# Patient Record
Sex: Female | Born: 1945
Health system: Southern US, Community
[De-identification: ages and names within clinical notes are randomized; demographics above are authoritative.]

## PROBLEM LIST (undated history)

## (undated) DIAGNOSIS — Z860101 Personal history of adenomatous and serrated colon polyps: Secondary | ICD-10-CM

## (undated) DIAGNOSIS — K219 Gastro-esophageal reflux disease without esophagitis: Secondary | ICD-10-CM

## (undated) DIAGNOSIS — K589 Irritable bowel syndrome without diarrhea: Secondary | ICD-10-CM

## (undated) DIAGNOSIS — Z803 Family history of malignant neoplasm of breast: Secondary | ICD-10-CM

## (undated) DIAGNOSIS — M810 Age-related osteoporosis without current pathological fracture: Secondary | ICD-10-CM

## (undated) DIAGNOSIS — E559 Vitamin D deficiency, unspecified: Secondary | ICD-10-CM

## (undated) DIAGNOSIS — T7840XA Allergy, unspecified, initial encounter: Secondary | ICD-10-CM

## (undated) DIAGNOSIS — E785 Hyperlipidemia, unspecified: Secondary | ICD-10-CM

## (undated) DIAGNOSIS — H539 Unspecified visual disturbance: Secondary | ICD-10-CM

## (undated) DIAGNOSIS — N840 Polyp of corpus uteri: Secondary | ICD-10-CM

## (undated) DIAGNOSIS — O021 Missed abortion: Secondary | ICD-10-CM

## (undated) DIAGNOSIS — M1990 Unspecified osteoarthritis, unspecified site: Secondary | ICD-10-CM

## (undated) DIAGNOSIS — K5792 Diverticulitis of intestine, part unspecified, without perforation or abscess without bleeding: Secondary | ICD-10-CM

## (undated) DIAGNOSIS — Z86010 Personal history of colonic polyps: Secondary | ICD-10-CM

## (undated) HISTORY — PX: DILATION AND CURETTAGE OF UTERUS: SHX78

## (undated) HISTORY — DX: Diverticulitis of intestine, part unspecified, without perforation or abscess without bleeding: K57.92

## (undated) HISTORY — PX: DIAGNOSTIC LAPAROSCOPY: SUR761

## (undated) HISTORY — DX: Personal history of colonic polyps: Z86.010

## (undated) HISTORY — PX: WISDOM TOOTH EXTRACTION: SHX21

## (undated) HISTORY — DX: Age-related osteoporosis without current pathological fracture: M81.0

## (undated) HISTORY — DX: Unspecified visual disturbance: H53.9

## (undated) HISTORY — DX: Personal history of adenomatous and serrated colon polyps: Z86.0101

## (undated) HISTORY — DX: Gastro-esophageal reflux disease without esophagitis: K21.9

## (undated) HISTORY — DX: Polyp of corpus uteri: N84.0

## (undated) HISTORY — DX: Family history of malignant neoplasm of breast: Z80.3

## (undated) HISTORY — DX: Allergy, unspecified, initial encounter: T78.40XA

## (undated) HISTORY — PX: COLONOSCOPY: SHX174

## (undated) HISTORY — DX: Hyperlipidemia, unspecified: E78.5

## (undated) HISTORY — DX: Vitamin D deficiency, unspecified: E55.9

---
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
## 1999-11-07 ENCOUNTER — Encounter: Admission: RE | Admit: 1999-11-07 | Discharge: 1999-11-07 | Payer: Self-pay | Admitting: *Deleted

## 1999-11-07 ENCOUNTER — Encounter: Payer: Self-pay | Admitting: *Deleted

## 2000-10-29 ENCOUNTER — Emergency Department (HOSPITAL_COMMUNITY): Admission: EM | Admit: 2000-10-29 | Discharge: 2000-10-30 | Payer: Self-pay | Admitting: Emergency Medicine

## 2000-10-30 ENCOUNTER — Encounter: Payer: Self-pay | Admitting: Emergency Medicine

## 2000-11-11 ENCOUNTER — Encounter: Payer: Self-pay | Admitting: *Deleted

## 2000-11-11 ENCOUNTER — Encounter: Admission: RE | Admit: 2000-11-11 | Discharge: 2000-11-11 | Payer: Self-pay | Admitting: *Deleted

## 2001-11-11 ENCOUNTER — Encounter: Admission: RE | Admit: 2001-11-11 | Discharge: 2001-11-11 | Payer: Self-pay | Admitting: *Deleted

## 2001-11-11 ENCOUNTER — Encounter: Payer: Self-pay | Admitting: *Deleted

## 2002-11-07 ENCOUNTER — Encounter: Admission: RE | Admit: 2002-11-07 | Discharge: 2002-11-07 | Payer: Self-pay | Admitting: *Deleted

## 2002-11-07 ENCOUNTER — Encounter: Payer: Self-pay | Admitting: *Deleted

## 2003-11-12 ENCOUNTER — Encounter: Admission: RE | Admit: 2003-11-12 | Discharge: 2003-11-12 | Payer: Self-pay | Admitting: *Deleted

## 2004-09-24 ENCOUNTER — Ambulatory Visit (HOSPITAL_COMMUNITY): Admission: RE | Admit: 2004-09-24 | Discharge: 2004-09-24 | Payer: Self-pay | Admitting: Gastroenterology

## 2004-09-24 ENCOUNTER — Encounter: Payer: Self-pay | Admitting: Internal Medicine

## 2004-10-23 ENCOUNTER — Other Ambulatory Visit: Admission: RE | Admit: 2004-10-23 | Discharge: 2004-10-23 | Payer: Self-pay | Admitting: *Deleted

## 2004-11-20 ENCOUNTER — Ambulatory Visit (HOSPITAL_COMMUNITY): Admission: RE | Admit: 2004-11-20 | Discharge: 2004-11-20 | Payer: Self-pay | Admitting: *Deleted

## 2005-07-09 ENCOUNTER — Encounter: Admission: RE | Admit: 2005-07-09 | Discharge: 2005-08-25 | Payer: Self-pay | Admitting: Family Medicine

## 2005-11-02 ENCOUNTER — Other Ambulatory Visit: Admission: RE | Admit: 2005-11-02 | Discharge: 2005-11-02 | Payer: Self-pay | Admitting: *Deleted

## 2005-12-10 ENCOUNTER — Ambulatory Visit (HOSPITAL_COMMUNITY): Admission: RE | Admit: 2005-12-10 | Discharge: 2005-12-10 | Payer: Self-pay | Admitting: *Deleted

## 2006-11-03 ENCOUNTER — Other Ambulatory Visit: Admission: RE | Admit: 2006-11-03 | Discharge: 2006-11-03 | Payer: Self-pay | Admitting: *Deleted

## 2007-01-11 ENCOUNTER — Ambulatory Visit (HOSPITAL_COMMUNITY): Admission: RE | Admit: 2007-01-11 | Discharge: 2007-01-11 | Payer: Self-pay | Admitting: Neurology

## 2007-11-07 ENCOUNTER — Other Ambulatory Visit: Admission: RE | Admit: 2007-11-07 | Discharge: 2007-11-07 | Payer: Self-pay | Admitting: *Deleted

## 2007-11-21 ENCOUNTER — Ambulatory Visit (HOSPITAL_COMMUNITY): Admission: RE | Admit: 2007-11-21 | Discharge: 2007-11-21 | Payer: Self-pay | Admitting: *Deleted

## 2008-01-20 ENCOUNTER — Ambulatory Visit (HOSPITAL_COMMUNITY): Admission: RE | Admit: 2008-01-20 | Discharge: 2008-01-20 | Payer: Self-pay | Admitting: *Deleted

## 2008-03-13 LAB — HM DEXA SCAN

## 2008-06-04 DIAGNOSIS — K5730 Diverticulosis of large intestine without perforation or abscess without bleeding: Secondary | ICD-10-CM | POA: Insufficient documentation

## 2008-06-04 DIAGNOSIS — K219 Gastro-esophageal reflux disease without esophagitis: Secondary | ICD-10-CM | POA: Insufficient documentation

## 2008-10-25 ENCOUNTER — Ambulatory Visit: Payer: Self-pay | Admitting: Internal Medicine

## 2008-10-25 DIAGNOSIS — R143 Flatulence: Secondary | ICD-10-CM

## 2008-10-25 DIAGNOSIS — R141 Gas pain: Secondary | ICD-10-CM | POA: Insufficient documentation

## 2008-10-25 DIAGNOSIS — R142 Eructation: Secondary | ICD-10-CM

## 2008-10-29 ENCOUNTER — Ambulatory Visit (HOSPITAL_COMMUNITY): Admission: RE | Admit: 2008-10-29 | Discharge: 2008-10-29 | Payer: Self-pay | Admitting: Internal Medicine

## 2008-10-30 ENCOUNTER — Ambulatory Visit: Payer: Self-pay | Admitting: Internal Medicine

## 2008-10-30 ENCOUNTER — Encounter: Payer: Self-pay | Admitting: Internal Medicine

## 2008-10-30 DIAGNOSIS — R1011 Right upper quadrant pain: Secondary | ICD-10-CM | POA: Insufficient documentation

## 2008-11-01 ENCOUNTER — Encounter: Payer: Self-pay | Admitting: Internal Medicine

## 2008-11-12 ENCOUNTER — Telehealth: Payer: Self-pay | Admitting: Internal Medicine

## 2008-11-20 ENCOUNTER — Ambulatory Visit (HOSPITAL_COMMUNITY): Admission: RE | Admit: 2008-11-20 | Discharge: 2008-11-20 | Payer: Self-pay | Admitting: Internal Medicine

## 2009-06-13 ENCOUNTER — Encounter: Admission: RE | Admit: 2009-06-13 | Discharge: 2009-07-04 | Payer: Self-pay | Admitting: Family Medicine

## 2009-10-01 ENCOUNTER — Encounter (INDEPENDENT_AMBULATORY_CARE_PROVIDER_SITE_OTHER): Payer: Self-pay | Admitting: *Deleted

## 2009-10-02 ENCOUNTER — Ambulatory Visit: Payer: Self-pay | Admitting: Internal Medicine

## 2009-10-16 ENCOUNTER — Ambulatory Visit: Payer: Self-pay | Admitting: Internal Medicine

## 2009-10-22 ENCOUNTER — Telehealth: Payer: Self-pay | Admitting: Internal Medicine

## 2009-10-25 ENCOUNTER — Encounter: Payer: Self-pay | Admitting: Internal Medicine

## 2009-12-14 LAB — HM COLONOSCOPY

## 2010-03-24 ENCOUNTER — Encounter: Admission: RE | Admit: 2010-03-24 | Discharge: 2010-03-24 | Payer: Self-pay | Admitting: Gynecology

## 2010-12-07 ENCOUNTER — Encounter: Payer: Self-pay | Admitting: Gynecology

## 2010-12-07 ENCOUNTER — Encounter: Payer: Self-pay | Admitting: Internal Medicine

## 2010-12-16 NOTE — Miscellaneous (Signed)
Summary: [facility] Previsit/prep  Clinical Lists Changes  Medications: Added new medication of DULCOLAX 5 MG  TBEC (BISACODYL) Day before procedure take 2 at 3pm and 2 at 8pm. - Signed Added new medication of METOCLOPRAMIDE HCL 10 MG  TABS (METOCLOPRAMIDE HCL) As per prep instructions. - Signed Added new medication of MIRALAX   POWD (POLYETHYLENE GLYCOL 3350) As per prep  instructions. - Signed Rx of DULCOLAX 5 MG  TBEC (BISACODYL) Day before procedure take 2 at 3pm and 2 at 8pm.;  #4 x 0;  Signed;  Entered by: Wyona Almas RN;  Authorized by: Hart Carwin MD;  Method [facility]: [facility]  [place] [address] [place], [place], [place]  [address], Ph: [phone] or [phone], Fax: [phone] Rx of METOCLOPRAMIDE HCL 10 MG  TABS (METOCLOPRAMIDE HCL) As per prep instructions.;  #2 x 0;  Signed;  Entered by: Wyona Almas RN;  Authorized by: Hart Carwin MD;  Method used: [facility]  [place] [address], [place], [place]  [address], Ph: [phone] or [phone], Fax: [phone] Rx of MIRALAX   POWD (POLYETHYLENE GLYCOL 3350) As per prep  instructions.;  #255gm x 0;  Signed;  Entered by: Wyona Almas RN;  Authorized by: Hart Carwin MD;  Method used: [facility]  [place] [address] [place], [place], [place]  [address], Ph: [phone] or [phone], Fax: [phone] Observations: Added new observation of ALLERGY REV: Done (10/02/2009 12:44)    Prescriptions: MIRALAX   POWD (POLYETHYLENE GLYCOL 3350) As per prep  instructions.  #255gm x 0   Entered by:   Wyona Almas RN   Authorized by:   Hart Carwin MD   Signed by:   Wyona Almas RN on 10/02/2009   Method used:   [facility]  [place] [address]retail)       [address]       [place], [place]  [address]       Ph: [phone] or [phone]       Fax: [phone]   RxID:   [id number] METOCLOPRAMIDE HCL 10 MG  TABS (METOCLOPRAMIDE HCL) As per prep instructions.   #2 x 0   Entered by:   Wyona Almas RN   Authorized by:   Hart Carwin MD   Signed by:   Wyona Almas RN on 10/02/2009   Method used:   [facility]        [facility]  [place] [address] [facility]ail[facility]       [address]       [place], [place]  [address]       Ph: [phone] or [phone]       Fax: [phone]   RxID:   [id number] DULCOLAX 5 MG  TBEC (BISACODYL) Day before procedure take 2 at 3pm and 2 at 8pm.  #4 x 0   Entered by:   Wyona Almas RN   Authorized by:   Hart Carwin MD   Signed by:   Wyona Almas RN on 10/02/2009   Method used:   [facility]  [place] [address]ail[facility]       [address]       [place], [place]  [address]       Ph: [phone] or [phone]  Fax: [phone]   RxID:   [id number]

## 2010-12-16 NOTE — Procedures (Signed)
Summary: COLON   Colonoscopy  Procedure date:  09/24/2004  Findings:      Location:  [facility].    Procedures Next Due Date:    Colonoscopy: 09/2009  PHYSICIAN:  Fayrene Fearing L. Malon Kindle., M.D.DATE OF BIRTH:  04/13/1946   DATE OF PROCEDURE:  09/24/2004  DATE OF DISCHARGE:                                 OPERATIVE REPORT   PROCEDURE:  Colonoscopy.   MEDICATIONS:  1.  Fentanyl 100 mcg.  2.  Versed 8.5 mg IV.   SCOPE:  Olympus pediatric adjustable colonoscope.   INDICATION:  Strong family history of colon polyps.   DESCRIPTION OF PROCEDURE:  The procedure had been explained to the patient  and consent obtained.  The patient in left lateral decubitus position, the  Olympus scope was inserted and advanced.  The prep was excellent.  We were  able to reach the cecum without difficulty.  The ileocecal valve and  appendiceal orifice were seen.  The scope was withdrawn, and the cecum,  ascending colon, transverse colon, splenic flexure, descending colon were  seen well.  Extensive diverticular disease in the sigmoid colon.  In fact,  it had taken [place] a bit of difficulty getting through but once we passed this  area, we were able to advance easily to the cecum.  No polyps were seen  throughout.  The sigmoid colon was free of polyps.  The scope withdrawn.  The patient tolerated the procedure well.   ASSESSMENT:  1.  Severe diverticulosis of the sigmoid.  562.10.  2.  Family history of colon polyps.  V19.8.   PLAN:  We will recommend yearly Hemoccults and repeat colonoscopy in 5  years.      JLE/MEDQ  D:  09/24/2004  T:  09/24/2004  Job:  [id number]   cc:   Caryn Bee L. Little, M.D.  [address]  [place]vin Kn[place]  [place]  [place] [address]  Fax: [phone]  This report was created from the original endoscopy report, which was reviewed and signed by the above listed endoscopist.

## 2010-12-16 NOTE — Procedures (Signed)
Summary: Colonoscopy  Patient: Shelly [name] Note: All result statuses are Final unless otherwise noted.  Tests: (1) Colonoscopy (COL)   COL Colonoscopy           DONE     [facility]     [address]     [place], [place]  [address]           COLONOSCOPY PROCEDURE REPORT           PATIENT:  Shelly [name], Shelly [name]  MR#:  [id number]     BIRTHDATE:  1946-04-26, 62 yrs. old  GENDER:  female           ENDOSCOPIST:  Hedwig Morton. Juanda Chance, MD     Referred by:  Herb Grays, M.D.           PROCEDURE DATE:  10/16/2009     PROCEDURE:  Colonoscopy [address]     ASA CLASS:  Class I     INDICATIONS:  family Hx of polyps last colon 2005           MEDICATIONS:   Versed 8 mg, Fentanyl 75 mcg           DESCRIPTION OF PROCEDURE:   After the risks benefits and     alternatives of the procedure were thoroughly explained, informed     consent was obtained.  Digital rectal exam was performed and     revealed no rectal masses.   The LB CF-H180AL [id number] endoscope     was introduced through the anus and advanced to the cecum, which     was identified by both the appendix and ileocecal valve, without     limitations.  The quality of the prep was good, using MiraLax.     The instrument was then slowly withdrawn as the colon was fully     examined.     <<PROCEDUREIMAGES>>           FINDINGS:  Three polyps were found sigmoid to descending 3 sessile     polyps 5 mm each, at 60 cm and 15 cm Polyps were snared without     cautery. Retrieval was successful (see image5, image6, image9, and     image10). snare polyp  Moderate diverticulosis was found (see     image8, image7, and image1).  This was otherwise a normal     examination of the colon (see image2, image3, and image4).     Retroflexed views in the rectum revealed no abnormalities.    The     scope was then withdrawn from the patient and the procedure     completed.           COMPLICATIONS:  None           ENDOSCOPIC IMPRESSION:     1) Three polyps  in the sigmoid to descending     2) Moderate diverticulosis     3) Otherwise normal examination     s/p cold snare polypectomies     RECOMMENDATIONS:     1) Await pathology results           REPEAT EXAM:  In 5 year(s) for.           ______________________________     Hedwig Morton. Juanda Chance, MD           CC:           n.     eSIGNED:   Hedwig Morton. Brodie at 10/16/2009 10:24 AM  Laniesha, Das, [phone]  Note: An exclamation mark (!) indicates a result that was not dispersed into the flowsheet. Document Creation Date: 10/16/2009 10:24 AM _______________________________________________________________________  (1) Order result status: Final Collection or observation date-time: 10/16/2009 10:16 Requested date-time:  Receipt date-time:  Reported date-time:  Referring Physician:   Ordering Physician: Lina Sar [phone]) Specimen Source:  Source: Launa Grill Order Number: [id number] Lab site:   Appended Document: Colonoscopy     Procedures Next Due Date:    Colonoscopy: 10/2014

## 2010-12-16 NOTE — Miscellaneous (Signed)
Summary: Hida Scan  Clinical Lists Changes  Problems: Added new problem of ABDOMINAL PAIN RIGHT UPPER QUADRANT (ICD-789.01) Orders: Added new Referral order of HIDA Scan (HIDA SCAN) - Signed

## 2010-12-16 NOTE — Letter (Signed)
Summary: Patient Notice- Polyp Results  [facility]  [address] [place], [place] [address]   Phone: [phone]  Fax: [phone]        October 25, 2009 MRN: [id number]    Shelly [name] [address] [place], [place]  [address]    Dear [name],  I am pleased to inform you that the colon polyp(s) removed during your recent colonoscopy was (were) found to be benign (no cancer detected) upon pathologic examination.The polyps were adenomatous ( precancerous).  I recommend you have a repeat colonoscopy examination in 5_ years to look for recurrent polyps, as having colon polyps increases your risk for having recurrent polyps or even colon cancer in the future.  Should you develop new or worsening symptoms of abdominal pain, bowel habit changes or bleeding from the rectum or bowels, please schedule an evaluation with either your primary care physician or with me.  Additional information/recommendations:  _x_ No further action with gastroenterology is needed at this time. Please      follow-up with your primary care physician for your other healthcare      needs.  __ Please call [phone] to schedule a return visit to review your      situation.  __ Please keep your follow-up visit as already scheduled.  __ Continue treatment plan as outlined the day of your exam.  Please call us if you are having persistent problems or have questions about your condition that have not been fully answered at this time.  Sincerely,  Hart Carwin MD  This letter has been electronically signed by your physician.  Appended Document: Patient Notice- Polyp Results Letter mailed 12.14.10

## 2010-12-16 NOTE — Progress Notes (Signed)
Summary: sch test  Phone Note Call from Patient Call back at [phone]   Caller: Patient Call For: BRODIE  Reason for Call: Talk to Nurse Details for Reason: sch test Summary of Call: need to sch 2nd testing @ hosp Initial call taken by: Guadlupe Spanish [facility],  November 12, 2008 11:21 AM  Follow-up for Phone Call        Patient states that she was supposed to have her HIDA scan without CCK done on 11/02/08 but couldnt due to the weather.  Patient would like to reschedule.  I have rescheduled the patient for HIDA without CCK for 11-20-08 @ 8:00am [facility]. Follow-up by: Hortense Ramal CMA,  November 12, 2008 12:48 PM

## 2010-12-16 NOTE — Procedures (Signed)
Summary: endoscopy   ENDOSCOPY PROCEDURE REPORT  PATIENT:  Shelly, [name]  MR#:  [id number] BIRTHDATE:   [birth year]   GENDER:   female  ENDOSCOPIST:   Hedwig Morton. Juanda Chance, MD Referred by: Herb Grays, M.D.  PROCEDURE DATE:  10/30/2008 PROCEDURE:  EGD with biopsy ASA CLASS:   Class I INDICATIONS: heartburn burning in throat, mouth  MEDICATIONS:    Versed 9 mg IV, Fentanyl 50 mcg IV TOPICAL ANESTHETIC:   Exactacain Spray  DESCRIPTION OF PROCEDURE:   After the risks benefits and alternatives of the procedure were thoroughly explained, informed consent was obtained.  The LB GIF-H180 [id number] endoscope was introduced through the mouth and advanced to the second portion of the duodenum, without limitations.  The instrument was slowly withdrawn as the mucosa was fully examined. <<IMAGES HERE>>  A hiatal hernia was found. 3 cm Hiatal hernia With standard forceps, a biopsy was obtained and sent to pathology.  The duodenal bulb was normal in appearance, as was the postbulbar duodenum.  The stomach was entered and closely examined. The antrum, angularis, and lesser curvature were well visualized, including a retroflexed view of the cardia and fundus. The stomach wall was normally distensable. The scope passed easily through the pylorus into the duodenum.    Retroflexed views revealed no abnormalities.    The scope was then withdrawn from the patient and the procedure completed.  COMPLICATIONS:   None  ENDOSCOPIC IMPRESSION:  1) Hiatal hernia  2) Normal duodenum  3) Normal stomach  2-3 cm hiatal hernia RECOMMENDATIONS:  1) Nexium 40 mg  REPEAT EXAM:   No   _______________________________ Hedwig Morton. Juanda Chance, MD   CC: Shelly [name]     REPORT OF SURGICAL PATHOLOGY   Case #: [id number] Patient Name: [name], [name] Office Chart Number:  N/A   MRN: [id number] Pathologist: Havery Moros, MD DOB/Age  [birth year] (Age: 65)    Gender: F Date Taken:  10/30/2008 Date Received:  10/31/2008   FINAL DIAGNOSIS   ***MICROSCOPIC EXAMINATION AND DIAGNOSIS***   ESOPHAGUS:  FINDINGS CONSISTENT WITH GASTROESOPHAGEAL REFLUX.  NO INTESTINAL METAPLASIA, DYSPLASIA OR MALIGNANCY IDENTIFIED.   COMMENT A  PAS stain for fungi is negative.  The control stained appropriately.  An Alcian Blue stain is performed to determine the presence of intestinal metaplasia (goblet cell metaplasia). No intestinal metaplasia (goblet cell metaplasia) is identified with the Alcian Blue stain. The control stained appropriately.  (BNS:jy) 11/01/08   jy Date Reported:  11/01/2008     Havery Moros, MD *** Electronically Signed Out By BNS ***    November 01, 2008 MRN: [id number]    Shelly [name] [address] [place], [place]  [address]    Dear Shelly [name],  I am pleased to inform you that the biopsies taken during your recent endoscopic examination did not show any evidence of cancer upon pathologic examination.The biopsies show mild inflammation due to reflux  Additional information/recommendations:    _x_ Continue with the treatment plan as outlined on the day of your      exam.     Please call us if you are having persistent problems or have questions about your condition that have not been fully answered at this time.  Sincerely,  Hart Carwin MD  This letter has been electronically signed by your physician.   Signed by Hart Carwin MD on 11/01/2008 at 8:52 PM  ________________________________________________________________________ This report was created from the original endoscopy report, which was reviewed and signed by the above listed  endoscopist.

## 2010-12-16 NOTE — Progress Notes (Signed)
Summary: TRIAGE  Phone Note Call from Patient Call back at Home Phone [phone]   Caller: Patient Call For: Juanda Chance Reason for Call: Talk to Nurse Summary of Call: Patient has acid reflux would like an rx called in to [facility] [place] (med she had has expired Librax) Initial call taken by: Tawni Levy,  October 22, 2009 8:52 AM  Follow-up for Phone Call        Pt. should be on Nexium for GERD. She states it makes her stomache hurt, so she takes Tagamet OTC. Does have episodes of increased GERD. Pt. also c/o episodes of left side sorness/discomfort, wants a refill of Librax.   1) Omeprazole 40mg  QAM 2) Librax as directed 3) Soft,bland diet. No spicy,greasy,fried foods. Advance diet as tolerated. 4) Keep appt. with Dr.Carlas Vandyne on 11-18-09 at 9:30am 5) If symptoms become worse call back immediately.  Follow-up by: Laureen Ochs LPN,  October 22, 2009 9:21 AM  Additional Follow-up for Phone Call Additional follow up Details #1::        Reviewed and agree. Additional Follow-up by: Hart Carwin MD,  October 22, 2009 1:28 PM    New/Updated Medications: OMEPRAZOLE 40 MG  CPDR (OMEPRAZOLE) 1 each day 30 minutes before breakfast. LIBRAX 2.5-5 MG  CAPS (CLIDINIUM-CHLORDIAZEPOXIDE) Take one 3 times daily as needed for abd.pain/cramping. Prescriptions: LIBRAX 2.5-5 MG  CAPS (CLIDINIUM-CHLORDIAZEPOXIDE) Take one 3 times daily as needed for abd.pain/cramping.  #60 x 1   Entered by:   Laureen Ochs LPN   Authorized by:   Hart Carwin MD   Signed by:   Laureen Ochs LPN on 09/81/1914   Method used:   [facility]  [place] [address]       [address]       [place], [place]  [address]       Ph: [phone] or [phone]       Fax: [phone]   RxID:   [id number] OMEPRAZOLE 40 MG  CPDR (OMEPRAZOLE) 1 each day 30 minutes before breakfast.  #30 x 2   Entered by:   Laureen Ochs LPN   Authorized by:   Hart Carwin MD   Signed by:    Laureen Ochs LPN on 63/87/5643   Method used:   [facility]  [place] [address] [facility]       [address]       [place], [place]  [address]       Ph: [phone] or [phone]       Fax: [phone]   RxID:   [id number]

## 2010-12-16 NOTE — Miscellaneous (Signed)
Summary: Nexium RX  Clinical Lists Changes  Medications: Added new medication of NEXIUM 40 MG  CPDR (ESOMEPRAZOLE MAGNESIUM) 1 capsule each day 30 minutes before meal - Signed Rx of NEXIUM 40 MG  CPDR (ESOMEPRAZOLE MAGNESIUM) 1 capsule each day 30 minutes before meal;  #30 x 3;  Signed;  Entered by: Durwin Glaze RN;  Authorized by: Hart Carwin MD;  Method used[facility]  [place] [address], [place], [place]  [address], Ph: [phone] or [phone], Fax: [phone]    Prescriptions: NEXIUM 40 MG  CPDR (ESOMEPRAZOLE MAGNESIUM) 1 capsule each day 30 minutes before meal  #30 x 3   Entered by:   Durwin Glaze RN   Authorized by:   Hart Carwin MD   Signed by:   Durwin Glaze RN on 10/30/2008   Method used:   [facility]  [place] [address]       [address]       [place], [place]  [address]       Ph: [phone] or [phone]       Fax: [phone]   RxID:   [id number]

## 2010-12-16 NOTE — Letter (Signed)
Summary: [facility] Instructions  [facility]  [address] [place], [place] [address]   Phone: [phone]  Fax: [phone]       Karalyn Barreiro    1946/02/08    MRN: [id number]       Procedure Day Dorna Bloom: [facility]  10/16/09     Arrival Time: 8:30AM     Procedure Time: 9:30AM     Location of Procedure:                    Juliann Pares  [facility] (4th Floor)   PREPARATION FOR COLONOSCOPY WITH MIRALAX  Starting 5 days prior to your procedure 10/11/09 do not eat nuts, seeds, popcorn, corn, beans, peas,  salads, or any raw vegetables.  Do not take any fiber supplements (e.g. Metamucil, Citrucel, and Benefiber). ____________________________________________________________________________________________________   THE DAY BEFORE YOUR PROCEDURE     DATE:10/15/09 DAY: TUESDAY  1   Drink clear liquids the entire day-NO SOLID FOOD  2   Do not drink anything colored red or purple.  Avoid juices with pulp.  No orange juice.  3   Drink at least 64 oz. (8 glasses) of fluid/clear liquids during the day to prevent dehydration and help the prep work efficiently.  CLEAR LIQUIDS INCLUDE: Water Jello Ice Popsicles Tea (sugar ok, no milk/cream) Powdered fruit flavored drinks Coffee (sugar ok, no milk/cream) Gatorade Juice: apple, white grape, white cranberry  Lemonade Clear bullion, consomm, broth Carbonated beverages (any kind) Strained chicken noodle soup Hard Candy  4   Mix the entire bottle of Miralax with 64 oz. of Gatorade/Powerade in the morning and put in the refrigerator to chill.  5   At 3:00 pm take 2 Dulcolax/Bisacodyl tablets.  6   At 4:30 pm take one Reglan/Metoclopramide tablet.  7  Starting at 5:00 pm drink one 8 oz glass of the Miralax mixture every 15-20 minutes until you have finished drinking the entire 64 oz.  You should finish drinking prep around 7:30 or 8:00 pm.  8   If you are nauseated, you may take the 2nd Reglan/Metoclopramide tablet  at 6:30 pm.        9    At 8:00 pm take 2 more DULCOLAX/Bisacodyl tablets.     THE DAY OF YOUR PROCEDURE      DATE:  10/16/09 DAY: Lulu Riding  You may drink clear liquids until 7:30AM  (2 HOURS BEFORE PROCEDURE).   MEDICATION INSTRUCTIONS  Unless otherwise instructed, you should take regular prescription medications with a small sip of water as early as possible the morning of your procedure.           OTHER INSTRUCTIONS  You will need a responsible adult at least 67 years of age to accompany you and drive you home.   This person must remain in the waiting room during your procedure.  Wear loose fitting clothing that is easily removed.  Leave jewelry and other valuables at home.  However, you may wish to bring a book to read or an iPod/MP3 player to listen to music as you wait for your procedure to start.  Remove all body piercing jewelry and leave at home.  Total time from sign-in until discharge is approximately 2-3 hours.  You should go home directly after your procedure and rest.  You can resume normal activities the day after your procedure.  The day of your procedure you should not:   Drive   Make legal decisions   Operate machinery  Drink alcohol   Return to work  You will receive specific instructions about eating, activities and medications before you leave.   The above instructions have been reviewed and explained to me by   Wyona Almas RN  October 02, 2009 1:19 PM     I fully understand and can verbalize these instructions _____________________________ Date _______

## 2010-12-16 NOTE — Assessment & Plan Note (Signed)
Summary: HAVING SOME GI ISSUES/YF   History of Present Illness Visit Type: 65 year old white female for self referred with increased symptoms of burning in the upper chest and in her mouth and throat of one year duration she has tried some antacids and AcipHex 20 mg once a day but has discontinued because of urinary symptoms. She has a family history Primary GI MD: Lina Sar MD Primary Provider: Herb Grays, MD Chief Complaint: Acid reflux with sore throat for past year. Has tried Aciphex and other anitreflux meds  but caused diarrhea and abdominal cramps.  History of Present Illness:   65 year old white female with the burning in her mouth and throat of one year duration which  wakes her up at night. She also has dyspepsia and some burning substernally she denies any dysphagia to solids or liquids she coughs at night but she denies hoarseness. Patient's stopped smoking one year ago. She has tried  Tagamet with some improvement in burning.Marland Kitchen AcipHex and Prevacid caused diarrhea and urinary symptoms and were discontinued. She takes Maalox p.r.n. Patient has a history of gastric ulcer which I  diagnosed in 1987 after she took  excess  Motrin .She was treated for H. pylori about 8 years ago while living in [place]. Her mother had the ulcer and ultimately gastric cancer; Marland KitchenShe also had gallbladder disease.  Gallbladder disease was also found in two maternal aunts.. Her last colonoscopy done by Dr. Randa Evens in 2005 showed severe diverticulosis of the sigmoid colon. She does have a family history of colon polyps and is due for a repeat colonoscopy in November 2010.   GI Review of Systems    Reports acid reflux, belching, bloating, and  nausea.      Denies abdominal pain, chest pain, dysphagia with liquids, dysphagia with solids, heartburn, loss of appetite, vomiting, vomiting blood, weight loss, and  weight gain.      Reports diverticulosis and  irritable bowel syndrome.     Denies anal fissure, black  tarry stools, change in bowel habit, constipation, diarrhea, fecal incontinence, heme positive stool, hemorrhoids, jaundice, light color stool, liver problems, rectal bleeding, and  rectal pain.     Prior Medications Reviewed Using: Patient Recall  Updated Prior Medication List: No Medications Current Allergies (reviewed today): ! SULFA  Past Medical History:    Reviewed history from 06/04/2008 and no changes required:       Current Problems:        DIVERTICULOSIS OF COLON (ICD-562.10)       GERD (ICD-530.81)         Past Surgical History:    Tonsillectomy    Ovarian Cyst removed (laproscopic)   Family History:    Reviewed history and no changes required:       Family History of Colon Polyps:       Family History of Heart Disease:        Family History of Irritable Bowel Syndrome:  Social History:    Reviewed history and no changes required:       Married with 1 daughter       Patient is a former smoker.        Alcohol Use - yes       Daily Caffeine Use       Illicit Drug Use - no   Risk Factors:  Tobacco use:  quit Drug use:  no Alcohol use:  yes   Review of Systems       The patient complains of allergy/sinus, anxiety-new,  sleeping problems, and sore throat.         Pertinent positive and negative review of systems were noted in the above HPI. All other ROS was otherwise negative.    Vital Signs:  Patient Profile:   65 Years Old Female Height:     65 inches Weight:      160.13 pounds BMI:     26.74 BSA:     1.80 Pulse rate:   76 / minute Pulse rhythm:   regular BP sitting:   132 / 68  (right arm) Cuff size:   regular  Vitals Entered By: Teresita Madura CMA (October 25, 2008 3:58 PM)                  Physical Exam  General:     healthy-appearing, no hoarseness, no cough, normal voice. Mouth:     mucosa is normal with normal populated home. Uvula is mildly erythematous but there is no exudate Neck:     Supple; no masses or  thyromegaly. Lungs:     Clear throughout to auscultation. Heart:     Regular rate and rhythm; no murmurs, rubs,  or bruits. Abdomen:     soft abdomen with normoactive bowel sounds, no tenderness, liver edge at costal margin, lower abdomen is unremarkable, no palpable mass. Rectal:     rectal tone is normal. stool is hemoccult negative. Extremities:     No clubbing, cyanosis, edema or deformities noted.    Impression & Recommendations:  Problem # 1:  GERD (ICD-530.81) burning in her mouth and throat possibly related to gastroesophageal reflux. We need to rule out allergies and non gastric sources. Patient is a nonsmoker. She may need an ear, nose, throat evaluation following her GI evaluation. We will schedule her for an upper endoscopy to evaluate her symptoms. We will check for H. pylori or recurrent gastric ulcer. Patient was given Nexium samples to take 40 mg daily. Orders: EGD (EGD)   Problem # 2:  DIVERTICULOSIS OF COLON (ICD-562.10) family history of colon polyps and personal history of diverticulosis. Last colonoscopy was in November 2005. Her recall colonoscopy is due in November 2010.  Other Orders: Ultrasound Abdomen (UAS)   Patient Instructions: 1)  upper abdominal ultrasound 2)  Upper endoscopy 3)  Nexium 40 mg daily 4)  Antireflux measures 5)  Okay to use antacids  6)  Possible referral to ear, nose, throat specialist 7)  Copy Sent To:Dr T.Pam.Bevel    ]

## 2010-12-16 NOTE — Letter (Signed)
Summary: Patient Shelly [name]  [facility]  [address] [place], [place] [address]   Phone: [phone]  Fax: [phone]        November 01, 2008 MRN: [id number]    Shelly [name] [address] [place], [place]  [address]    Dear Ms. Syverson,  I am pleased to inform you that the biopsies taken during your recent endoscopic examination did not show any evidence of cancer upon pathologic examination.The biopsies show mild inflammation due to reflux  Additional information/recommendations:    _x_ Continue with the treatment plan as outlined on the day of your      exam.     Please call us if you are having persistent problems or have questions about your condition that have not been fully answered at this time.  Sincerely,  Hart Carwin MD  This letter has been electronically signed by your physician.

## 2011-03-27 ENCOUNTER — Ambulatory Visit
Admission: RE | Admit: 2011-03-27 | Discharge: 2011-03-27 | Disposition: A | Discharge status: Home / Self Care | Payer: Medicare Other | Source: Ambulatory Visit | Attending: Gynecology | Admitting: Gynecology

## 2011-03-27 ENCOUNTER — Ambulatory Visit: Payer: Self-pay

## 2011-03-27 ENCOUNTER — Other Ambulatory Visit: Payer: Self-pay | Admitting: Gynecology

## 2011-03-27 DIAGNOSIS — Z139 Encounter for screening, unspecified: Secondary | ICD-10-CM

## 2011-03-27 DIAGNOSIS — Z1231 Encounter for screening mammogram for malignant neoplasm of breast: Secondary | ICD-10-CM

## 2011-04-03 NOTE — Op Note (Signed)
NAME:  Shelly [name], Shelly [name]              ACCOUNT NO.:  [id number]   MEDICAL RECORD NO.:  [id number]          PATIENT TYPE:  AMB   LOCATION:  ENDO                         FACILITY:  [facility]   PHYSICIAN:  James L. Malon Kindle., M.D.DATE OF BIRTH:  [birth year]   DATE OF PROCEDURE:  09/24/2004  DATE OF DISCHARGE:                                 OPERATIVE REPORT   PROCEDURE:  Colonoscopy.   MEDICATIONS:  1.  Fentanyl 100 mcg.  2.  Versed 8.5 mg IV.   SCOPE:  Olympus pediatric adjustable colonoscope.   INDICATION:  Strong family history of colon polyps.   DESCRIPTION OF PROCEDURE:  The procedure had been explained to the patient  and consent obtained.  The patient in left lateral decubitus position, the  Olympus scope was inserted and advanced.  The prep was excellent.  We were  able to reach the cecum without difficulty.  The ileocecal valve and  appendiceal orifice were seen.  The scope was withdrawn, and the cecum,  ascending colon, transverse colon, splenic flexure, descending colon were  seen well.  Extensive diverticular disease in the sigmoid colon.  In fact,  it had taken [place] a bit of difficulty getting through but once we passed this  area, we were able to advance easily to the cecum.  No polyps were seen  throughout.  The sigmoid colon was free of polyps.  The scope withdrawn.  The patient tolerated the procedure well.   ASSESSMENT:  1.  Severe diverticulosis of the sigmoid.  562.10.  2.  Family history of colon polyps.  V19.8.   PLAN:  We will recommend yearly Hemoccults and repeat colonoscopy in 5  years.      JLE/MEDQ  D:  09/24/2004  T:  09/24/2004  Job:  [id number]   cc:   Caryn Bee L. Little, M.D.  [address]  [place]  [place] [address]  Fax: [phone]

## 2011-04-14 LAB — HM MAMMOGRAPHY

## 2011-04-14 LAB — HM PAP SMEAR: HM Pap smear: NORMAL

## 2011-05-15 ENCOUNTER — Encounter: Payer: Self-pay | Admitting: Family Medicine

## 2011-05-15 ENCOUNTER — Ambulatory Visit (INDEPENDENT_AMBULATORY_CARE_PROVIDER_SITE_OTHER): Payer: Medicare Other | Admitting: Family Medicine

## 2011-05-15 VITALS — BP 140/78 | HR 72 | Temp 98.0°F | Resp 12 | Ht 65.25 in | Wt 164.0 lb

## 2011-05-15 DIAGNOSIS — M65849 Other synovitis and tenosynovitis, unspecified hand: Secondary | ICD-10-CM

## 2011-05-15 DIAGNOSIS — M65839 Other synovitis and tenosynovitis, unspecified forearm: Secondary | ICD-10-CM

## 2011-05-15 DIAGNOSIS — M25519 Pain in unspecified shoulder: Secondary | ICD-10-CM

## 2011-05-15 DIAGNOSIS — M778 Other enthesopathies, not elsewhere classified: Secondary | ICD-10-CM

## 2011-05-15 NOTE — Progress Notes (Signed)
  Subjective:    Patient ID: Shelly [name], female    DOB: 11-12-1946, 65 y.o.   MRN: [id number]  HPI New patient to establish care. Here for acute issue. Past medical history reviewed. Report of hyperlipidemia. History of GERD. Colon polyps. Reported diverticular disease. No clear history of diverticulitis. Surgical history significant for laparoscopy for cyst of ovary. Tonsillectomy 1952. No regular medications. Allergy to sulfa and Advil.  Family history significant for father lung cancer. Mother had heart disease and hypertension. Parents also had diabetes.  Patient is married. Nonsmoker. 2 glasses of wine per day.  Acute issue is left wrist left and shoulder pain. No injury. Shoulder pain for approximately 2 months and that is actually improving. Associated lower neck pain. Good range of motion. No definite weakness. Left wrist hurts along the radial aspect. No injury. No repetitive activities. Has tried minimal icing without much improvement. Occasional ibuprofen.  R hand dominant.   Review of Systems  Constitutional: Negative for fever, diaphoresis, appetite change and unexpected weight change.  Respiratory: Negative for cough and shortness of breath.   Cardiovascular: Negative for chest pain, palpitations and leg swelling.  Gastrointestinal: Negative for abdominal pain.  Musculoskeletal: Negative for myalgias and gait problem.  Neurological: Negative for headaches.  Hematological: Negative for adenopathy. Does not bruise/bleed easily.       Objective:   Physical Exam  Constitutional: She is oriented to person, place, and time. She appears well-developed and well-nourished.  Cardiovascular: Normal rate, regular rhythm and normal heart sounds.   No murmur heard. Pulmonary/Chest: Effort normal and breath sounds normal. No respiratory distress. She has no wheezes. She has no rales.  Musculoskeletal: She exhibits no edema.       Left shoulder reveals good range of motion. Full  strength. Left wrist reveals good range of motion. Chest some tenderness along the abductor tendon of the filum. No visible swelling. Minimal crepitus. No erythema. No sign negative.  Neurological: She is alert and oriented to person, place, and time.          Assessment & Plan:  #1 tendinitis left wrist.  DeQuervain's tenosynovitis.  Discussed risks and benefits of injection corticosteroid. Has already tried some icing without improvement. Prep skin with Betadine. Using 5/8 inch 25 gauge needle 20 mg Depo-Medrol injected parallel to the tendon sheath. Patient tolerated well. Continue icing 2-3 g daily and touch base 2-3 weeks if no better #2 left shoulder pain. Suspect rotator cuff tendinitis. Continue range of motion exercises. Consider corticosteroid injection possible physical therapy if not continuing to resolve

## 2011-05-16 ENCOUNTER — Encounter: Payer: Self-pay | Admitting: Family Medicine

## 2011-06-01 ENCOUNTER — Encounter: Payer: Self-pay | Admitting: Family Medicine

## 2011-06-01 ENCOUNTER — Ambulatory Visit (INDEPENDENT_AMBULATORY_CARE_PROVIDER_SITE_OTHER): Payer: Medicare Other | Admitting: Family Medicine

## 2011-06-01 VITALS — BP 130/78 | Temp 98.3°F | Wt 162.0 lb

## 2011-06-01 DIAGNOSIS — M25539 Pain in unspecified wrist: Secondary | ICD-10-CM

## 2011-06-01 NOTE — Progress Notes (Signed)
  Subjective:    Patient ID: Shelly [name], female    DOB: 04-Feb-1946, 65 y.o.   MRN: [id number]  HPI Patient seen with persistent left wrist pain. She had some thumb tendon soreness and suspected de Quervain's tenosynovitis. Left wrist injected with corticosteroid but minimal improvement. She now complains of a sharp pain which radiates from the radial aspect of the wrist of sometimes proximal to the elbow. No neck pain. Pain is very fleeting lasting a few seconds and brought on by supination of the wrist. She has some mild swelling. Denies history of recent trauma. No erythema. No ecchymosis. Denies weakness or numbness.  Ice and antiinflammatories without improvement.  She's had some left shoulder pain which is actually improving. No weakness. Good range of motion.   Review of Systems  Constitutional: Negative for fever, chills and fatigue.  Skin: Negative for rash.  Neurological: Negative for weakness.  Hematological: Negative for adenopathy. Does not bruise/bleed easily.       Objective:   Physical Exam  Constitutional: She appears well-developed and well-nourished.  Cardiovascular: Normal rate and regular rhythm.   Pulmonary/Chest: Effort normal and breath sounds normal. No respiratory distress. She has no wheezes.  Musculoskeletal:       Left wrist reveals very mild edema distally.  No bony tenderness. No erythema or warmth.  Full ROM wrist.  Neurological:       No sensory impairment of hand or wrist. Full-strength with grip.  Deep tendon reflexes 2+ upper extremities  Skin: No rash noted. No erythema.          Assessment & Plan:  Persistent left wrist pain. By description this is sharp and fleeting and sounds more neuropathic. Recommend referral to hand surgery specialist.  We discussed getting some x-rays if she is able to get in soon to see hand specialist soon.

## 2011-06-04 ENCOUNTER — Ambulatory Visit (INDEPENDENT_AMBULATORY_CARE_PROVIDER_SITE_OTHER): Payer: Medicare Other | Admitting: Family Medicine

## 2011-06-04 ENCOUNTER — Encounter: Payer: Self-pay | Admitting: Family Medicine

## 2011-06-04 VITALS — BP 130/78 | Temp 98.8°F | Wt 168.0 lb

## 2011-06-04 DIAGNOSIS — Z0000 Encounter for general adult medical examination without abnormal findings: Secondary | ICD-10-CM

## 2011-06-04 DIAGNOSIS — Z23 Encounter for immunization: Secondary | ICD-10-CM

## 2011-06-04 LAB — LIPID PANEL
Cholesterol: 273 mg/dL — ABNORMAL HIGH (ref 0–200)
HDL: 61.3 mg/dL (ref >39.00)
Total CHOL/HDL Ratio: 4
Triglycerides: 123 mg/dL (ref 0.0–149.0)
VLDL: 24.6 mg/dL (ref 0.0–40.0)

## 2011-06-04 LAB — BASIC METABOLIC PANEL
BUN: 18 mg/dL (ref 6–23)
CO2: 28 mEq/L (ref 19–32)
Calcium: 9.2 mg/dL (ref 8.4–10.5)
Chloride: 103 mEq/L (ref 96–112)
Creatinine, Ser: 0.8 mg/dL (ref 0.4–1.2)
GFR: 80.78 mL/min (ref >60.00)
Glucose, Bld: 109 mg/dL — ABNORMAL HIGH (ref 70–99)
Potassium: 4.5 mEq/L (ref 3.5–5.1)
Sodium: 141 mEq/L (ref 135–145)

## 2011-06-04 LAB — CBC WITH DIFFERENTIAL/PLATELET
Basophils Absolute: 0 10*3/uL (ref 0.0–0.1)
Basophils Relative: 0.6 % (ref 0.0–3.0)
Eosinophils Absolute: 0 10*3/uL (ref 0.0–0.7)
Eosinophils Relative: 0.5 % (ref 0.0–5.0)
HCT: 42.5 % (ref 36.0–46.0)
Hemoglobin: 14.4 g/dL (ref 12.0–15.0)
Lymphocytes Relative: 32.9 % (ref 12.0–46.0)
Lymphs Abs: 2.1 10*3/uL (ref 0.7–4.0)
MCHC: 33.9 g/dL (ref 30.0–36.0)
MCV: 89.3 fl (ref 78.0–100.0)
Monocytes Absolute: 0.5 10*3/uL (ref 0.1–1.0)
Monocytes Relative: 7.1 % (ref 3.0–12.0)
Neutro Abs: 3.8 10*3/uL (ref 1.4–7.7)
Neutrophils Relative %: 58.9 % (ref 43.0–77.0)
Platelets: 307 10*3/uL (ref 150.0–400.0)
RBC: 4.76 Mil/uL (ref 3.87–5.11)
RDW: 13.9 % (ref 11.5–14.6)
WBC: 6.5 10*3/uL (ref 4.5–10.5)

## 2011-06-04 LAB — HEPATIC FUNCTION PANEL
ALT: 21 U/L (ref 0–35)
AST: 18 U/L (ref 0–37)
Albumin: 4.5 g/dL (ref 3.5–5.2)
Alkaline Phosphatase: 80 U/L (ref 39–117)
Bilirubin, Direct: 0.1 mg/dL (ref 0.0–0.3)
Total Bilirubin: 0.7 mg/dL (ref 0.3–1.2)
Total Protein: 7.8 g/dL (ref 6.0–8.3)

## 2011-06-04 LAB — LDL CHOLESTEROL, DIRECT: Direct LDL: 215.9 mg/dL

## 2011-06-04 LAB — TSH: TSH: 1.06 u[IU]/mL (ref 0.35–5.50)

## 2011-06-04 NOTE — Progress Notes (Signed)
Subjective:    Patient ID: Shelly [name], female    DOB: [birth year], 65 y.o.   MRN: [id number]  HPI Patient here for wellness visit. She has history of GERD and reported diverticulosis of colon. She sees a gynecologist and had recent mammogram and Pap smear in May which were normal. DEXA scan about a year and half ago. Previously intolerant of bisphosphonates. No regular use of calcium or vitamin D.  Colonoscopy December 2010. with benign polyps. Tetanus booster 2005. No history of Pneumovax or Zostavax.. No regular calcium or vitamin D use. She walks some for exercise but no consistent exercise  1.  Risk factors based on Past Medical , Social, and Family history  as below and these are reviewed 2.  Limitations in physical activities no limitation in activities. No recent fall 3.  Depression/mood no depression or anxiety issues 4.  Hearing hearing intact 5.  ADLs fully independent in all 6.  Cognitive function (orientation to time and place, language, writing, speech,memory) no problems with short or long-term memory. Judgment intact. Mood normal 7.  Home Safety no issues identified 8.  Height, weight, and visual acuity. Height and weight stable. Vision normal 9.  Counseling calcium and vitamin D consumption and regular weightbearing exercise 10. Recommendation of preventive services. Shingles vaccine and Pneumovax. She will check on coverage for shingles vaccine 11. Labs based on risk factors lipid panel, basic metabolic panel, TSH 12. Care Plan continue yearly flu vaccine. Establish regular weightbearing exercise. Regular supplementation with calcium and vitamin D. Pneumonia vaccine given. Consider shingles vaccine  Past Medical History  Diagnosis Date  . Diverticulitis   . GERD (gastroesophageal reflux disease)   . Hyperlipidemia   . Colon polyps    Past Surgical History  Procedure Date  . Tonsillectomy 1952    reports that she quit smoking about 4 years ago. Her smoking use  included Cigarettes. She has a 2 pack-year smoking history. She does not have any smokeless tobacco history on file. Her alcohol and drug histories not on file. family history includes Cancer in her father and other; Diabetes in her mother; Heart disease in her other; Hyperlipidemia in her father and mother; Hypertension in her mother and other; and Leukemia in her mother. Allergies  Allergen Reactions  . Sulfonamide Derivatives        Review of Systems  Constitutional: Negative for fever, activity change, appetite change and fatigue.  HENT: Negative for hearing loss, ear pain, sore throat and trouble swallowing.   Eyes: Negative for visual disturbance.  Respiratory: Negative for cough and shortness of breath.   Cardiovascular: Negative for chest pain and palpitations.  Gastrointestinal: Negative for abdominal pain, diarrhea, constipation and blood in stool.  Genitourinary: Negative for dysuria and hematuria.  Musculoskeletal: Negative for myalgias, back pain and arthralgias.  Skin: Negative for rash.  Neurological: Negative for dizziness, syncope and headaches.  Hematological: Negative for adenopathy.  Psychiatric/Behavioral: Negative for confusion and dysphoric mood.       Objective:   Physical Exam  Constitutional: She is oriented to person, place, and time. She appears well-developed and well-nourished.  HENT:  Head: Normocephalic and atraumatic.  Eyes: EOM are normal. Pupils are equal, round, and reactive to light.  Neck: Normal range of motion. Neck supple. No thyromegaly present.  Cardiovascular: Normal rate, regular rhythm and normal heart sounds.   No murmur heard. Pulmonary/Chest: Breath sounds normal. No respiratory distress. She has no wheezes. She has no rales.  Abdominal: Soft. Bowel sounds are normal.  She exhibits no distension and no mass. There is no tenderness. There is no rebound and no guarding.  Genitourinary:       Breast and pelvic exam as per GYN    Musculoskeletal: Normal range of motion. She exhibits no edema.  Lymphadenopathy:    She has no cervical adenopathy.  Neurological: She is alert and oriented to person, place, and time. She displays normal reflexes. No cranial nerve deficit.  Skin: No rash noted.  Psychiatric: She has a normal mood and affect. Her behavior is normal. Judgment and thought content normal.          Assessment & Plan:  Health maintenance issues addressed. Needs Pneumovax. Check on coverage for his shingles vaccine. Obtain screening lab work. Increase calcium and vitamin D consumption and weightbearing exercise

## 2011-06-04 NOTE — Patient Instructions (Signed)
Increase weightbearing exercise such as walking Calcium consumption of 1200 mg per day and vitamin D 1000 international units per day Consider shingles vaccine

## 2011-06-05 ENCOUNTER — Telehealth: Payer: Self-pay | Admitting: Family Medicine

## 2011-06-06 NOTE — Telephone Encounter (Signed)
Signed off after completion of immunization.

## 2011-06-09 ENCOUNTER — Telehealth: Payer: Self-pay

## 2011-06-09 MED ORDER — ATORVASTATIN CALCIUM 20 MG PO TABS: 20.0000 mg | ORAL_TABLET | Freq: Every day | ORAL | Status: DC

## 2011-06-09 NOTE — Telephone Encounter (Signed)
Pt notified and verbalized understanding. Rx sent to pharmacy

## 2011-06-09 NOTE — Telephone Encounter (Signed)
Message copied by Beverely Low on Tue Jun 09, 2011  3:04 PM ------      Message from: Kristian Covey      Created: Thu Jun 04, 2011  5:26 PM       Cholesterol is VERY high.  I cannot recall if she has been on statin previously.  Low saturated fat diet and recommend trial of Lipitor 20 mg daily and repeat lipids and hepatic in 2 months.

## 2011-08-04 ENCOUNTER — Telehealth: Payer: Self-pay | Admitting: Family Medicine

## 2011-08-04 NOTE — Telephone Encounter (Signed)
Computer systems down. Pt called 9/18. Possible allergy to Lipitor 20mg . Pt has rash/wlets on neck that keep coming out at night and last 2-3 days.  Per Dr. Caryl Never: hold Lipitor. Schedule f/u to discuss alternatives.

## 2011-08-04 NOTE — Telephone Encounter (Signed)
Appt for f/u on the Lipitor made, per Dr. Lucie Leather recommendation. Appt 9/19. Just FYI.

## 2011-08-05 ENCOUNTER — Ambulatory Visit (INDEPENDENT_AMBULATORY_CARE_PROVIDER_SITE_OTHER): Payer: BC Managed Care – PPO | Admitting: Family Medicine

## 2011-08-05 ENCOUNTER — Encounter: Payer: Self-pay | Admitting: Family Medicine

## 2011-08-05 VITALS — BP 140/78 | Temp 98.6°F | Wt 163.0 lb

## 2011-08-05 DIAGNOSIS — R21 Rash and other nonspecific skin eruption: Secondary | ICD-10-CM

## 2011-08-05 DIAGNOSIS — Z23 Encounter for immunization: Secondary | ICD-10-CM

## 2011-08-05 DIAGNOSIS — E785 Hyperlipidemia, unspecified: Secondary | ICD-10-CM | POA: Insufficient documentation

## 2011-08-05 NOTE — Progress Notes (Signed)
  Subjective:    Patient ID: Shelly [name], female    DOB: 01/26/1946, 65 y.o.   MRN: [id number]  HPI Hyperlipidemia history. Recently started Lipitor for cholesterol 273. No myalgias but has had recurrent hives mostly neck and anterior chest region. This correlates with when she started Lipitor. She has not had any new jewelry or other clear precipitants topically. She has not tried anything for alleviating this. Has had some recent reflux symptoms controlled with omeprazole. No history of CAD or peripheral vascular disease. Patient is concerned rash may be related to Lipitor   Review of Systems  Constitutional: Negative for fever and chills.  Skin: Positive for rash.  Hematological: Negative for adenopathy.       Objective:   Physical Exam  Constitutional: She is oriented to person, place, and time. She appears well-developed and well-nourished. No distress.  HENT:  Mouth/Throat: Oropharynx is clear and moist. No oropharyngeal exudate.  Cardiovascular: Normal rate, regular rhythm and normal heart sounds.   Pulmonary/Chest: Effort normal and breath sounds normal. No respiratory distress. She has no wheezes. She has no rales.  Musculoskeletal: She exhibits no edema.  Neurological: She is alert and oriented to person, place, and time.  Skin:       No rash visible this time          Assessment & Plan:  #1 hyperlipidemia. #2 recurrent pruritic rash neck. Uncertain etiology. Doubt related to Lipitor as this is very localized.  Discontinue and try Crestor 10 mg daily and reassess lipids and hepatic in 6 weeks

## 2011-09-16 ENCOUNTER — Other Ambulatory Visit (INDEPENDENT_AMBULATORY_CARE_PROVIDER_SITE_OTHER): Payer: BC Managed Care – PPO

## 2011-09-16 DIAGNOSIS — E785 Hyperlipidemia, unspecified: Secondary | ICD-10-CM

## 2011-09-16 LAB — LIPID PANEL
Cholesterol: 166 mg/dL (ref 0–200)
HDL: 58.9 mg/dL (ref >39.00)
LDL Cholesterol: 87 mg/dL (ref 0–99)
Total CHOL/HDL Ratio: 3
Triglycerides: 103 mg/dL (ref 0.0–149.0)
VLDL: 20.6 mg/dL (ref 0.0–40.0)

## 2011-09-16 LAB — HEPATIC FUNCTION PANEL
ALT: 19 U/L (ref 0–35)
AST: 20 U/L (ref 0–37)
Albumin: 4 g/dL (ref 3.5–5.2)
Alkaline Phosphatase: 77 U/L (ref 39–117)
Bilirubin, Direct: 0 mg/dL (ref 0.0–0.3)
Total Bilirubin: 0.4 mg/dL (ref 0.3–1.2)
Total Protein: 7 g/dL (ref 6.0–8.3)

## 2011-09-17 NOTE — Progress Notes (Signed)
Quick Note:  Pt informed ______ 

## 2011-09-18 ENCOUNTER — Telehealth: Payer: Self-pay | Admitting: Family Medicine

## 2011-09-18 DIAGNOSIS — E785 Hyperlipidemia, unspecified: Secondary | ICD-10-CM

## 2011-09-18 MED ORDER — PRAVASTATIN SODIUM 40 MG PO TABS: 40.0000 mg | ORAL_TABLET | Freq: Every evening | ORAL | Status: DC

## 2011-09-18 NOTE — Telephone Encounter (Signed)
Please advise 

## 2011-09-18 NOTE — Telephone Encounter (Signed)
Spoke with pt - informed of dr. Caryl Never instructions - [facility] - lab order future done.Pt will call Monday to schedule fasting lab appt. kik

## 2011-09-18 NOTE — Telephone Encounter (Signed)
D/C crestor and start Pravachol 40 mg po qh and repeat lipids and hepatic in 6-8 weeks.

## 2011-09-18 NOTE — Telephone Encounter (Signed)
Pt called and said that she believes she is having side effects with Crestor. Pt is having pain in her sides and low back pain. Pt has discontinued med. Pt is feeling better, after stopping med.  Pt is req a different cholesterol med. Pt uses [facility] in [place].

## 2011-10-15 ENCOUNTER — Ambulatory Visit: Payer: BC Managed Care – PPO | Admitting: Family Medicine

## 2011-10-16 ENCOUNTER — Telehealth: Payer: Self-pay | Admitting: Family Medicine

## 2011-10-16 NOTE — Telephone Encounter (Signed)
patient  States she is feeling a little better this afternoon

## 2011-10-16 NOTE — Telephone Encounter (Signed)
Recommend Saturday clinic.  Monitor fever, drink plenty of fluids, to Urgent care tonight if sx progress.

## 2011-10-16 NOTE — Telephone Encounter (Signed)
Pt is experiencing a sore throat, stuff noise, fever, chills, sinus congestion and headaches. Pt would like to be seen today

## 2012-01-18 ENCOUNTER — Other Ambulatory Visit: Payer: Self-pay | Admitting: Gynecology

## 2012-01-18 DIAGNOSIS — Z1231 Encounter for screening mammogram for malignant neoplasm of breast: Secondary | ICD-10-CM

## 2012-01-25 ENCOUNTER — Encounter: Payer: Self-pay | Admitting: Family Medicine

## 2012-01-25 ENCOUNTER — Ambulatory Visit (INDEPENDENT_AMBULATORY_CARE_PROVIDER_SITE_OTHER): Payer: BC Managed Care – PPO | Admitting: Family Medicine

## 2012-01-25 VITALS — BP 142/80 | Temp 98.0°F | Wt 164.0 lb

## 2012-01-25 DIAGNOSIS — E785 Hyperlipidemia, unspecified: Secondary | ICD-10-CM

## 2012-01-25 MED ORDER — CILIDINIUM-CHLORDIAZEPOXIDE 2.5-5 MG PO CAPS: 1.0000 | ORAL_CAPSULE | Freq: Three times a day (TID) | ORAL | Status: DC | PRN

## 2012-01-25 NOTE — Patient Instructions (Signed)
Livalo 2mg  one daily

## 2012-01-25 NOTE — Progress Notes (Signed)
  Subjective:    Patient ID: Shelly [name], female    DOB: 03-11-1946, 66 y.o.   MRN: [id number]  HPI  Here to discuss hyperlipidemia. She's had cholesterol near 300. Had good response to Crestor but developed side effects. She had hives with Lipitor and had GI side effects with both Crestor and Pravachol. She had diarrhea, cramping and bloated feeling which went away after she stopped both medications. She denies any myalgias. Mother had CAD in her 9s. Patient is nonsmoker.   Review of Systems  Constitutional: Negative for appetite change and unexpected weight change.  Respiratory: Negative for cough and shortness of breath.   Cardiovascular: Negative for chest pain, palpitations and leg swelling.       Objective:   Physical Exam  Constitutional: She appears well-developed and well-nourished.  Cardiovascular: Normal rate, regular rhythm and normal heart sounds.   Pulmonary/Chest: Effort normal and breath sounds normal. No respiratory distress. She has no wheezes. She has no rales.  Musculoskeletal: She exhibits no edema.          Assessment & Plan:  Hyperlipidemia. Previous intolerance to multiple medications as above. Trial of Livalo 2 mg daily with samples provided. Reassess lipid and hepatic in 6 weeks if tolerating

## 2012-02-21 DIAGNOSIS — B029 Zoster without complications: Secondary | ICD-10-CM | POA: Diagnosis not present

## 2012-02-26 ENCOUNTER — Encounter: Payer: Self-pay | Admitting: Family Medicine

## 2012-02-26 ENCOUNTER — Ambulatory Visit (INDEPENDENT_AMBULATORY_CARE_PROVIDER_SITE_OTHER): Payer: Medicare Other | Admitting: Family Medicine

## 2012-02-26 VITALS — BP 140/70 | Temp 99.1°F | Wt 166.0 lb

## 2012-02-26 DIAGNOSIS — B029 Zoster without complications: Secondary | ICD-10-CM

## 2012-02-26 NOTE — Patient Instructions (Signed)

## 2012-02-26 NOTE — Progress Notes (Signed)
  Subjective:    Patient ID: Shelly [name], female    DOB: [birth year], 66 y.o.   MRN: [id number]  HPI  Patient seen for shingles.  She developed some left lower abdominal pains in the last week and by Sunday noticed some blistery rash. She went to urgent care and was diagnosed with shingles and placed on Valtrex 1 g 3 times a day and given hydrocodone for pain. She is mostly taking over-the-counter Tylenol which is controlling her pain. 6/10 in severity at worst. The pain fluctuates. Burning and stinging quality. Rash is already starting to dry up somewhat.  Review of Systems  Constitutional: Negative for fever and chills.  Genitourinary: Negative for dysuria.  Skin: Positive for rash.       Objective:   Physical Exam  Constitutional: She appears well-developed and well-nourished.  Cardiovascular: Normal rate and regular rhythm.   Pulmonary/Chest: Effort normal and breath sounds normal. No respiratory distress. She has no wheezes. She has no rales.  Skin:       Patient has vesicular rash dermatomal distribution spreading from mid back lower lumbar area toward abdomen          Assessment & Plan:  Shingles. Coping well with pain. Finish out Valtrex. She has hydrocodone for as needed use for pain. Followup as needed

## 2012-03-01 ENCOUNTER — Telehealth: Payer: Self-pay | Admitting: *Deleted

## 2012-03-01 MED ORDER — GABAPENTIN 300 MG PO CAPS: 300.0000 mg | ORAL_CAPSULE | Freq: Three times a day (TID) | ORAL | Status: DC

## 2012-03-01 NOTE — Telephone Encounter (Signed)
Start gabapentin 300 mg po qhs for 3 days, then titrate to one bid and make sure she has follow up within 2 weeks to reassess.

## 2012-03-01 NOTE — Telephone Encounter (Signed)
Pt does not like the Hydrocodone that she has for pain for the shingles.  Says Dr Caryl Never says he could call a different RX in for her if she needed it.

## 2012-03-01 NOTE — Telephone Encounter (Signed)
Meds called to pharmacy. Pt notified.

## 2012-03-07 ENCOUNTER — Other Ambulatory Visit: Payer: BC Managed Care – PPO

## 2012-03-13 LAB — HM PAP SMEAR: HM Pap smear: NEGATIVE

## 2012-03-13 LAB — HM MAMMOGRAPHY: HM Mammogram: NEGATIVE

## 2012-03-28 ENCOUNTER — Ambulatory Visit
Admission: RE | Admit: 2012-03-28 | Discharge: 2012-03-28 | Disposition: A | Discharge status: Home / Self Care | Payer: Medicare Other | Source: Ambulatory Visit | Attending: Gynecology | Admitting: Gynecology

## 2012-03-28 DIAGNOSIS — Z1231 Encounter for screening mammogram for malignant neoplasm of breast: Secondary | ICD-10-CM | POA: Diagnosis not present

## 2012-04-14 DIAGNOSIS — Z124 Encounter for screening for malignant neoplasm of cervix: Secondary | ICD-10-CM | POA: Diagnosis not present

## 2012-04-14 DIAGNOSIS — Z01419 Encounter for gynecological examination (general) (routine) without abnormal findings: Secondary | ICD-10-CM | POA: Diagnosis not present

## 2012-04-14 DIAGNOSIS — Z1212 Encounter for screening for malignant neoplasm of rectum: Secondary | ICD-10-CM | POA: Diagnosis not present

## 2012-04-14 DIAGNOSIS — Z130 Encounter for screening for diseases of the blood and blood-forming organs and certain disorders involving the immune mechanism: Secondary | ICD-10-CM | POA: Diagnosis not present

## 2012-04-18 DIAGNOSIS — R1032 Left lower quadrant pain: Secondary | ICD-10-CM | POA: Diagnosis not present

## 2012-04-19 ENCOUNTER — Other Ambulatory Visit: Payer: Self-pay | Admitting: Gynecology

## 2012-04-19 DIAGNOSIS — N950 Postmenopausal bleeding: Secondary | ICD-10-CM | POA: Diagnosis not present

## 2012-04-19 DIAGNOSIS — N859 Noninflammatory disorder of uterus, unspecified: Secondary | ICD-10-CM | POA: Diagnosis not present

## 2012-04-19 DIAGNOSIS — F411 Generalized anxiety disorder: Secondary | ICD-10-CM | POA: Diagnosis not present

## 2012-04-19 DIAGNOSIS — N882 Stricture and stenosis of cervix uteri: Secondary | ICD-10-CM | POA: Diagnosis not present

## 2012-04-25 DIAGNOSIS — N950 Postmenopausal bleeding: Secondary | ICD-10-CM | POA: Diagnosis not present

## 2012-07-28 ENCOUNTER — Ambulatory Visit (INDEPENDENT_AMBULATORY_CARE_PROVIDER_SITE_OTHER): Payer: Medicare Other

## 2012-07-28 DIAGNOSIS — Z23 Encounter for immunization: Secondary | ICD-10-CM | POA: Diagnosis not present

## 2012-08-01 ENCOUNTER — Ambulatory Visit: Payer: Medicare Other | Admitting: Family Medicine

## 2012-09-20 DIAGNOSIS — H5200 Hypermetropia, unspecified eye: Secondary | ICD-10-CM | POA: Diagnosis not present

## 2012-09-20 DIAGNOSIS — H524 Presbyopia: Secondary | ICD-10-CM | POA: Diagnosis not present

## 2012-09-20 DIAGNOSIS — H52229 Regular astigmatism, unspecified eye: Secondary | ICD-10-CM | POA: Diagnosis not present

## 2012-09-20 DIAGNOSIS — R51 Headache: Secondary | ICD-10-CM | POA: Diagnosis not present

## 2012-11-11 ENCOUNTER — Telehealth: Payer: Self-pay | Admitting: Family Medicine

## 2012-11-11 NOTE — Telephone Encounter (Signed)
Caller: Kelee/Patient; Phone: [phone]; Reason for Call: Patient states she would like to change her cholesterol medication to Simvastatin from Pravastatin.  Patient states she has been taking her husbands Simvastatin 20mg  tab and cutting it in half, so 10mg  nightly.  PLEASE F/U WITH PATIENT.  THANK YOU.

## 2012-11-14 NOTE — Telephone Encounter (Signed)
We do not currently have her down on Pravachol-chart say Livalo. Is OK to D/c pravachol (if she is still taking) and start Simvastatin 20 mg daily but pt will need office f/u 6-8 weeks to reassess and check lipid and hepatic then.

## 2012-11-15 ENCOUNTER — Encounter: Payer: Self-pay | Admitting: Family Medicine

## 2012-11-15 ENCOUNTER — Ambulatory Visit (INDEPENDENT_AMBULATORY_CARE_PROVIDER_SITE_OTHER): Payer: Medicare Other | Admitting: Family Medicine

## 2012-11-15 VITALS — BP 140/74 | HR 81 | Temp 98.4°F | Wt 167.0 lb

## 2012-11-15 DIAGNOSIS — R21 Rash and other nonspecific skin eruption: Secondary | ICD-10-CM | POA: Diagnosis not present

## 2012-11-15 DIAGNOSIS — E785 Hyperlipidemia, unspecified: Secondary | ICD-10-CM

## 2012-11-15 MED ORDER — CLOTRIMAZOLE-BETAMETHASONE 1-0.05 % EX CREA: TOPICAL_CREAM | Freq: Two times a day (BID) | CUTANEOUS | Status: DC

## 2012-11-15 MED ORDER — SIMVASTATIN 20 MG PO TABS: 20.0000 mg | ORAL_TABLET | Freq: Every evening | ORAL | Status: DC

## 2012-11-15 NOTE — Progress Notes (Signed)
  Subjective:    Patient ID: Shelly [name], female    DOB: 12/01/1945, 66 y.o.   MRN: [id number]  HPI  Patient here to discuss hyperlipidemia issues. She has history of severe hyperlipidemia. She's had previous intolerance to Crestor, Lipitor, and pravastatin. Some of her intolerances were somewhat questionable in terms of whether these were true reactions. She started her husband's simvastatin about a month ago. She's currently taking 10 mg daily and has no myalgias. She developed rash under both breasts within past couple days and thought this was related to reaction to simvastatin. Rash was slightly burning quality and slightly pruritic.  Review of Systems  Constitutional: Negative for fever and chills.  Respiratory: Negative for shortness of breath.   Cardiovascular: Negative for chest pain.  Skin: Positive for rash.       Objective:   Physical Exam  Constitutional: She appears well-developed and well-nourished.  Cardiovascular: Normal rate and regular rhythm.   Pulmonary/Chest: Effort normal and breath sounds normal. No respiratory distress. She has no wheezes. She has no rales.  Skin: Rash noted.       Is macular slightly blanching rash -non scaly, nonvesicular under both breast and extending into the upper abdominal region. Fairly well demarcated borders.          Assessment & Plan:  #1 hyperlipidemia. Questionable intolerance to statins as above. She has tolerated simvastatin over the past month. She has current rash which we do not think is related -not generalized. Prescription for simvastatin 20 mg daily and recheck lipids in 6-8 weeks #2 upper abdomen rash under breasts. Question fungal. Lotrisone cream twice daily.  Keep area as dry as possible.

## 2012-11-15 NOTE — Telephone Encounter (Signed)
Pt was seen here in the office on 11/15/12 and Dr. Caryl Never went over the below information with pt.

## 2013-01-25 ENCOUNTER — Other Ambulatory Visit: Payer: Self-pay

## 2013-01-25 DIAGNOSIS — Z1231 Encounter for screening mammogram for malignant neoplasm of breast: Secondary | ICD-10-CM

## 2013-02-22 DIAGNOSIS — M6740 Ganglion, unspecified site: Secondary | ICD-10-CM | POA: Diagnosis not present

## 2013-02-22 DIAGNOSIS — M19049 Primary osteoarthritis, unspecified hand: Secondary | ICD-10-CM | POA: Diagnosis not present

## 2013-03-13 ENCOUNTER — Encounter: Payer: Self-pay | Admitting: Family Medicine

## 2013-03-13 ENCOUNTER — Telehealth: Payer: Self-pay | Admitting: Family Medicine

## 2013-03-13 ENCOUNTER — Ambulatory Visit (INDEPENDENT_AMBULATORY_CARE_PROVIDER_SITE_OTHER): Payer: Medicare Other | Admitting: Family Medicine

## 2013-03-13 VITALS — BP 150/80 | HR 72 | Temp 98.5°F | Resp 12 | Ht 65.0 in | Wt 166.0 lb

## 2013-03-13 DIAGNOSIS — K219 Gastro-esophageal reflux disease without esophagitis: Secondary | ICD-10-CM

## 2013-03-13 DIAGNOSIS — Z23 Encounter for immunization: Secondary | ICD-10-CM

## 2013-03-13 DIAGNOSIS — Z0000 Encounter for general adult medical examination without abnormal findings: Secondary | ICD-10-CM

## 2013-03-13 DIAGNOSIS — E785 Hyperlipidemia, unspecified: Secondary | ICD-10-CM

## 2013-03-13 MED ORDER — CILIDINIUM-CHLORDIAZEPOXIDE 2.5-5 MG PO CAPS: ORAL_CAPSULE | ORAL | Status: DC

## 2013-03-13 NOTE — Telephone Encounter (Signed)
Please order labs for next visit.

## 2013-03-13 NOTE — Progress Notes (Signed)
Subjective:    Patient ID: Shelly [name], female    DOB: 1946/10/12, 67 y.o.   MRN: [id number]  HPI Patient seen for Medicare wellness exam and medical followup  She has history of hyperlipidemia but has been intolerant of multiple statins. History of GERD currently stable off medications. She's had frequent gastrointestinal symptoms and has been treated with Librax per gastroenterology in the past. Requesting refills.  She had shingles last year. No history of shingles vaccine.  Other immunizations up-to-date. Continue see gynecologist. Colonoscopy up to date. Previously intolerant of bisphosphonates.  Past Medical History  Diagnosis Date  . Diverticulitis   . GERD (gastroesophageal reflux disease)   . Hyperlipidemia   . Colon polyps    Past Surgical History  Procedure Laterality Date  . Tonsillectomy  1952    reports that she quit smoking about 5 years ago. Her smoking use included Cigarettes. She has a 2 pack-year smoking history. She has never used smokeless tobacco. She reports that  drinks alcohol. She reports that she does not use illicit drugs. family history includes Cancer in her father and other; Depression in her brother; Diabetes in her mother; Heart disease in her other; Hyperlipidemia in her father and mother; Hypertension in her mother and other; and Leukemia in her mother. Allergies  Allergen Reactions  . Crestor (Rosuvastatin Calcium)     GI upset  . Lipitor (Atorvastatin Calcium) Itching  . Pravastatin     GI upset  . Sulfonamide Derivatives     Hives, itiching   1.  Risk factors based on Past Medical , Social, and Family history reviewed and as above 2.  Limitations in physical activities low risk for fall. No consistent exercise 3.  Depression/mood occasionally feels anxious but no depression 4.  Hearing no major deficits 5.  ADLs independent in all 6.  Cognitive function (orientation to time and place, language, writing, speech,memory) no memory  deficits. Language and judgment intact 7.  Home Safety no issues 8.  Height, weight, and visual acuity. All stable 9.  Counseling discussed the importance of regular exercise especially weightbearing exercise and calcium and vitamin D 10. Recommendation of preventive services. Check on coverage for shingles vaccine 11. Labs based on risk factors none indicated 12. Care Plan as above    Review of Systems  Constitutional: Negative for fever, activity change, appetite change, fatigue and unexpected weight change.  HENT: Negative for hearing loss, ear pain, sore throat, trouble swallowing and voice change.   Eyes: Negative for visual disturbance.  Respiratory: Negative for cough and shortness of breath.   Cardiovascular: Negative for chest pain and palpitations.  Gastrointestinal: Negative for vomiting, abdominal pain, diarrhea, constipation and blood in stool.  Genitourinary: Negative for dysuria and hematuria.  Musculoskeletal: Positive for arthralgias. Negative for myalgias and back pain.  Skin: Negative for rash.  Neurological: Negative for dizziness, syncope and headaches.  Hematological: Negative for adenopathy.  Psychiatric/Behavioral: Negative for confusion and dysphoric mood.       Objective:   Physical Exam  Constitutional: She is oriented to person, place, and time. She appears well-developed and well-nourished.  HENT:  Head: Normocephalic and atraumatic.  Eyes: EOM are normal. Pupils are equal, round, and reactive to light.  Neck: Normal range of motion. Neck supple. No thyromegaly present.  Cardiovascular: Normal rate, regular rhythm and normal heart sounds.   No murmur heard. Pulmonary/Chest: Breath sounds normal. No respiratory distress. She has no wheezes. She has no rales.  Abdominal: Soft. Bowel sounds are  normal. She exhibits no distension and no mass. There is no tenderness. There is no rebound and no guarding.  Genitourinary:  Per GYN  Musculoskeletal: Normal  range of motion. She exhibits no edema.  Lymphadenopathy:    She has no cervical adenopathy.  Neurological: She is alert and oriented to person, place, and time. She displays normal reflexes. No cranial nerve deficit.  Skin: No rash noted.  Multiple scattered seborrheic keratoses. No concerning lesions  Psychiatric: She has a normal mood and affect. Her behavior is normal. Judgment and thought content normal.          Assessment & Plan:  #1 health maintenance. Check on coverage for shingles vaccine. Discussed weightbearing exercise and calcium and vitamin D. Monitor blood pressures closely #2 elevated blood pressure. Not treated for hypertension. Check blood pressures at home and be in touch if consistently greater than 150 systolic #3 hyperlipidemia. Previously intolerant of statin medications

## 2013-03-13 NOTE — Patient Instructions (Addendum)
Monitor blood pressure and be in touch if consistently greater than 150/90 Weight bearing exercise as tolerated Livalo 2mg  take one every Monday, Wednesday, and Friday

## 2013-03-13 NOTE — Telephone Encounter (Signed)
Pt informed labs will be done at 6 week follow-up visit.  I do not week a 6 week appt made.

## 2013-03-31 ENCOUNTER — Ambulatory Visit: Payer: Medicare Other

## 2013-04-07 ENCOUNTER — Ambulatory Visit
Admission: RE | Admit: 2013-04-07 | Discharge: 2013-04-07 | Disposition: A | Discharge status: Home / Self Care | Payer: Medicare Other | Source: Ambulatory Visit

## 2013-04-07 DIAGNOSIS — Z1231 Encounter for screening mammogram for malignant neoplasm of breast: Secondary | ICD-10-CM | POA: Diagnosis not present

## 2013-04-17 DIAGNOSIS — Z124 Encounter for screening for malignant neoplasm of cervix: Secondary | ICD-10-CM | POA: Diagnosis not present

## 2013-04-17 DIAGNOSIS — Z1212 Encounter for screening for malignant neoplasm of rectum: Secondary | ICD-10-CM | POA: Diagnosis not present

## 2013-04-18 ENCOUNTER — Other Ambulatory Visit: Payer: Medicare Other

## 2013-04-25 ENCOUNTER — Ambulatory Visit: Payer: Medicare Other | Admitting: Family Medicine

## 2013-05-08 ENCOUNTER — Encounter: Payer: Self-pay | Admitting: Family Medicine

## 2013-05-08 ENCOUNTER — Ambulatory Visit (INDEPENDENT_AMBULATORY_CARE_PROVIDER_SITE_OTHER): Payer: Medicare Other | Admitting: Family Medicine

## 2013-05-08 ENCOUNTER — Other Ambulatory Visit: Payer: Medicare Other

## 2013-05-08 VITALS — BP 128/78 | HR 69 | Temp 97.9°F | Resp 14 | Wt 168.5 lb

## 2013-05-08 DIAGNOSIS — E785 Hyperlipidemia, unspecified: Secondary | ICD-10-CM

## 2013-05-08 NOTE — Progress Notes (Signed)
  Subjective:    Patient ID: Shelly [name], female    DOB: 1946/05/11, 67 y.o.   MRN: [id number]  HPI Followup hyperlipidemia and elevated blood pressure from her physical back in April Never treated for hypertension. Blood pressures monitored well over the past couple months and consistently normal range. No consistent exercise.  History of hyperlipidemia. Previous intolerance to many statins. We provided samples of Livalo 2 mg but she has not yet started taking. We have suggested even taking this every other day at first.  She has no history of cardiac disease or peripheral vascular disease.  Past Medical History  Diagnosis Date  . Diverticulitis   . GERD (gastroesophageal reflux disease)   . Hyperlipidemia   . Colon polyps    Past Surgical History  Procedure Laterality Date  . Tonsillectomy  1952    reports that she quit smoking about 5 years ago. Her smoking use included Cigarettes. She has a 2 pack-year smoking history. She has never used smokeless tobacco. She reports that  drinks alcohol. She reports that she does not use illicit drugs. family history includes Cancer in her father and other; Depression in her brother; Diabetes in her mother; Heart disease in her other; Hyperlipidemia in her father and mother; Hypertension in her mother and other; and Leukemia in her mother. Allergies  Allergen Reactions  . Crestor (Rosuvastatin Calcium)     GI upset  . Lipitor (Atorvastatin Calcium) Itching  . Pravastatin     GI upset  . Sulfonamide Derivatives     Hives, itiching      Review of Systems  Constitutional: Negative for fatigue.  Eyes: Negative for visual disturbance.  Respiratory: Negative for cough, chest tightness, shortness of breath and wheezing.   Cardiovascular: Negative for chest pain, palpitations and leg swelling.  Neurological: Negative for dizziness, seizures, syncope, weakness, light-headedness and headaches.       Objective:   Physical Exam   Constitutional: She appears well-developed and well-nourished.  Neck: Neck supple. No thyromegaly present.  Cardiovascular: Normal rate and regular rhythm.   Pulmonary/Chest: Effort normal and breath sounds normal. No respiratory distress. She has no wheezes. She has no rales.  Musculoskeletal: She exhibits no edema.          Assessment & Plan:  #1 elevated blood pressure. Normal reading today and several normal home readings. Continue monitoring. We discussed the importance of establishing more consistent exercise and weight loss #2 hyperlipidemia. Start Livalo 2 mg daily and future labs ordered with lipid and hepatic in 6 weeks

## 2013-05-11 ENCOUNTER — Ambulatory Visit: Payer: Medicare Other | Admitting: Family Medicine

## 2013-06-22 ENCOUNTER — Other Ambulatory Visit: Payer: Medicare Other

## 2013-08-01 ENCOUNTER — Ambulatory Visit (INDEPENDENT_AMBULATORY_CARE_PROVIDER_SITE_OTHER): Payer: Medicare Other

## 2013-08-01 DIAGNOSIS — Z23 Encounter for immunization: Secondary | ICD-10-CM

## 2013-08-04 ENCOUNTER — Other Ambulatory Visit: Payer: Medicare Other

## 2013-10-09 DIAGNOSIS — N950 Postmenopausal bleeding: Secondary | ICD-10-CM | POA: Diagnosis not present

## 2013-10-17 ENCOUNTER — Ambulatory Visit: Payer: Medicare Other | Admitting: Family Medicine

## 2013-10-17 DIAGNOSIS — N840 Polyp of corpus uteri: Secondary | ICD-10-CM | POA: Diagnosis not present

## 2013-10-17 DIAGNOSIS — N950 Postmenopausal bleeding: Secondary | ICD-10-CM | POA: Diagnosis not present

## 2013-10-23 DIAGNOSIS — N950 Postmenopausal bleeding: Secondary | ICD-10-CM | POA: Diagnosis not present

## 2013-10-24 ENCOUNTER — Encounter (HOSPITAL_COMMUNITY): Payer: Self-pay | Admitting: Pharmacy Technician

## 2013-10-24 DIAGNOSIS — H1045 Other chronic allergic conjunctivitis: Secondary | ICD-10-CM | POA: Diagnosis not present

## 2013-10-26 NOTE — Patient Instructions (Addendum)
   Your procedure is scheduled on: Tuesday, Dec 16  Enter through the [employer] of [facility] at: 1130 AM Pick up the phone at the desk and dial [phone] and inform us of your arrival.  Please call this number if you have any problems the morning of surgery: [phone]  Remember: Do not eat food after midnight: Monday Do not drink clear liquids after: 9 Am Tuesday, day of surgery Take these medicines the morning of surgery with a SIP OF WATER: None  Do not wear jewelry, make-up, or FINGER nail polish No metal in your hair or on your body. Do not wear lotions, powders, perfumes. You may wear deodorant.  Please use your CHG wash as directed prior to surgery.  Do not shave anywhere for at least 12 hours prior to first CHG shower.  Do not bring valuables to the hospital. Contacts, dentures or bridgework may not be worn into surgery.  Patients discharged on the day of surgery will not be allowed to drive home.  Home with husband Jillyn [place] [phone].

## 2013-10-27 ENCOUNTER — Encounter (HOSPITAL_COMMUNITY): Payer: Self-pay

## 2013-10-27 ENCOUNTER — Encounter (HOSPITAL_COMMUNITY)
Admission: RE | Admit: 2013-10-27 | Discharge: 2013-10-27 | Disposition: A | Discharge status: Home / Self Care | Payer: Medicare Other | Source: Ambulatory Visit | Attending: Obstetrics and Gynecology | Admitting: Obstetrics and Gynecology

## 2013-10-27 ENCOUNTER — Other Ambulatory Visit: Payer: Self-pay

## 2013-10-27 DIAGNOSIS — Z01818 Encounter for other preprocedural examination: Secondary | ICD-10-CM | POA: Insufficient documentation

## 2013-10-27 DIAGNOSIS — Z01812 Encounter for preprocedural laboratory examination: Secondary | ICD-10-CM | POA: Diagnosis not present

## 2013-10-27 DIAGNOSIS — Z01810 Encounter for preprocedural cardiovascular examination: Secondary | ICD-10-CM | POA: Insufficient documentation

## 2013-10-27 HISTORY — DX: Irritable bowel syndrome, unspecified: K58.9

## 2013-10-27 HISTORY — DX: Missed abortion: O02.1

## 2013-10-27 HISTORY — DX: Unspecified osteoarthritis, unspecified site: M19.90

## 2013-10-27 LAB — BASIC METABOLIC PANEL
BUN: 16 mg/dL (ref 6–23)
CO2: 28 mEq/L (ref 19–32)
Calcium: 9.4 mg/dL (ref 8.4–10.5)
Chloride: 100 mEq/L (ref 96–112)
Creatinine, Ser: 0.82 mg/dL (ref 0.50–1.10)
GFR calc Af Amer: 83 mL/min — ABNORMAL LOW (ref >90)
GFR calc non Af Amer: 72 mL/min — ABNORMAL LOW (ref >90)
Glucose, Bld: 100 mg/dL — ABNORMAL HIGH (ref 70–99)
Potassium: 4 mEq/L (ref 3.5–5.1)
Sodium: 137 mEq/L (ref 135–145)

## 2013-10-27 LAB — CBC
HCT: 40.2 % (ref 36.0–46.0)
Hemoglobin: 13.6 g/dL (ref 12.0–15.0)
MCH: 29.4 pg (ref 26.0–34.0)
MCHC: 33.8 g/dL (ref 30.0–36.0)
MCV: 86.8 fL (ref 78.0–100.0)
Platelets: 320 10*3/uL (ref 150–400)
RBC: 4.63 MIL/uL (ref 3.87–5.11)
RDW: 13.7 % (ref 11.5–15.5)
WBC: 6.6 10*3/uL (ref 4.0–10.5)

## 2013-10-31 ENCOUNTER — Encounter (HOSPITAL_COMMUNITY): Payer: Medicare Other | Admitting: Anesthesiology

## 2013-10-31 ENCOUNTER — Ambulatory Visit (HOSPITAL_COMMUNITY): Payer: Medicare Other | Admitting: Anesthesiology

## 2013-10-31 ENCOUNTER — Encounter (HOSPITAL_COMMUNITY)
Admission: RE | Disposition: A | Discharge status: Home / Self Care | Payer: Self-pay | Source: Ambulatory Visit | Attending: Obstetrics and Gynecology

## 2013-10-31 ENCOUNTER — Ambulatory Visit (HOSPITAL_COMMUNITY)
Admission: RE | Admit: 2013-10-31 | Discharge: 2013-10-31 | Disposition: A | Discharge status: Home / Self Care | Payer: Medicare Other | Source: Ambulatory Visit | Attending: Obstetrics and Gynecology | Admitting: Obstetrics and Gynecology

## 2013-10-31 DIAGNOSIS — N950 Postmenopausal bleeding: Secondary | ICD-10-CM | POA: Diagnosis not present

## 2013-10-31 DIAGNOSIS — N840 Polyp of corpus uteri: Secondary | ICD-10-CM | POA: Diagnosis not present

## 2013-10-31 DIAGNOSIS — N8500 Endometrial hyperplasia, unspecified: Secondary | ICD-10-CM | POA: Diagnosis not present

## 2013-10-31 HISTORY — PX: DILATATION & CURETTAGE/HYSTEROSCOPY WITH TRUECLEAR: SHX6353

## 2013-10-31 SURGERY — DILATATION & CURETTAGE/HYSTEROSCOPY WITH TRUCLEAR
Anesthesia: General

## 2013-10-31 MED ORDER — FENTANYL CITRATE 0.05 MG/ML IJ SOLN
INTRAMUSCULAR | Status: DC | PRN
  Administered 2013-10-31: 50 ug via INTRAVENOUS
  Administered 2013-10-31: 100 ug via INTRAVENOUS

## 2013-10-31 MED ORDER — LIDOCAINE HCL 1 % IJ SOLN
INTRAMUSCULAR | Status: DC | PRN
  Administered 2013-10-31: 10 mL

## 2013-10-31 MED ORDER — FENTANYL CITRATE 0.05 MG/ML IJ SOLN
INTRAMUSCULAR | Status: AC
  Filled 2013-10-31: qty 5

## 2013-10-31 MED ORDER — SODIUM CHLORIDE 0.9 % IR SOLN
Status: DC | PRN
  Administered 2013-10-31 (×2): 3000 mL

## 2013-10-31 MED ORDER — LIDOCAINE HCL (CARDIAC) 20 MG/ML IV SOLN
INTRAVENOUS | Status: AC
  Filled 2013-10-31: qty 5

## 2013-10-31 MED ORDER — LACTATED RINGERS IV SOLN
INTRAVENOUS | Status: DC
  Administered 2013-10-31: 12:00:00 via INTRAVENOUS

## 2013-10-31 MED ORDER — ONDANSETRON HCL 4 MG/2ML IJ SOLN
INTRAMUSCULAR | Status: AC
  Filled 2013-10-31: qty 2

## 2013-10-31 MED ORDER — ONDANSETRON HCL 4 MG/2ML IJ SOLN
INTRAMUSCULAR | Status: DC | PRN
  Administered 2013-10-31: 4 mg via INTRAVENOUS

## 2013-10-31 MED ORDER — FENTANYL CITRATE 0.05 MG/ML IJ SOLN: 25.0000 ug | INTRAMUSCULAR | Status: DC | PRN

## 2013-10-31 MED ORDER — LIDOCAINE HCL 1 % IJ SOLN
INTRAMUSCULAR | Status: AC
  Filled 2013-10-31: qty 20

## 2013-10-31 MED ORDER — PROPOFOL 10 MG/ML IV BOLUS
INTRAVENOUS | Status: DC | PRN
  Administered 2013-10-31: 150 mg via INTRAVENOUS

## 2013-10-31 MED ORDER — KETOROLAC TROMETHAMINE 30 MG/ML IJ SOLN
INTRAMUSCULAR | Status: AC
  Filled 2013-10-31: qty 1

## 2013-10-31 MED ORDER — PROPOFOL 10 MG/ML IV EMUL
INTRAVENOUS | Status: AC
  Filled 2013-10-31: qty 20

## 2013-10-31 MED ORDER — LIDOCAINE HCL (CARDIAC) 20 MG/ML IV SOLN
INTRAVENOUS | Status: DC | PRN
  Administered 2013-10-31: 60 mg via INTRAVENOUS

## 2013-10-31 MED ORDER — DEXTROSE 5 % IV SOLN
2.0000 g | INTRAVENOUS | Status: AC
  Administered 2013-10-31: 2 g via INTRAVENOUS
  Filled 2013-10-31: qty 2

## 2013-10-31 MED ORDER — MIDAZOLAM HCL 2 MG/2ML IJ SOLN
INTRAMUSCULAR | Status: AC
  Filled 2013-10-31: qty 2

## 2013-10-31 MED ORDER — HYDROCODONE-IBUPROFEN 7.5-200 MG PO TABS: 1.0000 | ORAL_TABLET | Freq: Four times a day (QID) | ORAL | Status: DC | PRN

## 2013-10-31 MED ORDER — MIDAZOLAM HCL 2 MG/2ML IJ SOLN
INTRAMUSCULAR | Status: DC | PRN
  Administered 2013-10-31: .5 mg via INTRAVENOUS
  Administered 2013-10-31: 1 mg via INTRAVENOUS

## 2013-10-31 SURGICAL SUPPLY — 22 items
BLADE INCISOR TRUC PLUS 2.9 (ABLATOR) IMPLANT
CANISTERS HI-FLOW 3000CC (CANNISTER) IMPLANT
CATH ROBINSON RED A/P 16FR (CATHETERS) ×2 IMPLANT
CLOTH BEACON ORANGE TIMEOUT ST (SAFETY) ×2 IMPLANT
CONTAINER PREFILL 10% NBF 60ML (FORM) ×4 IMPLANT
DRAPE HYSTEROSCOPY (DRAPE) ×2 IMPLANT
DRSG TELFA 3X8 NADH (GAUZE/BANDAGES/DRESSINGS) ×2 IMPLANT
GLOVE BIO SURGEON STRL SZ 6.5 (GLOVE) ×2 IMPLANT
GLOVE BIOGEL PI IND STRL 7.0 (GLOVE) ×1 IMPLANT
GLOVE BIOGEL PI INDICATOR 7.0 (GLOVE) ×1
GOWN STRL REIN XL XLG (GOWN DISPOSABLE) ×4 IMPLANT
INCISOR TRUC PLUS BLADE 2.9 (ABLATOR) ×2
KIT HYSTEROSCOPY TRUCLEAR (ABLATOR) IMPLANT
MORCELLATOR RECIP TRUCLEAR 4.0 (ABLATOR) IMPLANT
NDL SPNL 22GX3.5 QUINCKE BK (NEEDLE) ×1 IMPLANT
NEEDLE SPNL 22GX3.5 QUINCKE BK (NEEDLE) ×2 IMPLANT
PACK VAGINAL MINOR WOMEN LF (CUSTOM PROCEDURE TRAY) ×2 IMPLANT
PAD DRESSING TELFA 3X8 NADH (GAUZE/BANDAGES/DRESSINGS) ×1 IMPLANT
PAD OB MATERNITY 4.3X12.25 (PERSONAL CARE ITEMS) ×2 IMPLANT
SYR CONTROL 10ML LL (SYRINGE) ×2 IMPLANT
TOWEL OR 17X24 6PK STRL BLUE (TOWEL DISPOSABLE) ×4 IMPLANT
WATER STERILE IRR 1000ML POUR (IV SOLUTION) ×2 IMPLANT

## 2013-10-31 NOTE — Transfer of Care (Signed)
Immediate Anesthesia Transfer of Care Note  Patient: Shelly [name]  Procedure(s) Performed: Procedure(s): DILATATION & CURETTAGE/HYSTEROSCOPY WITH TRUCLEAR (N/A)  Patient Location: PACU  Anesthesia Type:General  Level of Consciousness: awake, alert  and oriented  Airway & Oxygen Therapy: Patient Spontanous Breathing and Patient connected to nasal cannula oxygen  Post-op Assessment: Report given to PACU RN and Post -op Vital signs reviewed and stable  Post vital signs: stable  Complications: No apparent anesthesia complications

## 2013-10-31 NOTE — Anesthesia Postprocedure Evaluation (Signed)
  Anesthesia Post-op Note  Patient: Shelly [name]  Procedure(s) Performed: Procedure(s): DILATATION & CURETTAGE/HYSTEROSCOPY WITH TRUCLEAR (N/A) Patient is awake and responsive. Pain and nausea are reasonably well controlled. Vital signs are stable and clinically acceptable. Oxygen saturation is clinically acceptable. There are no apparent anesthetic complications at this time. Patient is ready for discharge.

## 2013-10-31 NOTE — H&P (Addendum)
67 yo G2P1 presents for surgical mngt of endometrial polyps and irregular VB  PMHx:  IBS PSHx: laparoscopy, T&A, SVD All:  Donnatol, sulfa Meds: SHx:  Negative tobacco  Af, VSS Gen - NAD Abd - soft, NT CV - RRR Lungs - clear Ext - NT PV - uterus mobile, NT  [place]:  2.5cm polypoid mass noted after saline infusion.  No adnexal mass  A/P:  PMB, polypoid endometrial mass Hysteroscopy, d&C, resection of endometrial mass R/b/a discussed, informed consent.

## 2013-10-31 NOTE — Anesthesia Preprocedure Evaluation (Signed)
Anesthesia Evaluation  Patient identified by MRN, date of birth, ID band Patient awake    Reviewed: Allergy & Precautions, H&P , Patient's Chart, lab work & pertinent test results, reviewed documented beta blocker date and time   Airway Mallampati: II TM Distance: >3 FB Neck ROM: full    Dental no notable dental hx.    Pulmonary former smoker,  breath sounds clear to auscultation  Pulmonary exam normal       Cardiovascular Rhythm:regular Rate:Normal     Neuro/Psych    GI/Hepatic GERD-  ,  Endo/Other    Renal/GU      Musculoskeletal   Abdominal   Peds  Hematology   Anesthesia Other Findings   Reproductive/Obstetrics                           Anesthesia Physical Anesthesia Plan  ASA: II  Anesthesia Plan:    Post-op Pain Management:    Induction: Intravenous  Airway Management Planned: LMA  Additional Equipment:   Intra-op Plan:   Post-operative Plan:   Informed Consent: I have reviewed the patients History and Physical, chart, labs and discussed the procedure including the risks, benefits and alternatives for the proposed anesthesia with the patient or authorized representative who has indicated his/her understanding and acceptance.   Dental Advisory Given and Dental advisory given  Plan Discussed with: CRNA and Surgeon  Anesthesia Plan Comments:         Anesthesia Quick Evaluation

## 2013-11-01 ENCOUNTER — Encounter (HOSPITAL_COMMUNITY): Payer: Self-pay | Admitting: Obstetrics and Gynecology

## 2013-11-01 NOTE — Op Note (Signed)
NAMECIDNEY, KIRKWOOD NO.:  [id number]  MEDICAL RECORD NO.:  [id number]  LOCATION:  [facility]                          FACILITY:  [facility]  PHYSICIAN:  Zelphia Cairo, MD    DATE OF BIRTH:  May 03, 1946  DATE OF PROCEDURE: DATE OF DISCHARGE:  10/31/2013                              OPERATIVE REPORT   PREOPERATIVE DIAGNOSES: 1. Postmenopausal bleeding. 2. Endometrial mass.  POSTOPERATIVE DIAGNOSES: 1. Postmenopausal bleeding. 2. Endometrial mass, path pending.  PROCEDURES: 1. Cervical block. 2. Hysteroscopy. 3. D and C. 4. Resection of endometrial polyp using TRUCLEAR.  SURGEON:  Zelphia Cairo, MD  ANESTHESIA:  General.  COMPLICATIONS:  None.  CONDITION:  Stable to recovery room.  DESCRIPTION OF PROCEDURE:  The patient was taken to the operating room. After informed consent was obtained, she was given general anesthesia. Placed in the dorsal lithotomy position using Allen stirrups.  She was prepped and draped in sterile fashion.  An in- and out-catheter was used to drain her bladder for an unmeasured amount of urine.  Bivalve speculum was placed in the vagina and 1% lidocaine was used to perform a cervical block.  Single-tooth tenaculum was attached to the anterior lip of the cervix.  The cervix was serially dilated using Pratt dilators and hysteroscope was inserted.  Two polypoid masses were noted within the endometrial cavity.  Bilateral ostia were visualized and appeared normal.  Hysteroscope was removed and polyp forceps were introduced, 1 polyp was easily grasped, twisted and removed with polyp forceps.  The TRUCLEAR device was then inserted and the 2nd polyp was removed completely to the base.  The 1st polyp was resected at the base as well to ensure complete resection.  After resection, there were no masses or abnormalities throughout the cavity.  Bilateral ostia appeared normal.  Hysteroscope and TRUCLEAR were removed from the uterus.  The  tenaculum was removed.  The cervix was hemostatic. Speculum was removed.  She was extubated and taken to the recovery room in stable condition.  Sponge, lap, needle, and instrument counts were correct x2.     Zelphia Cairo, MD     GA/MEDQ  D:  10/31/2013  T:  11/01/2013  Job:  [id number]

## 2013-11-21 DIAGNOSIS — R350 Frequency of micturition: Secondary | ICD-10-CM | POA: Diagnosis not present

## 2013-11-21 DIAGNOSIS — Z09 Encounter for follow-up examination after completed treatment for conditions other than malignant neoplasm: Secondary | ICD-10-CM | POA: Diagnosis not present

## 2013-11-21 DIAGNOSIS — N390 Urinary tract infection, site not specified: Secondary | ICD-10-CM | POA: Diagnosis not present

## 2013-11-23 DIAGNOSIS — N760 Acute vaginitis: Secondary | ICD-10-CM | POA: Diagnosis not present

## 2013-11-23 DIAGNOSIS — N390 Urinary tract infection, site not specified: Secondary | ICD-10-CM | POA: Diagnosis not present

## 2013-12-20 DIAGNOSIS — M79609 Pain in unspecified limb: Secondary | ICD-10-CM | POA: Diagnosis not present

## 2013-12-20 DIAGNOSIS — I8310 Varicose veins of unspecified lower extremity with inflammation: Secondary | ICD-10-CM | POA: Diagnosis not present

## 2013-12-26 DIAGNOSIS — M79609 Pain in unspecified limb: Secondary | ICD-10-CM | POA: Diagnosis not present

## 2013-12-27 ENCOUNTER — Ambulatory Visit (INDEPENDENT_AMBULATORY_CARE_PROVIDER_SITE_OTHER): Payer: Medicare Other | Admitting: Family Medicine

## 2013-12-27 ENCOUNTER — Encounter: Payer: Self-pay | Admitting: Family Medicine

## 2013-12-27 VITALS — BP 134/70 | HR 66 | Temp 97.8°F | Wt 174.0 lb

## 2013-12-27 DIAGNOSIS — L608 Other nail disorders: Secondary | ICD-10-CM

## 2013-12-27 DIAGNOSIS — E785 Hyperlipidemia, unspecified: Secondary | ICD-10-CM | POA: Diagnosis not present

## 2013-12-27 DIAGNOSIS — L601 Onycholysis: Secondary | ICD-10-CM

## 2013-12-27 NOTE — Progress Notes (Signed)
Pre visit review using our clinic review tool, if applicable. No additional management support is needed unless otherwise documented below in the visit note. 

## 2013-12-27 NOTE — Progress Notes (Signed)
   Subjective:    Patient ID: Shelly [name], female    DOB: Oct 02, 1946, 68 y.o.   MRN: [id number]  HPI Pt seen with separation great toe nails from tissue beneath.   Minimal thickening of left great toenail.  No brittle changes  No pain.  No hx of DM. First noted some separation several months ago.  Hyperlipidemia.  Currently off statins and pt wants to try low dose Crestor again.  Past Medical History  Diagnosis Date  . Diverticulitis   . Hyperlipidemia   . Colon polyps   . IBS (irritable bowel syndrome)   . SVD (spontaneous vaginal delivery)     x 1  . Missed abortion     x 1 - resolved - no surgery  . GERD (gastroesophageal reflux disease)     occasional - diet controlled  . Arthritis     wrist, shoulder    Past Surgical History  Procedure Laterality Date  . Tonsillectomy  1952  . Wisdom tooth extraction    . Dilation and curettage of uterus    . Diagnostic laparoscopy      cysts  . Colonoscopy    . Dilatation & curettage/hysteroscopy with trueclear N/A 10/31/2013    Procedure: DILATATION & CURETTAGE/HYSTEROSCOPY WITH TRUCLEAR;  Surgeon: Marylynn Pearson, MD;  Location: [facility];  Service: Gynecology;  Laterality: N/A;    reports that she quit smoking about 6 years ago. Her smoking use included Cigarettes. She has a 2 pack-year smoking history. She has never used smokeless tobacco. She reports that she drinks about 8.4 ounces of alcohol per week. She reports that she does not use illicit drugs. family history includes Cancer in her father and other; Depression in her brother; Diabetes in her mother; Heart disease in her other; Hyperlipidemia in her father and mother; Hypertension in her mother and other; Leukemia in her mother. Allergies  Allergen Reactions  . Crestor [Rosuvastatin Calcium]     GI upset  . Lipitor [Atorvastatin Calcium] Itching  . Pravastatin     GI upset  . Sulfonamide Derivatives     Hives, itiching    Review of Systems  Constitutional: Negative  for fatigue.  Eyes: Negative for visual disturbance.  Respiratory: Negative for cough, chest tightness, shortness of breath and wheezing.   Cardiovascular: Negative for chest pain, palpitations and leg swelling.  Neurological: Negative for dizziness, seizures, syncope, weakness, light-headedness and headaches.       Objective:   Physical Exam  Constitutional: She appears well-developed and well-nourished.  Cardiovascular: Normal rate.   Pulmonary/Chest: Effort normal and breath sounds normal. No respiratory distress. She has no wheezes. She has no rales.  Skin:  Patient has some separation of distal great toenails from tissue underneath. No thickening. No brittle changes. Good distal foot pulses.          Assessment & Plan:  #1 probable onychcolysis of the toenails. we discussed options of getting fungal culture but suspect this is more likely onychomycosis. Patient wishes to observe. #2 hyperlipidemia. Re-try Crestor 5 mg every other day. She'll slowly progresses to daily if tolerated. Recheck lipids 2 months

## 2014-01-16 DIAGNOSIS — I8310 Varicose veins of unspecified lower extremity with inflammation: Secondary | ICD-10-CM | POA: Diagnosis not present

## 2014-01-26 DIAGNOSIS — M7989 Other specified soft tissue disorders: Secondary | ICD-10-CM | POA: Diagnosis not present

## 2014-02-12 DIAGNOSIS — M654 Radial styloid tenosynovitis [de Quervain]: Secondary | ICD-10-CM | POA: Diagnosis not present

## 2014-02-26 ENCOUNTER — Other Ambulatory Visit: Payer: Self-pay

## 2014-02-26 DIAGNOSIS — Z1231 Encounter for screening mammogram for malignant neoplasm of breast: Secondary | ICD-10-CM

## 2014-04-18 ENCOUNTER — Encounter (INDEPENDENT_AMBULATORY_CARE_PROVIDER_SITE_OTHER): Payer: Self-pay

## 2014-04-18 ENCOUNTER — Ambulatory Visit
Admission: RE | Admit: 2014-04-18 | Discharge: 2014-04-18 | Disposition: A | Discharge status: Home / Self Care | Payer: Medicare Other | Source: Ambulatory Visit

## 2014-04-18 DIAGNOSIS — Z1231 Encounter for screening mammogram for malignant neoplasm of breast: Secondary | ICD-10-CM | POA: Diagnosis not present

## 2014-04-19 DIAGNOSIS — R319 Hematuria, unspecified: Secondary | ICD-10-CM | POA: Diagnosis not present

## 2014-04-19 DIAGNOSIS — Z01419 Encounter for gynecological examination (general) (routine) without abnormal findings: Secondary | ICD-10-CM | POA: Diagnosis not present

## 2014-04-24 DIAGNOSIS — R319 Hematuria, unspecified: Secondary | ICD-10-CM | POA: Diagnosis not present

## 2014-04-24 DIAGNOSIS — N390 Urinary tract infection, site not specified: Secondary | ICD-10-CM | POA: Diagnosis not present

## 2014-04-24 DIAGNOSIS — R1032 Left lower quadrant pain: Secondary | ICD-10-CM | POA: Diagnosis not present

## 2014-05-14 ENCOUNTER — Encounter: Payer: Self-pay | Admitting: Family Medicine

## 2014-05-14 ENCOUNTER — Ambulatory Visit (INDEPENDENT_AMBULATORY_CARE_PROVIDER_SITE_OTHER): Payer: Medicare Other | Admitting: Family Medicine

## 2014-05-14 VITALS — BP 130/80 | HR 74 | Temp 97.9°F | Wt 175.0 lb

## 2014-05-14 DIAGNOSIS — R319 Hematuria, unspecified: Secondary | ICD-10-CM

## 2014-05-14 DIAGNOSIS — Z23 Encounter for immunization: Secondary | ICD-10-CM

## 2014-05-14 LAB — POCT URINALYSIS DIPSTICK
Bilirubin, UA: NEGATIVE
Glucose, UA: NEGATIVE
Ketones, UA: NEGATIVE
Leukocytes, UA: NEGATIVE
Nitrite, UA: NEGATIVE
Protein, UA: NEGATIVE
Spec Grav, UA: <=1.005
Urobilinogen, UA: 0.2
pH, UA: 5.5

## 2014-05-14 LAB — URINALYSIS, MICROSCOPIC ONLY: WBC, UA: NONE SEEN (ref 0–?)

## 2014-05-14 NOTE — Progress Notes (Signed)
Pre visit review using our clinic review tool, if applicable. No additional management support is needed unless otherwise documented below in the visit note. 

## 2014-05-14 NOTE — Progress Notes (Signed)
   Subjective:    Patient ID: Shelly [name], female    DOB: 1946/09/05, 68 y.o.   MRN: [id number]  Urinary Frequency  Associated symptoms include frequency. Pertinent negatives include no flank pain or hematuria.   Patient here urine frequency and recent hematuria by urine dipstick per urology office. She states she had urine culture which came back negative. No burning with urination. No fevers or chills. No history of gross hematuria. No flank pain. She's not any recent appetite or weight changes.  Has some urine frequency and mild urgency. No stress incontinence symptoms.  Past Medical History  Diagnosis Date  . Diverticulitis   . Hyperlipidemia   . Colon polyps   . IBS (irritable bowel syndrome)   . SVD (spontaneous vaginal delivery)     x 1  . Missed abortion     x 1 - resolved - no surgery  . GERD (gastroesophageal reflux disease)     occasional - diet controlled  . Arthritis     wrist, shoulder    Past Surgical History  Procedure Laterality Date  . Tonsillectomy  1952  . Wisdom tooth extraction    . Dilation and curettage of uterus    . Diagnostic laparoscopy      cysts  . Colonoscopy    . Dilatation & curettage/hysteroscopy with trueclear N/A 10/31/2013    Procedure: DILATATION & CURETTAGE/HYSTEROSCOPY WITH TRUCLEAR;  Surgeon: Marylynn Pearson, MD;  Location: [facility];  Service: Gynecology;  Laterality: N/A;    reports that she quit smoking about 7 years ago. Her smoking use included Cigarettes. She has a 2 pack-year smoking history. She has never used smokeless tobacco. She reports that she drinks about 8.4 ounces of alcohol per week. She reports that she does not use illicit drugs. family history includes Cancer in her father and other; Depression in her brother; Diabetes in her mother; Heart disease in her other; Hyperlipidemia in her father and mother; Hypertension in her mother and other; Leukemia in her mother. Allergies  Allergen Reactions  . Crestor  [Rosuvastatin Calcium]     GI upset  . Lipitor [Atorvastatin Calcium] Itching  . Pravastatin     GI upset  . Sulfonamide Derivatives     Hives, itiching      Review of Systems  Constitutional: Negative for appetite change and unexpected weight change.  Genitourinary: Positive for frequency. Negative for hematuria, flank pain, decreased urine volume and difficulty urinating.       Objective:   Physical Exam  Constitutional: She appears well-developed and well-nourished.  Cardiovascular: Normal rate and regular rhythm.   Pulmonary/Chest: Effort normal and breath sounds normal. No respiratory distress. She has no wheezes. She has no rales.          Assessment & Plan:  Hematuria by urine dipstick. Urine repeated today again shows trace blood with negative nitrites or leukocytes. Send urine micro. No further evaluation unless she has greater than 3 red blood cells per high-power field

## 2014-05-16 ENCOUNTER — Ambulatory Visit: Payer: Medicare Other | Admitting: Family Medicine

## 2014-07-02 DIAGNOSIS — I8310 Varicose veins of unspecified lower extremity with inflammation: Secondary | ICD-10-CM | POA: Diagnosis not present

## 2014-07-03 ENCOUNTER — Encounter: Payer: Self-pay | Admitting: *Deleted

## 2014-07-18 ENCOUNTER — Ambulatory Visit (INDEPENDENT_AMBULATORY_CARE_PROVIDER_SITE_OTHER): Payer: Medicare Other | Admitting: Family Medicine

## 2014-07-18 DIAGNOSIS — Z23 Encounter for immunization: Secondary | ICD-10-CM | POA: Diagnosis not present

## 2014-07-25 ENCOUNTER — Encounter: Payer: Self-pay | Admitting: Internal Medicine

## 2014-08-17 ENCOUNTER — Encounter: Payer: Self-pay | Admitting: Internal Medicine

## 2014-08-21 ENCOUNTER — Other Ambulatory Visit (INDEPENDENT_AMBULATORY_CARE_PROVIDER_SITE_OTHER): Payer: Medicare Other

## 2014-08-21 ENCOUNTER — Encounter: Payer: Self-pay | Admitting: Nurse Practitioner

## 2014-08-21 ENCOUNTER — Ambulatory Visit (INDEPENDENT_AMBULATORY_CARE_PROVIDER_SITE_OTHER): Payer: Medicare Other | Admitting: Nurse Practitioner

## 2014-08-21 VITALS — BP 138/80 | HR 74 | Ht 66.0 in | Wt 173.0 lb

## 2014-08-21 DIAGNOSIS — K589 Irritable bowel syndrome without diarrhea: Secondary | ICD-10-CM

## 2014-08-21 DIAGNOSIS — R1084 Generalized abdominal pain: Secondary | ICD-10-CM | POA: Diagnosis not present

## 2014-08-21 DIAGNOSIS — Z86010 Personal history of colonic polyps: Secondary | ICD-10-CM

## 2014-08-21 LAB — CBC WITH DIFFERENTIAL/PLATELET
Basophils Absolute: 0 10*3/uL (ref 0.0–0.1)
Basophils Relative: 0.7 % (ref 0.0–3.0)
Eosinophils Absolute: 0.1 10*3/uL (ref 0.0–0.7)
Eosinophils Relative: 1.1 % (ref 0.0–5.0)
HCT: 40 % (ref 36.0–46.0)
Hemoglobin: 13.4 g/dL (ref 12.0–15.0)
Lymphocytes Relative: 31.7 % (ref 12.0–46.0)
Lymphs Abs: 2.2 10*3/uL (ref 0.7–4.0)
MCHC: 33.6 g/dL (ref 30.0–36.0)
MCV: 87 fl (ref 78.0–100.0)
Monocytes Absolute: 0.6 10*3/uL (ref 0.1–1.0)
Monocytes Relative: 8.7 % (ref 3.0–12.0)
Neutro Abs: 4.1 10*3/uL (ref 1.4–7.7)
Neutrophils Relative %: 57.8 % (ref 43.0–77.0)
Platelets: 337 10*3/uL (ref 150.0–400.0)
RBC: 4.59 Mil/uL (ref 3.87–5.11)
RDW: 14 % (ref 11.5–15.5)
WBC: 7 10*3/uL (ref 4.0–10.5)

## 2014-08-21 NOTE — Progress Notes (Signed)
Reviewed and agree.

## 2014-08-21 NOTE — Progress Notes (Signed)
HPI :  Shelly [name] is a 68 year old female known to Dr. Olevia Perches. She has a history of GERD, severe sigmoid diverticulosis and a [facility] of colon polyps.  She received a colonoscopy recall letter but wanted to come in to discuss some recent GI problems. Shelly [name] gives a history of IBS and takes Librax as needed. A few weeks ago Shelly [name] took advil followed by a glass of wine. Later in the evening she developed fever, chills, and crampy diarrhea. Symptoms were transient. Shelly [name] felt fine until last week when the same exact thing happened after advil and a glass of wine. She is feeling much better, having only one BM a day, no further fevers. Taking BID Librax and no further abdominal cramps   Past Medical History  Diagnosis Date  . Diverticulitis   . Hyperlipidemia   . Hx of adenomatous colonic polyps   . IBS (irritable bowel syndrome)   . SVD (spontaneous vaginal delivery)     x 1  . Missed abortion     x 1 - resolved - no surgery  . GERD (gastroesophageal reflux disease)     occasional - diet controlled  . Arthritis     wrist, shoulder     Family History  Problem Relation Age of Onset  . Hyperlipidemia Mother   . Hypertension Mother   . Diabetes Mother     type ll  . Leukemia Mother   . Lung cancer Father   . Hyperlipidemia Father   . Uterine cancer Other     grandmother  . Heart disease Other   . Hypertension Other   . Depression Brother    History  Substance Use Topics  . Smoking status: Former Smoker -- 0.10 packs/day for 20 years    Types: Cigarettes    Quit date: 05/15/2007  . Smokeless tobacco: Never Used  . Alcohol Use: 8.4 oz/week    14 Glasses of wine per week     Comment: 2 glasses wine every evening   Current Outpatient Prescriptions  Medication Sig Dispense Refill  . acetaminophen (TYLENOL) 500 MG tablet Take 500 mg by mouth every 6 (six) hours as needed for headache.      . clidinium-chlordiazePOXIDE (LIBRAX) 5-2.5 MG per capsule Take 1 capsule by mouth daily  as needed (IBS).      . [DISCONTINUED] omeprazole (PRILOSEC) 40 MG capsule Take 40 mg by mouth daily.        . [DISCONTINUED] pravastatin (PRAVACHOL) 40 MG tablet Take 1 tablet (40 mg total) by mouth every evening.  30 tablet  3   No current facility-administered medications for this visit.   Allergies  Allergen Reactions  . Crestor [Rosuvastatin Calcium]     GI upset  . Lipitor [Atorvastatin Calcium] Itching  . Pravastatin     GI upset  . Sulfonamide Derivatives     Hives, itiching    Review of Systems: All systems reviewed and negative except where noted in HPI.   Physical Exam: BP 138/80  Pulse 74  Ht 5\' 6"  (1.676 m)  Wt 173 lb (78.472 kg)  BMI 27.94 kg/m2 Constitutional: Pleasant,well-developed, white female in no acute distress. HEENT: Normocephalic and atraumatic. Conjunctivae are normal. No scleral icterus. Neck supple.  Cardiovascular: Normal rate, regular rhythm.  Pulmonary/chest: Effort normal and breath sounds normal. No wheezing, rales or rhonchi. Abdominal: Soft, nondistended, nontender. Bowel sounds active throughout. There are no masses palpable. No hepatomegaly. Extremities: no edema Lymphadenopathy: No cervical adenopathy noted. Neurological: Alert and  oriented to person place and time. Skin: Skin is warm and dry. No rashes noted. Psychiatric: Normal mood and affect. Behavior is normal.   ASSESSMENT AND PLAN:  68 year old female with two episodes of fever and crampy loose stool in the last few weeks. She is feeling better now and felt okay prior to, and in between episodes.  Despite fever it seems unlikely that both episodes would be of secondary to an infectious process. IBS doesn't fevers and symptoms not typical for diverticulitis. At any rate, Shelly [name] looks and feels okay today, no fever in several days. Her abdominal exam is not overly concerning. Will proceed with screening colonoscopy next month though she understands that it may need to be postponed  for recurrent fevers / abdominal pain. Will obtain a CBC today. Continue prn librax.

## 2014-08-21 NOTE — Patient Instructions (Signed)
You have been scheduled for a colonoscopy. Please follow written instructions given to you at your visit today.  Please pick up your prep kit at the pharmacy within the next 1-3 days. If you use inhalers (even only as needed), please bring them with you on the day of your procedure. Your physician has requested that you go to www.startemmi.com and enter the access code given to you at your visit today. This web site gives a general overview about your procedure. However, you should still follow specific instructions given to you by our office regarding your preparation for the procedure.  Your physician has requested that you go to the basement for the following lab work before leaving today: CBC

## 2014-09-12 ENCOUNTER — Encounter: Payer: Self-pay | Admitting: Internal Medicine

## 2014-09-12 ENCOUNTER — Ambulatory Visit (AMBULATORY_SURGERY_CENTER): Payer: Medicare Other | Admitting: Internal Medicine

## 2014-09-12 VITALS — BP 135/65 | HR 65 | Temp 98.0°F | Resp 16 | Ht 66.0 in | Wt 173.0 lb

## 2014-09-12 DIAGNOSIS — D120 Benign neoplasm of cecum: Secondary | ICD-10-CM

## 2014-09-12 DIAGNOSIS — Z86010 Personal history of colonic polyps: Secondary | ICD-10-CM | POA: Diagnosis not present

## 2014-09-12 DIAGNOSIS — K589 Irritable bowel syndrome without diarrhea: Secondary | ICD-10-CM | POA: Diagnosis not present

## 2014-09-12 DIAGNOSIS — R109 Unspecified abdominal pain: Secondary | ICD-10-CM | POA: Diagnosis not present

## 2014-09-12 MED ORDER — SODIUM CHLORIDE 0.9 % IV SOLN: 500.0000 mL | INTRAVENOUS | Status: DC

## 2014-09-12 NOTE — Op Note (Signed)
[place]  [address] [place], [address]   COLONOSCOPY PROCEDURE REPORT  PATIENT: Shelly [name], Shelly [name]  MR#: [id number] BIRTHDATE: November 21, 1945 , 31  yrs. old GENDER: female ENDOSCOPIST: Lafayette Dragon, MD REFERRED UL:AGTXM Burchette, M.D. PROCEDURE DATE:  09/12/2014 PROCEDURE:   Colonoscopy with cold biopsy polypectomy First Screening Colonoscopy - Avg.  risk and is 50 yrs.  old or older - No.  Prior Negative Screening - Now for repeat screening. N/A  History of Adenoma - Now for follow-up colonoscopy & has been > or = to 3 yrs.  Yes hx of adenoma.  Has been 3 or more years since last colonoscopy.  Polyps Removed Today? Yes. ASA CLASS:   Class II INDICATIONS:aadenomatous and hyperplastic polyp removed in December 2010.  Prior colonoscopy in 2005.  Family history of colon polyps.  MEDICATIONS: Monitored anesthesia care and Propofol 300 mg IV  DESCRIPTION OF PROCEDURE:   After the risks benefits and alternatives of the procedure were thoroughly explained, informed consent was obtained.  The digital rectal exam revealed no abnormalities of the rectum.   The LB [id number]-[id number]  endoscope was introduced through the anus and advanced to the cecum, which was identified by both the appendix and ileocecal valve. No adverse events experienced.   The quality of the prep was good, using MoviPrep  The instrument was then slowly withdrawn as the colon was fully examined.      COLON FINDINGS: A sessile polyp measuring 3 mm in size was found at the cecum.  A polypectomy was performed with cold forceps.  The resection was complete, the polyp tissue was completely retrieved and sent to histology.   There was moderate diverticulosis noted in the descending colon and sigmoid colon with associated muscular hypertrophy, angulation and tortuosity.  Retroflexed views revealed no abnormalities. The time to cecum=7 minutes 32 seconds. Withdrawal time=10 minutes 25 seconds.  The  scope was withdrawn and the procedure completed. COMPLICATIONS: There were no immediate complications.  ENDOSCOPIC IMPRESSION: 1.   Sessile polyp was found at the cecum; polypectomy was performed with cold forceps 2.   There was moderate diverticulosis noted in the descending colon and sigmoid colon  RECOMMENDATIONS: 1.  Await pathology results 2.  High fiber diet fiber supplement Benefiber 1 tablespoon daily Recall colonoscopy pending path report  eSigned:  Lafayette Dragon, MD 09/12/2014 2:03 PM   cc:   PATIENT NAME:  Shelly [name], Shelly [name] MR#: [id number]

## 2014-09-12 NOTE — Progress Notes (Signed)
Pt awake alert and oriented 3, VSS, pleased with MAC. Report to RN.DRM

## 2014-09-12 NOTE — Patient Instructions (Signed)
High fiber diet.  Benefiber 1 tablespoon daily.   YOU HAD AN ENDOSCOPIC PROCEDURE TODAY AT [facility]: Refer to the procedure report that was given to you for any specific questions about what was found during the examination.  If the procedure report does not answer your questions, please call your gastroenterologist to clarify.  If you requested that your care partner not be given the details of your procedure findings, then the procedure report has been included in a sealed envelope for you to review at your convenience later.  YOU SHOULD EXPECT: Some feelings of bloating in the abdomen. Passage of more gas than usual.  Walking can help get rid of the air that was put into your GI tract during the procedure and reduce the bloating. If you had a lower endoscopy (such as a colonoscopy or flexible sigmoidoscopy) you may notice spotting of blood in your stool or on the toilet paper. If you underwent a bowel prep for your procedure, then you may not have a normal bowel movement for a few days.  DIET: Your first meal following the procedure should be a light meal and then it is ok to progress to your normal diet.  A half-sandwich or bowl of soup is an example of a good first meal.  Heavy or fried foods are harder to digest and may make you feel nauseous or bloated.  Likewise meals heavy in dairy and vegetables can cause extra gas to form and this can also increase the bloating.  Drink plenty of fluids but you should avoid alcoholic beverages for 24 hours.  ACTIVITY: Your care partner should take you home directly after the procedure.  You should plan to take it easy, moving slowly for the rest of the day.  You can resume normal activity the day after the procedure however you should NOT DRIVE or use heavy machinery for 24 hours (because of the sedation medicines used during the test).    SYMPTOMS TO REPORT IMMEDIATELY: A gastroenterologist can be reached at any hour.  During normal  business hours, 8:30 AM to 5:00 PM Monday through Friday, call [phone].  After hours and on weekends, please call the GI answering service at [phone] who will take a message and have the physician on call contact you.   Following lower endoscopy (colonoscopy or flexible sigmoidoscopy):  Excessive amounts of blood in the stool  Significant tenderness or worsening of abdominal pains  Swelling of the abdomen that is new, acute  Fever of 100F or higher  FOLLOW UP: If any biopsies were taken you will be contacted by phone or by letter within the next 1-3 weeks.  Call your gastroenterologist if you have not heard about the biopsies in 3 weeks.  Our staff will call the home number listed on your records the next business day following your procedure to check on you and address any questions or concerns that you may have at that time regarding the information given to you following your procedure. This is a courtesy call and so if there is no answer at the home number and we have not heard from you through the emergency physician on call, we will assume that you have returned to your regular daily activities without incident.  SIGNATURES/CONFIDENTIALITY: You and/or your care partner have signed paperwork which will be entered into your electronic medical record.  These signatures attest to the fact that that the information above on your After Visit Summary has been reviewed and  is understood.  Full responsibility of the confidentiality of this discharge information lies with you and/or your care-partner.

## 2014-09-12 NOTE — Progress Notes (Signed)
Patient denies any allergies to eggs or soy. Patient denies any problems with anesthesia/sedation. Patient denies any oxygen use at home.

## 2014-09-12 NOTE — Progress Notes (Signed)
Called to room to assist during endoscopic procedure.  Patient ID and intended procedure confirmed with present staff. Received instructions for my participation in the procedure from the performing physician.  

## 2014-09-13 ENCOUNTER — Telehealth: Payer: Self-pay

## 2014-09-13 NOTE — Telephone Encounter (Signed)
  Follow up Call-  Call back number 09/12/2014  Post procedure Call Back phone  # [phone]  Permission to leave phone message Yes     Patient questions:  Do you have a fever, pain , or abdominal swelling? No. Pain Score  0 *  Have you tolerated food without any problems? Yes.    Have you been able to return to your normal activities? Yes.    Do you have any questions about your discharge instructions: Diet   No. Medications  No. Follow up visit  No.  Do you have questions or concerns about your Care? No.  Actions: * If pain score is 4 or above: No action needed, pain <4.

## 2014-09-19 ENCOUNTER — Encounter: Payer: Self-pay | Admitting: Internal Medicine

## 2014-09-24 DIAGNOSIS — I83811 Varicose veins of right lower extremities with pain: Secondary | ICD-10-CM | POA: Diagnosis not present

## 2014-09-24 DIAGNOSIS — I8311 Varicose veins of right lower extremity with inflammation: Secondary | ICD-10-CM | POA: Diagnosis not present

## 2014-09-26 DIAGNOSIS — I83811 Varicose veins of right lower extremities with pain: Secondary | ICD-10-CM | POA: Diagnosis not present

## 2014-09-26 DIAGNOSIS — I8311 Varicose veins of right lower extremity with inflammation: Secondary | ICD-10-CM | POA: Diagnosis not present

## 2014-10-08 DIAGNOSIS — M79651 Pain in right thigh: Secondary | ICD-10-CM | POA: Diagnosis not present

## 2014-10-08 DIAGNOSIS — I8311 Varicose veins of right lower extremity with inflammation: Secondary | ICD-10-CM | POA: Diagnosis not present

## 2015-01-21 ENCOUNTER — Other Ambulatory Visit: Payer: Self-pay

## 2015-01-21 ENCOUNTER — Other Ambulatory Visit: Payer: Self-pay | Admitting: Family Medicine

## 2015-01-21 DIAGNOSIS — D225 Melanocytic nevi of trunk: Secondary | ICD-10-CM | POA: Diagnosis not present

## 2015-01-21 DIAGNOSIS — L820 Inflamed seborrheic keratosis: Secondary | ICD-10-CM | POA: Diagnosis not present

## 2015-01-31 ENCOUNTER — Other Ambulatory Visit: Payer: Self-pay

## 2015-01-31 DIAGNOSIS — Z1231 Encounter for screening mammogram for malignant neoplasm of breast: Secondary | ICD-10-CM

## 2015-02-15 ENCOUNTER — Ambulatory Visit: Payer: Medicare Other | Admitting: Family Medicine

## 2015-02-21 DIAGNOSIS — H5203 Hypermetropia, bilateral: Secondary | ICD-10-CM | POA: Diagnosis not present

## 2015-02-21 DIAGNOSIS — H2510 Age-related nuclear cataract, unspecified eye: Secondary | ICD-10-CM | POA: Diagnosis not present

## 2015-02-21 DIAGNOSIS — H52223 Regular astigmatism, bilateral: Secondary | ICD-10-CM | POA: Diagnosis not present

## 2015-04-23 ENCOUNTER — Ambulatory Visit: Payer: Medicare Other

## 2015-04-24 ENCOUNTER — Ambulatory Visit
Admission: RE | Admit: 2015-04-24 | Discharge: 2015-04-24 | Disposition: A | Discharge status: Home / Self Care | Payer: Medicare Other | Source: Ambulatory Visit

## 2015-04-24 DIAGNOSIS — Z1231 Encounter for screening mammogram for malignant neoplasm of breast: Secondary | ICD-10-CM

## 2015-05-01 ENCOUNTER — Ambulatory Visit: Payer: Self-pay | Admitting: Gynecology

## 2015-05-22 ENCOUNTER — Encounter: Payer: Self-pay | Admitting: Internal Medicine

## 2015-05-24 ENCOUNTER — Encounter: Payer: Self-pay | Admitting: Internal Medicine

## 2015-06-07 DIAGNOSIS — H1045 Other chronic allergic conjunctivitis: Secondary | ICD-10-CM | POA: Diagnosis not present

## 2015-06-12 ENCOUNTER — Encounter: Payer: Self-pay | Admitting: Gynecology

## 2015-06-12 ENCOUNTER — Ambulatory Visit (INDEPENDENT_AMBULATORY_CARE_PROVIDER_SITE_OTHER): Payer: Medicare Other | Admitting: Gynecology

## 2015-06-12 VITALS — BP 130/86 | Ht 65.0 in | Wt 176.0 lb

## 2015-06-12 DIAGNOSIS — Z01419 Encounter for gynecological examination (general) (routine) without abnormal findings: Secondary | ICD-10-CM

## 2015-06-12 DIAGNOSIS — M810 Age-related osteoporosis without current pathological fracture: Secondary | ICD-10-CM | POA: Diagnosis not present

## 2015-06-12 NOTE — Patient Instructions (Signed)
Bone Densitometry Bone densitometry is a special X-ray that measures your bone density and can be used to help predict your risk of bone fractures. This test is used to determine bone mineral content and density to diagnose osteoporosis. Osteoporosis is the loss of bone that may cause the bone to become weak. Osteoporosis commonly occurs in women entering menopause. However, it may be found in men and in people with other diseases. PREPARATION FOR TEST No preparation necessary. WHO SHOULD BE TESTED?  All women older than 54.  Postmenopausal women (50 to 63) with risk factors for osteoporosis.  People with a previous fracture caused by normal activities.  People with a small body frame (less than 127 poundsor a body mass index [BMI] of less than 21).  People who have a parent with a hip fracture or history of osteoporosis.  People who smoke.  People who have rheumatoid arthritis.  Anyone who engages in excessive alcohol use (more than 3 drinks most days).  Women who experience early menopause. WHEN SHOULD YOU BE RETESTED? Current guidelines suggest that you should wait at least 2 years before doing a bone density test again if your first test was normal.Recent studies indicated that women with normal bone density may be able to wait a few years before needing to repeat a bone density test. You should discuss this with your caregiver.  NORMAL FINDINGS   Normal: less than standard deviation below normal (greater than -1).  Osteopenia: 1 to 2.5 standard deviations below normal (-1 to -2.5).  Osteoporosis: greater than 2.5 standard deviations below normal (less than -2.5). Test results are reported as a "T score" and a "Z score."The T score is a number that compares your bone density with the bone density of healthy, young women.The Z score is a number that compares your bone density with the scores of women who are the same age, gender, and race.  Ranges for normal findings may vary  among different laboratories and hospitals. You should always check with your doctor after having lab work or other tests done to discuss the meaning of your test results and whether your values are considered within normal limits. MEANING OF TEST  Your caregiver will go over the test results with you and discuss the importance and meaning of your results, as well as treatment options and the need for additional tests if necessary. OBTAINING THE TEST RESULTS It is your responsibility to obtain your test results. Ask the lab or department performing the test when and how you will get your results. Document Released: 11/24/2004 Document Revised: 01/25/2012 Document Reviewed: 12/17/2010 [facility] Patient Information 2015 [place], [place]. This information is not intended to replace advice given to you by your health care provider. Make sure you discuss any questions you have with your health care provider. Osteoporosis Throughout your life, your body breaks down old bone and replaces it with new bone. As you get older, your body does not replace bone as quickly as it breaks it down. By the age of 73 years, most people begin to gradually lose bone because of the imbalance between bone loss and replacement. Some people lose more bone than others. Bone loss beyond a specified normal degree is considered osteoporosis.  Osteoporosis affects the strength and durability of your bones. The inside of the ends of your bones and your flat bones, like the bones of your pelvis, look like honeycomb, filled with tiny open spaces. As bone loss occurs, your bones become less dense. This means that  the open spaces inside your bones become bigger and the walls between these spaces become thinner. This makes your bones weaker. Bones of a person with osteoporosis can become so weak that they can break (fracture) during minor accidents, such as a simple fall. CAUSES  The following factors have been associated with the development  of osteoporosis:  Smoking.  Drinking more than 2 alcoholic drinks several days per week.  Long-term use of certain medicines:  Corticosteroids.  Chemotherapy medicines.  Thyroid medicines.  Antiepileptic medicines.  Gonadal hormone suppression medicine.  Immunosuppression medicine.  Being underweight.  Lack of physical activity.  Lack of exposure to the sun. This can lead to vitamin D deficiency.  Certain medical conditions:  Certain inflammatory bowel diseases, such as Crohn disease and ulcerative colitis.  Diabetes.  Hyperthyroidism.  Hyperparathyroidism. RISK FACTORS Anyone can develop osteoporosis. However, the following factors can increase your risk of developing osteoporosis:  Gender--Women are at higher risk than men.  Age--Being older than 50 years increases your risk.  Ethnicity--White and Asian people have an increased risk.  Weight --Being extremely underweight can increase your risk of osteoporosis.  Family history of osteoporosis--Having a family member who has developed osteoporosis can increase your risk. SYMPTOMS  Usually, people with osteoporosis have no symptoms.  DIAGNOSIS  Signs during a physical exam that may prompt your caregiver to suspect osteoporosis include:  Decreased height. This is usually caused by the compression of the bones that form your spine (vertebrae) because they have weakened and become fractured.  A curving or rounding of the upper back (kyphosis). To confirm signs of osteoporosis, your caregiver may request a procedure that uses 2 low-dose X-ray beams with different levels of energy to measure your bone mineral density (dual-energy X-ray absorptiometry [DXA]). Also, your caregiver may check your level of vitamin D. TREATMENT  The goal of osteoporosis treatment is to strengthen bones in order to decrease the risk of bone fractures. There are different types of medicines available to help achieve this goal. Some of  these medicines work by slowing the processes of bone loss. Some medicines work by increasing bone density. Treatment also involves making sure that your levels of calcium and vitamin D are adequate. PREVENTION  There are things you can do to help prevent osteoporosis. Adequate intake of calcium and vitamin D can help you achieve optimal bone mineral density. Regular exercise can also help, especially resistance and weight-bearing activities. If you smoke, quitting smoking is an important part of osteoporosis prevention. MAKE SURE YOU:  Understand these instructions.  Will watch your condition.  Will get help right away if you are not doing well or get worse. FOR MORE INFORMATION www.osteo.org and EquipmentWeekly.com.ee Document Released: 08/12/2005 Document Revised: 02/27/2013 Document Reviewed: 10/17/2011 [facility] Patient Information 2015 [place], [place]. This information is not intended to replace advice given to you by your health care provider. Make sure you discuss any questions you have with your health care provider.

## 2015-06-12 NOTE — Progress Notes (Signed)
Shelly [name] June 29, 1946 [id number]   History:    69 y.o.  for annual gyn exam who is a new patient to the practice. Patient was formerly being followed by Dr. Lowella [name] where she had been getting her gynecological examination. Dr. Elease [name] is her PCP who has been doing her blood work. Review of her record indicated her last bone density study at another facility was in 2009 and she had been diagnosed with osteoporosis but never took her medication. She also had a colonoscopy in 2015 whereby benign polyps were removed. Patient suffers from IBS/GERD. Patient denies any past history of abnormal Pap smears or of HRT usage. She is sexually active and uses over-the-counter lubricant. Patient's vaccines are up-to-date.  Past medical history,surgical history, family history and social history were all reviewed and documented in the EPIC chart.  Gynecologic History No LMP recorded. Patient is postmenopausal. Contraception: post menopausal status Last Pap: Several years ago. Results were: normal Last mammogram: 2016. Results were: normal  Obstetric History OB History  Gravida Para Term Preterm AB SAB TAB Ectopic Multiple Living  2 1   1 1    1     # Outcome Date GA Lbr Len/2nd Weight Sex Delivery Anes PTL Lv  2 SAB           1 Para     F Vag-Spont          ROS: A ROS was performed and pertinent positives and negatives are included in the history.  GENERAL: No fevers or chills. HEENT: No change in vision, no earache, sore throat or sinus congestion. NECK: No pain or stiffness. CARDIOVASCULAR: No chest pain or pressure. No palpitations. PULMONARY: No shortness of breath, cough or wheeze. GASTROINTESTINAL: No abdominal pain, nausea, vomiting or diarrhea, melena or bright red blood per rectum. GENITOURINARY: No urinary frequency, urgency, hesitancy or dysuria. MUSCULOSKELETAL: No joint or muscle pain, no back pain, no recent trauma. DERMATOLOGIC: No rash, no itching, no lesions. ENDOCRINE: No  polyuria, polydipsia, no heat or cold intolerance. No recent change in weight. HEMATOLOGICAL: No anemia or easy bruising or bleeding. NEUROLOGIC: No headache, seizures, numbness, tingling or weakness. PSYCHIATRIC: No depression, no loss of interest in normal activity or change in sleep pattern.     Exam: chaperone present  BP 130/86 mmHg  Ht 5\' 5"  (1.651 m)  Wt 176 lb (79.833 kg)  BMI 29.29 kg/m2  Body mass index is 29.29 kg/(m^2).  General appearance : Well developed well nourished female. No acute distress HEENT: Eyes: no retinal hemorrhage or exudates,  Neck supple, trachea midline, no carotid bruits, no thyroidmegaly Lungs: Clear to auscultation, no rhonchi or wheezes, or rib retractions  Heart: Regular rate and rhythm, no murmurs or gallops Breast:Examined in sitting and supine position were symmetrical in appearance, no palpable masses or tenderness,  no skin retraction, no nipple inversion, no nipple discharge, no skin discoloration, no axillary or supraclavicular lymphadenopathy Abdomen: no palpable masses or tenderness, no rebound or guarding Extremities: no edema or skin discoloration or tenderness  Pelvic:  Bartholin, Urethra, Skene Glands: Within normal limits             Vagina: No gross lesions or discharge, Atrophic changes  Cervix: No gross lesions or discharge  Uterus  axial, normal size, shape and consistency, non-tender and mobile  Adnexa  Without masses or tenderness  Anus and perineum  normal   Rectovaginal  normal sphincter tone without palpated masses or tenderness  Hemoccult PCP provides     Assessment/Plan:  69 y.o. female for annual exam with past history of osteoporosis did not take medication. Patient will be scheduled for bone density study here in the office in the next several weeks with a consultation with me to discuss the results and plan management. Because of her history of osteoporosis a calcium, vitamin D, PTH level will be drawn  today. Pap smear no longer indicated according to the new guidelines. We discussed importance of calcium vitamin D and weightbearing exercises. We discussed importance of monthly breast exam. PCP to be doing her blood work.  Terrance Mass MD, 11:38 AM 06/12/2015

## 2015-06-13 ENCOUNTER — Encounter: Payer: Self-pay | Admitting: Gynecology

## 2015-06-13 LAB — VITAMIN D 25 HYDROXY (VIT D DEFICIENCY, FRACTURES): Vit D, 25-Hydroxy: 29 ng/mL — ABNORMAL LOW (ref 30–100)

## 2015-06-13 LAB — PTH, INTACT AND CALCIUM
Calcium: 9.4 mg/dL (ref 8.4–10.5)
PTH: 30 pg/mL (ref 14–64)

## 2015-06-27 ENCOUNTER — Ambulatory Visit (INDEPENDENT_AMBULATORY_CARE_PROVIDER_SITE_OTHER): Payer: Medicare Other

## 2015-06-27 ENCOUNTER — Encounter: Payer: Self-pay | Admitting: Gynecology

## 2015-06-27 DIAGNOSIS — M810 Age-related osteoporosis without current pathological fracture: Secondary | ICD-10-CM

## 2015-07-05 ENCOUNTER — Encounter: Payer: Self-pay | Admitting: Gynecology

## 2015-07-12 ENCOUNTER — Ambulatory Visit: Payer: Medicare Other | Admitting: Gynecology

## 2015-07-30 ENCOUNTER — Ambulatory Visit (INDEPENDENT_AMBULATORY_CARE_PROVIDER_SITE_OTHER): Payer: Medicare Other

## 2015-07-30 DIAGNOSIS — Z23 Encounter for immunization: Secondary | ICD-10-CM

## 2015-08-06 ENCOUNTER — Ambulatory Visit: Payer: Medicare Other | Admitting: Gynecology

## 2015-08-27 ENCOUNTER — Encounter: Payer: Self-pay | Admitting: Adult Health

## 2015-08-27 ENCOUNTER — Ambulatory Visit (INDEPENDENT_AMBULATORY_CARE_PROVIDER_SITE_OTHER): Payer: Medicare Other | Admitting: Adult Health

## 2015-08-27 VITALS — BP 140/72 | HR 85 | Temp 98.4°F | Ht 65.0 in | Wt 176.7 lb

## 2015-08-27 DIAGNOSIS — J209 Acute bronchitis, unspecified: Secondary | ICD-10-CM

## 2015-08-27 MED ORDER — PREDNISONE 20 MG PO TABS: 20.0000 mg | ORAL_TABLET | Freq: Every day | ORAL | Status: DC

## 2015-08-27 MED ORDER — HYDROCODONE-HOMATROPINE 5-1.5 MG/5ML PO SYRP: 5.0000 mL | ORAL_SOLUTION | Freq: Three times a day (TID) | ORAL | Status: DC | PRN

## 2015-08-27 NOTE — Progress Notes (Signed)
Pre visit review using our clinic review tool, if applicable. No additional management support is needed unless otherwise documented below in the visit note. 

## 2015-08-27 NOTE — Progress Notes (Signed)
Subjective:    Patient ID: Shelly [name], female    DOB: 08-31-1946, 69 y.o.   MRN: [id number]  HPI  69 year old female who presents to the office today for dry cough and chest congestion with associated sore throat since Friday when she started to feel fatigued. She has been taking Mucinex, which she endorses helps. Her cough has been keeping her at night.   Denies any fevers, nausea, vomiting, SOB, or wheezing. No sick contacts.   Review of Systems  Constitutional: Positive for fatigue. Negative for fever, chills and diaphoresis.  HENT: Positive for congestion and sore throat. Negative for ear discharge, ear pain, rhinorrhea, sinus pressure, sneezing and tinnitus.   Eyes: Negative.   Respiratory: Positive for cough and chest tightness. Negative for shortness of breath.   Cardiovascular: Negative.   Neurological: Negative for dizziness, weakness and headaches.  Hematological: Negative for adenopathy.  Psychiatric/Behavioral: Positive for sleep disturbance.  All other systems reviewed and are negative.  Past Medical History  Diagnosis Date  . Diverticulitis   . Hyperlipidemia   . Hx of adenomatous colonic polyps   . IBS (irritable bowel syndrome)   . SVD (spontaneous vaginal delivery)     x 1  . Missed abortion     x 1 - resolved - no surgery  . GERD (gastroesophageal reflux disease)     occasional - diet controlled  . Arthritis     wrist, shoulder   . Vitamin D deficiency   . Osteoporosis   . Endometrial polyp     Social History   Social History  . Marital Status: Married    Spouse Name: N/A  . Number of Children: N/A  . Years of Education: N/A   Occupational History  . Not on file.   Social History Main Topics  . Smoking status: Former Smoker -- 0.10 packs/day for 20 years    Types: Cigarettes    Quit date: 05/15/2007  . Smokeless tobacco: Never Used  . Alcohol Use: 8.4 oz/week    14 Glasses of wine per week     Comment: 2 glasses wine every evening    . Drug Use: No  . Sexual Activity: Yes    Birth Control/ Protection: Post-menopausal   Other Topics Concern  . Not on file   Social History Narrative    Past Surgical History  Procedure Laterality Date  . Tonsillectomy  1952  . Wisdom tooth extraction    . Dilation and curettage of uterus    . Diagnostic laparoscopy      cysts  . Colonoscopy    . Dilatation & curettage/hysteroscopy with trueclear N/A 10/31/2013    Procedure: DILATATION & CURETTAGE/HYSTEROSCOPY WITH TRUCLEAR;  Surgeon: Marylynn Pearson, MD;  Location: [facility];  Service: Gynecology;  Laterality: N/A;    Family History  Problem Relation Age of Onset  . Hyperlipidemia Mother   . Hypertension Mother   . Diabetes Mother     type ll  . Leukemia Mother   . Lung cancer Father   . Hyperlipidemia Father   . Uterine cancer Other     grandmother  . Heart disease Other   . Hypertension Other   . Depression Brother   . Breast cancer Maternal Aunt   . Uterine cancer Paternal Grandmother   . Breast cancer Cousin 30    Allergies  Allergen Reactions  . Crestor [Rosuvastatin Calcium]     GI upset  . Lipitor [Atorvastatin Calcium] Itching  . Pravastatin  GI upset  . Sulfonamide Derivatives     Hives, itiching    Current Outpatient Prescriptions on File Prior to Visit  Medication Sig Dispense Refill  . acetaminophen (TYLENOL) 500 MG tablet Take 500 mg by mouth every 6 (six) hours as needed for headache.    . clidinium-chlordiazePOXIDE (LIBRAX) 5-2.5 MG per capsule Take 1 capsule by mouth daily as needed (IBS).    . [DISCONTINUED] omeprazole (PRILOSEC) 40 MG capsule Take 40 mg by mouth daily.      . [DISCONTINUED] pravastatin (PRAVACHOL) 40 MG tablet Take 1 tablet (40 mg total) by mouth every evening. 30 tablet 3   No current facility-administered medications on file prior to visit.    BP 140/72 mmHg  Pulse 85  Temp(Src) 98.4 F (36.9 C) (Oral)  Ht 5\' 5"  (1.651 m)  Wt 176 lb 11.2 oz (80.151 kg)  BMI  29.40 kg/m2  SpO2 95%       Objective:   Physical Exam  Constitutional: She is oriented to person, place, and time. She appears well-developed and well-nourished. No distress.  Cardiovascular: Normal rate, regular rhythm, normal heart sounds and intact distal pulses.  Exam reveals no gallop and no friction rub.   No murmur heard. Pulmonary/Chest: Effort normal. No respiratory distress. She has wheezes. She has no rales. She exhibits no tenderness.  Slight wheezing at bilateral bases. Prolonged expiration.   Musculoskeletal: Normal range of motion. She exhibits no edema or tenderness.  Lymphadenopathy:    She has no cervical adenopathy.  Neurological: She is alert and oriented to person, place, and time.  Skin: Skin is warm and dry. No rash noted. She is not diaphoretic. No erythema. No pallor.  Psychiatric: She has a normal mood and affect. Her behavior is normal. Judgment and thought content normal.  Nursing note and vitals reviewed.     Assessment & Plan:  1. Acute bronchitis, unspecified organism - Likely viral. Not concerned for PNA at this time. May be asthma exacerbation  - HYDROcodone-homatropine (HYCODAN) 5-1.5 MG/5ML syrup; Take 5 mLs by mouth every 8 (eight) hours as needed for cough.  Dispense: 120 mL; Refill: 0 - predniSONE (DELTASONE) 20 MG tablet; Take 1 tablet (20 mg total) by mouth daily with breakfast.  Dispense: 9 tablet; Refill: 0

## 2015-08-27 NOTE — Patient Instructions (Addendum)
It was great meeting you today!  Your exam is consistent with bronchitis.   I have sent in a prescription for prednisone. This is a steroid. Please take as directed  Day 1 40 mg Day 2 40 mg Day 3 40 mg Day 4 20 mg Day 5 20 mg Day 6 20 mg   Use the cough syrup at night and Mucinex or Delsym during the day.   Follow up if no improvement in the next 2-3 days.   Acute Bronchitis Bronchitis is inflammation of the airways that extend from the windpipe into the lungs (bronchi). The inflammation often causes mucus to develop. This leads to a cough, which is the most common symptom of bronchitis.  In acute bronchitis, the condition usually develops suddenly and goes away over time, usually in a couple weeks. Smoking, allergies, and asthma can make bronchitis worse. Repeated episodes of bronchitis may cause further lung problems.  CAUSES Acute bronchitis is most often caused by the same virus that causes a cold. The virus can spread from person to person (contagious) through coughing, sneezing, and touching contaminated objects. SIGNS AND SYMPTOMS   Cough.   Fever.   Coughing up mucus.   Body aches.   Chest congestion.   Chills.   Shortness of breath.   Sore throat.  DIAGNOSIS  Acute bronchitis is usually diagnosed through a physical exam. Your health care provider will also ask you questions about your medical history. Tests, such as chest X-rays, are sometimes done to rule out other conditions.  TREATMENT  Acute bronchitis usually goes away in a couple weeks. Oftentimes, no medical treatment is necessary. Medicines are sometimes given for relief of fever or cough. Antibiotic medicines are usually not needed but may be prescribed in certain situations. In some cases, an inhaler may be recommended to help reduce shortness of breath and control the cough. A cool mist vaporizer may also be used to help thin bronchial secretions and make it easier to clear the chest.  HOME CARE  INSTRUCTIONS  Get plenty of rest.   Drink enough fluids to keep your urine clear or pale yellow (unless you have a medical condition that requires fluid restriction). Increasing fluids may help thin your respiratory secretions (sputum) and reduce chest congestion, and it will prevent dehydration.   Take medicines only as directed by your health care provider.  If you were prescribed an antibiotic medicine, finish it all even if you start to feel better.  Avoid smoking and secondhand smoke. Exposure to cigarette smoke or irritating chemicals will make bronchitis worse. If you are a smoker, consider using nicotine gum or skin patches to help control withdrawal symptoms. Quitting smoking will help your lungs heal faster.   Reduce the chances of another bout of acute bronchitis by washing your hands frequently, avoiding people with cold symptoms, and trying not to touch your hands to your mouth, nose, or eyes.   Keep all follow-up visits as directed by your health care provider.  SEEK MEDICAL CARE IF: Your symptoms do not improve after 1 week of treatment.  SEEK IMMEDIATE MEDICAL CARE IF:  You develop an increased fever or chills.   You have chest pain.   You have severe shortness of breath.  You have bloody sputum.   You develop dehydration.  You faint or repeatedly feel like you are going to pass out.  You develop repeated vomiting.  You develop a severe headache. MAKE SURE YOU:   Understand these instructions.  Will watch  your condition.  Will get help right away if you are not doing well or get worse.   This information is not intended to replace advice given to you by your health care provider. Make sure you discuss any questions you have with your health care provider.   Document Released: 12/10/2004 Document Revised: 11/23/2014 Document Reviewed: 04/25/2013 Elsevier Interactive Patient Education [employer].

## 2015-08-28 ENCOUNTER — Telehealth: Payer: Self-pay | Admitting: Family Medicine

## 2015-08-28 NOTE — Telephone Encounter (Signed)
Left a message for pt to return call 

## 2015-08-28 NOTE — Telephone Encounter (Signed)
I am sorry she is not feeling any better. Bronchitis is usually caused by a virus and the average duration is about 18 days. Unfortunately an antibiotic is going to do no good for a viral infection. The prednisone will help with the inflammation in her lungs.  If she has any new fevers greater than 101, then follow up with PCP.

## 2015-08-28 NOTE — Telephone Encounter (Signed)
Pt does not want to take prednisone and would like abx call into [facility]. Pt would like zpak

## 2015-08-28 NOTE — Telephone Encounter (Signed)
Spoke with pt and gave her the info she said she will try and tuff it out.

## 2015-08-29 NOTE — Telephone Encounter (Signed)
Noted! Thank you

## 2015-09-12 ENCOUNTER — Telehealth: Payer: Self-pay | Admitting: Family Medicine

## 2015-09-12 MED ORDER — AZITHROMYCIN 250 MG PO TABS: ORAL_TABLET | ORAL | Status: DC

## 2015-09-12 NOTE — Telephone Encounter (Signed)
Spoke to pt, told her Rx for Z-pak sent to pharmacy but Dr. Elease Hashimoto said he will need to see you if no better in one week. Pt verbalized understanding.

## 2015-09-12 NOTE — Telephone Encounter (Signed)
OK to prescribe Z-pack, but I need to see her back if no better in one week.

## 2015-09-12 NOTE — Telephone Encounter (Signed)
Pt saw Cory on 10/11. Given prednisone and hydrocodone cough medicine.  Pt states she did not do the prednisone.  Pt does not want any more of the cough medicine either. Pt refused to see another provider again. Pt states she has continued to cough and feel bad 3 weeks now. Pt is convinced she needs an antibiotic.  Others around her have had and that is what has helped them. No appointments with Dr Elease Hashimoto, so pt wanted me to ask if he will prescribe z pak.   [facility]

## 2015-10-15 ENCOUNTER — Encounter: Payer: Self-pay | Admitting: Family Medicine

## 2015-10-15 ENCOUNTER — Ambulatory Visit (INDEPENDENT_AMBULATORY_CARE_PROVIDER_SITE_OTHER): Payer: Medicare Other | Admitting: Family Medicine

## 2015-10-15 VITALS — BP 120/70 | HR 89 | Temp 98.2°F | Resp 14 | Ht 65.0 in | Wt 178.6 lb

## 2015-10-15 DIAGNOSIS — R21 Rash and other nonspecific skin eruption: Secondary | ICD-10-CM | POA: Diagnosis not present

## 2015-10-15 MED ORDER — TRIAMCINOLONE ACETONIDE 0.1 % EX CREA: 1.0000 "application " | TOPICAL_CREAM | Freq: Two times a day (BID) | CUTANEOUS | Status: DC | PRN

## 2015-10-15 MED ORDER — FLUOCINOLONE ACETONIDE 0.01 % EX SHAM: 1.0000 "application " | MEDICATED_SHAMPOO | Freq: Every day | CUTANEOUS | Status: DC | PRN

## 2015-10-15 NOTE — Progress Notes (Signed)
Pre visit review using our clinic review tool, if applicable. No additional management support is needed unless otherwise documented below in the visit note. 

## 2015-10-15 NOTE — Patient Instructions (Signed)
Go back to [employer] and leave off the Dial Stop the Pepcid and Tagamet Take Claritan one daily Consider Prevacid for reflux symptoms.   Use Triamcinolone cream as needed.

## 2015-10-15 NOTE — Progress Notes (Signed)
   Subjective:    Patient ID: Shelly [name], female    DOB: 11-24-1945, 69 y.o.   MRN: [id number]  HPI Acute visit for pruritic rash. Onset about 2 weeks ago. Initially involved her scalp and now involves her trunk bilaterally. She has significant pruritus. She has not had any change of soaps of been recently went to [facility]. Previously used [employer]. No history of food allergies. Use hydrocortisone cream which helped. She describes her rash is slightly raised hive-like and non-scaly.   Past Medical History  Diagnosis Date  . Diverticulitis   . Hyperlipidemia   . Hx of adenomatous colonic polyps   . IBS (irritable bowel syndrome)   . SVD (spontaneous vaginal delivery)     x 1  . Missed abortion     x 1 - resolved - no surgery  . GERD (gastroesophageal reflux disease)     occasional - diet controlled  . Arthritis     wrist, shoulder   . Vitamin D deficiency   . Osteoporosis   . Endometrial polyp    Past Surgical History  Procedure Laterality Date  . Tonsillectomy  1952  . Wisdom tooth extraction    . Dilation and curettage of uterus    . Diagnostic laparoscopy      cysts  . Colonoscopy    . Dilatation & curettage/hysteroscopy with trueclear N/A 10/31/2013    Procedure: DILATATION & CURETTAGE/HYSTEROSCOPY WITH TRUCLEAR;  Surgeon: Marylynn Pearson, MD;  Location: [facility];  Service: Gynecology;  Laterality: N/A;    reports that she quit smoking about 8 years ago. Her smoking use included Cigarettes. She has a 2 pack-year smoking history. She has never used smokeless tobacco. She reports that she drinks about 8.4 oz of alcohol per week. She reports that she does not use illicit drugs. family history includes Breast cancer in her maternal aunt; Breast cancer (age of onset: 40) in her cousin; Depression in her brother; Diabetes in her mother; Heart disease in her other; Hyperlipidemia in her father and mother; Hypertension in her mother and other; Leukemia in her mother; Lung cancer in  her father; Uterine cancer in her other and paternal grandmother. Allergies  Allergen Reactions  . Crestor [Rosuvastatin Calcium]     GI upset  . Lipitor [Atorvastatin Calcium] Itching  . Pravastatin     GI upset  . Sulfonamide Derivatives     Hives, itiching      Review of Systems  Constitutional: Negative for fever and chills.  Respiratory: Negative for shortness of breath.   Cardiovascular: Negative for chest pain.  Skin: Positive for rash.  Hematological: Negative for adenopathy.       Objective:   Physical Exam  Constitutional: She appears well-developed and well-nourished.  Cardiovascular: Normal rate and regular rhythm.   Pulmonary/Chest: Effort normal and breath sounds normal. No respiratory distress. She has no wheezes. She has no rales.  Skin: Rash noted.  She has a couple of nonspecific erythematous slightly raised blanching hive-like lesions on her trunk. Otherwise no skin rash noted. Her skin is not particular dry.          Assessment & Plan:  Nonspecific skin rash. Question hives. Discontinue Dial soap. Go back to [employer]. Try over-the-counter antihistamine-1 daily. Avoid scratching as much as possible. Leave off Pepcid and Tagamet which correlated with onset of symptoms and try over-the-counter Prevacid. Triamcinolone 0.1% cream twice daily as needed.

## 2015-10-21 ENCOUNTER — Encounter: Payer: Self-pay | Admitting: Internal Medicine

## 2015-10-21 ENCOUNTER — Ambulatory Visit (INDEPENDENT_AMBULATORY_CARE_PROVIDER_SITE_OTHER): Payer: Medicare Other | Admitting: Internal Medicine

## 2015-10-21 VITALS — BP 154/76 | Temp 98.4°F | Ht 65.0 in | Wt 177.6 lb

## 2015-10-21 DIAGNOSIS — L509 Urticaria, unspecified: Secondary | ICD-10-CM

## 2015-10-21 MED ORDER — PREDNISONE 10 MG PO TABS: ORAL_TABLET | ORAL | Status: DC

## 2015-10-21 NOTE — Patient Instructions (Signed)
Continue antihistamine  Zyrtec  every day .   Generic plain  Can add on  Benadryl for break throught  We can add prednisone   For allergic reaction.  To help it subside .   If not going away with antihistamines  Fu with Dr B if  persistent or progressive    Hives Hives are itchy, red, swollen areas of the skin. They can vary in size and location on your body. Hives can come and go for hours or several days (acute hives) or for several weeks (chronic hives). Hives do not spread from person to person (noncontagious). They may get worse with scratching, exercise, and emotional stress. CAUSES   Allergic reaction to food, additives, or drugs.  Infections, including the common cold.  Illness, such as vasculitis, lupus, or thyroid disease.  Exposure to sunlight, heat, or cold.  Exercise.  Stress.  Contact with chemicals. SYMPTOMS   Red or white swollen patches on the skin. The patches may change size, shape, and location quickly and repeatedly.  Itching.  Swelling of the hands, feet, and face. This may occur if hives develop deeper in the skin. DIAGNOSIS  Your caregiver can usually tell what is wrong by performing a physical exam. Skin or blood tests may also be done to determine the cause of your hives. In some cases, the cause cannot be determined. TREATMENT  Mild cases usually get better with medicines such as antihistamines. Severe cases may require an emergency epinephrine injection. If the cause of your hives is known, treatment includes avoiding that trigger.  HOME CARE INSTRUCTIONS   Avoid causes that trigger your hives.  Take antihistamines as directed by your caregiver to reduce the severity of your hives. Non-sedating or low-sedating antihistamines are usually recommended. Do not drive while taking an antihistamine.  Take any other medicines prescribed for itching as directed by your caregiver.  Wear loose-fitting clothing.  Keep all follow-up appointments as directed  by your caregiver. SEEK MEDICAL CARE IF:   You have persistent or severe itching that is not relieved with medicine.  You have painful or swollen joints. SEEK IMMEDIATE MEDICAL CARE IF:   You have a fever.  Your tongue or lips are swollen.  You have trouble breathing or swallowing.  You feel tightness in the throat or chest.  You have abdominal pain. These problems may be the first sign of a life-threatening allergic reaction. Call your local emergency services (911 in [place] MAKE SURE YOU:   Understand these instructions.  Will watch your condition.  Will get help right away if you are not doing well or get worse.   This information is not intended to replace advice given to you by your health care provider. Make sure you discuss any questions you have with your health care provider.   Document Released: 11/02/2005 Document Revised: 11/07/2013 Document Reviewed: 01/26/2012 Elsevier Interactive Patient Education [employer].

## 2015-10-21 NOTE — Progress Notes (Signed)
Pre visit review using our clinic review tool, if applicable. No additional management support is needed unless otherwise documented below in the visit note.    Chief Complaint  Patient presents with  . Rash    HPI:  Patient Shelly [name]  comes in today for SDA for  problem evaluation. Has had itchy rash  For  1.5 weeks at least Saw dr Elease Hashimoto last week and given topical meds  Since then worse and moving all over  No edema cough NVD but itchy heals  All over  Mostly trunk No feer new meds   Taking Claritin ? If helps at all.  Had bronchitis in October and rx with antibiotic about a month ago ? z pac  nothing new since then no fever st uti sx   ROS: See pertinent positives and negatives per HPI. No hx of chronic hives    But tents to have allergy   Past Medical History  Diagnosis Date  . Diverticulitis   . Hyperlipidemia   . Hx of adenomatous colonic polyps   . IBS (irritable bowel syndrome)   . SVD (spontaneous vaginal delivery)     x 1  . Missed abortion     x 1 - resolved - no surgery  . GERD (gastroesophageal reflux disease)     occasional - diet controlled  . Arthritis     wrist, shoulder   . Vitamin D deficiency   . Osteoporosis   . Endometrial polyp     Family History  Problem Relation Age of Onset  . Hyperlipidemia Mother   . Hypertension Mother   . Diabetes Mother     type ll  . Leukemia Mother   . Lung cancer Father   . Hyperlipidemia Father   . Uterine cancer Other     grandmother  . Heart disease Other   . Hypertension Other   . Depression Brother   . Breast cancer Maternal Aunt   . Uterine cancer Paternal Grandmother   . Breast cancer Cousin 30    Social History   Social History  . Marital Status: Married    Spouse Name: N/A  . Number of Children: N/A  . Years of Education: N/A   Social History Main Topics  . Smoking status: Former Smoker -- 0.10 packs/day for 20 years    Types: Cigarettes    Quit date: 05/15/2007  . Smokeless  tobacco: Never Used  . Alcohol Use: 8.4 oz/week    14 Glasses of wine per week     Comment: 2 glasses wine every evening  . Drug Use: No  . Sexual Activity: Yes    Birth Control/ Protection: Post-menopausal   Other Topics Concern  . None   Social History Narrative    Outpatient Prescriptions Prior to Visit  Medication Sig Dispense Refill  . acetaminophen (TYLENOL) 500 MG tablet Take 500 mg by mouth every 6 (six) hours as needed for headache.    . clidinium-chlordiazePOXIDE (LIBRAX) 5-2.5 MG per capsule Take 1 capsule by mouth daily as needed (IBS).    Marland Kitchen triamcinolone cream (KENALOG) 0.1 % Apply 1 application topically 2 (two) times daily as needed. 30 g 2  . Fluocinolone Acetonide 0.01 % SHAM Apply 1 application topically daily as needed. (Patient not taking: Reported on 10/21/2015) 120 mL 0   No facility-administered medications prior to visit.     EXAM:  BP 154/76 mmHg  Temp(Src) 98.4 F (36.9 C) (Oral)  Ht 5\' 5"  (1.651 m)  Wt 177 lb 9.6 oz (80.559 kg)  BMI 29.55 kg/m2  Body mass index is 29.55 kg/(m^2).  GENERAL: vitals reviewed and listed above, alert, oriented, appears well hydrated and in no acute distress hives hweals  On neck  abd trunk expensive   No edema  HEENT: atraumatic, conjunctiva  clear, no obvious abnormalities on inspection of external nose and ears tm clear OP : no lesion edema or exudate  NECK: no obvious masses on inspection palpation  LUNGS: clear to auscultation bilaterally, no wheezes, rales or rhonchi, good air movement CV: HRRR, no clubbing cyanosis or  peripheral edema nl cap refill  MS: moves all extremities without noticeable focal  abnormality PSYCH: pleasant and cooperative, no obvious depression or anxiety  ASSESSMENT AND PLAN:  Discussed the following assessment and plan:  Urticaria - definite urticaria no edema alamr features ? triggered prev resp inf late for antibiotic rx and fu as needed  On going   Change to zyrtec   Add pred   Taper with cautions   Pt hesitant to take cause of poss side effects explained  Lukewarm showers  Fu dr B if note better after erx   Risk benefit of medication discussed.  -Patient advised to return or notify health care team  if symptoms worsen ,persist or new concerns arise.  Patient Instructions  Continue antihistamine  Zyrtec  every day .   Generic plain  Can add on  Benadryl for break throught  We can add prednisone   For allergic reaction.  To help it subside .   If not going away with antihistamines  Fu with Dr B if  persistent or progressive    Hives Hives are itchy, red, swollen areas of the skin. They can vary in size and location on your body. Hives can come and go for hours or several days (acute hives) or for several weeks (chronic hives). Hives do not spread from person to person (noncontagious). They may get worse with scratching, exercise, and emotional stress. CAUSES   Allergic reaction to food, additives, or drugs.  Infections, including the common cold.  Illness, such as vasculitis, lupus, or thyroid disease.  Exposure to sunlight, heat, or cold.  Exercise.  Stress.  Contact with chemicals. SYMPTOMS   Red or white swollen patches on the skin. The patches may change size, shape, and location quickly and repeatedly.  Itching.  Swelling of the hands, feet, and face. This may occur if hives develop deeper in the skin. DIAGNOSIS  Your caregiver can usually tell what is wrong by performing a physical exam. Skin or blood tests may also be done to determine the cause of your hives. In some cases, the cause cannot be determined. TREATMENT  Mild cases usually get better with medicines such as antihistamines. Severe cases may require an emergency epinephrine injection. If the cause of your hives is known, treatment includes avoiding that trigger.  HOME CARE INSTRUCTIONS   Avoid causes that trigger your hives.  Take antihistamines as directed by your caregiver to  reduce the severity of your hives. Non-sedating or low-sedating antihistamines are usually recommended. Do not drive while taking an antihistamine.  Take any other medicines prescribed for itching as directed by your caregiver.  Wear loose-fitting clothing.  Keep all follow-up appointments as directed by your caregiver. SEEK MEDICAL CARE IF:   You have persistent or severe itching that is not relieved with medicine.  You have painful or swollen joints. SEEK IMMEDIATE MEDICAL CARE IF:   You  have a fever.  Your tongue or lips are swollen.  You have trouble breathing or swallowing.  You feel tightness in the throat or chest.  You have abdominal pain. These problems may be the first sign of a life-threatening allergic reaction. Call your local emergency services (911 in [place] MAKE SURE YOU:   Understand these instructions.  Will watch your condition.  Will get help right away if you are not doing well or get worse.   This information is not intended to replace advice given to you by your health care provider. Make sure you discuss any questions you have with your health care provider.   Document Released: 11/02/2005 Document Revised: 11/07/2013 Document Reviewed: 01/26/2012 Elsevier Interactive Patient Education 2016 [place] K. Shante Archambeault M.D.

## 2015-10-28 ENCOUNTER — Encounter: Payer: Self-pay | Admitting: Internal Medicine

## 2015-10-28 ENCOUNTER — Ambulatory Visit (INDEPENDENT_AMBULATORY_CARE_PROVIDER_SITE_OTHER): Payer: Medicare Other | Admitting: Internal Medicine

## 2015-10-28 VITALS — BP 158/70 | Temp 98.3°F | Ht 65.0 in | Wt 173.5 lb

## 2015-10-28 DIAGNOSIS — R300 Dysuria: Secondary | ICD-10-CM

## 2015-10-28 DIAGNOSIS — R12 Heartburn: Secondary | ICD-10-CM | POA: Diagnosis not present

## 2015-10-28 DIAGNOSIS — L509 Urticaria, unspecified: Secondary | ICD-10-CM

## 2015-10-28 LAB — POCT URINALYSIS DIPSTICK
Bilirubin, UA: NEGATIVE
Blood, UA: NEGATIVE
Glucose, UA: NEGATIVE
Ketones, UA: NEGATIVE
Leukocytes, UA: NEGATIVE
Nitrite, UA: NEGATIVE
Protein, UA: NEGATIVE
Spec Grav, UA: >=1.03
Urobilinogen, UA: 0.2
pH, UA: 6

## 2015-10-28 NOTE — Patient Instructions (Signed)
Will arrange  Referral to allergist about the hives.   Will do culture test of urine to make sure not an early bacterial UTI .

## 2015-10-28 NOTE — Progress Notes (Signed)
Pre visit review using our clinic review tool, if applicable. No additional management support is needed unless otherwise documented below in the visit note.  Chief Complaint  Patient presents with  . Urticaria  . Dysuria    HPI: Patient Shelly [name]  comes in today for SDA for   problem evaluation.  3rd visit regarding tiching and hives  On pred with few days left seems better in am and worse as day progresses but less than before  Today had dysuria and wanted to make sure no uti  Had heart burn last  night but no sob  Vomiting dysphagia   ROS: See pertinent positives and negatives per HPI. No fever   Past Medical History  Diagnosis Date  . Diverticulitis   . Hyperlipidemia   . Hx of adenomatous colonic polyps   . IBS (irritable bowel syndrome)   . SVD (spontaneous vaginal delivery)     x 1  . Missed abortion     x 1 - resolved - no surgery  . GERD (gastroesophageal reflux disease)     occasional - diet controlled  . Arthritis     wrist, shoulder   . Vitamin D deficiency   . Osteoporosis   . Endometrial polyp     Family History  Problem Relation Age of Onset  . Hyperlipidemia Mother   . Hypertension Mother   . Diabetes Mother     type ll  . Leukemia Mother   . Lung cancer Father   . Hyperlipidemia Father   . Uterine cancer Other     grandmother  . Heart disease Other   . Hypertension Other   . Depression Brother   . Breast cancer Maternal Aunt   . Uterine cancer Paternal Grandmother   . Breast cancer Cousin 30    Social History   Social History  . Marital Status: Married    Spouse Name: N/A  . Number of Children: N/A  . Years of Education: N/A   Social History Main Topics  . Smoking status: Former Smoker -- 0.10 packs/day for 20 years    Types: Cigarettes    Quit date: 05/15/2007  . Smokeless tobacco: Never Used  . Alcohol Use: 8.4 oz/week    14 Glasses of wine per week     Comment: 2 glasses wine every evening  . Drug Use: No  . Sexual  Activity: Yes    Birth Control/ Protection: Post-menopausal   Other Topics Concern  . None   Social History Narrative    Outpatient Prescriptions Prior to Visit  Medication Sig Dispense Refill  . acetaminophen (TYLENOL) 500 MG tablet Take 500 mg by mouth every 6 (six) hours as needed for headache.    . clidinium-chlordiazePOXIDE (LIBRAX) 5-2.5 MG per capsule Take 1 capsule by mouth daily as needed (IBS).    . Lansoprazole (PREVACID PO) Take by mouth.    . loratadine (CLARITIN) 10 MG tablet Take 10 mg by mouth daily.    . predniSONE (DELTASONE) 10 MG tablet Take pills per day,6,6,6,4,4,4,2,2,2,1,1,1 for hives 40 tablet 0  . triamcinolone cream (KENALOG) 0.1 % Apply 1 application topically 2 (two) times daily as needed. 30 g 2  . Fluocinolone Acetonide 0.01 % SHAM Apply 1 application topically daily as needed. (Patient not taking: Reported on 10/21/2015) 120 mL 0   No facility-administered medications prior to visit.     EXAM:  BP 158/70 mmHg  Temp(Src) 98.3 F (36.8 C) (Oral)  Ht 5\' 5"  (1.651 m)  Wt 173 lb 8 oz (78.699 kg)  BMI 28.87 kg/m2  Body mass index is 28.87 kg/(m^2).  GENERAL: vitals reviewed and listed above, alert, oriented, appears well hydrated and in no acute distress HEENT: atraumatic, conjunctiva  clear, no obvious abnormalities on inspection of external nose and ears NECK: no obvious masses on inspection palpation  Skin  Fain hives on arms trunk is better today  No angioedema  CV: HRRR, no clubbing cyanosis or  peripheral edema nl cap refill  MS: moves all extremities without noticeable focal  abnormality PSYCH: pleasant and cooperative, no obvious depression or anxiety UA screen ds neg  ASSESSMENT AND PLAN:  Discussed the following assessment and plan:  Urticaria - improving but stil some break through and is onpred  taper refer to allergy  make dr B aware  - Plan: Ambulatory referral to Allergy  Dysuria new sx  - u cx pending    doubt . uti but  with sx  check cx  - Plan: POC Urinalysis Dipstick, Culture, Urine  Heart burn - possfrom the   prednisone last night not current or assoc sx   -Patient advised to return or notify health care team  if symptoms worsen ,persist or new concerns arise.  Patient Instructions  Will arrange  Referral to allergist about the hives.   Will do culture test of urine to make sure not an early bacterial UTI .      Standley Brooking. Panosh M.D.

## 2015-10-30 LAB — URINE CULTURE
Colony Count: NO GROWTH
Organism ID, Bacteria: NO GROWTH

## 2015-11-26 ENCOUNTER — Ambulatory Visit: Payer: Medicare Other | Admitting: Allergy and Immunology

## 2015-12-24 ENCOUNTER — Ambulatory Visit (INDEPENDENT_AMBULATORY_CARE_PROVIDER_SITE_OTHER): Payer: Medicare Other | Admitting: Allergy and Immunology

## 2015-12-24 ENCOUNTER — Encounter: Payer: Self-pay | Admitting: Allergy and Immunology

## 2015-12-24 VITALS — BP 134/68 | HR 76 | Temp 97.6°F | Resp 20 | Ht 64.37 in | Wt 175.7 lb

## 2015-12-24 DIAGNOSIS — K219 Gastro-esophageal reflux disease without esophagitis: Secondary | ICD-10-CM

## 2015-12-24 DIAGNOSIS — G4730 Sleep apnea, unspecified: Secondary | ICD-10-CM | POA: Diagnosis not present

## 2015-12-24 DIAGNOSIS — L509 Urticaria, unspecified: Secondary | ICD-10-CM | POA: Diagnosis not present

## 2015-12-24 DIAGNOSIS — J309 Allergic rhinitis, unspecified: Secondary | ICD-10-CM

## 2015-12-24 DIAGNOSIS — H1010 Acute atopic conjunctivitis, unspecified eye: Secondary | ICD-10-CM | POA: Diagnosis not present

## 2015-12-24 MED ORDER — RANITIDINE HCL 300 MG PO TABS
300.0000 mg | ORAL_TABLET | Freq: Every day | ORAL | Status: DC
Start: 2015-12-24 — End: 2016-01-27

## 2015-12-24 NOTE — Patient Instructions (Signed)
  1. Continue Claritin 10 mg one tablet one time per day  2. Start OTC Rhinocort one spray each nostril one time per day  3. Treat reflux with Zantac 300 mg at bedtime  4. Obtain a sleep study  5. Further evaluation?

## 2015-12-24 NOTE — Progress Notes (Signed)
Dear Dr. Regis Bill,  Thank you for referring Shelly [name] to the [facility] on 12/24/2015.   Below is a summation of this patient's evaluation and recommendations.  Thank you for your referral. I will keep you informed about this patient's response to treatment.   If you have any questions please to do hestitate to contact me.   Sincerely,  Jiles Prows, MD [facility]   ______________________________________________________________________    NEW PATIENT NOTE  Referring Provider: Burnis Medin, MD Primary Provider: Eulas Post, MD Date of office visit: 12/24/2015    Subjective:   Chief Complaint:  Shelly [name] (DOB: [birth year]) is a 70 y.o. female with a chief complaint of Urticaria  who presents to the clinic on 12/24/2015 with the following problems:  HPI Comments: Shelly [name] presents this clinic in evaluation of hives. In the fall of 2016 she developed a red raised itchy lesions across her body as well as very itchy hands and feet without any other associated systemic or constitutional symptoms. Her lesions never healed with scar hyperpigmentation but they may have lasted several days per event. She was sexually treated with prednisone and Claritin and over-the-counter or so of the past month or so she has had very good control of her urticaria while continuing on her Claritin. She stopped her Claritin 3 days ago and has not had return of her hives. She might have some very small faint red spots on her neck but otherwise has no other difficulty. There was never an obvious provoking factor giving rise to this issue.  Shelly [name] has a long history of allergic rhinoconjunctivitis that she treats with Claritin. She still has some nasal congestion and sneezing but does respond pretty well to Claritin. However, she has had a burning wood smell in her house over the course of the  past year that no one else can smell. She thinks that this is secondary to a certain type of furniture that was located into the house in the winter of 2016 and since she has had this removed from the house she may be better regarding her burning smell but yet still continues to have intermittent episodes of this burning smell.  Shelly [name] is tired all the time. She has very bad sleep at night and is up and down all night. She snores with awakening. She feels unrefreshed in the morning. She takes a nap at home and sometimes in her car. She does have reflux at nighttime when she uses Zantac on most nights.   Past Medical History  Diagnosis Date  . Diverticulitis   . Hyperlipidemia   . Hx of adenomatous colonic polyps   . IBS (irritable bowel syndrome)   . SVD (spontaneous vaginal delivery)     x 1  . Missed abortion     x 1 - resolved - no surgery  . GERD (gastroesophageal reflux disease)     occasional - diet controlled  . Arthritis     wrist, shoulder   . Vitamin D deficiency   . Osteoporosis   . Endometrial polyp     Past Surgical History  Procedure Laterality Date  . Tonsillectomy  1952  . Wisdom tooth extraction    . Dilation and curettage of uterus    . Diagnostic laparoscopy      cysts  . Colonoscopy    . Dilatation & curettage/hysteroscopy with trueclear N/A 10/31/2013  Procedure: DILATATION & CURETTAGE/HYSTEROSCOPY WITH TRUCLEAR;  Surgeon: Marylynn Pearson, MD;  Location: [facility];  Service: Gynecology;  Laterality: N/A;    Current Outpatient Prescriptions on File Prior to Visit  Medication Sig Dispense Refill  . acetaminophen (TYLENOL) 500 MG tablet Take 500 mg by mouth every 6 (six) hours as needed for headache.    . clidinium-chlordiazePOXIDE (LIBRAX) 5-2.5 MG per capsule Take 1 capsule by mouth daily as needed (IBS).    Marland Kitchen loratadine (CLARITIN) 10 MG tablet Take 10 mg by mouth daily.    Marland Kitchen triamcinolone cream (KENALOG) 0.1 % Apply 1 application topically 2 (two) times  daily as needed. 30 g 2  . Fluocinolone Acetonide 0.01 % SHAM Apply 1 application topically daily as needed. (Patient not taking: Reported on 10/21/2015) 120 mL 0  . Lansoprazole (PREVACID PO) Take by mouth. Reported on 12/24/2015    . [DISCONTINUED] omeprazole (PRILOSEC) 40 MG capsule Take 40 mg by mouth daily.      . [DISCONTINUED] pravastatin (PRAVACHOL) 40 MG tablet Take 1 tablet (40 mg total) by mouth every evening. 30 tablet 3   No current facility-administered medications on file prior to visit.    Allergies  Allergen Reactions  . Crestor [Rosuvastatin Calcium]     GI upset  . Lipitor [Atorvastatin Calcium] Itching  . Pravastatin     GI upset  . Sulfonamide Derivatives     Hives, itiching    Review of systems negative except as noted in HPI / PMHx or noted below:  Review of Systems  Constitutional: Negative.   HENT: Negative.   Eyes: Negative.   Respiratory: Negative.   Cardiovascular: Negative.   Gastrointestinal: Negative.   Genitourinary: Negative.   Musculoskeletal: Negative.   Skin: Negative.   Neurological: Negative.   Endo/Heme/Allergies: Negative.   Psychiatric/Behavioral: Negative.     Family History  Problem Relation Age of Onset  . Hyperlipidemia Mother   . Hypertension Mother   . Diabetes Mother     type ll  . Leukemia Mother   . Lung cancer Father   . Hyperlipidemia Father   . Uterine cancer Other     grandmother  . Heart disease Other   . Hypertension Other   . Depression Brother   . Breast cancer Maternal Aunt   . Uterine cancer Paternal Grandmother   . Breast cancer Cousin 30    Social History   Social History  . Marital Status: Married    Spouse Name: N/A  . Number of Children: N/A  . Years of Education: N/A   Occupational History  . Not on file.   Social History Main Topics  . Smoking status: Former Smoker -- 0.10 packs/day for 20 years    Types: Cigarettes    Quit date: 05/15/2007  . Smokeless tobacco: Never Used  .  Alcohol Use: 8.4 oz/week    14 Glasses of wine per week     Comment: 2 glasses wine every evening  . Drug Use: No  . Sexual Activity: Yes    Birth Control/ Protection: Post-menopausal   Other Topics Concern  . Not on file   Social History Narrative    Environmental and Social history  Lives in a house with a dry environment, no animals located inside the household, no plastic on the bed or pillow, carpeting in the bedroom, and no smokers located inside the household   Objective:  There were no vitals filed for this visit.      Physical Exam  Constitutional:  She is well-developed, well-nourished, and in no distress.  HENT:  Head: Normocephalic. Head is without right periorbital erythema and without left periorbital erythema.  Right Ear: Tympanic membrane, external ear and ear canal normal.  Left Ear: Tympanic membrane, external ear and ear canal normal.  Nose: Mucosal edema present. No rhinorrhea.  Mouth/Throat: Oropharynx is clear and moist and mucous membranes are normal. No oropharyngeal exudate.  Eyes: Conjunctivae and lids are normal. Pupils are equal, round, and reactive to light.  Neck: Trachea normal. No tracheal deviation present. No thyromegaly present.  Cardiovascular: Normal rate, regular rhythm, S1 normal, S2 normal and normal heart sounds.   No murmur heard. Pulmonary/Chest: Effort normal. No stridor. No tachypnea. No respiratory distress. She has no wheezes. She has no rales. She exhibits no tenderness.  Abdominal: Soft. She exhibits no distension and no mass. There is no hepatosplenomegaly. There is no tenderness. There is no rebound and no guarding.  Musculoskeletal: She exhibits no edema or tenderness.  Lymphadenopathy:       Head (right side): No tonsillar adenopathy present.       Head (left side): No tonsillar adenopathy present.    She has no cervical adenopathy.    She has no axillary adenopathy.  Neurological: She is alert. Gait normal.  Skin: No rash  noted. She is not diaphoretic. No erythema. No pallor. Nails show no clubbing.  Psychiatric: Mood and affect normal.     Diagnostics: None   Assessment and Plan:    1. Urticaria   2. Allergic rhinoconjunctivitis   3. Sleep apnea   4. Gastroesophageal reflux disease, esophagitis presence not specified     1. Continue Claritin 10 mg one tablet one time per day  2. Start OTC Rhinocort one spray each nostril one time per day  3. Treat reflux with Zantac 300 mg at bedtime  4. Obtain a sleep study  5. Further evaluation?  Shelly [name] apparently had some form of immunologic hyperreactivity with unknown trigger but this appears to be improving with time and thus will hold off on any further evaluation and an attempt to identify the etiologic agent that was responsible for that activity and just assume that she will continued to burn out this issue as time moves forward. For now she can stay on Claritin. 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 As well, she appears to have a history very consistent with sleep apnea and I think were obligated to further explore this issue with a sleep study. We'll see how things go over the course of the next several weeks. When we review the sleep study together will query her about her other issues as well and she will contact me should she have significant problems in the face of this plan.   Jiles Prows, MD [facility]

## 2016-01-23 ENCOUNTER — Other Ambulatory Visit: Payer: Self-pay | Admitting: Family Medicine

## 2016-01-23 NOTE — Telephone Encounter (Signed)
Pt has been seen recently but CPE is not utd with recent labs.   Last filled 01/21/2015 #60, 1 rf Please advise on refill

## 2016-01-24 NOTE — Telephone Encounter (Signed)
Refill OK

## 2016-01-27 ENCOUNTER — Ambulatory Visit (INDEPENDENT_AMBULATORY_CARE_PROVIDER_SITE_OTHER): Payer: Medicare Other | Admitting: Family Medicine

## 2016-01-27 VITALS — BP 130/80 | HR 91 | Temp 98.2°F | Ht 64.0 in | Wt 173.3 lb

## 2016-01-27 DIAGNOSIS — L03011 Cellulitis of right finger: Secondary | ICD-10-CM

## 2016-01-27 DIAGNOSIS — J209 Acute bronchitis, unspecified: Secondary | ICD-10-CM

## 2016-01-27 MED ORDER — DOXYCYCLINE HYCLATE 100 MG PO CAPS: 100.0000 mg | ORAL_CAPSULE | Freq: Two times a day (BID) | ORAL | Status: DC

## 2016-01-27 NOTE — Progress Notes (Signed)
Subjective:    Patient ID: Shelly [name], female    DOB: 1946/08/01, 70 y.o.   MRN: [id number]  HPI  Patient seen with the following 2 new acute problems  First is five-day history of URI symptoms. She developed what she describes as a "cold ". She developed nasal congestion and cough. Her cough is mostly nonproductive.  Intermittent chills but no fever.  No dyspnea. No wheezing. Cough is mild to moderate.  No alleviating or exacerbating factors. Nonsmoker.  Denies any associated sore throat, nausea, vomiting, or diarrhea.   Right thumb pain.  Noticed some redness a few days ago.  This morning she noticed a pocket of pus under the skin.  No fever. No known history of MRSA.  No recent reported injury. Her skin is generally very dry and cracked.  Past Medical History  Diagnosis Date  . Diverticulitis   . Hyperlipidemia   . Hx of adenomatous colonic polyps   . IBS (irritable bowel syndrome)   . SVD (spontaneous vaginal delivery)     x 1  . Missed abortion     x 1 - resolved - no surgery  . GERD (gastroesophageal reflux disease)     occasional - diet controlled  . Arthritis     wrist, shoulder   . Vitamin D deficiency   . Osteoporosis   . Endometrial polyp    Past Surgical History  Procedure Laterality Date  . Tonsillectomy  1952  . Wisdom tooth extraction    . Dilation and curettage of uterus    . Diagnostic laparoscopy      cysts  . Colonoscopy    . Dilatation & curettage/hysteroscopy with trueclear N/A 10/31/2013    Procedure: DILATATION & CURETTAGE/HYSTEROSCOPY WITH TRUCLEAR;  Surgeon: Marylynn Pearson, MD;  Location: [facility];  Service: Gynecology;  Laterality: N/A;    reports that she quit smoking about 8 years ago. Her smoking use included Cigarettes. She has a 2 pack-year smoking history. She has never used smokeless tobacco. She reports that she drinks about 8.4 oz of alcohol per week. She reports that she does not use illicit drugs. family history includes  Breast cancer in her maternal aunt; Breast cancer (age of onset: 58) in her cousin; Depression in her brother; Diabetes in her mother; Heart disease in her other; Hyperlipidemia in her father and mother; Hypertension in her mother and other; Leukemia in her mother; Lung cancer in her father; Uterine cancer in her other and paternal grandmother. Allergies  Allergen Reactions  . Crestor [Rosuvastatin Calcium]     GI upset  . Lipitor [Atorvastatin Calcium] Itching  . Pravastatin     GI upset  . Sulfonamide Derivatives     Hives, itiching      Review of Systems  Constitutional: Negative for fever and chills.  HENT: Positive for congestion and sinus pressure. Negative for ear discharge and ear pain.   Respiratory: Positive for cough. Negative for shortness of breath and wheezing.   Neurological: Negative for headaches.  Hematological: Negative for adenopathy.       Objective:   Physical Exam  Constitutional: She appears well-developed and well-nourished.  HENT:  Right Ear: External ear normal.  Left Ear: External ear normal.  Mouth/Throat: Oropharynx is clear and moist.  Neck: Neck supple.  Cardiovascular: Normal rate and regular rhythm.   Pulmonary/Chest: Effort normal and breath sounds normal. No respiratory distress. She has no wheezes. She has no rales.  Lymphadenopathy:    She has no cervical  adenopathy.  Skin:  Right thumb reveals some erythema along the lateral border with evidence for purulence consistent with paronychia.          Assessment & Plan:   #1 acute bronchitis. Suspect viral. Continue over-the-counter medications as needed. Follow-up promptly for any fever or shortness of breath   #2 paronychia right thumb with surrounding cellulitis changes.  We discussed risk and benefits of digital block with plain Xylocaine followed by incision and drainage and patient consents. Thumb prepped with Betadine. Digital block of right thumb performed with 1% plain Xylocaine  without epinephrine. Using #11 blade made a small linear incision over area of purulence.   we were able to fully drain significant amount of purulence. We used curved hemostats to explore wound cavity. No further purulence expressed. Topical antibiotic and dressing applied. Start warm saltwater soaks twice daily starting tonight. Doxycycline 100 mg twice daily for  7 days. Her tetanus is up-to-date

## 2016-01-27 NOTE — Patient Instructions (Addendum)
Paronychia °Paronychia is an infection of the skin that surrounds a nail. It usually affects the skin around a fingernail, but it may also occur near a toenail. It often causes pain and swelling around the nail. This condition may come on suddenly or develop over a longer period. In some cases, a collection of pus (abscess) can form near or under the nail. Usually, paronychia is not serious and it clears up with treatment. °CAUSES °This condition may be caused by bacteria or fungi. It is commonly caused by either Streptococcus or Staphylococcus bacteria. The bacteria or fungi often cause the infection by getting into the affected area through an opening in the skin, such as a cut or a hangnail. °RISK FACTORS °This condition is more likely to develop in: °· People who get their hands wet often, such as those who work as dishwashers, bartenders, or nurses. °· People who bite their fingernails or suck their thumbs. °· People who trim their nails too short. °· People who have hangnails or injured fingertips. °· People who get manicures. °· People who have diabetes. °SYMPTOMS °Symptoms of this condition include: °· Redness and swelling of the skin near the nail. °· Tenderness around the nail when you touch the area. °· Pus-filled bumps under the cuticle. The cuticle is the skin at the base or sides of the nail. °· Fluid or pus under the nail. °· Throbbing pain in the area. °DIAGNOSIS °This condition is usually diagnosed with a physical exam. In some cases, a sample of pus may be taken from an abscess to be tested in a lab. This can help to determine what type of bacteria or fungi is causing the condition. °TREATMENT °Treatment for this condition depends on the cause and severity of the condition. If the condition is mild, it may clear up on its own in a few days. Your health care provider may recommend soaking the affected area in warm water a few times a day. When treatment is needed, the options may  include: °· Antibiotic medicine, if the condition is caused by a bacterial infection. °· Antifungal medicine, if the condition is caused by a fungal infection. °· Incision and drainage, if an abscess is present. In this procedure, the health care provider will cut open the abscess so the pus can drain out. °HOME CARE INSTRUCTIONS °· Soak the affected area in warm water if directed to do so by your health care provider. You may be told to do this for 20 minutes, 2-3 times a day. Keep the area dry in between soakings. °· Take medicines only as directed by your health care provider. °· If you were prescribed an antibiotic medicine, finish all of it even if you start to feel better. °· Keep the affected area clean. °· Do not try to drain a fluid-filled bump yourself. °· If you will be washing dishes or performing other tasks that require your hands to get wet, wear rubber gloves. You should also wear gloves if your hands might come in contact with irritating substances, such as cleaners or chemicals. °· Follow your health care provider's instructions about: °¨ Wound care. °¨ Bandage (dressing) changes and removal. °SEEK MEDICAL CARE IF: °· Your symptoms get worse or do not improve with treatment. °· You have a fever or chills. °· You have redness spreading from the affected area. °· You have continued or increased fluid, blood, or pus coming from the affected area. °· Your finger or knuckle becomes swollen or is difficult to move. °  °  This information is not intended to replace advice given to you by your health care provider. Make sure you discuss any questions you have with your health care provider. °  °Document Released: 04/28/2001 Document Revised: 03/19/2015 Document Reviewed: 10/10/2014 °Elsevier Interactive Patient Education ©2016 Elsevier Inc. ° °

## 2016-01-27 NOTE — Progress Notes (Signed)
Pre visit review using our clinic review tool, if applicable. No additional management support is needed unless otherwise documented below in the visit note. 

## 2016-02-10 DIAGNOSIS — D225 Melanocytic nevi of trunk: Secondary | ICD-10-CM | POA: Diagnosis not present

## 2016-02-10 DIAGNOSIS — B079 Viral wart, unspecified: Secondary | ICD-10-CM | POA: Diagnosis not present

## 2016-02-26 DIAGNOSIS — I83813 Varicose veins of bilateral lower extremities with pain: Secondary | ICD-10-CM | POA: Diagnosis not present

## 2016-02-26 DIAGNOSIS — I83893 Varicose veins of bilateral lower extremities with other complications: Secondary | ICD-10-CM | POA: Diagnosis not present

## 2016-03-03 DIAGNOSIS — M79651 Pain in right thigh: Secondary | ICD-10-CM | POA: Diagnosis not present

## 2016-03-03 DIAGNOSIS — I83893 Varicose veins of bilateral lower extremities with other complications: Secondary | ICD-10-CM | POA: Diagnosis not present

## 2016-03-03 DIAGNOSIS — I8311 Varicose veins of right lower extremity with inflammation: Secondary | ICD-10-CM | POA: Diagnosis not present

## 2016-03-03 DIAGNOSIS — I83811 Varicose veins of right lower extremities with pain: Secondary | ICD-10-CM | POA: Diagnosis not present

## 2016-03-07 ENCOUNTER — Other Ambulatory Visit: Payer: Self-pay | Admitting: Family Medicine

## 2016-03-30 DIAGNOSIS — I8311 Varicose veins of right lower extremity with inflammation: Secondary | ICD-10-CM | POA: Diagnosis not present

## 2016-03-30 DIAGNOSIS — I83813 Varicose veins of bilateral lower extremities with pain: Secondary | ICD-10-CM | POA: Diagnosis not present

## 2016-03-30 DIAGNOSIS — I8312 Varicose veins of left lower extremity with inflammation: Secondary | ICD-10-CM | POA: Diagnosis not present

## 2016-04-01 ENCOUNTER — Ambulatory Visit: Payer: Medicare Other | Admitting: Allergy and Immunology

## 2016-04-06 ENCOUNTER — Other Ambulatory Visit: Payer: Self-pay

## 2016-04-06 DIAGNOSIS — Z1231 Encounter for screening mammogram for malignant neoplasm of breast: Secondary | ICD-10-CM

## 2016-05-12 ENCOUNTER — Ambulatory Visit
Admission: RE | Admit: 2016-05-12 | Discharge: 2016-05-12 | Disposition: A | Discharge status: Home / Self Care | Payer: Medicare Other | Source: Ambulatory Visit

## 2016-05-12 DIAGNOSIS — Z1231 Encounter for screening mammogram for malignant neoplasm of breast: Secondary | ICD-10-CM

## 2016-05-18 ENCOUNTER — Ambulatory Visit (INDEPENDENT_AMBULATORY_CARE_PROVIDER_SITE_OTHER): Payer: Medicare Other | Admitting: Family Medicine

## 2016-05-18 ENCOUNTER — Encounter: Payer: Self-pay | Admitting: Family Medicine

## 2016-05-18 ENCOUNTER — Other Ambulatory Visit: Payer: Self-pay | Admitting: Family Medicine

## 2016-05-18 VITALS — BP 138/82 | HR 75 | Temp 97.9°F | Resp 16 | Ht 64.25 in | Wt 173.0 lb

## 2016-05-18 DIAGNOSIS — R928 Other abnormal and inconclusive findings on diagnostic imaging of breast: Secondary | ICD-10-CM

## 2016-05-18 DIAGNOSIS — K219 Gastro-esophageal reflux disease without esophagitis: Secondary | ICD-10-CM

## 2016-05-18 DIAGNOSIS — Z0000 Encounter for general adult medical examination without abnormal findings: Secondary | ICD-10-CM | POA: Diagnosis not present

## 2016-05-18 DIAGNOSIS — M810 Age-related osteoporosis without current pathological fracture: Secondary | ICD-10-CM | POA: Diagnosis not present

## 2016-05-18 DIAGNOSIS — E785 Hyperlipidemia, unspecified: Secondary | ICD-10-CM

## 2016-05-18 DIAGNOSIS — R739 Hyperglycemia, unspecified: Secondary | ICD-10-CM

## 2016-05-18 LAB — BASIC METABOLIC PANEL
BUN: 15 mg/dL (ref 6–23)
CO2: 29 mEq/L (ref 19–32)
Calcium: 9.3 mg/dL (ref 8.4–10.5)
Chloride: 104 mEq/L (ref 96–112)
Creatinine, Ser: 0.86 mg/dL (ref 0.40–1.20)
GFR: 69.03 mL/min (ref >60.00)
Glucose, Bld: 114 mg/dL — ABNORMAL HIGH (ref 70–99)
Potassium: 3.9 mEq/L (ref 3.5–5.1)
Sodium: 137 mEq/L (ref 135–145)

## 2016-05-18 LAB — HEMOGLOBIN A1C: Hgb A1c MFr Bld: 6.7 % — ABNORMAL HIGH (ref 4.6–6.5)

## 2016-05-18 NOTE — Progress Notes (Signed)
Subjective:    Patient ID: Shelly [name], female    DOB: Nov 23, 1945, 70 y.o.   MRN: [id number]  HPI Patient seen for Medicare wellness exam and medical follow-up. She has history of recurrent urticaria and has been followed by allergy. History of GERD and takes Nexium as needed.  GERD symptoms stable. No recent dysphagia.  Osteoporosis by bone density scan last August. She had T score of -3.2 spine and -2.6 hip and she never followed up with her gynecologist to discuss. She does not take consistent calcium or vitamin D. She has had prior fracture. She is very concerned about taking oral bisphosphonates because of history of reflux  She has history of irritable bowel syndrome. Relatively stable recently. Hyperlipidemia. She tried multiple statins previously cannot tolerate any of them. No history of CAD or peripheral vascular disease.  Hx of prediabetes.  Not monitoring blood sugar.  No polyuria or polydipsia.  Past Medical History  Diagnosis Date  . Diverticulitis   . Hyperlipidemia   . Hx of adenomatous colonic polyps   . IBS (irritable bowel syndrome)   . SVD (spontaneous vaginal delivery)     x 1  . Missed abortion     x 1 - resolved - no surgery  . GERD (gastroesophageal reflux disease)     occasional - diet controlled  . Arthritis     wrist, shoulder   . Vitamin D deficiency   . Osteoporosis   . Endometrial polyp    Past Surgical History  Procedure Laterality Date  . Tonsillectomy  1952  . Wisdom tooth extraction    . Dilation and curettage of uterus    . Diagnostic laparoscopy      cysts  . Colonoscopy    . Dilatation & curettage/hysteroscopy with trueclear N/A 10/31/2013    Procedure: DILATATION & CURETTAGE/HYSTEROSCOPY WITH TRUCLEAR;  Surgeon: Marylynn Pearson, MD;  Location: [facility];  Service: Gynecology;  Laterality: N/A;    reports that she quit smoking about 9 years ago. Her smoking use included Cigarettes. She has a 2 pack-year smoking history. She has  never used smokeless tobacco. She reports that she drinks about 8.4 oz of alcohol per week. She reports that she does not use illicit drugs. family history includes Breast cancer in her maternal aunt; Breast cancer (age of onset: 1) in her cousin; Depression in her brother; Diabetes in her mother; Heart disease in her other; Hyperlipidemia in her father and mother; Hypertension in her mother and other; Leukemia in her mother; Lung cancer in her father; Uterine cancer in her other and paternal grandmother. Allergies  Allergen Reactions  . Crestor [Rosuvastatin Calcium]     GI upset  . Lipitor [Atorvastatin Calcium] Itching  . Pravastatin     GI upset  . Sulfonamide Derivatives     Hives, itiching   1.  Risk factors based on Past Medical , Social, and Family history reviewed and as indicated above with no changes 2.  Limitations in physical activities None.  No recent falls.  Does not get any consistent exercise.   3.  Depression/mood No active depression or anxiety issues 4.  Hearing No defiits 5.  ADLs independent in all. 6.  Cognitive function (orientation to time and place, language, writing, speech,memory) no short or long term memory issues.  Language and judgement intact. 7.  Home Safety no issues 8.  Height, weight, and visual acuity.all stable. 9.  Counseling discussed increased weight bearing exercise. 10. Recommendation of preventive services.  Yearly flu vaccine.  Immunizations are up to date. 11. Labs based on risk factors BMP and A1C. 12. Care Plan as above. 41. Other Providers Dr Olevia Perches -GI, Dr Mollie Germany 14. Written schedule of screening/prevention services given to patient.      Review of Systems  Constitutional: Negative for fever, activity change, appetite change, fatigue and unexpected weight change.  HENT: Negative for ear pain, hearing loss, sore throat and trouble swallowing.   Eyes: Negative for visual disturbance.  Respiratory: Negative for cough and  shortness of breath.   Cardiovascular: Negative for chest pain and palpitations.  Gastrointestinal: Negative for abdominal pain, diarrhea, constipation and blood in stool.  Genitourinary: Negative for dysuria and hematuria.  Musculoskeletal: Positive for arthralgias. Negative for myalgias and back pain.  Skin: Negative for rash.  Neurological: Negative for dizziness, syncope and headaches.  Hematological: Negative for adenopathy.  Psychiatric/Behavioral: Negative for confusion and dysphoric mood.       Objective:   Physical Exam  Constitutional: She is oriented to person, place, and time. She appears well-developed and well-nourished.  HENT:  Head: Normocephalic and atraumatic.  Eyes: EOM are normal. Pupils are equal, round, and reactive to light.  Neck: Normal range of motion. Neck supple. No thyromegaly present.  Cardiovascular: Normal rate, regular rhythm and normal heart sounds.   No murmur heard. Pulmonary/Chest: Breath sounds normal. No respiratory distress. She has no wheezes. She has no rales.  Abdominal: Soft. Bowel sounds are normal. She exhibits no distension and no mass. There is no tenderness. There is no rebound and no guarding.  Musculoskeletal: Normal range of motion. She exhibits no edema.  Lymphadenopathy:    She has no cervical adenopathy.  Neurological: She is alert and oriented to person, place, and time. She displays normal reflexes. No cranial nerve deficit.  Skin: No rash noted.  Psychiatric: She has a normal mood and affect. Her behavior is normal. Judgment and thought content normal.          Assessment & Plan:  #1 Medicare subsequent annual wellness visit. Immunizations up-to-date. Continue yearly flu vaccine. She continues to see gynecologist. Recent mammogram normal  #2 osteoporosis. Recommend daily calcium 1200 mg and vitamin D 2000 international units. She is currently untreated. Prior history of fracture. Concern for oral bisphosphonates with  history of reflux. Consider Prolia injection every 6 months if we can get this approved  #3 GERD. Still has intermittent symptoms. Dietary factors discussed. Continue low-dose Nexium as needed  #4 history of hyperglycemia. Blood sugar not checked in quite some time. Recheck fasting blood sugar today along with A1c  Eulas Post MD [facility]

## 2016-05-18 NOTE — Patient Instructions (Signed)
Osteoporosis Osteoporosis is the thinning and loss of density in the bones. Osteoporosis makes the bones more brittle, fragile, and likely to break (fracture). Over time, osteoporosis can cause the bones to become so weak that they fracture after a simple fall. The bones most likely to fracture are the bones in the hip, wrist, and spine. CAUSES  The exact cause is not known. RISK FACTORS Anyone can develop osteoporosis. You may be at greater risk if you have a family history of the condition or have poor nutrition. You may also have a higher risk if you are:   Female.   39 years old or older.  A smoker.  Not physically active.   White or Asian.  Slender. SIGNS AND SYMPTOMS  A fracture might be the first sign of the disease, especially if it results from a fall or injury that would not usually cause a bone to break. Other signs and symptoms include:   Low back and neck pain.  Stooped posture.  Height loss. DIAGNOSIS  To make a diagnosis, your health care provider may:  Take a medical history.  Perform a physical exam.  Order tests, such as:  A bone mineral density test.  A dual-energy X-ray absorptiometry test. TREATMENT  The goal of osteoporosis treatment is to strengthen your bones to reduce your risk of a fracture. Treatment may involve:  Making lifestyle changes, such as:  Eating a diet rich in calcium.  Doing weight-bearing and muscle-strengthening exercises.  Stopping tobacco use.  Limiting alcohol intake.  Taking medicine to slow the process of bone loss or to increase bone density.  Monitoring your levels of calcium and vitamin D. HOME CARE INSTRUCTIONS  Include calcium and vitamin D in your diet. Calcium is important for bone health, and vitamin D helps the body absorb calcium.  Perform weight-bearing and muscle-strengthening exercises as directed by your health care provider.  Do not use any tobacco products, including cigarettes, chewing  tobacco, and electronic cigarettes. If you need help quitting, ask your health care provider.  Limit your alcohol intake.  Take medicines only as directed by your health care provider.  Keep all follow-up visits as directed by your health care provider. This is important.  Take precautions at home to lower your risk of falling, such as:  Keeping rooms well lit and clutter free.  Installing safety rails on stairs.  Using rubber mats in the bathroom and other areas that are often wet or slippery. SEEK IMMEDIATE MEDICAL CARE IF:  You fall or injure yourself.    This information is not intended to replace advice given to you by your health care provider. Make sure you discuss any questions you have with your health care provider.   Document Released: 08/12/2005 Document Revised: 11/23/2014 Document Reviewed: 04/12/2014 Elsevier Interactive Patient Education 2016 [employer] [facility]  Topic Date Due  . Hepatitis C Screening  December 31, 1945  . INFLUENZA VACCINE  06/16/2016  . MAMMOGRAM  05/12/2018  . COLONOSCOPY  09/13/2019  . TETANUS/TDAP  05/14/2024  . DEXA SCAN  Completed  . ZOSTAVAX  Completed  . PNA vac Low Risk Adult  Completed   We will try to get Prolia approved for osteoporosis treatment.   Get back on daily calcium (1,200 mg) and Vit (2,000 IU)

## 2016-05-26 ENCOUNTER — Ambulatory Visit
Admission: RE | Admit: 2016-05-26 | Discharge: 2016-05-26 | Disposition: A | Discharge status: Home / Self Care | Payer: Medicare Other | Source: Ambulatory Visit | Attending: Family Medicine | Admitting: Family Medicine

## 2016-05-26 DIAGNOSIS — R928 Other abnormal and inconclusive findings on diagnostic imaging of breast: Secondary | ICD-10-CM

## 2016-05-26 DIAGNOSIS — R922 Inconclusive mammogram: Secondary | ICD-10-CM | POA: Diagnosis not present

## 2016-06-02 DIAGNOSIS — I83811 Varicose veins of right lower extremities with pain: Secondary | ICD-10-CM | POA: Diagnosis not present

## 2016-06-02 DIAGNOSIS — I8311 Varicose veins of right lower extremity with inflammation: Secondary | ICD-10-CM | POA: Diagnosis not present

## 2016-06-26 ENCOUNTER — Ambulatory Visit (INDEPENDENT_AMBULATORY_CARE_PROVIDER_SITE_OTHER): Payer: Medicare Other | Admitting: Family Medicine

## 2016-06-26 VITALS — BP 130/70 | HR 86 | Temp 98.1°F | Ht 64.25 in | Wt 174.0 lb

## 2016-06-26 DIAGNOSIS — M722 Plantar fascial fibromatosis: Secondary | ICD-10-CM

## 2016-06-26 NOTE — Patient Instructions (Signed)
Plantar Fasciitis Plantar fasciitis is a painful foot condition that affects the heel. It occurs when the band of tissue that connects the toes to the heel bone (plantar fascia) becomes irritated. This can happen after exercising too much or doing other repetitive activities (overuse injury). The pain from plantar fasciitis can range from mild irritation to severe pain that makes it difficult for you to walk or move. The pain is usually worse in the morning or after you have been sitting or lying down for a while. CAUSES This condition may be caused by:  Standing for long periods of time.  Wearing shoes that do not fit.  Doing high-impact activities, including running, aerobics, and ballet.  Being overweight.  Having an abnormal way of walking (gait).  Having tight calf muscles.  Having high arches in your feet.  Starting a new athletic activity. SYMPTOMS The main symptom of this condition is heel pain. Other symptoms include:  Pain that gets worse after activity or exercise.  Pain that is worse in the morning or after resting.  Pain that goes away after you walk for a few minutes. DIAGNOSIS This condition may be diagnosed based on your signs and symptoms. Your health care provider will also do a physical exam to check for:  A tender area on the bottom of your foot.  A high arch in your foot.  Pain when you move your foot.  Difficulty moving your foot. You may also need to have imaging studies to confirm the diagnosis. These can include:  X-rays.  Ultrasound.  MRI. TREATMENT  Treatment for plantar fasciitis depends on the severity of the condition. Your treatment may include:  Rest, ice, and over-the-counter pain medicines to manage your pain.  Exercises to stretch your calves and your plantar fascia.  A splint that holds your foot in a stretched, upward position while you sleep (night splint).  Physical therapy to relieve symptoms and prevent problems in the  future.  Cortisone injections to relieve severe pain.  Extracorporeal shock wave therapy (ESWT) to stimulate damaged plantar fascia with electrical impulses. It is often used as a last resort before surgery.  Surgery, if other treatments have not worked after 12 months. HOME CARE INSTRUCTIONS  Take medicines only as directed by your health care provider.  Avoid activities that cause pain.  Roll the bottom of your foot over a bag of ice or a bottle of cold water. Do this for 20 minutes, 3-4 times a day.  Perform simple stretches as directed by your health care provider.  Try wearing athletic shoes with air-sole or gel-sole cushions or soft shoe inserts.  Wear a night splint while sleeping, if directed by your health care provider.  Keep all follow-up appointments with your health care provider. PREVENTION   Do not perform exercises or activities that cause heel pain.  Consider finding low-impact activities if you continue to have problems.  Lose weight if you need to. The best way to prevent plantar fasciitis is to avoid the activities that aggravate your plantar fascia. SEEK MEDICAL CARE IF:  Your symptoms do not go away after treatment with home care measures.  Your pain gets worse.  Your pain affects your ability to move or do your daily activities.   This information is not intended to replace advice given to you by your health care provider. Make sure you discuss any questions you have with your health care provider.   Document Released: 07/28/2001 Document Revised: 07/24/2015 Document Reviewed: 09/12/2014 Elsevier   Interactive Patient Education 2016 Elsevier Inc.  

## 2016-06-26 NOTE — Progress Notes (Signed)
Subjective:     Patient ID: Shelly [name], female   DOB: 1946/06/10, 70 y.o.   MRN: [id number]  HPI Right foot pain Location is proximal plantar fascia. Onset about 2 months ago. No injury. No change of shoe wear. She's tried some icing without much improvement. She plans to get arch supports. Pain especially bad first thing in the morning after prolonged periods of sitting. No Achilles pain. She has high arches and does frequently wear high heels  Past Medical History:  Diagnosis Date  . Arthritis    wrist, shoulder   . Diverticulitis   . Endometrial polyp   . GERD (gastroesophageal reflux disease)    occasional - diet controlled  . Hx of adenomatous colonic polyps   . Hyperlipidemia   . IBS (irritable bowel syndrome)   . Missed abortion    x 1 - resolved - no surgery  . Osteoporosis   . SVD (spontaneous vaginal delivery)    x 1  . Vitamin D deficiency    Past Surgical History:  Procedure Laterality Date  . COLONOSCOPY    . DIAGNOSTIC LAPAROSCOPY     cysts  . DILATATION & CURETTAGE/HYSTEROSCOPY WITH TRUECLEAR N/A 10/31/2013   Procedure: DILATATION & CURETTAGE/HYSTEROSCOPY WITH TRUCLEAR;  Surgeon: Marylynn Pearson, MD;  Location: [facility];  Service: Gynecology;  Laterality: N/A;  . DILATION AND CURETTAGE OF UTERUS    . TONSILLECTOMY  1952  . WISDOM TOOTH EXTRACTION      reports that she quit smoking about 9 years ago. Her smoking use included Cigarettes. She has a 2.00 pack-year smoking history. She has never used smokeless tobacco. She reports that she drinks about 8.4 oz of alcohol per week . She reports that she does not use drugs. family history includes Breast cancer in her maternal aunt; Breast cancer (age of onset: 45) in her cousin; Depression in her brother; Diabetes in her mother; Heart disease in her other; Hyperlipidemia in her father and mother; Hypertension in her mother and other; Leukemia in her mother; Lung cancer in her father; Uterine cancer in her other and  paternal grandmother. Allergies  Allergen Reactions  . Crestor [Rosuvastatin Calcium]     GI upset  . Lipitor [Atorvastatin Calcium] Itching  . Pravastatin     GI upset  . Sulfonamide Derivatives     Hives, itiching     Review of Systems  Neurological: Negative for weakness and numbness.       Objective:   Physical Exam  Constitutional: She appears well-developed and well-nourished.  Cardiovascular: Normal rate and regular rhythm.   Pulmonary/Chest: Effort normal and breath sounds normal. No respiratory distress. She has no wheezes. She has no rales.  Musculoskeletal:  Right foot reveals some tenderness over the proximal plantar fascia medially. No Achilles tenderness. No warmth. No erythema. No ecchymosis.       Assessment:     Right plantar fasciitis    Plan:     -Continue regular icing -Continue with frequent stretches -Get arch support for her shoes -Avoid high heels -Consider heel cup for additional cushioning -Consider steroid injection if not improving in 1-2 months  Eulas Post MD [facility]

## 2016-06-30 ENCOUNTER — Ambulatory Visit: Payer: Medicare Other | Admitting: Family Medicine

## 2016-07-21 DIAGNOSIS — M79671 Pain in right foot: Secondary | ICD-10-CM | POA: Diagnosis not present

## 2016-07-21 DIAGNOSIS — M722 Plantar fascial fibromatosis: Secondary | ICD-10-CM | POA: Diagnosis not present

## 2016-07-22 ENCOUNTER — Telehealth: Payer: Self-pay

## 2016-07-22 DIAGNOSIS — M722 Plantar fascial fibromatosis: Secondary | ICD-10-CM | POA: Diagnosis not present

## 2016-07-27 NOTE — Telephone Encounter (Signed)
error 

## 2016-07-29 DIAGNOSIS — M722 Plantar fascial fibromatosis: Secondary | ICD-10-CM | POA: Diagnosis not present

## 2016-07-30 ENCOUNTER — Telehealth: Payer: Self-pay

## 2016-07-30 NOTE — Telephone Encounter (Signed)
Called patient and left a voicemail message asking for a return phone call to get her scheduled for her Prolia injection.

## 2016-08-05 DIAGNOSIS — Z124 Encounter for screening for malignant neoplasm of cervix: Secondary | ICD-10-CM | POA: Diagnosis not present

## 2016-08-05 DIAGNOSIS — Z6829 Body mass index (BMI) 29.0-29.9, adult: Secondary | ICD-10-CM | POA: Diagnosis not present

## 2016-08-07 ENCOUNTER — Ambulatory Visit (INDEPENDENT_AMBULATORY_CARE_PROVIDER_SITE_OTHER): Payer: Medicare Other | Admitting: *Deleted

## 2016-08-07 DIAGNOSIS — Z23 Encounter for immunization: Secondary | ICD-10-CM

## 2016-08-07 NOTE — Telephone Encounter (Signed)
Patient has been scheduled for her Prolia shot.

## 2016-08-10 DIAGNOSIS — M722 Plantar fascial fibromatosis: Secondary | ICD-10-CM | POA: Diagnosis not present

## 2016-08-12 DIAGNOSIS — M722 Plantar fascial fibromatosis: Secondary | ICD-10-CM | POA: Diagnosis not present

## 2016-08-14 DIAGNOSIS — M722 Plantar fascial fibromatosis: Secondary | ICD-10-CM | POA: Diagnosis not present

## 2016-08-19 DIAGNOSIS — M722 Plantar fascial fibromatosis: Secondary | ICD-10-CM | POA: Diagnosis not present

## 2016-08-20 ENCOUNTER — Ambulatory Visit (INDEPENDENT_AMBULATORY_CARE_PROVIDER_SITE_OTHER): Payer: Medicare Other | Admitting: Family Medicine

## 2016-08-20 DIAGNOSIS — M818 Other osteoporosis without current pathological fracture: Secondary | ICD-10-CM

## 2016-08-20 MED ORDER — DENOSUMAB 60 MG/ML ~~LOC~~ SOLN
60.0000 mg | Freq: Once | SUBCUTANEOUS | Status: AC
  Administered 2016-08-20: 60 mg via SUBCUTANEOUS

## 2016-08-26 ENCOUNTER — Ambulatory Visit (INDEPENDENT_AMBULATORY_CARE_PROVIDER_SITE_OTHER): Payer: Medicare Other | Admitting: Family Medicine

## 2016-08-26 VITALS — BP 150/80 | HR 80 | Temp 97.8°F | Ht 64.25 in | Wt 171.4 lb

## 2016-08-26 DIAGNOSIS — M722 Plantar fascial fibromatosis: Secondary | ICD-10-CM | POA: Diagnosis not present

## 2016-08-26 NOTE — Progress Notes (Signed)
Subjective:     Patient ID: Shelly [name], female   DOB: [birth year], 70 y.o.   MRN: [id number]  HPI Patient seen with right foot pain. Plantar fasciitis over 4 months duration. She's tried icing, stretches, heel cup without relief. Also saw orthopedist and had what sounds like iontopheresis Without any improvement. She has pain when first standing up in the morning and after prolonged periods of sitting. No Achilles pain. She has also tried new shoes without improvement. No visible swelling. No history of injury. She has high arches. She has not tried any arch supports.  Past Medical History:  Diagnosis Date  . Arthritis    wrist, shoulder   . Diverticulitis   . Endometrial polyp   . GERD (gastroesophageal reflux disease)    occasional - diet controlled  . Hx of adenomatous colonic polyps   . Hyperlipidemia   . IBS (irritable bowel syndrome)   . Missed abortion    x 1 - resolved - no surgery  . Osteoporosis   . SVD (spontaneous vaginal delivery)    x 1  . Vitamin D deficiency    Past Surgical History:  Procedure Laterality Date  . COLONOSCOPY    . DIAGNOSTIC LAPAROSCOPY     cysts  . DILATATION & CURETTAGE/HYSTEROSCOPY WITH TRUECLEAR N/A 10/31/2013   Procedure: DILATATION & CURETTAGE/HYSTEROSCOPY WITH TRUCLEAR;  Surgeon: Marylynn Pearson, MD;  Location: [facility];  Service: Gynecology;  Laterality: N/A;  . DILATION AND CURETTAGE OF UTERUS    . TONSILLECTOMY  1952  . WISDOM TOOTH EXTRACTION      reports that she quit smoking about 9 years ago. Her smoking use included Cigarettes. She has a 2.00 pack-year smoking history. She has never used smokeless tobacco. She reports that she drinks about 8.4 oz of alcohol per week . She reports that she does not use drugs. family history includes Breast cancer in her maternal aunt; Breast cancer (age of onset: 49) in her cousin; Depression in her brother; Diabetes in her mother; Heart disease in her other; Hyperlipidemia in her father and  mother; Hypertension in her mother and other; Leukemia in her mother; Lung cancer in her father; Uterine cancer in her other and paternal grandmother. Allergies  Allergen Reactions  . Crestor [Rosuvastatin Calcium]     GI upset  . Lipitor [Atorvastatin Calcium] Itching  . Pravastatin     GI upset  . Sulfonamide Derivatives     Hives, itiching  '   Review of Systems  Neurological: Negative for weakness and numbness.       Objective:   Physical Exam  Constitutional: She appears well-developed and well-nourished.  Cardiovascular: Normal rate and regular rhythm.   Pulmonary/Chest: Effort normal and breath sounds normal. No respiratory distress. She has no wheezes. She has no rales.  Musculoskeletal:  Right foot reveals no visible swelling. She has high arches. She has some tenderness over the proximal right plantar fascia. No Achilles tenderness.       Assessment:     Right plantar fasciitis    Plan:     -Recommend arch supports -Continue stretches and sports medicine rehabilitation handout given for exercises -We discussed steroid injection but at this point she is not interested. She would like to give this another few weeks with the above.  Eulas Post MD [facility]

## 2016-08-26 NOTE — Progress Notes (Signed)
Pre visit review using our clinic review tool, if applicable. No additional management support is needed unless otherwise documented below in the visit note. 

## 2016-08-26 NOTE — Patient Instructions (Signed)

## 2016-10-01 DIAGNOSIS — M659 Synovitis and tenosynovitis, unspecified: Secondary | ICD-10-CM | POA: Diagnosis not present

## 2016-10-01 DIAGNOSIS — M79644 Pain in right finger(s): Secondary | ICD-10-CM | POA: Diagnosis not present

## 2016-10-01 DIAGNOSIS — M79641 Pain in right hand: Secondary | ICD-10-CM | POA: Diagnosis not present

## 2016-10-10 DIAGNOSIS — K219 Gastro-esophageal reflux disease without esophagitis: Secondary | ICD-10-CM | POA: Diagnosis not present

## 2016-10-10 DIAGNOSIS — R079 Chest pain, unspecified: Secondary | ICD-10-CM | POA: Diagnosis not present

## 2016-10-10 DIAGNOSIS — R05 Cough: Secondary | ICD-10-CM | POA: Diagnosis not present

## 2016-11-26 DIAGNOSIS — H524 Presbyopia: Secondary | ICD-10-CM | POA: Diagnosis not present

## 2016-11-26 DIAGNOSIS — H2513 Age-related nuclear cataract, bilateral: Secondary | ICD-10-CM | POA: Diagnosis not present

## 2016-11-26 DIAGNOSIS — H1045 Other chronic allergic conjunctivitis: Secondary | ICD-10-CM | POA: Diagnosis not present

## 2016-11-26 DIAGNOSIS — H52223 Regular astigmatism, bilateral: Secondary | ICD-10-CM | POA: Diagnosis not present

## 2016-11-26 DIAGNOSIS — H5203 Hypermetropia, bilateral: Secondary | ICD-10-CM | POA: Diagnosis not present

## 2016-12-15 DIAGNOSIS — M659 Synovitis and tenosynovitis, unspecified: Secondary | ICD-10-CM | POA: Diagnosis not present

## 2016-12-15 DIAGNOSIS — M79641 Pain in right hand: Secondary | ICD-10-CM | POA: Diagnosis not present

## 2017-01-08 ENCOUNTER — Other Ambulatory Visit: Payer: Self-pay | Admitting: Family Medicine

## 2017-01-12 ENCOUNTER — Telehealth: Payer: Self-pay | Admitting: Family Medicine

## 2017-01-12 NOTE — Telephone Encounter (Signed)
noted 

## 2017-01-12 NOTE — Telephone Encounter (Signed)
Refill once Ok. 

## 2017-01-12 NOTE — Telephone Encounter (Signed)
Pt is sch for prolia injection on 02-19-17

## 2017-01-21 ENCOUNTER — Telehealth: Payer: Self-pay

## 2017-01-27 NOTE — Telephone Encounter (Signed)
error 

## 2017-02-02 ENCOUNTER — Ambulatory Visit: Payer: Medicare Other | Admitting: Family Medicine

## 2017-02-09 DIAGNOSIS — M67341 Transient synovitis, right hand: Secondary | ICD-10-CM | POA: Diagnosis not present

## 2017-02-10 ENCOUNTER — Other Ambulatory Visit: Payer: Self-pay | Admitting: Orthopedic Surgery

## 2017-02-10 DIAGNOSIS — M67341 Transient synovitis, right hand: Secondary | ICD-10-CM

## 2017-02-19 ENCOUNTER — Ambulatory Visit (INDEPENDENT_AMBULATORY_CARE_PROVIDER_SITE_OTHER): Payer: Medicare Other

## 2017-02-19 DIAGNOSIS — Q782 Osteopetrosis: Secondary | ICD-10-CM | POA: Diagnosis not present

## 2017-02-19 MED ORDER — DENOSUMAB 60 MG/ML ~~LOC~~ SOLN
60.0000 mg | Freq: Once | SUBCUTANEOUS | Status: AC
  Administered 2017-02-19: 60 mg via SUBCUTANEOUS

## 2017-02-19 MED ORDER — LORAZEPAM 0.5 MG PO TABS: ORAL_TABLET | ORAL | 0 refills | Status: DC

## 2017-02-19 NOTE — Progress Notes (Signed)
Pt was in the office for her Prolia SQ injection. She c/o having increase anxiety due to a upcoming MRI scheduled for Sunday 02/21/17. Dr Elease Hashimoto was informed. Dr Elease Hashimoto assessed and advised to call in to pharmacy:  Ativan 0.5mg  Tablet, take one tab one hour before appointment, ensure that some drives you to appointment.

## 2017-02-21 ENCOUNTER — Ambulatory Visit
Admission: RE | Admit: 2017-02-21 | Discharge: 2017-02-21 | Disposition: A | Discharge status: Home / Self Care | Payer: Medicare Other | Source: Ambulatory Visit | Attending: Orthopedic Surgery | Admitting: Orthopedic Surgery

## 2017-02-21 DIAGNOSIS — M67341 Transient synovitis, right hand: Secondary | ICD-10-CM

## 2017-03-04 ENCOUNTER — Other Ambulatory Visit: Payer: Self-pay | Admitting: Family Medicine

## 2017-03-08 ENCOUNTER — Ambulatory Visit: Payer: Medicare Other | Admitting: Family Medicine

## 2017-03-18 DIAGNOSIS — L814 Other melanin hyperpigmentation: Secondary | ICD-10-CM | POA: Diagnosis not present

## 2017-03-18 DIAGNOSIS — L570 Actinic keratosis: Secondary | ICD-10-CM | POA: Diagnosis not present

## 2017-03-18 DIAGNOSIS — L821 Other seborrheic keratosis: Secondary | ICD-10-CM | POA: Diagnosis not present

## 2017-03-18 DIAGNOSIS — D225 Melanocytic nevi of trunk: Secondary | ICD-10-CM | POA: Diagnosis not present

## 2017-03-31 ENCOUNTER — Encounter: Payer: Self-pay | Admitting: Gynecology

## 2017-04-06 ENCOUNTER — Ambulatory Visit (INDEPENDENT_AMBULATORY_CARE_PROVIDER_SITE_OTHER): Payer: Medicare Other | Admitting: Family Medicine

## 2017-04-06 ENCOUNTER — Encounter: Payer: Self-pay | Admitting: Family Medicine

## 2017-04-06 VITALS — BP 138/70 | HR 73 | Temp 98.5°F | Wt 176.1 lb

## 2017-04-06 DIAGNOSIS — R7303 Prediabetes: Secondary | ICD-10-CM | POA: Diagnosis not present

## 2017-04-06 DIAGNOSIS — L509 Urticaria, unspecified: Secondary | ICD-10-CM | POA: Diagnosis not present

## 2017-04-06 DIAGNOSIS — K219 Gastro-esophageal reflux disease without esophagitis: Secondary | ICD-10-CM | POA: Diagnosis not present

## 2017-04-06 DIAGNOSIS — R7309 Other abnormal glucose: Secondary | ICD-10-CM

## 2017-04-06 LAB — POCT GLYCOSYLATED HEMOGLOBIN (HGB A1C): Hemoglobin A1C: 6.2

## 2017-04-06 NOTE — Progress Notes (Signed)
Subjective:     Patient ID: Shelly [name], female   DOB: August 11, 1946, 71 y.o.   MRN: [id number]  HPI Patient care to follow-up regarding several items  History of hyperglycemia/prediabetes. Last A1c 6.7%. Not treated with medication. Does not monitor blood sugars. No polyuria or polydipsia. She has tried to scale back her sugar and starch intake  Long history of GERD. Had some recent breakthrough symptoms. She took Nexium for while but had loose stools. She is currently taking Tagamet 200 mg twice daily. She's noted some fruits and vegetables tend irritate her reflux. Denies any pain with swallowing. No dysphagia.  Long history of recurrent hives. She seemed to do well over the winter but of started to occur more frequently past several weeks. She did notice after eating strawberries recently had diffuse hives. Currently takes over-the-counter Claritin. She has seen allergist once regarding this but does not recall any specific allergy testing. She's never had any angioedema symptoms.  Past Medical History:  Diagnosis Date  . Arthritis    wrist, shoulder   . Diverticulitis   . Endometrial polyp   . GERD (gastroesophageal reflux disease)    occasional - diet controlled  . Hx of adenomatous colonic polyps   . Hyperlipidemia   . IBS (irritable bowel syndrome)   . Missed abortion    x 1 - resolved - no surgery  . Osteoporosis   . SVD (spontaneous vaginal delivery)    x 1  . Vitamin D deficiency    Past Surgical History:  Procedure Laterality Date  . COLONOSCOPY    . DIAGNOSTIC LAPAROSCOPY     cysts  . DILATATION & CURETTAGE/HYSTEROSCOPY WITH TRUECLEAR N/A 10/31/2013   Procedure: DILATATION & CURETTAGE/HYSTEROSCOPY WITH TRUCLEAR;  Surgeon: Marylynn Pearson, MD;  Location: [facility];  Service: Gynecology;  Laterality: N/A;  . DILATION AND CURETTAGE OF UTERUS    . TONSILLECTOMY  1952  . WISDOM TOOTH EXTRACTION      reports that she quit smoking about 9 years ago. Her smoking use  included Cigarettes. She has a 2.00 pack-year smoking history. She has never used smokeless tobacco. She reports that she drinks about 8.4 oz of alcohol per week . She reports that she does not use drugs. family history includes Breast cancer in her maternal aunt; Breast cancer (age of onset: 30) in her cousin; Depression in her brother; Diabetes in her mother; Heart disease in her other; Hyperlipidemia in her father and mother; Hypertension in her mother and other; Leukemia in her mother; Lung cancer in her father; Uterine cancer in her other and paternal grandmother. Allergies  Allergen Reactions  . Crestor [Rosuvastatin Calcium]     GI upset  . Lipitor [Atorvastatin Calcium] Itching  . Pravastatin     GI upset  . Sulfonamide Derivatives     Hives, itiching     Review of Systems  Constitutional: Negative for activity change, appetite change, fatigue, fever and unexpected weight change.  HENT: Negative for ear pain, hearing loss, sore throat and trouble swallowing.   Eyes: Negative for visual disturbance.  Respiratory: Negative for cough and shortness of breath.   Cardiovascular: Negative for chest pain and palpitations.  Gastrointestinal: Negative for blood in stool, constipation and diarrhea.  Genitourinary: Negative for dysuria and hematuria.  Musculoskeletal: Negative for arthralgias, back pain and myalgias.  Skin: Positive for rash.  Neurological: Negative for dizziness, syncope and headaches.  Hematological: Negative for adenopathy.  Psychiatric/Behavioral: Negative for confusion and dysphoric mood.  Objective:   Physical Exam  Constitutional: She appears well-developed and well-nourished.  HENT:  Mouth/Throat: Oropharynx is clear and moist.  Neck: Neck supple. No thyromegaly present.  Cardiovascular: Normal rate and regular rhythm.   Pulmonary/Chest: Effort normal and breath sounds normal. No respiratory distress. She has no wheezes. She has no rales.   Musculoskeletal: She exhibits no edema.  Lymphadenopathy:    She has no cervical adenopathy.  Skin: Rash noted.  Patient has some diffuse somewhat confluent hives especially around her lower abdominal region and back region       Assessment:     #1 history of prediabetes. Repeat A1c today 6.2% which is improved  #2 long history of GERD-currently having some breakthrough symptoms on low dose Tagamet.  #3 recurrent hives-Trigger unclear. Patient has possible concerns regarding recent strawberry consumption    Plan:     -Continue low glycemic diet and regular exercise and recheck A1c in 6 months -Increase Tagamet to 400 mg twice daily -May need to consider another trial of PPI if not relieved with the above -Consider adding over-the counter Xyzal-1 daily -Consider follow-up with allergist if hives persist or tender occur -avoid strawberries for now.  Eulas Post MD [facility] at [facility]

## 2017-04-06 NOTE — Patient Instructions (Addendum)
Increase the Tagamet to 400 mg twice daily Consider OTC Xyzal as an anti-histamine. We will need to follow up with Dr Neldon Mc if hives persist.

## 2017-04-26 ENCOUNTER — Other Ambulatory Visit: Payer: Self-pay | Admitting: Family Medicine

## 2017-04-26 DIAGNOSIS — Z1231 Encounter for screening mammogram for malignant neoplasm of breast: Secondary | ICD-10-CM

## 2017-05-13 ENCOUNTER — Ambulatory Visit: Payer: Medicare Other

## 2017-05-18 ENCOUNTER — Ambulatory Visit
Admission: RE | Admit: 2017-05-18 | Discharge: 2017-05-18 | Disposition: A | Discharge status: Home / Self Care | Payer: Medicare Other | Source: Ambulatory Visit | Attending: Family Medicine | Admitting: Family Medicine

## 2017-05-18 DIAGNOSIS — Z1231 Encounter for screening mammogram for malignant neoplasm of breast: Secondary | ICD-10-CM

## 2017-06-14 ENCOUNTER — Encounter: Payer: Self-pay | Admitting: Family Medicine

## 2017-06-14 ENCOUNTER — Ambulatory Visit (INDEPENDENT_AMBULATORY_CARE_PROVIDER_SITE_OTHER): Payer: Medicare Other | Admitting: Family Medicine

## 2017-06-14 VITALS — BP 110/70 | HR 74 | Temp 98.6°F | Wt 176.2 lb

## 2017-06-14 DIAGNOSIS — M26629 Arthralgia of temporomandibular joint, unspecified side: Secondary | ICD-10-CM

## 2017-06-14 DIAGNOSIS — M549 Dorsalgia, unspecified: Secondary | ICD-10-CM

## 2017-06-14 NOTE — Patient Instructions (Signed)
Temporomandibular Joint Syndrome Temporomandibular joint (TMJ) syndrome is a condition that affects the joints between your jaw and your skull. The TMJs are located near your ears and allow your jaw to open and close. These joints and the nearby muscles are involved in all movements of the jaw. People with TMJ syndrome have pain in the area of these joints and muscles. Chewing, biting, or other movements of the jaw can be difficult or painful. TMJ syndrome can be caused by various things. In many cases, the condition is mild and goes away within a few weeks. For some people, the condition can become a long-term problem. What are the causes? Possible causes of TMJ syndrome include:  Grinding your teeth or clenching your jaw. Some people do this when they are under stress.  Arthritis.  Injury to the jaw.  Head or neck injury.  Teeth or dentures that are not aligned well.  In some cases, the cause of TMJ syndrome may not be known. What are the signs or symptoms? The most common symptom is an aching pain on the side of the head in the area of the TMJ. Other symptoms may include:  Pain when moving your jaw, such as when chewing or biting.  Being unable to open your jaw all the way.  Making a clicking sound when you open your mouth.  Headache.  Earache.  Neck or shoulder pain.  How is this diagnosed? Diagnosis can usually be made based on your symptoms, your medical history, and a physical exam. Your health care provider may check the range of motion of your jaw. Imaging tests, such as X-rays or an MRI, are sometimes done. You may need to see your dentist to determine if your teeth and jaw are lined up correctly. How is this treated? TMJ syndrome often goes away on its own. If treatment is needed, the options may include:  Eating soft foods and applying ice or heat.  Medicines to relieve pain or inflammation.  Medicines to relax the muscles.  A splint, bite plate, or  mouthpiece to prevent teeth grinding or jaw clenching.  Relaxation techniques or counseling to help reduce stress.  Transcutaneous electrical nerve stimulation (TENS). This helps to relieve pain by applying an electrical current through the skin.  Acupuncture. This is sometimes helpful to relieve pain.  Jaw surgery. This is rarely needed.  Follow these instructions at home:  Take medicines only as directed by your health care provider.  Eat a soft diet if you are having trouble chewing.  Apply ice to the painful area. ? Put ice in a plastic bag. ? Place a towel between your skin and the bag. ? Leave the ice on for 20 minutes, 2-3 times a day.  Apply a warm compress to the painful area as directed.  Massage your jaw area and perform any jaw stretching exercises as recommended by your health care provider.  If you were given a mouthpiece or bite plate, wear it as directed.  Avoid foods that require a lot of chewing. Do not chew gum.  Keep all follow-up visits as directed by your health care provider. This is important. Contact a health care provider if:  You are having trouble eating.  You have new or worsening symptoms. Get help right away if:  Your jaw locks open or closed. This information is not intended to replace advice given to you by your health care provider. Make sure you discuss any questions you have with your health care provider.  Document Released: 07/28/2001 Document Revised: 07/02/2016 Document Reviewed: 06/07/2014 Elsevier Interactive Patient Education  [employer].  We will set up physical therapy.  You should get a call from them.

## 2017-06-14 NOTE — Progress Notes (Signed)
Subjective:     Patient ID: Shelly [name], female   DOB: 1946-11-01, 71 y.o.   MRN: [id number]  HPI Patient seen to discuss bilateral jaw pain and upper back pain. She states that she and her husband just returned from trip to [place] last week. She's noticed for couple weeks now some increased bilateral TMJ syndrome. She's had problems with this in the past. She also states she has history of bruxism. She has a mouth guard but she states that needs to be replaced she thinks. She took some Librax which did not seem to help. She's had physical therapy in the past which had helped. She's had significant upper back stiffness and soreness and states that she tends to hold a lot of tension in that region.  Denies any radiculitis symptoms. No upper extremity numbness or weakness.  Past Medical History:  Diagnosis Date  . Arthritis    wrist, shoulder   . Diverticulitis   . Endometrial polyp   . GERD (gastroesophageal reflux disease)    occasional - diet controlled  . Hx of adenomatous colonic polyps   . Hyperlipidemia   . IBS (irritable bowel syndrome)   . Missed abortion    x 1 - resolved - no surgery  . Osteoporosis   . SVD (spontaneous vaginal delivery)    x 1  . Vitamin D deficiency    Past Surgical History:  Procedure Laterality Date  . COLONOSCOPY    . DIAGNOSTIC LAPAROSCOPY     cysts  . DILATATION & CURETTAGE/HYSTEROSCOPY WITH TRUECLEAR N/A 10/31/2013   Procedure: DILATATION & CURETTAGE/HYSTEROSCOPY WITH TRUCLEAR;  Surgeon: Marylynn Pearson, MD;  Location: [facility];  Service: Gynecology;  Laterality: N/A;  . DILATION AND CURETTAGE OF UTERUS    . TONSILLECTOMY  1952  . WISDOM TOOTH EXTRACTION      reports that she quit smoking about 10 years ago. Her smoking use included Cigarettes. She has a 2.00 pack-year smoking history. She has never used smokeless tobacco. She reports that she drinks about 8.4 oz of alcohol per week . She reports that she does not use drugs. family history  includes Breast cancer in her maternal aunt; Breast cancer (age of onset: 38) in her cousin; Depression in her brother; Diabetes in her mother; Heart disease in her other; Hyperlipidemia in her father and mother; Hypertension in her mother and other; Leukemia in her mother; Lung cancer in her father; Uterine cancer in her other and paternal grandmother. Allergies  Allergen Reactions  . Crestor [Rosuvastatin Calcium]     GI upset  . Lipitor [Atorvastatin Calcium] Itching  . Pravastatin     GI upset  . Sulfonamide Derivatives     Hives, itiching     Review of Systems  Constitutional: Negative for chills and fever.  HENT: Negative for ear pain and trouble swallowing.   Respiratory: Negative for cough and shortness of breath.   Cardiovascular: Negative for chest pain.  Musculoskeletal: Positive for back pain and neck pain.  Skin: Negative for rash.       Objective:   Physical Exam  Constitutional: She appears well-developed and well-nourished.  HENT:  Right Ear: External ear normal.  Left Ear: External ear normal.  She has tenderness over both TMJ joints. Oropharynx clear. Patient has some cerumen and the left canal but not totally obstructing.  Neck: Neck supple. No thyromegaly present.  Cardiovascular: Normal rate and regular rhythm.   Pulmonary/Chest: Effort normal and breath sounds normal. No respiratory distress. She has  no wheezes. She has no rales.  Musculoskeletal: She exhibits no edema.  She has muscle tension and tenderness bilaterally trapezius muscles. No spinal tenderness. Full range of motion with shoulders  Lymphadenopathy:    She has no cervical adenopathy.       Assessment:     #1 bilateral TMJ syndrome with history of bruxism  #2 bilateral upper back pain/stiffness    Plan:     -Patient will look at getting new mouth guard -Set up physical therapy -Cautious use of nonsteroidals with her history of GI issues  Eulas Post MD [facility]  at [facility]

## 2017-06-16 ENCOUNTER — Ambulatory Visit: Payer: Medicare Other | Attending: Family Medicine | Admitting: Physical Therapy

## 2017-06-16 ENCOUNTER — Encounter: Payer: Self-pay | Admitting: Physical Therapy

## 2017-06-16 DIAGNOSIS — M542 Cervicalgia: Secondary | ICD-10-CM | POA: Diagnosis not present

## 2017-06-16 DIAGNOSIS — M6281 Muscle weakness (generalized): Secondary | ICD-10-CM | POA: Insufficient documentation

## 2017-06-16 DIAGNOSIS — M62838 Other muscle spasm: Secondary | ICD-10-CM | POA: Diagnosis not present

## 2017-06-16 DIAGNOSIS — R293 Abnormal posture: Secondary | ICD-10-CM | POA: Insufficient documentation

## 2017-06-16 NOTE — Therapy (Signed)
[facility] [address] [place], [place], [address] Phone: [phone]   Fax:  [phone]  Physical Therapy Evaluation  Patient Details  Name: Shelly [name] MRN: [id number] Date of Birth: Dec 13, 1945 Referring Provider: Dr. Carolann Littler  Encounter Date: 06/16/2017      PT End of Session - 06/16/17 0918    Visit Number 1   Number of Visits 10   Date for PT Re-Evaluation 08/11/17   Authorization Type medicare g-code 10th visit; kx modifier for 15th visit   PT Start Time 0845   PT Stop Time 0919   PT Time Calculation (min) 34 min   Activity Tolerance Patient tolerated treatment well   Behavior During Therapy [facility] for tasks assessed/performed;Anxious      Past Medical History:  Diagnosis Date  . Arthritis    wrist, shoulder   . Diverticulitis   . Endometrial polyp   . GERD (gastroesophageal reflux disease)    occasional - diet controlled  . Hx of adenomatous colonic polyps   . Hyperlipidemia   . IBS (irritable bowel syndrome)   . Missed abortion    x 1 - resolved - no surgery  . Osteoporosis   . SVD (spontaneous vaginal delivery)    x 1  . Vitamin D deficiency     Past Surgical History:  Procedure Laterality Date  . COLONOSCOPY    . DIAGNOSTIC LAPAROSCOPY     cysts  . DILATATION & CURETTAGE/HYSTEROSCOPY WITH TRUECLEAR N/A 10/31/2013   Procedure: DILATATION & CURETTAGE/HYSTEROSCOPY WITH TRUCLEAR;  Surgeon: Marylynn Pearson, MD;  Location: [facility];  Service: Gynecology;  Laterality: N/A;  . DILATION AND CURETTAGE OF UTERUS    . TONSILLECTOMY  1952  . WISDOM TOOTH EXTRACTION      There were no vitals filed for this visit.       Subjective Assessment - 06/16/17 0848    Subjective Pain started 1 week ago in TMJ and upper back.  Patient has a night guard and it needs to be updated. I get headaches due to grinding teeth.  Upper back in shoulders and neck get sore.     Patient Stated Goals find  exercise in the morning to help pain and relax the muscles   Currently in Pain? Yes   Pain Score 6    Pain Location Neck   Pain Orientation Right;Left   Pain Descriptors / Indicators Sore;Tightness   Pain Type Acute pain   Pain Onset 1 to 4 weeks ago   Pain Frequency Intermittent   Aggravating Factors  grinding teeth, AM, carrying purse   Pain Relieving Factors heat, stretch   Multiple Pain Sites Yes   Pain Score 6   Pain Location Jaw   Pain Orientation Right;Left   Pain Descriptors / Indicators Burning;Sore;Aching   Pain Type Acute pain   Pain Onset 1 to 4 weeks ago   Pain Frequency Intermittent   Aggravating Factors  eating, AM   Pain Relieving Factors heat            [facility] PT Assessment - 06/16/17 0001      Assessment   Medical Diagnosis M54.9 upper back pain   Referring Provider Dr. Carolann Littler   Onset Date/Surgical Date 06/09/17   Prior Therapy Yes in the past     Precautions   Precautions Other (comment)   Precaution Comments osteoporosis     Restrictions   Weight Bearing Restrictions No     Balance Screen  Has the patient fallen in the past 6 months No   Has the patient had a decrease in activity level because of a fear of falling?  No   Is the patient reluctant to leave their home because of a fear of falling?  No     Home Ecologist residence     Prior Function   Level of Independence Independent     Cognition   Overall Cognitive Status Within Functional Limits for tasks assessed     Observation/Other Assessments   Focus on Therapeutic Outcomes (FOTO)  51% limitation  goal is 36% limitation     Posture/Postural Control   Posture/Postural Control Postural limitations   Postural Limitations Rounded Shoulders;Forward head;Increased thoracic kyphosis   Posture Comments lower jaw is 1/2 tooth to the right; significant thoracic kyphosis     ROM / Strength   AROM / PROM / Strength AROM;Strength     AROM    Overall AROM Comments full Shoulder ROM   AROM Assessment Site Cervical   Cervical Flexion full   Cervical Extension decreased by 25%   Cervical - Right Side Bend decreased by 25% with pain   Cervical - Left Side Bend decreased by 25% with pain   Cervical - Right Rotation full   Cervical - Left Rotation decreased by 50% with pain   Thoracic - Right Rotation decreased by 50%   Thoracic - Left Rotation decreased by 50%   Jaw-Incisal Opening  7mm  soreness   Jaw-Left Lateral Excurison 48mm  tight   Jaw-Right Lateral Excursion 28mm  tightness     Strength   Overall Strength Comments bil. shoulder strength 4/5     Palpation   Palpation comment clicking in right TMJ; tenderness located in bil. upper trap, cervical paraspinals, lateral pterygoid, masseter, temporalis, scalp, subocciptials     Transfers   Transfers Not assessed     Ambulation/Gait   Ambulation/Gait No            Objective measurements completed on examination: See above findings.                  PT Education - 06/16/17 [phone]    Education provided Yes   Education Details information on osteo; postural awareness and body mechanics   Person(s) Educated Patient   Methods Explanation;Demonstration;Verbal cues;Handout   Comprehension Returned demonstration;Verbalized understanding          PT Short Term Goals - 06/16/17 0925      PT SHORT TERM GOAL #1   Title independent with initial HEP   Time 4   Period Weeks   Status New   Target Date 08/14/17     PT SHORT TERM GOAL #2   Title pain with daily activities is moderate due to improved awareness of posture   Time 4   Period Weeks   Status New   Target Date 08/14/17     PT SHORT TERM GOAL #3   Title wake up with moderate pain due to improved sleeping posture and trigger points in neck and Jaw   Time 4   Period Weeks   Status New   Target Date 08/14/17     PT SHORT TERM GOAL #4   Title understand postures not to do with  osteoporosis and what osteoporosis is about   Time 4   Period Weeks   Status New   Target Date 08/14/17           PT  Long Term Goals - 06/16/17 0912      PT LONG TERM GOAL #1   Title Independent with HEP   Time 8   Period Weeks   Status New   Target Date 08/11/17     PT LONG TERM GOAL #2   Title understand correct body mechanics to decrease strain on neck and upper back    Time 8   Period Weeks   Status New   Target Date 08/11/17     PT LONG TERM GOAL #3   Title wake up in the morning with minimal pain due to understanding correct sleeping position and improve tissue mobility   Time 8   Period Weeks   Status New   Target Date 08/11/17     PT LONG TERM GOAL #4   Title Bilateral TMJ pain decreased to minimal due to improve mobility and reduction in trigger points so headaches are decreased by 50%   Time 8   Period Weeks   Status New   Target Date 08/11/17     PT LONG TERM GOAL #5   Title upper back pain decreased to minimal with daily activities due to improved postural strength and reduction of muscle tightness   Time 8   Period Weeks   Status New   Target Date 08/11/17     Additional Long Term Goals   Additional Long Term Goals Yes     PT LONG TERM GOAL #6   Title FOTO score </= 36% limitation   Time 8   Period Weeks   Status New   Target Date 08/11/17                Plan - 06/16/17 0919    Clinical Impression Statement Patient is a 71 year old female with recent flare-up of her TMJ, neck and upper back pain at level 6/10 intermittently.  Patient reports daily tasks, waking up in the morning and tension increase her pain.  She is now having headaches daily instead of 1 time per week due to the TMJ pain. Patient has decreased cervical and TMJ mobility with pain.  Patient posture consists of significant thoracic kyphosis and forward head putting strain on the upper body and TMJ. Patient will benefit from skilled therapy to reduce pain, improve  postural strength, improve mobility  and educate on correct body mechanics so she is able to return to prior function.    History and Personal Factors relevant to plan of care: osteoporosis   Clinical Presentation Stable   Clinical Presentation due to: stable condition   Clinical Decision Making Low   Rehab Potential Excellent   Clinical Impairments Affecting Rehab Potential osteoporosis   PT Frequency 2x / week   PT Duration 8 weeks   PT Treatment/Interventions Electrical Stimulation;Moist Heat;Traction;Ultrasound;Patient/family education;Neuromuscular re-education;Therapeutic exercise;Therapeutic activities;Manual techniques;Passive range of motion;Dry needling;Energy conservation;Taping   PT Next Visit Plan dry needling to upper trap and cervical paraspinals; soft tissue work; postural strength; TMJ strength and mobility; things not to do with TMJ, interscapular strength   PT Home Exercise Plan progress as needed   Consulted and Agree with Plan of Care Patient      Patient will benefit from skilled therapeutic intervention in order to improve the following deficits and impairments:  Decreased range of motion, Increased fascial restricitons, Increased muscle spasms, Decreased endurance, Decreased activity tolerance, Pain, Impaired flexibility, Improper body mechanics, Decreased strength, Decreased mobility  Visit Diagnosis: Cervicalgia - Plan: PT plan of care cert/re-cert  Muscle weakness (  generalized) - Plan: PT plan of care cert/re-cert  Other muscle spasm - Plan: PT plan of care cert/re-cert  Abnormal posture - Plan: PT plan of care cert/re-cert      G-Codes - 26/33/35 0930    Functional Assessment Tool Used (Outpatient Only) FOTO score is 51% limitation  goal is 36% limitation   Functional Limitation Other PT primary   Other PT Primary Current Status (K5625) At least 40 percent but less than 60 percent impaired, limited or restricted   Other PT Primary Goal Status (W3893) At  least 20 percent but less than 40 percent impaired, limited or restricted       Problem List Patient Active Problem List   Diagnosis Date Noted  . Prediabetes 04/06/2017  . Osteoporosis 06/12/2015  . IBS (irritable bowel syndrome) 08/21/2014  . Hyperlipidemia 08/05/2011  . ABDOMINAL PAIN RIGHT UPPER QUADRANT 10/30/2008  . ABDOMINAL BLOATING 10/25/2008  . GERD 06/04/2008  . DIVERTICULOSIS OF COLON 06/04/2008    Earlie Counts, PT 06/16/17 9:35 AM    [facility] [address], [place], [place], [address] Phone: [phone]   Fax:  [phone]  Name: Shelly [name] MRN: [id number] Date of Birth: Nov 27, 1945

## 2017-06-16 NOTE — Patient Instructions (Addendum)
Dental guard at the store [employer] an object (jewel, tie, shelf with hot cup of coffee) is located at the top of your breastbone. Show off this jewel. Keep "tie high". Balance the cup of coffee. Keep chest up. Practice frequently during the day.  Do every hour of the day.   Copyright  VHI. All rights reserved.    Three Part Breath (Sitting or Standing)    One hand on belly between navel and pubic bone, other hand on upper ribs. Breathe IN, expanding abdomen, lower and then upper ribs. Breathe OUT in opposite direction. 10 breaths 3 times per day Copyright  VHI. All rights reserved.  POSTURAL CORRECTION Tips A  GOOD POSTURE (better body alignment) will: -distribute weight bearing forces throughout the skeleton more correctly, thereby helping to: -stimulate the growth and maintenance of stronger, healthier, denser bones, and -reduce the risk of fracture, other injury or back pain.   Copyright  VHI. All rights reserved.  Foot [employer] / [employer], feet hip-width apart, on [employer]. Even weight between feet. Even weight between ball and heel of each foot. Press feet into floor. Feel postural muscles contract. Practice frequently during the day.  Copyright  VHI. All rights reserved.                                             DO's and DON'T's   Avoid and/or Minimize positions of forward bending ( flexion)  Side bending and rotation of the trunk  Especially when movements occur together   When your back aches:   Don't sit down   Lie down on your back with a small pillow under your head and one under your knees or as outlined by our therapist. Or, lie in the 90/90 position ( on the floor with your feet and legs on the sofa with knees and hips bent to 90 degrees)  Tying or putting on your shoes:   Don't bend over to tie your shoes or put on socks.  Instead, bring one foot up, cross it over the opposite knee and bend forward (hinge) at  the hips to so the task.  Keep your back straight.  If you cannot do this safely, then you need to use long handled assistive devices such as a shoehorn and sock puller.  Exercising:  Don't engage in ballistic types of exercise routines such as high-impact aerobics or jumping rope  Don't do exercises in the gym that bring you forward (abdominal crunches, sit-ups, touching your  toes, knee-to-chest, straight leg raising.)  Follow a regular exercise program that includes a variety of different weight-bearing activities, such as low-impact aerobics, T' ai chi or walking as your physical therapist advises  Do exercises that emphasize return to normal body alignment and strengthening of the muscles that keep your back straight, as outlined in this program or by your therapist  Household tasks:  Don't reach unnecessarily or twist your trunk when mopping, sweeping, vacuuming, raking, making beds, weeding gardens, getting objects ou of cupboards, etc.  Keep your broom, mop, vacuum, or rake close to you and mover your whole body as you move them. Walk over to the area on which you are working. Arrange kitchen, bathroom, and bedroom shelves so that frequently used items may be reached without excessive bending, twisting, and reaching.  Use a  sturdy stool if necessary.  Don't bend from the waist to pick up something up  Off the floor, out of the trunk of your car, or to brush your teeth, wash your face, etc.   Bend at the knees, keeping back straight as possible. Use a reacher if necessary.   Prevention of fracture is the so-called "BOTTOm -Line" in the management of OSTEOPOROSIS. Do not take unnecessary chances in movement. Once a compression fracture occurs, the process is very difficult to control; one fracture is frequently followed by many more.  Housework: Vacuuming    DO: Keep vacuum cleaner close to body. Lean into task with whole body. Minimize bending and rotation of the back. Bend knees to  avoid back strain. Vacuum with short motions. Keep one foot ahead of the other during back and forth motions. Walk over to corners and hard-to-reach areas. DON'T: Reach with vacuum into corners and hard-to-reach areas.  Copyright  VHI. All rights reserved.  Housework: Sweeping    DO: Keep broom close to body. Keep back straight. Minimize bending and rotation of the back. Bend knees to avoid back strain. Sweep with short motions. Walk over to corners and hard-to-reach areas. DON'T: Reach with broom into corners and hard-to-reach areas.  Copyright  VHI. All rights reserved.  Eating    Sit, protecting natural arch in low back. Bring food to mouth, not mouth to food. Do not lean on elbows or arms.  Copyright  VHI. All rights reserved.  [facility] [address], [place], [place] [address] Phone # [phone] Fax [phone]

## 2017-06-21 ENCOUNTER — Ambulatory Visit: Payer: Medicare Other

## 2017-06-21 DIAGNOSIS — M542 Cervicalgia: Secondary | ICD-10-CM

## 2017-06-21 DIAGNOSIS — M62838 Other muscle spasm: Secondary | ICD-10-CM | POA: Diagnosis not present

## 2017-06-21 DIAGNOSIS — M6281 Muscle weakness (generalized): Secondary | ICD-10-CM

## 2017-06-21 DIAGNOSIS — R293 Abnormal posture: Secondary | ICD-10-CM

## 2017-06-21 NOTE — Patient Instructions (Addendum)

## 2017-06-21 NOTE — Therapy (Signed)
[facility] [address] [place], [place], [address] Phone: [phone]   Fax:  [phone]  Physical Therapy Treatment  Patient Details  Name: Shelly [name] MRN: [id number] Date of Birth: 04/08/1946 Referring Provider: Dr. Carolann Littler  Encounter Date: 06/21/2017      PT End of Session - 06/21/17 0929    Visit Number 2   Number of Visits 10   Date for PT Re-Evaluation 08/11/17   Authorization Type medicare g-code 10th visit; kx modifier for 15th visit   PT Start Time 0847  dry needling   PT Stop Time 0940   PT Time Calculation (min) 53 min   Activity Tolerance Patient tolerated treatment well   Behavior During Therapy [facility] for tasks assessed/performed      Past Medical History:  Diagnosis Date  . Arthritis    wrist, shoulder   . Diverticulitis   . Endometrial polyp   . GERD (gastroesophageal reflux disease)    occasional - diet controlled  . Hx of adenomatous colonic polyps   . Hyperlipidemia   . IBS (irritable bowel syndrome)   . Missed abortion    x 1 - resolved - no surgery  . Osteoporosis   . SVD (spontaneous vaginal delivery)    x 1  . Vitamin D deficiency     Past Surgical History:  Procedure Laterality Date  . COLONOSCOPY    . DIAGNOSTIC LAPAROSCOPY     cysts  . DILATATION & CURETTAGE/HYSTEROSCOPY WITH TRUECLEAR N/A 10/31/2013   Procedure: DILATATION & CURETTAGE/HYSTEROSCOPY WITH TRUCLEAR;  Surgeon: Marylynn Pearson, MD;  Location: [facility];  Service: Gynecology;  Laterality: N/A;  . DILATION AND CURETTAGE OF UTERUS    . TONSILLECTOMY  1952  . WISDOM TOOTH EXTRACTION      There were no vitals filed for this visit.      Subjective Assessment - 06/21/17 0852    Subjective Pt has been working on deep breathing and posture.    Patient Stated Goals find exercise in the morning to help pain and relax the muscles   Currently in Pain? Yes   Pain Score 6    Pain Location Neck   Pain  Orientation Right;Left   Pain Descriptors / Indicators Sore;Tightness   Pain Type Acute pain   Pain Onset More than a month ago   Pain Frequency Intermittent   Aggravating Factors  houswork, carrying purse, bad posture   Pain Relieving Factors heat, stretching                         [facility] Adult PT Treatment/Exercise - 06/21/17 0001      Exercises   Exercises Shoulder;Neck     Neck Exercises: Seated   Other Seated Exercise scapular squeezes and deep breathing     Modalities   Modalities Moist Heat     Moist Heat Therapy   Number Minutes Moist Heat 15 Minutes   Moist Heat Location Cervical     Manual Therapy   Manual Therapy Soft tissue mobilization;Myofascial release   Manual therapy comments soft tissue elongation to bil upper traps and thoracic paraspinals.                PT Education - 06/21/17 0902    Education provided (P)  Yes   Education Details (P)  dn info   Person(s) Educated (P)  Patient   Methods (P)  Explanation;Demonstration;Handout   Comprehension (P)  Verbalized understanding;Returned demonstration  PT Short Term Goals - 06/21/17 0853      PT SHORT TERM GOAL #1   Title independent with initial HEP   Time 4   Period Weeks   Status On-going     PT SHORT TERM GOAL #2   Title pain with daily activities is moderate due to improved awareness of posture   Time 4   Period Weeks   Status On-going     PT SHORT TERM GOAL #3   Title wake up with moderate pain due to improved sleeping posture and trigger points in neck and Jaw   Time 4   Period Weeks   Status On-going           PT Long Term Goals - 06/16/17 0912      PT LONG TERM GOAL #1   Title Independent with HEP   Time 8   Period Weeks   Status New   Target Date 08/11/17     PT LONG TERM GOAL #2   Title understand correct body mechanics to decrease strain on neck and upper back    Time 8   Period Weeks   Status New   Target Date 08/11/17     PT  LONG TERM GOAL #3   Title wake up in the morning with minimal pain due to understanding correct sleeping position and improve tissue mobility   Time 8   Period Weeks   Status New   Target Date 08/11/17     PT LONG TERM GOAL #4   Title Bilateral TMJ pain decreased to minimal due to improve mobility and reduction in trigger points so headaches are decreased by 50%   Time 8   Period Weeks   Status New   Target Date 08/11/17     PT LONG TERM GOAL #5   Title upper back pain decreased to minimal with daily activities due to improved postural strength and reduction of muscle tightness   Time 8   Period Weeks   Status New   Target Date 08/11/17     Additional Long Term Goals   Additional Long Term Goals Yes     PT LONG TERM GOAL #6   Title FOTO score </= 36% limitation   Time 8   Period Weeks   Status New   Target Date 08/11/17               Plan - 06/21/17 0854    Clinical Impression Statement Pt with only 1 session after evaluation.  Pt has been working on postural corrections and avoiding flexion with activity at home.  Pt with trigger points in thoracic spine and upper traps and demonstrated improved mobility and reduced tension in neck and jaw after dry needling today.  Pt was anxious with dry needling today.   Pt with thoracic kyphosis and weak thoracic stabilizers.  Pt will benefit from skilled PT for strength, manual, body mechanics education and flexibility.     Rehab Potential Excellent   PT Frequency 2x / week   PT Duration 8 weeks   PT Treatment/Interventions Electrical Stimulation;Moist Heat;Traction;Ultrasound;Patient/family education;Neuromuscular re-education;Therapeutic exercise;Therapeutic activities;Manual techniques;Passive range of motion;Dry needling;Energy conservation;Taping   PT Next Visit Plan assess response to dry needling to upper trap and thoracic paraspinals; soft tissue work; postural strength; TMJ strength and mobility; things not to do with TMJ,  interscapular strength   Consulted and Agree with Plan of Care Patient      Patient will benefit from skilled therapeutic intervention in  order to improve the following deficits and impairments:  Decreased range of motion, Increased fascial restricitons, Increased muscle spasms, Decreased endurance, Decreased activity tolerance, Pain, Impaired flexibility, Improper body mechanics, Decreased strength, Decreased mobility  Visit Diagnosis: Cervicalgia  Muscle weakness (generalized)  Other muscle spasm  Abnormal posture     Problem List Patient Active Problem List   Diagnosis Date Noted  . Prediabetes 04/06/2017  . Osteoporosis 06/12/2015  . IBS (irritable bowel syndrome) 08/21/2014  . Hyperlipidemia 08/05/2011  . ABDOMINAL PAIN RIGHT UPPER QUADRANT 10/30/2008  . ABDOMINAL BLOATING 10/25/2008  . GERD 06/04/2008  . DIVERTICULOSIS OF COLON 06/04/2008     Shelly [name], PT 06/21/17 9:31 AM  [facility] [address] [place], [place], [address] Phone: [phone]   Fax:  [phone]  Name: Shelly [name] MRN: [id number] Date of Birth: 19-Feb-1946

## 2017-06-23 ENCOUNTER — Telehealth: Payer: Self-pay | Admitting: Family Medicine

## 2017-06-23 NOTE — Telephone Encounter (Signed)
Pt is due for prolia injection

## 2017-06-24 ENCOUNTER — Ambulatory Visit: Payer: Medicare Other | Admitting: Rehabilitation

## 2017-06-24 ENCOUNTER — Encounter: Payer: Self-pay | Admitting: Rehabilitation

## 2017-06-24 DIAGNOSIS — M62838 Other muscle spasm: Secondary | ICD-10-CM

## 2017-06-24 DIAGNOSIS — M6281 Muscle weakness (generalized): Secondary | ICD-10-CM

## 2017-06-24 DIAGNOSIS — R293 Abnormal posture: Secondary | ICD-10-CM | POA: Diagnosis not present

## 2017-06-24 DIAGNOSIS — M542 Cervicalgia: Secondary | ICD-10-CM

## 2017-06-24 NOTE — Therapy (Signed)
[facility] [address], [place], [place], [address] Phone: [phone]   Fax:  [phone]  Physical Therapy Treatment  Patient Details  Name: Shelly [name] MRN: [id number] Date of Birth: 1946-10-03 Referring Provider: Dr. Carolann Littler  Encounter Date: 06/24/2017      PT End of Session - 06/24/17 1157    Visit Number 3   Number of Visits 10   Date for PT Re-Evaluation 08/11/17   Authorization Type medicare g-code 10th visit; kx modifier for 15th visit   PT Start Time 0930   PT Stop Time 1025   PT Time Calculation (min) 55 min   Activity Tolerance Patient tolerated treatment well      Past Medical History:  Diagnosis Date  . Arthritis    wrist, shoulder   . Diverticulitis   . Endometrial polyp   . GERD (gastroesophageal reflux disease)    occasional - diet controlled  . Hx of adenomatous colonic polyps   . Hyperlipidemia   . IBS (irritable bowel syndrome)   . Missed abortion    x 1 - resolved - no surgery  . Osteoporosis   . SVD (spontaneous vaginal delivery)    x 1  . Vitamin D deficiency     Past Surgical History:  Procedure Laterality Date  . COLONOSCOPY    . DIAGNOSTIC LAPAROSCOPY     cysts  . DILATATION & CURETTAGE/HYSTEROSCOPY WITH TRUECLEAR N/A 10/31/2013   Procedure: DILATATION & CURETTAGE/HYSTEROSCOPY WITH TRUCLEAR;  Surgeon: Marylynn Pearson, MD;  Location: [facility];  Service: Gynecology;  Laterality: N/A;  . DILATION AND CURETTAGE OF UTERUS    . TONSILLECTOMY  1952  . WISDOM TOOTH EXTRACTION      There were no vitals filed for this visit.      Subjective Assessment - 06/24/17 0932    Subjective Sore in bilateral jaw region today and a little in the neck.  The needling helped but made herself sore doing the checkbook yesterday.     Currently in Pain? Yes   Pain Score 5    Pain Location Neck  and jaw   Pain Orientation Right;Left                          [facility] Adult PT Treatment/Exercise - 06/24/17 0001      Self-Care   Self-Care Other Self-Care Comments   Other Self-Care Comments  masseter release, resting jaw position, avoiding activities for TMJ with handout of stretches     Neck Exercises: Supine   Neck Retraction 10 reps;5 secs   Neck Retraction Limitations manual assistance to decrease L lateral flexion     Modalities   Modalities Moist Heat     Moist Heat Therapy   Number Minutes Moist Heat 15 Minutes   Moist Heat Location Cervical     Manual Therapy   Manual Therapy Soft tissue mobilization;Myofascial release;Joint mobilization   Manual therapy comments manual stretching bil UT, scalenes, masseter   Joint Mobilization gentle internal TMJ distraction   Soft tissue mobilization supine to bil UT, cervical paraspinals, suboccipitals, scalenes, masseter                PT Education - 06/24/17 1157    Education provided Yes   Education Details TMJ self care, stretches for neck and TMJ   Person(s) Educated Patient   Methods Explanation;Demonstration;Handout   Comprehension Need further instruction  PT Short Term Goals - 06/21/17 0853      PT SHORT TERM GOAL #1   Title independent with initial HEP   Time 4   Period Weeks   Status On-going     PT SHORT TERM GOAL #2   Title pain with daily activities is moderate due to improved awareness of posture   Time 4   Period Weeks   Status On-going     PT SHORT TERM GOAL #3   Title wake up with moderate pain due to improved sleeping posture and trigger points in neck and Jaw   Time 4   Period Weeks   Status On-going           PT Long Term Goals - 06/16/17 0912      PT LONG TERM GOAL #1   Title Independent with HEP   Time 8   Period Weeks   Status New   Target Date 08/11/17     PT LONG TERM GOAL #2   Title understand correct body mechanics to decrease strain on neck and upper back    Time 8   Period Weeks   Status New   Target Date  08/11/17     PT LONG TERM GOAL #3   Title wake up in the morning with minimal pain due to understanding correct sleeping position and improve tissue mobility   Time 8   Period Weeks   Status New   Target Date 08/11/17     PT LONG TERM GOAL #4   Title Bilateral TMJ pain decreased to minimal due to improve mobility and reduction in trigger points so headaches are decreased by 50%   Time 8   Period Weeks   Status New   Target Date 08/11/17     PT LONG TERM GOAL #5   Title upper back pain decreased to minimal with daily activities due to improved postural strength and reduction of muscle tightness   Time 8   Period Weeks   Status New   Target Date 08/11/17     Additional Long Term Goals   Additional Long Term Goals Yes     PT LONG TERM GOAL #6   Title FOTO score </= 36% limitation   Time 8   Period Weeks   Status New   Target Date 08/11/17               Plan - 06/24/17 1158    Clinical Impression Statement Did have a good response to dry needling per patient and willing to do this again.  Return of pain with tension/stress and looking down activities.  Treatment today focused on TMJ self care as well as manual STM.  Pt with tension and pain in bilateral UT and cerivcal paraspinals as well as the massester bilaterally R>L.  Chin tuck in supine resulting in cervical lateral flexion to the left, improved with cueing   PT Frequency 2x / week   PT Duration 8 weeks   PT Treatment/Interventions Electrical Stimulation;Moist Heat;Traction;Ultrasound;Patient/family education;Neuromuscular re-education;Therapeutic exercise;Therapeutic activities;Manual techniques;Passive range of motion;Dry needling;Energy conservation;Taping   PT Next Visit Plan continues needling, STM, postural strength, modalities PRN,    (pt requested upon leaving if we could incorporate some internal TMJ muscular release as she had success with this in the past)   PT Home Exercise Plan progress as needed       Patient will benefit from skilled therapeutic intervention in order to improve the following deficits and impairments:  Decreased range  of motion, Increased fascial restricitons, Increased muscle spasms, Decreased endurance, Decreased activity tolerance, Pain, Impaired flexibility, Improper body mechanics, Decreased strength, Decreased mobility  Visit Diagnosis: Muscle weakness (generalized)  Cervicalgia  Other muscle spasm  Abnormal posture     Problem List Patient Active Problem List   Diagnosis Date Noted  . Prediabetes 04/06/2017  . Osteoporosis 06/12/2015  . IBS (irritable bowel syndrome) 08/21/2014  . Hyperlipidemia 08/05/2011  . ABDOMINAL PAIN RIGHT UPPER QUADRANT 10/30/2008  . ABDOMINAL BLOATING 10/25/2008  . GERD 06/04/2008  . DIVERTICULOSIS OF COLON 06/04/2008    Stark Bray, DPT, CMP 06/24/2017, 12:08 PM  [facility] [address] [place], [place], [address] Phone: [phone]   Fax:  [phone]  Name: Shelly [name] MRN: [id number] Date of Birth: [birth year]

## 2017-06-25 NOTE — Telephone Encounter (Signed)
Shelly [name] pt would like to come in on September 6 for another prolia inj and would like to see if it is okay to go ahead and schedule.

## 2017-06-28 ENCOUNTER — Encounter: Payer: Self-pay | Admitting: Rehabilitation

## 2017-06-28 ENCOUNTER — Ambulatory Visit: Payer: Medicare Other | Admitting: Rehabilitation

## 2017-06-28 DIAGNOSIS — M6281 Muscle weakness (generalized): Secondary | ICD-10-CM

## 2017-06-28 DIAGNOSIS — M542 Cervicalgia: Secondary | ICD-10-CM

## 2017-06-28 DIAGNOSIS — R293 Abnormal posture: Secondary | ICD-10-CM

## 2017-06-28 DIAGNOSIS — M62838 Other muscle spasm: Secondary | ICD-10-CM

## 2017-06-28 NOTE — Therapy (Signed)
[facility] [address] [place], [place], [address] Phone: [phone]   Fax:  [phone]  Physical Therapy Treatment  Patient Details  Name: Shelly [name] MRN: [id number] Date of Birth: [birth year] Referring Provider: Dr. Carolann Littler  Encounter Date: 06/28/2017      PT End of Session - 06/28/17 1030    Visit Number 4   Number of Visits 10   Date for PT Re-Evaluation 08/11/17   Authorization Type medicare g-code 10th visit; kx modifier for 15th visit   PT Start Time 1016   PT Stop Time 1112   PT Time Calculation (min) 56 min   Activity Tolerance Patient tolerated treatment well      Past Medical History:  Diagnosis Date  . Arthritis    wrist, shoulder   . Diverticulitis   . Endometrial polyp   . GERD (gastroesophageal reflux disease)    occasional - diet controlled  . Hx of adenomatous colonic polyps   . Hyperlipidemia   . IBS (irritable bowel syndrome)   . Missed abortion    x 1 - resolved - no surgery  . Osteoporosis   . SVD (spontaneous vaginal delivery)    x 1  . Vitamin D deficiency     Past Surgical History:  Procedure Laterality Date  . COLONOSCOPY    . DIAGNOSTIC LAPAROSCOPY     cysts  . DILATATION & CURETTAGE/HYSTEROSCOPY WITH TRUECLEAR N/A 10/31/2013   Procedure: DILATATION & CURETTAGE/HYSTEROSCOPY WITH TRUCLEAR;  Surgeon: Marylynn Pearson, MD;  Location: [facility];  Service: Gynecology;  Laterality: N/A;  . DILATION AND CURETTAGE OF UTERUS    . TONSILLECTOMY  1952  . WISDOM TOOTH EXTRACTION      There were no vitals filed for this visit.      Subjective Assessment - 06/28/17 1019    Subjective reports the jaw has been doing well but then woke up with some reflux on saturday which increased the jaw pain.  still a bit aggravated this morning.  Felt good after last session.  The upper neck is feeling okay today   Currently in Pain? Yes   Pain Score 3    Pain Location Neck   Pain Orientation Right;Left   Pain Descriptors / Indicators Aching;Tightness   Pain Type Acute pain   Pain Onset More than a month ago   Aggravating Factors  housework, carrying purse, bad posture   Pain Relieving Factors heat, stretching                         [facility] Adult PT Treatment/Exercise - 06/28/17 0001      Neck Exercises: Supine   Neck Retraction 5 reps;10 reps   Neck Retraction Limitations vcs to decrease force     Moist Heat Therapy   Number Minutes Moist Heat 15 Minutes   Moist Heat Location Cervical     Manual Therapy   Manual Therapy Soft tissue mobilization;Myofascial release;Joint mobilization   Manual therapy comments manual stretching bil UT, scalenes, masseter   Joint Mobilization gentle internal TMJ distraction   Soft tissue mobilization supine to bil UT, cervical paraspinals, suboccipitals, scalenes, masseter, lateral pterygoid release     Neck Exercises: Stretches   Upper Trapezius Stretch 2 reps;30 seconds   Corner Stretch 2 reps;30 seconds  doorway   Other Neck Stretches reviewed HEP stretches; self masseter stretch x30", scalenes bil, and anterior neck seated,  PT Short Term Goals - 06/21/17 0853      PT SHORT TERM GOAL #1   Title independent with initial HEP   Time 4   Period Weeks   Status On-going     PT SHORT TERM GOAL #2   Title pain with daily activities is moderate due to improved awareness of posture   Time 4   Period Weeks   Status On-going     PT SHORT TERM GOAL #3   Title wake up with moderate pain due to improved sleeping posture and trigger points in neck and Jaw   Time 4   Period Weeks   Status On-going           PT Long Term Goals - 06/16/17 0912      PT LONG TERM GOAL #1   Title Independent with HEP   Time 8   Period Weeks   Status New   Target Date 08/11/17     PT LONG TERM GOAL #2   Title understand correct body mechanics to decrease strain on neck and upper back     Time 8   Period Weeks   Status New   Target Date 08/11/17     PT LONG TERM GOAL #3   Title wake up in the morning with minimal pain due to understanding correct sleeping position and improve tissue mobility   Time 8   Period Weeks   Status New   Target Date 08/11/17     PT LONG TERM GOAL #4   Title Bilateral TMJ pain decreased to minimal due to improve mobility and reduction in trigger points so headaches are decreased by 50%   Time 8   Period Weeks   Status New   Target Date 08/11/17     PT LONG TERM GOAL #5   Title upper back pain decreased to minimal with daily activities due to improved postural strength and reduction of muscle tightness   Time 8   Period Weeks   Status New   Target Date 08/11/17     Additional Long Term Goals   Additional Long Term Goals Yes     PT LONG TERM GOAL #6   Title FOTO score </= 36% limitation   Time 8   Period Weeks   Status New   Target Date 08/11/17               Plan - 06/28/17 1031    Clinical Impression Statement less ttp to the masseter and pterygoid muscles today and overall UT/cspine.  Pt also reporting less tension.  She is becoming more independent with self care for tension during stress.  Had some sternal region pain with the doorway stretch she thinks due to an old rib injury but lasting about 32min.     PT Frequency 2x / week   PT Duration 8 weeks   PT Treatment/Interventions Electrical Stimulation;Moist Heat;Traction;Ultrasound;Patient/family education;Neuromuscular re-education;Therapeutic exercise;Therapeutic activities;Manual techniques;Passive range of motion;Dry needling;Energy conservation;Taping   PT Next Visit Plan continues needling, STM, postural strength, modalities PRN,    (pt requested upon leaving if we could incorporate some internal TMJ muscular release as she had success with this in the past)   PT Home Exercise Plan progress as needed      Patient will benefit from skilled therapeutic  intervention in order to improve the following deficits and impairments:  Decreased range of motion, Increased fascial restricitons, Increased muscle spasms, Decreased endurance, Decreased activity tolerance, Pain, Impaired flexibility, Improper body mechanics,  Decreased strength, Decreased mobility  Visit Diagnosis: Muscle weakness (generalized)  Cervicalgia  Other muscle spasm  Abnormal posture     Problem List Patient Active Problem List   Diagnosis Date Noted  . Prediabetes 04/06/2017  . Osteoporosis 06/12/2015  . IBS (irritable bowel syndrome) 08/21/2014  . Hyperlipidemia 08/05/2011  . ABDOMINAL PAIN RIGHT UPPER QUADRANT 10/30/2008  . ABDOMINAL BLOATING 10/25/2008  . GERD 06/04/2008  . DIVERTICULOSIS OF COLON 06/04/2008    Stark Bray, DPT, CMP 06/28/2017, 10:59 AM  [facility] [address], [place], [place], [address] Phone: [phone]   Fax:  [phone]  Name: EULANDA DORION MRN: [id number] Date of Birth: 10-05-1946

## 2017-06-30 NOTE — Telephone Encounter (Signed)
Ok to call and schedule

## 2017-06-30 NOTE — Telephone Encounter (Signed)
Pt has been sch for 07-23-17

## 2017-07-01 ENCOUNTER — Ambulatory Visit: Payer: Medicare Other | Admitting: Physical Therapy

## 2017-07-01 DIAGNOSIS — M6281 Muscle weakness (generalized): Secondary | ICD-10-CM | POA: Diagnosis not present

## 2017-07-01 DIAGNOSIS — M62838 Other muscle spasm: Secondary | ICD-10-CM | POA: Diagnosis not present

## 2017-07-01 DIAGNOSIS — M542 Cervicalgia: Secondary | ICD-10-CM | POA: Diagnosis not present

## 2017-07-01 DIAGNOSIS — R293 Abnormal posture: Secondary | ICD-10-CM | POA: Diagnosis not present

## 2017-07-01 NOTE — Patient Instructions (Addendum)
   RE-ALIGNMENT ROUTINE EXERCISES BASIC FOR POSTURAL CORRECTION   RE-ALIGNMENT Tips BENEFITS: 1.It helps to re-align the curves of the back and improve standing posture. 2.It allows the back muscles to rest and strengthen in preparation for more activity. FREQUENCY: Daily, even after weeks, months and years of more advanced exercises. START: 1.All exercises start in the same position: lying on the back, arms resting on the supporting surface, palms up and slightly away from the body, backs of hands down, knees bent, feet flat. 2.The head, neck, arms, and legs are supported according to specific instructions of your therapist. Copyright  VHI. All rights reserved.    1. Decompression Exercise: Basic.   Takes compression off the vertebral bodies; increases tolerance for lying on the back; helps relieve back pain   Lie on back on firm surface, knees bent, feet flat, arms turned up, out to sides (~35 degrees). Head neck and arms supported as necessary. Time _5-10_ minutes. Surface: floor     2. Shoulder Press  Strengthens upper back extensors and scapular retractors.   Press both shoulders down. Hold _2-3__ seconds. Repeat _3-5__ times. Surface: floor        3. Head Press With Clarence  Strengthens neck extensors   Tuck chin SLIGHTLY toward chest, keep mouth closed. Feel weight on back of head. Increase weight by pressing head down. Hold _2-3__ seconds. Relax. Repeat 3-5___ times. Surface: floor   4. Leg Lengthener: "make your leg grow longer"   5.  Whole leg press down:  "press down into the sand"   5x 5 sec hold     Over Head Pull: Narrow Grip       On back, knees bent, feet flat, band across thighs, elbows straight but relaxed. Pull hands apart (start). Keeping elbows straight, bring arms up and over head, hands toward floor. Keep pull steady on band. Hold momentarily. Return slowly, keeping pull steady, back to start. Repeat __6_ times. Band color ___red___    Side Pull: Double Arm   On back, knees bent, feet flat. Arms perpendicular to body, shoulder level, elbows straight but relaxed. Pull arms out to sides, elbows straight. Resistance band comes across collarbones, hands toward floor. Hold momentarily. Slowly return to starting position. Repeat _6__ times. Band color _red____   Elmer Picker   On back, knees bent, feet flat, left hand on left hip, right hand above left. Pull right arm DIAGONALLY (hip to shoulder) across chest. Bring right arm along head toward floor. Hold momentarily. Slowly return to starting position. Repeat __6_ times. Do with left arm. Band color ___red___   Shoulder Rotation: Double Arm   On back, knees bent, feet flat, elbows tucked at sides, bent 90, hands palms up. Pull hands apart and down toward floor, keeping elbows near sides. Hold momentarily. Slowly return to starting position. Repeat _6__ times. Band color ___red___   [facility] [address] [place], [place] [address] Phone # [phone] Fax [phone]

## 2017-07-01 NOTE — Therapy (Signed)
[facility] [address] [place], [place], [address] Phone: [phone]   Fax:  [phone]  Physical Therapy Treatment  Patient Details  Name: Shelly [name] MRN: [id number] Date of Birth: February 24, 1946 Referring Provider: Dr. Carolann Littler  Encounter Date: 07/01/2017      PT End of Session - 07/01/17 0916    Visit Number 5   Number of Visits 10   Date for PT Re-Evaluation 08/11/17   Authorization Type medicare g-code 10th visit; kx modifier for 15th visit   PT Start Time 0845   PT Stop Time 0925   PT Time Calculation (min) 40 min   Activity Tolerance Patient tolerated treatment well      Past Medical History:  Diagnosis Date  . Arthritis    wrist, shoulder   . Diverticulitis   . Endometrial polyp   . GERD (gastroesophageal reflux disease)    occasional - diet controlled  . Hx of adenomatous colonic polyps   . Hyperlipidemia   . IBS (irritable bowel syndrome)   . Missed abortion    x 1 - resolved - no surgery  . Osteoporosis   . SVD (spontaneous vaginal delivery)    x 1  . Vitamin D deficiency     Past Surgical History:  Procedure Laterality Date  . COLONOSCOPY    . DIAGNOSTIC LAPAROSCOPY     cysts  . DILATATION & CURETTAGE/HYSTEROSCOPY WITH TRUECLEAR N/A 10/31/2013   Procedure: DILATATION & CURETTAGE/HYSTEROSCOPY WITH TRUCLEAR;  Surgeon: Marylynn Pearson, MD;  Location: [facility];  Service: Gynecology;  Laterality: N/A;  . DILATION AND CURETTAGE OF UTERUS    . TONSILLECTOMY  1952  . WISDOM TOOTH EXTRACTION      There were no vitals filed for this visit.      Subjective Assessment - 07/01/17 0848    Subjective My jaws are feeling better.  I get stressed and that makes me clench and my shoulders tighten up.     Currently in Pain? No/denies   Pain Score 0-No pain                         [facility] Adult PT Treatment/Exercise - 07/01/17 0001      Therapeutic Activites     Therapeutic Activities ADL's     Neuro Re-ed    Neuro Re-ed Details  neuroscience of pain education;  jaw muscle relaxation strategies     Neck Exercises: Supine   Other Supine Exercise decompression series 5 reps per HEP   Other Supine Exercise red band scapular band series:  overhead, horizontal abduction, external rotation, sash 10x each                PT Education - 07/01/17 0903    Education provided Yes   Education Details decompression series;  supine scapular red band    Person(s) Educated Patient   Methods Explanation;Demonstration;Handout   Comprehension Verbalized understanding;Returned demonstration          PT Short Term Goals - 07/01/17 2128      PT SHORT TERM GOAL #1   Title independent with initial HEP   Time 4   Period Weeks   Status On-going     PT SHORT TERM GOAL #2   Title pain with daily activities is moderate due to improved awareness of posture   Time 4   Period Weeks   Status On-going     PT SHORT TERM GOAL #3  Title wake up with moderate pain due to improved sleeping posture and trigger points in neck and Jaw   Time 4   Period Weeks   Status On-going     PT SHORT TERM GOAL #4   Title understand postures not to do with osteoporosis and what osteoporosis is about   Period Weeks   Status On-going           PT Long Term Goals - 07/01/17 2130      PT LONG TERM GOAL #1   Title Independent with HEP   Time 8   Period Weeks   Status On-going     PT LONG TERM GOAL #2   Title understand correct body mechanics to decrease strain on neck and upper back    Time 8   Period Weeks   Status On-going     PT LONG TERM GOAL #3   Title wake up in the morning with minimal pain due to understanding correct sleeping position and improve tissue mobility   Time 8   Period Weeks   Status On-going     PT LONG TERM GOAL #4   Title Bilateral TMJ pain decreased to minimal due to improve mobility and reduction in trigger points so headaches  are decreased by 50%   Time 8   Period Weeks   Status On-going     PT LONG TERM GOAL #5   Title upper back pain decreased to minimal with daily activities due to improved postural strength and reduction of muscle tightness   Time 8   Period Weeks   Status On-going     PT LONG TERM GOAL #6   Title FOTO score </= 36% limitation   Time 8   Period Weeks   Status On-going               Plan - 07/01/17 0916    Clinical Impression Statement The patient reports 2 good days of feeling good with less jaw pain.  Extensive discussion on relaxation strategies and neuroscience of pain education.  Patient receptive to education and ready to implement positive changes.  She declines the need for manual therapy or modalities for pain management today.     Rehab Potential Excellent   Clinical Impairments Affecting Rehab Potential osteoporosis   PT Frequency 2x / week   PT Duration 8 weeks   PT Next Visit Plan as needed needling, STM, postural strength, modalities PRN,   internal TMJ muscular release as needed;  assess response to red band and add standing rows and extensions      Patient will benefit from skilled therapeutic intervention in order to improve the following deficits and impairments:  Decreased range of motion, Increased fascial restricitons, Increased muscle spasms, Decreased endurance, Decreased activity tolerance, Pain, Impaired flexibility, Improper body mechanics, Decreased strength, Decreased mobility  Visit Diagnosis: Muscle weakness (generalized)  Cervicalgia  Other muscle spasm  Abnormal posture     Problem List Patient Active Problem List   Diagnosis Date Noted  . Prediabetes 04/06/2017  . Osteoporosis 06/12/2015  . IBS (irritable bowel syndrome) 08/21/2014  . Hyperlipidemia 08/05/2011  . ABDOMINAL PAIN RIGHT UPPER QUADRANT 10/30/2008  . ABDOMINAL BLOATING 10/25/2008  . GERD 06/04/2008  . DIVERTICULOSIS OF COLON 06/04/2008   Ruben Im,  PT 07/01/17 9:33 PM Phone: [phone] Fax: [phone]  Alvera Singh 07/01/2017, 9:32 PM  [facility] [address] [place], [place], [address] Phone: [phone]   Fax:  [phone]  Name: Shelly [name] MRN: [id number] Date of Birth: 15-Mar-1946

## 2017-07-05 ENCOUNTER — Encounter: Payer: Self-pay | Admitting: Rehabilitation

## 2017-07-05 ENCOUNTER — Ambulatory Visit: Payer: Medicare Other | Admitting: Rehabilitation

## 2017-07-05 DIAGNOSIS — M6281 Muscle weakness (generalized): Secondary | ICD-10-CM

## 2017-07-05 DIAGNOSIS — M542 Cervicalgia: Secondary | ICD-10-CM | POA: Diagnosis not present

## 2017-07-05 DIAGNOSIS — M62838 Other muscle spasm: Secondary | ICD-10-CM

## 2017-07-05 DIAGNOSIS — R293 Abnormal posture: Secondary | ICD-10-CM | POA: Diagnosis not present

## 2017-07-05 NOTE — Therapy (Signed)
[facility] [address] [place], [place], [address] Phone: [phone]   Fax:  [phone]  Physical Therapy Treatment  Patient Details  Name: Shelly [name] MRN: [id number] Date of Birth: 1946/11/09 Referring Provider: Dr. Carolann Littler  Encounter Date: 07/05/2017      PT End of Session - 07/05/17 1032    Visit Number 6   Number of Visits 10   Date for PT Re-Evaluation 08/11/17   Authorization Type medicare g-code 10th visit; kx modifier for 15th visit   PT Start Time 1015   PT Stop Time 1110   PT Time Calculation (min) 55 min   Activity Tolerance Patient tolerated treatment well      Past Medical History:  Diagnosis Date  . Arthritis    wrist, shoulder   . Diverticulitis   . Endometrial polyp   . GERD (gastroesophageal reflux disease)    occasional - diet controlled  . Hx of adenomatous colonic polyps   . Hyperlipidemia   . IBS (irritable bowel syndrome)   . Missed abortion    x 1 - resolved - no surgery  . Osteoporosis   . SVD (spontaneous vaginal delivery)    x 1  . Vitamin D deficiency     Past Surgical History:  Procedure Laterality Date  . COLONOSCOPY    . DIAGNOSTIC LAPAROSCOPY     cysts  . DILATATION & CURETTAGE/HYSTEROSCOPY WITH TRUECLEAR N/A 10/31/2013   Procedure: DILATATION & CURETTAGE/HYSTEROSCOPY WITH TRUCLEAR;  Surgeon: Marylynn Pearson, MD;  Location: [facility];  Service: Gynecology;  Laterality: N/A;  . DILATION AND CURETTAGE OF UTERUS    . TONSILLECTOMY  1952  . WISDOM TOOTH EXTRACTION      There were no vitals filed for this visit.      Subjective Assessment - 07/05/17 1009    Subjective doing well today.  Some increased shoulder and upper back tension due to starting the resistance band exercises.  Getting on the floor has been hard so was wondering if she could do the same exercsies standing.     Pain Score 3    Pain Location Shoulder   Pain Orientation Right;Left    Pain Descriptors / Indicators Aching                         [facility] Adult PT Treatment/Exercise - 07/05/17 0001      Neck Exercises: Supine   Other Supine Exercise decompression series 5 reps per HEP     Shoulder Exercises: Standing   Row 15 reps;Theraband   Row Weight (lbs) red   Other Standing Exercises standing at wall with purple small roll behind the thoracic spine red band bil horizontal abduction, flexion with sustained abduction, and Sash x 15 each bil     Shoulder Exercises: Stretch   Other Shoulder Stretches seated UT and deltoid stretch bil 2x30"     Moist Heat Therapy   Number Minutes Moist Heat 15 Minutes   Moist Heat Location Cervical                  PT Short Term Goals - 07/01/17 2128      PT SHORT TERM GOAL #1   Title independent with initial HEP   Time 4   Period Weeks   Status On-going     PT SHORT TERM GOAL #2   Title pain with daily activities is moderate due to improved awareness of posture  Time 4   Period Weeks   Status On-going     PT SHORT TERM GOAL #3   Title wake up with moderate pain due to improved sleeping posture and trigger points in neck and Jaw   Time 4   Period Weeks   Status On-going     PT SHORT TERM GOAL #4   Title understand postures not to do with osteoporosis and what osteoporosis is about   Period Weeks   Status On-going           PT Long Term Goals - 07/01/17 2130      PT LONG TERM GOAL #1   Title Independent with HEP   Time 8   Period Weeks   Status On-going     PT LONG TERM GOAL #2   Title understand correct body mechanics to decrease strain on neck and upper back    Time 8   Period Weeks   Status On-going     PT LONG TERM GOAL #3   Title wake up in the morning with minimal pain due to understanding correct sleeping position and improve tissue mobility   Time 8   Period Weeks   Status On-going     PT LONG TERM GOAL #4   Title Bilateral TMJ pain decreased to minimal due  to improve mobility and reduction in trigger points so headaches are decreased by 50%   Time 8   Period Weeks   Status On-going     PT LONG TERM GOAL #5   Title upper back pain decreased to minimal with daily activities due to improved postural strength and reduction of muscle tightness   Time 8   Period Weeks   Status On-going     PT LONG TERM GOAL #6   Title FOTO score </= 36% limitation   Time 8   Period Weeks   Status On-going               Plan - 07/05/17 1033    Clinical Impression Statement Pt has been having less pain lately only soreness from new exercises.  No jaw pain lately.  She was surprised about how out of shape the arms and neck muscles are with starting her red band series.  Feels more comfortable doing this standing but is able to tolerate decompression series supine.     PT Treatment/Interventions Electrical Stimulation;Moist Heat;Traction;Ultrasound;Patient/family education;Neuromuscular re-education;Therapeutic exercise;Therapeutic activities;Manual techniques;Passive range of motion;Dry needling;Energy conservation;Taping   PT Next Visit Plan as needed needling, STM, postural strength, modalities PRN,   internal TMJ muscular release as needed;     PT Home Exercise Plan progress as needed      Patient will benefit from skilled therapeutic intervention in order to improve the following deficits and impairments:  Decreased range of motion, Increased fascial restricitons, Increased muscle spasms, Decreased endurance, Decreased activity tolerance, Pain, Impaired flexibility, Improper body mechanics, Decreased strength, Decreased mobility  Visit Diagnosis: Muscle weakness (generalized)  Cervicalgia  Other muscle spasm  Abnormal posture     Problem List Patient Active Problem List   Diagnosis Date Noted  . Prediabetes 04/06/2017  . Osteoporosis 06/12/2015  . IBS (irritable bowel syndrome) 08/21/2014  . Hyperlipidemia 08/05/2011  . ABDOMINAL PAIN  RIGHT UPPER QUADRANT 10/30/2008  . ABDOMINAL BLOATING 10/25/2008  . GERD 06/04/2008  . DIVERTICULOSIS OF COLON 06/04/2008    Stark Bray, DPT, CMP 07/05/2017, 10:57 AM  [facility] [address], [place], [place],  [address] Phone: [phone]   Fax:  [phone]  Name: Shelly [name] MRN: [id number] Date of Birth: 12/19/1945

## 2017-07-08 ENCOUNTER — Encounter: Payer: Self-pay | Admitting: Physical Therapy

## 2017-07-08 ENCOUNTER — Ambulatory Visit: Payer: Medicare Other | Admitting: Physical Therapy

## 2017-07-08 DIAGNOSIS — M62838 Other muscle spasm: Secondary | ICD-10-CM

## 2017-07-08 DIAGNOSIS — M542 Cervicalgia: Secondary | ICD-10-CM | POA: Diagnosis not present

## 2017-07-08 DIAGNOSIS — M6281 Muscle weakness (generalized): Secondary | ICD-10-CM | POA: Diagnosis not present

## 2017-07-08 DIAGNOSIS — R293 Abnormal posture: Secondary | ICD-10-CM

## 2017-07-08 NOTE — Therapy (Signed)
[facility] [address], [place], [place], [address] Phone: [phone]   Fax:  [phone]  Physical Therapy Treatment  Patient Details  Name: Shelly [name] MRN: [id number] Date of Birth: 13-Nov-1946 Referring Provider: Dr. Carolann Littler  Encounter Date: 07/08/2017      PT End of Session - 07/08/17 1021    Visit Number 7   Number of Visits 10   Date for PT Re-Evaluation 08/11/17   Authorization Type medicare g-code 10th visit; kx modifier for 15th visit   PT Start Time 1021   PT Stop Time 1102   PT Time Calculation (min) 41 min   Activity Tolerance Patient tolerated treatment well   Behavior During Therapy [facility] for tasks assessed/performed      Past Medical History:  Diagnosis Date  . Arthritis    wrist, shoulder   . Diverticulitis   . Endometrial polyp   . GERD (gastroesophageal reflux disease)    occasional - diet controlled  . Hx of adenomatous colonic polyps   . Hyperlipidemia   . IBS (irritable bowel syndrome)   . Missed abortion    x 1 - resolved - no surgery  . Osteoporosis   . SVD (spontaneous vaginal delivery)    x 1  . Vitamin D deficiency     Past Surgical History:  Procedure Laterality Date  . COLONOSCOPY    . DIAGNOSTIC LAPAROSCOPY     cysts  . DILATATION & CURETTAGE/HYSTEROSCOPY WITH TRUECLEAR N/A 10/31/2013   Procedure: DILATATION & CURETTAGE/HYSTEROSCOPY WITH TRUCLEAR;  Surgeon: Marylynn Pearson, MD;  Location: [facility];  Service: Gynecology;  Laterality: N/A;  . DILATION AND CURETTAGE OF UTERUS    . TONSILLECTOMY  1952  . WISDOM TOOTH EXTRACTION      There were no vitals filed for this visit.      Subjective Assessment - 07/08/17 1023    Subjective I am not having any pain today.  My biggest problem is getting on the floor.  I am wearing the night guard again and exercising.     Patient Stated Goals find exercise in the morning to help pain and relax the muscles    Currently in Pain? No/denies                         [facility] Adult PT Treatment/Exercise - 07/08/17 0001      Neck Exercises: Standing   Other Standing Exercises standing with foam roll behind back - horizontal abduction, ER, scap squeezes, cervical retraction - 20x each  thoracic and cervical extension during band exercises     Neck Exercises: Supine   Other Supine Exercise red banc diagonals     Knee/Hip Exercises: Supine   Bridges Strengthening;Both;10 reps  cues to press into heel for glutes     Shoulder Exercises: Supine   Other Supine Exercises shoulder flex/ext with red ball - cues to engaged abdominals - 15x     Shoulder Exercises: ROM/Strengthening   UBE (Upper Arm Bike) L0 x 6 min  cues for posture, seated on green ball                  PT Short Term Goals - 07/08/17 1037      PT SHORT TERM GOAL #1   Title independent with initial HEP   Time 4   Period Weeks   Status Achieved     PT SHORT TERM GOAL #2   Title pain with  daily activities is moderate due to improved awareness of posture   Time 4   Period Weeks   Status Achieved     PT SHORT TERM GOAL #3   Title wake up with moderate pain due to improved sleeping posture and trigger points in neck and Jaw   Baseline waking up without as much pain   Time 4   Period Weeks   Status On-going     PT SHORT TERM GOAL #4   Title understand postures not to do with osteoporosis and what osteoporosis is about   Time 4   Period Weeks   Status On-going           PT Long Term Goals - 07/01/17 2130      PT LONG TERM GOAL #1   Title Independent with HEP   Time 8   Period Weeks   Status On-going     PT LONG TERM GOAL #2   Title understand correct body mechanics to decrease strain on neck and upper back    Time 8   Period Weeks   Status On-going     PT LONG TERM GOAL #3   Title wake up in the morning with minimal pain due to understanding correct sleeping position and improve tissue  mobility   Time 8   Period Weeks   Status On-going     PT LONG TERM GOAL #4   Title Bilateral TMJ pain decreased to minimal due to improve mobility and reduction in trigger points so headaches are decreased by 50%   Time 8   Period Weeks   Status On-going     PT LONG TERM GOAL #5   Title upper back pain decreased to minimal with daily activities due to improved postural strength and reduction of muscle tightness   Time 8   Period Weeks   Status On-going     PT LONG TERM GOAL #6   Title FOTO score </= 36% limitation   Time 8   Period Weeks   Status On-going               Plan - 07/08/17 1021    Clinical Impression Statement Patient has made progress with short term goals and is able to do exercises at home.  She is not having as much of a HA when waking up.  Pt needs cues for posutre throughout exercises.  Skilled PT needed for improved postural strength and reduced muscle spasms.   PT Treatment/Interventions Electrical Stimulation;Moist Heat;Traction;Ultrasound;Patient/family education;Neuromuscular re-education;Therapeutic exercise;Therapeutic activities;Manual techniques;Passive range of motion;Dry needling;Energy conservation;Taping   PT Next Visit Plan as needed needling, STM, postural strength, modalities PRN,   internal TMJ muscular release as needed;     PT Home Exercise Plan progress as needed   Consulted and Agree with Plan of Care Patient      Patient will benefit from skilled therapeutic intervention in order to improve the following deficits and impairments:  Decreased range of motion, Increased fascial restricitons, Increased muscle spasms, Decreased endurance, Decreased activity tolerance, Pain, Impaired flexibility, Improper body mechanics, Decreased strength, Decreased mobility  Visit Diagnosis: Muscle weakness (generalized)  Cervicalgia  Other muscle spasm  Abnormal posture     Problem List Patient Active Problem List   Diagnosis Date Noted   . Prediabetes 04/06/2017  . Osteoporosis 06/12/2015  . IBS (irritable bowel syndrome) 08/21/2014  . Hyperlipidemia 08/05/2011  . ABDOMINAL PAIN RIGHT UPPER QUADRANT 10/30/2008  . ABDOMINAL BLOATING 10/25/2008  . GERD 06/04/2008  . DIVERTICULOSIS  OF COLON 06/04/2008    Zannie Cove, PT 07/08/2017, 11:03 AM  [facility] [address] [place], [place], [address] Phone: [phone]   Fax:  [phone]  Name: LATRICA CLOWERS MRN: [id number] Date of Birth: 09/05/1946

## 2017-07-09 ENCOUNTER — Encounter: Payer: Medicare Other | Admitting: Physical Therapy

## 2017-07-12 ENCOUNTER — Ambulatory Visit: Payer: Medicare Other | Admitting: Rehabilitation

## 2017-07-12 DIAGNOSIS — M542 Cervicalgia: Secondary | ICD-10-CM

## 2017-07-12 DIAGNOSIS — M6281 Muscle weakness (generalized): Secondary | ICD-10-CM

## 2017-07-12 DIAGNOSIS — R293 Abnormal posture: Secondary | ICD-10-CM

## 2017-07-12 DIAGNOSIS — M62838 Other muscle spasm: Secondary | ICD-10-CM

## 2017-07-12 NOTE — Therapy (Signed)
[facility] [address] [place], [place], [address] Phone: [phone]   Fax:  [phone]  Physical Therapy Treatment  Patient Details  Name: Shelly [name] MRN: [id number] Date of Birth: December 07, 1945 Referring Provider: Dr. Carolann Littler  Encounter Date: 07/12/2017      PT End of Session - 07/12/17 1044    Visit Number 8   Number of Visits 10   Date for PT Re-Evaluation 08/11/17   Authorization Type medicare g-code 10th visit; kx modifier for 15th visit   PT Start Time 1015   PT Stop Time 1054   PT Time Calculation (min) 39 min   Activity Tolerance Patient tolerated treatment well      Past Medical History:  Diagnosis Date  . Arthritis    wrist, shoulder   . Diverticulitis   . Endometrial polyp   . GERD (gastroesophageal reflux disease)    occasional - diet controlled  . Hx of adenomatous colonic polyps   . Hyperlipidemia   . IBS (irritable bowel syndrome)   . Missed abortion    x 1 - resolved - no surgery  . Osteoporosis   . SVD (spontaneous vaginal delivery)    x 1  . Vitamin D deficiency     Past Surgical History:  Procedure Laterality Date  . COLONOSCOPY    . DIAGNOSTIC LAPAROSCOPY     cysts  . DILATATION & CURETTAGE/HYSTEROSCOPY WITH TRUECLEAR N/A 10/31/2013   Procedure: DILATATION & CURETTAGE/HYSTEROSCOPY WITH TRUCLEAR;  Surgeon: Marylynn Pearson, MD;  Location: [facility];  Service: Gynecology;  Laterality: N/A;  . DILATION AND CURETTAGE OF UTERUS    . TONSILLECTOMY  1952  . WISDOM TOOTH EXTRACTION      There were no vitals filed for this visit.      Subjective Assessment - 07/12/17 1017    Subjective Still doing well.  No new complaints   Currently in Pain? No/denies                         [facility] Adult PT Treatment/Exercise - 07/12/17 0001      Neck Exercises: Machines for Strengthening   UBE (Upper Arm Bike) level 1 57mn fwd/2 min backward     Neck  Exercises: Standing   Other Standing Exercises standing with foam roll behind back - horizontal abduction, ER, scap squeezes, cervical retraction - 20x each     Neck Exercises: Seated   Neck Retraction 20 reps;3 secs   Neck Retraction Limitations with cueing still needed     Neck Exercises: Supine   Other Supine Exercise red band horizontal abd x 15     Knee/Hip Exercises: Supine   Bridges Strengthening;Both;10 reps  weight through heels and articulating liftlower                  PT Short Term Goals - 07/08/17 1037      PT SHORT TERM GOAL #1   Title independent with initial HEP   Time 4   Period Weeks   Status Achieved     PT SHORT TERM GOAL #2   Title pain with daily activities is moderate due to improved awareness of posture   Time 4   Period Weeks   Status Achieved     PT SHORT TERM GOAL #3   Title wake up with moderate pain due to improved sleeping posture and trigger points in neck and Jaw   Baseline waking up without as  much pain   Time 4   Period Weeks   Status On-going     PT SHORT TERM GOAL #4   Title understand postures not to do with osteoporosis and what osteoporosis is about   Time 4   Period Weeks   Status On-going           PT Long Term Goals - 07/12/17 1044      PT LONG TERM GOAL #1   Title Independent with HEP   Status On-going     PT LONG TERM GOAL #2   Title understand correct body mechanics to decrease strain on neck and upper back    Status On-going     PT LONG TERM GOAL #3   Title wake up in the morning with minimal pain due to understanding correct sleeping position and improve tissue mobility   Status On-going     PT LONG TERM GOAL #4   Title Bilateral TMJ pain decreased to minimal due to improve mobility and reduction in trigger points so headaches are decreased by 50%   Status Achieved     PT LONG TERM GOAL #5   Title upper back pain decreased to minimal with daily activities due to improved postural strength and  reduction of muscle tightness   Status On-going     PT LONG TERM GOAL #6   Title FOTO score </= 36% limitation   Status On-going               Plan - 07/12/17 1045    Clinical Impression Statement LTG # 4 met today with progress being made on all goals overall.  Still without need for STM and modalties today.  Cueing still needed for most exercises  Pt reports she will most likely be ready for DC after the last scheduled visits   PT Frequency 2x / week   PT Duration 8 weeks   PT Treatment/Interventions Electrical Stimulation;Moist Heat;Traction;Ultrasound;Patient/family education;Neuromuscular re-education;Therapeutic exercise;Therapeutic activities;Manual techniques;Passive range of motion;Dry needling;Energy conservation;Taping   PT Next Visit Plan as needed needling, STM, postural strength, modalities PRN,   internal TMJ muscular release as needed;        Patient will benefit from skilled therapeutic intervention in order to improve the following deficits and impairments:  Decreased range of motion, Increased fascial restricitons, Increased muscle spasms, Decreased endurance, Decreased activity tolerance, Pain, Impaired flexibility, Improper body mechanics, Decreased strength, Decreased mobility  Visit Diagnosis: Muscle weakness (generalized)  Cervicalgia  Other muscle spasm  Abnormal posture     Problem List Patient Active Problem List   Diagnosis Date Noted  . Prediabetes 04/06/2017  . Osteoporosis 06/12/2015  . IBS (irritable bowel syndrome) 08/21/2014  . Hyperlipidemia 08/05/2011  . ABDOMINAL PAIN RIGHT UPPER QUADRANT 10/30/2008  . ABDOMINAL BLOATING 10/25/2008  . GERD 06/04/2008  . DIVERTICULOSIS OF COLON 06/04/2008    Stark Bray, DPT, CMP 07/12/2017, 10:55 AM  [facility] [address], [place], [place], [address] Phone: [phone]   Fax:  [phone]  Name: DANITY SCHMELZER MRN:  [id number] Date of Birth: 1946-08-05

## 2017-07-16 ENCOUNTER — Ambulatory Visit: Payer: Medicare Other | Admitting: Physical Therapy

## 2017-07-16 DIAGNOSIS — R293 Abnormal posture: Secondary | ICD-10-CM | POA: Diagnosis not present

## 2017-07-16 DIAGNOSIS — M62838 Other muscle spasm: Secondary | ICD-10-CM

## 2017-07-16 DIAGNOSIS — M6281 Muscle weakness (generalized): Secondary | ICD-10-CM

## 2017-07-16 DIAGNOSIS — M542 Cervicalgia: Secondary | ICD-10-CM

## 2017-07-16 NOTE — Therapy (Signed)
[facility] [address], [place], [place], [address] Phone: [phone]   Fax:  [phone]  Physical Therapy Treatment  Patient Details  Name: Shelly [name] MRN: [id number] Date of Birth: 10-Nov-1946 Referring Provider: Dr. Carolann Littler  Encounter Date: 07/16/2017      PT End of Session - 07/16/17 0949    Visit Number 9   Number of Visits 10   Date for PT Re-Evaluation 08/11/17   Authorization Type medicare g-code 10th visit; kx modifier for 15th visit   PT Start Time 0933   PT Stop Time 1014   PT Time Calculation (min) 41 min   Activity Tolerance Patient tolerated treatment well      Past Medical History:  Diagnosis Date  . Arthritis    wrist, shoulder   . Diverticulitis   . Endometrial polyp   . GERD (gastroesophageal reflux disease)    occasional - diet controlled  . Hx of adenomatous colonic polyps   . Hyperlipidemia   . IBS (irritable bowel syndrome)   . Missed abortion    x 1 - resolved - no surgery  . Osteoporosis   . SVD (spontaneous vaginal delivery)    x 1  . Vitamin D deficiency     Past Surgical History:  Procedure Laterality Date  . COLONOSCOPY    . DIAGNOSTIC LAPAROSCOPY     cysts  . DILATATION & CURETTAGE/HYSTEROSCOPY WITH TRUECLEAR N/A 10/31/2013   Procedure: DILATATION & CURETTAGE/HYSTEROSCOPY WITH TRUCLEAR;  Surgeon: Marylynn Pearson, MD;  Location: [facility];  Service: Gynecology;  Laterality: N/A;  . DILATION AND CURETTAGE OF UTERUS    . TONSILLECTOMY  1952  . WISDOM TOOTH EXTRACTION      There were no vitals filed for this visit.      Subjective Assessment - 07/16/17 0935    Subjective Went shopping yesterday and I wore the wrong shoes.  My lower back is kind of sore.  I'm doing better.  My jaw did get sore after eating crunchy cereal.     Patient Stated Goals strengthen my upper body;  relax more so I don't have pain in my jaw   Currently in Pain? Yes   Pain Score  2    Pain Location Back   Pain Orientation Lower   Pain Type Chronic pain   Aggravating Factors  wearing wrong shoes; eating crunchy cereal hurts jaw; stress                         [facility] Adult PT Treatment/Exercise - 07/16/17 0001      Therapeutic Activites    Therapeutic Activities ADL's   ADL's sitting, walking, standing     Neuro Re-ed    Neuro Re-ed Details  postural education related to osteoporosis     Neck Exercises: Standing   Other Standing Exercises standing with foam roll behind back - horizontal abduction, ER, scap squeezes, cervical retraction - 20x each     Neck Exercises: Supine   Other Supine Exercise decompression series 5 reps per HEP   Other Supine Exercise red band scapular:  narrow and wide grip overhead, horizontal abduction; sash, external rotation 10x each                  PT Short Term Goals - 07/16/17 0867      PT SHORT TERM GOAL #1   Title independent with initial HEP   Status Achieved  PT SHORT TERM GOAL #2   Title pain with daily activities is moderate due to improved awareness of posture   Status Achieved     PT SHORT TERM GOAL #3   Title wake up with moderate pain due to improved sleeping posture and trigger points in neck and Jaw   Status Achieved     PT SHORT TERM GOAL #4   Title understand postures not to do with osteoporosis and what osteoporosis is about   Status Achieved           PT Long Term Goals - 07/16/17 0954      PT LONG TERM GOAL #1   Title Independent with HEP   Time 8   Period Weeks   Status On-going     PT LONG TERM GOAL #2   Title understand correct body mechanics to decrease strain on neck and upper back    Period Weeks   Status On-going     PT LONG TERM GOAL #3   Title wake up in the morning with minimal pain due to understanding correct sleeping position and improve tissue mobility   Time 8   Period Weeks   Status On-going     PT LONG TERM GOAL #4   Title Bilateral  TMJ pain decreased to minimal due to improve mobility and reduction in trigger points so headaches are decreased by 50%   Status Achieved     PT LONG TERM GOAL #5   Title upper back pain decreased to minimal with daily activities due to improved postural strength and reduction of muscle tightness   Time 8   Period Weeks   Status On-going     PT LONG TERM GOAL #6   Title FOTO score </= 36% limitation   Time 8   Period Weeks   Status On-going               Plan - 07/16/17 0950    Clinical Impression Statement The patient is progressing with pain reduction in neck and jaw although she did have a set back after eating crunchy cereal and shopping yesterday in sandals rather than her sneakers.  She declines the need for modalities.  All short term goals met.  Should meet remaining goals in 2-3 visits.     Rehab Potential Excellent   Clinical Impairments Affecting Rehab Potential osteoporosis   PT Frequency 2x / week   PT Duration 8 weeks   PT Treatment/Interventions Electrical Stimulation;Moist Heat;Traction;Ultrasound;Patient/family education;Neuromuscular re-education;Therapeutic exercise;Therapeutic activities;Manual techniques;Passive range of motion;Dry needling;Energy conservation;Taping   PT Next Visit Plan DN next session;  STM, postural strength, modalities PRN,   internal TMJ muscular release as needed;        Patient will benefit from skilled therapeutic intervention in order to improve the following deficits and impairments:  Decreased range of motion, Increased fascial restricitons, Increased muscle spasms, Decreased endurance, Decreased activity tolerance, Pain, Impaired flexibility, Improper body mechanics, Decreased strength, Decreased mobility  Visit Diagnosis: Muscle weakness (generalized)  Cervicalgia  Other muscle spasm  Abnormal posture     Problem List Patient Active Problem List   Diagnosis Date Noted  . Prediabetes 04/06/2017  . Osteoporosis  06/12/2015  . IBS (irritable bowel syndrome) 08/21/2014  . Hyperlipidemia 08/05/2011  . ABDOMINAL PAIN RIGHT UPPER QUADRANT 10/30/2008  . ABDOMINAL BLOATING 10/25/2008  . GERD 06/04/2008  . DIVERTICULOSIS OF COLON 06/04/2008   Ruben Im, PT 07/16/17 10:13 AM Phone: [phone] Fax: [phone]  Alvera Singh 07/16/2017, 10:08 AM  [facility] [address] [place], [place], [address] Phone: [phone]   Fax:  [phone]  Name: ALKA FALWELL MRN: [id number] Date of Birth: [birth year]

## 2017-07-20 ENCOUNTER — Ambulatory Visit: Payer: Medicare Other | Attending: Family Medicine | Admitting: Physical Therapy

## 2017-07-20 ENCOUNTER — Encounter: Payer: Medicare Other | Admitting: Physical Therapy

## 2017-07-20 DIAGNOSIS — R293 Abnormal posture: Secondary | ICD-10-CM | POA: Insufficient documentation

## 2017-07-20 DIAGNOSIS — M62838 Other muscle spasm: Secondary | ICD-10-CM | POA: Insufficient documentation

## 2017-07-20 DIAGNOSIS — M6281 Muscle weakness (generalized): Secondary | ICD-10-CM | POA: Insufficient documentation

## 2017-07-20 DIAGNOSIS — M542 Cervicalgia: Secondary | ICD-10-CM

## 2017-07-20 NOTE — Therapy (Signed)
[facility] [address] [place], [place], [address] Phone: [phone]   Fax:  [phone]  Physical Therapy Treatment  Patient Details  Name: Shelly [name] MRN: [id number] Date of Birth: [birth year] Referring Provider: Dr. Carolann Littler  Encounter Date: 07/20/2017      PT End of Session - 07/20/17 1634    Visit Number 10   Number of Visits 20   Date for PT Re-Evaluation 08/11/17   Authorization Type medicare g-code 20th visit; kx modifier for 15th visit   PT Start Time 1610   PT Stop Time 1650   PT Time Calculation (min) 40 min   Activity Tolerance Patient tolerated treatment well      Past Medical History:  Diagnosis Date  . Arthritis    wrist, shoulder   . Diverticulitis   . Endometrial polyp   . GERD (gastroesophageal reflux disease)    occasional - diet controlled  . Hx of adenomatous colonic polyps   . Hyperlipidemia   . IBS (irritable bowel syndrome)   . Missed abortion    x 1 - resolved - no surgery  . Osteoporosis   . SVD (spontaneous vaginal delivery)    x 1  . Vitamin D deficiency     Past Surgical History:  Procedure Laterality Date  . COLONOSCOPY    . DIAGNOSTIC LAPAROSCOPY     cysts  . DILATATION & CURETTAGE/HYSTEROSCOPY WITH TRUECLEAR N/A 10/31/2013   Procedure: DILATATION & CURETTAGE/HYSTEROSCOPY WITH TRUCLEAR;  Surgeon: Marylynn Pearson, MD;  Location: [facility];  Service: Gynecology;  Laterality: N/A;  . DILATION AND CURETTAGE OF UTERUS    . TONSILLECTOMY  1952  . WISDOM TOOTH EXTRACTION      There were no vitals filed for this visit.      Subjective Assessment - 07/20/17 1607    Subjective I'm doing pretty good.  I had an upset stomach over the weekend.  I saw the dentist this morning and he gave me some suggestions for jaw.  He is going to look at my night guard.  I did my band exercises on the floor this morning.  Using the foam roll too.  I feel like I'm getting  stronger in my shoulders and chest.     Currently in Pain? No/denies   Pain Score 0-No pain            [facility] PT Assessment - 07/20/17 0001      Observation/Other Assessments   Focus on Therapeutic Outcomes (FOTO)  36% limitation                     [facility] Adult PT Treatment/Exercise - 07/20/17 0001      Therapeutic Activites    Therapeutic Activities ADL's   ADL's sitting, walking, standing     Neuro Re-ed    Neuro Re-ed Details  postural education related to osteoporosis     Neck Exercises: Standing   Wall Push Ups 10 reps   Other Standing Exercises standing with back to wall horrizontal abduction, ER, scap squeezes, cervical retraction - 20x each   Other Standing Exercises wall climbs 10x     Shoulder Exercises: Stretch   Other Shoulder Stretches seated upper trap stretch 3x 20 sec   Other Shoulder Stretches psoas doorway stretch     Moist Heat Therapy   Number Minutes Moist Heat 10 Minutes  with patient education during heat   Moist Heat Location Cervical     Manual  Therapy   Soft tissue mobilization left upper trap and levator scapula muscles          Trigger Point Dry Needling - 07/20/17 1634    Consent Given? Yes   Muscles Treated Upper Body Upper trapezius   Upper Trapezius Response Twitch reponse elicited;Palpable increased muscle length  left                PT Short Term Goals - 07/20/17 1655      PT SHORT TERM GOAL #1   Title independent with initial HEP   Status Achieved     PT SHORT TERM GOAL #2   Title pain with daily activities is moderate due to improved awareness of posture   Status Achieved     PT SHORT TERM GOAL #3   Title wake up with moderate pain due to improved sleeping posture and trigger points in neck and Jaw   Status Achieved     PT SHORT TERM GOAL #4   Title understand postures not to do with osteoporosis and what osteoporosis is about   Status Achieved           PT Long Term Goals - 07/20/17  1655      PT LONG TERM GOAL #1   Title Independent with HEP   Time 8   Period Weeks   Status On-going     PT LONG TERM GOAL #2   Title understand correct body mechanics to decrease strain on neck and upper back    Time 8   Period Weeks   Status On-going     PT LONG TERM GOAL #3   Title wake up in the morning with minimal pain due to understanding correct sleeping position and improve tissue mobility   Time 8   Period Weeks   Status On-going     PT LONG TERM GOAL #4   Title Bilateral TMJ pain decreased to minimal due to improve mobility and reduction in trigger points so headaches are decreased by 50%   Status Achieved     PT LONG TERM GOAL #5   Title upper back pain decreased to minimal with daily activities due to improved postural strength and reduction of muscle tightness   Time 8   Period Weeks   Status On-going     PT LONG TERM GOAL #6   Title FOTO score </= 36% limitation   Status Achieved               Plan - 07/20/17 1644    Clinical Impression Statement Good improvements in FOTO functional outcome score.  Decreased trigger point size and number.  Improved muscle length following dry needling and manual therapy.  Good carryover with HEP and osteoporosis  precautions.     Rehab Potential Excellent   Clinical Impairments Affecting Rehab Potential osteoporosis   PT Frequency 2x / week   PT Duration 8 weeks   PT Treatment/Interventions Electrical Stimulation;Moist Heat;Traction;Ultrasound;Patient/family education;Neuromuscular re-education;Therapeutic exercise;Therapeutic activities;Manual techniques;Passive range of motion;Dry needling;Energy conservation;Taping   PT Next Visit Plan STM, postural strength, modalities PRN, recheck cervical ROM; review progress toward goals for possible discharge next visit      Patient will benefit from skilled therapeutic intervention in order to improve the following deficits and impairments:  Decreased range of motion,  Increased fascial restricitons, Increased muscle spasms, Decreased endurance, Decreased activity tolerance, Pain, Impaired flexibility, Improper body mechanics, Decreased strength, Decreased mobility  Visit Diagnosis: Muscle weakness (generalized)  Cervicalgia  Other muscle spasm  Abnormal posture       G-Codes - 08/04/2017 1656    Functional Assessment Tool Used (Outpatient Only) FOTO    Functional Limitation Other PT primary   Other PT Primary Current Status (V5643) At least 20 percent but less than 40 percent impaired, limited or restricted   Other PT Primary Goal Status (P2951) At least 20 percent but less than 40 percent impaired, limited or restricted      Problem List Patient Active Problem List   Diagnosis Date Noted  . Prediabetes 04/06/2017  . Osteoporosis 06/12/2015  . IBS (irritable bowel syndrome) 08/21/2014  . Hyperlipidemia 08/05/2011  . ABDOMINAL PAIN RIGHT UPPER QUADRANT 10/30/2008  . ABDOMINAL BLOATING 10/25/2008  . GERD 06/04/2008  . DIVERTICULOSIS OF COLON 06/04/2008   Ruben Im, PT Aug 04, 2017 5:00 PM Phone: [phone] Fax: [phone]  Alvera Singh 2017/08/04, 4:59 PM  [facility] [address], [place], [place], [address] Phone: [phone]   Fax:  [phone]  Name: Shelly [name] MRN: [id number] Date of Birth: December 28, 1945

## 2017-07-23 ENCOUNTER — Ambulatory Visit: Payer: Medicare Other | Admitting: Physical Therapy

## 2017-07-23 ENCOUNTER — Ambulatory Visit (INDEPENDENT_AMBULATORY_CARE_PROVIDER_SITE_OTHER): Payer: Medicare Other

## 2017-07-23 DIAGNOSIS — M542 Cervicalgia: Secondary | ICD-10-CM | POA: Diagnosis not present

## 2017-07-23 DIAGNOSIS — Q782 Osteopetrosis: Secondary | ICD-10-CM | POA: Diagnosis not present

## 2017-07-23 DIAGNOSIS — R293 Abnormal posture: Secondary | ICD-10-CM

## 2017-07-23 DIAGNOSIS — M6281 Muscle weakness (generalized): Secondary | ICD-10-CM | POA: Diagnosis not present

## 2017-07-23 DIAGNOSIS — M62838 Other muscle spasm: Secondary | ICD-10-CM | POA: Diagnosis not present

## 2017-07-23 MED ORDER — DENOSUMAB 60 MG/ML ~~LOC~~ SOLN
60.0000 mg | Freq: Once | SUBCUTANEOUS | Status: AC
  Administered 2017-07-23: 60 mg via SUBCUTANEOUS

## 2017-07-23 NOTE — Therapy (Signed)
[facility] [address] [place], [place], [address] Phone: [phone]   Fax:  [phone]  Physical Therapy Treatment/Discharge Summary  Patient Details  Name: Shelly [name] MRN: [id number] Date of Birth: [birth year] Referring Provider: Dr. Carolann Littler  Encounter Date: 07/23/2017      PT End of Session - 07/23/17 1011    Visit Number 11   Number of Visits 20   Date for PT Re-Evaluation 08/11/17   Authorization Type medicare g-code 20th visit; kx modifier for 15th visit   PT Start Time 0930   PT Stop Time 1009   PT Time Calculation (min) 39 min   Activity Tolerance Patient tolerated treatment well      Past Medical History:  Diagnosis Date  . Arthritis    wrist, shoulder   . Diverticulitis   . Endometrial polyp   . GERD (gastroesophageal reflux disease)    occasional - diet controlled  . Hx of adenomatous colonic polyps   . Hyperlipidemia   . IBS (irritable bowel syndrome)   . Missed abortion    x 1 - resolved - no surgery  . Osteoporosis   . SVD (spontaneous vaginal delivery)    x 1  . Vitamin D deficiency     Past Surgical History:  Procedure Laterality Date  . COLONOSCOPY    . DIAGNOSTIC LAPAROSCOPY     cysts  . DILATATION & CURETTAGE/HYSTEROSCOPY WITH TRUECLEAR N/A 10/31/2013   Procedure: DILATATION & CURETTAGE/HYSTEROSCOPY WITH TRUCLEAR;  Surgeon: Marylynn Pearson, MD;  Location: [facility];  Service: Gynecology;  Laterality: N/A;  . DILATION AND CURETTAGE OF UTERUS    . TONSILLECTOMY  1952  . WISDOM TOOTH EXTRACTION      There were no vitals filed for this visit.      Subjective Assessment - 07/23/17 0933    Subjective I feel like I can turn my head better.   I'm doing better today.  Not as stiff this morning as I usually am.  I was able to eat some popcorn last night without pain.     Currently in Pain? No/denies   Pain Score 0-No pain   Pain Type Chronic pain   Aggravating  Factors  crunchy cereal;  non-supportive shoes   Pain Relieving Factors heat,stretching            [facility] PT Assessment - 07/23/17 0001      Observation/Other Assessments   Focus on Therapeutic Outcomes (FOTO)  36% limitation     AROM   Cervical Flexion 55   Cervical Extension 50   Cervical - Right Side Bend 40   Cervical - Left Side Bend 45   Cervical - Right Rotation 50   Cervical - Left Rotation 55   Jaw-Incisal Opening  40   Jaw-Left Lateral Excurison 54m   Jaw-Right Lateral Excursion 230m    Strength   Overall Strength Comments bil strength 4+/5                     [facility] Adult PT Treatment/Exercise - 07/23/17 0001      Therapeutic Activites    ADL's sitting, walking, standing     Neuro Re-ed    Neuro Re-ed Details  postural education related to osteoporosis; sitting posture alignment     Neck Exercises: Standing   Wall Push Ups 10 reps   Other Standing Exercises standing with back to wall and foam roll horrizontal abduction, ER, scap squeezes, cervical retraction -  20x each and with red band      Neck Exercises: Supine   Other Supine Exercise abdominal brace with hand to knee push 10x   Other Supine Exercise bridge with LEs over green ball 5x  discontinued secondary to low back discomfort     Shoulder Exercises: ROM/Strengthening   UBE (Upper Arm Bike) L0 x 6 min                PT Education - 07/23/17 1005    Education provided Yes   Education Details abdominal brace in supine   Person(s) Educated Patient   Methods Explanation;Demonstration;Handout   Comprehension Verbalized understanding;Returned demonstration          PT Short Term Goals - 07/23/17 0938      PT SHORT TERM GOAL #1   Title independent with initial HEP   Status Achieved     PT SHORT TERM GOAL #2   Title pain with daily activities is moderate due to improved awareness of posture   Status Achieved     PT SHORT TERM GOAL #3   Title wake up with moderate  pain due to improved sleeping posture and trigger points in neck and Jaw   Status Achieved     PT SHORT TERM GOAL #4   Title understand postures not to do with osteoporosis and what osteoporosis is about   Status Achieved           PT Long Term Goals - 07/23/17 0937      PT LONG TERM GOAL #1   Title Independent with HEP   Status Achieved     PT LONG TERM GOAL #2   Title understand correct body mechanics to decrease strain on neck and upper back    Status Achieved     PT LONG TERM GOAL #3   Title wake up in the morning with minimal pain due to understanding correct sleeping position and improve tissue mobility   Status Achieved     PT LONG TERM GOAL #4   Title Bilateral TMJ pain decreased to minimal due to improve mobility and reduction in trigger points so headaches are decreased by 50%   Status Achieved     PT LONG TERM GOAL #5   Title upper back pain decreased to minimal with daily activities due to improved postural strength and reduction of muscle tightness   Status Achieved     PT LONG TERM GOAL #6   Title FOTO score </= 36% limitation   Status Achieved               Plan - 07/23/17 0947    Clinical Impression Statement The patient reports she feels much better with decreased pain in her neck and jaw.  She also states she feels steadier/ better balance.  She has responded very well to postural strengthening in addition to dry needling and manual therapy.  Her FOTO score has improved significantly.  Her cervical ROM is WFLs.  UE strength grossly 4+/5.  She has met all short term and long term goals.  Recommend discharge from PT at this time.       PHYSICAL THERAPY DISCHARGE SUMMARY  Visits from Start of Care: 11  Current functional level related to goals / functional outcomes: See clinical impressions above   Remaining deficits: As above  Education / Equipment: Comprehensive HEP Plan: Patient agrees to discharge.  Patient goals were met. Patient is  being discharged due to meeting the stated rehab goals.  ?????  Patient will benefit from skilled therapeutic intervention in order to improve the following deficits and impairments:  Decreased range of motion, Increased fascial restricitons, Increased muscle spasms, Decreased endurance, Decreased activity tolerance, Pain, Impaired flexibility, Improper body mechanics, Decreased strength, Decreased mobility  Visit Diagnosis: Muscle weakness (generalized)  Cervicalgia  Other muscle spasm  Abnormal posture       G-Codes - 08-20-17 0946    Functional Assessment Tool Used (Outpatient Only) FOTO    Functional Limitation Other PT primary   Other PT Primary Goal Status (E3212) At least 20 percent but less than 40 percent impaired, limited or restricted   Other PT Primary Discharge Status (Y4825) At least 20 percent but less than 40 percent impaired, limited or restricted      Problem List Patient Active Problem List   Diagnosis Date Noted  . Prediabetes 04/06/2017  . Osteoporosis 06/12/2015  . IBS (irritable bowel syndrome) 08/21/2014  . Hyperlipidemia 08/05/2011  . ABDOMINAL PAIN RIGHT UPPER QUADRANT 10/30/2008  . ABDOMINAL BLOATING 10/25/2008  . GERD 06/04/2008  . DIVERTICULOSIS OF COLON 06/04/2008   Ruben Im, PT 20-Aug-2017 10:14 AM Phone: [phone] Fax: [phone] Alvera Singh Aug 20, 2017, 10:13 AM  [facility] [address] [place], [place], [address] Phone: [phone]   Fax:  [phone]  Name: Shelly [name] MRN: [id number] Date of Birth: [birth year]

## 2017-07-23 NOTE — Patient Instructions (Signed)
[facility] [address], [address] [place], [place] [address] Phone # [phone] Fax [phone]    

## 2017-08-11 ENCOUNTER — Telehealth: Payer: Self-pay | Admitting: Family Medicine

## 2017-08-11 ENCOUNTER — Ambulatory Visit (INDEPENDENT_AMBULATORY_CARE_PROVIDER_SITE_OTHER): Payer: Medicare Other | Admitting: *Deleted

## 2017-08-11 DIAGNOSIS — Z23 Encounter for immunization: Secondary | ICD-10-CM

## 2017-08-11 NOTE — Telephone Encounter (Signed)
Spoke with patient and she has a history of shingles.  Okay for patient to get vaccine?

## 2017-08-11 NOTE — Telephone Encounter (Signed)
Patient seen today for nurse visit for flu vaccine.  Pt asking about the shingles vaccine and getting her updated.  Last shingles vaccine was 2014.  Pt asking about the Shringrix.   Please advise. Patient states that she does not want this today since she is getting the flu vaccine.

## 2017-08-11 NOTE — Telephone Encounter (Signed)
Patient is aware 

## 2017-08-11 NOTE — Telephone Encounter (Signed)
OK to get

## 2017-08-16 DIAGNOSIS — Z6830 Body mass index (BMI) 30.0-30.9, adult: Secondary | ICD-10-CM | POA: Diagnosis not present

## 2017-08-16 DIAGNOSIS — Z01419 Encounter for gynecological examination (general) (routine) without abnormal findings: Secondary | ICD-10-CM | POA: Diagnosis not present

## 2017-10-08 ENCOUNTER — Ambulatory Visit: Payer: Medicare Other | Admitting: Family Medicine

## 2017-10-13 ENCOUNTER — Ambulatory Visit: Payer: Medicare Other | Admitting: Family Medicine

## 2017-10-21 ENCOUNTER — Other Ambulatory Visit: Payer: Self-pay

## 2017-10-22 ENCOUNTER — Ambulatory Visit (INDEPENDENT_AMBULATORY_CARE_PROVIDER_SITE_OTHER): Payer: Medicare Other | Admitting: Family Medicine

## 2017-10-22 DIAGNOSIS — R300 Dysuria: Secondary | ICD-10-CM

## 2017-10-22 DIAGNOSIS — R7303 Prediabetes: Secondary | ICD-10-CM

## 2017-10-22 LAB — POCT URINALYSIS DIPSTICK
Bilirubin, UA: NEGATIVE
Clarity, UA: NEGATIVE
Color, UA: NEGATIVE
Glucose, UA: NEGATIVE
Ketones, UA: NEGATIVE
Leukocytes, UA: NEGATIVE
Nitrite, UA: NEGATIVE
Protein, UA: NEGATIVE
Spec Grav, UA: 1.01 (ref 1.010–1.025)
Urobilinogen, UA: 0.2 E.U./dL
pH, UA: 5 (ref 5.0–8.0)

## 2017-10-22 LAB — POCT GLYCOSYLATED HEMOGLOBIN (HGB A1C): Hemoglobin A1C: 6.3

## 2017-10-22 NOTE — Progress Notes (Signed)
Subjective:     Patient ID: Shelly [name], female   DOB: 1946/08/05, 71 y.o.   MRN: [id number]  HPI Patient seen for several issues as follows:  2 week history of some suprapubic discomfort intermittently. She initially complained of dysuria, but has no burning with urination. No hematuria. No fevers or chills. She describes cramp-like discomfort intermittently lower abdomen. She does have history of IBS and thinks this may be related. She has intermittent loose stools. Sometimes triggered by things like salads. No bloody stools. She had colonoscopy a few years ago which showed benign polyps.  History of prediabetes. Last A1c 6.2%. Poor compliance with diet. She has started walking on treadmill recently and would like to lose some weight. No polyuria or polydipsia.  She is frustrated with difficulties losing weight.  Usually consumes 2 glasses of wine per day.  Has tried to scale back sugars and starches. She is on Prolia injections every 6 months for osteoporosis. Has tolerated well without side effects. Last bone density about 2 years ago  Past Medical History:  Diagnosis Date  . Arthritis    wrist, shoulder   . Diverticulitis   . Endometrial polyp   . GERD (gastroesophageal reflux disease)    occasional - diet controlled  . Hx of adenomatous colonic polyps   . Hyperlipidemia   . IBS (irritable bowel syndrome)   . Missed abortion    x 1 - resolved - no surgery  . Osteoporosis   . SVD (spontaneous vaginal delivery)    x 1  . Vitamin D deficiency    Past Surgical History:  Procedure Laterality Date  . COLONOSCOPY    . DIAGNOSTIC LAPAROSCOPY     cysts  . DILATATION & CURETTAGE/HYSTEROSCOPY WITH TRUECLEAR N/A 10/31/2013   Procedure: DILATATION & CURETTAGE/HYSTEROSCOPY WITH TRUCLEAR;  Surgeon: Marylynn Pearson, MD;  Location: [facility];  Service: Gynecology;  Laterality: N/A;  . DILATION AND CURETTAGE OF UTERUS    . TONSILLECTOMY  1952  . WISDOM TOOTH EXTRACTION      reports that  she quit smoking about 10 years ago. Her smoking use included cigarettes. She has a 2.00 pack-year smoking history. she has never used smokeless tobacco. She reports that she drinks about 8.4 oz of alcohol per week. She reports that she does not use drugs. family history includes Breast cancer in her maternal aunt; Breast cancer (age of onset: 85) in her cousin; Depression in her brother; Diabetes in her mother; Heart disease in her other; Hyperlipidemia in her father and mother; Hypertension in her mother and other; Leukemia in her mother; Lung cancer in her father; Uterine cancer in her other and paternal grandmother. Allergies  Allergen Reactions  . Crestor [Rosuvastatin Calcium]     GI upset  . Lipitor [Atorvastatin Calcium] Itching  . Pravastatin     GI upset  . Sulfonamide Derivatives     Hives, itiching    Review of Systems  Constitutional: Negative for fatigue.  Eyes: Negative for visual disturbance.  Respiratory: Negative for cough, chest tightness, shortness of breath and wheezing.   Cardiovascular: Negative for chest pain, palpitations and leg swelling.  Neurological: Negative for dizziness, seizures, syncope, weakness, light-headedness and headaches.       Objective:   Physical Exam  Constitutional: She appears well-developed and well-nourished.  Eyes: Pupils are equal, round, and reactive to light.  Neck: Neck supple. No JVD present. No thyromegaly present.  Cardiovascular: Normal rate and regular rhythm. Exam reveals no gallop.  Pulmonary/Chest: Effort  normal and breath sounds normal. No respiratory distress. She has no wheezes. She has no rales.  Musculoskeletal: She exhibits no edema.  Neurological: She is alert.       Assessment:     #1 history of prediabetes. A1c today stable 6.3%  #2 intermittent suprapubic discomfort. Suspect IBS related. Urine dipstick reveals trace blood otherwise negative.  No suggestion of UTI.    #3 osteoporosis    Plan:      -Establish more consistent weightbearing exercise -We challenged her to try to lose about 7-8 pounds -Reduce wine consumption to one glass per day -Routine follow-up in 6 months and recheck A1c then  Eulas Post MD [facility]

## 2017-10-22 NOTE — Patient Instructions (Signed)
Get back to regular walking on treadmill A1C today=6.3% Let's plan on 6 month follow up.

## 2017-11-16 DIAGNOSIS — H539 Unspecified visual disturbance: Secondary | ICD-10-CM

## 2017-11-16 HISTORY — DX: Unspecified visual disturbance: H53.9

## 2017-12-24 ENCOUNTER — Other Ambulatory Visit: Payer: Self-pay | Admitting: Family Medicine

## 2017-12-24 ENCOUNTER — Telehealth: Payer: Self-pay | Admitting: Family Medicine

## 2017-12-24 DIAGNOSIS — M810 Age-related osteoporosis without current pathological fracture: Secondary | ICD-10-CM

## 2017-12-24 NOTE — Telephone Encounter (Signed)
Pt is due for her polia injection on 01/21/18. I am currently process her insurance information to obtain a summery of benefits from [place].  Which labs would you like to order for pt? Please advise.

## 2017-12-24 NOTE — Telephone Encounter (Signed)
Lab work ordered. Pt was called and made aware. Lab appt made for 01/03/18 and nurse visit appt made for 01/21/18.

## 2017-12-24 NOTE — Telephone Encounter (Signed)
BMP

## 2018-01-03 ENCOUNTER — Other Ambulatory Visit: Payer: Medicare Other

## 2018-01-07 ENCOUNTER — Ambulatory Visit (INDEPENDENT_AMBULATORY_CARE_PROVIDER_SITE_OTHER): Payer: Medicare Other

## 2018-01-07 ENCOUNTER — Other Ambulatory Visit (INDEPENDENT_AMBULATORY_CARE_PROVIDER_SITE_OTHER): Payer: Medicare Other

## 2018-01-07 VITALS — BP 142/70 | HR 75 | Ht 64.5 in | Wt 178.0 lb

## 2018-01-07 DIAGNOSIS — Z0000 Encounter for general adult medical examination without abnormal findings: Secondary | ICD-10-CM

## 2018-01-07 DIAGNOSIS — Z1159 Encounter for screening for other viral diseases: Secondary | ICD-10-CM

## 2018-01-07 DIAGNOSIS — M810 Age-related osteoporosis without current pathological fracture: Secondary | ICD-10-CM | POA: Diagnosis not present

## 2018-01-07 LAB — BASIC METABOLIC PANEL
BUN: 22 mg/dL (ref 6–23)
CO2: 29 mEq/L (ref 19–32)
Calcium: 9.4 mg/dL (ref 8.4–10.5)
Chloride: 105 mEq/L (ref 96–112)
Creatinine, Ser: 0.88 mg/dL (ref 0.40–1.20)
GFR: 66.91 mL/min (ref >60.00)
Glucose, Bld: 113 mg/dL — ABNORMAL HIGH (ref 70–99)
Potassium: 4.3 mEq/L (ref 3.5–5.1)
Sodium: 140 mEq/L (ref 135–145)

## 2018-01-07 NOTE — Progress Notes (Addendum)
Subjective:   Shelly [name] is a 72 y.o. female who presents for Medicare Annual (Subsequent) preventive examination.  Reports health as 12/7 OV for prediabetes Frustrated with weight loss   Cooks and cleans for dtr;    Diet Chol/hdl 3; chol 166; hdl 58; trig 102 A1c 6.2 -6.3 10/2017 BMI 31  IBS bothers her periodically  3 meals In the am eats egg; oatmeal; toast Lunch; eats out; 1/2 of sandwich and salad  Watches sweets;  Tires to cut back  Spouse likes deserts; but bakes apples  Spouse grills   Exercise Loves cleaning the home- has a 4 bedroom home Cleans x 2 days and then cleans for her dtr  Loves their home and plans  Is careful up and down stairs   Hurts when she sits a long time  Health Maintenance Due  Topic Date Due  . Hepatitis C Screening  [birth year]    Osteoporosis and taking prolia Dexa 8.2016  Dr. Jinny Blossom at [facility] is following   Pap in 2013   Cardiac Risk Factors include: advanced age (>62men, >65 women);family history of premature cardiovascular disease;obesity (BMI >30kg/m2) ETOH 2 glasses of wine every evening Tobacco quit 2008 but only had 2 pack years  Colonoscopy 08/2014 Mammogram 05/2017; repeat in July        Objective:     Vitals: BP (!) 142/70   Pulse 75   Ht 5' 4.5" (1.638 m)   Wt 178 lb (80.7 kg)   SpO2 96%   BMI 30.08 kg/m   Body mass index is 30.08 kg/m.  BP at home 132/128 over 70  Advanced Directives 01/07/2018 06/16/2017 09/12/2014 10/27/2013  Does Patient Have a Medical Advance Directive? Yes Yes Yes Patient has advance directive, copy not in chart  Type of Advance Directive - Living will [place];Living will Living will  Does patient want to make changes to medical advance directive? - No - Patient declined - No  Copy of Healthcare Power of Attorney in Chart? - - - Copy requested from family    Tobacco Social History   Tobacco Use  Smoking Status Former Smoker  .  Packs/day: 0.10  . Years: 20.00  . Pack years: 2.00  . Types: Cigarettes  . Last attempt to quit: 05/15/2007  . Years since quitting: 10.6  Smokeless Tobacco Never Used     Counseling given: Not Answered   Clinical Intake:   Past Medical History:  Diagnosis Date  . Arthritis    wrist, shoulder   . Diverticulitis   . Endometrial polyp   . GERD (gastroesophageal reflux disease)    occasional - diet controlled  . Hx of adenomatous colonic polyps   . Hyperlipidemia   . IBS (irritable bowel syndrome)   . Missed abortion    x 1 - resolved - no surgery  . Osteoporosis   . SVD (spontaneous vaginal delivery)    x 1  . Vitamin D deficiency    Past Surgical History:  Procedure Laterality Date  . COLONOSCOPY    . DIAGNOSTIC LAPAROSCOPY     cysts  . DILATATION & CURETTAGE/HYSTEROSCOPY WITH TRUECLEAR N/A 10/31/2013   Procedure: DILATATION & CURETTAGE/HYSTEROSCOPY WITH TRUCLEAR;  Surgeon: Marylynn Pearson, MD;  Location: [facility];  Service: Gynecology;  Laterality: N/A;  . DILATION AND CURETTAGE OF UTERUS    . TONSILLECTOMY  1952  . WISDOM TOOTH EXTRACTION     Family History  Problem Relation Age of Onset  .  Hyperlipidemia Mother   . Hypertension Mother   . Diabetes Mother        type ll  . Leukemia Mother   . Lung cancer Father   . Hyperlipidemia Father   . Breast cancer Cousin 30  . Uterine cancer Other        grandmother  . Heart disease Other   . Hypertension Other   . Depression Brother   . Breast cancer Maternal Aunt   . Uterine cancer Paternal Grandmother    Social History   Socioeconomic History  . Marital status: Married    Spouse name: Not on file  . Number of children: Not on file  . Years of education: Not on file  . Highest education level: Not on file  Social Needs  . Financial resource strain: Not on file  . Food insecurity - worry: Not on file  . Food insecurity - inability: Not on file  . Transportation needs - medical: Not on file  .  Transportation needs - non-medical: Not on file  Occupational History  . Not on file  Tobacco Use  . Smoking status: Former Smoker    Packs/day: 0.10    Years: 20.00    Pack years: 2.00    Types: Cigarettes    Last attempt to quit: 05/15/2007    Years since quitting: 10.6  . Smokeless tobacco: Never Used  Substance and Sexual Activity  . Alcohol use: Yes    Alcohol/week: 8.4 oz    Types: 14 Glasses of wine per week    Comment: 2 glasses wine every evening  . Drug use: No  . Sexual activity: Yes    Birth control/protection: Post-menopausal  Other Topics Concern  . Not on file  Social History Narrative  . Not on file    Outpatient Encounter Medications as of 01/07/2018  Medication Sig  . acetaminophen (TYLENOL) 500 MG tablet Take 500 mg by mouth every 6 (six) hours as needed for headache.  . clidinium-chlordiazePOXIDE (LIBRAX) 5-2.5 MG capsule TAKE ONE CAPSULE BY MOUTH EVERY 8 HOURS AS NEEDED  . esomeprazole (NEXIUM) 20 MG capsule Take 20 mg by mouth daily at 12 noon.  . Fluocinolone Acetonide 0.01 % SHAM Apply 1 application topically daily as needed.  . loratadine (CLARITIN) 10 MG tablet Take 10 mg by mouth.  Marland Kitchen LORazepam (ATIVAN) 0.5 MG tablet Take one tablet by mouth one hour before appointment prn anxiety. Ensure that someone drives you to the appointment.  . triamcinolone cream (KENALOG) 0.1 % APPLY 1 APPLICATION TOPICALLY 2 (TWO) TIMES DAILY AS NEEDED.  . [DISCONTINUED] omeprazole (PRILOSEC) 40 MG capsule Take 40 mg by mouth daily.    . [DISCONTINUED] pravastatin (PRAVACHOL) 40 MG tablet Take 1 tablet (40 mg total) by mouth every evening.   No facility-administered encounter medications on file as of 01/07/2018.     Activities of Daily Living In your present state of health, do you have any difficulty performing the following activities: 01/07/2018  Hearing? N  Vision? N  Difficulty concentrating or making decisions? N  Walking or climbing stairs? N  Dressing or bathing?  N  Doing errands, shopping? N  Preparing Food and eating ? N  Using the Toilet? N  In the past six months, have you accidently leaked urine? N  Do you have problems with loss of bowel control? N  Managing your Medications? N  Managing your Finances? N  Housekeeping or managing your Housekeeping? N  Some recent data might be hidden  Patient Care Team: Eulas Post, MD as PCP - General (Family Medicine)    Assessment:   This is a routine wellness examination for Shelly [name].  Exercise Activities and Dietary recommendations Current Exercise Habits: Home exercise routine, Time (Minutes): 60, Frequency (Times/Week): 3, Weekly Exercise (Minutes/Week): 180, Intensity: Moderate  Goals    . Weight (lb) < 165 lb (74.8 kg)     Went on weight watchers in the past and walking more during the day Cutting back on food; cut back on butter;  Continue DB exercises - part of losing weight is relaxation     Serving Sizes A serving size is a measured amount of food or drink, such as one slice of bread, that has an associated nutrient content. Knowing the serving size of a food or drink can help you determine how much of that food you should consume. What is the size of one serving? The size of one healthy serving depends on the food or drink. To determine a serving size, read the food label. If the food or drink does not have a food label, try to find serving size information online. Or, use the following to estimate the size of one adult serving: Grain 1 slice bread.  bagel.  cup pasta. Vegetable  cup cooked or canned vegetables. 1 cup raw, leafy greens. Fruit  cup canned fruit. 1 medium fruit.  cup dried fruit. Meat and Other Protein Sources 1 oz meat, poultry, or fish.  cup cooked beans. 1 egg.  cup nuts or seeds. 1 Tbsp nut butter.  cup tofu or tempeh. 2 Tbsp hummus. Dairy An individual container of yogurt (6-8 oz). 1 piece of cheese the size of your thumb (1 oz). 1 cup (8 oz)  milk or milk alternative. Fat A piece the size of one dice. 1 tsp soft margarine. 1 Tbsp mayonnaise. 1 tsp vegetable oil. 1 Tbsp regular salad dressing. 2 Tbsp low-fat salad dressing. How many servings should I eat from each food group each day? The following are the suggested number of servings to try and have every day from each food group. You can also look at your eating throughout the week and aim for meeting these requirements on most days for overall healthy eating. Grain 6-8 servings. Try to have half of your grains from whole grains, such as whole wheat bread, corn tortillas, oatmeal, brown rice, whole wheat pasta, and bulgur. Vegetable At least 2-3 servings. Fruit 2 servings. Meat and Other Protein Foods 5-6 servings. Aim to have lean proteins, such as chicken, [place], fish, beans, or tofu. Dairy 3 servings. Choose low-fat or nonfat if you are trying to control your weight. Fat 2-3 servings. Is a serving the same thing as a portion? No. A portion is the actual amount you eat, which may be more than one serving. Knowing the specific serving size of a food and the nutritional information that goes with it can help you make a healthy decision on what size portion to eat. What are some tips to help me learn healthy serving sizes?  Check food labels for serving sizes. Many foods that come as a single portion actually contain multiple servings.  Determine the serving size of foods you commonly eat and figure out how large a portion you usually eat.  Measure the number of servings that can be held by the bowls, glasses, cups, and plates you typically use. For example, pour your breakfast cereal into your regular bowl and then pour it into a measuring  cup.  For 1-2 days, measure the serving sizes of all the foods you eat.  Practice estimating serving sizes and determining how big your portions should be. This information is not intended to replace advice given to you by your health  care provider. Make sure you discuss any questions you have with your health care provider. Document Released: 08/01/2003 Document Revised: 06/27/2016 Document Reviewed: 01/30/2014 Elsevier Interactive Patient Education  2018 [place] Risk  01/07/2018 10/21/2017 05/18/2016 10/15/2015 12/28/2013  Falls in the past year? No No No No No  Comment - Emmi Telephone Survey: data to providers prior to load - - -     Depression Screen PHQ 2/9 Scores 01/07/2018 06/14/2017 05/18/2016 10/15/2015  PHQ - 2 Score 0 0 0 0     Cognitive Function MMSE - Mini Mental State Exam 01/07/2018  Not completed: (No Data)     Ad8 score reviewed for issues:  Issues making decisions:  Less interest in hobbies / activities:  Repeats questions, stories (family complaining):  Trouble using ordinary gadgets (microwave, computer, phone):  Forgets the month or year:   Mismanaging finances:   Remembering appts:  Daily problems with thinking and/or memory: Ad8 score is=0        Immunization History  Administered Date(s) Administered  . Influenza Split 08/05/2011, 07/28/2012  . Influenza, High Dose Seasonal PF 07/30/2015, 08/07/2016, 08/11/2017  . Influenza,inj,Quad PF,6+ Mos 08/01/2013, 07/18/2014  . Pneumococcal Conjugate-13 05/14/2014  . Pneumococcal Polysaccharide-23 06/05/2011  . Td 12/15/2003  . Tdap 05/14/2014  . Zoster 03/13/2013     Screening Tests Health Maintenance  Topic Date Due  . Hepatitis C Screening  [birth year]  . MAMMOGRAM  05/19/2019  . COLONOSCOPY  09/13/2019  . TETANUS/TDAP  05/14/2024  . INFLUENZA VACCINE  Completed  . DEXA SCAN  Completed  . PNA vac Low Risk Adult  Completed         Plan:      PCP Notes   Health Maintenance Taking prolia for osteoporosis      Abnormal Screens  Educated regarding pre-diabetes Educated regarding the shingrix   Referrals  None   Patient concerns; Losing weight and spent 30 minutes  discussing weight  Loss; the patient developed plan based on past success    Nurse Concerns; C/o of some pain where her old fx were under her left breast  Takes tylenol   Next PCP apt    I have personally reviewed and noted the following in the patient's chart:   . Medical and social history . Use of alcohol, tobacco or illicit drugs  . Current medications and supplements . Functional ability and status . Nutritional status . Physical activity . Advanced directives . List of other physicians . Hospitalizations, surgeries, and ER visits in previous 12 months . Vitals . Screenings to include cognitive, depression, and falls . Referrals and appointments  In addition, I have reviewed and discussed with patient certain preventive protocols, quality metrics, and best practice recommendations. A written personalized care plan for preventive services as well as general preventive health recommendations were provided to patient.     BWGYK,ZLDJT, RN  01/07/2018  Agree with assessment as above.  Eulas Post MD [facility] at [facility]

## 2018-01-07 NOTE — Patient Instructions (Addendum)
Shelly [name] , Thank you for taking time to come for your Medicare Wellness Visit. I appreciate your ongoing commitment to your health goals. Please review the following plan we discussed and let me know if I can assist you in the future.   Dr. Elease Hashimoto is ordering prolia Will not have another dexa for now, since you are on prolia  Shingrix is a vaccine for the prevention of Shingles in Adults 50 and older.  If you are on Medicare, you can request a prescription from your doctor to be filled at a pharmacy.  Please check with your benefits regarding applicable copays or out of pocket expenses.  The Shingrix is given in 2 vaccines approx 8 weeks apart. You must receive the 2nd dose prior to 6 months from receipt of the first.     Pre-diabetes Educated regarding prediabetes and numbers;  A1c ranges from 5.8 to 6.5 or fasting Blood sugar > 115 -126; (126 is diabetic)   Risk: >45yo; family hx; overweight or obese; African American; Hispanic; Latino; American [place] American; [place]; history of diabetes when pregnant; or birth to a baby weighing over 9 lbs. Being less physically active than 30 minutes; 3 times a week;   Prevention; Losing a modest 7 to 8 lbs; If over 200 lbs; 10 to 14 lbs;  Choose healthier foods; colorful veggies; fish or lean meats; drinks water Reduce portion size Start exercising; 30 minutes of fast walking x 30 minutes per day/ 60 min for weight loss    Results for Shelly [name], Shelly [name] (MRN [id number]) as of 01/07/2018 14:29  Ref. Range 06/04/2011 10:04 10/27/2013 10:45 05/18/2016 09:57 04/06/2017 09:01 10/22/2017 10:49  Glucose Latest Ref Range: 70 - 99 mg/dL 109 (H) 100 (H) 114 (H)    Hemoglobin A1C Unknown   6.7 (H) 6.2 6.3    Cholesterol levels are good  Results for Shelly [name], Shelly [name] (MRN [id number]) as of 01/07/2018 14:29  Ref. Range 06/04/2011 10:04 09/16/2011 12:06  Total CHOL/HDL Ratio Unknown 4 3  Cholesterol Latest Ref Range: 0 - 200 mg/dL 273 (H)  166  HDL Cholesterol Latest Ref Range: >39.00 mg/dL 61.30 58.90  LDL (calc) Latest Ref Range: 0 - 99 mg/dL  87  Direct LDL Latest Units: mg/dL 215.9   Triglycerides Latest Ref Range: 0.0 - 149.0 mg/dL 123.0 103.0  VLDL Latest Ref Range: 0.0 - 40.0 mg/dL 24.6 20.6     These are the goals we discussed: Goals    . Weight (lb) < 165 lb (74.8 kg)     Went on weight watchers in the past and walking more during the day Cutting back on food; cut back on butter;  Continue DB exercises - part of losing weight is relaxation     Serving Sizes A serving size is a measured amount of food or drink, such as one slice of bread, that has an associated nutrient content. Knowing the serving size of a food or drink can help you determine how much of that food you should consume. What is the size of one serving? The size of one healthy serving depends on the food or drink. To determine a serving size, read the food label. If the food or drink does not have a food label, try to find serving size information online. Or, use the following to estimate the size of one adult serving: Grain 1 slice bread.  bagel.  cup pasta. Vegetable  cup cooked or canned vegetables. 1 cup raw, leafy greens. Fruit  cup canned fruit. 1 medium fruit.  cup dried fruit. Meat and Other Protein Sources 1 oz meat, poultry, or fish.  cup cooked beans. 1 egg.  cup nuts or seeds. 1 Tbsp nut butter.  cup tofu or tempeh. 2 Tbsp hummus. Dairy An individual container of yogurt (6-8 oz). 1 piece of cheese the size of your thumb (1 oz). 1 cup (8 oz) milk or milk alternative. Fat A piece the size of one dice. 1 tsp soft margarine. 1 Tbsp mayonnaise. 1 tsp vegetable oil. 1 Tbsp regular salad dressing. 2 Tbsp low-fat salad dressing. How many servings should I eat from each food group each day? The following are the suggested number of servings to try and have every day from each food group. You can also look at your eating throughout  the week and aim for meeting these requirements on most days for overall healthy eating. Grain 6-8 servings. Try to have half of your grains from whole grains, such as whole wheat bread, corn tortillas, oatmeal, brown rice, whole wheat pasta, and bulgur. Vegetable At least 2-3 servings. Fruit 2 servings. Meat and Other Protein Foods 5-6 servings. Aim to have lean proteins, such as chicken, [place], fish, beans, or tofu. Dairy 3 servings. Choose low-fat or nonfat if you are trying to control your weight. Fat 2-3 servings. Is a serving the same thing as a portion? No. A portion is the actual amount you eat, which may be more than one serving. Knowing the specific serving size of a food and the nutritional information that goes with it can help you make a healthy decision on what size portion to eat. What are some tips to help me learn healthy serving sizes?  Check food labels for serving sizes. Many foods that come as a single portion actually contain multiple servings.  Determine the serving size of foods you commonly eat and figure out how large a portion you usually eat.  Measure the number of servings that can be held by the bowls, glasses, cups, and plates you typically use. For example, pour your breakfast cereal into your regular bowl and then pour it into a measuring cup.  For 1-2 days, measure the serving sizes of all the foods you eat.  Practice estimating serving sizes and determining how big your portions should be. This information is not intended to replace advice given to you by your health care provider. Make sure you discuss any questions you have with your health care provider. Document Released: 08/01/2003 Document Revised: 06/27/2016 Document Reviewed: 01/30/2014 Elsevier Interactive Patient Education  [employer].         This is a list of the screening recommended for you and due dates:  Health Maintenance  Topic Date Due  .  Hepatitis C: One time  screening is recommended by Center for Disease Control  (CDC) for  adults born from [birth year] through 1967.   May 13, 1946  . Mammogram  05/19/2019  . Colon Cancer Screening  09/13/2019  . Tetanus Vaccine  05/14/2024  . Flu Shot  Completed  . DEXA scan (bone density measurement)  Completed  . Pneumonia vaccines  Completed      Fat and Cholesterol Restricted Diet Getting too much fat and cholesterol in your diet may cause health problems. Following this diet helps keep your fat and cholesterol at normal levels. This can keep you from getting sick. What types of fat should I choose?  Choose monosaturated and polyunsaturated fats. These are found in foods such  as olive oil, canola oil, flaxseeds, walnuts, almonds, and seeds.  Eat more omega-3 fats. Good choices include salmon, mackerel, sardines, tuna, flaxseed oil, and ground flaxseeds.  Limit saturated fats. These are in animal products such as meats, butter, and cream. They can also be in plant products such as palm oil, palm kernel oil, and coconut oil.  Avoid foods with partially hydrogenated oils in them. These contain trans fats. Examples of foods that have trans fats are stick margarine, some tub margarines, cookies, crackers, and other baked goods. What general guidelines do I need to follow?  Check food labels. Look for the words "trans fat" and "saturated fat."  When preparing a meal: ? Fill half of your plate with vegetables and green salads. ? Fill one fourth of your plate with whole grains. Look for the word "whole" as the first word in the ingredient list. ? Fill one fourth of your plate with lean protein foods.  Eat more foods that have fiber, like apples, carrots, beans, peas, and barley.  Eat more home-cooked foods. Eat less at restaurants and buffets.  Limit or avoid alcohol.  Limit foods high in starch and sugar.  Limit fried foods.  Cook foods without frying them. Baking, boiling, grilling, and broiling are all  great options.  Lose weight if you are overweight. Losing even a small amount of weight can help your overall health. It can also help prevent diseases such as diabetes and heart disease. What foods can I eat? Grains Whole grains, such as whole wheat or whole grain breads, crackers, cereals, and pasta. Unsweetened oatmeal, bulgur, barley, quinoa, or brown rice. Corn or whole wheat flour tortillas. Vegetables Fresh or frozen vegetables (raw, steamed, roasted, or grilled). Green salads. Fruits All fresh, canned (in natural juice), or frozen fruits. Meat and Other Protein Products Ground beef (85% or leaner), grass-fed beef, or beef trimmed of fat. Skinless chicken or [place]. Ground chicken or [place]. Pork trimmed of fat. All fish and seafood. Eggs. Dried beans, peas, or lentils. Unsalted nuts or seeds. Unsalted canned or dry beans. Dairy Low-fat dairy products, such as skim or 1% milk, 2% or reduced-fat cheeses, low-fat ricotta or cottage cheese, or plain low-fat yogurt. Fats and Oils Tub margarines without trans fats. Light or reduced-fat mayonnaise and salad dressings. Avocado. Olive, canola, sesame, or safflower oils. Natural peanut or almond butter (choose ones without added sugar and oil). The items listed above may not be a complete list of recommended foods or beverages. Contact your dietitian for more options. What foods are not recommended? Grains White bread. White pasta. White rice. Cornbread. Bagels, pastries, and croissants. Crackers that contain trans fat. Vegetables White potatoes. Corn. Creamed or fried vegetables. Vegetables in a cheese sauce. Fruits Dried fruits. Canned fruit in light or heavy syrup. Fruit juice. Meat and Other Protein Products Fatty cuts of meat. Ribs, chicken wings, bacon, sausage, bologna, salami, chitterlings, fatback, hot dogs, bratwurst, and packaged luncheon meats. Liver and organ meats. Dairy Whole or 2% milk, cream, half-and-half, and cream cheese.  Whole milk cheeses. Whole-fat or sweetened yogurt. Full-fat cheeses. Nondairy creamers and whipped toppings. Processed cheese, cheese spreads, or cheese curds. Sweets and Desserts Corn syrup, sugars, honey, and molasses. Candy. Jam and jelly. Syrup. Sweetened cereals. Cookies, pies, cakes, donuts, muffins, and ice cream. Fats and Oils Butter, stick margarine, lard, shortening, ghee, or bacon fat. Coconut, palm kernel, or palm oils. Beverages Alcohol. Sweetened drinks (such as sodas, lemonade, and fruit drinks or punches). The items listed above  may not be a complete list of foods and beverages to avoid. Contact your dietitian for more information. This information is not intended to replace advice given to you by your health care provider. Make sure you discuss any questions you have with your health care provider. Document Released: 05/03/2012 Document Revised: 07/09/2016 Document Reviewed: 02/01/2014 Elsevier Interactive Patient Education  2018 [place] in the Home Falls can cause injuries. They can happen to people of all ages. There are many things you can do to make your home safe and to help prevent falls. What can I do on the outside of my home?  Regularly fix the edges of walkways and driveways and fix any cracks.  Remove anything that might make you trip as you walk through a door, such as a raised step or threshold.  Trim any bushes or trees on the path to your home.  Use bright outdoor lighting.  Clear any walking paths of anything that might make someone trip, such as rocks or tools.  Regularly check to see if handrails are loose or broken. Make sure that both sides of any steps have handrails.  Any raised decks and porches should have guardrails on the edges.  Have any leaves, snow, or ice cleared regularly.  Use sand or salt on walking paths during winter.  Clean up any spills in your garage right away. This includes oil or grease spills. What  can I do in the bathroom?  Use night lights.  Install grab bars by the toilet and in the tub and shower. Do not use towel bars as grab bars.  Use non-skid mats or decals in the tub or shower.  If you need to sit down in the shower, use a plastic, non-slip stool.  Keep the floor dry. Clean up any water that spills on the floor as soon as it happens.  Remove soap buildup in the tub or shower regularly.  Attach bath mats securely with double-sided non-slip rug tape.  Do not have throw rugs and other things on the floor that can make you trip. What can I do in the bedroom?  Use night lights.  Make sure that you have a light by your bed that is easy to reach.  Do not use any sheets or blankets that are too big for your bed. They should not hang down onto the floor.  Have a firm chair that has side arms. You can use this for support while you get dressed.  Do not have throw rugs and other things on the floor that can make you trip. What can I do in the kitchen?  Clean up any spills right away.  Avoid walking on wet floors.  Keep items that you use a lot in easy-to-reach places.  If you need to reach something above you, use a strong step stool that has a grab bar.  Keep electrical cords out of the way.  Do not use floor polish or wax that makes floors slippery. If you must use wax, use non-skid floor wax.  Do not have throw rugs and other things on the floor that can make you trip. What can I do with my stairs?  Do not leave any items on the stairs.  Make sure that there are handrails on both sides of the stairs and use them. Fix handrails that are broken or loose. Make sure that handrails are as long as the stairways.  Check any carpeting to make sure that  it is firmly attached to the stairs. Fix any carpet that is loose or worn.  Avoid having throw rugs at the top or bottom of the stairs. If you do have throw rugs, attach them to the floor with carpet tape.  Make sure  that you have a light switch at the top of the stairs and the bottom of the stairs. If you do not have them, ask someone to add them for you. What else can I do to help prevent falls?  Wear shoes that: ? Do not have high heels. ? Have rubber bottoms. ? Are comfortable and fit you well. ? Are closed at the toe. Do not wear sandals.  If you use a stepladder: ? Make sure that it is fully opened. Do not climb a closed stepladder. ? Make sure that both sides of the stepladder are locked into place. ? Ask someone to hold it for you, if possible.  Clearly mark and make sure that you can see: ? Any grab bars or handrails. ? First and last steps. ? Where the edge of each step is.  Use tools that help you move around (mobility aids) if they are needed. These include: ? Canes. ? Walkers. ? Scooters. ? Crutches.  Turn on the lights when you go into a dark area. Replace any light bulbs as soon as they burn out.  Set up your furniture so you have a clear path. Avoid moving your furniture around.  If any of your floors are uneven, fix them.  If there are any pets around you, be aware of where they are.  Review your medicines with your doctor. Some medicines can make you feel dizzy. This can increase your chance of falling. Ask your doctor what other things that you can do to help prevent falls. This information is not intended to replace advice given to you by your health care provider. Make sure you discuss any questions you have with your health care provider. Document Released: 08/29/2009 Document Revised: 04/09/2016 Document Reviewed: 12/07/2014 Elsevier Interactive Patient Education  2018 [employer].   Hearing Loss Hearing loss is a partial or total loss of the ability to hear. This can be temporary or permanent, and it can happen in one or both ears. Hearing loss may be referred to as deafness. Medical care is necessary to treat hearing loss properly and to prevent the condition  from getting worse. Your hearing may partially or completely come back, depending on what caused your hearing loss and how severe it is. In some cases, hearing loss is permanent. What are the causes? Common causes of hearing loss include:  Too much wax in the ear canal.  Infection of the ear canal or middle ear.  Fluid in the middle ear.  Injury to the ear or surrounding area.  An object stuck in the ear.  Prolonged exposure to loud sounds, such as music.  Less common causes of hearing loss include:  Tumors in the ear.  Viral or bacterial infections, such as meningitis.  A hole in the eardrum (perforated eardrum).  Problems with the hearing nerve that sends signals between the brain and the ear.  Certain medicines.  What are the signs or symptoms? Symptoms of this condition may include:  Difficulty telling the difference between sounds.  Difficulty following a conversation when there is background noise.  Lack of response to sounds in your environment. This may be most noticeable when you do not respond to startling sounds.  Needing to  turn up the volume on the television, radio, etc.  Ringing in the ears.  Dizziness.  Pain in the ears.  How is this diagnosed? This condition is diagnosed based on a physical exam and a hearing test (audiometry). The audiometry test will be performed by a hearing specialist (audiologist). You may also be referred to an ear, nose, and throat (ENT) specialist (otolaryngologist). How is this treated? Treatment for recent onset of hearing loss may include:  Ear wax removal.  Being prescribed medicines to prevent infection (antibiotics).  Being prescribed medicines to reduce inflammation (corticosteroids).  Follow these instructions at home:  If you were prescribed an antibiotic medicine, take it as told by your health care provider. Do not stop taking the antibiotic even if you start to feel better.  Take over-the-counter and  prescription medicines only as told by your health care provider.  Avoid loud noises.  Return to your normal activities as told by your health care provider. Ask your health care provider what activities are safe for you.  Keep all follow-up visits as told by your health care provider. This is important. Contact a health care provider if:  You feel dizzy.  You develop new symptoms.  You vomit or feel nauseous.  You have a fever. Get help right away if:  You develop sudden changes in your vision.  You have severe ear pain.  You have new or increased weakness.  You have a severe headache. This information is not intended to replace advice given to you by your health care provider. Make sure you discuss any questions you have with your health care provider. Document Released: 11/02/2005 Document Revised: 04/09/2016 Document Reviewed: 03/20/2015 Elsevier Interactive Patient Education  2018 [employer].

## 2018-01-21 ENCOUNTER — Ambulatory Visit (INDEPENDENT_AMBULATORY_CARE_PROVIDER_SITE_OTHER): Payer: Medicare Other | Admitting: Family Medicine

## 2018-01-21 DIAGNOSIS — M818 Other osteoporosis without current pathological fracture: Secondary | ICD-10-CM | POA: Diagnosis not present

## 2018-01-21 MED ORDER — DENOSUMAB 60 MG/ML ~~LOC~~ SOLN
60.0000 mg | Freq: Once | SUBCUTANEOUS | Status: AC
  Administered 2018-01-21: 60 mg via SUBCUTANEOUS

## 2018-01-21 NOTE — Progress Notes (Signed)
Per orders of Dr. Elease Hashimoto, injection of Prolia given by Miles Costain. Patient tolerated injection well.

## 2018-03-14 ENCOUNTER — Ambulatory Visit (INDEPENDENT_AMBULATORY_CARE_PROVIDER_SITE_OTHER): Payer: Medicare Other | Admitting: Family Medicine

## 2018-03-14 VITALS — BP 110/80 | HR 87 | Temp 98.8°F | Wt 179.0 lb

## 2018-03-14 DIAGNOSIS — L509 Urticaria, unspecified: Secondary | ICD-10-CM

## 2018-03-14 DIAGNOSIS — R05 Cough: Secondary | ICD-10-CM

## 2018-03-14 DIAGNOSIS — R059 Cough, unspecified: Secondary | ICD-10-CM

## 2018-03-14 NOTE — Patient Instructions (Signed)
Consider change of anti-histamine from Claritan to Allegra,  Zyrtec, or Xyzal.    May also consider OTC Pepcid or Zantac for the hives.

## 2018-03-14 NOTE — Progress Notes (Signed)
Subjective:     Patient ID: Shelly [name], female   DOB: 06/25/1946, 72 y.o.   MRN: [id number]  HPI Patient is a nonsmoker seen with cough with onset last Friday. Cough is dry and nonproductive. She's had some postnasal drip and nasal congestion and thinks is maybe allergy related. She visited a brother-in-law last week in [place] who does have a cat. They did not sleep in his house overnight. She takes Claritin at baseline. No fever or chills. No dyspnea. No wheezing.  She also has had some recurrent hives mostly around her waist region. She's had hives in the past. Did he eat quit a bit of seafood over the weekend but no clear shellfish allergy.  Past Medical History:  Diagnosis Date  . Arthritis    wrist, shoulder   . Diverticulitis   . Endometrial polyp   . GERD (gastroesophageal reflux disease)    occasional - diet controlled  . Hx of adenomatous colonic polyps   . Hyperlipidemia   . IBS (irritable bowel syndrome)   . Missed abortion    x 1 - resolved - no surgery  . Osteoporosis   . SVD (spontaneous vaginal delivery)    x 1  . Vitamin D deficiency    Past Surgical History:  Procedure Laterality Date  . COLONOSCOPY    . DIAGNOSTIC LAPAROSCOPY     cysts  . DILATATION & CURETTAGE/HYSTEROSCOPY WITH TRUECLEAR N/A 10/31/2013   Procedure: DILATATION & CURETTAGE/HYSTEROSCOPY WITH TRUCLEAR;  Surgeon: Marylynn Pearson, MD;  Location: [facility];  Service: Gynecology;  Laterality: N/A;  . DILATION AND CURETTAGE OF UTERUS    . TONSILLECTOMY  1952  . WISDOM TOOTH EXTRACTION      reports that she quit smoking about 10 years ago. Her smoking use included cigarettes. She has a 2.00 pack-year smoking history. She has never used smokeless tobacco. She reports that she drinks about 8.4 oz of alcohol per week. She reports that she does not use drugs. family history includes Breast cancer in her maternal aunt; Breast cancer (age of onset: 61) in her cousin; Depression in her brother;  Diabetes in her mother; Heart disease in her other; Hyperlipidemia in her father and mother; Hypertension in her mother and other; Leukemia in her mother; Lung cancer in her father; Uterine cancer in her other and paternal grandmother. Allergies  Allergen Reactions  . Crestor [Rosuvastatin Calcium]     GI upset  . Lipitor [Atorvastatin Calcium] Itching  . Pravastatin     GI upset  . Sulfonamide Derivatives     Hives, itiching     Review of Systems  Constitutional: Negative for chills and fever.  HENT: Positive for congestion and postnasal drip. Negative for sinus pressure and sinus pain.   Respiratory: Positive for cough. Negative for shortness of breath and wheezing.   Cardiovascular: Negative for chest pain.  Skin: Positive for rash.       Objective:   Physical Exam  Constitutional: She appears well-developed and well-nourished.  HENT:  Right Ear: External ear normal.  Left Ear: External ear normal.  Nose: Nose normal.  Mouth/Throat: Oropharynx is clear and moist. No oropharyngeal exudate.  Neck: Neck supple.  Cardiovascular: Normal rate and regular rhythm.  Pulmonary/Chest: Effort normal and breath sounds normal. No stridor. No respiratory distress. She has no wheezes. She has no rales.  Lymphadenopathy:    She has no cervical adenopathy.  Skin: Rash noted.  Patient has a few scattered urticarial lesions on her waist. Sparing of  upper and lower extremities       Assessment:     #1 cough.  Nonfocal exam. Differential is allergic versus viral. Does not have any red flags such as fever, dyspnea, etc.  #2 history of recurrent hives. Etiology unclear. No angioedema    Plan:     -Consider change from Claritin to either Allegra or Xyzal -Consider addition of Pepcid or Zantac for urticaria -Follow-up promptly for any fever, dyspnea, or other concerns  Eulas Post MD [facility]

## 2018-03-18 ENCOUNTER — Telehealth: Payer: Self-pay | Admitting: Family Medicine

## 2018-03-18 MED ORDER — PREDNISONE 10 MG PO TABS: ORAL_TABLET | ORAL | 0 refills | Status: DC

## 2018-03-18 NOTE — Telephone Encounter (Signed)
Rx sent 

## 2018-03-18 NOTE — Telephone Encounter (Signed)
Copied from [place] [id number]. Topic: Inquiry >> Mar 18, 2018  8:37 AM Margot Ables wrote: Reason for CRM: Pt had OV 03/14/18. Pt states she still has wheezing in bronchial tubes. She has coughing spasms as well. Pt states she is on allergy medicines and cough medicine. She is asking if there is something else she should take or if she needs to return for another OV. Please advise. Try home ph # first [phone].

## 2018-03-18 NOTE — Telephone Encounter (Signed)
I recommend prednisone 10 mg taper: 4-4-3-3-2-2 and office follow up next week if no better.

## 2018-03-18 NOTE — Telephone Encounter (Signed)
Pt is willing to use the pcp recommendation, please call in medication

## 2018-03-18 NOTE — Telephone Encounter (Signed)
Patient is willing to follow the recommendation for Prednisone taper by Dr. Elease Hashimoto noted 03/18/18.  Pharmacy: [id number] - [place], [place] - [address] [phone] (Phone) [phone] (Fax)

## 2018-03-23 ENCOUNTER — Ambulatory Visit: Payer: Medicare Other | Admitting: Family Medicine

## 2018-04-05 DIAGNOSIS — L570 Actinic keratosis: Secondary | ICD-10-CM | POA: Diagnosis not present

## 2018-04-05 DIAGNOSIS — L738 Other specified follicular disorders: Secondary | ICD-10-CM | POA: Diagnosis not present

## 2018-04-05 DIAGNOSIS — D229 Melanocytic nevi, unspecified: Secondary | ICD-10-CM | POA: Diagnosis not present

## 2018-04-05 DIAGNOSIS — L821 Other seborrheic keratosis: Secondary | ICD-10-CM | POA: Diagnosis not present

## 2018-04-05 DIAGNOSIS — L819 Disorder of pigmentation, unspecified: Secondary | ICD-10-CM | POA: Diagnosis not present

## 2018-04-05 DIAGNOSIS — L309 Dermatitis, unspecified: Secondary | ICD-10-CM | POA: Diagnosis not present

## 2018-04-05 DIAGNOSIS — L814 Other melanin hyperpigmentation: Secondary | ICD-10-CM | POA: Diagnosis not present

## 2018-04-22 ENCOUNTER — Other Ambulatory Visit: Payer: Self-pay | Admitting: Family Medicine

## 2018-04-22 DIAGNOSIS — Z1231 Encounter for screening mammogram for malignant neoplasm of breast: Secondary | ICD-10-CM

## 2018-05-18 ENCOUNTER — Other Ambulatory Visit: Payer: Self-pay | Admitting: Family Medicine

## 2018-05-18 NOTE — Telephone Encounter (Signed)
Refill once 

## 2018-05-18 NOTE — Telephone Encounter (Signed)
Last fill 01/12/17 Last OV 03/14/18 Ok to fill?

## 2018-05-18 NOTE — Telephone Encounter (Signed)
Rx sent to pharmacy   

## 2018-05-23 ENCOUNTER — Ambulatory Visit
Admission: RE | Admit: 2018-05-23 | Discharge: 2018-05-23 | Disposition: A | Discharge status: Home / Self Care | Payer: Medicare Other | Source: Ambulatory Visit | Attending: Family Medicine | Admitting: Family Medicine

## 2018-05-23 DIAGNOSIS — Z1231 Encounter for screening mammogram for malignant neoplasm of breast: Secondary | ICD-10-CM | POA: Diagnosis not present

## 2018-06-01 ENCOUNTER — Telehealth: Payer: Self-pay | Admitting: *Deleted

## 2018-06-01 NOTE — Telephone Encounter (Signed)
-----   Message from Tomi Likens sent at 05/24/2018 10:57 AM EDT ----- This is the patient I talked about earlier that wants a Prolia injection.  I called and let her know that you would be in touch.  Thanks!

## 2018-06-01 NOTE — Telephone Encounter (Signed)
Left message on machine for patient to call and schedule a Prolia injection. If patient has met her deductible then there is no cost for Prolia.  There may be an admin fee if secondary insurance does not cover, but should. CRM created

## 2018-06-14 NOTE — Telephone Encounter (Signed)
Patient is aware and schedule for a Prolia injection 07/25/18

## 2018-07-25 ENCOUNTER — Ambulatory Visit (INDEPENDENT_AMBULATORY_CARE_PROVIDER_SITE_OTHER): Payer: Medicare Other | Admitting: *Deleted

## 2018-07-25 DIAGNOSIS — M818 Other osteoporosis without current pathological fracture: Secondary | ICD-10-CM

## 2018-07-25 MED ORDER — DENOSUMAB 60 MG/ML ~~LOC~~ SOSY
60.0000 mg | PREFILLED_SYRINGE | Freq: Once | SUBCUTANEOUS | Status: AC
  Administered 2018-07-25: 60 mg via SUBCUTANEOUS

## 2018-07-25 NOTE — Progress Notes (Signed)
Per orders of Dr. Elease Hashimoto, injection of Prolia given by Dorrene German. Patient tolerated injection well.  Patient also wanted to discuss starting a possible exercise program and if working with a trainer would be helpful for her. She has a friend who did a program with a trainer. Advised patient that moderate exercise is recommended and that working with a trainer would be fine if she felt it would be helpful for her. She had several other concerns she wished to discuss, so scheduled follow-up appointment with PCP for 08/05/18.

## 2018-08-05 ENCOUNTER — Ambulatory Visit (INDEPENDENT_AMBULATORY_CARE_PROVIDER_SITE_OTHER): Payer: Medicare Other | Admitting: Family Medicine

## 2018-08-05 ENCOUNTER — Other Ambulatory Visit: Payer: Self-pay

## 2018-08-05 ENCOUNTER — Ambulatory Visit: Payer: Medicare Other

## 2018-08-05 ENCOUNTER — Encounter: Payer: Self-pay | Admitting: Family Medicine

## 2018-08-05 VITALS — BP 142/80 | HR 72 | Temp 98.2°F | Ht 63.25 in | Wt 178.6 lb

## 2018-08-05 DIAGNOSIS — M7062 Trochanteric bursitis, left hip: Secondary | ICD-10-CM | POA: Diagnosis not present

## 2018-08-05 DIAGNOSIS — R030 Elevated blood-pressure reading, without diagnosis of hypertension: Secondary | ICD-10-CM

## 2018-08-05 DIAGNOSIS — Z23 Encounter for immunization: Secondary | ICD-10-CM | POA: Diagnosis not present

## 2018-08-05 DIAGNOSIS — G4700 Insomnia, unspecified: Secondary | ICD-10-CM

## 2018-08-05 DIAGNOSIS — S99922A Unspecified injury of left foot, initial encounter: Secondary | ICD-10-CM

## 2018-08-05 DIAGNOSIS — R7303 Prediabetes: Secondary | ICD-10-CM | POA: Diagnosis not present

## 2018-08-05 LAB — POCT GLYCOSYLATED HEMOGLOBIN (HGB A1C): Hemoglobin A1C: 6.1 % — AB (ref 4.0–5.6)

## 2018-08-05 NOTE — Progress Notes (Signed)
Subjective:     Patient ID: Shelly [name], female   DOB: 1945/12/30, 72 y.o.   MRN: [id number]  HPI  Patient is here to discuss several items today as follows  Recent acute injury. Few days ago she stubbed her left fourth toe. She had some bruising. She is ambulating better and states this is at least 60% improved.  She has history of prediabetes/hyperglycemia. Last A1c 6.3%. No polyuria or polydipsia. She has never been treated with any medications for that.  She has osteoporosis. She's been getting Prolia injections every 6 months. She has gynecologist and has gotten DEXA scans through their office in the past. Needs to set up follow-up for repeat DEXA at this time  Bilateral hip pain left greater than right. She attributes this to recent change of mattress. Location is lateral hip. No difficulty with ambulation.  Insomnia.  Difficulty falling asleep.  No depression issues.  No late day use of caffeine.  Past Medical History:  Diagnosis Date  . Arthritis    wrist, shoulder   . Diverticulitis   . Endometrial polyp   . GERD (gastroesophageal reflux disease)    occasional - diet controlled  . Hx of adenomatous colonic polyps   . Hyperlipidemia   . IBS (irritable bowel syndrome)   . Missed abortion    x 1 - resolved - no surgery  . Osteoporosis   . SVD (spontaneous vaginal delivery)    x 1  . Vitamin D deficiency    Past Surgical History:  Procedure Laterality Date  . COLONOSCOPY    . DIAGNOSTIC LAPAROSCOPY     cysts  . DILATATION & CURETTAGE/HYSTEROSCOPY WITH TRUECLEAR N/A 10/31/2013   Procedure: DILATATION & CURETTAGE/HYSTEROSCOPY WITH TRUCLEAR;  Surgeon: Marylynn Pearson, MD;  Location: [facility];  Service: Gynecology;  Laterality: N/A;  . DILATION AND CURETTAGE OF UTERUS    . TONSILLECTOMY  1952  . WISDOM TOOTH EXTRACTION      reports that she quit smoking about 11 years ago. Her smoking use included cigarettes. She has a 2.00 pack-year smoking history. She has never  used smokeless tobacco. She reports that she drinks about 14.0 standard drinks of alcohol per week. She reports that she does not use drugs. family history includes Breast cancer in her maternal aunt; Breast cancer (age of onset: 78) in her cousin; Depression in her brother; Diabetes in her mother; Heart disease in her other; Hyperlipidemia in her father and mother; Hypertension in her mother and other; Leukemia in her mother; Lung cancer in her father; Uterine cancer in her other and paternal grandmother. Allergies  Allergen Reactions  . Crestor [Rosuvastatin Calcium]     GI upset  . Lipitor [Atorvastatin Calcium] Itching  . Nsaids Other (See Comments)    GI upset  . Pravastatin     GI upset  . Sulfonamide Derivatives     Hives, itiching      Review of Systems  Constitutional: Negative for fatigue.  Eyes: Negative for visual disturbance.  Respiratory: Negative for cough, chest tightness, shortness of breath and wheezing.   Cardiovascular: Negative for chest pain, palpitations and leg swelling.  Endocrine: Negative for polydipsia and polyuria.  Neurological: Negative for dizziness, seizures, syncope, weakness, light-headedness and headaches.       Objective:   Physical Exam  Constitutional: She appears well-developed and well-nourished.  Eyes: Pupils are equal, round, and reactive to light.  Neck: Neck supple. No JVD present. No thyromegaly present.  Cardiovascular: Normal rate and regular rhythm.  Exam reveals no gallop.  Pulmonary/Chest: Effort normal and breath sounds normal. No respiratory distress. She has no wheezes. She has no rales.  Musculoskeletal: She exhibits no edema.  She has excellent range of motion left hip. She has some tenderness over the bursa region.  She has some mild tenderness left fourth toe. No breaks in skin.  Neurological: She is alert.       Assessment:     #1 elevated blood pressure. This has been consistently well controlled around 416  systolic and 38G diastolic at home. Question whitecoat syndrome  #2 history of hyperglycemia/prediabetes  #3 left fourth toe injury. Cannot rule out fracture. We discussed pros and cons of x-ray and at this point she declines.  She is ambulating with no difficulty.  #4 trochanteric bursitis of the hips left greater than right  #5 insomnia    Plan:     -we have asked that she bring her home cuff to compare with ours next visit -We discussed pros and cons of steroid injection left hip and she wants to try some icing first -Flu vaccine given -Recheck A1c -Consider trial of over-the-counter melatonin 1-5 mg daily at bedtime -Recommend good comfortable shoe wear for her toe injury and suspect this will continue to heal  Eulas Post MD [facility]

## 2018-08-05 NOTE — Patient Instructions (Addendum)
Hip Bursitis Hip bursitis is inflammation of a fluid-filled sac (bursa) in the hip joint. The bursa protects the bones in the hip joint from rubbing against each other. Hip bursitis can cause mild to moderate pain, and symptoms often come and go over time. What are the causes? This condition may be caused by:  Injury to the hip.  Overuse of the muscles that surround the hip joint.  Arthritis or gout.  Diabetes.  Thyroid disease.  Cold weather.  Infection.  In some cases, the cause may not be known. What are the signs or symptoms? Symptoms of this condition may include:  Mild or moderate pain in the hip area. Pain may get worse with movement.  Tenderness and swelling of the hip, especially on the outer side of the hip.  Symptoms may come and go. If the bursa becomes infected, you may have the following symptoms:  Fever.  Red skin and a feeling of warmth in the hip area.  How is this diagnosed? This condition may be diagnosed based on:  A physical exam.  Your medical history.  X-rays.  Removal of fluid from your inflamed bursa for testing (biopsy).  You may be sent to a health care provider who specializes in bone diseases (orthopedist) or a provider who specializes in joint inflammation (rheumatologist). How is this treated? This condition is treated by resting, raising (elevating), and applying pressure(compression) to the injured area. In some cases, this may be enough to make your symptoms go away. Treatment may also include:  Crutches.  Antibiotic medicine.  Draining fluid out of the bursa to help relieve swelling.  Injecting medicine that helps to reduce inflammation (cortisone).  Follow these instructions at home: Medicines  Take over-the-counter and prescription medicines only as told by your health care provider.  Do not drive or operate heavy machinery while taking prescription pain medicine, or as told by your health care provider.  If you were  prescribed an antibiotic, take it as told by your health care provider. Do not stop taking the antibiotic even if you start to feel better. Activity  Return to your normal activities as told by your health care provider. Ask your health care provider what activities are safe for you.  Rest and protect your hip as much as possible until your pain and swelling get better. General instructions  Wear compression wraps only as told by your health care provider.  Elevate your hip above the level of your heart as much as you can without pain. To do this, try putting a pillow under your hips while you lie down.  Do not use your hip to support your body weight until your health care provider says that you can. Use crutches as told by your health care provider.  Gently massage and stretch your injured area as often as is comfortable.  Keep all follow-up visits as told by your health care provider. This is important. How is this prevented?  Exercise regularly, as told by your health care provider.  Warm up and stretch before being active.  Cool down and stretch after being active.  If an activity irritates your hip or causes pain, avoid the activity as much as possible.  Avoid sitting down for long periods at a time. Contact a health care provider if:  You have a fever.  You develop new symptoms.  You have difficulty walking or doing everyday activities.  You have pain that gets worse or does not get better with medicine.  You  develop red skin or a feeling of warmth in your hip area. Get help right away if:  You cannot move your hip.  You have severe pain. This information is not intended to replace advice given to you by your health care provider. Make sure you discuss any questions you have with your health care provider. Document Released: 04/24/2002 Document Revised: 04/09/2016 Document Reviewed: 06/04/2015 Elsevier Interactive Patient Education  2018 [place]  your BP cuff here to compare with ours at follow up.  Check with pharmacy regarding shingles vaccine

## 2018-08-08 ENCOUNTER — Telehealth: Payer: Self-pay | Admitting: Family Medicine

## 2018-08-08 ENCOUNTER — Other Ambulatory Visit: Payer: Self-pay

## 2018-08-08 DIAGNOSIS — S99922A Unspecified injury of left foot, initial encounter: Secondary | ICD-10-CM

## 2018-08-08 NOTE — Telephone Encounter (Signed)
Go ahead and get X-ray of left 4th toe.  Make sure Jinny Blossom or someone that can do X-ray is available.

## 2018-08-08 NOTE — Telephone Encounter (Signed)
Called patient and let her know that the xray order has been placed and patient stated that she will come in tomorrow for her xray.

## 2018-08-08 NOTE — Telephone Encounter (Signed)
Copied from [place] [id number]. Topic: Inquiry >> Aug 08, 2018  9:21 AM Conception Chancy, NT wrote: Reason for CRM: patient is calling and was seen on 08/05/18 and states she thought a toe was broke on her left foot and Dr. Elease Hashimoto told her she could get a xray. She states that she did not get it that day but would like to come and get the xray today. She would like to know what is a good time for her to come today. Please contactt.

## 2018-08-08 NOTE — Telephone Encounter (Signed)
Last OV 08/05/18, No future OV  Please advise.

## 2018-08-09 ENCOUNTER — Ambulatory Visit (INDEPENDENT_AMBULATORY_CARE_PROVIDER_SITE_OTHER): Payer: Medicare Other

## 2018-08-09 DIAGNOSIS — S99922A Unspecified injury of left foot, initial encounter: Secondary | ICD-10-CM | POA: Diagnosis not present

## 2018-08-09 DIAGNOSIS — S62645A Nondisplaced fracture of proximal phalanx of left ring finger, initial encounter for closed fracture: Secondary | ICD-10-CM | POA: Diagnosis not present

## 2018-08-18 DIAGNOSIS — Z124 Encounter for screening for malignant neoplasm of cervix: Secondary | ICD-10-CM | POA: Diagnosis not present

## 2018-08-18 DIAGNOSIS — Z6830 Body mass index (BMI) 30.0-30.9, adult: Secondary | ICD-10-CM | POA: Diagnosis not present

## 2018-09-01 ENCOUNTER — Encounter: Payer: Self-pay | Admitting: Family Medicine

## 2018-09-01 DIAGNOSIS — M8588 Other specified disorders of bone density and structure, other site: Secondary | ICD-10-CM | POA: Diagnosis not present

## 2018-09-01 DIAGNOSIS — N958 Other specified menopausal and perimenopausal disorders: Secondary | ICD-10-CM | POA: Diagnosis not present

## 2018-09-01 LAB — HM DEXA SCAN

## 2018-10-31 DIAGNOSIS — H04123 Dry eye syndrome of bilateral lacrimal glands: Secondary | ICD-10-CM | POA: Diagnosis not present

## 2018-10-31 DIAGNOSIS — H5203 Hypermetropia, bilateral: Secondary | ICD-10-CM | POA: Diagnosis not present

## 2018-10-31 DIAGNOSIS — H52223 Regular astigmatism, bilateral: Secondary | ICD-10-CM | POA: Diagnosis not present

## 2018-10-31 DIAGNOSIS — H2513 Age-related nuclear cataract, bilateral: Secondary | ICD-10-CM | POA: Diagnosis not present

## 2018-10-31 DIAGNOSIS — H524 Presbyopia: Secondary | ICD-10-CM | POA: Diagnosis not present

## 2018-11-17 ENCOUNTER — Emergency Department (HOSPITAL_COMMUNITY)
Admission: EM | Admit: 2018-11-17 | Discharge: 2018-11-17 | Disposition: A | Discharge status: Home / Self Care | Payer: Medicare Other | Attending: Emergency Medicine | Admitting: Emergency Medicine

## 2018-11-17 ENCOUNTER — Emergency Department (HOSPITAL_COMMUNITY): Payer: Medicare Other

## 2018-11-17 ENCOUNTER — Ambulatory Visit: Payer: Self-pay | Admitting: *Deleted

## 2018-11-17 ENCOUNTER — Encounter (HOSPITAL_COMMUNITY): Payer: Self-pay | Admitting: *Deleted

## 2018-11-17 DIAGNOSIS — H539 Unspecified visual disturbance: Secondary | ICD-10-CM

## 2018-11-17 DIAGNOSIS — Z87891 Personal history of nicotine dependence: Secondary | ICD-10-CM | POA: Insufficient documentation

## 2018-11-17 DIAGNOSIS — H538 Other visual disturbances: Secondary | ICD-10-CM | POA: Diagnosis not present

## 2018-11-17 DIAGNOSIS — Z79899 Other long term (current) drug therapy: Secondary | ICD-10-CM | POA: Insufficient documentation

## 2018-11-17 DIAGNOSIS — G459 Transient cerebral ischemic attack, unspecified: Secondary | ICD-10-CM | POA: Diagnosis not present

## 2018-11-17 LAB — COMPREHENSIVE METABOLIC PANEL
ALT: 19 U/L (ref 0–44)
AST: 18 U/L (ref 15–41)
Albumin: 4.1 g/dL (ref 3.5–5.0)
Alkaline Phosphatase: 64 U/L (ref 38–126)
Anion gap: 11 (ref 5–15)
BUN: 13 mg/dL (ref 8–23)
CO2: 24 mmol/L (ref 22–32)
Calcium: 9.3 mg/dL (ref 8.9–10.3)
Chloride: 105 mmol/L (ref 98–111)
Creatinine, Ser: 0.84 mg/dL (ref 0.44–1.00)
GFR calc Af Amer: >60 mL/min (ref >60)
GFR calc non Af Amer: >60 mL/min (ref >60)
Glucose, Bld: 105 mg/dL — ABNORMAL HIGH (ref 70–99)
Potassium: 4.1 mmol/L (ref 3.5–5.1)
Sodium: 140 mmol/L (ref 135–145)
Total Bilirubin: 0.2 mg/dL — ABNORMAL LOW (ref 0.3–1.2)
Total Protein: 7.3 g/dL (ref 6.5–8.1)

## 2018-11-17 LAB — CBC
HCT: 47 % — ABNORMAL HIGH (ref 36.0–46.0)
Hemoglobin: 14.7 g/dL (ref 12.0–15.0)
MCH: 28.1 pg (ref 26.0–34.0)
MCHC: 31.3 g/dL (ref 30.0–36.0)
MCV: 89.7 fL (ref 80.0–100.0)
Platelets: 308 10*3/uL (ref 150–400)
RBC: 5.24 MIL/uL — ABNORMAL HIGH (ref 3.87–5.11)
RDW: 13.7 % (ref 11.5–15.5)
WBC: 6.9 10*3/uL (ref 4.0–10.5)
nRBC: 0 % (ref 0.0–0.2)

## 2018-11-17 LAB — RAPID URINE DRUG SCREEN, HOSP PERFORMED
Amphetamines: NOT DETECTED
Barbiturates: NOT DETECTED
Benzodiazepines: NOT DETECTED
Cocaine: NOT DETECTED
Opiates: NOT DETECTED
Tetrahydrocannabinol: NOT DETECTED

## 2018-11-17 NOTE — ED Provider Notes (Signed)
[place] [facility] EMERGENCY DEPARTMENT Provider Note   CSN: [id number] Arrival date & time: 11/17/18  1028     History   Chief Complaint Chief Complaint  Patient presents with  . Transient Ischemic Attack    HPI Shelly [name] is a 73 y.o. female.  The history is provided by the patient. No language interpreter was used.     73 year old female with history of hyperlipidemia, osteoporosis, presenting complaining of transient blurry vision.  Patient reports 2 days ago after having dinner, patient went home, and was laying next to a fireplace to rest, after a short period time when she open her eyes, she noticed her vision was blurry.  It was lasting for approximately 4 minutes and then resolved.  Initially she thought about going to the hospital but talked herself out of it.  The more she thought about it she decided to come here today for further evaluation.  She did admits to drinking 2 glasses of wine prior to the incident and states she was drinking a new one that was stronger than usual.  She did not report called any significant headache, focal weakness, pain in her chest, or diplopia during her episode.  She has never had this problem before.  She does not wear glasses or contact lenses but did report recently diagnosed with a cataract on her right eye which she will follow-up with ophthalmology in the near future.  She does not take any anticoagulant.  She is currently symptom-free.  No recent medication changes.  No history of diabetes.  Past Medical History:  Diagnosis Date  . Arthritis    wrist, shoulder   . Diverticulitis   . Endometrial polyp   . GERD (gastroesophageal reflux disease)    occasional - diet controlled  . Hx of adenomatous colonic polyps   . Hyperlipidemia   . IBS (irritable bowel syndrome)   . Missed abortion    x 1 - resolved - no surgery  . Osteoporosis   . SVD (spontaneous vaginal delivery)    x 1  . Vitamin D deficiency     Patient  Active Problem List   Diagnosis Date Noted  . Prediabetes 04/06/2017  . Osteoporosis 06/12/2015  . IBS (irritable bowel syndrome) 08/21/2014  . Hyperlipidemia 08/05/2011  . ABDOMINAL PAIN RIGHT UPPER QUADRANT 10/30/2008  . ABDOMINAL BLOATING 10/25/2008  . GERD 06/04/2008  . DIVERTICULOSIS OF COLON 06/04/2008    Past Surgical History:  Procedure Laterality Date  . COLONOSCOPY    . DIAGNOSTIC LAPAROSCOPY     cysts  . DILATATION & CURETTAGE/HYSTEROSCOPY WITH TRUECLEAR N/A 10/31/2013   Procedure: DILATATION & CURETTAGE/HYSTEROSCOPY WITH TRUCLEAR;  Surgeon: Marylynn Pearson, MD;  Location: [facility];  Service: Gynecology;  Laterality: N/A;  . DILATION AND CURETTAGE OF UTERUS    . TONSILLECTOMY  1952  . WISDOM TOOTH EXTRACTION       OB History    Gravida  2   Para  1   Term      Preterm      AB  1   Living  1     SAB  1   TAB      Ectopic      Multiple      Live Births               Home Medications    Prior to Admission medications   Medication Sig Start Date End Date Taking? Authorizing Provider  acetaminophen (TYLENOL) 500 MG tablet  Take 500 mg by mouth every 6 (six) hours as needed for headache.    [provider]  clidinium-chlordiazePOXIDE (LIBRAX) 5-2.5 MG capsule TAKE ONE CAPSULE BY MOUTH EVERY 8 HOURS AS NEEDED 05/18/18   Burchette, Alinda Sierras, MD  denosumab (PROLIA) 60 MG/ML SOSY injection Inject 60 mg into the skin every 6 (six) months.    [provider]  esomeprazole (NEXIUM) 20 MG capsule Take 20 mg by mouth daily at 12 noon.    [provider]  Fluocinolone Acetonide 0.01 % SHAM Apply 1 application topically daily as needed. 10/15/15   Burchette, Alinda Sierras, MD  loratadine (CLARITIN) 10 MG tablet Take 10 mg by mouth.    [provider]  LORazepam (ATIVAN) 0.5 MG tablet Take one tablet by mouth one hour before appointment prn anxiety. Ensure that someone drives you to the appointment. 02/19/17   Burchette, Alinda Sierras, MD    triamcinolone cream (KENALOG) 0.1 % APPLY 1 APPLICATION TOPICALLY 2 (TWO) TIMES DAILY AS NEEDED. 03/04/17   Burchette, Alinda Sierras, MD  omeprazole (PRILOSEC) 40 MG capsule Take 40 mg by mouth daily.    01/25/12  [provider]  pravastatin (PRAVACHOL) 40 MG tablet Take 1 tablet (40 mg total) by mouth every evening. 09/18/11 01/25/12  Burchette, Alinda Sierras, MD    Family History Family History  Problem Relation Age of Onset  . Hyperlipidemia Mother   . Hypertension Mother   . Diabetes Mother        type ll  . Leukemia Mother   . Lung cancer Father   . Hyperlipidemia Father   . Breast cancer Cousin 30  . Uterine cancer Other        grandmother  . Heart disease Other   . Hypertension Other   . Depression Brother   . Breast cancer Maternal Aunt   . Uterine cancer Paternal Grandmother     Social History Social History   Tobacco Use  . Smoking status: Former Smoker    Packs/day: 0.10    Years: 20.00    Pack years: 2.00    Types: Cigarettes    Last attempt to quit: 05/15/2007    Years since quitting: 11.5  . Smokeless tobacco: Never Used  Substance Use Topics  . Alcohol use: Yes    Alcohol/week: 14.0 standard drinks    Types: 14 Glasses of wine per week    Comment: 2 glasses wine every evening  . Drug use: No     Allergies   Crestor [rosuvastatin calcium]; Lipitor [atorvastatin calcium]; Nsaids; Pravastatin; and Sulfonamide derivatives   Review of Systems Review of Systems  All other systems reviewed and are negative.    Physical Exam Updated Vital Signs BP (!) 161/83 (BP Location: Right Arm)   Pulse 79   Temp 98 F (36.7 C) (Oral)   Resp 18   SpO2 92%   Physical Exam Vitals signs and nursing note reviewed.  Constitutional:      General: She is not in acute distress.    Appearance: She is well-developed.  HENT:     Head: Atraumatic.     Right Ear: Tympanic membrane normal.     Left Ear: Tympanic membrane normal.     Nose: Nose normal.  Eyes:      Extraocular Movements: Extraocular movements intact.     Conjunctiva/sclera: Conjunctivae normal.     Pupils: Pupils are equal, round, and reactive to light.     Comments: Both eyes 20/70, RT 20/50, LT 20/70  read correctly.   Neck:     Musculoskeletal: Neck supple. No muscular tenderness.     Vascular: No carotid bruit.  Cardiovascular:     Rate and Rhythm: Normal rate and regular rhythm.  Pulmonary:     Effort: Pulmonary effort is normal.     Breath sounds: Normal breath sounds.  Abdominal:     Palpations: Abdomen is soft.     Tenderness: There is no abdominal tenderness.  Musculoskeletal:     Comments: 5 out of 5 strength all 4 extremities.  Skin:    Findings: No rash.  Neurological:     Mental Status: She is alert and oriented to person, place, and time.     Comments: Neurologic exam:  Speech clear, pupils equal round reactive to light, extraocular movements intact  Normal peripheral visual fields Cranial nerves III through XII normal including no facial droop Follows commands, moves all extremities x4, normal strength to bilateral upper and lower extremities at all major muscle groups including grip Sensation normal to light touch and pinprick Coordination intact, no limb ataxia, finger-nose-finger normal Rapid alternating movements normal No pronator drift Gait normal       ED Treatments / Results  Labs (all labs ordered are listed, but only abnormal results are displayed) Labs Reviewed  CBC - Abnormal; Notable for the following components:      Result Value   RBC 5.24 (*)    HCT 47.0 (*)    All other components within normal limits  COMPREHENSIVE METABOLIC PANEL - Abnormal; Notable for the following components:   Glucose, Bld 105 (*)    Total Bilirubin 0.2 (*)    All other components within normal limits  RAPID URINE DRUG SCREEN, HOSP PERFORMED    EKG None   Date: 11/17/2018  Rate: 66  Rhythm: normal sinus rhythm  QRS Axis: normal  Intervals: normal   ST/T Wave abnormalities: normal  Conduction Disutrbances: none  Narrative Interpretation:   Old EKG Reviewed: No significant changes noted     Radiology Ct Head Wo Contrast  Result Date: 11/17/2018 CLINICAL DATA:  Blurred vision. EXAM: CT HEAD WITHOUT CONTRAST TECHNIQUE: Contiguous axial images were obtained from the base of the skull through the vertex without intravenous contrast. COMPARISON:  None. FINDINGS: Brain: Mild chronic ischemic white matter disease is noted. No mass effect or midline shift is noted. Ventricular size is within normal limits. There is no evidence of mass lesion, hemorrhage or acute infarction. Vascular: No hyperdense vessel or unexpected calcification. Skull: Normal. Negative for fracture or focal lesion. Sinuses/Orbits: Left maxillary mucous retention cyst is noted. Other: None. IMPRESSION: Mild chronic ischemic white matter disease. No acute intracranial abnormality seen. Electronically Signed   By: Marijo Conception, M.D.   On: 11/17/2018 13:08    Procedures Procedures (including critical care time)  Medications Ordered in ED Medications - No data to display   Initial Impression / Assessment and Plan / ED Course  I have reviewed the triage vital signs and the nursing notes.  Pertinent labs & imaging results that were available during my care of the patient were reviewed by me and considered in my medical decision making (see chart for details).     BP (!) 157/71   Pulse 66   Temp 98 F (36.7 C) (Oral)   Resp 16   SpO2 98%    Final Clinical Impressions(s) / ED Diagnoses   Final diagnoses:  Change in vision    ED Discharge Orders  None     12:50 PM Pt here with transient blurry vision and dizziness. Sxs more likely vertigo and less likely TIA.  Her ABCD2 score is 2, which puts her at low stroke risk.  Care discussed with Dr. Lacinda Axon.    2:33 PM Labs are reassuring, normal electrolytes panel, normal WBC, normal H&H, EKG without concerning  arrhythmia or ischemic changes, head CT scan without acute intracranial abnormalities.      Domenic Moras, PA-C 11/17/18 1500    Nat Christen, MD 11/19/18 [id number]

## 2018-11-17 NOTE — ED Notes (Signed)
Patient verbalizes understanding of discharge instructions. Opportunity for questioning and answers were provided. 

## 2018-11-17 NOTE — ED Triage Notes (Signed)
Pt in c/o episode of blurred vision that occurred on Saturday night, symptoms resolved shortly after, reports continued blurred vision specific to her right eye but that is not new for her, denies focal weakness with this episode, no distress noted, no deficits at this time

## 2018-11-17 NOTE — Telephone Encounter (Signed)
Agree with advice given

## 2018-11-17 NOTE — ED Notes (Signed)
Performed visual acuity, pt started to recite numbers for some of the letters.  Both eyes 20/70, RT 20/50, LT 20/70 read correctly.

## 2018-11-17 NOTE — Discharge Instructions (Signed)
Please follow up with your doctor for further evaluation of your transient visual disturbances.  Return if you have any concerns.

## 2018-11-17 NOTE — Telephone Encounter (Signed)
FYI

## 2018-11-17 NOTE — Telephone Encounter (Signed)
Patient experienced stroke-like symptoms on 11/14/18 in the evening including: decreased and blurred vision, dizziness, left-sided weakness. These symptoms lasted approximately 45 minutes and then resolved. None since. No arm drift/face drooping/speech changes reported. Her b/p today is 173/66. Reports she is not on antihypertensive medication. I have referred her to [facility] ER for evaluation.   Reason for Disposition . [1] MODERATE dizziness (e.g., vertigo; feels very unsteady, interferes with normal activities) AND [2] has NOT been evaluated by physician for this  Answer Assessment - Initial Assessment Questions 1. DESCRIPTION: "Describe your dizziness."     Blurred vision and couldn't get balance 2. VERTIGO: "Do you feel like either you or the room is spinning or tilting?"      yes 3. LIGHTHEADED: "Do you feel lightheaded?" (e.g., somewhat faint, woozy, weak upon standing)     Yes, felt like should could not stand up. 4. SEVERITY: "How bad is it?"  "Can you walk?"   - MILD - Feels unsteady but walking normally.   - MODERATE - Feels very unsteady when walking, but not falling; interferes with normal activities (e.g., school, work) .   - SEVERE - Unable to walk without falling (requires assistance).     Moderate at that time. 5. ONSET:  "When did the dizziness begin?"     2 days ago. 6. AGGRAVATING FACTORS: "Does anything make it worse?" (e.g., standing, change in head position)    none 7. CAUSE: "What do you think is causing the dizziness?"     unsure 8. RECURRENT SYMPTOM: "Have you had dizziness before?" If so, ask: "When was the last time?" "What happened that time?"     no 9. OTHER SYMPTOMS: "Do you have any other symptoms?" (e.g., headache, weakness, numbness, vomiting, earache)     weakness 10. PREGNANCY: "Is there any chance you are pregnant?" "When was your last menstrual period?"       no  Protocols used: DIZZINESS - VERTIGO-A-AH

## 2018-12-08 DIAGNOSIS — H81399 Other peripheral vertigo, unspecified ear: Secondary | ICD-10-CM | POA: Diagnosis not present

## 2018-12-08 DIAGNOSIS — Z0101 Encounter for examination of eyes and vision with abnormal findings: Secondary | ICD-10-CM | POA: Diagnosis not present

## 2018-12-08 DIAGNOSIS — H5016 Alternating exotropia with A pattern: Secondary | ICD-10-CM | POA: Diagnosis not present

## 2018-12-08 DIAGNOSIS — H25813 Combined forms of age-related cataract, bilateral: Secondary | ICD-10-CM | POA: Diagnosis not present

## 2018-12-20 ENCOUNTER — Telehealth: Payer: Self-pay | Admitting: *Deleted

## 2018-12-20 NOTE — Telephone Encounter (Signed)
Copied from [place]. Topic: General - Other >> Dec 20, 2018 11:13 AM Oneta Rack wrote: Relation to pt: self  Call back number: [phone] / [phone] Pharmacy:  Reason for call:  Patient would to schedule Prolia injection but would like confirmation Proliais covered, please advise

## 2018-12-23 NOTE — Telephone Encounter (Signed)
Prolia injection scheduled for 01/24/2019.

## 2018-12-23 NOTE — Telephone Encounter (Addendum)
Waiting on insurance verification.  Injection is due after 01/23/2019

## 2019-01-10 ENCOUNTER — Ambulatory Visit: Payer: Medicare Other

## 2019-01-20 ENCOUNTER — Telehealth: Payer: Self-pay

## 2019-01-20 NOTE — Telephone Encounter (Signed)
Author noticed pt. had cancelled awv for 3/9, but has upcoming NV appointment on 3/10. Author phoned pt. to offer to see her on 3/10 instead. Pt. stated she did not want to stay in doctor's office for long d/t concern with coronavirus spread. Author educated her on basic precautions, and pt. Appreciative, still wanting to keep her 3/10 appointment for prolia though. Pt rescheduled awv for 4/22.

## 2019-01-23 ENCOUNTER — Ambulatory Visit: Payer: Medicare Other

## 2019-01-24 ENCOUNTER — Ambulatory Visit (INDEPENDENT_AMBULATORY_CARE_PROVIDER_SITE_OTHER): Payer: Medicare Other | Admitting: *Deleted

## 2019-01-24 DIAGNOSIS — M818 Other osteoporosis without current pathological fracture: Secondary | ICD-10-CM | POA: Diagnosis not present

## 2019-01-24 MED ORDER — DENOSUMAB 60 MG/ML ~~LOC~~ SOSY
60.0000 mg | PREFILLED_SYRINGE | Freq: Once | SUBCUTANEOUS | Status: AC
  Administered 2019-01-24: 60 mg via SUBCUTANEOUS

## 2019-01-24 NOTE — Progress Notes (Signed)
Per orders of Dr. Elease Hashimoto, injection of Prolia given by Virl Cagey. Patient tolerated injection well.

## 2019-02-07 ENCOUNTER — Telehealth: Payer: Self-pay

## 2019-02-07 NOTE — Telephone Encounter (Signed)
Author phoned pt. to offer to do virtual AWV. Pt. Stated she would be willing to do. Appointment scheduled for 3/27 at [facility].

## 2019-02-09 NOTE — Progress Notes (Signed)
Subjective:   Shelly [name] is a 73 y.o. female who presents for Medicare Annual (Subsequent) preventive examination.  I connected with Shelly [name] on 02/10/19 at  2:00 PM EDT by a video enabled telemedicine application and verified that I am speaking with the correct person using two identifiers.   Review of Systems:  No ROS.  Medicare Wellness Visit. Additional risk factors are reflected in the social history.  Cardiac Risk Factors include: dyslipidemia;advanced age (>96men, >48 women);sedentary lifestyle Sleep patterns: has interrupted sleep and gets up 3 times nightly to void. Relaxation and deep breathing techniques discussed. EMMI education on sleep hygiene to be provided via mail. Discussed possible use of melatonin OTC, will mention to PCP.   Home Safety/Smoke Alarms: Feels safe in home. Smoke alarms in place.  Living environment; residence and Firearm Safety: 2-story house. No use or need for DME at this time.  Seat Belt Safety/Bike Helmet: Wears seat belt.   Female:   Pap- N/A d/t age       66- 78/2019, due after 05/24/2019      Dexa scan- 06/2015, overdue. Had through OBGYN at 2019 per pt; report requested to be faxed.      CCS- 08/2014, repeat 08/2019.      Objective:     Vitals: There were no vitals taken for this visit.  There is no height or weight on file to calculate BMI.  Advanced Directives 02/10/2019 01/07/2018 06/16/2017 09/12/2014 10/27/2013  Does Patient Have a Medical Advance Directive? (No Data) Yes Yes Yes Patient has advance directive, copy not in chart  Type of Advance Directive - - Living will [place];Living will Living will  Does patient want to make changes to medical advance directive? - - No - Patient declined - No  Copy of Healthcare Power of Attorney in Chart? - - - - Copy requested from family    Tobacco Social History   Tobacco Use  Smoking Status Former Smoker  . Packs/day: 0.10  . Years: 20.00  . Pack years:  2.00  . Types: Cigarettes  . Last attempt to quit: 05/15/2007  . Years since quitting: 11.7  Smokeless Tobacco Never Used     Counseling given: Not Answered    Past Medical History:  Diagnosis Date  . Arthritis    wrist, shoulder   . Diverticulitis   . Endometrial polyp   . GERD (gastroesophageal reflux disease)    occasional - diet controlled  . Hx of adenomatous colonic polyps   . Hyperlipidemia   . IBS (irritable bowel syndrome)   . Missed abortion    x 1 - resolved - no surgery  . Osteoporosis   . SVD (spontaneous vaginal delivery)    x 1  . Transient vision disturbance 2019   was evaluated by ED, no findings  . Vitamin D deficiency    Past Surgical History:  Procedure Laterality Date  . COLONOSCOPY    . DIAGNOSTIC LAPAROSCOPY     cysts  . DILATATION & CURETTAGE/HYSTEROSCOPY WITH TRUECLEAR N/A 10/31/2013   Procedure: DILATATION & CURETTAGE/HYSTEROSCOPY WITH TRUCLEAR;  Surgeon: Marylynn Pearson, MD;  Location: [facility];  Service: Gynecology;  Laterality: N/A;  . DILATION AND CURETTAGE OF UTERUS    . TONSILLECTOMY  1952  . WISDOM TOOTH EXTRACTION     Family History  Problem Relation Age of Onset  . Hyperlipidemia Mother   . Hypertension Mother   . Diabetes Mother        type ll  .  Leukemia Mother   . Lung cancer Father   . Hyperlipidemia Father   . Breast cancer Cousin 30  . Uterine cancer Other        grandmother  . Heart disease Other   . Hypertension Other   . Cancer Other   . Depression Brother   . Breast cancer Maternal Aunt   . Cancer Maternal Aunt   . Uterine cancer Paternal Grandmother    Social History   Socioeconomic History  . Marital status: Married    Spouse name: Not on file  . Number of children: 1  . Years of education: Not on file  . Highest education level: Not on file  Occupational History  . Not on file  Social Needs  . Financial resource strain: Not hard at all  . Food insecurity:    Worry: Never true    Inability: Never  true  . Transportation needs:    Medical: No    Non-medical: No  Tobacco Use  . Smoking status: Former Smoker    Packs/day: 0.10    Years: 20.00    Pack years: 2.00    Types: Cigarettes    Last attempt to quit: 05/15/2007    Years since quitting: 11.7  . Smokeless tobacco: Never Used  Substance and Sexual Activity  . Alcohol use: Yes    Alcohol/week: 14.0 standard drinks    Types: 14 Glasses of wine per week    Comment: 2 glasses wine every evening  . Drug use: No  . Sexual activity: Yes    Birth control/protection: Post-menopausal  Lifestyle  . Physical activity:    Days per week: 0 days    Minutes per session: 0 min  . Stress: Rather much  Relationships  . Social connections:    Talks on phone: Three times a week    Gets together: Once a week    Attends religious service: Not on file    Active member of club or organization: Not on file    Attends meetings of clubs or organizations: Not on file    Relationship status: Married  Other Topics Concern  . Not on file  Social History Narrative   Lives with husband in 2 story house   Has one daughter, with 3 grandchildren, local, supportive   Enjoys gardening/walking outside with husband   Occasionally does home exercise calisthenics but not recently.   Well-balanced diet overall    Outpatient Encounter Medications as of 02/10/2019  Medication Sig  . acetaminophen (TYLENOL) 500 MG tablet Take 500 mg by mouth every 6 (six) hours as needed for headache.  . bismuth subsalicylate (PEPTO BISMOL) 262 MG/15ML suspension Take 30 mLs by mouth every 6 (six) hours as needed.  . clidinium-chlordiazePOXIDE (LIBRAX) 5-2.5 MG capsule TAKE ONE CAPSULE BY MOUTH EVERY 8 HOURS AS NEEDED (Patient taking differently: Take 1 capsule by mouth every 8 (eight) hours as needed (bowels). )  . denosumab (PROLIA) 60 MG/ML SOSY injection Inject 60 mg into the skin every 6 (six) months.  . esomeprazole (NEXIUM) 20 MG capsule Take 20 mg by mouth daily as  needed (acid reflux).   Marland Kitchen loratadine (CLARITIN) 10 MG tablet Take 10 mg by mouth daily as needed for allergies.   . [DISCONTINUED] omeprazole (PRILOSEC) 40 MG capsule Take 40 mg by mouth daily.    . [DISCONTINUED] pravastatin (PRAVACHOL) 40 MG tablet Take 1 tablet (40 mg total) by mouth every evening.   No facility-administered encounter medications on file as of 02/10/2019.  Activities of Daily Living In your present state of health, do you have any difficulty performing the following activities: 02/10/2019  Hearing? N  Vision? N  Difficulty concentrating or making decisions? N  Walking or climbing stairs? N  Dressing or bathing? N  Doing errands, shopping? N  Preparing Food and eating ? N  Using the Toilet? N  In the past six months, have you accidently leaked urine? N  Do you have problems with loss of bowel control? N  Managing your Medications? N  Managing your Finances? N  Housekeeping or managing your Housekeeping? N  Some recent data might be hidden    Patient Care Team: Eulas Post, MD as PCP - General (Family Medicine) Barbaraann Cao, OD as Referring Physician (Optometry)    Assessment:   This is a routine wellness examination for Jehieli. Physical assessment deferred to PCP.   Exercise Activities and Dietary recommendations Current Exercise Habits: Home exercise routine, Type of exercise: calisthenics, Time (Minutes): 20, Frequency (Times/Week): 3, Weekly Exercise (Minutes/Week): 60, Intensity: Mild, Exercise limited by: cardiac condition(s);psychological condition(s) Diet (meal preparation, eat out, water intake, caffeinated beverages, dairy products, fruits and vegetables): in general, a "healthy" diet  . Tries to watch cholesterol, likes to cook. IBS diet reviewed with pt.    Goals    . Patient Stated     Start doing yoga, deep breathing exercises routinely    . Weight (lb) < 165 lb (74.8 kg)     Went on weight watchers in the past and walking  more during the day Cutting back on food; cut back on butter;  Continue DB exercises - part of losing weight is relaxation     Serving Sizes A serving size is a measured amount of food or drink, such as one slice of bread, that has an associated nutrient content. Knowing the serving size of a food or drink can help you determine how much of that food you should consume. What is the size of one serving? The size of one healthy serving depends on the food or drink. To determine a serving size, read the food label. If the food or drink does not have a food label, try to find serving size information online. Or, use the following to estimate the size of one adult serving: Grain 1 slice bread.  bagel.  cup pasta. Vegetable  cup cooked or canned vegetables. 1 cup raw, leafy greens. Fruit  cup canned fruit. 1 medium fruit.  cup dried fruit. Meat and Other Protein Sources 1 oz meat, poultry, or fish.  cup cooked beans. 1 egg.  cup nuts or seeds. 1 Tbsp nut butter.  cup tofu or tempeh. 2 Tbsp hummus. Dairy An individual container of yogurt (6-8 oz). 1 piece of cheese the size of your thumb (1 oz). 1 cup (8 oz) milk or milk alternative. Fat A piece the size of one dice. 1 tsp soft margarine. 1 Tbsp mayonnaise. 1 tsp vegetable oil. 1 Tbsp regular salad dressing. 2 Tbsp low-fat salad dressing. How many servings should I eat from each food group each day? The following are the suggested number of servings to try and have every day from each food group. You can also look at your eating throughout the week and aim for meeting these requirements on most days for overall healthy eating. Grain 6-8 servings. Try to have half of your grains from whole grains, such as whole wheat bread, corn tortillas, oatmeal, brown rice, whole wheat pasta, and bulgur.  Vegetable At least 2-3 servings. Fruit 2 servings. Meat and Other Protein Foods 5-6 servings. Aim to have lean proteins, such as chicken, [place],  fish, beans, or tofu. Dairy 3 servings. Choose low-fat or nonfat if you are trying to control your weight. Fat 2-3 servings. Is a serving the same thing as a portion? No. A portion is the actual amount you eat, which may be more than one serving. Knowing the specific serving size of a food and the nutritional information that goes with it can help you make a healthy decision on what size portion to eat. What are some tips to help me learn healthy serving sizes?  Check food labels for serving sizes. Many foods that come as a single portion actually contain multiple servings.  Determine the serving size of foods you commonly eat and figure out how large a portion you usually eat.  Measure the number of servings that can be held by the bowls, glasses, cups, and plates you typically use. For example, pour your breakfast cereal into your regular bowl and then pour it into a measuring cup.  For 1-2 days, measure the serving sizes of all the foods you eat.  Practice estimating serving sizes and determining how big your portions should be. This information is not intended to replace advice given to you by your health care provider. Make sure you discuss any questions you have with your health care provider. Document Released: 08/01/2003 Document Revised: 06/27/2016 Document Reviewed: 01/30/2014 Elsevier Interactive Patient Education  2018 [place]  02/10/2019 01/07/2018 10/21/2017 05/18/2016 10/15/2015  Falls in the past year? 0 No No No No  Comment - - Emmi Telephone Survey: data to providers prior to load - -  Risk for fall due to : History of fall(s) - - - -  Follow up Education provided;Falls prevention discussed - - - -    Depression Screen PHQ 2/9 Scores 02/10/2019 01/07/2018 06/14/2017 05/18/2016  PHQ - 2 Score 1 0 0 0  PHQ- 9 Score 5 - - -   With recent pandemic, discussed her concerns. Pt. Very anxious about many things, especially now with social  isolation/risk of infection. Author reassured pt. That she is doing everything she can and that most people do recover well from covid from what we know. Pt. Declined formal counseling, but will provide pt. With behavioral health contact in event she feels like she needs.   Cognitive Function MMSE - Mini Mental State Exam 01/07/2018  Not completed: (No Data)       Ad8 score reviewed for issues:  Issues making decisions: no  Less interest in hobbies / activities: no  Repeats questions, stories (family complaining): yes  Trouble using ordinary gadgets (microwave, computer, phone):no  Forgets the month or year: no  Mismanaging finances: no  Remembering appts: no  Daily problems with thinking and/or memory: no Ad8 score is= 1    Immunization History  Administered Date(s) Administered  . Influenza Split 08/05/2011, 07/28/2012  . Influenza, High Dose Seasonal PF 07/30/2015, 08/07/2016, 08/11/2017, 08/05/2018  . Influenza,inj,Quad PF,6+ Mos 08/01/2013, 07/18/2014  . Pneumococcal Conjugate-13 05/14/2014  . Pneumococcal Polysaccharide-23 06/05/2011  . Td 12/15/2003  . Tdap 05/14/2014  . Zoster 03/13/2013    Qualifies for Shingles Vaccine? Yes, pt. Stated that once pandemic is over, she will go back to pharmacy to receive.   Screening Tests Health Maintenance  Topic Date Due  . Hepatitis C Screening  02/10/2020 (Originally [birth year])  . COLONOSCOPY  09/13/2019  . MAMMOGRAM  05/23/2020  . TETANUS/TDAP  05/14/2024  . INFLUENZA VACCINE  Completed  . DEXA SCAN  Completed  . PNA vac Low Risk Adult  Completed       Plan:     Our records indicate that:  You received your mammogram 05/23/2018. You are due next after 05/24/2019.  Your bone density scan (DEXA scan) you indicated had been completed in 2019. Please contact OBGYN office to fax report to our office at fax # [phone].  Your last colonoscopy was 09/12/2014. You are due for your next (and hopefully final) one  after 09/13/2019.  Refer to sleep hygiene and relaxation techniques provided. Will consult with Dr. Elease Hashimoto regarding use of melatonin or other sleep aid.  Consider receiving shingles vaccine at local pharmacy in near future.  I enjoyed our conversation! Thank you for meeting with me via webex! Hope to see you next year!   I have personally reviewed and noted the following in the patient's chart:   . Medical and social history . Use of alcohol, tobacco or illicit drugs  . Current medications and supplements . Functional ability and status . Nutritional status . Physical activity . Advanced directives . List of other physicians . Vitals . Screenings to include cognitive, depression, and falls . Referrals and appointments  In addition, I have reviewed and discussed with patient certain preventive protocols, quality metrics, and best practice recommendations. A written personalized care plan for preventive services as well as general preventive health recommendations were provided to patient.     Alphia Moh, RN  02/10/2019

## 2019-02-10 ENCOUNTER — Telehealth: Payer: Self-pay

## 2019-02-10 ENCOUNTER — Other Ambulatory Visit: Payer: Self-pay

## 2019-02-10 ENCOUNTER — Ambulatory Visit (INDEPENDENT_AMBULATORY_CARE_PROVIDER_SITE_OTHER): Payer: Medicare Other

## 2019-02-10 DIAGNOSIS — Z0000 Encounter for general adult medical examination without abnormal findings: Secondary | ICD-10-CM

## 2019-02-10 NOTE — Patient Instructions (Addendum)
Shelly [name] , Thank you for taking time to come for your Medicare Wellness Visit. I appreciate your ongoing commitment to your health goals. Please review the following plan we discussed and let me know if I can assist you in the future.   These are the goals we discussed: Goals    . Patient Stated     Start doing yoga, deep breathing exercises routinely    . Weight (lb) < 165 lb (74.8 kg)     Went on weight watchers in the past and walking more during the day Cutting back on food; cut back on butter;  Continue DB exercises - part of losing weight is relaxation     Serving Sizes A serving size is a measured amount of food or drink, such as one slice of bread, that has an associated nutrient content. Knowing the serving size of a food or drink can help you determine how much of that food you should consume. What is the size of one serving? The size of one healthy serving depends on the food or drink. To determine a serving size, read the food label. If the food or drink does not have a food label, try to find serving size information online. Or, use the following to estimate the size of one adult serving: Grain 1 slice bread.  bagel.  cup pasta. Vegetable  cup cooked or canned vegetables. 1 cup raw, leafy greens. Fruit  cup canned fruit. 1 medium fruit.  cup dried fruit. Meat and Other Protein Sources 1 oz meat, poultry, or fish.  cup cooked beans. 1 egg.  cup nuts or seeds. 1 Tbsp nut butter.  cup tofu or tempeh. 2 Tbsp hummus. Dairy An individual container of yogurt (6-8 oz). 1 piece of cheese the size of your thumb (1 oz). 1 cup (8 oz) milk or milk alternative. Fat A piece the size of one dice. 1 tsp soft margarine. 1 Tbsp mayonnaise. 1 tsp vegetable oil. 1 Tbsp regular salad dressing. 2 Tbsp low-fat salad dressing. How many servings should I eat from each food group each day? The following are the suggested number of servings to try and have every day from each food group.  You can also look at your eating throughout the week and aim for meeting these requirements on most days for overall healthy eating. Grain 6-8 servings. Try to have half of your grains from whole grains, such as whole wheat bread, corn tortillas, oatmeal, brown rice, whole wheat pasta, and bulgur. Vegetable At least 2-3 servings. Fruit 2 servings. Meat and Other Protein Foods 5-6 servings. Aim to have lean proteins, such as chicken, [place], fish, beans, or tofu. Dairy 3 servings. Choose low-fat or nonfat if you are trying to control your weight. Fat 2-3 servings. Is a serving the same thing as a portion? No. A portion is the actual amount you eat, which may be more than one serving. Knowing the specific serving size of a food and the nutritional information that goes with it can help you make a healthy decision on what size portion to eat. What are some tips to help me learn healthy serving sizes?  Check food labels for serving sizes. Many foods that come as a single portion actually contain multiple servings.  Determine the serving size of foods you commonly eat and figure out how large a portion you usually eat.  Measure the number of servings that can be held by the bowls, glasses, cups, and plates you typically use. For  example, pour your breakfast cereal into your regular bowl and then pour it into a measuring cup.  For 1-2 days, measure the serving sizes of all the foods you eat.  Practice estimating serving sizes and determining how big your portions should be. This information is not intended to replace advice given to you by your health care provider. Make sure you discuss any questions you have with your health care provider. Document Released: 08/01/2003 Document Revised: 06/27/2016 Document Reviewed: 01/30/2014 Elsevier Interactive Patient Education  [employer].         This is a list of the screening recommended for you and due dates:  Health Maintenance   Topic Date Due  .  Hepatitis C: One time screening is recommended by Center for Disease Control  (CDC) for  adults born from [birth year] through 1967.   02/10/2020*  . Colon Cancer Screening  09/13/2019  . Mammogram  05/23/2020  . Tetanus Vaccine  05/14/2024  . Flu Shot  Completed  . DEXA scan (bone density measurement)  Completed  . Pneumonia vaccines  Completed  *Topic was postponed. The date shown is not the original due date.     Our records indicate that:  You received your mammogram 05/23/2018. You are due next after 05/24/2019.  Your bone density scan (DEXA scan) you indicated had been completed in 2019. Please contact OBGYN office to fax report to our office at fax # [phone].  Your last colonoscopy was 09/12/2014. You are due for your next (and hopefully final) one after 09/13/2019.  Refer to sleep hygiene and relaxation techniques provided. Will consult with Dr. Elease Hashimoto regarding use of melatonin or other sleep aid.  Consider receiving shingles vaccine at local pharmacy in near future.  I enjoyed our conversation! Thank you for meeting with me via webex! Hope to see you next year!   Health Maintenance, Female Adopting a healthy lifestyle and getting preventive care can go a long way to promote health and wellness. Talk with your health care provider about what schedule of regular examinations is right for you. This is a good chance for you to check in with your provider about disease prevention and staying healthy. In between checkups, there are plenty of things you can do on your own. Experts have done a lot of research about which lifestyle changes and preventive measures are most likely to keep you healthy. Ask your health care provider for more information. Weight and diet Eat a healthy diet  Be sure to include plenty of vegetables, fruits, low-fat dairy products, and lean protein.  Do not eat a lot of foods high in solid fats, added sugars, or salt.  Get regular  exercise. This is one of the most important things you can do for your health. ? Most adults should exercise for at least 150 minutes each week. The exercise should increase your heart rate and make you sweat (moderate-intensity exercise). ? Most adults should also do strengthening exercises at least twice a week. This is in addition to the moderate-intensity exercise. Maintain a healthy weight  Body mass index (BMI) is a measurement that can be used to identify possible weight problems. It estimates body fat based on height and weight. Your health care provider can help determine your BMI and help you achieve or maintain a healthy weight.  For females 30 years of age and older: ? A BMI below 18.5 is considered underweight. ? A BMI of 18.5 to 24.9 is normal. ? A BMI of 25  to 29.9 is considered overweight. ? A BMI of 30 and above is considered obese. Watch levels of cholesterol and blood lipids  You should start having your blood tested for lipids and cholesterol at 75 years of age, then have this test every 5 years.  You may need to have your cholesterol levels checked more often if: ? Your lipid or cholesterol levels are high. ? You are older than 75 years of age. ? You are at high risk for heart disease. Cancer screening Lung Cancer  Lung cancer screening is recommended for adults 67-65 years old who are at high risk for lung cancer because of a history of smoking.  A yearly low-dose CT scan of the lungs is recommended for people who: ? Currently smoke. ? Have quit within the past 15 years. ? Have at least a 30-pack-year history of smoking. A pack year is smoking an average of one pack of cigarettes a day for 1 year.  Yearly screening should continue until it has been 15 years since you quit.  Yearly screening should stop if you develop a health problem that would prevent you from having lung cancer treatment. Breast Cancer  Practice breast self-awareness. This means  understanding how your breasts normally appear and feel.  It also means doing regular breast self-exams. Let your health care provider know about any changes, no matter how small.  If you are in your 20s or 30s, you should have a clinical breast exam (CBE) by a health care provider every 1-3 years as part of a regular health exam.  If you are 29 or older, have a CBE every year. Also consider having a breast X-ray (mammogram) every year.  If you have a family history of breast cancer, talk to your health care provider about genetic screening.  If you are at high risk for breast cancer, talk to your health care provider about having an MRI and a mammogram every year.  Breast cancer gene (BRCA) assessment is recommended for women who have family members with BRCA-related cancers. BRCA-related cancers include: ? Breast. ? Ovarian. ? Tubal. ? Peritoneal cancers.  Results of the assessment will determine the need for genetic counseling and BRCA1 and BRCA2 testing. Cervical Cancer Your health care provider may recommend that you be screened regularly for cancer of the pelvic organs (ovaries, uterus, and vagina). This screening involves a pelvic examination, including checking for microscopic changes to the surface of your cervix (Pap test). You may be encouraged to have this screening done every 3 years, beginning at age 62.  For women ages 13-65, health care providers may recommend pelvic exams and Pap testing every 3 years, or they may recommend the Pap and pelvic exam, combined with testing for human papilloma virus (HPV), every 5 years. Some types of HPV increase your risk of cervical cancer. Testing for HPV may also be done on women of any age with unclear Pap test results.  Other health care providers may not recommend any screening for nonpregnant women who are considered low risk for pelvic cancer and who do not have symptoms. Ask your health care provider if a screening pelvic exam is right  for you.  If you have had past treatment for cervical cancer or a condition that could lead to cancer, you need Pap tests and screening for cancer for at least 20 years after your treatment. If Pap tests have been discontinued, your risk factors (such as having a new sexual partner) need to be reassessed to  determine if screening should resume. Some women have medical problems that increase the chance of getting cervical cancer. In these cases, your health care provider may recommend more frequent screening and Pap tests. Colorectal Cancer  This type of cancer can be detected and often prevented.  Routine colorectal cancer screening usually begins at 75 years of age and continues through 75 years of age.  Your health care provider may recommend screening at an earlier age if you have risk factors for colon cancer.  Your health care provider may also recommend using home test kits to check for hidden blood in the stool.  A small camera at the end of a tube can be used to examine your colon directly (sigmoidoscopy or colonoscopy). This is done to check for the earliest forms of colorectal cancer.  Routine screening usually begins at age 13.  Direct examination of the colon should be repeated every 5-10 years through 75 years of age. However, you may need to be screened more often if early forms of precancerous polyps or small growths are found. Skin Cancer  Check your skin from head to toe regularly.  Tell your health care provider about any new moles or changes in moles, especially if there is a change in a mole's shape or color.  Also tell your health care provider if you have a mole that is larger than the size of a pencil eraser.  Always use sunscreen. Apply sunscreen liberally and repeatedly throughout the day.  Protect yourself by wearing long sleeves, pants, a wide-brimmed hat, and sunglasses whenever you are outside. Heart disease, diabetes, and high blood pressure  High blood  pressure causes heart disease and increases the risk of stroke. High blood pressure is more likely to develop in: ? People who have blood pressure in the high end of the normal range (130-139/85-89 mm Hg). ? People who are overweight or obese. ? People who are African American.  If you are 67-31 years of age, have your blood pressure checked every 3-5 years. If you are 47 years of age or older, have your blood pressure checked every year. You should have your blood pressure measured twice-once when you are at a hospital or clinic, and once when you are not at a hospital or clinic. Record the average of the two measurements. To check your blood pressure when you are not at a hospital or clinic, you can use: ? An automated blood pressure machine at a pharmacy. ? A home blood pressure monitor.  If you are between 73 years and 20 years old, ask your health care provider if you should take aspirin to prevent strokes.  Have regular diabetes screenings. This involves taking a blood sample to check your fasting blood sugar level. ? If you are at a normal weight and have a low risk for diabetes, have this test once every three years after 75 years of age. ? If you are overweight and have a high risk for diabetes, consider being tested at a younger age or more often. Preventing infection Hepatitis B  If you have a higher risk for hepatitis B, you should be screened for this virus. You are considered at high risk for hepatitis B if: ? You were born in a country where hepatitis B is common. Ask your health care provider which countries are considered high risk. ? Your parents were born in a high-risk country, and you have not been immunized against hepatitis B (hepatitis B vaccine). ? You have HIV or  AIDS. ? You use needles to inject street drugs. ? You live with someone who has hepatitis B. ? You have had sex with someone who has hepatitis B. ? You get hemodialysis treatment. ? You take certain  medicines for conditions, including cancer, organ transplantation, and autoimmune conditions. Hepatitis C  Blood testing is recommended for: ? Everyone born from [birth year] through 1967. ? Anyone with known risk factors for hepatitis C. Sexually transmitted infections (STIs)  You should be screened for sexually transmitted infections (STIs) including gonorrhea and chlamydia if: ? You are sexually active and are younger than 75 years of age. ? You are older than 75 years of age and your health care provider tells you that you are at risk for this type of infection. ? Your sexual activity has changed since you were last screened and you are at an increased risk for chlamydia or gonorrhea. Ask your health care provider if you are at risk.  If you do not have HIV, but are at risk, it may be recommended that you take a prescription medicine daily to prevent HIV infection. This is called pre-exposure prophylaxis (PrEP). You are considered at risk if: ? You are sexually active and do not regularly use condoms or know the HIV status of your partner(s). ? You take drugs by injection. ? You are sexually active with a partner who has HIV. Talk with your health care provider about whether you are at high risk of being infected with HIV. If you choose to begin PrEP, you should first be tested for HIV. You should then be tested every 3 months for as long as you are taking PrEP. Pregnancy  If you are premenopausal and you may become pregnant, ask your health care provider about preconception counseling.  If you may become pregnant, take 400 to 800 micrograms (mcg) of folic acid every day.  If you want to prevent pregnancy, talk to your health care provider about birth control (contraception). Osteoporosis and menopause  Osteoporosis is a disease in which the bones lose minerals and strength with aging. This can result in serious bone fractures. Your risk for osteoporosis can be identified using a bone density  scan.  If you are 27 years of age or older, or if you are at risk for osteoporosis and fractures, ask your health care provider if you should be screened.  Ask your health care provider whether you should take a calcium or vitamin D supplement to lower your risk for osteoporosis.  Menopause may have certain physical symptoms and risks.  Hormone replacement therapy may reduce some of these symptoms and risks. Talk to your health care provider about whether hormone replacement therapy is right for you. Follow these instructions at home:  Schedule regular health, dental, and eye exams.  Stay current with your immunizations.  Do not use any tobacco products including cigarettes, chewing tobacco, or electronic cigarettes.  If you are pregnant, do not drink alcohol.  If you are breastfeeding, limit how much and how often you drink alcohol.  Limit alcohol intake to no more than 1 drink per day for nonpregnant women. One drink equals 12 ounces of beer, 5 ounces of wine, or 1 ounces of hard liquor.  Do not use street drugs.  Do not share needles.  Ask your health care provider for help if you need support or information about quitting drugs.  Tell your health care provider if you often feel depressed.  Tell your health care provider if you have ever  been abused or do not feel safe at home. This information is not intended to replace advice given to you by your health care provider. Make sure you discuss any questions you have with your health care provider. Document Released: 05/18/2011 Document Revised: 04/09/2016 Document Reviewed: 08/06/2015 Elsevier Interactive Patient Education  2019 [employer].

## 2019-02-10 NOTE — Telephone Encounter (Signed)
During awv, pt. reported poor sleep. Hygiene tips discussed. Pt. wanted to know if she should try melatonin; deferred to Dr. Elease Hashimoto for follow-up.

## 2019-02-13 NOTE — Telephone Encounter (Signed)
I think Melatonin would be OK to try- 1 to 3 mg qhs prn

## 2019-02-13 NOTE — Telephone Encounter (Signed)
Author left detailed VM relaying Dr. Erick Blinks message.

## 2019-03-08 ENCOUNTER — Ambulatory Visit: Payer: Medicare Other

## 2019-03-10 ENCOUNTER — Other Ambulatory Visit: Payer: Self-pay | Admitting: Family Medicine

## 2019-03-10 NOTE — Telephone Encounter (Signed)
OK to continue this medication? 

## 2019-03-10 NOTE — Telephone Encounter (Signed)
Refill once 

## 2019-04-25 DIAGNOSIS — H2511 Age-related nuclear cataract, right eye: Secondary | ICD-10-CM | POA: Diagnosis not present

## 2019-04-25 DIAGNOSIS — H18413 Arcus senilis, bilateral: Secondary | ICD-10-CM | POA: Diagnosis not present

## 2019-04-25 DIAGNOSIS — H2513 Age-related nuclear cataract, bilateral: Secondary | ICD-10-CM | POA: Diagnosis not present

## 2019-04-25 DIAGNOSIS — H25043 Posterior subcapsular polar age-related cataract, bilateral: Secondary | ICD-10-CM | POA: Diagnosis not present

## 2019-04-25 DIAGNOSIS — H25013 Cortical age-related cataract, bilateral: Secondary | ICD-10-CM | POA: Diagnosis not present

## 2019-05-08 ENCOUNTER — Ambulatory Visit (INDEPENDENT_AMBULATORY_CARE_PROVIDER_SITE_OTHER): Payer: Medicare Other | Admitting: Family Medicine

## 2019-05-08 ENCOUNTER — Other Ambulatory Visit: Payer: Self-pay

## 2019-05-08 DIAGNOSIS — M25552 Pain in left hip: Secondary | ICD-10-CM

## 2019-05-08 NOTE — Progress Notes (Signed)
Patient ID: Shelly [name], female   DOB: 04-23-1946, 73 y.o.   MRN: [id number]   This visit type was conducted due to national recommendations for restrictions regarding the COVID-19 pandemic in an effort to limit this patient's exposure and mitigate transmission in our community.   Virtual Visit via Video Note  I connected with Matisse Roskelley on 05/08/19 at  1:45 PM EDT by a video enabled telemedicine application and verified that I am speaking with the correct person using two identifiers.  Location patient: home Location provider:work or home office Persons participating in the virtual visit: patient, provider  I discussed the limitations of evaluation and management by telemedicine and the availability of in person appointments. The patient expressed understanding and agreed to proceed.   HPI: Patient has had some ongoing left hip pain.  No injury.  Location is lateral hip.  Relatively continuous.  Symptoms are worse at night.  Achy discomfort.  We had suspected trochanteric bursitis in the past.  Minimal relief with ice.  Walking without difficulty.  She has scheduled cataract surgery in about a week followed by the other eye at about 3 weeks.   ROS: See pertinent positives and negatives per HPI.  Past Medical History:  Diagnosis Date  . Arthritis    wrist, shoulder   . Diverticulitis   . Endometrial polyp   . GERD (gastroesophageal reflux disease)    occasional - diet controlled  . Hx of adenomatous colonic polyps   . Hyperlipidemia   . IBS (irritable bowel syndrome)   . Missed abortion    x 1 - resolved - no surgery  . Osteoporosis   . SVD (spontaneous vaginal delivery)    x 1  . Transient vision disturbance 2019   was evaluated by ED, no findings  . Vitamin D deficiency     Past Surgical History:  Procedure Laterality Date  . COLONOSCOPY    . DIAGNOSTIC LAPAROSCOPY     cysts  . DILATATION & CURETTAGE/HYSTEROSCOPY WITH TRUECLEAR N/A 10/31/2013   Procedure:  DILATATION & CURETTAGE/HYSTEROSCOPY WITH TRUCLEAR;  Surgeon: Marylynn Pearson, MD;  Location: [facility];  Service: Gynecology;  Laterality: N/A;  . DILATION AND CURETTAGE OF UTERUS    . TONSILLECTOMY  1952  . WISDOM TOOTH EXTRACTION      Family History  Problem Relation Age of Onset  . Hyperlipidemia Mother   . Hypertension Mother   . Diabetes Mother        type ll  . Leukemia Mother   . Lung cancer Father   . Hyperlipidemia Father   . Breast cancer Cousin 30  . Uterine cancer Other        grandmother  . Heart disease Other   . Hypertension Other   . Cancer Other   . Depression Brother   . Breast cancer Maternal Aunt   . Cancer Maternal Aunt   . Uterine cancer Paternal Grandmother     SOCIAL HX: Non-smoker   Current Outpatient Medications:  .  acetaminophen (TYLENOL) 500 MG tablet, Take 500 mg by mouth every 6 (six) hours as needed for headache., Disp: , Rfl:  .  bismuth subsalicylate (PEPTO BISMOL) 262 MG/15ML suspension, Take 30 mLs by mouth every 6 (six) hours as needed., Disp: , Rfl:  .  clidinium-chlordiazePOXIDE (LIBRAX) 5-2.5 MG capsule, Take 1 capsule by mouth every 8 (eight) hours as needed (bowels)., Disp: 60 capsule, Rfl: 0 .  denosumab (PROLIA) 60 MG/ML SOSY injection, Inject 60 mg into the skin every  6 (six) months., Disp: , Rfl:  .  esomeprazole (NEXIUM) 20 MG capsule, Take 20 mg by mouth daily as needed (acid reflux). , Disp: , Rfl:  .  loratadine (CLARITIN) 10 MG tablet, Take 10 mg by mouth daily as needed for allergies. , Disp: , Rfl:   EXAM:  VITALS per patient if applicable:  GENERAL: alert, oriented, appears well and in no acute distress  HEENT: atraumatic, conjunttiva clear, no obvious abnormalities on inspection of external nose and ears  NECK: normal movements of the head and neck  LUNGS: on inspection no signs of respiratory distress, breathing rate appears normal, no obvious gross SOB, gasping or wheezing  CV: no obvious cyanosis  MS: moves all  visible extremities without noticeable abnormality  PSYCH/NEURO: pleasant and cooperative, no obvious depression or anxiety, speech and thought processing grossly intact  ASSESSMENT AND PLAN:  Discussed the following assessment and plan:  Left hip pain-suspect left greater trochanteric bursitis by history  -We discussed possible corticosteroid injection of hip given the fact that she has not seen improvement with icing.  She will check with her ophthalmologist first to make sure they approve of this in view of her upcoming cataract surgeries    I discussed the assessment and treatment plan with the patient. The patient was provided an opportunity to ask questions and all were answered. The patient agreed with the plan and demonstrated an understanding of the instructions.   The patient was advised to call back or seek an in-person evaluation if the symptoms worsen or if the condition fails to improve as anticipated.   Carolann Littler, MD

## 2019-05-09 ENCOUNTER — Ambulatory Visit (INDEPENDENT_AMBULATORY_CARE_PROVIDER_SITE_OTHER): Payer: Medicare Other | Admitting: Family Medicine

## 2019-05-09 ENCOUNTER — Encounter: Payer: Self-pay | Admitting: Family Medicine

## 2019-05-09 ENCOUNTER — Other Ambulatory Visit: Payer: Self-pay

## 2019-05-09 VITALS — BP 136/76 | HR 85 | Temp 97.8°F | Ht 63.25 in | Wt 181.1 lb

## 2019-05-09 DIAGNOSIS — R350 Frequency of micturition: Secondary | ICD-10-CM

## 2019-05-09 DIAGNOSIS — M7062 Trochanteric bursitis, left hip: Secondary | ICD-10-CM | POA: Diagnosis not present

## 2019-05-09 LAB — POCT URINALYSIS DIPSTICK
Bilirubin, UA: NEGATIVE
Blood, UA: NEGATIVE
Glucose, UA: NEGATIVE
Ketones, UA: NEGATIVE
Leukocytes, UA: NEGATIVE
Nitrite, UA: NEGATIVE
Protein, UA: NEGATIVE
Spec Grav, UA: >=1.03 — AB (ref 1.010–1.025)
Urobilinogen, UA: 0.2 E.U./dL
pH, UA: 6 (ref 5.0–8.0)

## 2019-05-09 MED ORDER — METHYLPREDNISOLONE ACETATE 80 MG/ML IJ SUSP
80.0000 mg | Freq: Once | INTRAMUSCULAR | Status: AC
  Administered 2019-05-09: 80 mg via INTRA_ARTICULAR

## 2019-05-09 NOTE — Progress Notes (Signed)
Subjective:     Patient ID: Shelly [name], female   DOB: [birth year], 73 y.o.   MRN: [id number]  HPI Pain with left hip pain.  We discussed this last week.  She has achiness which is worse at night.  Pain is moderate and starting to interfere more with sleep.  Denies any recent injury.  Location is lateral hip.  She has tried icing with minimal relief.  Denies any low back pain.  No weakness.  No numbness.  No skin rashes.  She also had some recent urine frequency.  She had some vague bilateral abdominal symptoms yesterday which she thinks may be related to IBS.  She took a Librax which helped.  She had occasional burning with urination.  No fevers or chills.  No gross hematuria.  Past Medical History:  Diagnosis Date  . Arthritis    wrist, shoulder   . Diverticulitis   . Endometrial polyp   . GERD (gastroesophageal reflux disease)    occasional - diet controlled  . Hx of adenomatous colonic polyps   . Hyperlipidemia   . IBS (irritable bowel syndrome)   . Missed abortion    x 1 - resolved - no surgery  . Osteoporosis   . SVD (spontaneous vaginal delivery)    x 1  . Transient vision disturbance 2019   was evaluated by ED, no findings  . Vitamin D deficiency    Past Surgical History:  Procedure Laterality Date  . COLONOSCOPY    . DIAGNOSTIC LAPAROSCOPY     cysts  . DILATATION & CURETTAGE/HYSTEROSCOPY WITH TRUECLEAR N/A 10/31/2013   Procedure: DILATATION & CURETTAGE/HYSTEROSCOPY WITH TRUCLEAR;  Surgeon: Marylynn Pearson, MD;  Location: [facility];  Service: Gynecology;  Laterality: N/A;  . DILATION AND CURETTAGE OF UTERUS    . TONSILLECTOMY  1952  . WISDOM TOOTH EXTRACTION      reports that she quit smoking about 11 years ago. Her smoking use included cigarettes. She has a 2.00 pack-year smoking history. She has never used smokeless tobacco. She reports current alcohol use of about 14.0 standard drinks of alcohol per week. She reports that she does not use drugs. family history  includes Breast cancer in her maternal aunt; Breast cancer (age of onset: 5) in her cousin; Cancer in her maternal aunt and another family member; Depression in her brother; Diabetes in her mother; Heart disease in an other family member; Hyperlipidemia in her father and mother; Hypertension in her mother and another family member; Leukemia in her mother; Lung cancer in her father; Uterine cancer in her paternal grandmother and another family member. Allergies  Allergen Reactions  . Crestor [Rosuvastatin Calcium] Itching    GI upset  . Lipitor [Atorvastatin Calcium] Itching  . Nsaids Other (See Comments)    GI upset  . Pravastatin Itching    GI upset  . Sulfonamide Derivatives Rash    Hives, itiching     Review of Systems  Constitutional: Negative for chills and fever.  Respiratory: Negative for cough and shortness of breath.   Genitourinary: Positive for frequency. Negative for flank pain, hematuria and vaginal bleeding.  Musculoskeletal: Positive for arthralgias. Negative for back pain.       Objective:   Physical Exam Constitutional:      Appearance: Normal appearance.  Neck:     Musculoskeletal: Neck supple.  Cardiovascular:     Rate and Rhythm: Normal rate and regular rhythm.  Pulmonary:     Effort: Pulmonary effort is normal.  Breath sounds: Normal breath sounds.  Musculoskeletal:     Comments: Left hip reveals excellent range of motion.  She has some tenderness over the lateral hip/greater trochanteric bursa region.  No visible bruising.  No redness.  No swelling.  Neurological:     Mental Status: She is alert.        Assessment:     #1 greater trochanteric bursitis involving left hip  #2 dysuria.  Urine dipstick is unremarkable with no signs of secondary infection    Plan:     -We discussed risk and benefits of steroid injection left hip bursa region.  She has not had improvement with conservative therapy such as ice.  We discussed risk including  bleeding, bruising, infection.  Patient consented.  Area over the left greater trochanteric bursa prepped with Betadine.  Use sterile technique.  80 mg Depo-Medrol and 2 cc of plain Xylocaine injected with 25-gauge 1-1/2 inch needle and patient tolerated well.  Continue icing as needed  -We reviewed her urinalysis results.  No evidence for UTI.  Eulas Post MD [facility] [place]

## 2019-05-09 NOTE — Patient Instructions (Signed)
Bursitis    Bursitis is inflammation and irritation of a bursa, which is one of the small, fluid-filled sacs that cushion and protect the moving parts of your body. These sacs are located between bones and muscles, bones and muscle attachments, or bones and skin areas that are next to bones. A bursa protects those structures from the wear and tear that results from frequent movement.  An inflamed bursa causes pain and swelling. Fluid may build up inside the sac. Bursitis is most common near joints, especially the knees, elbows, hips, and shoulders.  What are the causes?  This condition may be caused by:   Injury from:  ? A direct hit (blow), like falling on your knee or elbow.  ? Overuse of a joint (repetitive stress).   Infection. This can happen if bacteria get into a bursa through a cut or scrape near a joint.   Diseases that cause joint inflammation, such as gout and rheumatoid arthritis.  What increases the risk?  You are more likely to develop this condition if you:   Have a job or hobby that involves a lot of repetitive stress on your joints.   Have a condition that weakens your body's defense system (immune system), such as diabetes, cancer, or HIV.   Do any of these often:  ? Lift and reach overhead.  ? Kneel or lean on hard surfaces.  ? Run or walk.  What are the signs or symptoms?  The most common symptoms of this condition include:   Pain that gets worse when you move the affected body part or use it to support (bear) your body weight.   Inflammation.   Stiffness.  Other symptoms include:   Redness.   Swelling.   Tenderness.   Warmth.   Pain that continues after rest.   Fever or chills. These may occur in bursitis that is caused by infection.  How is this diagnosed?  This condition may be diagnosed based on:   Your medical history and a physical exam.   Imaging tests, such as an MRI.   A procedure to drain fluid from the bursa with a needle (aspiration). The fluid may be checked for  signs of infection or gout.   Blood tests to rule out other causes of inflammation.  How is this treated?  This condition can usually be treated at home with rest, ice, applying pressure (compression), and raising the body part that is affected (elevation). This is called RICE therapy. For mild bursitis, RICE therapy may be all you need. Other treatments may include:   NSAIDs to treat pain and inflammation.   Corticosteroid medicines to fight inflammation. These medicines may be injected into and around the area of bursitis.   Aspiration of fluid from the bursa to relieve pain and improve movement.   Antibiotic medicine to treat an infected bursa.   A splint, brace, or walking aid, such as a cane.   Physical therapy if you continue to have pain or limited movement.   Surgery to remove a damaged or infected bursa. This may be needed if other treatments have not worked.  Follow these instructions at home:  Medicines   Take over-the-counter and prescription medicines only as told by your health care provider.   If you were prescribed an antibiotic medicine, take it as told by your health care provider. Do not stop taking the antibiotic even if you start to feel better.  General instructions     Rest the affected area   as told by your health care provider.  ? If possible, raise (elevate) the affected area above the level of your heart while you are sitting or lying down.  ? Avoid activities that make pain worse.   Use splints, braces, pads, or walking aids as told by your health care provider.   If directed, put ice on the affected area:  ? If you have a removable splint or brace, remove it as told by your health care provider.  ? Put ice in a plastic bag.  ? Place a towel between your skin and the bag or between your splint or brace and the bag.  ? Leave the ice on for 20 minutes, 2-3 times a day.   Keep all follow-up visits as told by your health care provider. This is important.  Preventing future  episodes  Take actions to help prevent future episodes of bursitis.   Wear knee pads if you kneel often.   Wear sturdy running or walking shoes that fit you well.   Take breaks regularly from repetitive activity.   Warm up by stretching before doing any activity that takes a lot of effort.   Maintain a healthy weight or lose weight as recommended by your health care provider. If you need help doing this, ask your health care provider.   Exercise regularly. Start any new physical activity gradually.  Contact a health care provider if you:   Have a fever.   Have chills.   Have bursitis that is not getting better with treatment or home care.  Summary   Bursitis is inflammation and irritation of a bursa, which is one of the small, fluid-filled sacs that cushion and protect the moving parts of your body.   An inflamed bursa causes pain and swelling.   Bursitis is commonly diagnosed with a physical exam, but other tests are sometimes needed.   This condition can usually be treated at home with rest, ice, applying pressure (compression), and raising the body part that is affected (elevation). This is called RICE therapy.  This information is not intended to replace advice given to you by your health care provider. Make sure you discuss any questions you have with your health care provider.  Document Released: 10/30/2000 Document Revised: 09/17/2017 Document Reviewed: 09/17/2017  Elsevier Interactive Patient Education  2019 Elsevier Inc[employer]

## 2019-05-09 NOTE — Addendum Note (Signed)
Addended by: Anibal Henderson on: 05/09/2019 03:16 PM   Modules accepted: Orders

## 2019-05-12 ENCOUNTER — Ambulatory Visit: Payer: Medicare Other | Admitting: Family Medicine

## 2019-05-15 DIAGNOSIS — H5203 Hypermetropia, bilateral: Secondary | ICD-10-CM | POA: Diagnosis not present

## 2019-05-15 DIAGNOSIS — H2511 Age-related nuclear cataract, right eye: Secondary | ICD-10-CM | POA: Diagnosis not present

## 2019-05-15 DIAGNOSIS — Z961 Presence of intraocular lens: Secondary | ICD-10-CM | POA: Diagnosis not present

## 2019-05-15 DIAGNOSIS — H524 Presbyopia: Secondary | ICD-10-CM | POA: Diagnosis not present

## 2019-05-15 DIAGNOSIS — H52222 Regular astigmatism, left eye: Secondary | ICD-10-CM | POA: Diagnosis not present

## 2019-05-16 DIAGNOSIS — H2512 Age-related nuclear cataract, left eye: Secondary | ICD-10-CM | POA: Diagnosis not present

## 2019-06-15 ENCOUNTER — Telehealth: Payer: Self-pay | Admitting: *Deleted

## 2019-06-15 NOTE — Telephone Encounter (Signed)
Copied from [place] [id number]. Topic: Appointment Scheduling - Scheduling Inquiry for Clinic >> Jun 15, 2019  9:33 AM Burchel, Abbi R wrote: Reason for CRM: Pt would like to sched appt for Prolia injection.  Please call pt: [phone]

## 2019-06-16 NOTE — Telephone Encounter (Signed)
Prolia due after 07/28/2019.  Patient is aware.

## 2019-07-03 DIAGNOSIS — H2512 Age-related nuclear cataract, left eye: Secondary | ICD-10-CM | POA: Diagnosis not present

## 2019-07-05 ENCOUNTER — Other Ambulatory Visit: Payer: Self-pay | Admitting: Family Medicine

## 2019-07-05 DIAGNOSIS — Z1231 Encounter for screening mammogram for malignant neoplasm of breast: Secondary | ICD-10-CM

## 2019-07-13 ENCOUNTER — Telehealth (INDEPENDENT_AMBULATORY_CARE_PROVIDER_SITE_OTHER): Payer: Medicare Other | Admitting: Family Medicine

## 2019-07-13 ENCOUNTER — Telehealth: Payer: Self-pay | Admitting: *Deleted

## 2019-07-13 ENCOUNTER — Other Ambulatory Visit: Payer: Self-pay

## 2019-07-13 ENCOUNTER — Encounter: Payer: Self-pay | Admitting: Family Medicine

## 2019-07-13 DIAGNOSIS — L03032 Cellulitis of left toe: Secondary | ICD-10-CM | POA: Diagnosis not present

## 2019-07-13 DIAGNOSIS — L989 Disorder of the skin and subcutaneous tissue, unspecified: Secondary | ICD-10-CM

## 2019-07-13 MED ORDER — DOXYCYCLINE HYCLATE 100 MG PO TABS: 100.0000 mg | ORAL_TABLET | Freq: Two times a day (BID) | ORAL | 0 refills | Status: DC

## 2019-07-13 NOTE — Telephone Encounter (Signed)
Copied from [place] [id number]. Topic: General - Other >> Jul 12, 2019 12:14 PM Alanda Slim E wrote: Reason for CRM: Pt wants to make sure that her next prolia injection gets approved/cleared with insurance / appt scheduled for 9.11.20/ please advise

## 2019-07-13 NOTE — Patient Instructions (Addendum)
-  I sent the medication we discussed to the pharmacy: Meds ordered this encounter  Medications  . doxycycline (VIBRA-TABS) 100 MG tablet    Sig: Take 1 tablet (100 mg total) by mouth 2 (two) times daily.    Dispense:  10 tablet    Refill:  0  Please let us know if you have any questions or concerns regarding this prescription.  I hope you are feeling better soon! Seek care promptly if your symptoms worsen or new concerns arise prior to your follow up visit tomorrow or Monday.

## 2019-07-13 NOTE — Progress Notes (Signed)
Virtual Visit via Video Note  I connected with Shelly [name]  on 07/13/19 at 10:40 AM EDT by a video enabled telemedicine application and verified that I am speaking with the correct person using two identifiers.  Location patient: home Location provider:work or home office Persons participating in the virtual visit: patient, provider  I discussed the limitations of evaluation and management by telemedicine and the availability of in person appointments. The patient expressed understanding and agreed to proceed.   HPI:  Acute visit for wound on her toe: -4th digit L foot -she noticed this yesterday -she has a small crack in the skin, husband squeezed it some and some clear drainage came out, there is a little redness and itching and pain at this location -denies pus, spreading redness, fevers, malaise, elevated blood sugars -she used an antibiotic cream on it and that helped a little   ROS: See pertinent positives and negatives per HPI.  Past Medical History:  Diagnosis Date  . Arthritis    wrist, shoulder   . Diverticulitis   . Endometrial polyp   . GERD (gastroesophageal reflux disease)    occasional - diet controlled  . Hx of adenomatous colonic polyps   . Hyperlipidemia   . IBS (irritable bowel syndrome)   . Missed abortion    x 1 - resolved - no surgery  . Osteoporosis   . SVD (spontaneous vaginal delivery)    x 1  . Transient vision disturbance 2019   was evaluated by ED, no findings  . Vitamin D deficiency     Past Surgical History:  Procedure Laterality Date  . COLONOSCOPY    . DIAGNOSTIC LAPAROSCOPY     cysts  . DILATATION & CURETTAGE/HYSTEROSCOPY WITH TRUECLEAR N/A 10/31/2013   Procedure: DILATATION & CURETTAGE/HYSTEROSCOPY WITH TRUCLEAR;  Surgeon: Marylynn Pearson, MD;  Location: [facility];  Service: Gynecology;  Laterality: N/A;  . DILATION AND CURETTAGE OF UTERUS    . TONSILLECTOMY  1952  . WISDOM TOOTH EXTRACTION      Family History  Problem Relation Age  of Onset  . Hyperlipidemia Mother   . Hypertension Mother   . Diabetes Mother        type ll  . Leukemia Mother   . Lung cancer Father   . Hyperlipidemia Father   . Breast cancer Cousin 30  . Uterine cancer Other        grandmother  . Heart disease Other   . Hypertension Other   . Cancer Other   . Depression Brother   . Breast cancer Maternal Aunt   . Cancer Maternal Aunt   . Uterine cancer Paternal Grandmother     SOCIAL HX: see hpi   Current Outpatient Medications:  .  acetaminophen (TYLENOL) 500 MG tablet, Take 500 mg by mouth every 6 (six) hours as needed for headache., Disp: , Rfl:  .  bismuth subsalicylate (PEPTO BISMOL) 262 MG/15ML suspension, Take 30 mLs by mouth every 6 (six) hours as needed., Disp: , Rfl:  .  clidinium-chlordiazePOXIDE (LIBRAX) 5-2.5 MG capsule, Take 1 capsule by mouth every 8 (eight) hours as needed (bowels)., Disp: 60 capsule, Rfl: 0 .  denosumab (PROLIA) 60 MG/ML SOSY injection, Inject 60 mg into the skin every 6 (six) months., Disp: , Rfl:  .  esomeprazole (NEXIUM) 20 MG capsule, Take 20 mg by mouth daily as needed (acid reflux). , Disp: , Rfl:  .  loratadine (CLARITIN) 10 MG tablet, Take 10 mg by mouth daily as needed for allergies. ,  Disp: , Rfl:  .  doxycycline (VIBRA-TABS) 100 MG tablet, Take 1 tablet (100 mg total) by mouth 2 (two) times daily., Disp: 10 tablet, Rfl: 0  EXAM:  VITALS per patient if applicable:denies fever  GENERAL: alert, oriented, appears well and in no acute distress  HEENT: atraumatic, conjunttiva clear, no obvious abnormalities on inspection of external nose and ears  NECK: normal movements of the head and neck  LUNGS: on inspection no signs of respiratory distress, breathing rate appears normal, no obvious gross SOB, gasping or wheezing  CV: no obvious cyanosis  Skin: appears from limited video exam to have a  small erythematous papule on the dorsal aspect of the L 4th toe with some surrounding erythema and mild  edema. No appreciable edema or erythema of the rest of the foot, no appreciable purulence or fluctuance on ecam by husband per his description and report  MS: moves all visible extremities without noticeable abnormality  PSYCH/NEURO: pleasant and cooperative, no obvious depression or anxiety, speech and thought processing grossly intact  ASSESSMENT AND PLAN:  Discussed the following assessment and plan:  Skin lesion  Cellulitis of toe of left foot  -we discussed possible serious and likely etiologies, workup and treatment, treatment risks and return precautions -after this discussion, [name] opted for empiric treatment for possible developing cellulitis with doxy 100mg  bid x 5 days, salt water soaks 1-2 times daily, good wound care and close observation. It is difficult to assess this wound well via video and this is in a location that is close to tendons and bone. She is agreeable to close follow up 30 hours or Monday to assess in person and ensure is improving. Sent message to admin staff to assist in scheduling.  -of course, we advised [name]  to return or notify a doctor immediately if symptoms worsen or persist or new concerns arise.    I discussed the assessment and treatment plan with the patient. The patient was provided an opportunity to ask questions and all were answered. The patient agreed with the plan and demonstrated an understanding of the instructions.     [name], DO   Patient Instructions   -I sent the medication we discussed to the pharmacy: Meds ordered this encounter  Medications  . doxycycline (VIBRA-TABS) 100 MG tablet    Sig: Take 1 tablet (100 mg total) by mouth 2 (two) times daily.    Dispense:  10 tablet    Refill:  0  Please let us know if you have any questions or concerns regarding this prescription.  I hope you are feeling better soon! Seek care promptly if your symptoms worsen or new concerns arise prior to your follow up visit tomorrow  or Monday.

## 2019-07-17 ENCOUNTER — Ambulatory Visit: Payer: Medicare Other | Admitting: Family Medicine

## 2019-07-17 NOTE — Telephone Encounter (Signed)
Has this been approved? [facility] scheduled it and shouldn't have.Marland KitchenMarland KitchenMarland Kitchen

## 2019-07-25 NOTE — Telephone Encounter (Signed)
Pt calling to check status and would like to know if prolia is approved. Please advise   Cb#[phone]

## 2019-07-27 NOTE — Telephone Encounter (Signed)
Spoke with patient and appointment made

## 2019-07-28 ENCOUNTER — Other Ambulatory Visit: Payer: Self-pay

## 2019-07-28 ENCOUNTER — Ambulatory Visit (INDEPENDENT_AMBULATORY_CARE_PROVIDER_SITE_OTHER): Payer: Medicare Other | Admitting: *Deleted

## 2019-07-28 DIAGNOSIS — M810 Age-related osteoporosis without current pathological fracture: Secondary | ICD-10-CM | POA: Diagnosis not present

## 2019-07-28 DIAGNOSIS — Q782 Osteopetrosis: Secondary | ICD-10-CM

## 2019-07-28 MED ORDER — DENOSUMAB 60 MG/ML ~~LOC~~ SOSY
60.0000 mg | PREFILLED_SYRINGE | Freq: Once | SUBCUTANEOUS | Status: AC
  Administered 2019-07-28: 60 mg via SUBCUTANEOUS

## 2019-07-28 NOTE — Progress Notes (Signed)
Patient in for [place] for for Prolia. Injection given with no adverse reaction.

## 2019-08-17 DIAGNOSIS — Z23 Encounter for immunization: Secondary | ICD-10-CM | POA: Diagnosis not present

## 2019-08-17 DIAGNOSIS — C801 Malignant (primary) neoplasm, unspecified: Secondary | ICD-10-CM

## 2019-08-17 HISTORY — PX: BREAST EXCISIONAL BIOPSY: SUR124

## 2019-08-17 HISTORY — DX: Malignant (primary) neoplasm, unspecified: C80.1

## 2019-08-18 ENCOUNTER — Other Ambulatory Visit: Payer: Self-pay

## 2019-08-18 ENCOUNTER — Ambulatory Visit
Admission: RE | Admit: 2019-08-18 | Discharge: 2019-08-18 | Disposition: A | Discharge status: Home / Self Care | Payer: Medicare Other | Source: Ambulatory Visit | Attending: Family Medicine | Admitting: Family Medicine

## 2019-08-18 DIAGNOSIS — Z1231 Encounter for screening mammogram for malignant neoplasm of breast: Secondary | ICD-10-CM

## 2019-08-21 ENCOUNTER — Other Ambulatory Visit: Payer: Self-pay | Admitting: Family Medicine

## 2019-08-21 DIAGNOSIS — R928 Other abnormal and inconclusive findings on diagnostic imaging of breast: Secondary | ICD-10-CM

## 2019-08-22 ENCOUNTER — Encounter: Payer: Self-pay | Admitting: Gastroenterology

## 2019-08-24 ENCOUNTER — Other Ambulatory Visit: Payer: Self-pay | Admitting: Family Medicine

## 2019-08-24 ENCOUNTER — Ambulatory Visit
Admission: RE | Admit: 2019-08-24 | Discharge: 2019-08-24 | Disposition: A | Discharge status: Home / Self Care | Payer: Medicare Other | Source: Ambulatory Visit | Attending: Family Medicine | Admitting: Family Medicine

## 2019-08-24 ENCOUNTER — Other Ambulatory Visit: Payer: Self-pay

## 2019-08-24 DIAGNOSIS — N6321 Unspecified lump in the left breast, upper outer quadrant: Secondary | ICD-10-CM | POA: Diagnosis not present

## 2019-08-24 DIAGNOSIS — R922 Inconclusive mammogram: Secondary | ICD-10-CM | POA: Diagnosis not present

## 2019-08-24 DIAGNOSIS — R928 Other abnormal and inconclusive findings on diagnostic imaging of breast: Secondary | ICD-10-CM

## 2019-08-24 DIAGNOSIS — N6320 Unspecified lump in the left breast, unspecified quadrant: Secondary | ICD-10-CM

## 2019-08-24 DIAGNOSIS — N6489 Other specified disorders of breast: Secondary | ICD-10-CM | POA: Diagnosis not present

## 2019-08-28 ENCOUNTER — Other Ambulatory Visit: Payer: Self-pay

## 2019-08-28 ENCOUNTER — Ambulatory Visit
Admission: RE | Admit: 2019-08-28 | Discharge: 2019-08-28 | Disposition: A | Discharge status: Home / Self Care | Payer: Medicare Other | Source: Ambulatory Visit | Attending: Family Medicine | Admitting: Family Medicine

## 2019-08-28 DIAGNOSIS — N6342 Unspecified lump in left breast, subareolar: Secondary | ICD-10-CM | POA: Diagnosis not present

## 2019-08-28 DIAGNOSIS — N6320 Unspecified lump in the left breast, unspecified quadrant: Secondary | ICD-10-CM

## 2019-08-28 DIAGNOSIS — N6321 Unspecified lump in the left breast, upper outer quadrant: Secondary | ICD-10-CM | POA: Diagnosis not present

## 2019-08-28 DIAGNOSIS — C50412 Malignant neoplasm of upper-outer quadrant of left female breast: Secondary | ICD-10-CM | POA: Diagnosis not present

## 2019-08-28 DIAGNOSIS — D242 Benign neoplasm of left breast: Secondary | ICD-10-CM | POA: Diagnosis not present

## 2019-08-29 ENCOUNTER — Other Ambulatory Visit: Payer: Self-pay | Admitting: Family Medicine

## 2019-08-29 DIAGNOSIS — N6489 Other specified disorders of breast: Secondary | ICD-10-CM

## 2019-08-30 ENCOUNTER — Other Ambulatory Visit: Payer: Self-pay

## 2019-08-30 ENCOUNTER — Telehealth: Payer: Self-pay

## 2019-08-30 ENCOUNTER — Telehealth (INDEPENDENT_AMBULATORY_CARE_PROVIDER_SITE_OTHER): Payer: Medicare Other | Admitting: Family Medicine

## 2019-08-30 DIAGNOSIS — F418 Other specified anxiety disorders: Secondary | ICD-10-CM | POA: Diagnosis not present

## 2019-08-30 DIAGNOSIS — F5102 Adjustment insomnia: Secondary | ICD-10-CM | POA: Diagnosis not present

## 2019-08-30 MED ORDER — LORAZEPAM 0.5 MG PO TABS: 0.5000 mg | ORAL_TABLET | Freq: Three times a day (TID) | ORAL | 0 refills | Status: DC | PRN

## 2019-08-30 NOTE — Telephone Encounter (Signed)
Spoke with pt and she agreed to a virtual visit. We were able to get her an appointment for today at 5:30

## 2019-08-30 NOTE — Progress Notes (Signed)
This visit type was conducted due to national recommendations for restrictions regarding the COVID-19 pandemic in an effort to limit this patient's exposure and mitigate transmission in our community.   Virtual Visit via Telephone Note  I connected with Shelly [name] on 08/30/19 at  5:30 PM EDT by telephone and verified that I am speaking with the correct person using two identifiers.   I discussed the limitations, risks, security and privacy concerns of performing an evaluation and management service by telephone and the availability of in person appointments. I also discussed with the patient that there may be a patient responsible charge related to this service. The patient expressed understanding and agreed to proceed.  Location patient: home Location provider: work or home office Participants present for the call: patient, provider Patient did not have a visit in the prior 7 days to address this/these issue(s).   History of Present Illness: Ms. Shelly [name] called to discuss issues with anxiety and insomnia.  She does had breast biopsy that revealed invasive ductal carcinoma grade 2 of the left breast.  She has visit with surgeon soon.  She felt that she was handling the diagnosis fairly well initially but has been more anxious since then.  She states last night she got almost no sleep whatsoever.  She has tried things like melatonin in the past without relief.  She is avoiding daytime napping.  Does not drink any caffeine except for 2 cups of coffee in the morning.  Does drink about 1 glass of wine at night.  She has taken very low-dose lorazepam for similar situation in the past with severe insomnia which worked well   Observations/Objective: Patient sounds cheerful and well on the phone. I do not appreciate any SOB. Speech and thought processing are grossly intact. Patient reported vitals:  Assessment and Plan:  Situational stress/anxiety.  She has some severe insomnia associated with  this.  -Discussed nonpharmacologic management with no late day use of caffeine, avoidance of alcohol, avoidance of bright lights at night, etc.  -Agreed to short-term only use of low-dose lorazepam 0.5 mg 1 nightly as needed #20 with no refill.  Discussed potential adverse effects such as balance issues -will recommend counseling if anxiety persists.   Follow Up Instructions:  - as needed.   [id number] [id number] [id number] 11-20 [id number] 21-30 I did not refer this patient for an OV in the next 24 hours for this/these issue(s).  I discussed the assessment and treatment plan with the patient. The patient was provided an opportunity to ask questions and all were answered. The patient agreed with the plan and demonstrated an understanding of the instructions.   The patient was advised to call back or seek an in-person evaluation if the symptoms worsen or if the condition fails to improve as anticipated.  I provided 14 minutes of non-face-to-face time during this encounter.   Carolann Littler, MD

## 2019-08-30 NOTE — Telephone Encounter (Signed)
Copied from [place] [id number]. Topic: General - Inquiry >> Aug 30, 2019  8:30 AM Richardo Priest, NT wrote: Reason for CRM: Patient called in stating she would like if PCP can prescribe something for anxiety and sleep, due to her finding out she has breast cancer. Patient feels like her mind is racing and has not slept since she had a biopsy. Please advise.

## 2019-08-30 NOTE — Telephone Encounter (Signed)
Set up Doxy or phone visit.

## 2019-09-01 ENCOUNTER — Ambulatory Visit
Admission: RE | Admit: 2019-09-01 | Discharge: 2019-09-01 | Disposition: A | Discharge status: Home / Self Care | Payer: Medicare Other | Source: Ambulatory Visit | Attending: Family Medicine | Admitting: Family Medicine

## 2019-09-01 ENCOUNTER — Other Ambulatory Visit: Payer: Self-pay

## 2019-09-01 ENCOUNTER — Other Ambulatory Visit: Payer: Self-pay | Admitting: Diagnostic Radiology

## 2019-09-01 DIAGNOSIS — N62 Hypertrophy of breast: Secondary | ICD-10-CM | POA: Diagnosis not present

## 2019-09-01 DIAGNOSIS — N6489 Other specified disorders of breast: Secondary | ICD-10-CM

## 2019-09-01 DIAGNOSIS — N6321 Unspecified lump in the left breast, upper outer quadrant: Secondary | ICD-10-CM | POA: Diagnosis not present

## 2019-09-05 ENCOUNTER — Ambulatory Visit: Payer: Self-pay | Admitting: General Surgery

## 2019-09-05 DIAGNOSIS — C50412 Malignant neoplasm of upper-outer quadrant of left female breast: Secondary | ICD-10-CM | POA: Diagnosis not present

## 2019-09-05 DIAGNOSIS — N6021 Fibroadenosis of right breast: Secondary | ICD-10-CM | POA: Diagnosis not present

## 2019-09-05 DIAGNOSIS — Z170 Estrogen receptor positive status [ER+]: Secondary | ICD-10-CM | POA: Diagnosis not present

## 2019-09-05 DIAGNOSIS — D242 Benign neoplasm of left breast: Secondary | ICD-10-CM

## 2019-09-07 ENCOUNTER — Other Ambulatory Visit: Payer: Self-pay | Admitting: General Surgery

## 2019-09-07 DIAGNOSIS — N6021 Fibroadenosis of right breast: Secondary | ICD-10-CM

## 2019-09-08 ENCOUNTER — Telehealth: Payer: Self-pay | Admitting: Hematology

## 2019-09-08 NOTE — Telephone Encounter (Signed)
Received a new patient referral from Dr. Marlou Starks at [facility] for breat cancer. Ms. Shelly [name] has been cld and scheduled to see Dr. Burr Medico on 11/2 at 3pm. She's been made aware to arrive 15 minutes early.

## 2019-09-13 NOTE — Progress Notes (Signed)
Shelly [name]   [phone] Fax:([phone]) [place] Note   Patient Care Team: Eulas Post, MD as PCP - General (Family Medicine) Barbaraann Cao, OD as Referring Physician (Optometry)  Date of Service:  09/18/2019   CHIEF COMPLAINTS/PURPOSE OF CONSULTATION:  Newly diagnosed Cancer of central portion of left female breast   REFERRING PHYSICIAN:  Dr Marlou Starks   Oncology History  Cancer of central portion of left female breast ([place])  08/24/2019 Mammogram   Diagnostic mammogram 08/24/19  IMPRESSION: 1. Left breast mass measuring 0.8 x 0.4 x 0.5 cm at the 1 o'clock retroareolar region is suspicious and likely corresponds to the area of distortion seen on mammography.   2. Left breast 3 o'clock retroareolar dilated duct with internal debris versus solid mass.   3. Right breast retroareolar focal asymmetry without sonographic correlate.   08/28/2019 Initial Biopsy   Final Diagnosis 08/28/19 1. Breast, left, needle core biopsy, 1 o'clock/retroareolar - INVASIVE DUCTAL CARCINOMA, GRADE II. - PERINEURAL INVASION. - SEE MICROSCOPIC DESCRIPTION. 2. Breast, left, needle core biopsy, 3 o'clock retroareolar - DUCTAL PAPILLOMA. - SEE MICROSCOPIC DESCRIPTION.   08/28/2019 Receptors her2   1. PROGNOSTIC INDICATORS 08/28/19 Results: IMMUNOHISTOCHEMICAL AND MORPHOMETRIC ANALYSIS PERFORMED MANUALLY The tumor cells are NEGATIVE for Her2 (0). Estrogen Receptor: 100%, POSITIVE, STRONG STAINING INTENSITY Progesterone Receptor: 50%, POSITIVE, STRONG STAINING INTENSITY Proliferation Marker Ki67: 12%   09/01/2019 Pathology Results   Diagnosis 09/01/19 Breast, right, needle core biopsy, retroareolar - COMPLEX SCLEROSING LESION WITH A PAPILLARY SCLEROSING LESION AND USUAL DUCTAL HYPERPLASIA. SEE NOTE - NEGATIVE FOR CARCINOMA   09/18/2019 Initial Diagnosis   Cancer of central portion of left female breast ([place])   09/18/2019 Cancer Staging   Staging form: Breast, AJCC 8th Edition - Clinical stage from 09/18/2019: Stage IA (cT1b, cN0, cM0, G2, ER+, PR+, HER2-) - Signed by Truitt Merle, MD on 09/18/2019      HISTORY OF PRESENTING ILLNESS:  Shelly [name] 73 y.o. female is a here because of newly diagnosed left breast cancer. The patient was referred by Dr Marlou Starks. The patient presents to the clinic today with her husband.   She notes her mass was found by screening mammogram. She did not feel her mass herself. She notes this years mammogram was postponed 3 months given COVID. She notes her maternal and 1 cousin had breast cancer, all on her mother's side. She knows to have yearly mammograms. This is her first biopsy and never had breast surgery before. She notes having left breast discomfort only since biopsy. No other new breast, bone or body changes. She overall feels at baseline.   Socially she is married with 1 adult daughter in her 64s. She was a light smoker in the past, has quit. She drinks 2 alcoholic beverages. She moved from [place] to [place] in 2015 where her daughter lives.   They have a PMHx of osteoporosis. She is on Prolia injection every 6 months. She does have some bone pain from this. She notes having Acid reflux/GERD. She uses Nexium as needed. She notes her menopause was at age 55 and went well overall. She notes she has not seen her gyn in 1.5 years.  She notes her mother had leukemia, her father had lung cancer, A PGM had uterine cancer, MGF had stomach or lung cancer.     REVIEW OF SYSTEMS:   Constitutional: Denies fevers, chills or abnormal night sweats Eyes: Denies blurriness of vision, double vision or watery eyes Ears,  nose, mouth, throat, and face: Denies mucositis or sore throat Respiratory: Denies cough, dyspnea or wheezes Cardiovascular: Denies palpitation, chest discomfort or lower extremity swelling Gastrointestinal:  Denies nausea or change in bowel habits (+) GERD Skin: Denies abnormal skin  rashes Lymphatics: Denies new lymphadenopathy or easy bruising Neurological:Denies numbness, tingling or new weaknesses Behavioral/Psych: Mood is stable, no new changes  All other systems were reviewed with the patient and are negative.   MEDICAL HISTORY:  Past Medical History:  Diagnosis Date   Arthritis    wrist, shoulder    Diverticulitis    Endometrial polyp    GERD (gastroesophageal reflux disease)    occasional - diet controlled   Hx of adenomatous colonic polyps    Hyperlipidemia    IBS (irritable bowel syndrome)    Missed abortion    x 1 - resolved - no surgery   Osteoporosis    SVD (spontaneous vaginal delivery)    x 1   Transient vision disturbance 2019   was evaluated by ED, no findings   Vitamin D deficiency     SURGICAL HISTORY: Past Surgical History:  Procedure Laterality Date   COLONOSCOPY     DIAGNOSTIC LAPAROSCOPY     cysts   DILATATION & CURETTAGE/HYSTEROSCOPY WITH TRUECLEAR N/A 10/31/2013   Procedure: DILATATION & CURETTAGE/HYSTEROSCOPY WITH TRUCLEAR;  Surgeon: Marylynn Pearson, MD;  Location: [facility];  Service: Gynecology;  Laterality: N/A;   DILATION AND CURETTAGE OF UTERUS     TONSILLECTOMY  1952   WISDOM TOOTH EXTRACTION      SOCIAL HISTORY: Social History   Socioeconomic History   Marital status: Married    Spouse name: Not on file   Number of children: 1   Years of education: Not on file   Highest education level: Not on file  Occupational History   Not on file  Social Needs   Financial resource strain: Not hard at all   Food insecurity    Worry: Never true    Inability: Never true   Transportation needs    Medical: No    Non-medical: No  Tobacco Use   Smoking status: Former Smoker    Packs/day: 0.10    Years: 20.00    Pack years: 2.00    Types: Cigarettes    Quit date: 05/15/2007    Years since quitting: 12.3   Smokeless tobacco: Never Used  Substance and Sexual Activity   Alcohol use: Yes     Alcohol/week: 14.0 standard drinks    Types: 14 Glasses of wine per week    Comment: 2 glasses wine every evening   Drug use: No   Sexual activity: Yes    Birth control/protection: Post-menopausal  Lifestyle   Physical activity    Days per week: 0 days    Minutes per session: 0 min   Stress: Rather much  Relationships   Social connections    Talks on phone: Three times a week    Gets together: Once a week    Attends religious service: Not on file    Active member of club or organization: Not on file    Attends meetings of clubs or organizations: Not on file    Relationship status: Married   Intimate partner violence    Fear of current or ex partner: No    Emotionally abused: No    Physically abused: No    Forced sexual activity: No  Other Topics Concern   Not on file  Social History Narrative  Lives with husband in 2 story house   Has one daughter, with 3 grandchildren, local, supportive   Enjoys gardening/walking outside with husband   Occasionally does home exercise calisthenics but not recently.   Well-balanced diet overall    FAMILY HISTORY: Family History  Problem Relation Age of Onset   Hyperlipidemia Mother    Hypertension Mother    Diabetes Mother        type ll   Leukemia Mother    Lung cancer Father    Hyperlipidemia Father    Breast cancer Cousin 14   Heart disease Other    Hypertension Other    Cancer Other    Depression Brother    Breast cancer Maternal Aunt    Cancer Maternal Aunt    Uterine cancer Paternal Grandmother    Breast cancer Maternal Aunt    Lung cancer Maternal Grandfather    Cancer Maternal Grandfather        gastric cancer?    ALLERGIES:  is allergic to crestor [rosuvastatin calcium]; lipitor [atorvastatin calcium]; nsaids; pravastatin; and sulfonamide derivatives.  MEDICATIONS:  Current Outpatient Medications  Medication Sig Dispense Refill   acetaminophen (TYLENOL) 500 MG tablet Take 500 mg by mouth  every 6 (six) hours as needed for headache.     bismuth subsalicylate (PEPTO BISMOL) 262 MG/15ML suspension Take 30 mLs by mouth every 6 (six) hours as needed.     clidinium-chlordiazePOXIDE (LIBRAX) 5-2.5 MG capsule Take 1 capsule by mouth every 8 (eight) hours as needed (bowels). 60 capsule 0   loratadine (CLARITIN) 10 MG tablet Take 10 mg by mouth daily as needed for allergies.      LORazepam (ATIVAN) 0.5 MG tablet Take 1 tablet (0.5 mg total) by mouth every 8 (eight) hours as needed for anxiety. 20 tablet 0   esomeprazole (NEXIUM) 20 MG capsule Take 20 mg by mouth daily as needed (acid reflux).      No current facility-administered medications for this visit.     PHYSICAL EXAMINATION: ECOG PERFORMANCE STATUS: 0 - Asymptomatic  Vitals:   09/18/19 1458  BP: (!) 156/63  Pulse: 84  Resp: 18  Temp: 98.2 F (36.8 C)  SpO2: 97%   Filed Weights   09/18/19 1458  Weight: 179 lb 14.4 oz (81.6 kg)    GENERAL:alert, no distress and comfortable SKIN: skin color, texture, turgor are normal, no rashes or significant lesions (+) Multiple skin moles across breasts EYES: normal, Conjunctiva are pink and non-injected, sclera clear  NECK: supple, thyroid normal size, non-tender, without nodularity LYMPH:  no palpable lymphadenopathy in the cervical, axillary  LUNGS: clear to auscultation and percussion with normal breathing effort HEART: regular rate and no lower extremity edema (+) Mild heart murmur  ABDOMEN:abdomen soft, non-tender and normal bowel sounds Musculoskeletal:no cyanosis of digits and no clubbing  NEURO: alert & oriented x 3 with fluent speech, no focal motor/sensory deficits BREAST: (+) palpable 1.5x2cm from 1:00-5:00 position around left nipple, likely bleeding from biopsy. No palpable mass, nodules or adenopathy bilaterally. Breast exam benign.  LABORATORY DATA:  I have reviewed the data as listed CBC Latest Ref Rng & Units 11/17/2018 08/21/2014 10/27/2013  WBC 4.0 - 10.5  K/uL 6.9 7.0 6.6  Hemoglobin 12.0 - 15.0 g/dL 14.7 13.4 13.6  Hematocrit 36.0 - 46.0 % 47.0(H) 40.0 40.2  Platelets 150 - 400 K/uL 308 337.0 320    CMP Latest Ref Rng & Units 11/17/2018 01/07/2018 05/18/2016  Glucose 70 - 99 mg/dL 105(H) 113(H) 114(H)  BUN  8 - 23 mg/dL 13 22 15   Creatinine 0.44 - 1.00 mg/dL 0.84 0.88 0.86  Sodium 135 - 145 mmol/L 140 140 137  Potassium 3.5 - 5.1 mmol/L 4.1 4.3 3.9  Chloride 98 - 111 mmol/L 105 105 104  CO2 22 - 32 mmol/L 24 29 29   Calcium 8.9 - 10.3 mg/dL 9.3 9.4 9.3  Total Protein 6.5 - 8.1 g/dL 7.3 - -  Total Bilirubin 0.3 - 1.2 mg/dL 0.2(L) - -  Alkaline Phos 38 - 126 U/L 64 - -  AST 15 - 41 U/L 18 - -  ALT 0 - 44 U/L 19 - -     RADIOGRAPHIC STUDIES: I have personally reviewed the radiological images as listed and agreed with the findings in the report. US Breast Ltd Uni Left Inc Axilla  Result Date: 08/24/2019 CLINICAL DATA:  Patient presents as a recall from screen for possible bilateral areas of distortion. EXAM: DIGITAL DIAGNOSTIC BILATERAL MAMMOGRAM WITH CAD AND TOMO ULTRASOUND BILATERAL BREAST COMPARISON:  Previous exam(s). ACR Breast Density Category c: The breast tissue is heterogeneously dense, which may obscure small masses. FINDINGS: Mammogram: Right breast: Additional spot compression tomosynthesis views were performed for the possible distortion in the retroareolar right breast. On the additional views there is focal asymmetry in the retroareolar area without definite underlying mass or distortion. Left breast: Additional spot compression tomosynthesis views were performed for the possible distortion in the left retroareolar breast. There is persistence of architectural distortion measuring approximately 1.0 cm. Mammographic images were processed with CAD. Ultrasound: Right: Targeted ultrasound is performed in the right retroareolar breast demonstrating no discrete cystic or solid abnormality. There is no sonographic evidence of distortion in  the tissues. Left: Targeted ultrasound of the left 1 o'clock region demonstrates a small hypoechoic spiculated mass measuring 0.8 x 0.4 x 0.5 cm. This likely corresponds to the distortion seen on mammography. Additionally there is a dilated duct in the retroareolar/3 o'clock position with a small amount of internal debris versus solid mass. No color flow was elicited within the duct. Targeted ultrasound of the left axilla demonstrates normal-appearing lymph nodes. IMPRESSION: 1. Left breast mass measuring 0.8 cm at the 1 o'clock retroareolar region is suspicious and likely corresponds to the area of distortion seen on mammography. 2. Left breast 3 o'clock retroareolar dilated duct with internal debris versus solid mass. 3. Right breast retroareolar focal asymmetry without sonographic correlate. RECOMMENDATION: 1. Ultrasound-guided core needle biopsy of the left breast 1 o'clock/retroareolar mass. Recommend correlation with the architectural distortion seen on mammography on the post biopsy clip films. 2. Ultrasound-guided core needle biopsy of the 3 o'clock/retroareolar dilated duct targeting the more solid-appearing component. 3. Six-month follow-up diagnostic mammogram and possible ultrasound for the right breast retroareolar focal asymmetry. However if either of the two left-sided biopsies returns as atypia or malignancy, recommend stereotactic core needle biopsy of the right breast focal asymmetry. I have discussed the findings and recommendations with the patient. If applicable, a reminder letter will be sent to the patient regarding the next appointment. BI-RADS CATEGORY  4: Suspicious. Electronically Signed   By: Audie Pinto M.D.   On: 08/24/2019 14:06   US Breast Ltd Uni Right Inc Axilla  Result Date: 08/24/2019 CLINICAL DATA:  Patient presents as a recall from screen for possible bilateral areas of distortion. EXAM: DIGITAL DIAGNOSTIC BILATERAL MAMMOGRAM WITH CAD AND TOMO ULTRASOUND BILATERAL  BREAST COMPARISON:  Previous exam(s). ACR Breast Density Category c: The breast tissue is heterogeneously dense, which may obscure small  masses. FINDINGS: Mammogram: Right breast: Additional spot compression tomosynthesis views were performed for the possible distortion in the retroareolar right breast. On the additional views there is focal asymmetry in the retroareolar area without definite underlying mass or distortion. Left breast: Additional spot compression tomosynthesis views were performed for the possible distortion in the left retroareolar breast. There is persistence of architectural distortion measuring approximately 1.0 cm. Mammographic images were processed with CAD. Ultrasound: Right: Targeted ultrasound is performed in the right retroareolar breast demonstrating no discrete cystic or solid abnormality. There is no sonographic evidence of distortion in the tissues. Left: Targeted ultrasound of the left 1 o'clock region demonstrates a small hypoechoic spiculated mass measuring 0.8 x 0.4 x 0.5 cm. This likely corresponds to the distortion seen on mammography. Additionally there is a dilated duct in the retroareolar/3 o'clock position with a small amount of internal debris versus solid mass. No color flow was elicited within the duct. Targeted ultrasound of the left axilla demonstrates normal-appearing lymph nodes. IMPRESSION: 1. Left breast mass measuring 0.8 cm at the 1 o'clock retroareolar region is suspicious and likely corresponds to the area of distortion seen on mammography. 2. Left breast 3 o'clock retroareolar dilated duct with internal debris versus solid mass. 3. Right breast retroareolar focal asymmetry without sonographic correlate. RECOMMENDATION: 1. Ultrasound-guided core needle biopsy of the left breast 1 o'clock/retroareolar mass. Recommend correlation with the architectural distortion seen on mammography on the post biopsy clip films. 2. Ultrasound-guided core needle biopsy of the 3  o'clock/retroareolar dilated duct targeting the more solid-appearing component. 3. Six-month follow-up diagnostic mammogram and possible ultrasound for the right breast retroareolar focal asymmetry. However if either of the two left-sided biopsies returns as atypia or malignancy, recommend stereotactic core needle biopsy of the right breast focal asymmetry. I have discussed the findings and recommendations with the patient. If applicable, a reminder letter will be sent to the patient regarding the next appointment. BI-RADS CATEGORY  4: Suspicious. Electronically Signed   By: Audie Pinto M.D.   On: 08/24/2019 14:06   Mm Diag Breast Tomo Bilateral  Result Date: 08/24/2019 CLINICAL DATA:  Patient presents as a recall from screen for possible bilateral areas of distortion. EXAM: DIGITAL DIAGNOSTIC BILATERAL MAMMOGRAM WITH CAD AND TOMO ULTRASOUND BILATERAL BREAST COMPARISON:  Previous exam(s). ACR Breast Density Category c: The breast tissue is heterogeneously dense, which may obscure small masses. FINDINGS: Mammogram: Right breast: Additional spot compression tomosynthesis views were performed for the possible distortion in the retroareolar right breast. On the additional views there is focal asymmetry in the retroareolar area without definite underlying mass or distortion. Left breast: Additional spot compression tomosynthesis views were performed for the possible distortion in the left retroareolar breast. There is persistence of architectural distortion measuring approximately 1.0 cm. Mammographic images were processed with CAD. Ultrasound: Right: Targeted ultrasound is performed in the right retroareolar breast demonstrating no discrete cystic or solid abnormality. There is no sonographic evidence of distortion in the tissues. Left: Targeted ultrasound of the left 1 o'clock region demonstrates a small hypoechoic spiculated mass measuring 0.8 x 0.4 x 0.5 cm. This likely corresponds to the distortion seen on  mammography. Additionally there is a dilated duct in the retroareolar/3 o'clock position with a small amount of internal debris versus solid mass. No color flow was elicited within the duct. Targeted ultrasound of the left axilla demonstrates normal-appearing lymph nodes. IMPRESSION: 1. Left breast mass measuring 0.8 cm at the 1 o'clock retroareolar region is suspicious and likely corresponds to the  area of distortion seen on mammography. 2. Left breast 3 o'clock retroareolar dilated duct with internal debris versus solid mass. 3. Right breast retroareolar focal asymmetry without sonographic correlate. RECOMMENDATION: 1. Ultrasound-guided core needle biopsy of the left breast 1 o'clock/retroareolar mass. Recommend correlation with the architectural distortion seen on mammography on the post biopsy clip films. 2. Ultrasound-guided core needle biopsy of the 3 o'clock/retroareolar dilated duct targeting the more solid-appearing component. 3. Six-month follow-up diagnostic mammogram and possible ultrasound for the right breast retroareolar focal asymmetry. However if either of the two left-sided biopsies returns as atypia or malignancy, recommend stereotactic core needle biopsy of the right breast focal asymmetry. I have discussed the findings and recommendations with the patient. If applicable, a reminder letter will be sent to the patient regarding the next appointment. BI-RADS CATEGORY  4: Suspicious. Electronically Signed   By: Audie Pinto M.D.   On: 08/24/2019 14:06   Mm Clip Placement Left  Result Date: 08/28/2019 CLINICAL DATA:  Patient presents for biopsy of a left breast mass and a left breast dilated duct with debris versus intraductal mass EXAM: DIAGNOSTIC LEFT MAMMOGRAM POST ULTRASOUND BIOPSY COMPARISON:  Previous exam(s). FINDINGS: Mammographic images were obtained following ultrasound guided biopsy of a left breast mass at the 1 o'clock retroareolar location as well as a left breast dilated duct  with intraductal debris versus solid mass at the 3 o'clock retroareolar location. The biopsy marking clips are in expected position at the sites of biopsy. IMPRESSION: Appropriate positioning of the ribbon and coil shaped biopsy marking clips at the sites of biopsy in the 1 o'clock/retroareolar and 3 o'clock/retroareolar locations in the left breast. Final Assessment: Post Procedure Mammograms for Marker Placement Electronically Signed   By: Audie Pinto M.D.   On: 08/28/2019 15:05   Mm Clip Placement Right  Result Date: 09/01/2019 CLINICAL DATA:  Status post stereotactic guided core needle biopsy of a focal asymmetry in the retroareolar right breast. EXAM: DIAGNOSTIC RIGHT MAMMOGRAM POST STEREOTACTIC BIOPSY COMPARISON:  Previous exam(s). FINDINGS: Mammographic images were obtained following stereotactic guided biopsy of the recently demonstrated focal asymmetry in the retroareolar right breast period. The biopsy marking clip is in expected position at the site of biopsy. IMPRESSION: Appropriate positioning of the coil shaped shaped biopsy marking clip at the site of biopsy in the retroareolar right breast. Final Assessment: Post Procedure Mammograms for Marker Placement Electronically Signed   By: Claudie Revering M.D.   On: 09/01/2019 11:02   [place] Lt Breast Bx W Loc Dev 1st Lesion Img Bx Spec US Guide  Addendum Date: 08/31/2019   ADDENDUM REPORT: 08/30/2019 16:20 ADDENDUM: Pathology revealed GRADE II INVASIVE DUCTAL CARCINOMA. PERINEURAL INVASION of the LEFT breast, 1 o'clock/retroareolar. This was found to be concordant by Dr. Audie Pinto. Pathology revealed DUCTAL PAPILLOMA of the LEFT breast, 3 o'clock retroareoilar. This was found to be concordant by Dr. Audie Pinto, with excision recommended. Pathology results were discussed with the patient by telephone. The patient reported doing well after the biopsies with tenderness at the sites. Post biopsy instructions and care were reviewed and  questions were answered. The patient was encouraged to call [facility] for any additional concerns. Patient is scheduled for stereotactic biopsy of RIGHT breast asymmetry on September 01, 2019. Surgical consultation has been arranged with Dr. Autumn Messing at [facility] on September 05, 2019. Pathology results reported by Stacie Acres, RN on 08/30/2019. Electronically Signed   By: Audie Pinto M.D.   On:  08/30/2019 16:20   Result Date: 08/31/2019 CLINICAL DATA:  Patient presents for biopsy of a left breast mass as well as a left breast dilated duct with intraductal mass versus debris EXAM: ULTRASOUND GUIDED LEFT BREAST CORE NEEDLE BIOPSY x 2 COMPARISON:  Previous exam(s). FINDINGS: I met with the patient and we discussed the procedure of ultrasound-guided biopsy, including benefits and alternatives. We discussed the high likelihood of a successful procedure. We discussed the risks of the procedure, including infection, bleeding, tissue injury, clip migration, and inadequate sampling. Informed written consent was given. The usual time-out protocol was performed immediately prior to the procedure. 1.  Lesion quadrant: Upper outer quadrant Using sterile technique and 1% Lidocaine as local anesthetic, under direct ultrasound visualization, a 12 gauge spring-loaded device was used to perform biopsy of a mass at the 1 o'clock retroareolar location using a lateral approach. At the conclusion of the procedure ribbon tissue marker clip was deployed into the biopsy cavity. Follow up 2 view mammogram was performed and dictated separately. 2.  Lesion quadrant: Upper outer quadrant Using sterile technique and 1% Lidocaine as local anesthetic, under direct ultrasound visualization, a 14 gauge spring-loaded device was used to perform biopsy of a dilated duct with intraductal mass versus debris using a lateral approach. At the conclusion of the procedure coil tissue marker clip was deployed  into the biopsy cavity. Follow up 2 view mammogram was performed and dictated separately. IMPRESSION: Ultrasound guided biopsy of a left breast mass at the 1 o'clock retroareolar location as well as a left breast dilated duct at the 3 o'clock retroareolar location. No apparent complications. Electronically Signed: By: Audie Pinto M.D. On: 08/28/2019 15:07   [place] Lt Breast Bx W Loc Dev Ea Add Lesion Img Bx Spec US Guide  Addendum Date: 08/31/2019   ADDENDUM REPORT: 08/30/2019 16:20 ADDENDUM: Pathology revealed GRADE II INVASIVE DUCTAL CARCINOMA. PERINEURAL INVASION of the LEFT breast, 1 o'clock/retroareolar. This was found to be concordant by Dr. Audie Pinto. Pathology revealed DUCTAL PAPILLOMA of the LEFT breast, 3 o'clock retroareoilar. This was found to be concordant by Dr. Audie Pinto, with excision recommended. Pathology results were discussed with the patient by telephone. The patient reported doing well after the biopsies with tenderness at the sites. Post biopsy instructions and care were reviewed and questions were answered. The patient was encouraged to call [facility] for any additional concerns. Patient is scheduled for stereotactic biopsy of RIGHT breast asymmetry on September 01, 2019. Surgical consultation has been arranged with Dr. Autumn Messing at [facility] on September 05, 2019. Pathology results reported by Stacie Acres, RN on 08/30/2019. Electronically Signed   By: Audie Pinto M.D.   On: 08/30/2019 16:20   Result Date: 08/31/2019 CLINICAL DATA:  Patient presents for biopsy of a left breast mass as well as a left breast dilated duct with intraductal mass versus debris EXAM: ULTRASOUND GUIDED LEFT BREAST CORE NEEDLE BIOPSY x 2 COMPARISON:  Previous exam(s). FINDINGS: I met with the patient and we discussed the procedure of ultrasound-guided biopsy, including benefits and alternatives. We discussed the high likelihood of a successful  procedure. We discussed the risks of the procedure, including infection, bleeding, tissue injury, clip migration, and inadequate sampling. Informed written consent was given. The usual time-out protocol was performed immediately prior to the procedure. 1.  Lesion quadrant: Upper outer quadrant Using sterile technique and 1% Lidocaine as local anesthetic, under direct ultrasound visualization, a 12 gauge spring-loaded device was used to  perform biopsy of a mass at the 1 o'clock retroareolar location using a lateral approach. At the conclusion of the procedure ribbon tissue marker clip was deployed into the biopsy cavity. Follow up 2 view mammogram was performed and dictated separately. 2.  Lesion quadrant: Upper outer quadrant Using sterile technique and 1% Lidocaine as local anesthetic, under direct ultrasound visualization, a 14 gauge spring-loaded device was used to perform biopsy of a dilated duct with intraductal mass versus debris using a lateral approach. At the conclusion of the procedure coil tissue marker clip was deployed into the biopsy cavity. Follow up 2 view mammogram was performed and dictated separately. IMPRESSION: Ultrasound guided biopsy of a left breast mass at the 1 o'clock retroareolar location as well as a left breast dilated duct at the 3 o'clock retroareolar location. No apparent complications. Electronically Signed: By: Audie Pinto M.D. On: 08/28/2019 15:07   Mm Rt Breast Bx W Loc Dev 1st Lesion Image Bx Spec Stereo Guide  Addendum Date: 09/04/2019   ADDENDUM REPORT: 09/04/2019 11:20 ADDENDUM: PATHOLOGY revealed: Breast, right - COMPLEX SCLEROSING LESION WITH A PAPILLARY SCLEROSING LESION AND USUAL DUCTAL HYPERPLASIA. SEE NOTE- NEGATIVE FOR CARCINOMA Pathology results are CONCORDANT with imaging findings, per Dr. Claudie Revering. Pathology results were discussed with patient via telephone. The patient reported doing well after the biopsy with no adverse symptoms, only tenderness at  the site. Post biopsy care instructions were reviewed and questions were answered. The patient was encouraged to call [facility] [place] for any additional questions or concerns. Recommendation: Surgical excision. The patient has surgical consultation arranged with Dr. Autumn Messing at [facility] on 09/05/2019. Addendum by Electa Sniff RN on 09/04/2019. Electronically Signed   By: Claudie Revering M.D.   On: 09/04/2019 11:20   Result Date: 09/04/2019 CLINICAL DATA:  Focal asymmetry in the retroareolar right breast at recent mammography with no ultrasound correlate. Recently diagnosed left breast invasive ductal carcinoma. EXAM: RIGHT BREAST STEREOTACTIC CORE NEEDLE BIOPSY COMPARISON:  Previous exams. FINDINGS: The patient and I discussed the procedure of stereotactic-guided biopsy including benefits and alternatives. We discussed the high likelihood of a successful procedure. We discussed the risks of the procedure including infection, bleeding, tissue injury, clip migration, and inadequate sampling. Informed written consent was given. The usual time out protocol was performed immediately prior to the procedure. Using sterile technique and 1% Lidocaine as local anesthetic, under stereotactic guidance, a 9 gauge vacuum assisted device was used to perform core needle biopsy of the recently demonstrated focal asymmetry in the retroareolar right breast using a lateral approach. At the conclusion of the procedure, coil shaped tissue marker clip was deployed into the biopsy cavity. Follow-up 2-view mammogram was performed and dictated separately. IMPRESSION: Stereotactic-guided biopsy of the recently demonstrated focal asymmetry in the retroareolar right breast. No apparent complications. Electronically Signed: By: Claudie Revering M.D. On: 09/01/2019 10:50    ASSESSMENT & PLAN:  Shelly [name] is a 73 y.o. caucasian female with a history of Arthritis, Diverticulitis, GERD, HLD, IBS,  Osteoporosis   1 Cancer of central portion of left female breast, Stage IA, c(T1bN0M0), ER/PR+, HER2-, Grade II -We discussed her image findings and the biopsy results in great details. Her Imaging showed her to have 2 left breast masses and a right breast focal asymmetry. Her right and left breast biopsies show papilloma at 3:00 position of left breast, benign, and invasive ductal carcinoma at 1:00 position of left breast, grade II.  -Given the early  stage disease, she likely need a lumpectomy w or wo SLNB (due to her advanced age). She is agreeable with that. She was seen by Dr. Marlou Starks previously and will proceed with surgery on 11/202/0. Dr. Marlou Starks also recommend right lumpectomy.  -I discussed the risk of recurrence left breast surgery.  I discussed using Oncotype Dx test on the surgical sample to predict her risk of distant recurrence in the future, and we'll make a decision about adjuvant chemotherapy based on the Oncotype result. Written material of this test was given to her.  Due to her advanced age, unless.  Current score is very high, I would likely not recommend chemotherapy. -She will think about if she wants to pursue Oncotype -The risk of recurrence depends on the stage and biology of the tumor. She is early stage, with ER/PR positive and HER2 negative markers. I discussed this is the more common type of slow growing tumor.  -She will be seen by radiation oncologist Dr. Isidore Moos next week to discuss adjuvant radiation. -Given the strong ER and PR expression in her postmenopausal status, I recommend adjuvant endocrine therapy with aromatase inhibitor anastrozole or Tamoxifen for a total of 5 years to reduce the risk of cancer recurrence. Potential benefits and side effects were discussed with patient and she is interested.  Due to her severe osteoporosis and arthritis, tamoxifen may be a better option. -We also discussed the breast cancer surveillance after her surgery. She will continue annual  screening mammogram, self exam, and a routine office visit with lab and exam with [place]. -I encouraged her to have healthy diet and exercise regularly -physical exam today shows mild heart murmur and 1.5x2cm lump around left nipple. Per patient this mass was not present until after her biopsy, which is the likely cause.  -F/u after surgery or radiation   2. Genetic Testing  -She has 3 maternal family members who had breast cancer and overall significant family history of cancer. She is eligible for genetic testing to evaluate for pathogenetic mutations. She is agreeable. I will refer her.  -I discussed if positive for pathogenetic mutations I would encouraged her daughter to be tested and prophylactic surgeries may be recommended for the patient.    3. Osteoporosis  -Diagnosed prior to 2007 in [place] in 06/2015 (-3.2 at [facility]) -On Prolia injections every 6 months since 2017-2018 -I recommend she have updated baseline DEXA before start of antiestrogen therapy.    PLAN:  -send genetic referral -F/u after surgery or radiation -She will call me back about her decision on Oncotype before surgery    No orders of the defined types were placed in this encounter.   All questions were answered. The patient knows to call the clinic with any problems, questions or concerns. I spent 40 minutes counseling the patient face to face. The total time spent in the appointment was 50 minutes and more than 50% was on counseling.     Truitt Merle, MD 09/18/2019 5:59 PM  I, Joslyn Devon, am acting as scribe for Truitt Merle, MD.   I have reviewed the above documentation for accuracy and completeness, and I agree with the above.

## 2019-09-17 HISTORY — PX: BREAST LUMPECTOMY: SHX2

## 2019-09-18 ENCOUNTER — Inpatient Hospital Stay: Payer: Medicare Other | Attending: Hematology | Admitting: Hematology

## 2019-09-18 ENCOUNTER — Other Ambulatory Visit: Payer: Self-pay

## 2019-09-18 ENCOUNTER — Encounter: Payer: Self-pay | Admitting: Hematology

## 2019-09-18 DIAGNOSIS — K589 Irritable bowel syndrome without diarrhea: Secondary | ICD-10-CM | POA: Diagnosis not present

## 2019-09-18 DIAGNOSIS — Z170 Estrogen receptor positive status [ER+]: Secondary | ICD-10-CM | POA: Diagnosis not present

## 2019-09-18 DIAGNOSIS — Z86010 Personal history of colonic polyps: Secondary | ICD-10-CM | POA: Diagnosis not present

## 2019-09-18 DIAGNOSIS — C50112 Malignant neoplasm of central portion of left female breast: Secondary | ICD-10-CM | POA: Diagnosis not present

## 2019-09-18 DIAGNOSIS — K219 Gastro-esophageal reflux disease without esophagitis: Secondary | ICD-10-CM | POA: Insufficient documentation

## 2019-09-18 DIAGNOSIS — M810 Age-related osteoporosis without current pathological fracture: Secondary | ICD-10-CM | POA: Diagnosis not present

## 2019-09-19 ENCOUNTER — Telehealth: Payer: Self-pay | Admitting: Hematology

## 2019-09-19 ENCOUNTER — Telehealth: Payer: Self-pay | Admitting: *Deleted

## 2019-09-19 NOTE — Progress Notes (Signed)
Location of Breast Cancer: Left Breast  Histology per Pathology Report:  08/28/19 Diagnosis 1. Breast, left, needle core biopsy, 1 o'clock/retroareolar - INVASIVE DUCTAL CARCINOMA, GRADE II. - PERINEURAL INVASION. - SEE MICROSCOPIC DESCRIPTION. 2. Breast, left, needle core biopsy, 3 o'clock retroareolar - DUCTAL PAPILLOMA. - SEE MICROSCOPIC DESCRIPTION.  Receptor Status: ER(100%), PR (50%), Her2-neu (NEG), Ki-(12)  Did patient present with symptoms or was this found on screening mammography?: It was found on a screening mammogram.   Past/Anticipated interventions by surgeon, if any: Dr. Toth plans surgery on 10/06/19  Past/Anticipated interventions by medical oncology, if any: Dr. Feng 09/18/19  PLAN:  -send genetic referral -F/u after surgery or radiation -She will call me back about her decision on Oncotype before surgery    Lymphedema issues, if any:  N/A  Pain issues, if any:  She reports discomfort at her biopsy site.   SAFETY ISSUES:  Prior radiation? No  Pacemaker/ICD? No  Possible current pregnancy? No  Is the patient on methotrexate? No  Current Complaints / other details:      Malmfelt, Jennifer L, RN 09/19/2019,2:22 PM   

## 2019-09-19 NOTE — Telephone Encounter (Signed)
Called pt to provided navigation resources and contact information. Pt denies questions or concerns regarding dx or treatment care plan. Discussed Tamoxifen and oncotype testing further. Pt will call later this week to provide decision on oncotype testing. Denies further questions at time. Encourage pt to call with needs or concerns. Received verbal understanding.

## 2019-09-19 NOTE — Telephone Encounter (Signed)
No los per 11/2.

## 2019-09-21 ENCOUNTER — Telehealth: Payer: Self-pay | Admitting: *Deleted

## 2019-09-21 NOTE — Telephone Encounter (Signed)
Received vm from pt stating she has decided to move forward with oncotype testing. Return pt call and left vm stating I have received her msg and will order testing. Physician team notified.

## 2019-09-26 ENCOUNTER — Ambulatory Visit
Admission: RE | Admit: 2019-09-26 | Discharge: 2019-09-26 | Disposition: A | Discharge status: Home / Self Care | Payer: Medicare Other | Source: Ambulatory Visit | Attending: Radiation Oncology | Admitting: Radiation Oncology

## 2019-09-26 ENCOUNTER — Encounter: Payer: Self-pay | Admitting: Radiation Oncology

## 2019-09-26 ENCOUNTER — Other Ambulatory Visit: Payer: Self-pay

## 2019-09-26 DIAGNOSIS — Z803 Family history of malignant neoplasm of breast: Secondary | ICD-10-CM | POA: Diagnosis not present

## 2019-09-26 DIAGNOSIS — Z170 Estrogen receptor positive status [ER+]: Secondary | ICD-10-CM | POA: Diagnosis not present

## 2019-09-26 DIAGNOSIS — C50112 Malignant neoplasm of central portion of left female breast: Secondary | ICD-10-CM | POA: Diagnosis not present

## 2019-09-26 NOTE — Progress Notes (Signed)
Radiation Oncology         [phone] ________________________________  Initial outpatient Consultation by telephone as patient was unable to access MyChart video during pandemic precautions   Name: Anastasiya Gowin MRN: [id number]  Date: 09/26/2019  DOB: 1945/11/18  OY:DXAJOINOM, Alinda Sierras, MD  Jovita Kussmaul, MD   REFERRING PHYSICIAN: Autumn Messing III, MD  DIAGNOSIS:    ICD-10-CM   1. Malignant neoplasm of central portion of left breast in female, estrogen receptor positive ([place])  C50.112    Z17.0    Stage IA Left Breast UOQ Invasive Ductal Carcinoma, ER+ / PR+ / Her2-, Grade 2 Cancer Staging Cancer of central portion of left female breast [facility]) Staging form: Breast, AJCC 8th Edition - Clinical stage from 09/18/2019: Stage IA (cT1b, cN0, cM0, G2, ER+, PR+, HER2-) - Signed by Truitt Merle, MD on 09/18/2019   CHIEF COMPLAINT: Here to discuss management of left breast cancer  HISTORY OF PRESENT ILLNESS::Jakeya Gurneet Matarese is a 73 y.o. female who presented with breast abnormality on the following imaging: screening mammogram on the date of 08/18/2019.  Symptoms, if any, at that time, were: none.   Ultrasound of breast on 08/24/2019 revealed: suspicious 0.8 cm left breast mass at 1 o'clock; left breast 3 o'clock dilated duct with internal debris versus solid mass; right breast retroareolar focal asymmetry without sonographic correlate.   Biopsy of left breast on date of 08/28/2019 showed ductal papilloma at 3 o'clock, and at 1:00 there was  invasive ductal carcinoma with perineural invasion.  ER status: positive; PR status positive, Her2 status negative; Grade 2.   Of note, biopsy of the right breast asymmetry was performed on 09/01/2019 and showed a complex sclerosing lesion with a papillary sclerosing lesion and usual ductal hyperplasia.  She met with Dr. Burr Medico on 09/18/2019. She is scheduled for lumpectomy on 10/06/2019 under Dr. Marlou Starks. She plans to pursue Oncotype testing.  PREVIOUS  RADIATION THERAPY: No  PAST MEDICAL HISTORY:  has a past medical history of Arthritis, Diverticulitis, Endometrial polyp, GERD (gastroesophageal reflux disease), adenomatous colonic polyps, Hyperlipidemia, IBS (irritable bowel syndrome), Missed abortion, Osteoporosis, SVD (spontaneous vaginal delivery), Transient vision disturbance (2019), and Vitamin D deficiency.    PAST SURGICAL HISTORY: Past Surgical History:  Procedure Laterality Date   COLONOSCOPY     DIAGNOSTIC LAPAROSCOPY     cysts   DILATATION & CURETTAGE/HYSTEROSCOPY WITH TRUECLEAR N/A 10/31/2013   Procedure: DILATATION & CURETTAGE/HYSTEROSCOPY WITH TRUCLEAR;  Surgeon: Marylynn Pearson, MD;  Location: [facility];  Service: Gynecology;  Laterality: N/A;   DILATION AND CURETTAGE OF UTERUS     TONSILLECTOMY  1952   WISDOM TOOTH EXTRACTION      FAMILY HISTORY: family history includes Breast cancer in her maternal aunt and maternal aunt; Breast cancer (age of onset: 40) in her cousin; Cancer in her maternal aunt, maternal grandfather, and another family member; Depression in her brother; Diabetes in her mother; Heart disease in an other family member; Hyperlipidemia in her father and mother; Hypertension in her mother and another family member; Leukemia in her mother; Lung cancer in her father and maternal grandfather; Uterine cancer in her paternal grandmother.  SOCIAL HISTORY:  reports that she quit smoking about 12 years ago. Her smoking use included cigarettes. She has a 2.00 pack-year smoking history. She has never used smokeless tobacco. She reports current alcohol use of about 14.0 standard drinks of alcohol per week. She reports that she does not use drugs.  ALLERGIES: Crestor [rosuvastatin calcium], Lipitor [atorvastatin calcium],  Nsaids, Pravastatin, and Sulfonamide derivatives  MEDICATIONS:  Current Outpatient Medications  Medication Sig Dispense Refill   acetaminophen (TYLENOL) 500 MG tablet Take 500 mg by mouth every 6  (six) hours as needed for headache.     bismuth subsalicylate (PEPTO BISMOL) 262 MG/15ML suspension Take 30 mLs by mouth every 6 (six) hours as needed.     clidinium-chlordiazePOXIDE (LIBRAX) 5-2.5 MG capsule Take 1 capsule by mouth every 8 (eight) hours as needed (bowels). 60 capsule 0   loratadine (CLARITIN) 10 MG tablet Take 10 mg by mouth daily as needed for allergies.      esomeprazole (NEXIUM) 20 MG capsule Take 20 mg by mouth daily as needed (acid reflux).      LORazepam (ATIVAN) 0.5 MG tablet Take 1 tablet (0.5 mg total) by mouth every 8 (eight) hours as needed for anxiety. (Patient not taking: Reported on 09/26/2019) 20 tablet 0   No current facility-administered medications for this encounter.     REVIEW OF SYSTEMS: As above   PHYSICAL EXAM:  vitals were not taken for this visit.   General: Alert and oriented, in no acute distress   LABORATORY DATA:  Lab Results  Component Value Date   WBC 6.9 11/17/2018   HGB 14.7 11/17/2018   HCT 47.0 (H) 11/17/2018   MCV 89.7 11/17/2018   PLT 308 11/17/2018   CMP     Component Value Date/Time   NA 140 11/17/2018 1325   K 4.1 11/17/2018 1325   CL 105 11/17/2018 1325   CO2 24 11/17/2018 1325   GLUCOSE 105 (H) 11/17/2018 1325   BUN 13 11/17/2018 1325   CREATININE 0.84 11/17/2018 1325   CALCIUM 9.3 11/17/2018 1325   PROT 7.3 11/17/2018 1325   ALBUMIN 4.1 11/17/2018 1325   AST 18 11/17/2018 1325   ALT 19 11/17/2018 1325   ALKPHOS 64 11/17/2018 1325   BILITOT 0.2 (L) 11/17/2018 1325   GFRNONAA >60 11/17/2018 1325   GFRAA >60 11/17/2018 1325         RADIOGRAPHY: Mm Clip Placement Left  Result Date: 08/28/2019 CLINICAL DATA:  Patient presents for biopsy of a left breast mass and a left breast dilated duct with debris versus intraductal mass EXAM: DIAGNOSTIC LEFT MAMMOGRAM POST ULTRASOUND BIOPSY COMPARISON:  Previous exam(s). FINDINGS: Mammographic images were obtained following ultrasound guided biopsy of a left breast mass  at the 1 o'clock retroareolar location as well as a left breast dilated duct with intraductal debris versus solid mass at the 3 o'clock retroareolar location. The biopsy marking clips are in expected position at the sites of biopsy. IMPRESSION: Appropriate positioning of the ribbon and coil shaped biopsy marking clips at the sites of biopsy in the 1 o'clock/retroareolar and 3 o'clock/retroareolar locations in the left breast. Final Assessment: Post Procedure Mammograms for Marker Placement Electronically Signed   By: Audie Pinto M.D.   On: 08/28/2019 15:05   Mm Clip Placement Right  Result Date: 09/01/2019 CLINICAL DATA:  Status post stereotactic guided core needle biopsy of a focal asymmetry in the retroareolar right breast. EXAM: DIAGNOSTIC RIGHT MAMMOGRAM POST STEREOTACTIC BIOPSY COMPARISON:  Previous exam(s). FINDINGS: Mammographic images were obtained following stereotactic guided biopsy of the recently demonstrated focal asymmetry in the retroareolar right breast period. The biopsy marking clip is in expected position at the site of biopsy. IMPRESSION: Appropriate positioning of the coil shaped shaped biopsy marking clip at the site of biopsy in the retroareolar right breast. Final Assessment: Post Procedure Mammograms for Marker Placement Electronically  Signed   By: Claudie Revering M.D.   On: 09/01/2019 11:02   [place] Lt Breast Bx W Loc Dev 1st Lesion Img Bx Spec US Guide  Addendum Date: 08/31/2019   ADDENDUM REPORT: 08/30/2019 16:20 ADDENDUM: Pathology revealed GRADE II INVASIVE DUCTAL CARCINOMA. PERINEURAL INVASION of the LEFT breast, 1 o'clock/retroareolar. This was found to be concordant by Dr. Audie Pinto. Pathology revealed DUCTAL PAPILLOMA of the LEFT breast, 3 o'clock retroareoilar. This was found to be concordant by Dr. Audie Pinto, with excision recommended. Pathology results were discussed with the patient by telephone. The patient reported doing well after the biopsies with  tenderness at the sites. Post biopsy instructions and care were reviewed and questions were answered. The patient was encouraged to call [facility] for any additional concerns. Patient is scheduled for stereotactic biopsy of RIGHT breast asymmetry on September 01, 2019. Surgical consultation has been arranged with Dr. Autumn Messing at [facility] on September 05, 2019. Pathology results reported by Stacie Acres, RN on 08/30/2019. Electronically Signed   By: Audie Pinto M.D.   On: 08/30/2019 16:20   Result Date: 08/31/2019 CLINICAL DATA:  Patient presents for biopsy of a left breast mass as well as a left breast dilated duct with intraductal mass versus debris EXAM: ULTRASOUND GUIDED LEFT BREAST CORE NEEDLE BIOPSY x 2 COMPARISON:  Previous exam(s). FINDINGS: I met with the patient and we discussed the procedure of ultrasound-guided biopsy, including benefits and alternatives. We discussed the high likelihood of a successful procedure. We discussed the risks of the procedure, including infection, bleeding, tissue injury, clip migration, and inadequate sampling. Informed written consent was given. The usual time-out protocol was performed immediately prior to the procedure. 1.  Lesion quadrant: Upper outer quadrant Using sterile technique and 1% Lidocaine as local anesthetic, under direct ultrasound visualization, a 12 gauge spring-loaded device was used to perform biopsy of a mass at the 1 o'clock retroareolar location using a lateral approach. At the conclusion of the procedure ribbon tissue marker clip was deployed into the biopsy cavity. Follow up 2 view mammogram was performed and dictated separately. 2.  Lesion quadrant: Upper outer quadrant Using sterile technique and 1% Lidocaine as local anesthetic, under direct ultrasound visualization, a 14 gauge spring-loaded device was used to perform biopsy of a dilated duct with intraductal mass versus debris using a lateral  approach. At the conclusion of the procedure coil tissue marker clip was deployed into the biopsy cavity. Follow up 2 view mammogram was performed and dictated separately. IMPRESSION: Ultrasound guided biopsy of a left breast mass at the 1 o'clock retroareolar location as well as a left breast dilated duct at the 3 o'clock retroareolar location. No apparent complications. Electronically Signed: By: Audie Pinto M.D. On: 08/28/2019 15:07   [place] Lt Breast Bx W Loc Dev Ea Add Lesion Img Bx Spec US Guide  Addendum Date: 08/31/2019   ADDENDUM REPORT: 08/30/2019 16:20 ADDENDUM: Pathology revealed GRADE II INVASIVE DUCTAL CARCINOMA. PERINEURAL INVASION of the LEFT breast, 1 o'clock/retroareolar. This was found to be concordant by Dr. Audie Pinto. Pathology revealed DUCTAL PAPILLOMA of the LEFT breast, 3 o'clock retroareoilar. This was found to be concordant by Dr. Audie Pinto, with excision recommended. Pathology results were discussed with the patient by telephone. The patient reported doing well after the biopsies with tenderness at the sites. Post biopsy instructions and care were reviewed and questions were answered. The patient was encouraged to call [facility]  for any additional concerns. Patient is scheduled for stereotactic biopsy of RIGHT breast asymmetry on September 01, 2019. Surgical consultation has been arranged with Dr. Autumn Messing at [facility] on September 05, 2019. Pathology results reported by Stacie Acres, RN on 08/30/2019. Electronically Signed   By: Audie Pinto M.D.   On: 08/30/2019 16:20   Result Date: 08/31/2019 CLINICAL DATA:  Patient presents for biopsy of a left breast mass as well as a left breast dilated duct with intraductal mass versus debris EXAM: ULTRASOUND GUIDED LEFT BREAST CORE NEEDLE BIOPSY x 2 COMPARISON:  Previous exam(s). FINDINGS: I met with the patient and we discussed the procedure of ultrasound-guided biopsy,  including benefits and alternatives. We discussed the high likelihood of a successful procedure. We discussed the risks of the procedure, including infection, bleeding, tissue injury, clip migration, and inadequate sampling. Informed written consent was given. The usual time-out protocol was performed immediately prior to the procedure. 1.  Lesion quadrant: Upper outer quadrant Using sterile technique and 1% Lidocaine as local anesthetic, under direct ultrasound visualization, a 12 gauge spring-loaded device was used to perform biopsy of a mass at the 1 o'clock retroareolar location using a lateral approach. At the conclusion of the procedure ribbon tissue marker clip was deployed into the biopsy cavity. Follow up 2 view mammogram was performed and dictated separately. 2.  Lesion quadrant: Upper outer quadrant Using sterile technique and 1% Lidocaine as local anesthetic, under direct ultrasound visualization, a 14 gauge spring-loaded device was used to perform biopsy of a dilated duct with intraductal mass versus debris using a lateral approach. At the conclusion of the procedure coil tissue marker clip was deployed into the biopsy cavity. Follow up 2 view mammogram was performed and dictated separately. IMPRESSION: Ultrasound guided biopsy of a left breast mass at the 1 o'clock retroareolar location as well as a left breast dilated duct at the 3 o'clock retroareolar location. No apparent complications. Electronically Signed: By: Audie Pinto M.D. On: 08/28/2019 15:07   Mm Rt Breast Bx W Loc Dev 1st Lesion Image Bx Spec Stereo Guide  Addendum Date: 09/04/2019   ADDENDUM REPORT: 09/04/2019 11:20 ADDENDUM: PATHOLOGY revealed: Breast, right - COMPLEX SCLEROSING LESION WITH A PAPILLARY SCLEROSING LESION AND USUAL DUCTAL HYPERPLASIA. SEE NOTE- NEGATIVE FOR CARCINOMA Pathology results are CONCORDANT with imaging findings, per Dr. Claudie Revering. Pathology results were discussed with patient via telephone. The  patient reported doing well after the biopsy with no adverse symptoms, only tenderness at the site. Post biopsy care instructions were reviewed and questions were answered. The patient was encouraged to call [facility] [place] for any additional questions or concerns. Recommendation: Surgical excision. The patient has surgical consultation arranged with Dr. Autumn Messing at [facility] on 09/05/2019. Addendum by Electa Sniff RN on 09/04/2019. Electronically Signed   By: Claudie Revering M.D.   On: 09/04/2019 11:20   Result Date: 09/04/2019 CLINICAL DATA:  Focal asymmetry in the retroareolar right breast at recent mammography with no ultrasound correlate. Recently diagnosed left breast invasive ductal carcinoma. EXAM: RIGHT BREAST STEREOTACTIC CORE NEEDLE BIOPSY COMPARISON:  Previous exams. FINDINGS: The patient and I discussed the procedure of stereotactic-guided biopsy including benefits and alternatives. We discussed the high likelihood of a successful procedure. We discussed the risks of the procedure including infection, bleeding, tissue injury, clip migration, and inadequate sampling. Informed written consent was given. The usual time out protocol was performed immediately prior to the procedure. Using sterile technique and 1%  Lidocaine as local anesthetic, under stereotactic guidance, a 9 gauge vacuum assisted device was used to perform core needle biopsy of the recently demonstrated focal asymmetry in the retroareolar right breast using a lateral approach. At the conclusion of the procedure, coil shaped tissue marker clip was deployed into the biopsy cavity. Follow-up 2-view mammogram was performed and dictated separately. IMPRESSION: Stereotactic-guided biopsy of the recently demonstrated focal asymmetry in the retroareolar right breast. No apparent complications. Electronically Signed: By: Claudie Revering M.D. On: 09/01/2019 10:50      IMPRESSION/PLAN: Left breast cancer    For  the patient's early stage favorable risk breast cancer, we had a thorough discussion about her options for adjuvant therapy. One option would be antiestrogen therapy as discussed with medical oncology. She would take a pill for approximately 5 years. The alternative option (but less standard) would be radiotherapy to the breast. The most aggressive option would be to pursue both modalities.  Of note, I discussed the data from the [employer] al trial in the [place] [facility]. She understands that tamoxifen compared to radiation plus tamoxifen demonstrated no survival benefit among the women in this study. The women were 47 years or older with stage I estrogen receptor positive breast cancer. Based on this study, I told the patient that her overall life expectancy should not be affected by adding radiotherapy to antiestrogen medication. She understands that the main benefit of  adding radiotherapy to anti estrogen therapy would be a very small but measurable local control benefit (risk of local recurrence to be lowered from ~10% --> ~2% over a decade).  We discussed the fact that radiotherapy only provides a local control benefit while anti-estrogen pills provide systemic coverage. That being said, the risk of systemic failure is relatively low with her type of breast cancer.  She is leaning towards pursuing radiotherapy which I think is a good idea given her relatively good health and likely longevity.  We discussed the risks benefits and side effects of radiotherapy. She understands that the side effects would likely include some skin irritation and fatigue during the weeks of radiation. There is a risk of late effects which include but are not necessarily limited to cosmetic changes and rare lung toxicity. I would anticipate delivering approximately 3-4 weeks of radiotherapy.    If chemotherapy were to be given, this would precede radiotherapy.   I look forward to participating in the  patient's care.  I will await her referral back to me for postoperative follow-up and eventual CT simulation/treatment planning.  This encounter was provided by telemedicine platform by telephone as patient was unable to access MyChart video during pandemic precautions The patient has given verbal consent for this type of encounter and has been advised to only accept a meeting of this type in a secure network environment. The time spent during this encounter was 30 minutes. The attendants for this meeting include Eppie Gibson  and Sonny Masters Skoczylas.  During the encounter, Eppie Gibson was located at [facility]cology Department.  Sonny Masters Fidel was located at home.    __________________________________________   Eppie Gibson, MD  This document serves as a record of services personally performed by Eppie Gibson, MD. It was created on her behalf by Wilburn Mylar, a trained medical scribe. The creation of this record is based on the scribe's personal observations and the provider's statements to them. This document has been checked and approved by the attending provider.

## 2019-09-29 ENCOUNTER — Encounter (HOSPITAL_BASED_OUTPATIENT_CLINIC_OR_DEPARTMENT_OTHER): Payer: Self-pay | Admitting: *Deleted

## 2019-09-29 ENCOUNTER — Other Ambulatory Visit: Payer: Self-pay

## 2019-10-03 ENCOUNTER — Other Ambulatory Visit (HOSPITAL_COMMUNITY)
Admission: RE | Admit: 2019-10-03 | Discharge: 2019-10-03 | Disposition: A | Discharge status: Home / Self Care | Payer: Medicare Other | Source: Ambulatory Visit | Attending: General Surgery | Admitting: General Surgery

## 2019-10-03 DIAGNOSIS — Z20828 Contact with and (suspected) exposure to other viral communicable diseases: Secondary | ICD-10-CM | POA: Diagnosis not present

## 2019-10-03 DIAGNOSIS — Z01812 Encounter for preprocedural laboratory examination: Secondary | ICD-10-CM | POA: Diagnosis not present

## 2019-10-03 NOTE — Progress Notes (Addendum)

## 2019-10-04 LAB — NOVEL CORONAVIRUS, NAA (HOSP ORDER, SEND-OUT TO REF LAB; TAT 18-24 HRS): SARS-CoV-2, NAA: NOT DETECTED

## 2019-10-05 ENCOUNTER — Ambulatory Visit
Admission: RE | Admit: 2019-10-05 | Discharge: 2019-10-05 | Disposition: A | Discharge status: Home / Self Care | Payer: Medicare Other | Source: Ambulatory Visit | Attending: General Surgery | Admitting: General Surgery

## 2019-10-05 ENCOUNTER — Other Ambulatory Visit: Payer: Self-pay

## 2019-10-05 DIAGNOSIS — D242 Benign neoplasm of left breast: Secondary | ICD-10-CM

## 2019-10-05 DIAGNOSIS — C50412 Malignant neoplasm of upper-outer quadrant of left female breast: Secondary | ICD-10-CM | POA: Diagnosis not present

## 2019-10-05 DIAGNOSIS — N6021 Fibroadenosis of right breast: Secondary | ICD-10-CM

## 2019-10-05 DIAGNOSIS — Z170 Estrogen receptor positive status [ER+]: Secondary | ICD-10-CM

## 2019-10-05 DIAGNOSIS — R928 Other abnormal and inconclusive findings on diagnostic imaging of breast: Secondary | ICD-10-CM | POA: Diagnosis not present

## 2019-10-06 ENCOUNTER — Ambulatory Visit (HOSPITAL_BASED_OUTPATIENT_CLINIC_OR_DEPARTMENT_OTHER): Payer: Medicare Other | Admitting: Anesthesiology

## 2019-10-06 ENCOUNTER — Ambulatory Visit
Admission: RE | Admit: 2019-10-06 | Discharge: 2019-10-06 | Disposition: A | Discharge status: Home / Self Care | Payer: Medicare Other | Source: Ambulatory Visit | Attending: General Surgery | Admitting: General Surgery

## 2019-10-06 ENCOUNTER — Encounter (HOSPITAL_BASED_OUTPATIENT_CLINIC_OR_DEPARTMENT_OTHER): Payer: Self-pay

## 2019-10-06 ENCOUNTER — Encounter (HOSPITAL_BASED_OUTPATIENT_CLINIC_OR_DEPARTMENT_OTHER)
Admission: RE | Disposition: A | Discharge status: Home / Self Care | Payer: Self-pay | Source: Home / Self Care | Attending: General Surgery

## 2019-10-06 ENCOUNTER — Ambulatory Visit (HOSPITAL_BASED_OUTPATIENT_CLINIC_OR_DEPARTMENT_OTHER)
Admission: RE | Admit: 2019-10-06 | Discharge: 2019-10-06 | Disposition: A | Discharge status: Home / Self Care | Payer: Medicare Other | Attending: General Surgery | Admitting: General Surgery

## 2019-10-06 ENCOUNTER — Ambulatory Visit (HOSPITAL_COMMUNITY)
Admission: RE | Admit: 2019-10-06 | Discharge: 2019-10-06 | Disposition: A | Discharge status: Home / Self Care | Payer: Medicare Other | Source: Ambulatory Visit | Attending: General Surgery | Admitting: General Surgery

## 2019-10-06 ENCOUNTER — Other Ambulatory Visit: Payer: Self-pay

## 2019-10-06 DIAGNOSIS — N6021 Fibroadenosis of right breast: Secondary | ICD-10-CM

## 2019-10-06 DIAGNOSIS — C50912 Malignant neoplasm of unspecified site of left female breast: Secondary | ICD-10-CM | POA: Diagnosis not present

## 2019-10-06 DIAGNOSIS — Z803 Family history of malignant neoplasm of breast: Secondary | ICD-10-CM | POA: Insufficient documentation

## 2019-10-06 DIAGNOSIS — E785 Hyperlipidemia, unspecified: Secondary | ICD-10-CM | POA: Diagnosis not present

## 2019-10-06 DIAGNOSIS — Z87891 Personal history of nicotine dependence: Secondary | ICD-10-CM | POA: Insufficient documentation

## 2019-10-06 DIAGNOSIS — N6489 Other specified disorders of breast: Secondary | ICD-10-CM | POA: Diagnosis not present

## 2019-10-06 DIAGNOSIS — N6012 Diffuse cystic mastopathy of left breast: Secondary | ICD-10-CM | POA: Diagnosis not present

## 2019-10-06 DIAGNOSIS — C50412 Malignant neoplasm of upper-outer quadrant of left female breast: Secondary | ICD-10-CM | POA: Diagnosis not present

## 2019-10-06 DIAGNOSIS — K219 Gastro-esophageal reflux disease without esophagitis: Secondary | ICD-10-CM | POA: Diagnosis not present

## 2019-10-06 DIAGNOSIS — D242 Benign neoplasm of left breast: Secondary | ICD-10-CM | POA: Diagnosis not present

## 2019-10-06 DIAGNOSIS — Z170 Estrogen receptor positive status [ER+]: Secondary | ICD-10-CM

## 2019-10-06 DIAGNOSIS — N6011 Diffuse cystic mastopathy of right breast: Secondary | ICD-10-CM | POA: Diagnosis not present

## 2019-10-06 DIAGNOSIS — R928 Other abnormal and inconclusive findings on diagnostic imaging of breast: Secondary | ICD-10-CM | POA: Diagnosis not present

## 2019-10-06 DIAGNOSIS — C50112 Malignant neoplasm of central portion of left female breast: Secondary | ICD-10-CM | POA: Diagnosis not present

## 2019-10-06 DIAGNOSIS — Z79899 Other long term (current) drug therapy: Secondary | ICD-10-CM | POA: Insufficient documentation

## 2019-10-06 DIAGNOSIS — D241 Benign neoplasm of right breast: Secondary | ICD-10-CM | POA: Diagnosis not present

## 2019-10-06 DIAGNOSIS — F419 Anxiety disorder, unspecified: Secondary | ICD-10-CM | POA: Diagnosis not present

## 2019-10-06 DIAGNOSIS — C773 Secondary and unspecified malignant neoplasm of axilla and upper limb lymph nodes: Secondary | ICD-10-CM | POA: Diagnosis not present

## 2019-10-06 DIAGNOSIS — G8918 Other acute postprocedural pain: Secondary | ICD-10-CM | POA: Diagnosis not present

## 2019-10-06 HISTORY — PX: BREAST LUMPECTOMY WITH RADIOACTIVE SEED LOCALIZATION: SHX6424

## 2019-10-06 HISTORY — PX: BREAST LUMPECTOMY WITH RADIOACTIVE SEED AND SENTINEL LYMPH NODE BIOPSY: SHX6550

## 2019-10-06 SURGERY — BREAST LUMPECTOMY WITH RADIOACTIVE SEED AND SENTINEL LYMPH NODE BIOPSY
Anesthesia: Regional | Site: Breast | Laterality: Right

## 2019-10-06 MED ORDER — FENTANYL CITRATE (PF) 100 MCG/2ML IJ SOLN
INTRAMUSCULAR | Status: AC
  Filled 2019-10-06: qty 2

## 2019-10-06 MED ORDER — DEXAMETHASONE SODIUM PHOSPHATE 10 MG/ML IJ SOLN
INTRAMUSCULAR | Status: DC | PRN
  Administered 2019-10-06: 5 mg

## 2019-10-06 MED ORDER — FENTANYL CITRATE (PF) 100 MCG/2ML IJ SOLN
50.0000 ug | INTRAMUSCULAR | Status: AC | PRN
  Administered 2019-10-06 (×2): 50 ug via INTRAVENOUS
  Administered 2019-10-06: 25 ug via INTRAVENOUS
  Administered 2019-10-06: 50 ug via INTRAVENOUS

## 2019-10-06 MED ORDER — ACETAMINOPHEN 500 MG PO TABS
ORAL_TABLET | ORAL | Status: AC
  Filled 2019-10-06: qty 2

## 2019-10-06 MED ORDER — PHENYLEPHRINE 40 MCG/ML (10ML) SYRINGE FOR IV PUSH (FOR BLOOD PRESSURE SUPPORT)
PREFILLED_SYRINGE | INTRAVENOUS | Status: DC | PRN
  Administered 2019-10-06 (×3): 40 ug via INTRAVENOUS
  Administered 2019-10-06: 80 ug via INTRAVENOUS
  Administered 2019-10-06 (×2): 120 ug via INTRAVENOUS
  Administered 2019-10-06: 40 ug via INTRAVENOUS

## 2019-10-06 MED ORDER — MIDAZOLAM HCL 2 MG/2ML IJ SOLN
1.0000 mg | INTRAMUSCULAR | Status: DC | PRN
  Administered 2019-10-06: 2 mg via INTRAVENOUS

## 2019-10-06 MED ORDER — TECHNETIUM TC 99M SULFUR COLLOID FILTERED
1.0000 | Freq: Once | INTRAVENOUS | Status: AC | PRN
  Administered 2019-10-06: 15:00:00 1 via INTRADERMAL

## 2019-10-06 MED ORDER — CEFAZOLIN SODIUM-DEXTROSE 2-4 GM/100ML-% IV SOLN
2.0000 g | INTRAVENOUS | Status: AC
  Administered 2019-10-06: 2 g via INTRAVENOUS

## 2019-10-06 MED ORDER — DEXAMETHASONE SODIUM PHOSPHATE 4 MG/ML IJ SOLN
INTRAMUSCULAR | Status: DC | PRN
  Administered 2019-10-06: 5 mg via INTRAVENOUS

## 2019-10-06 MED ORDER — GABAPENTIN 300 MG PO CAPS
ORAL_CAPSULE | ORAL | Status: AC
  Filled 2019-10-06: qty 1

## 2019-10-06 MED ORDER — GABAPENTIN 300 MG PO CAPS
300.0000 mg | ORAL_CAPSULE | ORAL | Status: AC
  Administered 2019-10-06: 300 mg via ORAL

## 2019-10-06 MED ORDER — TRAMADOL HCL 50 MG PO TABS
ORAL_TABLET | ORAL | Status: AC
  Filled 2019-10-06: qty 1

## 2019-10-06 MED ORDER — TRAMADOL HCL 50 MG PO TABS
50.0000 mg | ORAL_TABLET | Freq: Once | ORAL | Status: AC | PRN
  Administered 2019-10-06: 50 mg via ORAL

## 2019-10-06 MED ORDER — CHLORHEXIDINE GLUCONATE CLOTH 2 % EX PADS: 6.0000 | MEDICATED_PAD | Freq: Once | CUTANEOUS | Status: DC

## 2019-10-06 MED ORDER — ONDANSETRON HCL 4 MG/2ML IJ SOLN
INTRAMUSCULAR | Status: DC | PRN
  Administered 2019-10-06: 4 mg via INTRAVENOUS

## 2019-10-06 MED ORDER — TRAMADOL HCL 50 MG PO TABS: 50.0000 mg | ORAL_TABLET | Freq: Four times a day (QID) | ORAL | 0 refills | Status: DC | PRN

## 2019-10-06 MED ORDER — MIDAZOLAM HCL 2 MG/2ML IJ SOLN
INTRAMUSCULAR | Status: AC
  Filled 2019-10-06: qty 2

## 2019-10-06 MED ORDER — ACETAMINOPHEN 500 MG PO TABS
1000.0000 mg | ORAL_TABLET | ORAL | Status: AC
  Administered 2019-10-06: 1000 mg via ORAL

## 2019-10-06 MED ORDER — PROPOFOL 10 MG/ML IV BOLUS
INTRAVENOUS | Status: AC
  Filled 2019-10-06: qty 20

## 2019-10-06 MED ORDER — LIDOCAINE 2% (20 MG/ML) 5 ML SYRINGE
INTRAMUSCULAR | Status: DC | PRN
  Administered 2019-10-06: 60 mg via INTRAVENOUS

## 2019-10-06 MED ORDER — LACTATED RINGERS IV SOLN
INTRAVENOUS | Status: DC
  Administered 2019-10-06 (×2): via INTRAVENOUS

## 2019-10-06 MED ORDER — EPHEDRINE SULFATE 50 MG/ML IJ SOLN
INTRAMUSCULAR | Status: DC | PRN
  Administered 2019-10-06: 10 mg via INTRAVENOUS

## 2019-10-06 MED ORDER — CEFAZOLIN SODIUM-DEXTROSE 2-4 GM/100ML-% IV SOLN
INTRAVENOUS | Status: AC
  Filled 2019-10-06: qty 100

## 2019-10-06 MED ORDER — PROPOFOL 10 MG/ML IV BOLUS
INTRAVENOUS | Status: DC | PRN
  Administered 2019-10-06: 150 mg via INTRAVENOUS

## 2019-10-06 MED ORDER — BUPIVACAINE HCL (PF) 0.25 % IJ SOLN
INTRAMUSCULAR | Status: DC | PRN
  Administered 2019-10-06: 25 mL

## 2019-10-06 MED ORDER — FENTANYL CITRATE (PF) 100 MCG/2ML IJ SOLN
25.0000 ug | INTRAMUSCULAR | Status: DC | PRN
  Administered 2019-10-06: 25 ug via INTRAVENOUS

## 2019-10-06 MED ORDER — ROPIVACAINE HCL 5 MG/ML IJ SOLN
INTRAMUSCULAR | Status: DC | PRN
  Administered 2019-10-06: 30 mL via PERINEURAL

## 2019-10-06 MED ORDER — PROPOFOL 500 MG/50ML IV EMUL
INTRAVENOUS | Status: DC | PRN
  Administered 2019-10-06: 25 ug/kg/min via INTRAVENOUS

## 2019-10-06 SURGICAL SUPPLY — 46 items
ADH SKN CLS APL DERMABOND .7 (GAUZE/BANDAGES/DRESSINGS) ×2
APL PRP STRL LF DISP 70% ISPRP (MISCELLANEOUS) ×2
APPLIER CLIP 9.375 MED OPEN (MISCELLANEOUS) ×3
APR CLP MED 9.3 20 MLT OPN (MISCELLANEOUS) ×2
BINDER BREAST XXLRG (GAUZE/BANDAGES/DRESSINGS) ×1 IMPLANT
BLADE SURG 15 STRL LF DISP TIS (BLADE) ×2 IMPLANT
BLADE SURG 15 STRL SS (BLADE) ×3
CANISTER SUC SOCK COL 7IN (MISCELLANEOUS) ×3 IMPLANT
CANISTER SUCT 1200ML W/VALVE (MISCELLANEOUS) ×3 IMPLANT
CHLORAPREP W/TINT 26 (MISCELLANEOUS) ×3 IMPLANT
CLIP APPLIE 9.375 MED OPEN (MISCELLANEOUS) ×2 IMPLANT
COVER BACK TABLE REUSABLE LG (DRAPES) ×3 IMPLANT
COVER MAYO STAND REUSABLE (DRAPES) ×3 IMPLANT
COVER PROBE W GEL 5X96 (DRAPES) ×3 IMPLANT
COVER WAND RF STERILE (DRAPES) IMPLANT
DECANTER SPIKE VIAL GLASS SM (MISCELLANEOUS) IMPLANT
DERMABOND ADVANCED (GAUZE/BANDAGES/DRESSINGS) ×1
DERMABOND ADVANCED .7 DNX12 (GAUZE/BANDAGES/DRESSINGS) ×2 IMPLANT
DRAPE LAPAROSCOPIC ABDOMINAL (DRAPES) ×3 IMPLANT
DRAPE UTILITY XL STRL (DRAPES) ×3 IMPLANT
ELECT COATED BLADE 2.86 ST (ELECTRODE) ×3 IMPLANT
ELECT REM PT RETURN 9FT ADLT (ELECTROSURGICAL) ×3
ELECTRODE REM PT RTRN 9FT ADLT (ELECTROSURGICAL) ×2 IMPLANT
GLOVE BIO SURGEON STRL SZ7.5 (GLOVE) ×6 IMPLANT
GOWN STRL REUS W/ TWL LRG LVL3 (GOWN DISPOSABLE) ×4 IMPLANT
GOWN STRL REUS W/TWL LRG LVL3 (GOWN DISPOSABLE) ×6
ILLUMINATOR WAVEGUIDE N/F (MISCELLANEOUS) IMPLANT
KIT MARKER MARGIN INK (KITS) ×3 IMPLANT
LIGHT WAVEGUIDE WIDE FLAT (MISCELLANEOUS) IMPLANT
NDL HYPO 25X1 1.5 SAFETY (NEEDLE) ×2 IMPLANT
NDL SAFETY ECLIPSE 18X1.5 (NEEDLE) IMPLANT
NEEDLE HYPO 18GX1.5 SHARP (NEEDLE)
NEEDLE HYPO 25X1 1.5 SAFETY (NEEDLE) ×3 IMPLANT
NS IRRIG 1000ML POUR BTL (IV SOLUTION) IMPLANT
PACK BASIN DAY SURGERY FS (CUSTOM PROCEDURE TRAY) ×3 IMPLANT
PENCIL SMOKE EVACUATOR (MISCELLANEOUS) ×3 IMPLANT
SLEEVE SCD COMPRESS KNEE MED (MISCELLANEOUS) ×3 IMPLANT
SPONGE LAP 18X18 RF (DISPOSABLE) ×3 IMPLANT
SUT MON AB 4-0 PC3 18 (SUTURE) ×7 IMPLANT
SUT SILK 2 0 SH (SUTURE) IMPLANT
SUT VICRYL 3-0 CR8 SH (SUTURE) ×4 IMPLANT
SYR CONTROL 10ML LL (SYRINGE) ×3 IMPLANT
TOWEL GREEN STERILE FF (TOWEL DISPOSABLE) ×3 IMPLANT
TRAY FAXITRON CT DISP (TRAY / TRAY PROCEDURE) ×6 IMPLANT
TUBE CONNECTING 20X1/4 (TUBING) ×3 IMPLANT
YANKAUER SUCT BULB TIP NO VENT (SUCTIONS) IMPLANT

## 2019-10-06 NOTE — Interval H&P Note (Signed)
History and Physical Interval Note:  10/06/2019 1:49 PM  Shelly [name]  has presented today for surgery, with the diagnosis of left breast cancer and papilloma right breast complex sclerosing lesion.  The various methods of treatment have been discussed with the patient and family. After consideration of risks, benefits and other options for treatment, the patient has consented to  Procedure(s): LEFT BREAST LUMPECTOMY  X2 WITH RADIOACTIVE SEED X2  AND SENTINEL LYMPH NODE BIOPSY (Left) RIGHT BREAST LUMPECTOMY WITH RADIOACTIVE SEED LOCALIZATION (Right) as a surgical intervention.  The patient's history has been reviewed, patient examined, no change in status, stable for surgery.  I have reviewed the patient's chart and labs.  Questions were answered to the patient's satisfaction.     Autumn Messing III

## 2019-10-06 NOTE — Progress Notes (Signed)
   Assisted Dr. Woodrum with left, ultrasound guided, pectoralis block. Side rails up, monitors on throughout procedure. See vital signs in flow sheet. Tolerated Procedure well. 

## 2019-10-06 NOTE — Anesthesia Procedure Notes (Signed)
Procedure Name: LMA Insertion Date/Time: 10/06/2019 2:38 PM Performed by: Kyaira Trantham, Ernesta Amble, CRNA Pre-anesthesia Checklist: Patient identified, Emergency Drugs available, Suction available and Patient being monitored Patient Re-evaluated:Patient Re-evaluated prior to induction Oxygen Delivery Method: Circle system utilized Preoxygenation: Pre-oxygenation with 100% oxygen Induction Type: IV induction Ventilation: Mask ventilation without difficulty LMA: LMA inserted LMA Size: 4.0 Number of attempts: 1 Airway Equipment and Method: Bite block Placement Confirmation: positive ETCO2 Tube secured with: Tape Dental Injury: Teeth and Oropharynx as per pre-operative assessment

## 2019-10-06 NOTE — Anesthesia Preprocedure Evaluation (Addendum)
Anesthesia Evaluation  Patient identified by MRN, date of birth, ID band Patient awake    Reviewed: Allergy & Precautions, NPO status , Patient's Chart, lab work & pertinent test results  Airway Mallampati: II  TM Distance: >3 FB Neck ROM: Full    Dental no notable dental hx. (+) Teeth Intact, Dental Advisory Given   Pulmonary neg pulmonary ROS, former smoker,    Pulmonary exam normal breath sounds clear to auscultation       Cardiovascular Normal cardiovascular exam Rhythm:Regular Rate:Normal  HLD   Neuro/Psych PSYCHIATRIC DISORDERS Anxiety negative neurological ROS     GI/Hepatic Neg liver ROS, GERD  Medicated and Controlled,  Endo/Other  negative endocrine ROS  Renal/GU negative Renal ROS  negative genitourinary   Musculoskeletal  (+) Arthritis ,   Abdominal   Peds  Hematology negative hematology ROS (+)   Anesthesia Other Findings Left breast cancer  Reproductive/Obstetrics                            Anesthesia Physical Anesthesia Plan  ASA: II  Anesthesia Plan: General and Regional   Post-op Pain Management:  Regional for Post-op pain   Induction: Intravenous  PONV Risk Score and Plan: 3 and Ondansetron, Dexamethasone and Midazolam  Airway Management Planned: LMA  Additional Equipment:   Intra-op Plan:   Post-operative Plan: Extubation in OR  Informed Consent: I have reviewed the patients History and Physical, chart, labs and discussed the procedure including the risks, benefits and alternatives for the proposed anesthesia with the patient or authorized representative who has indicated his/her understanding and acceptance.     Dental advisory given  Plan Discussed with: CRNA  Anesthesia Plan Comments:         Anesthesia Quick Evaluation

## 2019-10-06 NOTE — H&P (Signed)
Shelly [name]  Location: [facility] Patient #: [id number] DOB: [birth year] Married / Language: English / Race: White Female   History of Present Illness  The patient is a 73 year old female who presents with breast cancer. We're asked to see the patient in consultation by Dr. Eduard Clos to evaluate her for a new left breast cancer. The patient is a 73 year old white female who recently went for a routine screening mammogram. At that time she was found to have a 1 cm abnormality in the upper outer subareolar left breast with normal-looking lymph nodes. This was biopsied and came back as an invasive breast cancer that was ER/PR positive and HER-2 negative with a Ki-67 of 12%. She also had a small area of distortion near this in the 3 o'clock position of the left breast that came back as a papilloma and a third area in the right breast that was biopsied and came back as a complex sclerosing lesion. She does have a significant family history of breast cancer in 2 aunts and a maternal cousin. She quit smoking about 8 years ago.   Past Surgical History  Cataract Surgery  Bilateral. Colon Polyp Removal - Colonoscopy  Tonsillectomy   Diagnostic Studies History  Colonoscopy  1-5 years ago Mammogram  within last year Pap Smear  >5 years ago  Allergies  Sulfa Antibiotics  Allergies Reconciled   Medication History  LORazepam (0.5MG Tablet, Oral) Active. Librax (5-2.5MG Capsule, Oral) Active. Medications Reconciled  Social History  Alcohol use  Moderate alcohol use. Caffeine use  Coffee. No drug use  Tobacco use  Former smoker.  Family History Arthritis  Mother. Breast Cancer  Family Members In General. Cancer  Father, Mother. Cervical Cancer  Family Members In General. Colon Polyps  Mother. Diabetes Mellitus  Mother. Hypertension  Father, Mother. Ischemic Bowel Disease  Mother. Respiratory Condition  Father. Thyroid problems   Mother.  Pregnancy / Birth History  Age at menarche  26 years. Age of menopause  <43 Maternal age  85-25 Para  2  Other Problems  Anxiety Disorder  Diverticulosis  Gastric Ulcer  Gastroesophageal Reflux Disease  Hypercholesterolemia     Review of Systems  General Not Present- Appetite Loss, Chills, Fatigue, Fever, Night Sweats, Weight Gain and Weight Loss. Skin Present- Rash. Not Present- Change in Wart/Mole, Dryness, Hives, Jaundice, New Lesions, Non-Healing Wounds and Ulcer. HEENT Present- Seasonal Allergies. Not Present- Earache, Hearing Loss, Hoarseness, Nose Bleed, Oral Ulcers, Ringing in the Ears, Sinus Pain, Sore Throat, Visual Disturbances, Wears glasses/contact lenses and Yellow Eyes. Breast Present- Skin Changes. Not Present- Breast Mass, Breast Pain and Nipple Discharge. Cardiovascular Not Present- Chest Pain, Difficulty Breathing Lying Down, Leg Cramps, Palpitations, Rapid Heart Rate, Shortness of Breath and Swelling of Extremities. Gastrointestinal Present- Excessive gas. Not Present- Abdominal Pain, Bloating, Bloody Stool, Change in Bowel Habits, Chronic diarrhea, Constipation, Difficulty Swallowing, Gets full quickly at meals, Hemorrhoids, Indigestion, Nausea, Rectal Pain and Vomiting. Female Genitourinary Present- Frequency. Not Present- Nocturia, Painful Urination, Pelvic Pain and Urgency. Musculoskeletal Present- Joint Stiffness. Not Present- Back Pain, Joint Pain, Muscle Pain, Muscle Weakness and Swelling of Extremities. Neurological Not Present- Decreased Memory, Fainting, Headaches, Numbness, Seizures, Tingling, Tremor, Trouble walking and Weakness. Psychiatric Present- Anxiety and Fearful. Not Present- Bipolar, Change in Sleep Pattern, Depression and Frequent crying. Endocrine Not Present- Cold Intolerance, Excessive Hunger, Hair Changes, Heat Intolerance, Hot flashes and New Diabetes. Hematology Not Present- Blood Thinners, Easy Bruising, Excessive  bleeding, Gland problems, HIV and Persistent Infections.  Vitals  Weight: 180.8 lb Height: 65in Body Surface Area: 1.9 m Body Mass Index: 30.09 kg/m  Temp.: 97.73F  Pulse: 79 (Regular)  BP: 128/74 (Sitting, Left Arm, Standard)       Physical Exam  General Mental Status-Alert. General Appearance-Consistent with stated age. Hydration-Well hydrated. Voice-Normal.  Head and Neck Head-normocephalic, atraumatic with no lesions or palpable masses. Trachea-midline. Thyroid Gland Characteristics - normal size and consistency.  Eye Eyeball - Bilateral-Extraocular movements intact. Sclera/Conjunctiva - Bilateral-No scleral icterus.  Chest and Lung Exam Chest and lung exam reveals -quiet, even and easy respiratory effort with no use of accessory muscles and on auscultation, normal breath sounds, no adventitious sounds and normal vocal resonance. Inspection Chest Wall - Normal. Back - normal.  Breast Note: There is a palpable bruise in the outer aspect of the left breast centrally. Other than this there is no other palpable mass in either breast. There is no palpable axillary, supraclavicular, or cervical lymphadenopathy. She also does have a rash on both breasts which may be a contact dermatitis related to the soap that was used to clean the skin   Cardiovascular Cardiovascular examination reveals -normal heart sounds, regular rate and rhythm with no murmurs and normal pedal pulses bilaterally.  Abdomen Inspection Inspection of the abdomen reveals - No Hernias. Skin - Scar - no surgical scars. Palpation/Percussion Palpation and Percussion of the abdomen reveal - Soft, Non Tender, No Rebound tenderness, No Rigidity (guarding) and No hepatosplenomegaly. Auscultation Auscultation of the abdomen reveals - Bowel sounds normal.  Neurologic Neurologic evaluation reveals -alert and oriented x 3 with no impairment of recent or remote memory. Mental  Status-Normal.  Musculoskeletal Normal Exam - Left-Upper Extremity Strength Normal and Lower Extremity Strength Normal. Normal Exam - Right-Upper Extremity Strength Normal and Lower Extremity Strength Normal.  Lymphatic Head & Neck  General Head & Neck Lymphatics: Bilateral - Description - Normal. Axillary  General Axillary Region: Bilateral - Description - Normal. Tenderness - Non Tender. Femoral & Inguinal  Generalized Femoral & Inguinal Lymphatics: Bilateral - Description - Normal. Tenderness - Non Tender.    Assessment & Plan  SCLEROSING ADENOSIS OF BREAST, RIGHT (N60.21) MALIGNANT NEOPLASM OF UPPER-OUTER QUADRANT OF LEFT BREAST IN FEMALE, ESTROGEN RECEPTOR POSITIVE (C50.412) Impression: The patient appears to have a small 1 cm cancer in the upper outer quadrant of the left breast with an adjacent papilloma. I have discussed with her in detail the options for treatment of this cancer and at this point she favors breast conservation. I think this is a very reasonable way of treating her cancer. I have discussed with her in detail the risks and benefits of the operation as well as some of the technical aspects including the use of radioactive seed for localization and she understands and wishes to proceed. She will also be a good candidate for sentinel node biopsy. She was also found to have a complex sclerosing lesion in the right breast and with her history of current cancer in the left breast at think it would be reasonable to remove that area as well. This would also require a radioactive seed for localization. I will go ahead and refer her to medical and radiation oncology as well to talk about adjuvant therapy. Current Plans Referred to Oncology, for evaluation and follow up (Oncology). Routine.

## 2019-10-06 NOTE — Anesthesia Procedure Notes (Signed)
Anesthesia Regional Block: Pectoralis block   Pre-Anesthetic Checklist: ,, timeout performed, Correct Patient, Correct Site, Correct Laterality, Correct Procedure, Correct Position, site marked, Risks and benefits discussed,  Surgical consent,  Pre-op evaluation,  At surgeon's request and post-op pain management  Laterality: Left  Prep: Maximum Sterile Barrier Precautions used, chloraprep       Needles:  Injection technique: Single-shot  Needle Type: Echogenic Stimulator Needle     Needle Length: 9cm  Needle Gauge: 22     Additional Needles:   Procedures:,,,, ultrasound used (permanent image in chart),,,,  Narrative:  Start time: 10/06/2019 2:07 PM End time: 10/06/2019 2:17 PM Injection made incrementally with aspirations every 5 mL.  Performed by: Personally  Anesthesiologist: Freddrick March, MD  Additional Notes: Monitors applied. No increased pain on injection. No increased resistance to injection. Injection made in 5cc increments. Good needle visualization. Patient tolerated procedure well.

## 2019-10-06 NOTE — Transfer of Care (Signed)
Immediate Anesthesia Transfer of Care Note  Patient: Shelly [name]  Procedure(s) Performed: LEFT BREAST LUMPECTOMY  X2 WITH RADIOACTIVE SEED X2  AND SENTINEL LYMPH NODE BIOPSY (Left Breast) RIGHT BREAST LUMPECTOMY WITH RADIOACTIVE SEED LOCALIZATION (Right Breast)  Patient Location: PACU  Anesthesia Type:General  Level of Consciousness: sedated  Airway & Oxygen Therapy: Patient Spontanous Breathing and Patient connected to face mask oxygen  Post-op Assessment: Report given to RN and Post -op Vital signs reviewed and stable  Post vital signs: Reviewed and stable  Last Vitals:  Vitals Value Taken Time  BP 138/60 10/06/19 1633  Temp    Pulse 78 10/06/19 1637  Resp 15 10/06/19 1637  SpO2 97 % 10/06/19 1637  Vitals shown include unvalidated device data.  Last Pain:  Vitals:   10/06/19 1220  TempSrc: Oral  PainSc: 0-No pain      Patients Stated Pain Goal: 8 ([id number])  Complications: No apparent anesthesia complications

## 2019-10-06 NOTE — Op Note (Signed)
10/06/2019  4:43 PM  PATIENT:  Shelly [name]  73 y.o. female  PRE-OPERATIVE DIAGNOSIS:  left breast cancer and papilloma right breast complex sclerosing lesion  POST-OPERATIVE DIAGNOSIS:  left breast cancer and papilloma right breast complex sclerosing lesion  PROCEDURE:  Procedure(s): LEFT BREAST LUMPECTOMY  X2 WITH RADIOACTIVE SEED X2  AND DEEP LEFT AXILLARY SENTINEL LYMPH NODE BIOPSY (Left) RIGHT BREAST LUMPECTOMY WITH RADIOACTIVE SEED LOCALIZATION (Right)  SURGEON:  Surgeon(s) and Role:    Jovita Kussmaul, MD - Primary  PHYSICIAN ASSISTANT:   ASSISTANTS: none   ANESTHESIA:   local and general  EBL:  10 mL   BLOOD ADMINISTERED:none  DRAINS: none   LOCAL MEDICATIONS USED:  MARCAINE     SPECIMEN:  Source of Specimen:  left breast tissue and sentinel node x 3, right breast tissue  DISPOSITION OF SPECIMEN:  PATHOLOGY  COUNTS:  YES  TOURNIQUET:  * No tourniquets in log *  DICTATION: .Dragon Dictation   After informed consent was obtained the patient was brought to the operating room and placed in the supine position on the operating table.  After adequate induction of general anesthesia the patient's bilateral chest, breast, and axillary areas were prepped with ChloraPrep, allowed to dry, and draped in usual sterile manner.  An appropriate timeout was performed.  Earlier in the day the patient underwent injection 1 mCi of technetium sulfur colloid in the subareolar position on the left.  Previously 1 I-125 seed was placed in the right breast centrally to mark an area of complex sclerosing lesion.  2 seeds were also placed in the central left breast to mark an area of invasive breast cancer and a papilloma.  Attention was first turned to the right breast.  The neoprobe was set to I-125 in the area of radioactivity was readily identified.  The area around this was infiltrated with quarter percent Marcaine.  A curvilinear incision was made along the upper edge of the areola  with a 15 blade knife.  The incision was carried through the skin and subcutaneous tissue sharply with the electrocautery.  Dissection was then carried towards the radioactive seed under the direction of the neoprobe.  Once I more closely approached the radioactive seed I then removed a circular portion of breast tissue sharply with the electrocautery around the radioactive seed while checking the area of radioactivity frequently.  Once the specimen was removed it was oriented with the appropriate paint colors.  A specimen radiograph was obtained that showed the clip and seed to be near the center of the specimen.  The specimen was then sent to pathology for further evaluation.  Hemostasis was achieved using the Bovie electrocautery.  The wound was irrigated with saline and infiltrated with more quarter percent Marcaine.  The deep layer of the wound was then closed with layers of interrupted 3-0 Vicryl stitches.  The skin was closed with interrupted 4-0 Monocryl subcuticular stitches.  Attention was then turned to the left breast.  The neoprobe was set to technetium and an area of radioactivity was readily identified in the left axilla.  This area was infiltrated with quarter percent Marcaine.  A small transversely oriented incision was made with a 15 blade knife.  The incision was carried through the skin and subcutaneous tissue sharply with the electrocautery until the deep left axillary space was entered.  The neoprobe was used to direct blunt hemostat dissection.  I was able to identify 3 hot lymph nodes.  Each of these was  excised sharply with the electrocautery and the surrounding lymphatics and small vessels were controlled with clips.  Ex vivo counts on these nodes were approximately 30 to 300.  These nodes were sent as sentinel nodes numbers 1 through 3.  No other hot or palpable lymph nodes were identified in the left axilla.  Hemostasis was achieved using the Bovie electrocautery.  The deep layer of the  wound was then closed with interrupted 3-0 Vicryl stitches.  The skin was closed with a running 4-0 Monocryl subcuticular stitch.  Attention was then turned to the left breast.  The neoprobe was set to I-125 in the area of radioactivity was readily identified centrally.  The area around this was infiltrated with quarter percent Marcaine.  A curvilinear incision was made along the upper outer edge of the areola of the left breast with a 15 blade knife.  The incision was carried through the skin and subcutaneous tissue sharply with the electrocautery.  The radioactivity and firmness from the cancer seem to be fairly shallow behind the nipple.  Dissection was carried in the shallow plane directly behind the areola and nipple until we were well beyond the area of the cancer.  A circular portion of breast tissue was then excised sharply with the electrocautery around the area of radioactivity and palpable firmness to encompass both the cancer and the papilloma as they were directly adjacent to each other.  Once the specimen was removed it was oriented with the appropriate paint colors.  A specimen radiograph was obtained that showed the seed and 2 clips near the center of the specimen.  The seed that I believe corresponded to the papilloma was encountered during the dissection and sent separately in a container.  The specimen was then sent to pathology for further evaluation.  At this point if the anterior margin is positive she will unfortunately have to lose the nipple and areola to try to obtain a clean margin.  Hemostasis was achieved using the Bovie electrocautery.  The wound was irrigated with saline and infiltrated with more quarter percent Marcaine.  The cavity was marked with clips.  The deep layer of the wound was then closed with layers of interrupted 3-0 Vicryl stitches.  The skin was then closed with interrupted 4-0 Monocryl subcuticular stitches.  Dermabond dressings were applied.  The patient tolerated the  procedure well.  At the end of the case all needle sponge and instrument counts were correct.  The patient was then awakened and taken to recovery in stable condition.  PLAN OF CARE: Discharge to home after PACU  PATIENT DISPOSITION:  PACU - hemodynamically stable.   Delay start of Pharmacological VTE agent (>24hrs) due to surgical blood loss or risk of bleeding: not applicable

## 2019-10-06 NOTE — Discharge Instructions (Signed)
Next dose of Tylenol can be taken at 630 pm today if needed   Post Anesthesia Home Care Instructions  Activity: Get plenty of rest for the remainder of the day. A responsible individual must stay with you for 24 hours following the procedure.  For the next 24 hours, DO NOT: -Drive a car -Paediatric nurse -Drink alcoholic beverages -Take any medication unless instructed by your physician -Make any legal decisions or sign important papers.  Meals: Start with liquid foods such as gelatin or soup. Progress to regular foods as tolerated. Avoid greasy, spicy, heavy foods. If nausea and/or vomiting occur, drink only clear liquids until the nausea and/or vomiting subsides. Call your physician if vomiting continues.  Special Instructions/Symptoms: Your throat may feel dry or sore from the anesthesia or the breathing tube placed in your throat during surgery. If this causes discomfort, gargle with warm salt water. The discomfort should disappear within 24 hours.  If you had a scopolamine patch placed behind your ear for the management of post- operative nausea and/or vomiting:  1. The medication in the patch is effective for 72 hours, after which it should be removed.  Wrap patch in a tissue and discard in the trash. Wash hands thoroughly with soap and water. 2. You may remove the patch earlier than 72 hours if you experience unpleasant side effects which may include dry mouth, dizziness or visual disturbances. 3. Avoid touching the patch. Wash your hands with soap and water after contact with the patch.      Regional Anesthesia Blocks  1. Numbness or the inability to move the "blocked" extremity may last from 3-48 hours after placement. The length of time depends on the medication injected and your individual response to the medication. If the numbness is not going away after 48 hours, call your surgeon.  2. The extremity that is blocked will need to be protected until the numbness is gone and  the  Strength has returned. Because you cannot feel it, you will need to take extra care to avoid injury. Because it may be weak, you may have difficulty moving it or using it. You may not know what position it is in without looking at it while the block is in effect.  3. For blocks in the legs and feet, returning to weight bearing and walking needs to be done carefully. You will need to wait until the numbness is entirely gone and the strength has returned. You should be able to move your leg and foot normally before you try and bear weight or walk. You will need someone to be with you when you first try to ensure you do not fall and possibly risk injury.  4. Bruising and tenderness at the needle site are common side effects and will resolve in a few days.  5. Persistent numbness or new problems with movement should be communicated to the surgeon or the [place] [phone] [place] [phone]).

## 2019-10-07 NOTE — Anesthesia Postprocedure Evaluation (Signed)
Anesthesia Post Note  Patient: Shelly [name]  Procedure(s) Performed: LEFT BREAST LUMPECTOMY  X2 WITH RADIOACTIVE SEED X2  AND SENTINEL LYMPH NODE BIOPSY (Left Breast) RIGHT BREAST LUMPECTOMY WITH RADIOACTIVE SEED LOCALIZATION (Right Breast)     Patient location during evaluation: PACU Anesthesia Type: Regional and General Level of consciousness: awake and alert Pain management: pain level controlled Vital Signs Assessment: post-procedure vital signs reviewed and stable Respiratory status: spontaneous breathing, nonlabored ventilation, respiratory function stable and patient connected to nasal cannula oxygen Cardiovascular status: blood pressure returned to baseline and stable Postop Assessment: no apparent nausea or vomiting Anesthetic complications: no    Last Vitals:  Vitals:   10/06/19 1756 10/06/19 1815  BP: (!) 164/87   Pulse: 88   Resp: 16   Temp: 36.6 C   SpO2: 96% 96%    Last Pain:  Vitals:   10/06/19 1756  TempSrc: Oral  PainSc: 6                  Jt Brabec L Eren Ryser

## 2019-10-09 ENCOUNTER — Encounter (HOSPITAL_BASED_OUTPATIENT_CLINIC_OR_DEPARTMENT_OTHER): Payer: Self-pay | Admitting: General Surgery

## 2019-10-09 LAB — SURGICAL PATHOLOGY

## 2019-10-10 ENCOUNTER — Telehealth: Payer: Self-pay | Admitting: *Deleted

## 2019-10-10 NOTE — Telephone Encounter (Signed)
Ordered mammaprint per Dr. Burr Medico.  Faxed requisition to pathology and [facility].

## 2019-10-17 ENCOUNTER — Telehealth: Payer: Self-pay | Admitting: *Deleted

## 2019-10-17 ENCOUNTER — Other Ambulatory Visit: Payer: Self-pay | Admitting: Family Medicine

## 2019-10-17 ENCOUNTER — Ambulatory Visit: Payer: Self-pay | Admitting: General Surgery

## 2019-10-17 NOTE — Telephone Encounter (Signed)
OK to continue this medication? 

## 2019-10-17 NOTE — Telephone Encounter (Signed)
Received mammaprint results of high risk.  Patient is aware.  Confirmed appointment for 12/3 at 1020 to discuss with dr. Burr Medico.

## 2019-10-18 NOTE — Progress Notes (Signed)
[place]   [phone] [phone]   Clinic Follow up Note   Patient Care Team: Eulas Post, MD as PCP - General (Family Medicine) Barbaraann Cao, [place] as Referring Physician (Optometry) Jovita Kussmaul, MD as Consulting Physician (General Surgery)  Date of Service:  10/19/2019  CHIEF COMPLAINT: F/u of left breast cancer  SUMMARY OF ONCOLOGIC HISTORY: Oncology History Overview Note  Cancer Staging Cancer of central portion of left female breast [facility]) Staging form: Breast, AJCC 8th Edition - Clinical stage from 09/18/2019: Stage IA (cT1b, cN0, cM0, G2, ER+, PR+, HER2-) - Signed by Truitt Merle, MD on 09/18/2019 - Pathologic stage from 10/06/2019: Stage IA (pT1c, pN1a, cM0, G2, ER+, PR+, HER2-) - Signed by Truitt Merle, MD on 10/19/2019    Cancer of central portion of left female breast ([place])  08/24/2019 Mammogram   Diagnostic mammogram 08/24/19  IMPRESSION: 1. Left breast mass measuring 0.8 x 0.4 x 0.5 cm at the 1 o'clock retroareolar region is suspicious and likely corresponds to the area of distortion seen on mammography.   2. Left breast 3 o'clock retroareolar dilated duct with internal debris versus solid mass.   3. Right breast retroareolar focal asymmetry without sonographic correlate.   08/28/2019 Initial Biopsy   Final Diagnosis 08/28/19 1. Breast, left, needle core biopsy, 1 o'clock/retroareolar - INVASIVE DUCTAL CARCINOMA, GRADE II. - PERINEURAL INVASION. - SEE MICROSCOPIC DESCRIPTION. 2. Breast, left, needle core biopsy, 3 o'clock retroareolar - DUCTAL PAPILLOMA. - SEE MICROSCOPIC DESCRIPTION.   08/28/2019 Receptors her2   1. PROGNOSTIC INDICATORS 08/28/19 Results: IMMUNOHISTOCHEMICAL AND MORPHOMETRIC ANALYSIS PERFORMED MANUALLY The tumor cells are NEGATIVE for Her2 (0). Estrogen Receptor: 100%, POSITIVE, STRONG STAINING INTENSITY Progesterone Receptor: 50%, POSITIVE, STRONG STAINING INTENSITY Proliferation Marker  Ki67: 12%   09/01/2019 Pathology Results   Diagnosis 09/01/19 Breast, right, needle core biopsy, retroareolar - COMPLEX SCLEROSING LESION WITH A PAPILLARY SCLEROSING LESION AND USUAL DUCTAL HYPERPLASIA. SEE NOTE - NEGATIVE FOR CARCINOMA   09/18/2019 Initial Diagnosis   Cancer of central portion of left female breast ([place])   09/18/2019 Cancer Staging   Staging form: Breast, AJCC 8th Edition - Clinical stage from 09/18/2019: Stage IA (cT1b, cN0, cM0, G2, ER+, PR+, HER2-) - Signed by Truitt Merle, MD on 09/18/2019   10/06/2019 Surgery   LEFT BREAST LUMPECTOMY  X2 WITH RADIOACTIVE SEED X2  AND SENTINEL LYMPH NODE BIOPSY and  RIGHT BREAST LUMPECTOMY WITH RADIOACTIVE SEED LOCALIZATION by Dr Marlou Starks  10/06/19    10/06/2019 Pathology Results   FINAL MICROSCOPIC DIAGNOSIS: 10/06/19   A. BREAST, RIGHT, LUMPECTOMY:  - Fibrocystic change with a small incidental intraductal papilloma and  usual ductal hyperplasia  - Biopsy site changes  - Negative for carcinoma   B. LYMPH NODE, LEFT #1, SENTINEL, BIOPSY:  - Metastatic carcinoma to a lymph node (1/1)  - Focus of metastatic carcinoma measures 2.5 mm and shows extranodal  extension   C. LYMPH NODE, LEFT #2, SENTINEL, BIOPSY:  - Lymph node, negative for carcinoma (0/1)   D. LYMPH NODE, LEFT #3, SENTINEL, BIOPSY:  - Lymph node, negative for carcinoma (0/1)   E. BREAST, LEFT, LUMPECTOMY:  - Invasive ductal carcinoma, grade 2, 1.8 cm  - Ductal carcinoma in situ, intermediate grade  - Invasive carcinoma focally involves the anterior margin  - DCIS is less than 1 mm from the superior margin  - Background breast parenchyma with fibrocystic change, usual ductal  hyperplasia and intraductal papilloma.  - Biopsy site changes  - See  oncology table    10/06/2019 Miscellaneous   Her Mammaprint showed High Risk Luminal Type B, risk of recurrence in the next 10 years is 29% in high risk disease. MPI -0.335    10/06/2019 Cancer Staging   Staging form:  Breast, AJCC 8th Edition - Pathologic stage from 10/06/2019: Stage IA (pT1c, pN1a, cM0, G2, ER+, PR+, HER2-) - Signed by Truitt Merle, MD on 10/19/2019      CURRENT THERAPY:  PENDING re-excision surgery and Adjuvant chemo TC q3weeks for 4 cycles   INTERVAL HISTORY:  [name] is here for a follow up after surgery. She presents to the clinic with her husband. She notes she is recovering well from surgery. She does have  amount of b/l breast pain. She doe snot sleep through the night. She has been taking extra strength tylenol around the clock. She was given tramadol but felt lightheaded and dizzy after 1 tablet. She would like to think about her chemo options more but is leaning towards TC treatment.     REVIEW OF SYSTEMS:   Constitutional: Denies fevers, chills or abnormal weight loss Eyes: Denies blurriness of vision Ears, nose, mouth, throat, and face: Denies mucositis or sore throat Respiratory: Denies cough, dyspnea or wheezes Cardiovascular: Denies palpitation, chest discomfort or lower extremity swelling Gastrointestinal:  Denies nausea, heartburn or change in bowel habits Skin: Denies abnormal skin rashes Lymphatics: Denies new lymphadenopathy or easy bruising Neurological:Denies numbness, tingling or new weaknesses Behavioral/Psych: Mood is stable, no new changes  Breast: (+) Significant b/l breat pain  All other systems were reviewed with the patient and are negative.  MEDICAL HISTORY:  Past Medical History:  Diagnosis Date  . Arthritis    wrist, shoulder   . Cancer ([place]) 08/2019   left breast IDC  . Diverticulitis   . Endometrial polyp   . GERD (gastroesophageal reflux disease)    occasional - diet controlled  . Hx of adenomatous colonic polyps   . Hyperlipidemia   . IBS (irritable bowel syndrome)   . Missed abortion    x 1 - resolved - no surgery  . Osteoporosis   . SVD (spontaneous vaginal delivery)    x 1  . Transient vision disturbance 2019   was  evaluated by ED, no findings  . Vitamin D deficiency     SURGICAL HISTORY: Past Surgical History:  Procedure Laterality Date  . BREAST LUMPECTOMY WITH RADIOACTIVE SEED AND SENTINEL LYMPH NODE BIOPSY Left 10/06/2019   Procedure: LEFT BREAST LUMPECTOMY  X2 WITH RADIOACTIVE SEED X2  AND SENTINEL LYMPH NODE BIOPSY;  Surgeon: Jovita Kussmaul, MD;  Location: [facility];  Service: General;  Laterality: Left;  . BREAST LUMPECTOMY WITH RADIOACTIVE SEED LOCALIZATION Right 10/06/2019   Procedure: RIGHT BREAST LUMPECTOMY WITH RADIOACTIVE SEED LOCALIZATION;  Surgeon: Jovita Kussmaul, MD;  Location: [facility];  Service: General;  Laterality: Right;  . COLONOSCOPY    . DIAGNOSTIC LAPAROSCOPY     cysts  . DILATATION & CURETTAGE/HYSTEROSCOPY WITH TRUECLEAR N/A 10/31/2013   Procedure: DILATATION & CURETTAGE/HYSTEROSCOPY WITH TRUCLEAR;  Surgeon: Marylynn Pearson, MD;  Location: [facility];  Service: Gynecology;  Laterality: N/A;  . DILATION AND CURETTAGE OF UTERUS    . TONSILLECTOMY  1952  . WISDOM TOOTH EXTRACTION      I have reviewed the social history and family history with the patient and they are unchanged from previous note.  ALLERGIES:  is allergic to crestor [rosuvastatin calcium]; lipitor [atorvastatin calcium]; nsaids; pravastatin;  and sulfonamide derivatives.  MEDICATIONS:  Current Outpatient Medications  Medication Sig Dispense Refill  . acetaminophen (TYLENOL) 500 MG tablet Take 500 mg by mouth every 6 (six) hours as needed for headache.    . bismuth subsalicylate (PEPTO BISMOL) 262 MG/15ML suspension Take 30 mLs by mouth every 6 (six) hours as needed.    . clidinium-chlordiazePOXIDE (LIBRAX) 5-2.5 MG capsule TAKE 1 CAPSULE BY MOUTH EVERY 8 (EIGHT) HOURS AS NEEDED (BOWELS). 50 capsule 1  . esomeprazole (NEXIUM) 20 MG capsule Take 20 mg by mouth daily as needed (acid reflux).     Marland Kitchen loratadine (CLARITIN) 10 MG tablet Take 10 mg by mouth daily as needed for allergies.      Marland Kitchen LORazepam (ATIVAN) 0.5 MG tablet Take 1 tablet (0.5 mg total) by mouth every 8 (eight) hours as needed for anxiety. 20 tablet 0  . traMADol (ULTRAM) 50 MG tablet Take 1-2 tablets (50-100 mg total) by mouth every 6 (six) hours as needed. 20 tablet 0  . acetaminophen-codeine (TYLENOL #3) 300-30 MG tablet Take 1 tablet by mouth every 4 (four) hours as needed for moderate pain. 15 tablet 0   No current facility-administered medications for this visit.     PHYSICAL EXAMINATION: ECOG PERFORMANCE STATUS: 1 - Symptomatic but completely ambulatory  Vitals:   10/19/19 1021  BP: 137/66  Pulse: 73  Resp: 18  Temp: 97.9 F (36.6 C)  SpO2: 99%   Filed Weights   10/19/19 1021  Weight: 181 lb 9.6 oz (82.4 kg)    GENERAL:alert, no distress and comfortable SKIN: skin color, texture, turgor are normal, no rashes or significant lesions EYES: normal, Conjunctiva are pink and non-injected, sclera clear  NECK: supple, thyroid normal size, non-tender, without nodularity LYMPH:  no palpable lymphadenopathy in the cervical, axillary  LUNGS: clear to auscultation and percussion with normal breathing effort HEART: regular rate & rhythm and no murmurs and no lower extremity edema ABDOMEN:abdomen soft, non-tender and normal bowel sounds Musculoskeletal:no cyanosis of digits and no clubbing  NEURO: alert & oriented x 3 with fluent speech, no focal motor/sensory deficits BREAST: S/p b/l lumpectomy: Surgical incisions healing well. No palpable mass, nodules or adenopathy bilaterally. Breast exam benign.   LABORATORY DATA:  I have reviewed the data as listed CBC Latest Ref Rng & Units 11/17/2018 08/21/2014 10/27/2013  WBC 4.0 - 10.5 K/uL 6.9 7.0 6.6  Hemoglobin 12.0 - 15.0 g/dL 14.7 13.4 13.6  Hematocrit 36.0 - 46.0 % 47.0(H) 40.0 40.2  Platelets 150 - 400 K/uL 308 337.0 320     CMP Latest Ref Rng & Units 11/17/2018 01/07/2018 05/18/2016  Glucose 70 - 99 mg/dL 105(H) 113(H) 114(H)  BUN 8 - 23 mg/dL _0 Creatinine 0.44 - 1.00 mg/dL 0.84 0.88 0.86  Sodium 135 - 145 mmol/L 140 140 137  Potassium 3.5 - 5.1 mmol/L 4.1 4.3 3.9  Chloride 98 - 111 mmol/L 105 105 104  CO2 22 - 32 mmol/L _1 Calcium 8.9 - 10.3 mg/dL 9.3 9.4 9.3  Total Protein 6.5 - 8.1 g/dL 7.3 - -  Total Bilirubin 0.3 - 1.2 mg/dL 0.2(L) - -  Alkaline Phos 38 - 126 U/L 64 - -  AST 15 - 41 U/L 18 - -  ALT 0 - 44 U/L 19 - -      RADIOGRAPHIC STUDIES: I have personally reviewed the radiological images as listed and agreed with the findings in the report. No results found.   ASSESSMENT & PLAN:  Shelly [name] is a 73 y.o. female with   1 Cancer of central portion of left female breast, Stage IA, c(T1bN0M0), ER/PR+, HER2-, Grade II -She was diagnosed in 08/2019. She underwent B/l breast lumpectomy and SLNB with Dr. Marlou Starks on 10/06/19.  -We discussed her pathology in detail which showed complete resection of her 1.8cm left breast cancer, but with positive anterior margin and 1/3 removed left axillary LN were positive.  Her right breast lumpectomy was benign.  -Given her positive margin of left breast Dr. Marlou Starks recommended re-excision surgery to obtain clear margins. She is agreeable. Will be done in next few weeks  -Her Mammaprint showed High Risk Luminal Type B. We discussed the risk of recurrence in the next 10 years in high risk disease is 29%.   -Given her high risk, she would benefit from adjuvant chemotherapy which I recommend. We discussed chemo options of more intensive AC-T over 5-6 months, moderately intensive TC q3weeks for 4 cycles or less intensive but probably less effective weekly Taxol or Abraxane for 12 weeks. Given her advanced age but overall good performance status, I recommend proceeding with TC. We can adjust dose or treatment if needed if she does not tolerate the first cycle chemo well.   --Chemotherapy consent: Side effects including but does not limited to, fatigue, nausea, vomiting, diarrhea, hair  loss, neuropathy, fluid retention, renal and kidney dysfunction, neutropenic fever, needed for blood transfusion, bleeding, were discussed with patient in great detail. She will think about it but is leaning towards TC regimen.   -I reviewed options of PAC and its risks. She declined PAC.   -Given her positive lymph node, adjuvant radiation with Dr Isidore Moos is recommended to reduce her risk of local recurrence.   -Given her ER/PR positive disease she would benefit from antiestrogen therapy to reduce her risk of distant recurrence, for 5-7 years. -She is recovering well from surgery but has moderate b/l breast pain. She did not tolerate whole Tramadol She can continue tylenol for moderate pain but severe I will call in Tylenol #3 for her. Physical exam shows incisions healing well.  -F/u 2-3 weeks after next surgery    2. Genetic Testing  -She has 3 maternal family members who had breast cancer and overall significant family history of cancer. She was previously referred, no appointment has been set up.  -will refer today   3. Osteoporosis  -Diagnosed prior to 2007 in [place] in 06/2015 (-3.2 at [facility]) -On Prolia injections every 6 months since 2017-2018 -I would recommend she have updated baseline DEXA before start of antiestrogen therapy.   PLAN:  -I called in Tylenol #3 today  -Proceed with Re-excision surgery with Dr Marlou Starks in a few weeks, she declined port placement  -F/u 2-3 weeks after surgery, plan to start adjuvant chemo TC 3-4 weeks after surgery   No problem-specific Assessment & Plan notes found for this encounter.   No orders of the defined types were placed in this encounter.  All questions were answered. The patient knows to call the clinic with any problems, questions or concerns. No barriers to learning was detected. I spent 30 minutes counseling the patient face to face. The total time spent in the appointment was 40 minutes and more than 50% was on  counseling and review of test results     Truitt Merle, MD 10/19/2019   I, Joslyn Devon, am acting as scribe for Truitt Merle, MD.   I have reviewed the above documentation  for accuracy and completeness, and I agree with the above.

## 2019-10-19 ENCOUNTER — Other Ambulatory Visit: Payer: Self-pay

## 2019-10-19 ENCOUNTER — Inpatient Hospital Stay: Payer: Medicare Other | Attending: Hematology | Admitting: Hematology

## 2019-10-19 ENCOUNTER — Encounter: Payer: Self-pay | Admitting: Hematology

## 2019-10-19 VITALS — BP 137/66 | HR 73 | Temp 97.9°F | Resp 18 | Ht 63.0 in | Wt 181.6 lb

## 2019-10-19 DIAGNOSIS — C50112 Malignant neoplasm of central portion of left female breast: Secondary | ICD-10-CM | POA: Diagnosis not present

## 2019-10-19 DIAGNOSIS — Z170 Estrogen receptor positive status [ER+]: Secondary | ICD-10-CM

## 2019-10-19 DIAGNOSIS — M810 Age-related osteoporosis without current pathological fracture: Secondary | ICD-10-CM | POA: Diagnosis not present

## 2019-10-19 MED ORDER — ACETAMINOPHEN-CODEINE #3 300-30 MG PO TABS: 1.0000 | ORAL_TABLET | ORAL | 0 refills | Status: DC | PRN

## 2019-10-19 NOTE — Progress Notes (Signed)
START ON PATHWAY REGIMEN - Breast     A cycle is every 21 days:     Docetaxel      Cyclophosphamide   **Always confirm dose/schedule in your pharmacy ordering system**  Patient Characteristics: Postoperative without Neoadjuvant Therapy (Pathologic Staging), Invasive Disease, Adjuvant Therapy, HER2 Negative/Unknown/Equivocal, ER Positive, Node Positive, Node Positive (1-3), MammaPrint(R) Ordered, High Genomic Risk Therapeutic Status: Postoperative without Neoadjuvant Therapy (Pathologic Staging) AJCC Grade: G2 AJCC N Category: pN1a AJCC M Category: cM0 ER Status: Positive (+) AJCC 8 Stage Grouping: IA HER2 Status: Negative (-) Oncotype Dx Recurrence Score: Not Appropriate AJCC T Category: pT1c PR Status: Positive (+) Has this patient completed genomic testing<= Yes - MammaPrint(R) MammaPrint(R) Score: High Genomic Risk Intent of Therapy: Curative Intent, Discussed with Patient

## 2019-10-23 ENCOUNTER — Telehealth: Payer: Self-pay | Admitting: Hematology

## 2019-10-23 ENCOUNTER — Telehealth: Payer: Self-pay

## 2019-10-23 NOTE — Telephone Encounter (Signed)
Scheduled appt per 12/04 sch message - pt is aware of appt .

## 2019-10-23 NOTE — Telephone Encounter (Signed)
I called her back, she is still struggling with her decision of having a port or not. She finally agreed to hold on port placement for now.   Truitt Merle MD

## 2019-10-23 NOTE — Telephone Encounter (Signed)
Patient calls requesting Dr. Burr Medico call her, has some questions regarding her upcoming surgery.

## 2019-10-27 ENCOUNTER — Encounter (HOSPITAL_COMMUNITY): Payer: Self-pay | Admitting: Hematology

## 2019-10-31 ENCOUNTER — Encounter (HOSPITAL_BASED_OUTPATIENT_CLINIC_OR_DEPARTMENT_OTHER): Payer: Self-pay | Admitting: General Surgery

## 2019-10-31 ENCOUNTER — Other Ambulatory Visit: Payer: Self-pay

## 2019-11-02 ENCOUNTER — Other Ambulatory Visit (HOSPITAL_COMMUNITY)
Admission: RE | Admit: 2019-11-02 | Discharge: 2019-11-02 | Disposition: A | Discharge status: Home / Self Care | Payer: Medicare Other | Source: Ambulatory Visit | Attending: General Surgery | Admitting: General Surgery

## 2019-11-02 DIAGNOSIS — Z20828 Contact with and (suspected) exposure to other viral communicable diseases: Secondary | ICD-10-CM | POA: Diagnosis not present

## 2019-11-02 DIAGNOSIS — Z01812 Encounter for preprocedural laboratory examination: Secondary | ICD-10-CM | POA: Diagnosis present

## 2019-11-02 NOTE — Progress Notes (Signed)
      Enhanced Recovery after Surgery for Orthopedics Enhanced Recovery after Surgery is a protocol used to improve the stress on your body and your recovery after surgery.  Patient Instructions  . The night before surgery:  o No food after midnight. ONLY clear liquids after midnight  . The day of surgery (if you do NOT have diabetes):  o Drink ONE (1) Pre-Surgery Clear Ensure as directed.   o This drink was given to you during your hospital  pre-op appointment visit. o The pre-op nurse will instruct you on the time to drink the  Pre-Surgery Ensure depending on your surgery time. o Finish the drink at the designated time by the pre-op nurse.  o Nothing else to drink after completing the  Pre-Surgery Clear Ensure.  . The day of surgery (if you have diabetes): o Drink ONE (1) Gatorade 2 (G2) as directed. o This drink was given to you during your hospital  pre-op appointment visit.  o The pre-op nurse will instruct you on the time to drink the   Gatorade 2 (G2) depending on your surgery time. o Color of the Gatorade may vary. Red is not allowed. o Nothing else to drink after completing the  Gatorade 2 (G2).         If you have questions, please contact your surgeon's office.  Finish ensure by 0600. Hibiclens given for night before and morning of surgery

## 2019-11-03 LAB — NOVEL CORONAVIRUS, NAA (HOSP ORDER, SEND-OUT TO REF LAB; TAT 18-24 HRS): SARS-CoV-2, NAA: NOT DETECTED

## 2019-11-06 ENCOUNTER — Ambulatory Visit (HOSPITAL_BASED_OUTPATIENT_CLINIC_OR_DEPARTMENT_OTHER): Payer: Medicare Other | Admitting: Anesthesiology

## 2019-11-06 ENCOUNTER — Encounter (HOSPITAL_BASED_OUTPATIENT_CLINIC_OR_DEPARTMENT_OTHER)
Admission: RE | Disposition: A | Discharge status: Home / Self Care | Payer: Self-pay | Source: Home / Self Care | Attending: General Surgery

## 2019-11-06 ENCOUNTER — Other Ambulatory Visit: Payer: Self-pay

## 2019-11-06 ENCOUNTER — Ambulatory Visit (HOSPITAL_BASED_OUTPATIENT_CLINIC_OR_DEPARTMENT_OTHER)
Admission: RE | Admit: 2019-11-06 | Discharge: 2019-11-06 | Disposition: A | Discharge status: Home / Self Care | Payer: Medicare Other | Attending: General Surgery | Admitting: General Surgery

## 2019-11-06 ENCOUNTER — Encounter (HOSPITAL_BASED_OUTPATIENT_CLINIC_OR_DEPARTMENT_OTHER): Payer: Self-pay | Admitting: General Surgery

## 2019-11-06 DIAGNOSIS — L821 Other seborrheic keratosis: Secondary | ICD-10-CM | POA: Diagnosis not present

## 2019-11-06 DIAGNOSIS — K589 Irritable bowel syndrome without diarrhea: Secondary | ICD-10-CM | POA: Diagnosis not present

## 2019-11-06 DIAGNOSIS — Z87891 Personal history of nicotine dependence: Secondary | ICD-10-CM | POA: Insufficient documentation

## 2019-11-06 DIAGNOSIS — D0512 Intraductal carcinoma in situ of left breast: Secondary | ICD-10-CM | POA: Insufficient documentation

## 2019-11-06 DIAGNOSIS — M1990 Unspecified osteoarthritis, unspecified site: Secondary | ICD-10-CM | POA: Insufficient documentation

## 2019-11-06 DIAGNOSIS — Z882 Allergy status to sulfonamides status: Secondary | ICD-10-CM | POA: Diagnosis not present

## 2019-11-06 DIAGNOSIS — R7303 Prediabetes: Secondary | ICD-10-CM | POA: Insufficient documentation

## 2019-11-06 DIAGNOSIS — Z79899 Other long term (current) drug therapy: Secondary | ICD-10-CM | POA: Diagnosis not present

## 2019-11-06 DIAGNOSIS — K219 Gastro-esophageal reflux disease without esophagitis: Secondary | ICD-10-CM | POA: Diagnosis not present

## 2019-11-06 DIAGNOSIS — F419 Anxiety disorder, unspecified: Secondary | ICD-10-CM | POA: Diagnosis not present

## 2019-11-06 DIAGNOSIS — M810 Age-related osteoporosis without current pathological fracture: Secondary | ICD-10-CM | POA: Insufficient documentation

## 2019-11-06 DIAGNOSIS — Z170 Estrogen receptor positive status [ER+]: Secondary | ICD-10-CM | POA: Insufficient documentation

## 2019-11-06 DIAGNOSIS — K5790 Diverticulosis of intestine, part unspecified, without perforation or abscess without bleeding: Secondary | ICD-10-CM | POA: Insufficient documentation

## 2019-11-06 DIAGNOSIS — C50412 Malignant neoplasm of upper-outer quadrant of left female breast: Secondary | ICD-10-CM | POA: Diagnosis present

## 2019-11-06 DIAGNOSIS — N6092 Unspecified benign mammary dysplasia of left breast: Secondary | ICD-10-CM | POA: Diagnosis not present

## 2019-11-06 HISTORY — PX: MASS EXCISION: SHX2000

## 2019-11-06 SURGERY — EXCISION MASS
Anesthesia: General | Site: Breast | Laterality: Left

## 2019-11-06 MED ORDER — BUPIVACAINE HCL (PF) 0.25 % IJ SOLN
INTRAMUSCULAR | Status: DC | PRN
  Administered 2019-11-06: 10 mL

## 2019-11-06 MED ORDER — CHLORHEXIDINE GLUCONATE CLOTH 2 % EX PADS: 6.0000 | MEDICATED_PAD | Freq: Once | CUTANEOUS | Status: DC

## 2019-11-06 MED ORDER — GABAPENTIN 300 MG PO CAPS
ORAL_CAPSULE | ORAL | Status: AC
  Filled 2019-11-06: qty 1

## 2019-11-06 MED ORDER — FENTANYL CITRATE (PF) 100 MCG/2ML IJ SOLN
INTRAMUSCULAR | Status: DC | PRN
  Administered 2019-11-06: 50 ug via INTRAVENOUS
  Administered 2019-11-06 (×2): 25 ug via INTRAVENOUS

## 2019-11-06 MED ORDER — LIDOCAINE 2% (20 MG/ML) 5 ML SYRINGE
INTRAMUSCULAR | Status: DC | PRN
  Administered 2019-11-06: 100 mg via INTRAVENOUS

## 2019-11-06 MED ORDER — ONDANSETRON HCL 4 MG/2ML IJ SOLN: 4.0000 mg | Freq: Once | INTRAMUSCULAR | Status: DC | PRN

## 2019-11-06 MED ORDER — FENTANYL CITRATE (PF) 100 MCG/2ML IJ SOLN
INTRAMUSCULAR | Status: AC
  Filled 2019-11-06: qty 2

## 2019-11-06 MED ORDER — EPHEDRINE SULFATE-NACL 50-0.9 MG/10ML-% IV SOSY
PREFILLED_SYRINGE | INTRAVENOUS | Status: DC | PRN
  Administered 2019-11-06: 5 mg via INTRAVENOUS

## 2019-11-06 MED ORDER — ONDANSETRON HCL 4 MG/2ML IJ SOLN
INTRAMUSCULAR | Status: DC | PRN
  Administered 2019-11-06: 4 mg via INTRAVENOUS

## 2019-11-06 MED ORDER — OXYCODONE HCL 5 MG PO TABS: 5.0000 mg | ORAL_TABLET | Freq: Once | ORAL | Status: DC | PRN

## 2019-11-06 MED ORDER — ACETAMINOPHEN 500 MG PO TABS
ORAL_TABLET | ORAL | Status: AC
  Filled 2019-11-06: qty 2

## 2019-11-06 MED ORDER — ONDANSETRON HCL 4 MG/2ML IJ SOLN
INTRAMUSCULAR | Status: AC
  Filled 2019-11-06: qty 6

## 2019-11-06 MED ORDER — DEXAMETHASONE SODIUM PHOSPHATE 10 MG/ML IJ SOLN
INTRAMUSCULAR | Status: AC
  Filled 2019-11-06: qty 3

## 2019-11-06 MED ORDER — HEPARIN (PORCINE) IN NACL 1000-0.9 UT/500ML-% IV SOLN
INTRAVENOUS | Status: AC
  Filled 2019-11-06: qty 500

## 2019-11-06 MED ORDER — DEXAMETHASONE SODIUM PHOSPHATE 4 MG/ML IJ SOLN
INTRAMUSCULAR | Status: DC | PRN
  Administered 2019-11-06: 10 mg via INTRAVENOUS

## 2019-11-06 MED ORDER — MIDAZOLAM HCL 2 MG/2ML IJ SOLN: 1.0000 mg | INTRAMUSCULAR | Status: DC | PRN

## 2019-11-06 MED ORDER — CEFAZOLIN SODIUM-DEXTROSE 2-4 GM/100ML-% IV SOLN
2.0000 g | INTRAVENOUS | Status: AC
  Administered 2019-11-06: 2 g via INTRAVENOUS

## 2019-11-06 MED ORDER — PROPOFOL 10 MG/ML IV BOLUS
INTRAVENOUS | Status: DC | PRN
  Administered 2019-11-06: 160 mg via INTRAVENOUS

## 2019-11-06 MED ORDER — HYDROCODONE-ACETAMINOPHEN 5-325 MG PO TABS: 1.0000 | ORAL_TABLET | Freq: Four times a day (QID) | ORAL | 0 refills | Status: DC | PRN

## 2019-11-06 MED ORDER — LIDOCAINE 2% (20 MG/ML) 5 ML SYRINGE
INTRAMUSCULAR | Status: AC
  Filled 2019-11-06: qty 15

## 2019-11-06 MED ORDER — FENTANYL CITRATE (PF) 100 MCG/2ML IJ SOLN: 50.0000 ug | INTRAMUSCULAR | Status: DC | PRN

## 2019-11-06 MED ORDER — GABAPENTIN 300 MG PO CAPS
300.0000 mg | ORAL_CAPSULE | ORAL | Status: AC
  Administered 2019-11-06: 300 mg via ORAL

## 2019-11-06 MED ORDER — ROCURONIUM BROMIDE 10 MG/ML (PF) SYRINGE
PREFILLED_SYRINGE | INTRAVENOUS | Status: AC
  Filled 2019-11-06: qty 10

## 2019-11-06 MED ORDER — TRAMADOL HCL 50 MG PO TABS: 50.0000 mg | ORAL_TABLET | Freq: Four times a day (QID) | ORAL | 0 refills | Status: DC | PRN

## 2019-11-06 MED ORDER — FENTANYL CITRATE (PF) 100 MCG/2ML IJ SOLN
25.0000 ug | INTRAMUSCULAR | Status: DC | PRN
  Administered 2019-11-06 (×4): 25 ug via INTRAVENOUS

## 2019-11-06 MED ORDER — PROPOFOL 10 MG/ML IV BOLUS
INTRAVENOUS | Status: AC
  Filled 2019-11-06: qty 20

## 2019-11-06 MED ORDER — OXYCODONE HCL 5 MG/5ML PO SOLN: 5.0000 mg | Freq: Once | ORAL | Status: DC | PRN

## 2019-11-06 MED ORDER — CEFAZOLIN SODIUM-DEXTROSE 2-4 GM/100ML-% IV SOLN
INTRAVENOUS | Status: AC
  Filled 2019-11-06: qty 100

## 2019-11-06 MED ORDER — LACTATED RINGERS IV SOLN: INTRAVENOUS | Status: DC

## 2019-11-06 MED ORDER — HEPARIN SOD (PORK) LOCK FLUSH 100 UNIT/ML IV SOLN
INTRAVENOUS | Status: AC
  Filled 2019-11-06: qty 5

## 2019-11-06 MED ORDER — ACETAMINOPHEN 500 MG PO TABS
1000.0000 mg | ORAL_TABLET | ORAL | Status: AC
  Administered 2019-11-06: 1000 mg via ORAL

## 2019-11-06 SURGICAL SUPPLY — 70 items
ADH SKN CLS APL DERMABOND .7 (GAUZE/BANDAGES/DRESSINGS) ×2
APL PRP STRL LF DISP 70% ISPRP (MISCELLANEOUS) ×2
APPLIER CLIP 9.375 MED OPEN (MISCELLANEOUS) ×3
APR CLP MED 9.3 20 MLT OPN (MISCELLANEOUS) ×2
BAG DECANTER FOR FLEXI CONT (MISCELLANEOUS) ×1 IMPLANT
BLADE SURG 10 STRL SS (BLADE) ×3 IMPLANT
BLADE SURG 15 STRL LF DISP TIS (BLADE) ×2 IMPLANT
BLADE SURG 15 STRL SS (BLADE) ×3
CANISTER SUCT 1200ML W/VALVE (MISCELLANEOUS) ×2 IMPLANT
CHLORAPREP W/TINT 26 (MISCELLANEOUS) ×3 IMPLANT
CLEANER CAUTERY TIP 5X5 PAD (MISCELLANEOUS) ×1 IMPLANT
CLIP APPLIE 9.375 MED OPEN (MISCELLANEOUS) ×1 IMPLANT
COVER BACK TABLE REUSABLE LG (DRAPES) ×3 IMPLANT
COVER MAYO STAND REUSABLE (DRAPES) ×3 IMPLANT
COVER WAND RF STERILE (DRAPES) IMPLANT
DECANTER SPIKE VIAL GLASS SM (MISCELLANEOUS) IMPLANT
DERMABOND ADVANCED (GAUZE/BANDAGES/DRESSINGS) ×1
DERMABOND ADVANCED .7 DNX12 (GAUZE/BANDAGES/DRESSINGS) ×2 IMPLANT
DRAPE C-ARM 42X72 X-RAY (DRAPES) ×1 IMPLANT
DRAPE LAPAROSCOPIC ABDOMINAL (DRAPES) ×1 IMPLANT
DRAPE LAPAROTOMY 100X72 PEDS (DRAPES) ×3 IMPLANT
DRAPE UTILITY XL STRL (DRAPES) ×3 IMPLANT
ELECT COATED BLADE 2.86 ST (ELECTRODE) ×3 IMPLANT
ELECT REM PT RETURN 9FT ADLT (ELECTROSURGICAL) ×3
ELECTRODE REM PT RTRN 9FT ADLT (ELECTROSURGICAL) ×2 IMPLANT
GAUZE 4X4 16PLY RFD (DISPOSABLE) IMPLANT
GLOVE BIO SURGEON STRL SZ7 (GLOVE) ×2 IMPLANT
GLOVE BIO SURGEON STRL SZ7.5 (GLOVE) ×3 IMPLANT
GLOVE BIOGEL PI IND STRL 7.0 (GLOVE) ×1 IMPLANT
GLOVE BIOGEL PI INDICATOR 7.0 (GLOVE) ×1
GOWN STRL REUS W/ TWL LRG LVL3 (GOWN DISPOSABLE) ×4 IMPLANT
GOWN STRL REUS W/TWL LRG LVL3 (GOWN DISPOSABLE) ×6
IV KIT MINILOC 20X1 SAFETY (NEEDLE) IMPLANT
NDL HYPO 25X1 1.5 SAFETY (NEEDLE) ×1 IMPLANT
NDL SAFETY ECLIPSE 18X1.5 (NEEDLE) IMPLANT
NDL SPNL 22GX3.5 QUINCKE BK (NEEDLE) IMPLANT
NEEDLE HYPO 18GX1.5 SHARP (NEEDLE)
NEEDLE HYPO 22GX1.5 SAFETY (NEEDLE) IMPLANT
NEEDLE HYPO 25X1 1.5 SAFETY (NEEDLE) ×3 IMPLANT
NEEDLE SPNL 22GX3.5 QUINCKE BK (NEEDLE) IMPLANT
NS IRRIG 1000ML POUR BTL (IV SOLUTION) ×1 IMPLANT
PACK BASIN DAY SURGERY FS (CUSTOM PROCEDURE TRAY) ×3 IMPLANT
PAD CLEANER CAUTERY TIP 5X5 (MISCELLANEOUS)
PENCIL SMOKE EVACUATOR (MISCELLANEOUS) ×3 IMPLANT
SLEEVE SCD COMPRESS KNEE MED (MISCELLANEOUS) ×3 IMPLANT
SPONGE LAP 18X18 RF (DISPOSABLE) ×3 IMPLANT
SUT CHROMIC 3 0 SH 27 (SUTURE) IMPLANT
SUT ETHILON 3 0 PS 1 (SUTURE) IMPLANT
SUT MON AB 4-0 PC3 18 (SUTURE) ×3 IMPLANT
SUT PROLENE 2 0 SH DA (SUTURE) ×1 IMPLANT
SUT PROLENE 3 0 PS 2 (SUTURE) IMPLANT
SUT SILK 2 0 PERMA HAND 18 BK (SUTURE) IMPLANT
SUT SILK 2 0 TIES 17X18 (SUTURE)
SUT SILK 2-0 18XBRD TIE BLK (SUTURE) IMPLANT
SUT VIC AB 3-0 54X BRD REEL (SUTURE) IMPLANT
SUT VIC AB 3-0 BRD 54 (SUTURE)
SUT VIC AB 3-0 FS2 27 (SUTURE) IMPLANT
SUT VIC AB 3-0 SH 27 (SUTURE) ×3
SUT VIC AB 3-0 SH 27X BRD (SUTURE) ×2 IMPLANT
SUT VIC AB 4-0 RB1 27 (SUTURE)
SUT VIC AB 4-0 RB1 27X BRD (SUTURE) IMPLANT
SUT VICRYL 3-0 CR8 SH (SUTURE) ×2 IMPLANT
SUT VICRYL 4-0 PS2 18IN ABS (SUTURE) IMPLANT
SUT VICRYL AB 3 0 TIES (SUTURE) IMPLANT
SYR 10ML LL (SYRINGE) IMPLANT
SYR 5ML LL (SYRINGE) ×1 IMPLANT
SYR CONTROL 10ML LL (SYRINGE) ×4 IMPLANT
TOWEL GREEN STERILE FF (TOWEL DISPOSABLE) ×6 IMPLANT
TUBE CONNECTING 20X1/4 (TUBING) ×2 IMPLANT
YANKAUER SUCT BULB TIP NO VENT (SUCTIONS) ×2 IMPLANT

## 2019-11-06 NOTE — Anesthesia Procedure Notes (Signed)
Procedure Name: LMA Insertion Date/Time: 11/06/2019 9:55 AM Performed by: Candis Shine, CRNA Pre-anesthesia Checklist: Patient identified, Emergency Drugs available, Suction available and Patient being monitored Patient Re-evaluated:Patient Re-evaluated prior to induction Oxygen Delivery Method: Circle System Utilized Preoxygenation: Pre-oxygenation with 100% oxygen Induction Type: IV induction LMA: LMA inserted LMA Size: 4.0 Number of attempts: 1 Placement Confirmation: positive ETCO2 Tube secured with: Tape Dental Injury: Teeth and Oropharynx as per pre-operative assessment

## 2019-11-06 NOTE — Op Note (Signed)
11/06/2019  10:36 AM  PATIENT:  Shelly [name]  73 y.o. female  PRE-OPERATIVE DIAGNOSIS:  LEFT BREAST CANCER  POST-OPERATIVE DIAGNOSIS:  LEFT BREAST CANCER  PROCEDURE:  Procedure(s): CENTRAL LUMPECTOMY WITH REEXCISION ANTERIOR MARGIN  SURGEON:  Surgeon(s) and Role:    * Jovita Kussmaul, MD - Primary  PHYSICIAN ASSISTANT:   ASSISTANTS: none   ANESTHESIA:   local and general  EBL:  minimal   BLOOD ADMINISTERED:none  DRAINS: none   LOCAL MEDICATIONS USED:  MARCAINE     SPECIMEN:  Source of Specimen:  left breast tissue  DISPOSITION OF SPECIMEN:  PATHOLOGY  COUNTS:  YES  TOURNIQUET:  * No tourniquets in log *  DICTATION: .Dragon Dictation   After informed consent was obtained the patient was brought to the operating room and placed in the supine position on the operating table.  After adequate induction of general anesthesia the patient's left breast was prepped with ChloraPrep, allowed to dry, and draped in usual sterile manner.  An appropriate timeout was performed.  The patient had a previous lumpectomy with a positive margin near the nipple and areola.  She returns now for a central lumpectomy to reexcise the anterior margin.  The area around this was infiltrated with quarter percent Marcaine.  An elliptical incision was made around the nipple and areola complex in order to incorporate the old scar.  The incision was carried through the skin and subcutaneous tissue sharply with electrocautery.  Dissection was then carried around the previous lumpectomy cavity sharply with electrocautery until the entire lumpectomy cavity was removed.  Once the specimen was removed it was oriented with the appropriate paint colors.  The specimen was then sent to pathology for further evaluation.  Hemostasis was achieved using the Bovie electrocautery.  The cavity was irrigated with saline and marked with clips.  The deep layer of the wound was then closed with layers of interrupted 3-0  Vicryl stitches.  The skin was closed with a running 4-0 Monocryl subcuticular stitch.  Dermabond dressings were applied.  The patient tolerated the procedure well.  At the end of the case all needle sponge and instrument counts were correct.  The patient was then awakened and taken recovery in stable condition.  PLAN OF CARE: Discharge to home after PACU  PATIENT DISPOSITION:  PACU - hemodynamically stable.   Delay start of Pharmacological VTE agent (>24hrs) due to surgical blood loss or risk of bleeding: not applicable

## 2019-11-06 NOTE — H&P (Signed)
Shelly [name]  Location: [facility] Patient #: [id number] DOB: May 21, 1946 Married / Language: English / Race: White Female   History of Present Illness  The patient is a 73 year old female who presents for a follow-up for Breast cancer. The patient is a 73 year old white female who is about 3 weeks status post left breast lumpectomy and sentinel node biopsy as well as a right breast lumpectomy for benign disease. The left breast cancer was a T1 cN1 a cancer that was ER and PR positive and HER-2 negative with a Ki-67 of 12%. She does report some soreness in the left breast. She did have a positive anterior margin and a close superior margin on the left side with one of 3 nodes positive. She does fit the Z 11 criteria and will not likely need any more nodes removed. She is scheduled to have the margins leaned up next week   Allergies  Sulfa Antibiotics  Allergies Reconciled   Medication History  LORazepam (0.5MG Tablet, Oral) Active. Librax (5-2.5MG Capsule, Oral) Active. Medications Reconciled    Review of Systems  General Not Present- Appetite Loss, Chills, Fatigue, Fever, Night Sweats, Weight Gain and Weight Loss. Skin Present- Rash. Not Present- Change in Wart/Mole, Dryness, Hives, Jaundice, New Lesions, Non-Healing Wounds and Ulcer. HEENT Present- Seasonal Allergies. Not Present- Earache, Hearing Loss, Hoarseness, Nose Bleed, Oral Ulcers, Ringing in the Ears, Sinus Pain, Sore Throat, Visual Disturbances, Wears glasses/contact lenses and Yellow Eyes. Breast Present- Skin Changes. Not Present- Breast Mass, Breast Pain and Nipple Discharge. Cardiovascular Not Present- Chest Pain, Difficulty Breathing Lying Down, Leg Cramps, Palpitations, Rapid Heart Rate, Shortness of Breath and Swelling of Extremities. Gastrointestinal Present- Excessive gas. Not Present- Abdominal Pain, Bloating, Bloody Stool, Change in Bowel Habits, Chronic diarrhea, Constipation, Difficulty  Swallowing, Gets full quickly at meals, Hemorrhoids, Indigestion, Nausea, Rectal Pain and Vomiting. Female Genitourinary Present- Frequency. Not Present- Nocturia, Painful Urination, Pelvic Pain and Urgency. Musculoskeletal Present- Joint Stiffness. Not Present- Back Pain, Joint Pain, Muscle Pain, Muscle Weakness and Swelling of Extremities. Neurological Not Present- Decreased Memory, Fainting, Headaches, Numbness, Seizures, Tingling, Tremor, Trouble walking and Weakness. Psychiatric Present- Anxiety and Fearful. Not Present- Bipolar, Change in Sleep Pattern, Depression and Frequent crying. Endocrine Not Present- Cold Intolerance, Excessive Hunger, Hair Changes, Heat Intolerance, Hot flashes and New Diabetes. Hematology Not Present- Blood Thinners, Easy Bruising, Excessive bleeding, Gland problems, HIV and Persistent Infections.  Vitals  Weight: 182.2 lb Height: 65in Body Surface Area: 1.9 m Body Mass Index: 30.32 kg/m  Temp.: 97.29F  Pulse: 80 (Regular)  BP: 124/88 (Sitting, Left Arm, Standard)       Physical Exam  General Mental Status-Alert. General Appearance-Consistent with stated age. Hydration-Well hydrated. Voice-Normal.  Head and Neck Head-normocephalic, atraumatic with no lesions or palpable masses. Trachea-midline. Thyroid Gland Characteristics - normal size and consistency.  Eye Eyeball - Bilateral-Extraocular movements intact. Sclera/Conjunctiva - Bilateral-No scleral icterus.  Chest and Lung Exam Chest and lung exam reveals -quiet, even and easy respiratory effort with no use of accessory muscles and on auscultation, normal breath sounds, no adventitious sounds and normal vocal resonance. Inspection Chest Wall - Normal. Back - normal.  Breast Note: Both periareolar incisions are healing nicely with no signs of infection or seroma. The left axillary incision is also healing very well.   Cardiovascular Cardiovascular  examination reveals -normal heart sounds, regular rate and rhythm with no murmurs and normal pedal pulses bilaterally.  Abdomen Inspection Inspection of the abdomen reveals - No Hernias. Skin -  Scar - no surgical scars. Palpation/Percussion Palpation and Percussion of the abdomen reveal - Soft, Non Tender, No Rebound tenderness, No Rigidity (guarding) and No hepatosplenomegaly. Auscultation Auscultation of the abdomen reveals - Bowel sounds normal.  Neurologic Neurologic evaluation reveals -alert and oriented x 3 with no impairment of recent or remote memory. Mental Status-Normal.  Musculoskeletal Normal Exam - Left-Upper Extremity Strength Normal and Lower Extremity Strength Normal. Normal Exam - Right-Upper Extremity Strength Normal and Lower Extremity Strength Normal.  Lymphatic Head & Neck  General Head & Neck Lymphatics: Bilateral - Description - Normal. Axillary  General Axillary Region: Bilateral - Description - Normal. Tenderness - Non Tender. Femoral & Inguinal  Generalized Femoral & Inguinal Lymphatics: Bilateral - Description - Normal. Tenderness - Non Tender.    Assessment & Plan  MALIGNANT NEOPLASM OF UPPER-OUTER QUADRANT OF LEFT BREAST IN FEMALE, ESTROGEN RECEPTOR POSITIVE (C50.412) Impression: The patient is about 3 weeks status post left breast lumpectomy for breast cancer. She tolerated the surgery well. She did have a positive anterior margin and a close superior margin and we are scheduled to re-excise these margins in the next week. I have discussed with her in detail the risks and benefits of the operation as well as some of the technical aspects and she understands and wishes to proceed. Current Plans Follow up with [place] in the office in 1 month.  Call us sooner as needed.

## 2019-11-06 NOTE — Anesthesia Preprocedure Evaluation (Addendum)
Anesthesia Evaluation  Patient identified by MRN, date of birth, ID band Patient awake    Reviewed: Allergy & Precautions, NPO status , Patient's Chart, lab work & pertinent test results  Airway Mallampati: II  TM Distance: >3 FB Neck ROM: Full    Dental no notable dental hx. (+) Teeth Intact   Pulmonary former smoker,    Pulmonary exam normal breath sounds clear to auscultation       Cardiovascular Normal cardiovascular exam Rhythm:Regular Rate:Normal     Neuro/Psych Anxiety negative neurological ROS     GI/Hepatic GERD  Medicated and Controlled,IBS  Diverticulosis    Endo/Other  Left Breast Ca Osteoporosis Prediabetes  Renal/GU   negative genitourinary   Musculoskeletal  (+) Arthritis , Osteoarthritis,    Abdominal   Peds  Hematology negative hematology ROS (+)   Anesthesia Other Findings   Reproductive/Obstetrics                             Anesthesia Physical Anesthesia Plan  ASA: II  Anesthesia Plan: General   Post-op Pain Management:    Induction: Intravenous  PONV Risk Score and Plan: 4 or greater and Ondansetron, Dexamethasone and Treatment may vary due to age or medical condition  Airway Management Planned: LMA  Additional Equipment:   Intra-op Plan:   Post-operative Plan: Extubation in OR  Informed Consent: I have reviewed the patients History and Physical, chart, labs and discussed the procedure including the risks, benefits and alternatives for the proposed anesthesia with the patient or authorized representative who has indicated his/her understanding and acceptance.     Dental advisory given  Plan Discussed with: CRNA and Surgeon  Anesthesia Plan Comments:         Anesthesia Quick Evaluation

## 2019-11-06 NOTE — Anesthesia Postprocedure Evaluation (Signed)
Anesthesia Post Note  Patient: Shelly [name]  Procedure(s) Performed: EXCISION NIPPLE AND AREOLA  MARGIN LEFT BREAST (Left Breast)     Patient location during evaluation: PACU Anesthesia Type: General Level of consciousness: awake and alert and oriented Pain management: pain level controlled Vital Signs Assessment: post-procedure vital signs reviewed and stable Respiratory status: spontaneous breathing, nonlabored ventilation and respiratory function stable Cardiovascular status: blood pressure returned to baseline and stable Postop Assessment: no apparent nausea or vomiting Anesthetic complications: no    Last Vitals:  Vitals:   11/06/19 1045 11/06/19 1100  BP: (!) 160/64 (!) 151/73  Pulse: 68 71  Resp: 14 10  Temp: 36.5 C   SpO2: 94% 93%    Last Pain:  Vitals:   11/06/19 1100  TempSrc:   PainSc: 6                  Azeem Poorman A.

## 2019-11-06 NOTE — Transfer of Care (Signed)
Immediate Anesthesia Transfer of Care Note  Patient: Shelly [name]  Procedure(s) Performed: EXCISION NIPPLE AND AREOLA  MARGIN LEFT BREAST (Left Breast)  Patient Location: PACU  Anesthesia Type:General  Level of Consciousness: awake, alert  and oriented  Airway & Oxygen Therapy: Patient Spontanous Breathing and Patient connected to face mask oxygen  Post-op Assessment: Report given to RN and Post -op Vital signs reviewed and stable  Post vital signs: Reviewed and stable  Last Vitals:  Vitals Value Taken Time  BP    Temp    Pulse 67 11/06/19 1046  Resp 14 11/06/19 1046  SpO2 93 % 11/06/19 1046  Vitals shown include unvalidated device data.  Last Pain:  Vitals:   11/06/19 0844  TempSrc: Tympanic  PainSc: 0-No pain      Patients Stated Pain Goal: 6 ([id number])  Complications: No apparent anesthesia complications

## 2019-11-06 NOTE — Discharge Instructions (Signed)
No Tylenol until 3:00 PM on 11/06/2019.   Post Anesthesia Home Care Instructions  Activity: Get plenty of rest for the remainder of the day. A responsible individual must stay with you for 24 hours following the procedure.  For the next 24 hours, DO NOT: -Drive a car -Paediatric nurse -Drink alcoholic beverages -Take any medication unless instructed by your physician -Make any legal decisions or sign important papers.  Meals: Start with liquid foods such as gelatin or soup. Progress to regular foods as tolerated. Avoid greasy, spicy, heavy foods. If nausea and/or vomiting occur, drink only clear liquids until the nausea and/or vomiting subsides. Call your physician if vomiting continues.  Special Instructions/Symptoms: Your throat may feel dry or sore from the anesthesia or the breathing tube placed in your throat during surgery. If this causes discomfort, gargle with warm salt water. The discomfort should disappear within 24 hours.  If you had a scopolamine patch placed behind your ear for the management of post- operative nausea and/or vomiting:  1. The medication in the patch is effective for 72 hours, after which it should be removed.  Wrap patch in a tissue and discard in the trash. Wash hands thoroughly with soap and water. 2. You may remove the patch earlier than 72 hours if you experience unpleasant side effects which may include dry mouth, dizziness or visual disturbances. 3. Avoid touching the patch. Wash your hands with soap and water after contact with the patch.     Call your surgeon if you experience:   1.  Fever over 101.0. 2.  Inability to urinate. 3.  Nausea and/or vomiting. 4.  Extreme swelling or bruising at the surgical site. 5.  Continued bleeding from the incision. 6.  Increased pain, redness or drainage from the incision. 7.  Problems related to your pain medication. 8.  Any problems and/or concerns

## 2019-11-09 LAB — SURGICAL PATHOLOGY

## 2019-11-20 NOTE — Progress Notes (Signed)
[place]   Telephone:([phone]) [phone] Fax:(336) [phone]   Clinic Follow up Note   Patient Care Team: Eulas Post, MD as PCP - General (Family Medicine) Barbaraann Cao, [place] as Referring Physician (Optometry) Jovita Kussmaul, MD as Consulting Physician (General Surgery)  Date of Service:  11/22/2019  CHIEF COMPLAINT: F/u of left breast cancer  SUMMARY OF ONCOLOGIC HISTORY: Oncology History Overview Note  Cancer Staging Cancer of central portion of left female breast [facility]) Staging form: Breast, AJCC 8th Edition - Clinical stage from 09/18/2019: Stage IA (cT1b, cN0, cM0, G2, ER+, PR+, HER2-) - Signed by Truitt Merle, MD on 09/18/2019 - Pathologic stage from 10/06/2019: Stage IA (pT1c, pN1a, cM0, G2, ER+, PR+, HER2-) - Signed by Truitt Merle, MD on 10/19/2019    Cancer of central portion of left female breast ([place])  08/24/2019 Mammogram   Diagnostic mammogram 08/24/19  IMPRESSION: 1. Left breast mass measuring 0.8 x 0.4 x 0.5 cm at the 1 o'clock retroareolar region is suspicious and likely corresponds to the area of distortion seen on mammography.   2. Left breast 3 o'clock retroareolar dilated duct with internal debris versus solid mass.   3. Right breast retroareolar focal asymmetry without sonographic correlate.   08/28/2019 Initial Biopsy   Final Diagnosis 08/28/19 1. Breast, left, needle core biopsy, 1 o'clock/retroareolar - INVASIVE DUCTAL CARCINOMA, GRADE II. - PERINEURAL INVASION. - SEE MICROSCOPIC DESCRIPTION. 2. Breast, left, needle core biopsy, 3 o'clock retroareolar - DUCTAL PAPILLOMA. - SEE MICROSCOPIC DESCRIPTION.   08/28/2019 Receptors her2   1. PROGNOSTIC INDICATORS 08/28/19 Results: IMMUNOHISTOCHEMICAL AND MORPHOMETRIC ANALYSIS PERFORMED MANUALLY The tumor cells are NEGATIVE for Her2 (0). Estrogen Receptor: 100%, POSITIVE, STRONG STAINING INTENSITY Progesterone Receptor: 50%, POSITIVE, STRONG STAINING INTENSITY Proliferation Marker  Ki67: 12%   09/01/2019 Pathology Results   Diagnosis 09/01/19 Breast, right, needle core biopsy, retroareolar - COMPLEX SCLEROSING LESION WITH A PAPILLARY SCLEROSING LESION AND USUAL DUCTAL HYPERPLASIA. SEE NOTE - NEGATIVE FOR CARCINOMA   09/18/2019 Initial Diagnosis   Cancer of central portion of left female breast ([place])   09/18/2019 Cancer Staging   Staging form: Breast, AJCC 8th Edition - Clinical stage from 09/18/2019: Stage IA (cT1b, cN0, cM0, G2, ER+, PR+, HER2-) - Signed by Truitt Merle, MD on 09/18/2019   10/06/2019 Surgery   LEFT BREAST LUMPECTOMY  X2 WITH RADIOACTIVE SEED X2  AND SENTINEL LYMPH NODE BIOPSY and  RIGHT BREAST LUMPECTOMY WITH RADIOACTIVE SEED LOCALIZATION by Dr Marlou Starks  10/06/19    10/06/2019 Pathology Results   FINAL MICROSCOPIC DIAGNOSIS: 10/06/19   A. BREAST, RIGHT, LUMPECTOMY:  - Fibrocystic change with a small incidental intraductal papilloma and  usual ductal hyperplasia  - Biopsy site changes  - Negative for carcinoma   B. LYMPH NODE, LEFT #1, SENTINEL, BIOPSY:  - Metastatic carcinoma to a lymph node (1/1)  - Focus of metastatic carcinoma measures 2.5 mm and shows extranodal  extension   C. LYMPH NODE, LEFT #2, SENTINEL, BIOPSY:  - Lymph node, negative for carcinoma (0/1)   D. LYMPH NODE, LEFT #3, SENTINEL, BIOPSY:  - Lymph node, negative for carcinoma (0/1)   E. BREAST, LEFT, LUMPECTOMY:  - Invasive ductal carcinoma, grade 2, 1.8 cm  - Ductal carcinoma in situ, intermediate grade  - Invasive carcinoma focally involves the anterior margin  - DCIS is less than 1 mm from the superior margin  - Background breast parenchyma with fibrocystic change, usual ductal  hyperplasia and intraductal papilloma.  - Biopsy site changes  - See  oncology table    10/06/2019 Miscellaneous   Her Mammaprint showed High Risk Luminal Type B, risk of recurrence in the next 10 years is 29% in high risk disease. MPI -0.335    10/06/2019 Cancer Staging   Staging form:  Breast, AJCC 8th Edition - Pathologic stage from 10/06/2019: Stage IA (pT1c, pN1a, cM0, G2, ER+, PR+, HER2-) - Signed by Truitt Merle, MD on 10/19/2019   11/06/2019 Surgery   EXCISION NIPPLE AND AREOLA  MARGIN LEFT BREAST by Dr Marlou Starks  11/06/19    11/06/2019 Pathology Results    FINAL MICROSCOPIC DIAGNOSIS: 11/06/19  A. BREAST, LEFT CENTRAL, RE-EXCISION:  -  Residual ductal carcinoma in situ, intermediate grade  -  Margins uninvolved by carcinoma (0.4 cm; superior margin)  -  Atypical intraductal papilloma  -  Usual ductal hyperplasia  -  Benign skin with seborrheic keratosis  -  Previous surgical site changes  -  No invasive carcinoma identified     Chemotherapy   PENDING Adjuvant chemo TC q3weeks for 4 cycles       CURRENT THERAPY:  PENDING Adjuvant chemo TC q3weeks for 4 cycles starting in 2 weeks   INTERVAL HISTORY:  Shelly [name] is here for a follow up after surgery. She presents to the clinic with her husband. She notes her second breast surgery went well. Her initial breast pain has much improved and tenderness only. She has used tylenol q4hours. She has not needed any stronger medications. She notes anesthesiologist notes slightly lower oxygen level when she was put under.    REVIEW OF SYSTEMS:   Constitutional: Denies fevers, chills or abnormal weight loss Eyes: Denies blurriness of vision Ears, nose, mouth, throat, and face: Denies mucositis or sore throat Respiratory: Denies cough, dyspnea or wheezes Cardiovascular: Denies palpitation, chest discomfort or lower extremity swelling Gastrointestinal:  Denies nausea, heartburn or change in bowel habits Skin: Denies abnormal skin rashes Lymphatics: Denies new lymphadenopathy or easy bruising Neurological:Denies numbness, tingling or new weaknesses Behavioral/Psych: Mood is stable, no new changes  Breast: (+) Left breast tenderness All other systems were reviewed with the patient and are negative.  MEDICAL  HISTORY:  Past Medical History:  Diagnosis Date  . Arthritis    wrist, shoulder   . Cancer ([place]) 08/2019   left breast IDC  . Diverticulitis   . Endometrial polyp   . GERD (gastroesophageal reflux disease)    occasional - diet controlled  . Hx of adenomatous colonic polyps   . Hyperlipidemia   . IBS (irritable bowel syndrome)   . Missed abortion    x 1 - resolved - no surgery  . Osteoporosis   . SVD (spontaneous vaginal delivery)    x 1  . Transient vision disturbance 2019   was evaluated by ED, no findings  . Vitamin D deficiency     SURGICAL HISTORY: Past Surgical History:  Procedure Laterality Date  . BREAST LUMPECTOMY WITH RADIOACTIVE SEED AND SENTINEL LYMPH NODE BIOPSY Left 10/06/2019   Procedure: LEFT BREAST LUMPECTOMY  X2 WITH RADIOACTIVE SEED X2  AND SENTINEL LYMPH NODE BIOPSY;  Surgeon: Jovita Kussmaul, MD;  Location: [facility];  Service: General;  Laterality: Left;  . BREAST LUMPECTOMY WITH RADIOACTIVE SEED LOCALIZATION Right 10/06/2019   Procedure: RIGHT BREAST LUMPECTOMY WITH RADIOACTIVE SEED LOCALIZATION;  Surgeon: Jovita Kussmaul, MD;  Location: [facility];  Service: General;  Laterality: Right;  . COLONOSCOPY    . DIAGNOSTIC LAPAROSCOPY     cysts  .  DILATATION & CURETTAGE/HYSTEROSCOPY WITH TRUECLEAR N/A 10/31/2013   Procedure: DILATATION & CURETTAGE/HYSTEROSCOPY WITH TRUCLEAR;  Surgeon: Marylynn Pearson, MD;  Location: [facility];  Service: Gynecology;  Laterality: N/A;  . DILATION AND CURETTAGE OF UTERUS    . MASS EXCISION Left 11/06/2019   Procedure: EXCISION NIPPLE AND AREOLA  MARGIN LEFT BREAST;  Surgeon: Jovita Kussmaul, MD;  Location: [facility];  Service: General;  Laterality: Left;  . TONSILLECTOMY  1952  . WISDOM TOOTH EXTRACTION      I have reviewed the social history and family history with the patient and they are unchanged from previous note.  ALLERGIES:  is allergic to crestor [rosuvastatin calcium];  lipitor [atorvastatin calcium]; nsaids; pravastatin; and sulfonamide derivatives.  MEDICATIONS:  Current Outpatient Medications  Medication Sig Dispense Refill  . acetaminophen (TYLENOL) 500 MG tablet Take 500 mg by mouth every 6 (six) hours as needed for headache.    Marland Kitchen acetaminophen-codeine (TYLENOL #3) 300-30 MG tablet Take 1 tablet by mouth every 4 (four) hours as needed for moderate pain. (Patient taking differently: Take 1 tablet by mouth every 4 (four) hours as needed for moderate pain. ) 15 tablet 0  . bismuth subsalicylate (PEPTO BISMOL) 262 MG/15ML suspension Take 30 mLs by mouth every 6 (six) hours as needed.    . clidinium-chlordiazePOXIDE (LIBRAX) 5-2.5 MG capsule TAKE 1 CAPSULE BY MOUTH EVERY 8 (EIGHT) HOURS AS NEEDED (BOWELS). 50 capsule 1  . esomeprazole (NEXIUM) 20 MG capsule Take 20 mg by mouth daily as needed (acid reflux).     Marland Kitchen HYDROcodone-acetaminophen (NORCO/VICODIN) 5-325 MG tablet Take 1-2 tablets by mouth every 6 (six) hours as needed for moderate pain or severe pain. 10 tablet 0  . loratadine (CLARITIN) 10 MG tablet Take 10 mg by mouth daily as needed for allergies.     Marland Kitchen LORazepam (ATIVAN) 0.5 MG tablet Take 1 tablet (0.5 mg total) by mouth every 8 (eight) hours as needed for anxiety. (Patient taking differently: Take 0.5 mg by mouth every 8 (eight) hours as needed for anxiety. ) 20 tablet 0  . traMADol (ULTRAM) 50 MG tablet Take 1-2 tablets (50-100 mg total) by mouth every 6 (six) hours as needed. 20 tablet 0  . traMADol (ULTRAM) 50 MG tablet Take 1-2 tablets (50-100 mg total) by mouth every 6 (six) hours as needed. 20 tablet 0   No current facility-administered medications for this visit.    PHYSICAL EXAMINATION: ECOG PERFORMANCE STATUS: 1 - Symptomatic but completely ambulatory  Vitals:   11/22/19 1349  BP: (!) 159/73  Pulse: 77  Resp: 17  Temp: 98.3 F (36.8 C)  SpO2: 96%   Filed Weights   11/22/19 1349  Weight: 181 lb 14.4 oz (82.5 kg)     GENERAL:alert, no distress and comfortable SKIN: skin color, texture, turgor are normal, no rashes or significant lesions EYES: normal, Conjunctiva are pink and non-injected, sclera clear  NECK: supple, thyroid normal size, non-tender, without nodularity LYMPH:  no palpable lymphadenopathy in the cervical, axillary  LUNGS: clear to auscultation and percussion with normal breathing effort HEART: regular rate & rhythm and no murmurs and no lower extremity edema ABDOMEN:abdomen soft, non-tender and normal bowel sounds Musculoskeletal:no cyanosis of digits and no clubbing  NEURO: alert & oriented x 3 with fluent speech, no focal motor/sensory deficits BREAST: s/p b/l lumpectomy and left re-excision: left surgical incision healing well with mild scar tissue and right incision healed well with scar tissue. No palpable mass, nodules or adenopathy bilaterally. Breast  exam benign.    LABORATORY DATA:  I have reviewed the data as listed CBC Latest Ref Rng & Units 11/17/2018 08/21/2014 10/27/2013  WBC 4.0 - 10.5 K/uL 6.9 7.0 6.6  Hemoglobin 12.0 - 15.0 g/dL 14.7 13.4 13.6  Hematocrit 36.0 - 46.0 % 47.0(H) 40.0 40.2  Platelets 150 - 400 K/uL 308 337.0 320     CMP Latest Ref Rng & Units 11/17/2018 01/07/2018 05/18/2016  Glucose 70 - 99 mg/dL 105(H) 113(H) 114(H)  BUN 8 - 23 mg/dL _0 Creatinine 0.44 - 1.00 mg/dL 0.84 0.88 0.86  Sodium 135 - 145 mmol/L 140 140 137  Potassium 3.5 - 5.1 mmol/L 4.1 4.3 3.9  Chloride 98 - 111 mmol/L 105 105 104  CO2 22 - 32 mmol/L _1 Calcium 8.9 - 10.3 mg/dL 9.3 9.4 9.3  Total Protein 6.5 - 8.1 g/dL 7.3 - -  Total Bilirubin 0.3 - 1.2 mg/dL 0.2(L) - -  Alkaline Phos 38 - 126 U/L 64 - -  AST 15 - 41 U/L 18 - -  ALT 0 - 44 U/L 19 - -      RADIOGRAPHIC STUDIES: I have personally reviewed the radiological images as listed and agreed with the findings in the report. No results found.   ASSESSMENT & PLAN:  Shelly [name] is a 74 y.o. female with    1.Cancer of central portion of left female breast, StageIA, c(T1bN0M0), ER/PR+, HER2-, GradeII, pT1cN1a -She was diagnosed in 08/2019. She underwent B/l breast lumpectomy and SLNB with Dr. Marlou Starks on 10/06/19. Due to positive margins and 1/3 positive LNs she underwent re-excisional surgery on 11/06/19 which showed DCIS only, margins negative.  -She has high risk for recurrence based on Mammaprint. To reduce her risk of recurrence I recommend adjuvant chemo with TC q3weeks for 4 cycles. Given her advanced age, will reduce chemo dose by 10-15% for first cycle  --Chemotherapy consent: Side effects including but does not limited to, fatigue, nausea, vomiting, diarrhea, hair loss, neuropathy, fluid retention, renal and kidney dysfunction, neutropenic fever, needed for blood transfusion, bleeding, were discussed with patient in great detail. She agrees to proceed. -she is recovering well from the second surgery, incision has healed well  -Plan to start in 1-2 weeks with chemo education class before starting. I will call in antiemetics and dexa and reviewed use with her in great detail.  -I also discussed Dignicap to help preserve her hair 80-85%. She is interested, I provided her with more information.  -She is continues to recover well from surgery. Pain and energy improving well. She can continue Tylenol as needed.  -F/u in 2 weeks before first cycle chemo.    2. Genetic Testing  -She has 3 maternal family members who had breast cancer and overall significant family history of cancer. She was previously referred, she will see genetics on 11/28/18.    3. Osteoporosis  -Diagnosed prior to 2007 in [place] in 06/2015 (-3.2 at [facility]) -On Prolia injections every 6 months since 2017-2018 -I would recommend she have updated baseline DEXA before start of antiestrogen therapy.   PLAN: -I called in antiemetics and dexa today.  -Chemo education class next week  -Lab, f/u and chemo in 2  and 5 weeks, first cycle dose reduce by 10-15%, with GCSF on day 3    No problem-specific Assessment & Plan notes found for this encounter.   No orders of the defined types were placed in this encounter.  All  questions were answered. The patient knows to call the clinic with any problems, questions or concerns. No barriers to learning was detected. The total time spent in the appointment was 30 minutes.     Truitt Merle, MD 11/22/2019   I, Joslyn Devon, am acting as scribe for Truitt Merle, MD.   I have reviewed the above documentation for accuracy and completeness, and I agree with the above.

## 2019-11-21 ENCOUNTER — Telehealth: Payer: Self-pay | Admitting: Hematology

## 2019-11-21 NOTE — Telephone Encounter (Signed)
Called pt per staff message from [place] . appt on 1/13 to be virtual visit. Pt husband aware.

## 2019-11-22 ENCOUNTER — Encounter: Payer: Self-pay | Admitting: Hematology

## 2019-11-22 ENCOUNTER — Other Ambulatory Visit: Payer: Self-pay

## 2019-11-22 ENCOUNTER — Inpatient Hospital Stay: Payer: Medicare Other | Attending: Hematology | Admitting: Hematology

## 2019-11-22 VITALS — BP 159/73 | HR 77 | Temp 98.3°F | Resp 17 | Ht 66.0 in | Wt 181.9 lb

## 2019-11-22 DIAGNOSIS — G4700 Insomnia, unspecified: Secondary | ICD-10-CM | POA: Diagnosis not present

## 2019-11-22 DIAGNOSIS — K580 Irritable bowel syndrome with diarrhea: Secondary | ICD-10-CM | POA: Insufficient documentation

## 2019-11-22 DIAGNOSIS — Z779 Other contact with and (suspected) exposures hazardous to health: Secondary | ICD-10-CM | POA: Diagnosis not present

## 2019-11-22 DIAGNOSIS — F419 Anxiety disorder, unspecified: Secondary | ICD-10-CM | POA: Insufficient documentation

## 2019-11-22 DIAGNOSIS — Z170 Estrogen receptor positive status [ER+]: Secondary | ICD-10-CM | POA: Diagnosis not present

## 2019-11-22 DIAGNOSIS — Z5111 Encounter for antineoplastic chemotherapy: Secondary | ICD-10-CM | POA: Diagnosis not present

## 2019-11-22 DIAGNOSIS — K219 Gastro-esophageal reflux disease without esophagitis: Secondary | ICD-10-CM | POA: Diagnosis not present

## 2019-11-22 DIAGNOSIS — M810 Age-related osteoporosis without current pathological fracture: Secondary | ICD-10-CM | POA: Diagnosis not present

## 2019-11-22 DIAGNOSIS — Z01419 Encounter for gynecological examination (general) (routine) without abnormal findings: Secondary | ICD-10-CM | POA: Diagnosis not present

## 2019-11-22 DIAGNOSIS — C50112 Malignant neoplasm of central portion of left female breast: Secondary | ICD-10-CM | POA: Diagnosis not present

## 2019-11-22 DIAGNOSIS — Z6830 Body mass index (BMI) 30.0-30.9, adult: Secondary | ICD-10-CM | POA: Diagnosis not present

## 2019-11-23 ENCOUNTER — Telehealth: Payer: Self-pay | Admitting: Hematology

## 2019-11-23 ENCOUNTER — Encounter: Payer: Self-pay | Admitting: *Deleted

## 2019-11-23 NOTE — Telephone Encounter (Signed)
Scheduled appt per 1/6 los.  Spoke with pt and she is aware of her scheduled appt dates and time.

## 2019-11-24 ENCOUNTER — Encounter: Payer: Self-pay | Admitting: Hematology

## 2019-11-24 ENCOUNTER — Other Ambulatory Visit: Payer: Self-pay

## 2019-11-24 MED ORDER — PROCHLORPERAZINE MALEATE 10 MG PO TABS: 10.0000 mg | ORAL_TABLET | Freq: Four times a day (QID) | ORAL | 1 refills | Status: DC | PRN

## 2019-11-24 MED ORDER — DEXAMETHASONE 4 MG PO TABS: 4.0000 mg | ORAL_TABLET | Freq: Two times a day (BID) | ORAL | 1 refills | Status: DC

## 2019-11-24 MED ORDER — ONDANSETRON HCL 8 MG PO TABS: 8.0000 mg | ORAL_TABLET | Freq: Two times a day (BID) | ORAL | 1 refills | Status: DC | PRN

## 2019-11-24 NOTE — Progress Notes (Signed)
Opened in error

## 2019-11-27 ENCOUNTER — Encounter: Payer: Self-pay | Admitting: *Deleted

## 2019-11-29 ENCOUNTER — Inpatient Hospital Stay: Payer: Medicare Other | Admitting: Genetic Counselor

## 2019-11-29 ENCOUNTER — Other Ambulatory Visit: Payer: Self-pay

## 2019-11-29 ENCOUNTER — Inpatient Hospital Stay: Payer: Medicare Other

## 2019-11-29 ENCOUNTER — Other Ambulatory Visit: Payer: Medicare Other

## 2019-11-29 DIAGNOSIS — Z170 Estrogen receptor positive status [ER+]: Secondary | ICD-10-CM

## 2019-11-29 DIAGNOSIS — Z803 Family history of malignant neoplasm of breast: Secondary | ICD-10-CM

## 2019-11-29 DIAGNOSIS — C50112 Malignant neoplasm of central portion of left female breast: Secondary | ICD-10-CM

## 2019-11-30 ENCOUNTER — Encounter: Payer: Self-pay | Admitting: Genetic Counselor

## 2019-11-30 DIAGNOSIS — Z803 Family history of malignant neoplasm of breast: Secondary | ICD-10-CM | POA: Insufficient documentation

## 2019-11-30 NOTE — Progress Notes (Signed)
REFERRING PROVIDER: Truitt Merle, MD [place],  [place] [address]  PRIMARY PROVIDER:  Eulas Post, MD  PRIMARY REASON FOR VISIT:  1. Malignant neoplasm of central portion of left breast in female, estrogen receptor positive ([place])   2. Family history of breast cancer      HISTORY OF PRESENT ILLNESS:   Shelly [name], a 74 y.o. female, was seen for a [facility] cancer genetics consultation at the request of Dr. Burr Medico due to a personal and family history of breast cancer.  Shelly [name] presents to clinic today to discuss the possibility of a hereditary predisposition to cancer, genetic testing, and to further clarify her future cancer risks, as well as potential cancer risks for family members.   In October 2020, at the age of 79, Shelly [name] was diagnosed with invasive ductal carcinoma of the left breast. The treatment plan lumpectomy, chemotherapy and radiation.      CANCER HISTORY:  Oncology History Overview Note  Cancer Staging Cancer of central portion of left female breast [facility]) Staging form: Breast, AJCC 8th Edition - Clinical stage from 09/18/2019: Stage IA (cT1b, cN0, cM0, G2, ER+, PR+, HER2-) - Signed by Truitt Merle, MD on 09/18/2019 - Pathologic stage from 10/06/2019: Stage IA (pT1c, pN1a, cM0, G2, ER+, PR+, HER2-) - Signed by Truitt Merle, MD on 10/19/2019    Cancer of central portion of left female breast ([place])  08/24/2019 Mammogram   Diagnostic mammogram 08/24/19  IMPRESSION: 1. Left breast mass measuring 0.8 x 0.4 x 0.5 cm at the 1 o'clock retroareolar region is suspicious and likely corresponds to the area of distortion seen on mammography.   2. Left breast 3 o'clock retroareolar dilated duct with internal debris versus solid mass.   3. Right breast retroareolar focal asymmetry without sonographic correlate.   08/28/2019 Initial Biopsy   Final Diagnosis 08/28/19 1. Breast, left, needle core biopsy, 1 o'clock/retroareolar - INVASIVE DUCTAL CARCINOMA, GRADE  II. - PERINEURAL INVASION. - SEE MICROSCOPIC DESCRIPTION. 2. Breast, left, needle core biopsy, 3 o'clock retroareolar - DUCTAL PAPILLOMA. - SEE MICROSCOPIC DESCRIPTION.   08/28/2019 Receptors her2   1. PROGNOSTIC INDICATORS 08/28/19 Results: IMMUNOHISTOCHEMICAL AND MORPHOMETRIC ANALYSIS PERFORMED MANUALLY The tumor cells are NEGATIVE for Her2 (0). Estrogen Receptor: 100%, POSITIVE, STRONG STAINING INTENSITY Progesterone Receptor: 50%, POSITIVE, STRONG STAINING INTENSITY Proliferation Marker Ki67: 12%   09/01/2019 Pathology Results   Diagnosis 09/01/19 Breast, right, needle core biopsy, retroareolar - COMPLEX SCLEROSING LESION WITH A PAPILLARY SCLEROSING LESION AND USUAL DUCTAL HYPERPLASIA. SEE NOTE - NEGATIVE FOR CARCINOMA   09/18/2019 Initial Diagnosis   Cancer of central portion of left female breast ([place])   09/18/2019 Cancer Staging   Staging form: Breast, AJCC 8th Edition - Clinical stage from 09/18/2019: Stage IA (cT1b, cN0, cM0, G2, ER+, PR+, HER2-) - Signed by Truitt Merle, MD on 09/18/2019   10/06/2019 Surgery   LEFT BREAST LUMPECTOMY  X2 WITH RADIOACTIVE SEED X2  AND SENTINEL LYMPH NODE BIOPSY and  RIGHT BREAST LUMPECTOMY WITH RADIOACTIVE SEED LOCALIZATION by Dr Marlou Starks  10/06/19    10/06/2019 Pathology Results   FINAL MICROSCOPIC DIAGNOSIS: 10/06/19   A. BREAST, RIGHT, LUMPECTOMY:  - Fibrocystic change with a small incidental intraductal papilloma and  usual ductal hyperplasia  - Biopsy site changes  - Negative for carcinoma   B. LYMPH NODE, LEFT #1, SENTINEL, BIOPSY:  - Metastatic carcinoma to a lymph node (1/1)  - Focus of metastatic carcinoma measures 2.5 mm and shows extranodal  extension   C.  LYMPH NODE, LEFT #2, SENTINEL, BIOPSY:  - Lymph node, negative for carcinoma (0/1)   D. LYMPH NODE, LEFT #3, SENTINEL, BIOPSY:  - Lymph node, negative for carcinoma (0/1)   E. BREAST, LEFT, LUMPECTOMY:  - Invasive ductal carcinoma, grade 2, 1.8 cm  - Ductal carcinoma  in situ, intermediate grade  - Invasive carcinoma focally involves the anterior margin  - DCIS is less than 1 mm from the superior margin  - Background breast parenchyma with fibrocystic change, usual ductal  hyperplasia and intraductal papilloma.  - Biopsy site changes  - See oncology table    10/06/2019 Miscellaneous   Her Mammaprint showed High Risk Luminal Type B, risk of recurrence in the next 10 years is 29% in high risk disease. MPI -0.335    10/06/2019 Cancer Staging   Staging form: Breast, AJCC 8th Edition - Pathologic stage from 10/06/2019: Stage IA (pT1c, pN1a, cM0, G2, ER+, PR+, HER2-) - Signed by Truitt Merle, MD on 10/19/2019   11/06/2019 Surgery   EXCISION NIPPLE AND AREOLA  MARGIN LEFT BREAST by Dr Marlou Starks  11/06/19    11/06/2019 Pathology Results    FINAL MICROSCOPIC DIAGNOSIS: 11/06/19  A. BREAST, LEFT CENTRAL, RE-EXCISION:  -  Residual ductal carcinoma in situ, intermediate grade  -  Margins uninvolved by carcinoma (0.4 cm; superior margin)  -  Atypical intraductal papilloma  -  Usual ductal hyperplasia  -  Benign skin with seborrheic keratosis  -  Previous surgical site changes  -  No invasive carcinoma identified     Chemotherapy   PENDING Adjuvant chemo TC q3weeks for 4 cycles       RISK FACTORS:  Menarche was at age 20.  First live birth at age 48.  OCP use for approximately 0 years.  Ovaries intact: yes.  Hysterectomy: no.  Menopausal status: postmenopausal.  HRT use: 0 years. Colonoscopy: yes; normal. Mammogram within the last year: yes. Number of breast biopsies: 1. Up to date with pelvic exams: yes. Any excessive radiation exposure in the past: no  Past Medical History:  Diagnosis Date  . Arthritis    wrist, shoulder   . Cancer ([place]) 08/2019   left breast IDC  . Diverticulitis   . Endometrial polyp   . Family history of breast cancer   . GERD (gastroesophageal reflux disease)    occasional - diet controlled  . Hx of adenomatous  colonic polyps   . Hyperlipidemia   . IBS (irritable bowel syndrome)   . Missed abortion    x 1 - resolved - no surgery  . Osteoporosis   . SVD (spontaneous vaginal delivery)    x 1  . Transient vision disturbance 2019   was evaluated by ED, no findings  . Vitamin D deficiency     Past Surgical History:  Procedure Laterality Date  . BREAST LUMPECTOMY WITH RADIOACTIVE SEED AND SENTINEL LYMPH NODE BIOPSY Left 10/06/2019   Procedure: LEFT BREAST LUMPECTOMY  X2 WITH RADIOACTIVE SEED X2  AND SENTINEL LYMPH NODE BIOPSY;  Surgeon: Jovita Kussmaul, MD;  Location: [facility];  Service: General;  Laterality: Left;  . BREAST LUMPECTOMY WITH RADIOACTIVE SEED LOCALIZATION Right 10/06/2019   Procedure: RIGHT BREAST LUMPECTOMY WITH RADIOACTIVE SEED LOCALIZATION;  Surgeon: Jovita Kussmaul, MD;  Location: [facility];  Service: General;  Laterality: Right;  . COLONOSCOPY    . DIAGNOSTIC LAPAROSCOPY     cysts  . DILATATION & CURETTAGE/HYSTEROSCOPY WITH TRUECLEAR N/A 10/31/2013   Procedure: DILATATION &  CURETTAGE/HYSTEROSCOPY WITH TRUCLEAR;  Surgeon: Marylynn Pearson, MD;  Location: [facility];  Service: Gynecology;  Laterality: N/A;  . DILATION AND CURETTAGE OF UTERUS    . MASS EXCISION Left 11/06/2019   Procedure: EXCISION NIPPLE AND AREOLA  MARGIN LEFT BREAST;  Surgeon: Jovita Kussmaul, MD;  Location: [facility];  Service: General;  Laterality: Left;  . TONSILLECTOMY  1952  . WISDOM TOOTH EXTRACTION      Social History   Socioeconomic History  . Marital status: Married    Spouse name: Not on file  . Number of children: 1  . Years of education: Not on file  . Highest education level: Not on file  Occupational History  . Not on file  Tobacco Use  . Smoking status: Former Smoker    Packs/day: 0.10    Years: 20.00    Pack years: 2.00    Types: Cigarettes    Quit date: 05/15/2007    Years since quitting: 12.5  . Smokeless tobacco: Never Used  Substance  and Sexual Activity  . Alcohol use: Yes    Alcohol/week: 14.0 standard drinks    Types: 14 Glasses of wine per week    Comment: 2 glasses wine every evening  . Drug use: No  . Sexual activity: Yes    Birth control/protection: Post-menopausal  Other Topics Concern  . Not on file  Social History Narrative   Lives with husband in 2 story house   Has one daughter, with 3 grandchildren, local, supportive   Enjoys gardening/walking outside with husband   Occasionally does home exercise calisthenics but not recently.   Well-balanced diet overall   Social Determinants of Health   Financial Resource Strain: Low Risk   . Difficulty of Paying Living Expenses: Not hard at all  Food Insecurity: No Food Insecurity  . Worried About Charity fundraiser in the Last Year: Never true  . Ran Out of Food in the Last Year: Never true  Transportation Needs: No Transportation Needs  . Lack of Transportation (Medical): No  . Lack of Transportation (Non-Medical): No  Physical Activity: Inactive  . Days of Exercise per Week: 0 days  . Minutes of Exercise per Session: 0 min  Stress: Stress Concern Present  . Feeling of Stress : Rather much  Social Connections: Unknown  . Frequency of Communication with Friends and Family: Three times a week  . Frequency of Social Gatherings with Friends and Family: Once a week  . Attends Religious Services: Not on file  . Active Member of Clubs or Organizations: Not on file  . Attends Archivist Meetings: Not on file  . Marital Status: Married     FAMILY HISTORY:  We obtained a detailed, 4-generation family history.  Significant diagnoses are listed below: Family History  Problem Relation Age of Onset  . Hyperlipidemia Mother   . Hypertension Mother   . Diabetes Mother        type ll  . Leukemia Mother 62  . Lung cancer Father   . Hyperlipidemia Father   . Breast cancer Cousin 30       maternal first couin  . Heart disease Other   . Hypertension  Other   . Cancer Other   . Depression Brother   . Breast cancer Maternal Aunt 27  . Uterine cancer Paternal Grandmother   . Breast cancer Maternal Aunt 48  . Lung cancer Maternal Grandfather   . Cancer Maternal Grandfather  gastric cancer?  . Cancer Maternal Uncle        unknown  . Brain cancer Paternal Uncle   . Throat cancer Paternal Uncle     The patient has one daughter who is cancer free.  She does not have siblings.  Both parents are deceased.  The patient's mother had leukemia and died at 36.  She ha two sisters who had breast cancer at age 65.  A third sister did not have cancer but had a daughter with breast cancer who died in her 55's.  There are three brothers, one who had an unknown cancer. Both maternal grandparents are deceased.  The grandfather had lung cancer.  The patient's father died of lung cancer.  He had a brother with brain cancer, a brother with throat cancer and three sisters who were cancer free.  The maternal grandmother had uterine cancer and died at 26.    Shelly [name] is unaware of previous family history of genetic testing for hereditary cancer risks. Patient's maternal ancestors are of Caucasian descent, and paternal ancestors are of English descent. There is no reported Ashkenazi Jewish ancestry. There is no known consanguinity.  GENETIC COUNSELING ASSESSMENT: Shelly [name] is a 74 y.o. female with a personal and family history of breast cancer which is somewhat suggestive of a hereditary cancer syndrome and predisposition to cancer given the number of women in the family with breast cancer, some at young ages. We, therefore, discussed and recommended the following at today's visit.   DISCUSSION: We discussed that 5 - 10% of breast cancer is hereditary, with most cases associated with BRCA mutations.  There are other genes that can be associated with hereditary breast cancer syndromes.  These include ATM, CHEK2 and PALB2.  We discussed that testing is  beneficial for several reasons including knowing how to follow individuals after completing their treatment, identifying whether potential treatment options such as PARP inhibitors would be beneficial, and understand if other family members could be at risk for cancer and allow them to undergo genetic testing.   We reviewed the characteristics, features and inheritance patterns of hereditary cancer syndromes. We also discussed genetic testing, including the appropriate family members to test, the process of testing, insurance coverage and turn-around-time for results. We discussed the implications of a negative, positive, carrier and/or variant of uncertain significant result. We recommended Shelly [name] pursue genetic testing for the common hereditary cancer gene panel. The Common Hereditary Gene Panel offered by [facility] includes sequencing and/or deletion duplication testing of the following 48 genes: APC, ATM, AXIN2, BARD1, BMPR1A, BRCA1, BRCA2, BRIP1, CDH1, CDK4, CDKN2A (p14ARF), CDKN2A (p16INK4a), CHEK2, CTNNA1, DICER1, EPCAM (Deletion/duplication testing only), GREM1 (promoter region deletion/duplication testing only), KIT, MEN1, MLH1, MSH2, MSH3, MSH6, MUTYH, NBN, NF1, NHTL1, PALB2, PDGFRA, PMS2, POLD1, POLE, PTEN, RAD50, RAD51C, RAD51D, RNF43, SDHB, SDHC, SDHD, SMAD4, SMARCA4. STK11, TP53, TSC1, TSC2, and VHL.  The following genes were evaluated for sequence changes only: SDHA and HOXB13 c.251G>A variant only.   Based on Shelly [name]'s personal and family history of cancer, she meets medical criteria for genetic testing. Despite that she meets criteria, she may still have an out of pocket cost. We discussed that if her out of pocket cost for testing is over $100, the laboratory will call and confirm whether she wants to proceed with testing.  If the out of pocket cost of testing is less than $100 she will be billed by the genetic testing laboratory.   PLAN: Despite our recommendation, Shelly [name] did  not wish  to pursue genetic testing at today's visit. We understand this decision and remain available to coordinate genetic testing at any time in the future. We, therefore, recommend Shelly [name] continue to follow the cancer screening guidelines given by her primary healthcare provider.  Lastly, we encouraged Shelly [name] to remain in contact with cancer genetics annually so that we can continuously update the family history and inform her of any changes in cancer genetics and testing that may be of benefit for this family.   Shelly [name] questions were answered to her satisfaction today. Our contact information was provided should additional questions or concerns arise. Thank you for the referral and allowing [place] to share in the care of your patient.   Judah Carchi P. Florene Glen, [place], [facility] Licensed, Insurance risk surveyor [email or web address] phone: [phone]  The patient was seen for a total of 45 minutes in face-to-face genetic counseling.  This patient was discussed with Drs. Magrinat, Lindi Adie and/or Burr Medico who agrees with the above.    _______________________________________________________________________ For Office Staff:  Number of people involved in session: 2 Was an Intern/ student involved with case: no

## 2019-12-01 ENCOUNTER — Other Ambulatory Visit: Payer: Self-pay

## 2019-12-01 ENCOUNTER — Telehealth: Payer: Self-pay | Admitting: *Deleted

## 2019-12-01 MED ORDER — CILIDINIUM-CHLORDIAZEPOXIDE 2.5-5 MG PO CAPS: 1.0000 | ORAL_CAPSULE | Freq: Three times a day (TID) | ORAL | 1 refills | Status: DC | PRN

## 2019-12-01 NOTE — Progress Notes (Signed)
Shelly [name]   [phone] [phone]   Clinic Follow up Note   Patient Care Team: Eulas Post, MD as PCP - General (Family Medicine) Barbaraann Cao, [place] as Referring Physician (Optometry) Jovita Kussmaul, MD as Consulting Physician (General Surgery) Mauro Kaufmann, RN as Oncology Nurse Navigator Rockwell Germany, RN as Oncology Nurse Navigator  Date of Service:  12/06/2019  CHIEF COMPLAINT: F/u of left breast cancer  SUMMARY OF ONCOLOGIC HISTORY: Oncology History Overview Note  Cancer Staging Cancer of central portion of left female breast [facility]) Staging form: Breast, AJCC 8th Edition - Clinical stage from 09/18/2019: Stage IA (cT1b, cN0, cM0, G2, ER+, PR+, HER2-) - Signed by Truitt Merle, MD on 09/18/2019 - Pathologic stage from 10/06/2019: Stage IA (pT1c, pN1a, cM0, G2, ER+, PR+, HER2-) - Signed by Truitt Merle, MD on 10/19/2019    Cancer of central portion of left female breast ([place])  08/24/2019 Mammogram   Diagnostic mammogram 08/24/19  IMPRESSION: 1. Left breast mass measuring 0.8 x 0.4 x 0.5 cm at the 1 o'clock retroareolar region is suspicious and likely corresponds to the area of distortion seen on mammography.   2. Left breast 3 o'clock retroareolar dilated duct with internal debris versus solid mass.   3. Right breast retroareolar focal asymmetry without sonographic correlate.   08/28/2019 Initial Biopsy   Final Diagnosis 08/28/19 1. Breast, left, needle core biopsy, 1 o'clock/retroareolar - INVASIVE DUCTAL CARCINOMA, GRADE II. - PERINEURAL INVASION. - SEE MICROSCOPIC DESCRIPTION. 2. Breast, left, needle core biopsy, 3 o'clock retroareolar - DUCTAL PAPILLOMA. - SEE MICROSCOPIC DESCRIPTION.   08/28/2019 Receptors her2   1. PROGNOSTIC INDICATORS 08/28/19 Results: IMMUNOHISTOCHEMICAL AND MORPHOMETRIC ANALYSIS PERFORMED MANUALLY The tumor cells are NEGATIVE for Her2 (0). Estrogen Receptor: 100%, POSITIVE, STRONG STAINING  INTENSITY Progesterone Receptor: 50%, POSITIVE, STRONG STAINING INTENSITY Proliferation Marker Ki67: 12%   09/01/2019 Pathology Results   Diagnosis 09/01/19 Breast, right, needle core biopsy, retroareolar - COMPLEX SCLEROSING LESION WITH A PAPILLARY SCLEROSING LESION AND USUAL DUCTAL HYPERPLASIA. SEE NOTE - NEGATIVE FOR CARCINOMA   09/18/2019 Initial Diagnosis   Cancer of central portion of left female breast ([place])   09/18/2019 Cancer Staging   Staging form: Breast, AJCC 8th Edition - Clinical stage from 09/18/2019: Stage IA (cT1b, cN0, cM0, G2, ER+, PR+, HER2-) - Signed by Truitt Merle, MD on 09/18/2019   10/06/2019 Surgery   LEFT BREAST LUMPECTOMY  X2 WITH RADIOACTIVE SEED X2  AND SENTINEL LYMPH NODE BIOPSY and  RIGHT BREAST LUMPECTOMY WITH RADIOACTIVE SEED LOCALIZATION by Dr Marlou Starks  10/06/19    10/06/2019 Pathology Results   FINAL MICROSCOPIC DIAGNOSIS: 10/06/19   A. BREAST, RIGHT, LUMPECTOMY:  - Fibrocystic change with a small incidental intraductal papilloma and  usual ductal hyperplasia  - Biopsy site changes  - Negative for carcinoma   B. LYMPH NODE, LEFT #1, SENTINEL, BIOPSY:  - Metastatic carcinoma to a lymph node (1/1)  - Focus of metastatic carcinoma measures 2.5 mm and shows extranodal  extension   C. LYMPH NODE, LEFT #2, SENTINEL, BIOPSY:  - Lymph node, negative for carcinoma (0/1)   D. LYMPH NODE, LEFT #3, SENTINEL, BIOPSY:  - Lymph node, negative for carcinoma (0/1)   E. BREAST, LEFT, LUMPECTOMY:  - Invasive ductal carcinoma, grade 2, 1.8 cm  - Ductal carcinoma in situ, intermediate grade  - Invasive carcinoma focally involves the anterior margin  - DCIS is less than 1 mm from the superior margin  - Background breast parenchyma with fibrocystic  change, usual ductal  hyperplasia and intraductal papilloma.  - Biopsy site changes  - See oncology table    10/06/2019 Miscellaneous   Her Mammaprint showed High Risk Luminal Type B, risk of recurrence in the next  10 years is 29% in high risk disease. MPI -0.335    10/06/2019 Cancer Staging   Staging form: Breast, AJCC 8th Edition - Pathologic stage from 10/06/2019: Stage IA (pT1c, pN1a, cM0, G2, ER+, PR+, HER2-) - Signed by Truitt Merle, MD on 10/19/2019   11/06/2019 Surgery   EXCISION NIPPLE AND AREOLA  MARGIN LEFT BREAST by Dr Marlou Starks  11/06/19    11/06/2019 Pathology Results    FINAL MICROSCOPIC DIAGNOSIS: 11/06/19  A. BREAST, LEFT CENTRAL, RE-EXCISION:  -  Residual ductal carcinoma in situ, intermediate grade  -  Margins uninvolved by carcinoma (0.4 cm; superior margin)  -  Atypical intraductal papilloma  -  Usual ductal hyperplasia  -  Benign skin with seborrheic keratosis  -  Previous surgical site changes  -  No invasive carcinoma identified    12/06/2019 -  Chemotherapy   Adjuvant chemo TC q3weeks for 4 cycles starting 12/06/19      CURRENT THERAPY:  Adjuvant chemo TC q3weeks for 4 cyclesstarting 12/06/19  INTERVAL HISTORY:  Shelly [name] is here for a follow up and treatment. She presents to the clinic alone. She notes she is nervous today and did not sleep well last night. She notes she had first COVID vaccine yesterday and feels flushed today. She notes she is having issues contacting her PCP for refills. She wants a refill for Librax for diarrhea with her usual manufacturer. She takes this at breakfast when needed. Her PCP refilled with another manufacturer which she feels does not work well.    REVIEW OF SYSTEMS:   Constitutional: Denies fevers, chills or abnormal weight loss Eyes: Denies blurriness of vision Ears, nose, mouth, throat, and face: Denies mucositis or sore throat Respiratory: Denies cough, dyspnea or wheezes Cardiovascular: Denies palpitation, chest discomfort or lower extremity swelling Gastrointestinal:  Denies nausea, heartburn or change in bowel habits Skin: Denies abnormal skin rashes Lymphatics: Denies new lymphadenopathy or easy  bruising Neurological:Denies numbness, tingling or new weaknesses Behavioral/Psych: Mood is stable, no new changes  All other systems were reviewed with the patient and are negative.  MEDICAL HISTORY:  Past Medical History:  Diagnosis Date  . Arthritis    wrist, shoulder   . Cancer ([place]) 08/2019   left breast IDC  . Diverticulitis   . Endometrial polyp   . Family history of breast cancer   . GERD (gastroesophageal reflux disease)    occasional - diet controlled  . Hx of adenomatous colonic polyps   . Hyperlipidemia   . IBS (irritable bowel syndrome)   . Missed abortion    x 1 - resolved - no surgery  . Osteoporosis   . SVD (spontaneous vaginal delivery)    x 1  . Transient vision disturbance 2019   was evaluated by ED, no findings  . Vitamin D deficiency     SURGICAL HISTORY: Past Surgical History:  Procedure Laterality Date  . BREAST LUMPECTOMY WITH RADIOACTIVE SEED AND SENTINEL LYMPH NODE BIOPSY Left 10/06/2019   Procedure: LEFT BREAST LUMPECTOMY  X2 WITH RADIOACTIVE SEED X2  AND SENTINEL LYMPH NODE BIOPSY;  Surgeon: Jovita Kussmaul, MD;  Location: [place];  Service: General;  Laterality: Left;  . BREAST LUMPECTOMY WITH RADIOACTIVE SEED LOCALIZATION Right 10/06/2019   Procedure: RIGHT  BREAST LUMPECTOMY WITH RADIOACTIVE SEED LOCALIZATION;  Surgeon: Jovita Kussmaul, MD;  Location: [place];  Service: General;  Laterality: Right;  . COLONOSCOPY    . DIAGNOSTIC LAPAROSCOPY     cysts  . DILATATION & CURETTAGE/HYSTEROSCOPY WITH TRUECLEAR N/A 10/31/2013   Procedure: DILATATION & CURETTAGE/HYSTEROSCOPY WITH TRUCLEAR;  Surgeon: Marylynn Pearson, MD;  Location: [facility];  Service: Gynecology;  Laterality: N/A;  . DILATION AND CURETTAGE OF UTERUS    . MASS EXCISION Left 11/06/2019   Procedure: EXCISION NIPPLE AND AREOLA  MARGIN LEFT BREAST;  Surgeon: Jovita Kussmaul, MD;  Location: [place];  Service: General;  Laterality: Left;  .  TONSILLECTOMY  1952  . WISDOM TOOTH EXTRACTION      I have reviewed the social history and family history with the patient and they are unchanged from previous note.  ALLERGIES:  is allergic to crestor [rosuvastatin calcium]; lipitor [atorvastatin calcium]; nsaids; pravastatin; and sulfonamide derivatives.  MEDICATIONS:  Current Outpatient Medications  Medication Sig Dispense Refill  . acetaminophen (TYLENOL) 500 MG tablet Take 500 mg by mouth every 6 (six) hours as needed for headache.    Marland Kitchen acetaminophen-codeine (TYLENOL #3) 300-30 MG tablet Take 1 tablet by mouth every 4 (four) hours as needed for moderate pain. (Patient taking differently: Take 1 tablet by mouth every 4 (four) hours as needed for moderate pain. ) 15 tablet 0  . bismuth subsalicylate (PEPTO BISMOL) 262 MG/15ML suspension Take 30 mLs by mouth every 6 (six) hours as needed.    . clidinium-chlordiazePOXIDE (LIBRAX) 5-2.5 MG capsule Take 1 capsule by mouth every 8 (eight) hours as needed (bowels). 30 capsule 0  . denosumab (PROLIA) 60 MG/ML SOSY injection Inject 60 mg into the skin every 6 (six) months.    . dexamethasone (DECADRON) 4 MG tablet Take 1 tablet (4 mg total) by mouth 2 (two) times daily. Start the day before Taxotere. Then 1 tab daily the day after chemo for 3 days. 30 tablet 1  . esomeprazole (NEXIUM) 20 MG capsule Take 20 mg by mouth daily as needed (acid reflux).     Marland Kitchen HYDROcodone-acetaminophen (NORCO/VICODIN) 5-325 MG tablet Take 1-2 tablets by mouth every 6 (six) hours as needed for moderate pain or severe pain. 10 tablet 0  . loratadine (CLARITIN) 10 MG tablet Take 10 mg by mouth daily as needed for allergies.     Marland Kitchen LORazepam (ATIVAN) 0.5 MG tablet Take 1 tablet (0.5 mg total) by mouth every 8 (eight) hours as needed for anxiety. (Patient taking differently: Take 0.5 mg by mouth every 8 (eight) hours as needed for anxiety. ) 20 tablet 0  . ondansetron (ZOFRAN) 8 MG tablet Take 1 tablet (8 mg total) by mouth 2  (two) times daily as needed for refractory nausea / vomiting. Start on day 3 after chemo. 30 tablet 1  . prochlorperazine (COMPAZINE) 10 MG tablet Take 1 tablet (10 mg total) by mouth every 6 (six) hours as needed (Nausea or vomiting). 30 tablet 1  . traMADol (ULTRAM) 50 MG tablet Take 1-2 tablets (50-100 mg total) by mouth every 6 (six) hours as needed. 20 tablet 0  . traMADol (ULTRAM) 50 MG tablet Take 1-2 tablets (50-100 mg total) by mouth every 6 (six) hours as needed. 20 tablet 0   No current facility-administered medications for this visit.   Facility-Administered Medications Ordered in Other Visits  Medication Dose Route Frequency Provider Last Rate Last Admin  . cyclophosphamide (CYTOXAN) 960 mg in sodium  chloride 0.9 % 250 mL chemo infusion  500 mg/m2 (Treatment Plan Recorded) Intravenous Once Truitt Merle, MD      . EPINEPHrine (EPI-PEN) injection 0.3 mg  0.3 mg Intramuscular Once PRN Truitt Merle, MD   0.3 mg at 12/06/19 1245    PHYSICAL EXAMINATION: ECOG PERFORMANCE STATUS: 1 - Symptomatic but completely ambulatory  Vitals:   12/06/19 1030  BP: (!) 157/70  Pulse: 77  Resp: 20  Temp: 98 F (36.7 C)  SpO2: 97%   Filed Weights   12/06/19 1030  Weight: 180 lb 9.6 oz (81.9 kg)    Due to COVID19 we will limit examination to appearance. Patient had no complaints.  GENERAL:alert, no distress and comfortable SKIN: skin color normal, no rashes or significant lesions EYES: normal, Conjunctiva are pink and non-injected, sclera clear  NEURO: alert & oriented x 3 with fluent speech   LABORATORY DATA:  I have reviewed the data as listed CBC Latest Ref Rng & Units 12/06/2019 11/17/2018 08/21/2014  WBC 4.0 - 10.5 K/uL 13.2(H) 6.9 7.0  Hemoglobin 12.0 - 15.0 g/dL 13.9 14.7 13.4  Hematocrit 36.0 - 46.0 % 42.8 47.0(H) 40.0  Platelets 150 - 400 K/uL 335 308 337.0     CMP Latest Ref Rng & Units 12/06/2019 11/17/2018 01/07/2018  Glucose 70 - 99 mg/dL 113(H) 105(H) 113(H)  BUN 8 - 23 mg/dL 16  13 22   Creatinine 0.44 - 1.00 mg/dL 0.82 0.84 0.88  Sodium 135 - 145 mmol/L 141 140 140  Potassium 3.5 - 5.1 mmol/L 4.1 4.1 4.3  Chloride 98 - 111 mmol/L 109 105 105  CO2 22 - 32 mmol/L 23 24 29   Calcium 8.9 - 10.3 mg/dL 9.6 9.3 9.4  Total Protein 6.5 - 8.1 g/dL 7.3 7.3 -  Total Bilirubin 0.3 - 1.2 mg/dL 0.3 0.2(L) -  Alkaline Phos 38 - 126 U/L 78 64 -  AST 15 - 41 U/L 11(L) 18 -  ALT 0 - 44 U/L 17 19 -      RADIOGRAPHIC STUDIES: I have personally reviewed the radiological images as listed and agreed with the findings in the report. No results found.   ASSESSMENT & PLAN:  Shelly [name] is a 74 y.o. female with    1.Cancer of central portion of left female breast, StageIA, c(T1bN0M0), ER/PR+, HER2-, GradeII, pT1cN1aM0, stage IA  -She was diagnosed in 08/2019. She underwent B/l breast lumpectomy and SLNB with Dr. Marlou Starks on 10/06/19. Due to positive margins and 1/3 positive LNs she underwent re-excisional surgery on 11/06/19 which showed DCIS only, margins negative. She has high risk for recurrence based on Mammaprint.  -To reduce her risk of recurrence I recommend adjuvant chemo with TC q3weeks for 4 cycles starting today (12/06/19). Given her advanced age, will reduce chemo dose by 10-15% for first cycle. She declined Dignicap.  -I reviewed the potential side effect with patient again today, lab reviewed, adequate for treatment, seated for cycle today. -She will proceed with adjuvant RT and antiestrogen therapy after chemo to further reduce her risk of local and distant recurrence.  -Given insomnia will reduce oral Dexa to one tab daily the day before infusion and for 3 days after infusion in the AM. I encouraged her to use benadryl or melatonin at night to help her sleep. She can use Claritin for 5 days after GCSF injection.  -She did receive first dose of COVID19 vaccine yesterday (12/04/18).  -F/u next week for toxicity check with NP Lacie    2. Genetic  Testing  -She has 3  maternal family members who had breast cancer and overall significant family history of cancer.She was seen by genetics on 11/28/18 and she did not want to pursue genetic testing.    3. Osteoporosis  -Diagnosed prior to 2007 in [place] in 06/2015 (-3.2 at [facility]) -On Prolia injections every 6 months since 2017-2018 -I wouldrecommend she have updated baseline DEXA before start of antiestrogen therapy.   4. Diarrhea and Acid Reflux  -She has been on Librax and Nexium. I encouraged her to continue Nexium BID as needed.  -She can use OTC and Imodium and Librax. If not enough I will call in Lomotil. I refilled Librax today.    5. Anxiety, Trouble sleeping  -She is nervous about her cancer diagnosis and start of chemo.  -She did have trouble sleeping, etiology likely multifactorial. She is on oral dexa. Will reduce oral dexa to one tab daily the day before infusion and for 3 days after infusion.  -She is also on Ativan. She can use benadryl and Melatonin to help her sleep if needed.    PLAN: -I refilled Librax today  -Labs reviewed and adequate to proceed with C1 TC today with GCSF on day 3  -Lab and F/u next week for toxicity check.    No problem-specific Assessment & Plan notes found for this encounter.   No orders of the defined types were placed in this encounter.  All questions were answered. The patient knows to call the clinic with any problems, questions or concerns. No barriers to learning was detected. The total time spent in the appointment was 40 minutes.     Truitt Merle, MD 12/06/2019   I, Joslyn Devon, am acting as scribe for Truitt Merle, MD.   I have reviewed the above documentation for accuracy and completeness, and I agree with the above.    Addendum  Pt developed diffuse skin erythema and hot flushes after docetaxel was started 5 minutes later.  She also complained of strange feeling, and some throat tightness.  I saw her in the infusion room,  vital signs showed hypertension, oxygen level has been normal in 97-98% on RA. No wheezing on exam.  Chemo infusion was paused, she was given Benadryl, Pepcid, Solu-Medrol, and epinephrine injection, and IV fluids.  She remained to be awake, alert during the reaction.  Symptoms resolved quickly. Will cancel chemo today, I recommend her to use Benadryl at home today.  I will call her and her husband in a few days to discuss the next step.   Truitt Merle  12/06/19

## 2019-12-01 NOTE — Telephone Encounter (Signed)
Please see telephone message for this medication.

## 2019-12-01 NOTE — Telephone Encounter (Signed)
Copied from [place] [id number] [id number]. Topic: General - Other >> Dec 01, 2019  8:36 AM Leward Quan A wrote: Reason for CRM: Patient called to say that she would like an Rx  for clidinium-chlordiazePOXIDE (LIBRAX) 5-2.5 MG capsule because the last time she received the medication it was from a new manufacturer and it is not working the same. She states that the pharmacy need a new Rx so that they can order from old manufacturer. Please advise Ph# [phone] or  [phone]

## 2019-12-01 NOTE — Telephone Encounter (Signed)
Medication sent pending to you. Please see this message.

## 2019-12-01 NOTE — Telephone Encounter (Signed)
Already addressed

## 2019-12-05 ENCOUNTER — Other Ambulatory Visit: Payer: Self-pay | Admitting: *Deleted

## 2019-12-05 DIAGNOSIS — C50112 Malignant neoplasm of central portion of left female breast: Secondary | ICD-10-CM

## 2019-12-05 DIAGNOSIS — Z170 Estrogen receptor positive status [ER+]: Secondary | ICD-10-CM

## 2019-12-06 ENCOUNTER — Inpatient Hospital Stay (HOSPITAL_BASED_OUTPATIENT_CLINIC_OR_DEPARTMENT_OTHER): Payer: Medicare Other | Admitting: Medical

## 2019-12-06 ENCOUNTER — Inpatient Hospital Stay: Payer: Medicare Other

## 2019-12-06 ENCOUNTER — Encounter: Payer: Self-pay | Admitting: *Deleted

## 2019-12-06 ENCOUNTER — Other Ambulatory Visit: Payer: Self-pay

## 2019-12-06 ENCOUNTER — Encounter: Payer: Self-pay | Admitting: Hematology

## 2019-12-06 ENCOUNTER — Inpatient Hospital Stay (HOSPITAL_BASED_OUTPATIENT_CLINIC_OR_DEPARTMENT_OTHER): Payer: Medicare Other | Admitting: Hematology

## 2019-12-06 VITALS — BP 155/67 | HR 77 | Temp 99.0°F | Resp 18

## 2019-12-06 VITALS — BP 157/70 | HR 77 | Temp 98.0°F | Resp 20 | Ht 66.0 in | Wt 180.6 lb

## 2019-12-06 DIAGNOSIS — C50112 Malignant neoplasm of central portion of left female breast: Secondary | ICD-10-CM

## 2019-12-06 DIAGNOSIS — K219 Gastro-esophageal reflux disease without esophagitis: Secondary | ICD-10-CM | POA: Diagnosis not present

## 2019-12-06 DIAGNOSIS — T8090XA Unspecified complication following infusion and therapeutic injection, initial encounter: Secondary | ICD-10-CM

## 2019-12-06 DIAGNOSIS — Z170 Estrogen receptor positive status [ER+]: Secondary | ICD-10-CM

## 2019-12-06 DIAGNOSIS — Z5111 Encounter for antineoplastic chemotherapy: Secondary | ICD-10-CM | POA: Diagnosis not present

## 2019-12-06 DIAGNOSIS — G4700 Insomnia, unspecified: Secondary | ICD-10-CM | POA: Diagnosis not present

## 2019-12-06 DIAGNOSIS — M810 Age-related osteoporosis without current pathological fracture: Secondary | ICD-10-CM | POA: Diagnosis not present

## 2019-12-06 LAB — CMP (CANCER CENTER ONLY)
ALT: 17 U/L (ref 0–44)
AST: 11 U/L — ABNORMAL LOW (ref 15–41)
Albumin: 4.1 g/dL (ref 3.5–5.0)
Alkaline Phosphatase: 78 U/L (ref 38–126)
Anion gap: 9 (ref 5–15)
BUN: 16 mg/dL (ref 8–23)
CO2: 23 mmol/L (ref 22–32)
Calcium: 9.6 mg/dL (ref 8.9–10.3)
Chloride: 109 mmol/L (ref 98–111)
Creatinine: 0.82 mg/dL (ref 0.44–1.00)
GFR, Est AFR Am: >60 mL/min (ref >60)
GFR, Estimated: >60 mL/min (ref >60)
Glucose, Bld: 113 mg/dL — ABNORMAL HIGH (ref 70–99)
Potassium: 4.1 mmol/L (ref 3.5–5.1)
Sodium: 141 mmol/L (ref 135–145)
Total Bilirubin: 0.3 mg/dL (ref 0.3–1.2)
Total Protein: 7.3 g/dL (ref 6.5–8.1)

## 2019-12-06 LAB — CBC WITH DIFFERENTIAL (CANCER CENTER ONLY)
Abs Immature Granulocytes: 0.05 10*3/uL (ref 0.00–0.07)
Basophils Absolute: 0 10*3/uL (ref 0.0–0.1)
Basophils Relative: 0 %
Eosinophils Absolute: 0 10*3/uL (ref 0.0–0.5)
Eosinophils Relative: 0 %
HCT: 42.8 % (ref 36.0–46.0)
Hemoglobin: 13.9 g/dL (ref 12.0–15.0)
Immature Granulocytes: 0 %
Lymphocytes Relative: 13 %
Lymphs Abs: 1.7 10*3/uL (ref 0.7–4.0)
MCH: 28.5 pg (ref 26.0–34.0)
MCHC: 32.5 g/dL (ref 30.0–36.0)
MCV: 87.9 fL (ref 80.0–100.0)
Monocytes Absolute: 1 10*3/uL (ref 0.1–1.0)
Monocytes Relative: 8 %
Neutro Abs: 10.4 10*3/uL — ABNORMAL HIGH (ref 1.7–7.7)
Neutrophils Relative %: 79 %
Platelet Count: 335 10*3/uL (ref 150–400)
RBC: 4.87 MIL/uL (ref 3.87–5.11)
RDW: 14.1 % (ref 11.5–15.5)
WBC Count: 13.2 10*3/uL — ABNORMAL HIGH (ref 4.0–10.5)
nRBC: 0 % (ref 0.0–0.2)

## 2019-12-06 MED ORDER — DIPHENHYDRAMINE HCL 50 MG/ML IJ SOLN
50.0000 mg | Freq: Once | INTRAMUSCULAR | Status: AC | PRN
  Administered 2019-12-06: 13:00:00 50 mg via INTRAVENOUS

## 2019-12-06 MED ORDER — PALONOSETRON HCL INJECTION 0.25 MG/5ML
INTRAVENOUS | Status: AC
  Filled 2019-12-06: qty 5

## 2019-12-06 MED ORDER — SODIUM CHLORIDE 0.9% FLUSH
10.0000 mL | INTRAVENOUS | Status: DC | PRN
  Filled 2019-12-06: qty 10

## 2019-12-06 MED ORDER — FAMOTIDINE IN NACL 20-0.9 MG/50ML-% IV SOLN
20.0000 mg | Freq: Once | INTRAVENOUS | Status: AC | PRN
  Administered 2019-12-06: 13:00:00 20 mg via INTRAVENOUS

## 2019-12-06 MED ORDER — ACETAMINOPHEN 500 MG PO TABS
ORAL_TABLET | ORAL | Status: AC
  Filled 2019-12-06: qty 2

## 2019-12-06 MED ORDER — METHYLPREDNISOLONE SODIUM SUCC 125 MG IJ SOLR
125.0000 mg | Freq: Once | INTRAMUSCULAR | Status: AC | PRN
  Administered 2019-12-06: 12:00:00 125 mg via INTRAVENOUS

## 2019-12-06 MED ORDER — DEXAMETHASONE SODIUM PHOSPHATE 10 MG/ML IJ SOLN
INTRAMUSCULAR | Status: AC
  Filled 2019-12-06: qty 1

## 2019-12-06 MED ORDER — DEXAMETHASONE SODIUM PHOSPHATE 10 MG/ML IJ SOLN
10.0000 mg | Freq: Once | INTRAMUSCULAR | Status: AC
  Administered 2019-12-06: 12:00:00 10 mg via INTRAVENOUS

## 2019-12-06 MED ORDER — SODIUM CHLORIDE 0.9 % IV SOLN
Freq: Once | INTRAVENOUS | Status: AC
  Filled 2019-12-06: qty 250

## 2019-12-06 MED ORDER — SODIUM CHLORIDE 0.9 % IV SOLN
65.0000 mg/m2 | Freq: Once | INTRAVENOUS | Status: AC
  Administered 2019-12-06: 12:00:00 120 mg via INTRAVENOUS
  Filled 2019-12-06: qty 12

## 2019-12-06 MED ORDER — ACETAMINOPHEN 500 MG PO TABS
1000.0000 mg | ORAL_TABLET | Freq: Once | ORAL | Status: AC
  Administered 2019-12-06: 13:00:00 1000 mg via ORAL

## 2019-12-06 MED ORDER — CILIDINIUM-CHLORDIAZEPOXIDE 2.5-5 MG PO CAPS: 1.0000 | ORAL_CAPSULE | Freq: Three times a day (TID) | ORAL | 0 refills | Status: DC | PRN

## 2019-12-06 MED ORDER — SODIUM CHLORIDE 0.9 % IV SOLN: 10.0000 mg | Freq: Once | INTRAVENOUS | Status: DC

## 2019-12-06 MED ORDER — EPINEPHRINE 0.3 MG/0.3ML IJ SOAJ
0.3000 mg | Freq: Once | INTRAMUSCULAR | Status: DC | PRN
  Administered 2019-12-06: 13:00:00 0.3 mg via INTRAMUSCULAR
  Filled 2019-12-06: qty 0.3

## 2019-12-06 MED ORDER — SODIUM CHLORIDE 0.9 % IV SOLN
500.0000 mg/m2 | Freq: Once | INTRAVENOUS | Status: DC
  Filled 2019-12-06: qty 48

## 2019-12-06 MED ORDER — PALONOSETRON HCL INJECTION 0.25 MG/5ML
0.2500 mg | Freq: Once | INTRAVENOUS | Status: AC
  Administered 2019-12-06: 12:00:00 0.25 mg via INTRAVENOUS

## 2019-12-06 NOTE — Patient Instructions (Signed)
Docetaxel injection What is this medicine? DOCETAXEL (doe se TAX el) is a chemotherapy drug. It targets fast dividing cells, like cancer cells, and causes these cells to die. This medicine is used to treat many types of cancers like breast cancer, certain stomach cancers, head and neck cancer, lung cancer, and prostate cancer. This medicine may be used for other purposes; ask your health care provider or pharmacist if you have questions. COMMON BRAND NAME(S): Docefrez, Taxotere What should I tell my health care provider before I take this medicine? They need to know if you have any of these conditions:  infection (especially a virus infection such as chickenpox, cold sores, or herpes)  liver disease  low blood counts, like low white cell, platelet, or red cell counts  an unusual or allergic reaction to docetaxel, polysorbate 80, other chemotherapy agents, other medicines, foods, dyes, or preservatives  pregnant or trying to get pregnant  breast-feeding How should I use this medicine? This drug is given as an infusion into a vein. It is administered in a hospital or clinic by a specially trained health care professional. Talk to your pediatrician regarding the use of this medicine in children. Special care may be needed. Overdosage: If you think you have taken too much of this medicine contact a poison control center or emergency room at once. NOTE: This medicine is only for you. Do not share this medicine with others. What if I miss a dose? It is important not to miss your dose. Call your doctor or health care professional if you are unable to keep an appointment. What may interact with this medicine?  aprepitant  certain antibiotics like erythromycin or clarithromycin  certain antivirals for HIV or hepatitis  certain medicines for fungal infections like fluconazole, itraconazole, ketoconazole, posaconazole, or  voriconazole  cimetidine  ciprofloxacin  conivaptan  cyclosporine  dronedarone  fluvoxamine  grapefruit juice  imatinib  verapamil This list may not describe all possible interactions. Give your health care provider a list of all the medicines, herbs, non-prescription drugs, or dietary supplements you use. Also tell them if you smoke, drink alcohol, or use illegal drugs. Some items may interact with your medicine. What should I watch for while using this medicine? Your condition will be monitored carefully while you are receiving this medicine. You will need important blood work done while you are taking this medicine. Call your doctor or health care professional for advice if you get a fever, chills or sore throat, or other symptoms of a cold or flu. Do not treat yourself. This drug decreases your body's ability to fight infections. Try to avoid being around people who are sick. Some products may contain alcohol. Ask your health care professional if this medicine contains alcohol. Be sure to tell all health care professionals you are taking this medicine. Certain medicines, like metronidazole and disulfiram, can cause an unpleasant reaction when taken with alcohol. The reaction includes flushing, headache, nausea, vomiting, sweating, and increased thirst. The reaction can last from 30 minutes to several hours. You may get drowsy or dizzy. Do not drive, use machinery, or do anything that needs mental alertness until you know how this medicine affects you. Do not stand or sit up quickly, especially if you are an older patient. This reduces the risk of dizzy or fainting spells. Alcohol may interfere with the effect of this medicine. Talk to your health care professional about your risk of cancer. You may be more at risk for certain types of cancer if   you take this medicine. Do not become pregnant while taking this medicine or for 6 months after stopping it. Women should inform their doctor if  they wish to become pregnant or think they might be pregnant. There is a potential for serious side effects to an unborn child. Talk to your health care professional or pharmacist for more information. Do not breast-feed an infant while taking this medicine or for 1 week after stopping it. Males who get this medicine must use a condom during sex with females who can get pregnant. If you get a woman pregnant, the baby could have birth defects. The baby could die before they are born. You will need to continue wearing a condom for 3 months after stopping the medicine. Tell your health care provider right away if your partner becomes pregnant while you are taking this medicine. This may interfere with the ability to father a child. You should talk to your doctor or health care professional if you are concerned about your fertility. What side effects may I notice from receiving this medicine? Side effects that you should report to your doctor or health care professional as soon as possible:  allergic reactions like skin rash, itching or hives, swelling of the face, lips, or tongue  blurred vision  breathing problems  changes in vision  low blood counts - This drug may decrease the number of white blood cells, red blood cells and platelets. You may be at increased risk for infections and bleeding.  nausea and vomiting  pain, redness or irritation at site where injected  pain, tingling, numbness in the hands or feet  redness, blistering, peeling, or loosening of the skin, including inside the mouth  signs of decreased platelets or bleeding - bruising, pinpoint red spots on the skin, black, tarry stools, nosebleeds  signs of decreased red blood cells - unusually weak or tired, fainting spells, lightheadedness  signs of infection - fever or chills, cough, sore throat, pain or difficulty passing urine  swelling of the ankle, feet, hands Side effects that usually do not require medical attention  (report to your doctor or health care professional if they continue or are bothersome):  constipation  diarrhea  fingernail or toenail changes  hair loss  loss of appetite  mouth sores  muscle pain This list may not describe all possible side effects. Call your doctor for medical advice about side effects. You may report side effects to FDA at 1-800-FDA-1088. Where should I keep my medicine? This drug is given in a hospital or clinic and will not be stored at home. NOTE: This sheet is a summary. It may not cover all possible information. If you have questions about this medicine, talk to your doctor, pharmacist, or health care provider.  2020 Elsevier/Gold Standard (2019-06-29 10:19:06)  

## 2019-12-06 NOTE — Progress Notes (Signed)
Approximately five minutes after first infusion of docetaxel began, patient reported increasingly hot, redness, and "strangle feeling." Infusion stopped and liter of normal saline started. Sandi Mealy, PA to chairside. Solumedrol, pepcid, benadryl, tylenol, and epinephrine given. Refer to [facility] for times. Dr. Burr Medico also to chairside and medication not being continued today. Verbal orders given for additional normal saline to be given and patient to be observed. Post fluids and observation, patient feeling much improved and vitals stable. Patient sent home with husband.

## 2019-12-07 ENCOUNTER — Telehealth: Payer: Self-pay | Admitting: Hematology

## 2019-12-07 ENCOUNTER — Telehealth: Payer: Self-pay

## 2019-12-07 NOTE — Telephone Encounter (Signed)
Patient calls stating she had her first treatment yesterday however had allergic reaction (rash) and treatment was stopped.  She is asking what her next treatment will be whether it will be the same medication or a different one?  [phone]

## 2019-12-07 NOTE — Progress Notes (Signed)
[place]   Telephone:([phone] Fax:(336) [phone]   Clinic Follow up Note   Patient Care Team: Eulas Post, MD as PCP - General (Family Medicine) Barbaraann Cao, [place] as Referring Physician (Optometry) Jovita Kussmaul, MD as Consulting Physician (General Surgery) Mauro Kaufmann, RN as Oncology Nurse Navigator Rockwell Germany, RN as Oncology Nurse Navigator   I connected with Shelly [name] on 12/12/2019 at 10:20 AM EST by video enabled telemedicine visit and verified that I am speaking with the correct person using two identifiers.  I discussed the limitations, risks, security and privacy concerns of performing an evaluation and management service by telephone and the availability of in person appointments. I also discussed with the patient that there may be a patient responsible charge related to this service. The patient expressed understanding and agreed to proceed.   Other persons participating in the visit and their role in the encounter:  Her husband  Patient's location:  Her home  Provider's location:  My Office   CHIEF COMPLAINT: F/u of left breast cancer  SUMMARY OF ONCOLOGIC HISTORY: Oncology History Overview Note  Cancer Staging Cancer of central portion of left female breast [facility]) Staging form: Breast, AJCC 8th Edition - Clinical stage from 09/18/2019: Stage IA (cT1b, cN0, cM0, G2, ER+, PR+, HER2-) - Signed by Truitt Merle, MD on 09/18/2019 - Pathologic stage from 10/06/2019: Stage IA (pT1c, pN1a, cM0, G2, ER+, PR+, HER2-) - Signed by Truitt Merle, MD on 10/19/2019    Cancer of central portion of left female breast ([place])  08/24/2019 Mammogram   Diagnostic mammogram 08/24/19  IMPRESSION: 1. Left breast mass measuring 0.8 x 0.4 x 0.5 cm at the 1 o'clock retroareolar region is suspicious and likely corresponds to the area of distortion seen on mammography.   2. Left breast 3 o'clock retroareolar dilated duct with internal debris versus  solid mass.   3. Right breast retroareolar focal asymmetry without sonographic correlate.   08/28/2019 Initial Biopsy   Final Diagnosis 08/28/19 1. Breast, left, needle core biopsy, 1 o'clock/retroareolar - INVASIVE DUCTAL CARCINOMA, GRADE II. - PERINEURAL INVASION. - SEE MICROSCOPIC DESCRIPTION. 2. Breast, left, needle core biopsy, 3 o'clock retroareolar - DUCTAL PAPILLOMA. - SEE MICROSCOPIC DESCRIPTION.   08/28/2019 Receptors her2   1. PROGNOSTIC INDICATORS 08/28/19 Results: IMMUNOHISTOCHEMICAL AND MORPHOMETRIC ANALYSIS PERFORMED MANUALLY The tumor cells are NEGATIVE for Her2 (0). Estrogen Receptor: 100%, POSITIVE, STRONG STAINING INTENSITY Progesterone Receptor: 50%, POSITIVE, STRONG STAINING INTENSITY Proliferation Marker Ki67: 12%   09/01/2019 Pathology Results   Diagnosis 09/01/19 Breast, right, needle core biopsy, retroareolar - COMPLEX SCLEROSING LESION WITH A PAPILLARY SCLEROSING LESION AND USUAL DUCTAL HYPERPLASIA. SEE NOTE - NEGATIVE FOR CARCINOMA   09/18/2019 Initial Diagnosis   Cancer of central portion of left female breast ([place])   09/18/2019 Cancer Staging   Staging form: Breast, AJCC 8th Edition - Clinical stage from 09/18/2019: Stage IA (cT1b, cN0, cM0, G2, ER+, PR+, HER2-) - Signed by Truitt Merle, MD on 09/18/2019   10/06/2019 Surgery   LEFT BREAST LUMPECTOMY  X2 WITH RADIOACTIVE SEED X2  AND SENTINEL LYMPH NODE BIOPSY and  RIGHT BREAST LUMPECTOMY WITH RADIOACTIVE SEED LOCALIZATION by Dr Marlou Starks  10/06/19    10/06/2019 Pathology Results   FINAL MICROSCOPIC DIAGNOSIS: 10/06/19   A. BREAST, RIGHT, LUMPECTOMY:  - Fibrocystic change with a small incidental intraductal papilloma and  usual ductal hyperplasia  - Biopsy site changes  - Negative for carcinoma   B. LYMPH NODE, LEFT #1, SENTINEL, BIOPSY:  -  Metastatic carcinoma to a lymph node (1/1)  - Focus of metastatic carcinoma measures 2.5 mm and shows extranodal  extension   C. LYMPH NODE, LEFT #2,  SENTINEL, BIOPSY:  - Lymph node, negative for carcinoma (0/1)   D. LYMPH NODE, LEFT #3, SENTINEL, BIOPSY:  - Lymph node, negative for carcinoma (0/1)   E. BREAST, LEFT, LUMPECTOMY:  - Invasive ductal carcinoma, grade 2, 1.8 cm  - Ductal carcinoma in situ, intermediate grade  - Invasive carcinoma focally involves the anterior margin  - DCIS is less than 1 mm from the superior margin  - Background breast parenchyma with fibrocystic change, usual ductal  hyperplasia and intraductal papilloma.  - Biopsy site changes  - See oncology table    10/06/2019 Miscellaneous   Her Mammaprint showed High Risk Luminal Type B, risk of recurrence in the next 10 years is 29% in high risk disease. MPI -0.335    10/06/2019 Cancer Staging   Staging form: Breast, AJCC 8th Edition - Pathologic stage from 10/06/2019: Stage IA (pT1c, pN1a, cM0, G2, ER+, PR+, HER2-) - Signed by Truitt Merle, MD on 10/19/2019   11/06/2019 Surgery   EXCISION NIPPLE AND AREOLA  MARGIN LEFT BREAST by Dr Marlou Starks  11/06/19    11/06/2019 Pathology Results    FINAL MICROSCOPIC DIAGNOSIS: 11/06/19  A. BREAST, LEFT CENTRAL, RE-EXCISION:  -  Residual ductal carcinoma in situ, intermediate grade  -  Margins uninvolved by carcinoma (0.4 cm; superior margin)  -  Atypical intraductal papilloma  -  Usual ductal hyperplasia  -  Benign skin with seborrheic keratosis  -  Previous surgical site changes  -  No invasive carcinoma identified    12/06/2019 -  Chemotherapy   Adjuvant chemo TC q3weeks for 4 cycles starting 12/06/19. Had reaction with start of C1 infusion and was d/c. Switched to weekly Abraxane for 12 weeks starting 12/19/19      CURRENT THERAPY:  Adjuvant chemo TC q3weeks for 4 cyclesstarting 12/06/19. Had reaction with start of C1 infusion and was d/c. Switched to weekly Abraxane for 12 weeks starting next week.   INTERVAL HISTORY:  Shelly [name] is here for a follow up. They identified themselves by face to face video.  She had reaction to cycle 1 TC. She notes since then she had some IBS flare. She has been having abdominal cramps as well. She denies skin or further allergy reaction when she got home. Her husband feels her 20% risk of recurrence may not be worth trying chemo given her age. She is interested in trying less intensive chemo.   She notes she had her first COVID Vaccine on 12/05/19. She plans to have her second dose on 2/9.    REVIEW OF SYSTEMS:   Constitutional: Denies fevers, chills or abnormal weight loss Eyes: Denies blurriness of vision Ears, nose, mouth, throat, and face: Denies mucositis or sore throat Respiratory: Denies cough, dyspnea or wheezes Cardiovascular: Denies palpitation, chest discomfort or lower extremity swelling Gastrointestinal:  Denies nausea, heartburn or change in bowel habits Skin: Denies abnormal skin rashes Lymphatics: Denies new lymphadenopathy or easy bruising Neurological:Denies numbness, tingling or new weaknesses Behavioral/Psych: Mood is stable, no new changes  All other systems were reviewed with the patient and are negative.  MEDICAL HISTORY:  Past Medical History:  Diagnosis Date  . Arthritis    wrist, shoulder   . Cancer ([place]) 08/2019   left breast IDC  . Diverticulitis   . Endometrial polyp   . Family history of breast  cancer   . GERD (gastroesophageal reflux disease)    occasional - diet controlled  . Hx of adenomatous colonic polyps   . Hyperlipidemia   . IBS (irritable bowel syndrome)   . Missed abortion    x 1 - resolved - no surgery  . Osteoporosis   . SVD (spontaneous vaginal delivery)    x 1  . Transient vision disturbance 2019   was evaluated by ED, no findings  . Vitamin D deficiency     SURGICAL HISTORY: Past Surgical History:  Procedure Laterality Date  . BREAST LUMPECTOMY WITH RADIOACTIVE SEED AND SENTINEL LYMPH NODE BIOPSY Left 10/06/2019   Procedure: LEFT BREAST LUMPECTOMY  X2 WITH RADIOACTIVE SEED X2  AND SENTINEL LYMPH  NODE BIOPSY;  Surgeon: Jovita Kussmaul, MD;  Location: [facility];  Service: General;  Laterality: Left;  . BREAST LUMPECTOMY WITH RADIOACTIVE SEED LOCALIZATION Right 10/06/2019   Procedure: RIGHT BREAST LUMPECTOMY WITH RADIOACTIVE SEED LOCALIZATION;  Surgeon: Jovita Kussmaul, MD;  Location: [facility];  Service: General;  Laterality: Right;  . COLONOSCOPY    . DIAGNOSTIC LAPAROSCOPY     cysts  . DILATATION & CURETTAGE/HYSTEROSCOPY WITH TRUECLEAR N/A 10/31/2013   Procedure: DILATATION & CURETTAGE/HYSTEROSCOPY WITH TRUCLEAR;  Surgeon: Marylynn Pearson, MD;  Location: [facility];  Service: Gynecology;  Laterality: N/A;  . DILATION AND CURETTAGE OF UTERUS    . MASS EXCISION Left 11/06/2019   Procedure: EXCISION NIPPLE AND AREOLA  MARGIN LEFT BREAST;  Surgeon: Jovita Kussmaul, MD;  Location: [facility];  Service: General;  Laterality: Left;  . TONSILLECTOMY  1952  . WISDOM TOOTH EXTRACTION      I have reviewed the social history and family history with the patient and they are unchanged from previous note.  ALLERGIES:  is allergic to crestor [rosuvastatin calcium]; lipitor [atorvastatin calcium]; nsaids; pravastatin; and sulfonamide derivatives.  MEDICATIONS:  Current Outpatient Medications  Medication Sig Dispense Refill  . acetaminophen (TYLENOL) 500 MG tablet Take 500 mg by mouth every 6 (six) hours as needed for headache.    Marland Kitchen acetaminophen-codeine (TYLENOL #3) 300-30 MG tablet Take 1 tablet by mouth every 4 (four) hours as needed for moderate pain. (Patient taking differently: Take 1 tablet by mouth every 4 (four) hours as needed for moderate pain. ) 15 tablet 0  . bismuth subsalicylate (PEPTO BISMOL) 262 MG/15ML suspension Take 30 mLs by mouth every 6 (six) hours as needed.    . clidinium-chlordiazePOXIDE (LIBRAX) 5-2.5 MG capsule Take 1 capsule by mouth every 8 (eight) hours as needed (bowels). 30 capsule 0  . denosumab (PROLIA) 60 MG/ML SOSY  injection Inject 60 mg into the skin every 6 (six) months.    . dexamethasone (DECADRON) 4 MG tablet Take 1 tablet (4 mg total) by mouth 2 (two) times daily. Start the day before Taxotere. Then 1 tab daily the day after chemo for 3 days. 30 tablet 1  . esomeprazole (NEXIUM) 20 MG capsule Take 20 mg by mouth daily as needed (acid reflux).     Marland Kitchen HYDROcodone-acetaminophen (NORCO/VICODIN) 5-325 MG tablet Take 1-2 tablets by mouth every 6 (six) hours as needed for moderate pain or severe pain. 10 tablet 0  . loratadine (CLARITIN) 10 MG tablet Take 10 mg by mouth daily as needed for allergies.     Marland Kitchen LORazepam (ATIVAN) 0.5 MG tablet Take 1 tablet (0.5 mg total) by mouth every 8 (eight) hours as needed for anxiety. (Patient taking differently: Take 0.5 mg  by mouth every 8 (eight) hours as needed for anxiety. ) 20 tablet 0  . ondansetron (ZOFRAN) 8 MG tablet Take 1 tablet (8 mg total) by mouth 2 (two) times daily as needed for refractory nausea / vomiting. Start on day 3 after chemo. 30 tablet 1  . prochlorperazine (COMPAZINE) 10 MG tablet Take 1 tablet (10 mg total) by mouth every 6 (six) hours as needed (Nausea or vomiting). 30 tablet 1  . traMADol (ULTRAM) 50 MG tablet Take 1-2 tablets (50-100 mg total) by mouth every 6 (six) hours as needed. 20 tablet 0  . traMADol (ULTRAM) 50 MG tablet Take 1-2 tablets (50-100 mg total) by mouth every 6 (six) hours as needed. 20 tablet 0   No current facility-administered medications for this visit.    PHYSICAL EXAMINATION: ECOG PERFORMANCE STATUS: 1 - Symptomatic but completely ambulatory  No vitals taken today, Exam not performed today   LABORATORY DATA:  I have reviewed the data as listed CBC Latest Ref Rng & Units 12/06/2019 11/17/2018 08/21/2014  WBC 4.0 - 10.5 K/uL 13.2(H) 6.9 7.0  Hemoglobin 12.0 - 15.0 g/dL 13.9 14.7 13.4  Hematocrit 36.0 - 46.0 % 42.8 47.0(H) 40.0  Platelets 150 - 400 K/uL 335 308 337.0     CMP Latest Ref Rng & Units 12/06/2019 11/17/2018  01/07/2018  Glucose 70 - 99 mg/dL 113(H) 105(H) 113(H)  BUN 8 - 23 mg/dL _0 Creatinine 0.44 - 1.00 mg/dL 0.82 0.84 0.88  Sodium 135 - 145 mmol/L 141 140 140  Potassium 3.5 - 5.1 mmol/L 4.1 4.1 4.3  Chloride 98 - 111 mmol/L 109 105 105  CO2 22 - 32 mmol/L _1 Calcium 8.9 - 10.3 mg/dL 9.6 9.3 9.4  Total Protein 6.5 - 8.1 g/dL 7.3 7.3 -  Total Bilirubin 0.3 - 1.2 mg/dL 0.3 0.2(L) -  Alkaline Phos 38 - 126 U/L 78 64 -  AST 15 - 41 U/L 11(L) 18 -  ALT 0 - 44 U/L 17 19 -      RADIOGRAPHIC STUDIES: I have personally reviewed the radiological images as listed and agreed with the findings in the report. No results found.   ASSESSMENT & PLAN:  Shelly [name] is a 74 y.o. female with    1.Cancer of central portion of left female breast, StageIA, c(T1bN0M0), ER/PR+, HER2-, GradeII, pT1cN1aM0, stage IA  -She was diagnosed in 08/2019. She underwent B/l breast lumpectomy and SLNB with Dr. Marlou Starks on 10/06/19.Due to positive margins and 1/3 positive LNs she underwent re-excisional surgery on 11/06/19 which showed DCIS only, margins negative.She has high risk for recurrence based on Mammaprint.  -To reduce her risk of recurrence I started her on adjuvant chemo with TC q3weeks for 4 cycles starting 12/06/19.Given her advanced age, I reduced chemo dose by 10-15% for first cycle.  -With first TC infusion she had allergy reaction to docetaxel and [name] was not given, we discontinued. I recommend she proceed with less intensive single agent Abraxane weekly for 12 weeks.   --Chemotherapy consent: Side effects including but does not limited to, fatigue, nausea, vomiting, diarrhea, hair loss, neuropathy, fluid retention, renal and kidney dysfunction, neutropenic fever, needed for blood transfusion, bleeding, were discussed with patient in great detail.  -If she is not able to tolerate peripheral access, she can use PICC line. To reduce possible neuropathy I discussed using ice bags during  infusion.  -Another option is to forego chemo and proceed with adjuvant Radiation then antiestrogen therapy.  -  She opted to proceed with Abraxane, plan to start next week. Will skip week of 12/26/19 because she will receive her second COVID19 vaccine.  -F/u next week with NP Lacie.   2. Genetic Testing  -She has 3 maternal family members who had breast cancer and overall significant family history of cancer.She was seen by genetics on 11/28/18 and she did not want to pursue genetic testing.    3. Osteoporosis  -Diagnosed prior to 2007 in [place] in 06/2015 (-3.2 at [facility]) -On Prolia injections every 6 months since 2017-2018 -I wouldrecommend she have updated baseline DEXA before start of antiestrogen therapy.   4. Diarrhea and Acid Reflux  -She has been on Librax and Nexium. I encouraged her to continue Nexium BID as needed.  -She can use OTC and Imodium and Librax. If not enough I will call in Lomotil.   5. Anxiety, Trouble sleeping  -She is nervous about her cancer diagnosis and start of chemo.  -She did have trouble sleeping, etiology likely multifactorial. She is on oral dexa. Will reduce oral dexa to one tab daily the day before infusion and for 3 days after infusion.  -She is also on Ativan. She can use benadryl and Melatonin to help her sleep if needed.    PLAN: -Lab, F/u with NP Lacie and Abraxane on 2/2, off chemo th eweek of 2/9 due to COVID vaccination -Lab, f/u and Abraxane in 3, 4, 5 weeks    No problem-specific Assessment & Plan notes found for this encounter.   No orders of the defined types were placed in this encounter.  I discussed the assessment and treatment plan with the patient. The patient was provided an opportunity to ask questions and all were answered. The patient agreed with the plan and demonstrated an understanding of the instructions.  The patient was advised to call back or seek an in-person evaluation if the symptoms worsen  or if the condition fails to improve as anticipated.  The total time spent in the appointment was 25 minutes.    Truitt Merle, MD 12/12/2019   I, Joslyn Devon, am acting as scribe for Truitt Merle, MD.   I have reviewed the above documentation for accuracy and completeness, and I agree with the above.

## 2019-12-07 NOTE — Telephone Encounter (Signed)
Scheduled appt per 1/20 sch message and disregarded the los.  Spoke with pt and she is aware of the appt date and time.

## 2019-12-07 NOTE — Progress Notes (Signed)
DATE:  SN:[id number]                                          X  CHEMO/IMMUNOTHERAPY REACTION            MD:  Dr. Truitt Merle   AGENT/BLOOD PRODUCT RECEIVING TODAY:                   Taxotere and Cytoxan   AGENT/BLOOD PRODUCT RECEIVING IMMEDIATELY PRIOR TO REACTION:       Taxotere   VS: BP:      150/135   P:        93       SPO2:        99% on room air                BP:      173/125   P:        85       SPO2:        98% on room air   BP:      188/106   P:        84       SPO2:        100% on room air                BP:      206/74   P:        88       SPO2:        98% on room air    REACTION(S):           Back pain, facial, neck, and upper extremity erythema, throat tightness and swelling, and hypertension   PREMEDS:      Aloxi and dexamethasone   INTERVENTION: Solu-Medrol 125 mg IV x1, Pepcid 20 mg IV x1, Benadryl 25 mg IV x1, Tylenol 1000 mg p.o. x1, and epinephrine 0.5 mg subcu x1   Review of Systems  Review of Systems  Constitutional: Negative for chills, diaphoresis and fever.  HENT: Positive for trouble swallowing. Negative for voice change.        Throat tightness and swelling  Respiratory: Negative for cough, chest tightness, shortness of breath and wheezing.   Cardiovascular: Negative for chest pain and palpitations.  Gastrointestinal: Negative for abdominal pain, constipation, diarrhea, nausea and vomiting.  Musculoskeletal: Positive for back pain. Negative for myalgias.  Skin: Positive for color change.       Facial, neck, and upper extremity erythema  Neurological: Negative for dizziness, light-headedness and headaches.     Physical Exam  Physical Exam Constitutional:      General: She is not in acute distress.    Appearance: She is not diaphoretic.     Comments: And anxious appearing elderly female who appears to be in no acute distress.  HENT:     Head: Normocephalic and atraumatic.  Cardiovascular:     Rate and Rhythm: Normal rate and regular rhythm.   Heart sounds: No murmur. No friction rub. No gallop.   Pulmonary:     Effort: Pulmonary effort is normal. No respiratory distress.     Breath sounds: Normal breath sounds. No wheezing, rhonchi or rales.  Skin:    Findings: Erythema present.     Comments: Erythema over the neck, face, and bilateral upper extremities     OUTCOME:  The patient's symptoms resolved although she declined to have additional chemotherapy today.  She declined Cytoxan and she was not rechallenged with Taxotere.  Patient was given 500 mL of normal saline over 1 hour and then was released home.  Dr. Burr Medico will contact the patient to discuss future treatment options.    This patient was seen with Dr. Burr Medico with my treatment plan reviewed with her. She expressed agreement with my medical management of this patient.   Sandi Mealy, MHS, PA-C

## 2019-12-07 NOTE — Telephone Encounter (Signed)
I have sent a schedule message to do a virtual visit with her and her husband next Tuesday to discuss next step, thanks   Truitt Merle MD

## 2019-12-08 ENCOUNTER — Ambulatory Visit: Payer: Medicare Other

## 2019-12-12 ENCOUNTER — Encounter: Payer: Self-pay | Admitting: Hematology

## 2019-12-12 ENCOUNTER — Inpatient Hospital Stay (HOSPITAL_BASED_OUTPATIENT_CLINIC_OR_DEPARTMENT_OTHER): Payer: Medicare Other | Admitting: Hematology

## 2019-12-12 DIAGNOSIS — C50112 Malignant neoplasm of central portion of left female breast: Secondary | ICD-10-CM | POA: Diagnosis not present

## 2019-12-12 DIAGNOSIS — Z170 Estrogen receptor positive status [ER+]: Secondary | ICD-10-CM

## 2019-12-12 NOTE — Progress Notes (Signed)
DISCONTINUE ON PATHWAY REGIMEN - Breast     A cycle is every 21 days:     Docetaxel      Cyclophosphamide   **Always confirm dose/schedule in your pharmacy ordering system**  REASON: Toxicities / Adverse Event PRIOR TREATMENT: [id number]: TC - Docetaxel, Cyclophosphamide q21 Days x 4 Cycles TREATMENT RESPONSE: N/A - Adjuvant Therapy  START OFF PATHWAY REGIMEN - Breast   [id number]:Nab-paclitaxel 80 mg/m2 IV D1,8,15 q28 Days:   A cycle is every 28 days:     Nab-paclitaxel (protein bound)   **Always confirm dose/schedule in your pharmacy ordering system**  Patient Characteristics: Postoperative without Neoadjuvant Therapy (Pathologic Staging), Invasive Disease, Adjuvant Therapy, HER2 Negative/Unknown/Equivocal, ER Positive, Node Positive, Node Positive (1-3), MammaPrint(R) Ordered, High Genomic Risk Therapeutic Status: Postoperative without Neoadjuvant Therapy (Pathologic Staging) AJCC Grade: G2 AJCC N Category: pN1a AJCC M Category: cM0 ER Status: Positive (+) AJCC 8 Stage Grouping: IA HER2 Status: Negative (-) Oncotype Dx Recurrence Score: Not Appropriate AJCC T Category: pT1c PR Status: Positive (+) Has this patient completed genomic testing<= Yes - MammaPrint(R) MammaPrint(R) Score: High Genomic Risk Intent of Therapy: Curative Intent, Discussed with Patient

## 2019-12-13 ENCOUNTER — Telehealth: Payer: Self-pay | Admitting: *Deleted

## 2019-12-13 NOTE — Telephone Encounter (Signed)
Prior Authorization needed for Chlordiazepoxide-Clidinium 5-2.5 mg.    [facility] order [facility].  Quantity of 60 to 100 pills best for cost if PA obtained.  Brand name not available.  Verbal order received and read back from Dr. Burr Medico to cancel courtesy refill provided at patient request. Patient and [name] notified of provider order to obtain order and authorization from PCP.   Shelly [name] reports she "will contact Dr. Elease Hashimoto for visit to discusses.  I have a few [employer] brand on hand to use if needed.  Cannot afford brand and the new generic [facility] does not work as well as the [facility] brand is why I asked Dr. Burr Medico for an order." [place] cancelling order received.

## 2019-12-14 DIAGNOSIS — H43819 Vitreous degeneration, unspecified eye: Secondary | ICD-10-CM | POA: Diagnosis not present

## 2019-12-14 DIAGNOSIS — H524 Presbyopia: Secondary | ICD-10-CM | POA: Diagnosis not present

## 2019-12-14 DIAGNOSIS — H52223 Regular astigmatism, bilateral: Secondary | ICD-10-CM | POA: Diagnosis not present

## 2019-12-14 DIAGNOSIS — H5211 Myopia, right eye: Secondary | ICD-10-CM | POA: Diagnosis not present

## 2019-12-19 ENCOUNTER — Telehealth: Payer: Self-pay | Admitting: Nurse Practitioner

## 2019-12-19 NOTE — Telephone Encounter (Signed)
Scheduled appt per 2/2 staff message.  Spoke with pt and she is aware of her appt date and time.

## 2019-12-20 ENCOUNTER — Other Ambulatory Visit: Payer: Self-pay

## 2019-12-20 ENCOUNTER — Inpatient Hospital Stay: Payer: Medicare Other

## 2019-12-20 ENCOUNTER — Encounter: Payer: Self-pay | Admitting: *Deleted

## 2019-12-20 ENCOUNTER — Inpatient Hospital Stay: Payer: Medicare Other | Attending: Hematology | Admitting: Hematology

## 2019-12-20 ENCOUNTER — Inpatient Hospital Stay: Payer: Medicare Other | Admitting: Hematology

## 2019-12-20 ENCOUNTER — Encounter: Payer: Self-pay | Admitting: Hematology

## 2019-12-20 VITALS — BP 157/67 | HR 73 | Temp 98.2°F | Resp 20 | Ht 66.0 in | Wt 181.8 lb

## 2019-12-20 DIAGNOSIS — K580 Irritable bowel syndrome with diarrhea: Secondary | ICD-10-CM | POA: Diagnosis not present

## 2019-12-20 DIAGNOSIS — C50112 Malignant neoplasm of central portion of left female breast: Secondary | ICD-10-CM

## 2019-12-20 DIAGNOSIS — Z170 Estrogen receptor positive status [ER+]: Secondary | ICD-10-CM

## 2019-12-20 DIAGNOSIS — G4700 Insomnia, unspecified: Secondary | ICD-10-CM | POA: Diagnosis not present

## 2019-12-20 DIAGNOSIS — Z5111 Encounter for antineoplastic chemotherapy: Secondary | ICD-10-CM | POA: Diagnosis not present

## 2019-12-20 DIAGNOSIS — F419 Anxiety disorder, unspecified: Secondary | ICD-10-CM | POA: Diagnosis not present

## 2019-12-20 DIAGNOSIS — M810 Age-related osteoporosis without current pathological fracture: Secondary | ICD-10-CM | POA: Insufficient documentation

## 2019-12-20 LAB — CBC WITH DIFFERENTIAL (CANCER CENTER ONLY)
Abs Immature Granulocytes: 0.01 10*3/uL (ref 0.00–0.07)
Basophils Absolute: 0.1 10*3/uL (ref 0.0–0.1)
Basophils Relative: 1 %
Eosinophils Absolute: 0.1 10*3/uL (ref 0.0–0.5)
Eosinophils Relative: 1 %
HCT: 41.6 % (ref 36.0–46.0)
Hemoglobin: 13.6 g/dL (ref 12.0–15.0)
Immature Granulocytes: 0 %
Lymphocytes Relative: 28 %
Lymphs Abs: 1.7 10*3/uL (ref 0.7–4.0)
MCH: 28.6 pg (ref 26.0–34.0)
MCHC: 32.7 g/dL (ref 30.0–36.0)
MCV: 87.6 fL (ref 80.0–100.0)
Monocytes Absolute: 0.5 10*3/uL (ref 0.1–1.0)
Monocytes Relative: 8 %
Neutro Abs: 3.7 10*3/uL (ref 1.7–7.7)
Neutrophils Relative %: 62 %
Platelet Count: 260 10*3/uL (ref 150–400)
RBC: 4.75 MIL/uL (ref 3.87–5.11)
RDW: 14 % (ref 11.5–15.5)
WBC Count: 6.1 10*3/uL (ref 4.0–10.5)
nRBC: 0 % (ref 0.0–0.2)

## 2019-12-20 LAB — CMP (CANCER CENTER ONLY)
ALT: 16 U/L (ref 0–44)
AST: 12 U/L — ABNORMAL LOW (ref 15–41)
Albumin: 3.5 g/dL (ref 3.5–5.0)
Alkaline Phosphatase: 69 U/L (ref 38–126)
Anion gap: 10 (ref 5–15)
BUN: 17 mg/dL (ref 8–23)
CO2: 24 mmol/L (ref 22–32)
Calcium: 8.7 mg/dL — ABNORMAL LOW (ref 8.9–10.3)
Chloride: 108 mmol/L (ref 98–111)
Creatinine: 0.83 mg/dL (ref 0.44–1.00)
GFR, Est AFR Am: >60 mL/min (ref >60)
GFR, Estimated: >60 mL/min (ref >60)
Glucose, Bld: 98 mg/dL (ref 70–99)
Potassium: 4.2 mmol/L (ref 3.5–5.1)
Sodium: 142 mmol/L (ref 135–145)
Total Bilirubin: 0.3 mg/dL (ref 0.3–1.2)
Total Protein: 6.7 g/dL (ref 6.5–8.1)

## 2019-12-20 MED ORDER — SODIUM CHLORIDE 0.9 % IV SOLN
Freq: Once | INTRAVENOUS | Status: AC
  Filled 2019-12-20: qty 250

## 2019-12-20 MED ORDER — PROCHLORPERAZINE MALEATE 10 MG PO TABS
10.0000 mg | ORAL_TABLET | Freq: Once | ORAL | Status: AC
  Administered 2019-12-20: 10:00:00 10 mg via ORAL

## 2019-12-20 MED ORDER — DIPHENHYDRAMINE HCL 50 MG/ML IJ SOLN
INTRAMUSCULAR | Status: AC
  Filled 2019-12-20: qty 1

## 2019-12-20 MED ORDER — DIPHENHYDRAMINE HCL 50 MG/ML IJ SOLN
25.0000 mg | Freq: Once | INTRAMUSCULAR | Status: AC
  Administered 2019-12-20: 25 mg via INTRAVENOUS

## 2019-12-20 MED ORDER — METHYLPREDNISOLONE SODIUM SUCC 125 MG IJ SOLR
60.0000 mg | Freq: Once | INTRAMUSCULAR | Status: AC
  Administered 2019-12-20: 60 mg via INTRAVENOUS

## 2019-12-20 MED ORDER — METHYLPREDNISOLONE SODIUM SUCC 125 MG IJ SOLR
INTRAMUSCULAR | Status: AC
  Filled 2019-12-20: qty 2

## 2019-12-20 MED ORDER — PROCHLORPERAZINE MALEATE 10 MG PO TABS
ORAL_TABLET | ORAL | Status: AC
  Filled 2019-12-20: qty 1

## 2019-12-20 MED ORDER — PACLITAXEL PROTEIN-BOUND CHEMO INJECTION 100 MG
80.0000 mg/m2 | Freq: Once | INTRAVENOUS | Status: AC
  Administered 2019-12-20: 150 mg via INTRAVENOUS
  Filled 2019-12-20: qty 30

## 2019-12-20 NOTE — Patient Instructions (Signed)
[facility] Discharge Instructions for Patients Receiving Chemotherapy  Today you received the following chemotherapy agents abraxane  To help prevent nausea and vomiting after your treatment, we encourage you to take your nausea medication as directed If you develop nausea and vomiting that is not controlled by your nausea medication, call the clinic.   BELOW ARE SYMPTOMS THAT SHOULD BE REPORTED IMMEDIATELY:  *FEVER GREATER THAN 100.5 F  *CHILLS WITH OR WITHOUT FEVER  NAUSEA AND VOMITING THAT IS NOT CONTROLLED WITH YOUR NAUSEA MEDICATION  *UNUSUAL SHORTNESS OF BREATH  *UNUSUAL BRUISING OR BLEEDING  TENDERNESS IN MOUTH AND THROAT WITH OR WITHOUT PRESENCE OF ULCERS  *URINARY PROBLEMS  *BOWEL PROBLEMS  UNUSUAL RASH Items with * indicate a potential emergency and should be followed up as soon as possible.  Feel free to call the clinic should you have any questions or concerns. The clinic phone number is [phone].  Please show the [facility] at check-in to the Emergency Department and triage nurse.  Nanoparticle Albumin-Bound Paclitaxel injection What is this medicine? NANOPARTICLE ALBUMIN-BOUND PACLITAXEL (Na no PAHR ti kuhl al BYOO muhn-bound PAK li TAX el) is a chemotherapy drug. It targets fast dividing cells, like cancer cells, and causes these cells to die. This medicine is used to treat advanced breast cancer, lung cancer, and pancreatic cancer. This medicine may be used for other purposes; ask your health care provider or pharmacist if you have questions. COMMON BRAND NAME(S): Abraxane What should I tell my health care provider before I take this medicine? They need to know if you have any of these conditions:  kidney disease  liver disease  low blood counts, like low white cell, platelet, or red cell counts  lung or breathing disease, like asthma  tingling of the fingers or toes, or other nerve disorder  an unusual or allergic reaction  to paclitaxel, albumin, other chemotherapy, other medicines, foods, dyes, or preservatives  pregnant or trying to get pregnant  breast-feeding How should I use this medicine? This drug is given as an infusion into a vein. It is administered in a hospital or clinic by a specially trained health care professional. Talk to your pediatrician regarding the use of this medicine in children. Special care may be needed. Overdosage: If you think you have taken too much of this medicine contact a poison control center or emergency room at once. NOTE: This medicine is only for you. Do not share this medicine with others. What if I miss a dose? It is important not to miss your dose. Call your doctor or health care professional if you are unable to keep an appointment. What may interact with this medicine? This medicine may interact with the following medications:  antiviral medicines for hepatitis, HIV or AIDS  certain antibiotics like erythromycin and clarithromycin  certain medicines for fungal infections like ketoconazole and itraconazole  certain medicines for seizures like carbamazepine, phenobarbital, phenytoin  gemfibrozil  nefazodone  rifampin  St. John's wort This list may not describe all possible interactions. Give your health care provider a list of all the medicines, herbs, non-prescription drugs, or dietary supplements you use. Also tell them if you smoke, drink alcohol, or use illegal drugs. Some items may interact with your medicine. What should I watch for while using this medicine? Your condition will be monitored carefully while you are receiving this medicine. You will need important blood work done while you are taking this medicine. This medicine can cause serious allergic reactions. If  you experience allergic reactions like skin rash, itching or hives, swelling of the face, lips, or tongue, tell your doctor or health care professional right away. In some cases, you may be  given additional medicines to help with side effects. Follow all directions for their use. This drug may make you feel generally unwell. This is not uncommon, as chemotherapy can affect healthy cells as well as cancer cells. Report any side effects. Continue your course of treatment even though you feel ill unless your doctor tells you to stop. Call your doctor or health care professional for advice if you get a fever, chills or sore throat, or other symptoms of a cold or flu. Do not treat yourself. This drug decreases your body's ability to fight infections. Try to avoid being around people who are sick. This medicine may increase your risk to bruise or bleed. Call your doctor or health care professional if you notice any unusual bleeding. Be careful brushing and flossing your teeth or using a toothpick because you may get an infection or bleed more easily. If you have any dental work done, tell your dentist you are receiving this medicine. Avoid taking products that contain aspirin, acetaminophen, ibuprofen, naproxen, or ketoprofen unless instructed by your doctor. These medicines may hide a fever. Do not become pregnant while taking this medicine or for 6 months after stopping it. Women should inform their doctor if they wish to become pregnant or think they might be pregnant. Men should not father a child while taking this medicine or for 3 months after stopping it. There is a potential for serious side effects to an unborn child. Talk to your health care professional or pharmacist for more information. Do not breast-feed an infant while taking this medicine or for 2 weeks after stopping it. This medicine may interfere with the ability to get pregnant or to father a child. You should talk to your doctor or health care professional if you are concerned about your fertility. What side effects may I notice from receiving this medicine? Side effects that you should report to your doctor or health care  professional as soon as possible:  allergic reactions like skin rash, itching or hives, swelling of the face, lips, or tongue  breathing problems  changes in vision  fast, irregular heartbeat  low blood pressure  mouth sores  pain, tingling, numbness in the hands or feet  signs of decreased platelets or bleeding - bruising, pinpoint red spots on the skin, black, tarry stools, blood in the urine  signs of decreased red blood cells - unusually weak or tired, feeling faint or lightheaded, falls  signs of infection - fever or chills, cough, sore throat, pain or difficulty passing urine  signs and symptoms of liver injury like dark yellow or brown urine; general ill feeling or flu-like symptoms; light-colored stools; loss of appetite; nausea; right upper belly pain; unusually weak or tired; yellowing of the eyes or skin  swelling of the ankles, feet, hands  unusually slow heartbeat Side effects that usually do not require medical attention (report to your doctor or health care professional if they continue or are bothersome):  diarrhea  hair loss  loss of appetite  nausea, vomiting  tiredness This list may not describe all possible side effects. Call your doctor for medical advice about side effects. You may report side effects to FDA at 1-800-FDA-1088. Where should I keep my medicine? This drug is given in a hospital or clinic and will not be stored  at home. NOTE: This sheet is a summary. It may not cover all possible information. If you have questions about this medicine, talk to your doctor, pharmacist, or health care provider.  2020 Elsevier/Gold Standard (2017-07-06 13:03:45)

## 2019-12-20 NOTE — Progress Notes (Signed)
cbc

## 2019-12-20 NOTE — Progress Notes (Signed)
[facility]   [phone] [phone]) [phone]   Clinic Follow up Note   Patient Care Team: Eulas Post, MD as PCP - General (Family Medicine) Barbaraann Cao, [place] as Referring Physician (Optometry) Jovita Kussmaul, MD as Consulting Physician (General Surgery) Mauro Kaufmann, RN as Oncology Nurse Navigator Rockwell Germany, RN as Oncology Nurse Navigator  Date of Service:  12/20/2019  CHIEF COMPLAINT: F/u of left breast cancer  SUMMARY OF ONCOLOGIC HISTORY: Oncology History Overview Note  Cancer Staging Cancer of central portion of left female breast [facility]) Staging form: Breast, AJCC 8th Edition - Clinical stage from 09/18/2019: Stage IA (cT1b, cN0, cM0, G2, ER+, PR+, HER2-) - Signed by Truitt Merle, MD on 09/18/2019 - Pathologic stage from 10/06/2019: Stage IA (pT1c, pN1a, cM0, G2, ER+, PR+, HER2-) - Signed by Truitt Merle, MD on 10/19/2019    Cancer of central portion of left female breast ([place])  08/24/2019 Mammogram   Diagnostic mammogram 08/24/19  IMPRESSION: 1. Left breast mass measuring 0.8 x 0.4 x 0.5 cm at the 1 o'clock retroareolar region is suspicious and likely corresponds to the area of distortion seen on mammography.   2. Left breast 3 o'clock retroareolar dilated duct with internal debris versus solid mass.   3. Right breast retroareolar focal asymmetry without sonographic correlate.   08/28/2019 Initial Biopsy   Final Diagnosis 08/28/19 1. Breast, left, needle core biopsy, 1 o'clock/retroareolar - INVASIVE DUCTAL CARCINOMA, GRADE II. - PERINEURAL INVASION. - SEE MICROSCOPIC DESCRIPTION. 2. Breast, left, needle core biopsy, 3 o'clock retroareolar - DUCTAL PAPILLOMA. - SEE MICROSCOPIC DESCRIPTION.   08/28/2019 Receptors her2   1. PROGNOSTIC INDICATORS 08/28/19 Results: IMMUNOHISTOCHEMICAL AND MORPHOMETRIC ANALYSIS PERFORMED MANUALLY The tumor cells are NEGATIVE for Her2 (0). Estrogen Receptor: 100%, POSITIVE, STRONG STAINING  INTENSITY Progesterone Receptor: 50%, POSITIVE, STRONG STAINING INTENSITY Proliferation Marker Ki67: 12%   09/01/2019 Pathology Results   Diagnosis 09/01/19 Breast, right, needle core biopsy, retroareolar - COMPLEX SCLEROSING LESION WITH A PAPILLARY SCLEROSING LESION AND USUAL DUCTAL HYPERPLASIA. SEE NOTE - NEGATIVE FOR CARCINOMA   09/18/2019 Initial Diagnosis   Cancer of central portion of left female breast ([place])   09/18/2019 Cancer Staging   Staging form: Breast, AJCC 8th Edition - Clinical stage from 09/18/2019: Stage IA (cT1b, cN0, cM0, G2, ER+, PR+, HER2-) - Signed by Truitt Merle, MD on 09/18/2019   10/06/2019 Surgery   LEFT BREAST LUMPECTOMY  X2 WITH RADIOACTIVE SEED X2  AND SENTINEL LYMPH NODE BIOPSY and  RIGHT BREAST LUMPECTOMY WITH RADIOACTIVE SEED LOCALIZATION by Dr Marlou Starks  10/06/19    10/06/2019 Pathology Results   FINAL MICROSCOPIC DIAGNOSIS: 10/06/19   A. BREAST, RIGHT, LUMPECTOMY:  - Fibrocystic change with a small incidental intraductal papilloma and  usual ductal hyperplasia  - Biopsy site changes  - Negative for carcinoma   B. LYMPH NODE, LEFT #1, SENTINEL, BIOPSY:  - Metastatic carcinoma to a lymph node (1/1)  - Focus of metastatic carcinoma measures 2.5 mm and shows extranodal  extension   C. LYMPH NODE, LEFT #2, SENTINEL, BIOPSY:  - Lymph node, negative for carcinoma (0/1)   D. LYMPH NODE, LEFT #3, SENTINEL, BIOPSY:  - Lymph node, negative for carcinoma (0/1)   E. BREAST, LEFT, LUMPECTOMY:  - Invasive ductal carcinoma, grade 2, 1.8 cm  - Ductal carcinoma in situ, intermediate grade  - Invasive carcinoma focally involves the anterior margin  - DCIS is less than 1 mm from the superior margin  - Background breast parenchyma with fibrocystic  change, usual ductal  hyperplasia and intraductal papilloma.  - Biopsy site changes  - See oncology table    10/06/2019 Miscellaneous   Her Mammaprint showed High Risk Luminal Type B, risk of recurrence in the next  10 years is 29% in high risk disease. MPI -0.335    10/06/2019 Cancer Staging   Staging form: Breast, AJCC 8th Edition - Pathologic stage from 10/06/2019: Stage IA (pT1c, pN1a, cM0, G2, ER+, PR+, HER2-) - Signed by Truitt Merle, MD on 10/19/2019   11/06/2019 Surgery   EXCISION NIPPLE AND AREOLA  MARGIN LEFT BREAST by Dr Marlou Starks  11/06/19    11/06/2019 Pathology Results    FINAL MICROSCOPIC DIAGNOSIS: 11/06/19  A. BREAST, LEFT CENTRAL, RE-EXCISION:  -  Residual ductal carcinoma in situ, intermediate grade  -  Margins uninvolved by carcinoma (0.4 cm; superior margin)  -  Atypical intraductal papilloma  -  Usual ductal hyperplasia  -  Benign skin with seborrheic keratosis  -  Previous surgical site changes  -  No invasive carcinoma identified    12/06/2019 -  Chemotherapy   Adjuvant chemo TC q3weeks for 4 cycles starting 12/06/19. Had reaction with start of C1 infusion and was d/c. Switched to weekly Abraxane for 12 weeks starting 12/20/19       CURRENT THERAPY:  Adjuvant chemo TC q3weeks for 4 cyclesstarting1/20/21. Had reaction with start of C1 infusion and was d/c. Switched to weekly Abraxane for 12 weeks starting 12/20/19   INTERVAL HISTORY:  Shelly [name] is here for a follow up. She presents to the clinic alone. She notes she is ready for her new treatment. She notes she is still nervous. She has IBS flare this week and she has taken Librax. She related this to stress. She notes she did eat breakfast and tries to fry more water.  I reviewed her medication list with her.    REVIEW OF SYSTEMS:   Constitutional: Denies fevers, chills or abnormal weight loss Eyes: Denies blurriness of vision Ears, nose, mouth, throat, and face: Denies mucositis or sore throat Respiratory: Denies cough, dyspnea or wheezes Cardiovascular: Denies palpitation, chest discomfort or lower extremity swelling Gastrointestinal:  Denies nausea, heartburn or change in bowel habits (+) IBS flare  Skin: Denies  abnormal skin rashes Lymphatics: Denies new lymphadenopathy or easy bruising Neurological:Denies numbness, tingling or new weaknesses Behavioral/Psych: Mood is stable, no new changes  All other systems were reviewed with the patient and are negative.  MEDICAL HISTORY:  Past Medical History:  Diagnosis Date  . Arthritis    wrist, shoulder   . Cancer ([place]) 08/2019   left breast IDC  . Diverticulitis   . Endometrial polyp   . Family history of breast cancer   . GERD (gastroesophageal reflux disease)    occasional - diet controlled  . Hx of adenomatous colonic polyps   . Hyperlipidemia   . IBS (irritable bowel syndrome)   . Missed abortion    x 1 - resolved - no surgery  . Osteoporosis   . SVD (spontaneous vaginal delivery)    x 1  . Transient vision disturbance 2019   was evaluated by ED, no findings  . Vitamin D deficiency     SURGICAL HISTORY: Past Surgical History:  Procedure Laterality Date  . BREAST LUMPECTOMY WITH RADIOACTIVE SEED AND SENTINEL LYMPH NODE BIOPSY Left 10/06/2019   Procedure: LEFT BREAST LUMPECTOMY  X2 WITH RADIOACTIVE SEED X2  AND SENTINEL LYMPH NODE BIOPSY;  Surgeon: Jovita Kussmaul, MD;  Location:  [facility];  Service: General;  Laterality: Left;  . BREAST LUMPECTOMY WITH RADIOACTIVE SEED LOCALIZATION Right 10/06/2019   Procedure: RIGHT BREAST LUMPECTOMY WITH RADIOACTIVE SEED LOCALIZATION;  Surgeon: Jovita Kussmaul, MD;  Location: [facility];  Service: General;  Laterality: Right;  . COLONOSCOPY    . DIAGNOSTIC LAPAROSCOPY     cysts  . DILATATION & CURETTAGE/HYSTEROSCOPY WITH TRUECLEAR N/A 10/31/2013   Procedure: DILATATION & CURETTAGE/HYSTEROSCOPY WITH TRUCLEAR;  Surgeon: Marylynn Pearson, MD;  Location: [facility];  Service: Gynecology;  Laterality: N/A;  . DILATION AND CURETTAGE OF UTERUS    . MASS EXCISION Left 11/06/2019   Procedure: EXCISION NIPPLE AND AREOLA  MARGIN LEFT BREAST;  Surgeon: Jovita Kussmaul, MD;  Location:  [facility];  Service: General;  Laterality: Left;  . TONSILLECTOMY  1952  . WISDOM TOOTH EXTRACTION      I have reviewed the social history and family history with the patient and they are unchanged from previous note.  ALLERGIES:  is allergic to taxotere [docetaxel]; crestor [rosuvastatin calcium]; lipitor [atorvastatin calcium]; nsaids; pravastatin; and sulfonamide derivatives.  MEDICATIONS:  Current Outpatient Medications  Medication Sig Dispense Refill  . acetaminophen (TYLENOL) 500 MG tablet Take 500 mg by mouth every 6 (six) hours as needed for headache.    Marland Kitchen acetaminophen-codeine (TYLENOL #3) 300-30 MG tablet Take 1 tablet by mouth every 4 (four) hours as needed for moderate pain. (Patient taking differently: Take 1 tablet by mouth every 4 (four) hours as needed for moderate pain. ) 15 tablet 0  . bismuth subsalicylate (PEPTO BISMOL) 262 MG/15ML suspension Take 30 mLs by mouth every 6 (six) hours as needed.    . clidinium-chlordiazePOXIDE (LIBRAX) 5-2.5 MG capsule Take 1 capsule by mouth every 8 (eight) hours as needed (bowels). 30 capsule 0  . denosumab (PROLIA) 60 MG/ML SOSY injection Inject 60 mg into the skin every 6 (six) months.    . esomeprazole (NEXIUM) 20 MG capsule Take 20 mg by mouth daily as needed (acid reflux).     Marland Kitchen HYDROcodone-acetaminophen (NORCO/VICODIN) 5-325 MG tablet Take 1-2 tablets by mouth every 6 (six) hours as needed for moderate pain or severe pain. 10 tablet 0  . loratadine (CLARITIN) 10 MG tablet Take 10 mg by mouth daily as needed for allergies.     Marland Kitchen LORazepam (ATIVAN) 0.5 MG tablet Take 1 tablet (0.5 mg total) by mouth every 8 (eight) hours as needed for anxiety. (Patient taking differently: Take 0.5 mg by mouth every 8 (eight) hours as needed for anxiety. ) 20 tablet 0  . traMADol (ULTRAM) 50 MG tablet Take 1-2 tablets (50-100 mg total) by mouth every 6 (six) hours as needed. 20 tablet 0  . traMADol (ULTRAM) 50 MG tablet Take 1-2 tablets  (50-100 mg total) by mouth every 6 (six) hours as needed. 20 tablet 0   No current facility-administered medications for this visit.    PHYSICAL EXAMINATION: ECOG PERFORMANCE STATUS: 0 - Asymptomatic  Vitals:   12/20/19 0850  BP: (!) 157/67  Pulse: 73  Resp: 20  Temp: 98.2 F (36.8 C)  SpO2: 100%   Filed Weights   12/20/19 0850  Weight: 181 lb 12.8 oz (82.5 kg)    GENERAL:alert, no distress and comfortable SKIN: skin color, texture, turgor are normal, no rashes or significant lesions EYES: normal, Conjunctiva are pink and non-injected, sclera clear  NECK: supple, thyroid normal size, non-tender, without nodularity LYMPH:  no palpable lymphadenopathy in the cervical, axillary  LUNGS: clear to auscultation and percussion with normal breathing effort HEART: regular rate & rhythm and no murmurs and no lower extremity edema ABDOMEN:abdomen soft, non-tender and normal bowel sounds Musculoskeletal:no cyanosis of digits and no clubbing  NEURO: alert & oriented x 3 with fluent speech, no focal motor/sensory deficits BREAST: S/p b/l lumpectomy. No palpable mass, nodules or adenopathy bilaterally. Breast exam benign.   LABORATORY DATA:  I have reviewed the data as listed CBC Latest Ref Rng & Units 12/20/2019 12/06/2019 11/17/2018  WBC 4.0 - 10.5 K/uL 6.1 13.2(H) 6.9  Hemoglobin 12.0 - 15.0 g/dL 13.6 13.9 14.7  Hematocrit 36.0 - 46.0 % 41.6 42.8 47.0(H)  Platelets 150 - 400 K/uL 260 335 308     CMP Latest Ref Rng & Units 12/20/2019 12/06/2019 11/17/2018  Glucose 70 - 99 mg/dL 98 113(H) 105(H)  BUN 8 - 23 mg/dL 17 16 13   Creatinine 0.44 - 1.00 mg/dL 0.83 0.82 0.84  Sodium 135 - 145 mmol/L 142 141 140  Potassium 3.5 - 5.1 mmol/L 4.2 4.1 4.1  Chloride 98 - 111 mmol/L 108 109 105  CO2 22 - 32 mmol/L 24 23 24   Calcium 8.9 - 10.3 mg/dL 8.7(L) 9.6 9.3  Total Protein 6.5 - 8.1 g/dL 6.7 7.3 7.3  Total Bilirubin 0.3 - 1.2 mg/dL 0.3 0.3 0.2(L)  Alkaline Phos 38 - 126 U/L 69 78 64  AST 15 - 41  U/L 12(L) 11(L) 18  ALT 0 - 44 U/L 16 17 19       RADIOGRAPHIC STUDIES: I have personally reviewed the radiological images as listed and agreed with the findings in the report. No results found.   ASSESSMENT & PLAN:  Shelly [name] is a 74 y.o. female with    1.Cancer of central portion of left female breast, StageIA, c(T1bN0M0), ER/PR+, HER2-, GradeII, pT1cN1aM0, stage IA -She was diagnosed in 08/2019. She underwent B/l breast lumpectomy and SLNB with Dr. Marlou Starks on 10/06/19.Due to positive margins and 1/3 positive LNs she underwent re-excisional surgery on 11/06/19 which showed DCIS only, margins negative.She has high risk for recurrence based on Mammaprint. -To reduce her risk of recurrence I started her on adjuvant chemo with TC q3weeks for 4 cyclesstarting 12/06/19.Given her advanced age, I reduced chemo dose for first cycle.  -With first TC infusion she had allergy reaction to docetaxel and [name] was not given on 12/06/19, we discontinued. She will proceed with less intensive single agent Abraxane weekly for 12 weeks starting 12/20/19.  -Labs reviewed and adequate to start Abraxane today. Will give pre-meds and slow infusion. Continue weekly. I encouraged her to take benadryl if needed at home.  -She will not need oral dexa given she is not on TC. I reviewed her medication list with her.  -Will skip week of 12/26/19 because she will receive her second COVID19 vaccine.  -F/u every 2 weeks    2. Genetic Testing  -She has 3 maternal family members who had breast cancer and overall significant family history of cancer.Shewas seen by genetics on1/13/20and she did not want to pursue genetic testing.   3. Osteoporosis  -Diagnosed prior to 2007 in [place] in 06/2015 (-3.2 at [facility]) -On Prolia injections every 6 months since 2017-2018 -I wouldrecommend she have updated baseline DEXA before start of antiestrogen therapy.   4. Diarrhea and Acid Reflux  -She  has been on Librax and Nexium. I encouraged her to continue Nexium BID as needed.  -She can use OTC and Imodium and Librax.  If not enough I will call in Lomotil.   5. Anxiety, Trouble sleeping  -She is nervous about her cancer diagnosis and start of chemo.  -She did have trouble sleeping, etiology likely multifactorial.  -She is also on Ativan. She can use benadryl and Melatonin to help her sleep if needed.   PLAN: -Lab reviewed and adequate to start Abraxane today with premeds -Lab and Abraxane in 2, 3, 4 weeks -F/u in 2 and 4 weeks    No problem-specific Assessment & Plan notes found for this encounter.   No orders of the defined types were placed in this encounter.  All questions were answered. The patient knows to call the clinic with any problems, questions or concerns. No barriers to learning was detected. The total time spent in the appointment was 30 minutes.     Truitt Merle, MD 12/20/2019   I, Joslyn Devon, am acting as scribe for Truitt Merle, MD.   I have reviewed the above documentation for accuracy and completeness, and I agree with the above.

## 2019-12-21 ENCOUNTER — Telehealth: Payer: Self-pay | Admitting: Hematology

## 2019-12-21 ENCOUNTER — Telehealth: Payer: Self-pay | Admitting: *Deleted

## 2019-12-21 NOTE — Telephone Encounter (Signed)
No los per 2/3.

## 2019-12-22 ENCOUNTER — Telehealth: Payer: Self-pay

## 2019-12-22 NOTE — Telephone Encounter (Signed)
Pt called stating she had a 10 min episode of rapid pulse when she took it was 154.  She felt burning in her head, neck, and back, denies dyspnea or chest pain. She did feel lightheaded and tried to walk it off. She states this lasted about 10 minutes.  I told her to monitor if she feels like this again to chedck her pulse rate and if it last more than 15 minutes or she has dyspnea and/or chest pain to go to the ED.  She verbalized understanding.

## 2019-12-25 ENCOUNTER — Telehealth: Payer: Self-pay

## 2019-12-25 NOTE — Telephone Encounter (Signed)
Shelly [name] called with the following:  Bladder pain, intermittent, no burning with urination and no urgency, afebrile Increase abd cramping and loose stools (5 x day), taking librax every 6 -8 hours.  Pt is able to eat and drink.  She states she is drinking a lot of water. Yesterday, intermittent left chest pain, no dyspnea, diaphoreses, the pain was also in her ears and neck.

## 2019-12-25 NOTE — Telephone Encounter (Signed)
I called her back, and we reviewed her symptoms. After a lengthy discussion, we decided to cancel her future chemo, and proceed with radiation next. She if feeling better today, and knows to call us if she does not recover well in next few days. She agrees to keep her next appointment on 2/17 for lab and OV with me. I will cancer her chemo appointments.   Truitt Merle MD

## 2019-12-26 ENCOUNTER — Other Ambulatory Visit: Payer: Self-pay

## 2019-12-26 ENCOUNTER — Telehealth (INDEPENDENT_AMBULATORY_CARE_PROVIDER_SITE_OTHER): Payer: Medicare Other | Admitting: Family Medicine

## 2019-12-26 ENCOUNTER — Encounter: Payer: Self-pay | Admitting: *Deleted

## 2019-12-26 DIAGNOSIS — K589 Irritable bowel syndrome without diarrhea: Secondary | ICD-10-CM | POA: Diagnosis not present

## 2019-12-26 DIAGNOSIS — M818 Other osteoporosis without current pathological fracture: Secondary | ICD-10-CM | POA: Diagnosis not present

## 2019-12-26 NOTE — Progress Notes (Signed)
This visit type was conducted due to national recommendations for restrictions regarding the COVID-19 pandemic in an effort to limit this patient's exposure and mitigate transmission in our community.   Virtual Visit via Telephone Note  I connected with Shelly [name] on 12/26/19 at  1:30 PM EST by telephone and verified that I am speaking with the correct person using two identifiers.   I discussed the limitations, risks, security and privacy concerns of performing an evaluation and management service by telephone and the availability of in person appointments. I also discussed with the patient that there may be a patient responsible charge related to this service. The patient expressed understanding and agreed to proceed.  Location patient: home Location provider: work or home office Participants present for the call: patient, provider Patient did not have a visit in the prior 7 days to address this/these issue(s).   History of Present Illness: Patient relates that she has had a couple of breast cancers recently.  She had breast cancer involving the left breast and unfortunately has had intolerance with a couple chemotherapy medications.  She is now here at radiation followed by hormonal therapy.  Reason for calling is that she has a long history of IBS symptoms.  She is been on Librax for several years.  She states she recently went to get refill and this was a generic version that does not seem to be as effective.  She is unable to get the brand that she is used previously.  She is only taken this sporadically in the past.  Denies any major appetite or weight changes.  She had her second Covid vaccine earlier today  Past Medical History:  Diagnosis Date  . Arthritis    wrist, shoulder   . Cancer ([place]) 08/2019   left breast IDC  . Diverticulitis   . Endometrial polyp   . Family history of breast cancer   . GERD (gastroesophageal reflux disease)    occasional - diet controlled  . Hx of  adenomatous colonic polyps   . Hyperlipidemia   . IBS (irritable bowel syndrome)   . Missed abortion    x 1 - resolved - no surgery  . Osteoporosis   . SVD (spontaneous vaginal delivery)    x 1  . Transient vision disturbance 2019   was evaluated by ED, no findings  . Vitamin D deficiency    Past Surgical History:  Procedure Laterality Date  . BREAST LUMPECTOMY WITH RADIOACTIVE SEED AND SENTINEL LYMPH NODE BIOPSY Left 10/06/2019   Procedure: LEFT BREAST LUMPECTOMY  X2 WITH RADIOACTIVE SEED X2  AND SENTINEL LYMPH NODE BIOPSY;  Surgeon: Jovita Kussmaul, MD;  Location: [facility];  Service: General;  Laterality: Left;  . BREAST LUMPECTOMY WITH RADIOACTIVE SEED LOCALIZATION Right 10/06/2019   Procedure: RIGHT BREAST LUMPECTOMY WITH RADIOACTIVE SEED LOCALIZATION;  Surgeon: Jovita Kussmaul, MD;  Location: [facility];  Service: General;  Laterality: Right;  . COLONOSCOPY    . DIAGNOSTIC LAPAROSCOPY     cysts  . DILATATION & CURETTAGE/HYSTEROSCOPY WITH TRUECLEAR N/A 10/31/2013   Procedure: DILATATION & CURETTAGE/HYSTEROSCOPY WITH TRUCLEAR;  Surgeon: Marylynn Pearson, MD;  Location: [facility];  Service: Gynecology;  Laterality: N/A;  . DILATION AND CURETTAGE OF UTERUS    . MASS EXCISION Left 11/06/2019   Procedure: EXCISION NIPPLE AND AREOLA  MARGIN LEFT BREAST;  Surgeon: Jovita Kussmaul, MD;  Location: [facility];  Service: General;  Laterality: Left;  . TONSILLECTOMY  1952  .  WISDOM TOOTH EXTRACTION      reports that she quit smoking about 12 years ago. Her smoking use included cigarettes. She has a 2.00 pack-year smoking history. She has never used smokeless tobacco. She reports current alcohol use of about 14.0 standard drinks of alcohol per week. She reports that she does not use drugs. family history includes Brain cancer in her paternal uncle; Breast cancer (age of onset: 9) in her cousin; Breast cancer (age of onset: 56) in her maternal aunt and  maternal aunt; Cancer in her maternal grandfather, maternal uncle, and another family member; Depression in her brother; Diabetes in her mother; Heart disease in an other family member; Hyperlipidemia in her father and mother; Hypertension in her mother and another family member; Leukemia (age of onset: 2) in her mother; Lung cancer in her father and maternal grandfather; Throat cancer in her paternal uncle; Uterine cancer in her paternal grandmother. Allergies  Allergen Reactions  . Taxotere [Docetaxel] Swelling  . Crestor [Rosuvastatin Calcium] Itching    GI upset  . Lipitor [Atorvastatin Calcium] Itching  . Nsaids Other (See Comments)    GI upset  . Pravastatin Itching    GI upset  . Sulfonamide Derivatives Rash    Hives, itiching      Observations/Objective: Patient sounds cheerful and well on the phone. I do not appreciate any SOB. Speech and thought processing are grossly intact. Patient reported vitals:  Assessment and Plan:  #1 history of IBS.  Symptoms overall stable.  -We encouraged her to use Librax only sparingly. -She is aware of importance of high-fiber diet.  #2 history of osteoporosis.  Patient treated with Prolia  -She is due for repeat Prolia injection in March and will call a few weeks prior to that being due to get scheduled  Follow Up Instructions:  -As above   99441 5-10 [id number] 11-20 [id number] 21-30 I did not refer this patient for an OV in the next 24 hours for this/these issue(s).  I discussed the assessment and treatment plan with the patient. The patient was provided an opportunity to ask questions and all were answered. The patient agreed with the plan and demonstrated an understanding of the instructions.   The patient was advised to call back or seek an in-person evaluation if the symptoms worsen or if the condition fails to improve as anticipated.  I provided 23 minutes of non-face-to-face time during this encounter.   Carolann Littler, MD

## 2019-12-27 ENCOUNTER — Ambulatory Visit: Payer: Medicare Other | Admitting: Nurse Practitioner

## 2019-12-27 ENCOUNTER — Other Ambulatory Visit: Payer: Medicare Other

## 2019-12-27 ENCOUNTER — Ambulatory Visit: Payer: Medicare Other

## 2019-12-28 NOTE — Progress Notes (Signed)
Shelly [name]   [phone] [phone]   Clinic Follow up Note   Patient Care Team: Eulas Post, MD as PCP - General (Family Medicine) Barbaraann Cao, [place] as Referring Physician (Optometry) Jovita Kussmaul, MD as Consulting Physician (General Surgery) Mauro Kaufmann, RN as Oncology Nurse Navigator Rockwell Germany, RN as Oncology Nurse Navigator  Date of Service:  01/03/2020  CHIEF COMPLAINT: F/u of left breast cancer  SUMMARY OF ONCOLOGIC HISTORY: Oncology History Overview Note  Cancer Staging Cancer of central portion of left female breast [facility]) Staging form: Breast, AJCC 8th Edition - Clinical stage from 09/18/2019: Stage IA (cT1b, cN0, cM0, G2, ER+, PR+, HER2-) - Signed by Truitt Merle, MD on 09/18/2019 - Pathologic stage from 10/06/2019: Stage IA (pT1c, pN1a, cM0, G2, ER+, PR+, HER2-) - Signed by Truitt Merle, MD on 10/19/2019    Cancer of central portion of left female breast ([place])  08/24/2019 Mammogram   Diagnostic mammogram 08/24/19  IMPRESSION: 1. Left breast mass measuring 0.8 x 0.4 x 0.5 cm at the 1 o'clock retroareolar region is suspicious and likely corresponds to the area of distortion seen on mammography.   2. Left breast 3 o'clock retroareolar dilated duct with internal debris versus solid mass.   3. Right breast retroareolar focal asymmetry without sonographic correlate.   08/28/2019 Initial Biopsy   Final Diagnosis 08/28/19 1. Breast, left, needle core biopsy, 1 o'clock/retroareolar - INVASIVE DUCTAL CARCINOMA, GRADE II. - PERINEURAL INVASION. - SEE MICROSCOPIC DESCRIPTION. 2. Breast, left, needle core biopsy, 3 o'clock retroareolar - DUCTAL PAPILLOMA. - SEE MICROSCOPIC DESCRIPTION.   08/28/2019 Receptors her2   1. PROGNOSTIC INDICATORS 08/28/19 Results: IMMUNOHISTOCHEMICAL AND MORPHOMETRIC ANALYSIS PERFORMED MANUALLY The tumor cells are NEGATIVE for Her2 (0). Estrogen Receptor: 100%, POSITIVE, STRONG STAINING  INTENSITY Progesterone Receptor: 50%, POSITIVE, STRONG STAINING INTENSITY Proliferation Marker Ki67: 12%   09/01/2019 Pathology Results   Diagnosis 09/01/19 Breast, right, needle core biopsy, retroareolar - COMPLEX SCLEROSING LESION WITH A PAPILLARY SCLEROSING LESION AND USUAL DUCTAL HYPERPLASIA. SEE NOTE - NEGATIVE FOR CARCINOMA   09/18/2019 Initial Diagnosis   Cancer of central portion of left female breast ([place])   09/18/2019 Cancer Staging   Staging form: Breast, AJCC 8th Edition - Clinical stage from 09/18/2019: Stage IA (cT1b, cN0, cM0, G2, ER+, PR+, HER2-) - Signed by Truitt Merle, MD on 09/18/2019   10/06/2019 Surgery   LEFT BREAST LUMPECTOMY  X2 WITH RADIOACTIVE SEED X2  AND SENTINEL LYMPH NODE BIOPSY and  RIGHT BREAST LUMPECTOMY WITH RADIOACTIVE SEED LOCALIZATION by Dr Marlou Starks  10/06/19    10/06/2019 Pathology Results   FINAL MICROSCOPIC DIAGNOSIS: 10/06/19   A. BREAST, RIGHT, LUMPECTOMY:  - Fibrocystic change with a small incidental intraductal papilloma and  usual ductal hyperplasia  - Biopsy site changes  - Negative for carcinoma   B. LYMPH NODE, LEFT #1, SENTINEL, BIOPSY:  - Metastatic carcinoma to a lymph node (1/1)  - Focus of metastatic carcinoma measures 2.5 mm and shows extranodal  extension   C. LYMPH NODE, LEFT #2, SENTINEL, BIOPSY:  - Lymph node, negative for carcinoma (0/1)   D. LYMPH NODE, LEFT #3, SENTINEL, BIOPSY:  - Lymph node, negative for carcinoma (0/1)   E. BREAST, LEFT, LUMPECTOMY:  - Invasive ductal carcinoma, grade 2, 1.8 cm  - Ductal carcinoma in situ, intermediate grade  - Invasive carcinoma focally involves the anterior margin  - DCIS is less than 1 mm from the superior margin  - Background breast parenchyma with fibrocystic  change, usual ductal  hyperplasia and intraductal papilloma.  - Biopsy site changes  - See oncology table    10/06/2019 Miscellaneous   Her Mammaprint showed High Risk Luminal Type B, risk of recurrence in the next  10 years is 29% in high risk disease. MPI -0.335    10/06/2019 Cancer Staging   Staging form: Breast, AJCC 8th Edition - Pathologic stage from 10/06/2019: Stage IA (pT1c, pN1a, cM0, G2, ER+, PR+, HER2-) - Signed by Truitt Merle, MD on 10/19/2019   11/06/2019 Surgery   EXCISION NIPPLE AND AREOLA  MARGIN LEFT BREAST by Dr Marlou Starks  11/06/19    11/06/2019 Pathology Results    FINAL MICROSCOPIC DIAGNOSIS: 11/06/19  A. BREAST, LEFT CENTRAL, RE-EXCISION:  -  Residual ductal carcinoma in situ, intermediate grade  -  Margins uninvolved by carcinoma (0.4 cm; superior margin)  -  Atypical intraductal papilloma  -  Usual ductal hyperplasia  -  Benign skin with seborrheic keratosis  -  Previous surgical site changes  -  No invasive carcinoma identified    12/06/2019 - 12/20/2019 Chemotherapy   Adjuvant chemo TC q3weeks for 4 cycles starting 12/06/19. Had reaction with start of C1 infusion and was d/c. Switched to weekly Abraxane for 12 weeks starting 12/20/19 but stopped after first dose due to poor toleration.       CURRENT THERAPY:  PENDING Radiation    INTERVAL HISTORY:  Shelly [name] is here for a follow up. She presents to the clinic with her husband. She had panic attack and when she checked her BP her level was in 200 range. Her heart rate was normal. She called EMS and her BP was 159. Her heart rate was normal. Her husband feels their machine may have malfunctioned. She denies chest pain last night but did have acid reflux. Her Nexium and librax. She has never been on antidepressants but note her anxiety can be tired to her IBS. She feels taking librax helps this.  She notes she is ready to proceed with RT. She notes her menopause at about age 74 and only temporarily used hormonal replacement for 2 months. She did not have significant symptoms from her menopause.    REVIEW OF SYSTEMS:   Constitutional: Denies fevers, chills or abnormal weight loss Eyes: Denies blurriness of vision Ears,  nose, mouth, throat, and face: Denies mucositis or sore throat Respiratory: Denies cough, dyspnea or wheezes Cardiovascular: Denies palpitation, chest discomfort or lower extremity swelling Gastrointestinal:  Denies nausea, heartburn or change in bowel habits Skin: Denies abnormal skin rashes Lymphatics: Denies new lymphadenopathy or easy bruising Neurological:Denies numbness, tingling or new weaknesses Behavioral/Psych: Mood is stable, no new changes (+) Anxiety  All other systems were reviewed with the patient and are negative.  MEDICAL HISTORY:  Past Medical History:  Diagnosis Date  . Arthritis    wrist, shoulder   . Cancer ([place]) 08/2019   left breast IDC  . Diverticulitis   . Endometrial polyp   . Family history of breast cancer   . GERD (gastroesophageal reflux disease)    occasional - diet controlled  . Hx of adenomatous colonic polyps   . Hyperlipidemia   . IBS (irritable bowel syndrome)   . Missed abortion    x 1 - resolved - no surgery  . Osteoporosis   . SVD (spontaneous vaginal delivery)    x 1  . Transient vision disturbance 2019   was evaluated by ED, no findings  . Vitamin D deficiency  SURGICAL HISTORY: Past Surgical History:  Procedure Laterality Date  . BREAST LUMPECTOMY WITH RADIOACTIVE SEED AND SENTINEL LYMPH NODE BIOPSY Left 10/06/2019   Procedure: LEFT BREAST LUMPECTOMY  X2 WITH RADIOACTIVE SEED X2  AND SENTINEL LYMPH NODE BIOPSY;  Surgeon: Jovita Kussmaul, MD;  Location: [facility] [facility];  Service: General;  Laterality: Left;  . BREAST LUMPECTOMY WITH RADIOACTIVE SEED LOCALIZATION Right 10/06/2019   Procedure: RIGHT BREAST LUMPECTOMY WITH RADIOACTIVE SEED LOCALIZATION;  Surgeon: Jovita Kussmaul, MD;  Location: [facility];  Service: General;  Laterality: Right;  . COLONOSCOPY    . DIAGNOSTIC LAPAROSCOPY     cysts  . DILATATION & CURETTAGE/HYSTEROSCOPY WITH TRUECLEAR N/A 10/31/2013   Procedure: DILATATION &  CURETTAGE/HYSTEROSCOPY WITH TRUCLEAR;  Surgeon: Marylynn Pearson, MD;  Location: [facility];  Service: Gynecology;  Laterality: N/A;  . DILATION AND CURETTAGE OF UTERUS    . MASS EXCISION Left 11/06/2019   Procedure: EXCISION NIPPLE AND AREOLA  MARGIN LEFT BREAST;  Surgeon: Jovita Kussmaul, MD;  Location: [facility];  Service: General;  Laterality: Left;  . TONSILLECTOMY  1954  . WISDOM TOOTH EXTRACTION      I have reviewed the social history and family history with the patient and they are unchanged from previous note.  ALLERGIES:  is allergic to taxotere [docetaxel]; crestor [rosuvastatin calcium]; lipitor [atorvastatin calcium]; nsaids; pravastatin; and sulfonamide derivatives.  MEDICATIONS:  Current Outpatient Medications  Medication Sig Dispense Refill  . acetaminophen (TYLENOL) 500 MG tablet Take 500 mg by mouth every 6 (six) hours as needed for headache.    Marland Kitchen acetaminophen-codeine (TYLENOL #3) 300-30 MG tablet Take 1 tablet by mouth every 4 (four) hours as needed for moderate pain. (Patient taking differently: Take 1 tablet by mouth every 4 (four) hours as needed for moderate pain. ) 15 tablet 0  . bismuth subsalicylate (PEPTO BISMOL) 262 MG/15ML suspension Take 30 mLs by mouth every 6 (six) hours as needed.    . clidinium-chlordiazePOXIDE (LIBRAX) 5-2.5 MG capsule Take 1 capsule by mouth every 8 (eight) hours as needed (bowels). 30 capsule 0  . denosumab (PROLIA) 60 MG/ML SOSY injection Inject 60 mg into the skin every 6 (six) months.    . esomeprazole (NEXIUM) 20 MG capsule Take 20 mg by mouth daily as needed (acid reflux).     Marland Kitchen HYDROcodone-acetaminophen (NORCO/VICODIN) 5-325 MG tablet Take 1-2 tablets by mouth every 6 (six) hours as needed for moderate pain or severe pain. 10 tablet 0  . loratadine (CLARITIN) 10 MG tablet Take 10 mg by mouth daily as needed for allergies.     Marland Kitchen LORazepam (ATIVAN) 0.5 MG tablet Take 1 tablet (0.5 mg total) by mouth every 8 (eight) hours as  needed for anxiety. (Patient taking differently: Take 0.5 mg by mouth every 8 (eight) hours as needed for anxiety. ) 20 tablet 0  . traMADol (ULTRAM) 50 MG tablet Take 1-2 tablets (50-100 mg total) by mouth every 6 (six) hours as needed. 20 tablet 0   No current facility-administered medications for this visit.    PHYSICAL EXAMINATION: ECOG PERFORMANCE STATUS: 1 - Symptomatic but completely ambulatory  Vitals:   01/03/20 1313  BP: (!) 159/72  Pulse: 84  Resp: 18  Temp: 98.3 F (36.8 C)  SpO2: 98%   Filed Weights   01/03/20 1313  Weight: 180 lb 3.2 oz (81.7 kg)    Due to COVID19 we will limit examination to appearance. Patient had no complaints.  GENERAL:alert, no distress  and comfortable SKIN: skin color normal, no rashes or significant lesions EYES: normal, Conjunctiva are pink and non-injected, sclera clear  NEURO: alert & oriented x 3 with fluent speech  LABORATORY DATA:  I have reviewed the data as listed CBC Latest Ref Rng & Units 01/03/2020 12/20/2019 12/06/2019  WBC 4.0 - 10.5 K/uL 5.7 6.1 13.2(H)  Hemoglobin 12.0 - 15.0 g/dL 13.3 13.6 13.9  Hematocrit 36.0 - 46.0 % 40.1 41.6 42.8  Platelets 150 - 400 K/uL 323 260 335     CMP Latest Ref Rng & Units 01/03/2020 12/20/2019 12/06/2019  Glucose 70 - 99 mg/dL 126(H) 98 113(H)  BUN 8 - 23 mg/dL 18 17 16   Creatinine 0.44 - 1.00 mg/dL 0.93 0.83 0.82  Sodium 135 - 145 mmol/L 143 142 141  Potassium 3.5 - 5.1 mmol/L 4.0 4.2 4.1  Chloride 98 - 111 mmol/L 107 108 109  CO2 22 - 32 mmol/L 26 24 23   Calcium 8.9 - 10.3 mg/dL 9.3 8.7(L) 9.6  Total Protein 6.5 - 8.1 g/dL 7.0 6.7 7.3  Total Bilirubin 0.3 - 1.2 mg/dL 0.3 0.3 0.3  Alkaline Phos 38 - 126 U/L 73 69 78  AST 15 - 41 U/L 12(L) 12(L) 11(L)  ALT 0 - 44 U/L 17 16 17       RADIOGRAPHIC STUDIES: I have personally reviewed the radiological images as listed and agreed with the findings in the report. No results found.   ASSESSMENT & PLAN:  Shelly [name] is a 74 y.o.  female with    1.Cancer of central portion of left female breast, StageIA, c(T1bN0M0), ER/PR+, HER2-, GradeII, pT1cN1aM0, stage IA -She was diagnosed in 08/2019. She underwent B/l breast lumpectomy and SLNB with Dr. Marlou Starks on 10/06/19.Due to positive margins and 1/3 positive LNs she underwent re-excisional surgery on 11/06/19 which showed DCIS only, margins negative.She has high risk for recurrence based on Mammaprint. -To reduce her risk of recurrence Istarted her onadjuvant chemo with TC q3weeks for 4 cyclesstarting 12/06/19.Given her advanced age,Ireducedchemo dose for first cycle.  -With first TC infusion she had allergy reactionto docetaxel and [name] was not given on 12/06/19, we discontinued. chemo cahnged to weekly Abraxane -2 days after Abraxane infusion she started to feel tachycardic, hot/flushed, and lightheaded. A few days later she had dysuria, diarrhea and abdominal cramps and intermittent chest pain. She opted to d/c chemo and proceed with RT.  -Today she has recovered from chemotherapy. She is looking forward to proceeding with RT. I will refer her back to Dr. Isidore Moos. After radiation I will discuss starting her on antiestrogen therapy. I briefly reviewed AI side effects with her, she is interested.  -F/u last week of RT -She has received both her COVID19 vaccinations.    2. Genetic Testing  -She has 3 maternal family members who had breast cancer and overall significant family history of cancer.Shewas seen by genetics on1/13/20and she did not want to pursue genetic testing.   3. Osteoporosis  -Diagnosed prior to 2007 in [place] in 06/2015 (-3.2 at [facility]) -On Prolia injections every 6 months since 2017-2018 -I wouldrecommend she have updated baseline DEXA before start of antiestrogen therapy.   4. Diarrhea and Acid Reflux  -She has been on Librax and Nexium. I encouraged her to continue Nexium BID as needed.  -She can use OTC and Imodium  and Librax. If not enough I will call in Lomotil.   5. Anxiety, insomnia  -She is nervous about her cancer diagnosis and chemo.  -  She did have trouble sleeping, etiology likely multifactorial.  -She is also on Ativan. She can use benadryl and Melatonin to help her sleep if needed. -Last night she has an episode of what appears to be a panic attack. Her initial BP was in 200 range with normal heart rate. She was able to calm down and have improved BP and was checked by EMS.  -She has not been on antidepressants before. She feels her anxiety is tied to her IBS so her anxiety is helped with Librax.  -I discussed Antiestrogen therapy can effect her anxiety and overall mood. I discussed the role of antidepressants if indicated while on antiestrogen therapy.    PLAN: -Send Rad Onc referral to Dr. Isidore Moos  -Lab and F/u on last week of Radiation    No problem-specific Assessment & Plan notes found for this encounter.   Orders Placed This Encounter  Procedures  . Ambulatory referral to Radiation Oncology    Referral Priority:   Routine    Referral Type:   Consultation    Referral Reason:   Specialty Services Required    Requested Specialty:   Radiation Oncology    Number of Visits Requested:   1   All questions were answered. The patient knows to call the clinic with any problems, questions or concerns. No barriers to learning was detected. The total time spent in the appointment was 25 minutes.     Truitt Merle, MD 01/03/2020   I, Joslyn Devon, am acting as scribe for Truitt Merle, MD.   I have reviewed the above documentation for accuracy and completeness, and I agree with the above.

## 2019-12-29 ENCOUNTER — Ambulatory Visit: Payer: Medicare Other

## 2020-01-03 ENCOUNTER — Other Ambulatory Visit: Payer: Self-pay

## 2020-01-03 ENCOUNTER — Ambulatory Visit: Payer: Medicare Other

## 2020-01-03 ENCOUNTER — Encounter: Payer: Self-pay | Admitting: *Deleted

## 2020-01-03 ENCOUNTER — Encounter: Payer: Self-pay | Admitting: Hematology

## 2020-01-03 ENCOUNTER — Inpatient Hospital Stay: Payer: Medicare Other

## 2020-01-03 ENCOUNTER — Inpatient Hospital Stay (HOSPITAL_BASED_OUTPATIENT_CLINIC_OR_DEPARTMENT_OTHER): Payer: Medicare Other | Admitting: Hematology

## 2020-01-03 VITALS — BP 159/72 | HR 84 | Temp 98.3°F | Resp 18 | Ht 66.0 in | Wt 180.2 lb

## 2020-01-03 DIAGNOSIS — Z170 Estrogen receptor positive status [ER+]: Secondary | ICD-10-CM

## 2020-01-03 DIAGNOSIS — Z5111 Encounter for antineoplastic chemotherapy: Secondary | ICD-10-CM | POA: Diagnosis not present

## 2020-01-03 DIAGNOSIS — F419 Anxiety disorder, unspecified: Secondary | ICD-10-CM | POA: Diagnosis not present

## 2020-01-03 DIAGNOSIS — C50112 Malignant neoplasm of central portion of left female breast: Secondary | ICD-10-CM

## 2020-01-03 DIAGNOSIS — M810 Age-related osteoporosis without current pathological fracture: Secondary | ICD-10-CM | POA: Diagnosis not present

## 2020-01-03 DIAGNOSIS — K589 Irritable bowel syndrome without diarrhea: Secondary | ICD-10-CM | POA: Diagnosis not present

## 2020-01-03 DIAGNOSIS — K580 Irritable bowel syndrome with diarrhea: Secondary | ICD-10-CM | POA: Diagnosis not present

## 2020-01-03 LAB — CBC WITH DIFFERENTIAL (CANCER CENTER ONLY)
Abs Immature Granulocytes: 0.02 10*3/uL (ref 0.00–0.07)
Basophils Absolute: 0 10*3/uL (ref 0.0–0.1)
Basophils Relative: 1 %
Eosinophils Absolute: 0.1 10*3/uL (ref 0.0–0.5)
Eosinophils Relative: 1 %
HCT: 40.1 % (ref 36.0–46.0)
Hemoglobin: 13.3 g/dL (ref 12.0–15.0)
Immature Granulocytes: 0 %
Lymphocytes Relative: 32 %
Lymphs Abs: 1.8 10*3/uL (ref 0.7–4.0)
MCH: 29.4 pg (ref 26.0–34.0)
MCHC: 33.2 g/dL (ref 30.0–36.0)
MCV: 88.7 fL (ref 80.0–100.0)
Monocytes Absolute: 0.4 10*3/uL (ref 0.1–1.0)
Monocytes Relative: 7 %
Neutro Abs: 3.3 10*3/uL (ref 1.7–7.7)
Neutrophils Relative %: 59 %
Platelet Count: 323 10*3/uL (ref 150–400)
RBC: 4.52 MIL/uL (ref 3.87–5.11)
RDW: 13.9 % (ref 11.5–15.5)
WBC Count: 5.7 10*3/uL (ref 4.0–10.5)
nRBC: 0 % (ref 0.0–0.2)

## 2020-01-03 LAB — CMP (CANCER CENTER ONLY)
ALT: 17 U/L (ref 0–44)
AST: 12 U/L — ABNORMAL LOW (ref 15–41)
Albumin: 3.9 g/dL (ref 3.5–5.0)
Alkaline Phosphatase: 73 U/L (ref 38–126)
Anion gap: 10 (ref 5–15)
BUN: 18 mg/dL (ref 8–23)
CO2: 26 mmol/L (ref 22–32)
Calcium: 9.3 mg/dL (ref 8.9–10.3)
Chloride: 107 mmol/L (ref 98–111)
Creatinine: 0.93 mg/dL (ref 0.44–1.00)
GFR, Est AFR Am: >60 mL/min (ref >60)
GFR, Estimated: >60 mL/min (ref >60)
Glucose, Bld: 126 mg/dL — ABNORMAL HIGH (ref 70–99)
Potassium: 4 mmol/L (ref 3.5–5.1)
Sodium: 143 mmol/L (ref 135–145)
Total Bilirubin: 0.3 mg/dL (ref 0.3–1.2)
Total Protein: 7 g/dL (ref 6.5–8.1)

## 2020-01-04 ENCOUNTER — Telehealth: Payer: Self-pay | Admitting: Hematology

## 2020-01-04 NOTE — Telephone Encounter (Signed)
No los per 2/17. °

## 2020-01-05 ENCOUNTER — Telehealth: Payer: Self-pay | Admitting: Family Medicine

## 2020-01-05 DIAGNOSIS — M810 Age-related osteoporosis without current pathological fracture: Secondary | ICD-10-CM

## 2020-01-05 NOTE — Telephone Encounter (Signed)
Prolia injection for you. Thank you!

## 2020-01-05 NOTE — Telephone Encounter (Signed)
Pt would like a call back from [facility] to go over setting up prolia injection appt.

## 2020-01-05 NOTE — Telephone Encounter (Signed)
Her next Prolia is supposed to be March 11  [phone]  Please advise

## 2020-01-08 ENCOUNTER — Encounter: Payer: Self-pay | Admitting: *Deleted

## 2020-01-09 ENCOUNTER — Telehealth: Payer: Self-pay | Admitting: Family Medicine

## 2020-01-09 NOTE — Telephone Encounter (Signed)
Pt would like to schedule her PROLIA injection for March 11. Pt call back number [phone]

## 2020-01-10 ENCOUNTER — Other Ambulatory Visit: Payer: Medicare Other

## 2020-01-10 ENCOUNTER — Ambulatory Visit: Payer: Medicare Other

## 2020-01-10 NOTE — Telephone Encounter (Signed)
Pt called in again about getting the Prolia Inj. I spoke to [place] and she explained that the web site is down and she is having to do everything by paper but hoping to call pt back before the end of the week. Pt understood. Nothing further

## 2020-01-11 NOTE — Telephone Encounter (Signed)
Insurance verification started for Prolia. Patient may need a Vitamin D level and a Dexa. Okay to order?

## 2020-01-11 NOTE — Progress Notes (Signed)
Location of Breast Cancer: Left Breast  Histology per Pathology Report:  08/28/19 Diagnosis 1. Breast, left, needle core biopsy, 1 o'clock/retroareolar - INVASIVE DUCTAL CARCINOMA, GRADE II. - PERINEURAL INVASION. - SEE MICROSCOPIC DESCRIPTION. 2. Breast, left, needle core biopsy, 3 o'clock retroareolar - DUCTAL PAPILLOMA. - SEE MICROSCOPIC DESCRIPTION.  Receptor Status: ER(100%), PR (50%), Her2-neu (NEG), Ki-(12)  10/06/19 FINAL MICROSCOPIC DIAGNOSIS: A. BREAST, RIGHT, LUMPECTOMY: - Fibrocystic change with a small incidental intraductal papilloma and usual ductal hyperplasia - Biopsy site changes - Negative for carcinoma B. LYMPH NODE, LEFT #1, SENTINEL, BIOPSY: - Metastatic carcinoma to a lymph node (1/1) - Focus of metastatic carcinoma measures 2.5 mm and shows extranodal extension C. LYMPH NODE, LEFT #2, SENTINEL, BIOPSY: - Lymph node, negative for carcinoma (0/1) D. LYMPH NODE, LEFT #3, SENTINEL, BIOPSY: - Lymph node, negative for carcinoma (0/1) E. BREAST, LEFT, LUMPECTOMY: - Invasive ductal carcinoma, grade 2, 1.8 cm - Ductal carcinoma in situ, intermediate grade - Invasive carcinoma focally involves the anterior margin - DCIS is less than 1 mm from the superior margin - Background breast parenchyma with fibrocystic change, usual ductal hyperplasia and intraductal papilloma. - Biopsy site changes  11/06/19 FINAL MICROSCOPIC DIAGNOSIS: A. BREAST, LEFT CENTRAL, RE-EXCISION: - Residual ductal carcinoma in situ, intermediate grade - Margins uninvolved by carcinoma (0.4 cm; superior margin) - Atypical intraductal papilloma - Usual ductal hyperplasia - Benign skin with seborrheic keratosis - Previous surgical site changes - No invasive carcinoma identified   Did patient present with symptoms or was this found on screening mammography?: It was found on a screening mammogram.   Past/Anticipated interventions by surgeon, if any: 10/06/19 PROCEDURE:   Procedure(s): LEFT BREAST LUMPECTOMY  X2 WITH RADIOACTIVE SEED X2  AND DEEP LEFT AXILLARY SENTINEL LYMPH NODE BIOPSY (Left) RIGHT BREAST LUMPECTOMY WITH RADIOACTIVE SEED LOCALIZATION (Right) SURGEON:  Surgeon(s) and Role:    Jovita Kussmaul, MD - Primary  11/06/19 PROCEDURE:  Procedure(s): CENTRAL LUMPECTOMY WITH REEXCISION ANTERIOR MARGIN SURGEON:  Surgeon(s) and Role:    Jovita Kussmaul, MD - Primary   Past/Anticipated interventions by medical oncology, if any: Dr. Burr Medico 01/03/20 PLAN: -Send Rad Onc referral to Dr. Isidore Moos  -Lab and F/u on last week of Radiation    Lymphedema issues, if any:  She denies. She has good arm mobility.   Pain issues, if any:   She denies.   SAFETY ISSUES:  Prior radiation? No  Pacemaker/ICD? No  Possible current pregnancy? No  Is the patient on methotrexate? No  Current Complaints / other details:    BP (!) 168/67 (BP Location: Right Arm, Patient Position: Sitting, Cuff Size: Normal)   Pulse 80   Temp 98.2 F (36.8 C)   Resp 20   Wt 181 lb 12.8 oz (82.5 kg)   SpO2 98%   BMI 29.34 kg/m     Wt Readings from Last 3 Encounters:  01/16/20 181 lb 12.8 oz (82.5 kg)  01/03/20 180 lb 3.2 oz (81.7 kg)  12/20/19 181 lb 12.8 oz (82.5 kg)

## 2020-01-11 NOTE — Telephone Encounter (Signed)
The patient called wanting to talk to [place]. I advised the patient that the web site is down and that we're hoping that the site is up by the end of the week. The patient just don't want to miss getting her injection and said that hopefully she'll her something tomorrow if not she'll wait till Tuesday when Apolonio Schneiders is back at work to call back.

## 2020-01-12 ENCOUNTER — Other Ambulatory Visit: Payer: Self-pay

## 2020-01-12 ENCOUNTER — Other Ambulatory Visit (INDEPENDENT_AMBULATORY_CARE_PROVIDER_SITE_OTHER): Payer: Medicare Other

## 2020-01-12 DIAGNOSIS — E559 Vitamin D deficiency, unspecified: Secondary | ICD-10-CM

## 2020-01-12 LAB — VITAMIN D 25 HYDROXY (VIT D DEFICIENCY, FRACTURES): VITD: 12.35 ng/mL — ABNORMAL LOW (ref 30.00–100.00)

## 2020-01-12 NOTE — Telephone Encounter (Signed)
OK 

## 2020-01-12 NOTE — Telephone Encounter (Signed)
Left message on machine for patient to return our call 

## 2020-01-12 NOTE — Telephone Encounter (Signed)
Spoke with patient lab appointment scheduled. patient is going to call her GYN to find out when her last DOXY was and have a copy faxed.

## 2020-01-15 MED ORDER — VITAMIN D (ERGOCALCIFEROL) 1.25 MG (50000 UNIT) PO CAPS: 50000.0000 [IU] | ORAL_CAPSULE | ORAL | 1 refills | Status: DC

## 2020-01-15 NOTE — Progress Notes (Signed)
Radiation Oncology         [phone] ________________________________  Name: Shelly [name] MRN: [id number]  Date: 01/16/2020  DOB: Mar 19, 1946  Follow-Up Visit Note in person  Outpatient  CC: Eulas Post, MD  Truitt Merle, MD  Diagnosis:      ICD-10-CM   1. Malignant neoplasm of central portion of left breast in female, estrogen receptor positive ([place])  C50.112    Z17.0      Cancer Staging Cancer of central portion of left female breast [facility]) Staging form: Breast, AJCC 8th Edition - Clinical stage from 09/18/2019: Stage IA (cT1b, cN0, cM0, G2, ER+, PR+, HER2-) - Signed by Truitt Merle, MD on 09/18/2019 - Pathologic stage from 10/06/2019: Stage IA (pT1c, pN1a, cM0, G2, ER+, PR+, HER2-) - Signed by Truitt Merle, MD on 10/19/2019   CHIEF COMPLAINT: Here to discuss management of left breast cancer  Narrative:  The patient returns today for follow-up to discuss radiation treatment options. She was seen in consultation on 09/26/2019.     She opted to proceed with left lumpectomy and sentinel lymph node biopsy on date of 10/06/2019 with pathology report revealing: tumor size of 1.8 cm; histology of ductal carcinoma; margin status to invasive disease of positive (anterior) and margin status to in situ disease of less than 1 mm; nodal status of positive (1/3); grade 2.  Mammaprint was ordered on the final surgical sample and revealed the patient as high risk.  She underwent re-excision of the positive margin on 11/06/2019 with pathology showing: residual DCIS, intermediate grade, no invasive carcinoma; margins uninvolved, 0.4 cm.  She attempted treatment with systemic therapy by Dr. Burr Medico: she received one dose of docetaxel on 12/06/2019 but experienced an allergic reaction; she was switched to abraxane but experienced extensive side effects after the first dose.  She is feeling relatively well today and enthusiastic about proceeding with radiation.  She denies any lymphedema at the  surgical sites.        ALLERGIES:  is allergic to taxotere [docetaxel]; abraxane [paclitaxel protein-bound part]; crestor [rosuvastatin calcium]; lipitor [atorvastatin calcium]; nsaids; pravastatin; and sulfonamide derivatives.  Meds: Current Outpatient Medications  Medication Sig Dispense Refill  . acetaminophen (TYLENOL) 500 MG tablet Take 500 mg by mouth every 6 (six) hours as needed for headache.    Marland Kitchen acetaminophen-codeine (TYLENOL #3) 300-30 MG tablet Take 1 tablet by mouth every 4 (four) hours as needed for moderate pain. (Patient taking differently: Take 1 tablet by mouth every 4 (four) hours as needed for moderate pain. ) 15 tablet 0  . bismuth subsalicylate (PEPTO BISMOL) 262 MG/15ML suspension Take 30 mLs by mouth every 6 (six) hours as needed.    . clidinium-chlordiazePOXIDE (LIBRAX) 5-2.5 MG capsule Take 1 capsule by mouth every 8 (eight) hours as needed (bowels). 30 capsule 0  . denosumab (PROLIA) 60 MG/ML SOSY injection Inject 60 mg into the skin every 6 (six) months.    . esomeprazole (NEXIUM) 20 MG capsule Take 20 mg by mouth daily as needed (acid reflux).     Marland Kitchen loratadine (CLARITIN) 10 MG tablet Take 10 mg by mouth daily as needed for allergies.     Marland Kitchen LORazepam (ATIVAN) 0.5 MG tablet Take 1 tablet (0.5 mg total) by mouth every 8 (eight) hours as needed for anxiety. (Patient taking differently: Take 0.5 mg by mouth every 8 (eight) hours as needed for anxiety. ) 20 tablet 0  . traMADol (ULTRAM) 50 MG tablet Take 1-2 tablets (50-100 mg total) by mouth  every 6 (six) hours as needed. 20 tablet 0  . Vitamin D, Ergocalciferol, (DRISDOL) 1.25 MG (50000 UNIT) CAPS capsule Take 1 capsule (50,000 Units total) by mouth every 7 (seven) days. 12 capsule 1  . HYDROcodone-acetaminophen (NORCO/VICODIN) 5-325 MG tablet Take 1-2 tablets by mouth every 6 (six) hours as needed for moderate pain or severe pain. (Patient not taking: Reported on 01/16/2020) 10 tablet 0   No current facility-administered  medications for this encounter.    Physical Findings:  weight is 181 lb 12.8 oz (82.5 kg). Her temperature is 98.2 F (36.8 C). Her blood pressure is 168/67 (abnormal) and her pulse is 80. Her respiration is 20 and oxygen saturation is 98%. .     General: Alert and oriented, in no acute distress  Psychiatric: Judgment and insight are intact. Affect is appropriate. Breast exam reveals satisfactory healing of surgical scars  Lab Findings: Lab Results  Component Value Date   WBC 5.7 01/03/2020   HGB 13.3 01/03/2020   HCT 40.1 01/03/2020   MCV 88.7 01/03/2020   PLT 323 01/03/2020    Radiographic Findings: No results found.  Impression/Plan: Left Breast Cancer  We discussed adjuvant radiotherapy today.  I recommend 6 weeks directed at the left breast and regional lymph nodes in order to reduce the risk of locoregional recurrence by 2/3.  The risks, benefits and side effects of this treatment were discussed in detail.  She understands that radiotherapy is associated with skin irritation and fatigue in the acute setting. Late effects can include cosmetic changes and rare injury to internal organs.  She is enthusiastic about proceeding with treatment. A consent form has been signed and placed in her chart.  Proceed with CT simulation today.  I have entered her prescription and intent in software.  On date of service, in total, I spent 25 minutes on this encounter.   _____________________________________   Eppie Gibson, MD   This document serves as a record of services personally performed by Eppie Gibson, MD. It was created on her behalf by Wilburn Mylar, a trained medical scribe. The creation of this record is based on the scribe's personal observations and the provider's statements to them. This document has been checked and approved by the attending provider.

## 2020-01-16 ENCOUNTER — Ambulatory Visit
Admission: RE | Admit: 2020-01-16 | Discharge: 2020-01-16 | Disposition: A | Discharge status: Home / Self Care | Payer: Medicare Other | Source: Ambulatory Visit | Attending: Radiation Oncology | Admitting: Radiation Oncology

## 2020-01-16 ENCOUNTER — Other Ambulatory Visit: Payer: Self-pay

## 2020-01-16 ENCOUNTER — Encounter: Payer: Self-pay | Admitting: Radiation Oncology

## 2020-01-16 DIAGNOSIS — R5383 Other fatigue: Secondary | ICD-10-CM | POA: Insufficient documentation

## 2020-01-16 DIAGNOSIS — C50112 Malignant neoplasm of central portion of left female breast: Secondary | ICD-10-CM | POA: Diagnosis not present

## 2020-01-16 DIAGNOSIS — R0789 Other chest pain: Secondary | ICD-10-CM | POA: Insufficient documentation

## 2020-01-16 DIAGNOSIS — R079 Chest pain, unspecified: Secondary | ICD-10-CM | POA: Diagnosis not present

## 2020-01-16 DIAGNOSIS — Z170 Estrogen receptor positive status [ER+]: Secondary | ICD-10-CM

## 2020-01-16 DIAGNOSIS — Z79899 Other long term (current) drug therapy: Secondary | ICD-10-CM | POA: Insufficient documentation

## 2020-01-16 DIAGNOSIS — Z510 Encounter for antineoplastic radiation therapy: Secondary | ICD-10-CM | POA: Insufficient documentation

## 2020-01-16 DIAGNOSIS — Z9889 Other specified postprocedural states: Secondary | ICD-10-CM | POA: Diagnosis not present

## 2020-01-17 ENCOUNTER — Ambulatory Visit: Payer: Medicare Other | Admitting: Hematology

## 2020-01-17 ENCOUNTER — Ambulatory Visit: Payer: Medicare Other

## 2020-01-17 ENCOUNTER — Encounter: Payer: Self-pay | Admitting: Family Medicine

## 2020-01-17 ENCOUNTER — Telehealth: Payer: Self-pay | Admitting: Family Medicine

## 2020-01-17 ENCOUNTER — Other Ambulatory Visit: Payer: Medicare Other

## 2020-01-17 ENCOUNTER — Encounter: Payer: Self-pay | Admitting: Radiation Oncology

## 2020-01-17 DIAGNOSIS — Z510 Encounter for antineoplastic radiation therapy: Secondary | ICD-10-CM | POA: Diagnosis not present

## 2020-01-17 DIAGNOSIS — C50112 Malignant neoplasm of central portion of left female breast: Secondary | ICD-10-CM | POA: Diagnosis not present

## 2020-01-17 NOTE — Telephone Encounter (Signed)
Juliann Pulse at [facility] is requesting to speak to you regarding a refill for Vitamin D 50000 Unit that was sent to the pharmacy today. Per Juliann Pulse, pt mentioned that she might not be able to tolerate the vitamins. Thanks   Juliann Pulse [phone]

## 2020-01-17 NOTE — Telephone Encounter (Signed)
Pt would like to know if her Prolia injection was approved? Thanks

## 2020-01-17 NOTE — Telephone Encounter (Signed)
If deductible is meet, then no cost for Prolia. Patient is aware and injection scheduled.

## 2020-01-18 NOTE — Telephone Encounter (Signed)
Returned call to [place] and she stated that patient had concerns about taking vitamin d because last time it bothered her stomach. Patient was wanting to know if PCP still wanted her to take the vitamin d and questioned whether or not she should get Prolia injection in 01/25/20 because she start treatment for breast cancer on 01/22/20. Please advise

## 2020-01-18 NOTE — Telephone Encounter (Signed)
I would hold on the Prolia and not get any further injections until she is finished with the chemotherapy.  Would also hold the Vit D for now if upsetting her stomach.

## 2020-01-18 NOTE — Telephone Encounter (Signed)
Patient informed and verbalized understanding. Patient stated that she isn't going to do chemo because she couldn't tolerate the chemicals, so she is going to start radiation Thursday, 01/25/20, 5 days a week for 6 weeks and now wants to know if she should still hold off on the Prolia. Please advise

## 2020-01-21 NOTE — Telephone Encounter (Signed)
I would still hold off until she completes the radiation.

## 2020-01-22 ENCOUNTER — Telehealth: Payer: Self-pay | Admitting: Hematology

## 2020-01-22 ENCOUNTER — Encounter: Payer: Self-pay | Admitting: *Deleted

## 2020-01-22 NOTE — Telephone Encounter (Signed)
Scheduled appt per 3/8 sch message - mailed reminder letter with appt date and time

## 2020-01-22 NOTE — Telephone Encounter (Signed)
FYI Spoke with patient and she stated that it really wasn't any problems with the Prolia and her concern was if the high potency vitamin d cause problems with her IBS. She stated that she doesn't want to stop Prolia and she talked to her Oncologist and was told it was no problem and she should get the Prolia. Patient stated that she will continue to get her Prolia.

## 2020-01-24 ENCOUNTER — Other Ambulatory Visit: Payer: Self-pay

## 2020-01-24 ENCOUNTER — Ambulatory Visit: Admission: RE | Admit: 2020-01-24 | Payer: Medicare Other | Source: Ambulatory Visit | Admitting: Radiation Oncology

## 2020-01-24 DIAGNOSIS — C50112 Malignant neoplasm of central portion of left female breast: Secondary | ICD-10-CM | POA: Diagnosis not present

## 2020-01-24 DIAGNOSIS — Z510 Encounter for antineoplastic radiation therapy: Secondary | ICD-10-CM | POA: Diagnosis not present

## 2020-01-25 ENCOUNTER — Ambulatory Visit
Admission: RE | Admit: 2020-01-25 | Discharge: 2020-01-25 | Disposition: A | Discharge status: Home / Self Care | Payer: Medicare Other | Source: Ambulatory Visit | Attending: Radiation Oncology | Admitting: Radiation Oncology

## 2020-01-25 ENCOUNTER — Other Ambulatory Visit: Payer: Self-pay

## 2020-01-25 DIAGNOSIS — C50112 Malignant neoplasm of central portion of left female breast: Secondary | ICD-10-CM | POA: Diagnosis not present

## 2020-01-25 DIAGNOSIS — Z510 Encounter for antineoplastic radiation therapy: Secondary | ICD-10-CM | POA: Diagnosis not present

## 2020-01-25 DIAGNOSIS — Z170 Estrogen receptor positive status [ER+]: Secondary | ICD-10-CM

## 2020-01-25 MED ORDER — ALRA NON-METALLIC DEODORANT (RAD-ONC)
1.0000 "application " | Freq: Once | TOPICAL | Status: AC
  Administered 2020-01-25: 1 via TOPICAL

## 2020-01-25 MED ORDER — SONAFINE EX EMUL
1.0000 "application " | Freq: Once | CUTANEOUS | Status: AC
  Administered 2020-01-25: 1 via TOPICAL

## 2020-01-25 NOTE — Progress Notes (Signed)
Pt here for patient teaching.  Pt given Radiation and You booklet, skin care instructions, Alra deodorant and Sonafine.  Reviewed areas of pertinence such as fatigue, skin changes, breast tenderness, breast swelling, earaches and taste changes . Pt able to give teach back of to pat skin, use unscented/gentle soap and drink plenty of water,apply Sonafine bid, avoid applying anything to skin within 4 hours of treatment and avoid wearing an under wire bra. Pt verbalizes understanding of information given and will contact nursing with any questions or concerns.     Http://rtanswers.org/treatmentinformation/whattoexpect/index

## 2020-01-26 ENCOUNTER — Other Ambulatory Visit: Payer: Self-pay

## 2020-01-26 ENCOUNTER — Ambulatory Visit
Admission: RE | Admit: 2020-01-26 | Discharge: 2020-01-26 | Disposition: A | Discharge status: Home / Self Care | Payer: Medicare Other | Source: Ambulatory Visit | Attending: Radiation Oncology | Admitting: Radiation Oncology

## 2020-01-26 DIAGNOSIS — Z510 Encounter for antineoplastic radiation therapy: Secondary | ICD-10-CM | POA: Diagnosis not present

## 2020-01-26 DIAGNOSIS — C50112 Malignant neoplasm of central portion of left female breast: Secondary | ICD-10-CM | POA: Diagnosis not present

## 2020-01-29 ENCOUNTER — Ambulatory Visit
Admission: RE | Admit: 2020-01-29 | Discharge: 2020-01-29 | Disposition: A | Discharge status: Home / Self Care | Payer: Medicare Other | Source: Ambulatory Visit | Attending: Radiation Oncology | Admitting: Radiation Oncology

## 2020-01-29 ENCOUNTER — Ambulatory Visit: Payer: Self-pay

## 2020-01-29 ENCOUNTER — Other Ambulatory Visit: Payer: Self-pay

## 2020-01-29 DIAGNOSIS — Z510 Encounter for antineoplastic radiation therapy: Secondary | ICD-10-CM | POA: Diagnosis not present

## 2020-01-29 DIAGNOSIS — C50112 Malignant neoplasm of central portion of left female breast: Secondary | ICD-10-CM | POA: Diagnosis not present

## 2020-01-30 ENCOUNTER — Telehealth: Payer: Self-pay | Admitting: *Deleted

## 2020-01-30 ENCOUNTER — Other Ambulatory Visit: Payer: Self-pay

## 2020-01-30 ENCOUNTER — Ambulatory Visit (INDEPENDENT_AMBULATORY_CARE_PROVIDER_SITE_OTHER): Payer: Medicare Other | Admitting: *Deleted

## 2020-01-30 ENCOUNTER — Ambulatory Visit
Admission: RE | Admit: 2020-01-30 | Discharge: 2020-01-30 | Disposition: A | Discharge status: Home / Self Care | Payer: Medicare Other | Source: Ambulatory Visit | Attending: Radiation Oncology | Admitting: Radiation Oncology

## 2020-01-30 DIAGNOSIS — M810 Age-related osteoporosis without current pathological fracture: Secondary | ICD-10-CM

## 2020-01-30 DIAGNOSIS — C50112 Malignant neoplasm of central portion of left female breast: Secondary | ICD-10-CM | POA: Diagnosis not present

## 2020-01-30 DIAGNOSIS — Z510 Encounter for antineoplastic radiation therapy: Secondary | ICD-10-CM | POA: Diagnosis not present

## 2020-01-30 MED ORDER — DENOSUMAB 60 MG/ML ~~LOC~~ SOSY
60.0000 mg | PREFILLED_SYRINGE | Freq: Once | SUBCUTANEOUS | Status: AC
  Administered 2020-01-30: 60 mg via SUBCUTANEOUS

## 2020-01-30 MED ORDER — LORAZEPAM 0.5 MG PO TABS: 0.5000 mg | ORAL_TABLET | Freq: Three times a day (TID) | ORAL | 0 refills | Status: DC | PRN

## 2020-01-30 NOTE — Progress Notes (Signed)
Per orders of Dr. Burchette, injection of Prolia given by Jaciel Diem. Patient tolerated injection well. 

## 2020-01-30 NOTE — Telephone Encounter (Signed)
I refilled the Lorazepam- but avoid regular use  Vit D does come in liquid form 5,000 IU/ml and this is OTC.

## 2020-01-30 NOTE — Telephone Encounter (Signed)
Patient would like to know if she can have a refill of LORazepam (ATIVAN) 0.5 MG tablet.     Patient would also like to know if Vitamin D Rx can be an injectable or in liquid form due to her GI problems?

## 2020-01-31 ENCOUNTER — Other Ambulatory Visit: Payer: Self-pay

## 2020-01-31 ENCOUNTER — Ambulatory Visit
Admission: RE | Admit: 2020-01-31 | Discharge: 2020-01-31 | Disposition: A | Discharge status: Home / Self Care | Payer: Medicare Other | Source: Ambulatory Visit | Attending: Radiation Oncology | Admitting: Radiation Oncology

## 2020-01-31 DIAGNOSIS — Z510 Encounter for antineoplastic radiation therapy: Secondary | ICD-10-CM | POA: Diagnosis not present

## 2020-01-31 DIAGNOSIS — C50112 Malignant neoplasm of central portion of left female breast: Secondary | ICD-10-CM | POA: Diagnosis not present

## 2020-01-31 NOTE — Telephone Encounter (Signed)
Patient is aware 

## 2020-02-01 ENCOUNTER — Other Ambulatory Visit: Payer: Self-pay

## 2020-02-01 ENCOUNTER — Ambulatory Visit
Admission: RE | Admit: 2020-02-01 | Discharge: 2020-02-01 | Disposition: A | Discharge status: Home / Self Care | Payer: Medicare Other | Source: Ambulatory Visit | Attending: Radiation Oncology | Admitting: Radiation Oncology

## 2020-02-01 DIAGNOSIS — Z510 Encounter for antineoplastic radiation therapy: Secondary | ICD-10-CM | POA: Diagnosis not present

## 2020-02-01 DIAGNOSIS — C50112 Malignant neoplasm of central portion of left female breast: Secondary | ICD-10-CM | POA: Diagnosis not present

## 2020-02-02 ENCOUNTER — Other Ambulatory Visit: Payer: Self-pay

## 2020-02-02 ENCOUNTER — Ambulatory Visit
Admission: RE | Admit: 2020-02-02 | Discharge: 2020-02-02 | Disposition: A | Discharge status: Home / Self Care | Payer: Medicare Other | Source: Ambulatory Visit | Attending: Radiation Oncology | Admitting: Radiation Oncology

## 2020-02-02 DIAGNOSIS — C50112 Malignant neoplasm of central portion of left female breast: Secondary | ICD-10-CM | POA: Diagnosis not present

## 2020-02-02 DIAGNOSIS — Z510 Encounter for antineoplastic radiation therapy: Secondary | ICD-10-CM | POA: Diagnosis not present

## 2020-02-05 ENCOUNTER — Ambulatory Visit
Admission: RE | Admit: 2020-02-05 | Discharge: 2020-02-05 | Disposition: A | Discharge status: Home / Self Care | Payer: Medicare Other | Source: Ambulatory Visit | Attending: Radiation Oncology | Admitting: Radiation Oncology

## 2020-02-05 ENCOUNTER — Other Ambulatory Visit: Payer: Self-pay

## 2020-02-05 DIAGNOSIS — Z510 Encounter for antineoplastic radiation therapy: Secondary | ICD-10-CM | POA: Diagnosis not present

## 2020-02-05 DIAGNOSIS — C50112 Malignant neoplasm of central portion of left female breast: Secondary | ICD-10-CM | POA: Diagnosis not present

## 2020-02-06 ENCOUNTER — Ambulatory Visit
Admission: RE | Admit: 2020-02-06 | Discharge: 2020-02-06 | Disposition: A | Discharge status: Home / Self Care | Payer: Medicare Other | Source: Ambulatory Visit | Attending: Radiation Oncology | Admitting: Radiation Oncology

## 2020-02-06 ENCOUNTER — Other Ambulatory Visit: Payer: Self-pay

## 2020-02-06 DIAGNOSIS — Z510 Encounter for antineoplastic radiation therapy: Secondary | ICD-10-CM | POA: Diagnosis not present

## 2020-02-06 DIAGNOSIS — C50112 Malignant neoplasm of central portion of left female breast: Secondary | ICD-10-CM | POA: Diagnosis not present

## 2020-02-07 ENCOUNTER — Ambulatory Visit
Admission: RE | Admit: 2020-02-07 | Discharge: 2020-02-07 | Disposition: A | Discharge status: Home / Self Care | Payer: Medicare Other | Source: Ambulatory Visit | Attending: Radiation Oncology | Admitting: Radiation Oncology

## 2020-02-07 ENCOUNTER — Other Ambulatory Visit: Payer: Self-pay

## 2020-02-07 DIAGNOSIS — C50112 Malignant neoplasm of central portion of left female breast: Secondary | ICD-10-CM | POA: Diagnosis not present

## 2020-02-07 DIAGNOSIS — Z510 Encounter for antineoplastic radiation therapy: Secondary | ICD-10-CM | POA: Diagnosis not present

## 2020-02-08 ENCOUNTER — Other Ambulatory Visit: Payer: Self-pay

## 2020-02-08 ENCOUNTER — Ambulatory Visit
Admission: RE | Admit: 2020-02-08 | Discharge: 2020-02-08 | Disposition: A | Discharge status: Home / Self Care | Payer: Medicare Other | Source: Ambulatory Visit | Attending: Radiation Oncology | Admitting: Radiation Oncology

## 2020-02-08 DIAGNOSIS — C50112 Malignant neoplasm of central portion of left female breast: Secondary | ICD-10-CM | POA: Diagnosis not present

## 2020-02-08 DIAGNOSIS — Z510 Encounter for antineoplastic radiation therapy: Secondary | ICD-10-CM | POA: Diagnosis not present

## 2020-02-09 ENCOUNTER — Ambulatory Visit
Admission: RE | Admit: 2020-02-09 | Discharge: 2020-02-09 | Disposition: A | Discharge status: Home / Self Care | Payer: Medicare Other | Source: Ambulatory Visit | Attending: Radiation Oncology | Admitting: Radiation Oncology

## 2020-02-09 ENCOUNTER — Other Ambulatory Visit: Payer: Self-pay

## 2020-02-09 DIAGNOSIS — C50112 Malignant neoplasm of central portion of left female breast: Secondary | ICD-10-CM | POA: Diagnosis not present

## 2020-02-09 DIAGNOSIS — Z510 Encounter for antineoplastic radiation therapy: Secondary | ICD-10-CM | POA: Diagnosis not present

## 2020-02-12 ENCOUNTER — Other Ambulatory Visit: Payer: Self-pay

## 2020-02-12 ENCOUNTER — Ambulatory Visit
Admission: RE | Admit: 2020-02-12 | Discharge: 2020-02-12 | Disposition: A | Discharge status: Home / Self Care | Payer: Medicare Other | Source: Ambulatory Visit | Attending: Radiation Oncology | Admitting: Radiation Oncology

## 2020-02-12 ENCOUNTER — Emergency Department (HOSPITAL_COMMUNITY): Payer: Medicare Other

## 2020-02-12 ENCOUNTER — Emergency Department (HOSPITAL_COMMUNITY)
Admission: EM | Admit: 2020-02-12 | Discharge: 2020-02-12 | Disposition: A | Discharge status: Home / Self Care | Payer: Medicare Other | Source: Home / Self Care | Attending: Emergency Medicine | Admitting: Emergency Medicine

## 2020-02-12 ENCOUNTER — Encounter (HOSPITAL_COMMUNITY): Payer: Self-pay | Admitting: *Deleted

## 2020-02-12 DIAGNOSIS — R0789 Other chest pain: Secondary | ICD-10-CM

## 2020-02-12 DIAGNOSIS — R079 Chest pain, unspecified: Secondary | ICD-10-CM | POA: Diagnosis not present

## 2020-02-12 DIAGNOSIS — Z79899 Other long term (current) drug therapy: Secondary | ICD-10-CM | POA: Insufficient documentation

## 2020-02-12 DIAGNOSIS — Z510 Encounter for antineoplastic radiation therapy: Secondary | ICD-10-CM | POA: Diagnosis not present

## 2020-02-12 DIAGNOSIS — C50112 Malignant neoplasm of central portion of left female breast: Secondary | ICD-10-CM | POA: Diagnosis not present

## 2020-02-12 DIAGNOSIS — Z87891 Personal history of nicotine dependence: Secondary | ICD-10-CM | POA: Insufficient documentation

## 2020-02-12 LAB — CBC
HCT: 42.9 % (ref 36.0–46.0)
Hemoglobin: 13.9 g/dL (ref 12.0–15.0)
MCH: 29.8 pg (ref 26.0–34.0)
MCHC: 32.4 g/dL (ref 30.0–36.0)
MCV: 91.9 fL (ref 80.0–100.0)
Platelets: 253 10*3/uL (ref 150–400)
RBC: 4.67 MIL/uL (ref 3.87–5.11)
RDW: 13.6 % (ref 11.5–15.5)
WBC: 5 10*3/uL (ref 4.0–10.5)
nRBC: 0 % (ref 0.0–0.2)

## 2020-02-12 LAB — BASIC METABOLIC PANEL
Anion gap: 7 (ref 5–15)
BUN: 19 mg/dL (ref 8–23)
CO2: 27 mmol/L (ref 22–32)
Calcium: 8.8 mg/dL — ABNORMAL LOW (ref 8.9–10.3)
Chloride: 106 mmol/L (ref 98–111)
Creatinine, Ser: 0.83 mg/dL (ref 0.44–1.00)
GFR calc Af Amer: >60 mL/min (ref >60)
GFR calc non Af Amer: >60 mL/min (ref >60)
Glucose, Bld: 110 mg/dL — ABNORMAL HIGH (ref 70–99)
Potassium: 4.2 mmol/L (ref 3.5–5.1)
Sodium: 140 mmol/L (ref 135–145)

## 2020-02-12 LAB — TROPONIN I (HIGH SENSITIVITY)
Troponin I (High Sensitivity): 3 ng/L (ref <18)
Troponin I (High Sensitivity): 3 ng/L (ref <18)

## 2020-02-12 MED ORDER — SODIUM CHLORIDE 0.9% FLUSH
3.0000 mL | Freq: Once | INTRAVENOUS | Status: AC
  Administered 2020-02-12: 3 mL via INTRAVENOUS

## 2020-02-12 MED ORDER — SODIUM CHLORIDE (PF) 0.9 % IJ SOLN
INTRAMUSCULAR | Status: AC
  Filled 2020-02-12: qty 50

## 2020-02-12 MED ORDER — IOHEXOL 350 MG/ML SOLN
100.0000 mL | Freq: Once | INTRAVENOUS | Status: AC | PRN
  Administered 2020-02-12: 100 mL via INTRAVENOUS

## 2020-02-12 NOTE — Progress Notes (Signed)
During exam with Dr. Isidore Moos patient began complaining of intense right sided chest pain that radiated down her right arm. Per Dr. Isidore Moos: patient became very diaphoretic and anxious. Vital signs stable (BP 160/68-LA, HR: 75, T 97.9, O2: 99%) though patient hypertensive. Called [facility] and spoke with Patty-charge RN. Update given and received verbal confirmation from charge RN that patient could be brought to room 3 in ED.   Patient assisted to ED via wheelchair and report given to Merle Tai-RN

## 2020-02-12 NOTE — ED Provider Notes (Signed)
[facility] DEPT Provider Note   CSN: [id number] Arrival date & time: 02/12/20  1122     History Chief Complaint  Patient presents with  . Chest Pain    Shelly [name] is a 74 y.o. female.  74 year old female with history of breast cancer presents after having acute onset of right-sided chest pain with signs of approximately 10 minutes.  Pain did go to her back.  Was associated diaphoresis.  Patient states that she did become panicky.  Slight dyspnea.  Nausea but no vomiting.  Pain resolved on its own.  No recent fever cough or congestion.  No prior history of same        Past Medical History:  Diagnosis Date  . Arthritis    wrist, shoulder   . Cancer ([place]) 08/2019   left breast IDC  . Diverticulitis   . Endometrial polyp   . Family history of breast cancer   . GERD (gastroesophageal reflux disease)    occasional - diet controlled  . Hx of adenomatous colonic polyps   . Hyperlipidemia   . IBS (irritable bowel syndrome)   . Missed abortion    x 1 - resolved - no surgery  . Osteoporosis   . SVD (spontaneous vaginal delivery)    x 1  . Transient vision disturbance 2019   was evaluated by ED, no findings  . Vitamin D deficiency     Patient Active Problem List   Diagnosis Date Noted  . Family history of breast cancer   . Cancer of central portion of left female breast ([place]) 09/18/2019  . Prediabetes 04/06/2017  . Osteoporosis 06/12/2015  . IBS (irritable bowel syndrome) 08/21/2014  . Hyperlipidemia 08/05/2011  . ABDOMINAL PAIN RIGHT UPPER QUADRANT 10/30/2008  . ABDOMINAL BLOATING 10/25/2008  . GERD 06/04/2008  . DIVERTICULOSIS OF COLON 06/04/2008    Past Surgical History:  Procedure Laterality Date  . BREAST LUMPECTOMY WITH RADIOACTIVE SEED AND SENTINEL LYMPH NODE BIOPSY Left 10/06/2019   Procedure: LEFT BREAST LUMPECTOMY  X2 WITH RADIOACTIVE SEED X2  AND SENTINEL LYMPH NODE BIOPSY;  Surgeon: Jovita Kussmaul, MD;  Location:  [facility];  Service: General;  Laterality: Left;  . BREAST LUMPECTOMY WITH RADIOACTIVE SEED LOCALIZATION Right 10/06/2019   Procedure: RIGHT BREAST LUMPECTOMY WITH RADIOACTIVE SEED LOCALIZATION;  Surgeon: Jovita Kussmaul, MD;  Location: [facility];  Service: General;  Laterality: Right;  . COLONOSCOPY    . DIAGNOSTIC LAPAROSCOPY     cysts  . DILATATION & CURETTAGE/HYSTEROSCOPY WITH TRUECLEAR N/A 10/31/2013   Procedure: DILATATION & CURETTAGE/HYSTEROSCOPY WITH TRUCLEAR;  Surgeon: Marylynn Pearson, MD;  Location: [facility];  Service: Gynecology;  Laterality: N/A;  . DILATION AND CURETTAGE OF UTERUS    . MASS EXCISION Left 11/06/2019   Procedure: EXCISION NIPPLE AND AREOLA  MARGIN LEFT BREAST;  Surgeon: Jovita Kussmaul, MD;  Location: [facility];  Service: General;  Laterality: Left;  . TONSILLECTOMY  1952  . WISDOM TOOTH EXTRACTION       OB History    Gravida  2   Para  1   Term      Preterm      AB  1   Living  1     SAB  1   TAB      Ectopic      Multiple      Live Births              Family  History  Problem Relation Age of Onset  . Hyperlipidemia Mother   . Hypertension Mother   . Diabetes Mother        type ll  . Leukemia Mother 74  . Lung cancer Father   . Hyperlipidemia Father   . Breast cancer Cousin 30       maternal first couin  . Heart disease Other   . Hypertension Other   . Cancer Other   . Depression Brother   . Breast cancer Maternal Aunt 31  . Uterine cancer Paternal Grandmother   . Breast cancer Maternal Aunt 107  . Lung cancer Maternal Grandfather   . Cancer Maternal Grandfather        gastric cancer?  . Cancer Maternal Uncle        unknown  . Brain cancer Paternal Uncle   . Throat cancer Paternal Uncle     Social History   Tobacco Use  . Smoking status: Former Smoker    Packs/day: 0.10    Years: 20.00    Pack years: 2.00    Types: Cigarettes    Quit date: 05/15/2007    Years since  quitting: 12.7  . Smokeless tobacco: Never Used  Substance Use Topics  . Alcohol use: Yes    Alcohol/week: 14.0 standard drinks    Types: 14 Glasses of wine per week    Comment: 2 glasses wine every evening  . Drug use: No    Home Medications Prior to Admission medications   Medication Sig Start Date End Date Taking? Authorizing Provider  acetaminophen (TYLENOL) 500 MG tablet Take 500 mg by mouth every 6 (six) hours as needed for headache.    [provider]  acetaminophen-codeine (TYLENOL #3) 300-30 MG tablet Take 1 tablet by mouth every 4 (four) hours as needed for moderate pain. Patient taking differently: Take 1 tablet by mouth every 4 (four) hours as needed for moderate pain.  10/19/19   Truitt Merle, MD  bismuth subsalicylate (PEPTO BISMOL) 262 MG/15ML suspension Take 30 mLs by mouth every 6 (six) hours as needed.    [provider]  clidinium-chlordiazePOXIDE (LIBRAX) 5-2.5 MG capsule Take 1 capsule by mouth every 8 (eight) hours as needed (bowels). 12/06/19   Truitt Merle, MD  denosumab (PROLIA) 60 MG/ML SOSY injection Inject 60 mg into the skin every 6 (six) months.    [provider]  esomeprazole (NEXIUM) 20 MG capsule Take 20 mg by mouth daily as needed (acid reflux).     [provider]  HYDROcodone-acetaminophen (NORCO/VICODIN) 5-325 MG tablet Take 1-2 tablets by mouth every 6 (six) hours as needed for moderate pain or severe pain. Patient not taking: Reported on 01/16/2020 11/06/19   Autumn Messing III, MD  loratadine (CLARITIN) 10 MG tablet Take 10 mg by mouth daily as needed for allergies.     [provider]  LORazepam (ATIVAN) 0.5 MG tablet Take 1 tablet (0.5 mg total) by mouth every 8 (eight) hours as needed for anxiety. 01/30/20   Burchette, Alinda Sierras, MD  traMADol (ULTRAM) 50 MG tablet Take 1-2 tablets (50-100 mg total) by mouth every 6 (six) hours as needed. 10/06/19   Jovita Kussmaul, MD  Vitamin D, Ergocalciferol, (DRISDOL) 1.25 MG (50000  UNIT) CAPS capsule Take 1 capsule (50,000 Units total) by mouth every 7 (seven) days. 01/15/20   Burchette, Alinda Sierras, MD  omeprazole (PRILOSEC) 40 MG capsule Take 40 mg by mouth daily.    01/25/12  [provider]  pravastatin (  PRAVACHOL) 40 MG tablet Take 1 tablet (40 mg total) by mouth every evening. 09/18/11 01/25/12  Burchette, Alinda Sierras, MD  prochlorperazine (COMPAZINE) 10 MG tablet Take 1 tablet (10 mg total) by mouth every 6 (six) hours as needed (Nausea or vomiting). 11/24/19 12/12/19  Truitt Merle, MD    Allergies    Taxotere [docetaxel], Abraxane [paclitaxel protein-bound part], Crestor [rosuvastatin calcium], Lipitor [atorvastatin calcium], Nsaids, Pravastatin, and Sulfonamide derivatives  Review of Systems   Review of Systems  All other systems reviewed and are negative.   Physical Exam Updated Vital Signs BP (!) 173/79   Pulse 73   Temp 98.3 F (36.8 C) (Oral)   Resp (!) 21   Ht 1.651 m (5\' 5" )   Wt 80.7 kg   SpO2 99%   BMI 29.62 kg/m   Physical Exam Vitals and nursing note reviewed.  Constitutional:      General: She is not in acute distress.    Appearance: Normal appearance. She is well-developed. She is not toxic-appearing.  HENT:     Head: Normocephalic and atraumatic.  Eyes:     General: Lids are normal.     Conjunctiva/sclera: Conjunctivae normal.     Pupils: Pupils are equal, round, and reactive to light.  Neck:     Thyroid: No thyroid mass.     Trachea: No tracheal deviation.  Cardiovascular:     Rate and Rhythm: Normal rate and regular rhythm.     Heart sounds: Normal heart sounds. No murmur. No gallop.   Pulmonary:     Effort: Pulmonary effort is normal. No respiratory distress.     Breath sounds: Normal breath sounds. No stridor. No decreased breath sounds, wheezing, rhonchi or rales.  Abdominal:     General: Bowel sounds are normal. There is no distension.     Palpations: Abdomen is soft.     Tenderness: There is no abdominal tenderness. There is  no rebound.  Musculoskeletal:        General: No tenderness. Normal range of motion.     Cervical back: Normal range of motion and neck supple.  Skin:    General: Skin is warm and dry.     Findings: No abrasion or rash.  Neurological:     Mental Status: She is alert and oriented to person, place, and time.     GCS: GCS eye subscore is 4. GCS verbal subscore is 5. GCS motor subscore is 6.     Cranial Nerves: No cranial nerve deficit.     Sensory: No sensory deficit.  Psychiatric:        Speech: Speech normal.        Behavior: Behavior normal.     ED Results / Procedures / Treatments   Labs (all labs ordered are listed, but only abnormal results are displayed) Labs Reviewed  BASIC METABOLIC PANEL  CBC  TROPONIN I (HIGH SENSITIVITY)    EKG None ED ECG REPORT   Date: 02/12/2020  Rate: 69  Rhythm: normal sinus rhythm  QRS Axis: normal  Intervals: normal  ST/T Wave abnormalities: normal  Conduction Disutrbances:none  Narrative Interpretation:   Old EKG Reviewed: none available  I have personally reviewed the EKG tracing and agree with the computerized printout as noted.  Radiology No results found.  Procedures Procedures (including critical care time)  Medications Ordered in ED Medications  sodium chloride flush (NS) 0.9 % injection 3 mL (has no administration in time range)    ED Course  I have reviewed  the triage vital signs and the nursing notes.  Pertinent labs & imaging results that were available during my care of the patient were reviewed by me and considered in my medical decision making (see chart for details).    MDM Rules/Calculators/A&P                      Patient had troponins here x2.  CT of the chest without pulmonary embolus.  Patient states that her pain started with a sharpness in her back and she became anxious.  Has had no recurrence of her pain here.  Low suspicion for this being ACS.  Will discharge home Final Clinical Impression(s) /  ED Diagnoses Final diagnoses:  None    Rx / DC Orders ED Discharge Orders    None       Lacretia Leigh, MD 02/12/20 1531

## 2020-02-12 NOTE — ED Triage Notes (Signed)
Pt reports sudden onset right chest pain that began about 15 minutes prior to arrival to ED. Pt stated that the pain radiated down her right arm. Dr. Isidore Moos stated that pt was diaphoretic at the time.   Pt was sitting at the cancer center getting her left breast examined post radiation treatment. Initially the pt stated that pain did not feel like anything she had experienced before but then the pain started to feel like her reflux.   Pt has left breast CA. Hx reflux in which pt takes medication for in the evening.  VS from cancer center: BP: 160/68, HR: 71, RR: 18, temp: 97.9, 98% RA.

## 2020-02-13 ENCOUNTER — Other Ambulatory Visit: Payer: Self-pay

## 2020-02-13 ENCOUNTER — Ambulatory Visit
Admission: RE | Admit: 2020-02-13 | Discharge: 2020-02-13 | Disposition: A | Discharge status: Home / Self Care | Payer: Medicare Other | Source: Ambulatory Visit | Attending: Radiation Oncology | Admitting: Radiation Oncology

## 2020-02-13 DIAGNOSIS — C50112 Malignant neoplasm of central portion of left female breast: Secondary | ICD-10-CM | POA: Diagnosis not present

## 2020-02-13 DIAGNOSIS — Z510 Encounter for antineoplastic radiation therapy: Secondary | ICD-10-CM | POA: Diagnosis not present

## 2020-02-14 ENCOUNTER — Ambulatory Visit
Admission: RE | Admit: 2020-02-14 | Discharge: 2020-02-14 | Disposition: A | Discharge status: Home / Self Care | Payer: Medicare Other | Source: Ambulatory Visit | Attending: Radiation Oncology | Admitting: Radiation Oncology

## 2020-02-14 ENCOUNTER — Other Ambulatory Visit: Payer: Self-pay

## 2020-02-14 DIAGNOSIS — C50112 Malignant neoplasm of central portion of left female breast: Secondary | ICD-10-CM | POA: Diagnosis not present

## 2020-02-14 DIAGNOSIS — Z510 Encounter for antineoplastic radiation therapy: Secondary | ICD-10-CM | POA: Diagnosis not present

## 2020-02-15 ENCOUNTER — Ambulatory Visit
Admission: RE | Admit: 2020-02-15 | Discharge: 2020-02-15 | Disposition: A | Discharge status: Home / Self Care | Payer: Medicare Other | Source: Ambulatory Visit | Attending: Radiation Oncology | Admitting: Radiation Oncology

## 2020-02-15 ENCOUNTER — Other Ambulatory Visit: Payer: Self-pay

## 2020-02-15 DIAGNOSIS — R0789 Other chest pain: Secondary | ICD-10-CM | POA: Insufficient documentation

## 2020-02-15 DIAGNOSIS — C50112 Malignant neoplasm of central portion of left female breast: Secondary | ICD-10-CM | POA: Insufficient documentation

## 2020-02-15 DIAGNOSIS — Z510 Encounter for antineoplastic radiation therapy: Secondary | ICD-10-CM | POA: Diagnosis not present

## 2020-02-16 ENCOUNTER — Ambulatory Visit
Admission: RE | Admit: 2020-02-16 | Discharge: 2020-02-16 | Disposition: A | Discharge status: Home / Self Care | Payer: Medicare Other | Source: Ambulatory Visit | Attending: Radiation Oncology | Admitting: Radiation Oncology

## 2020-02-16 ENCOUNTER — Other Ambulatory Visit: Payer: Self-pay

## 2020-02-16 DIAGNOSIS — C50112 Malignant neoplasm of central portion of left female breast: Secondary | ICD-10-CM | POA: Diagnosis not present

## 2020-02-16 DIAGNOSIS — R0789 Other chest pain: Secondary | ICD-10-CM | POA: Diagnosis not present

## 2020-02-16 DIAGNOSIS — Z510 Encounter for antineoplastic radiation therapy: Secondary | ICD-10-CM | POA: Diagnosis not present

## 2020-02-19 ENCOUNTER — Other Ambulatory Visit: Payer: Self-pay

## 2020-02-19 ENCOUNTER — Ambulatory Visit
Admission: RE | Admit: 2020-02-19 | Discharge: 2020-02-19 | Disposition: A | Discharge status: Home / Self Care | Payer: Medicare Other | Source: Ambulatory Visit | Attending: Radiation Oncology | Admitting: Radiation Oncology

## 2020-02-19 DIAGNOSIS — Z510 Encounter for antineoplastic radiation therapy: Secondary | ICD-10-CM | POA: Diagnosis not present

## 2020-02-19 DIAGNOSIS — R0789 Other chest pain: Secondary | ICD-10-CM | POA: Diagnosis not present

## 2020-02-19 DIAGNOSIS — C50112 Malignant neoplasm of central portion of left female breast: Secondary | ICD-10-CM | POA: Diagnosis not present

## 2020-02-20 ENCOUNTER — Ambulatory Visit
Admission: RE | Admit: 2020-02-20 | Discharge: 2020-02-20 | Disposition: A | Discharge status: Home / Self Care | Payer: Medicare Other | Source: Ambulatory Visit | Attending: Radiation Oncology | Admitting: Radiation Oncology

## 2020-02-20 ENCOUNTER — Other Ambulatory Visit: Payer: Self-pay

## 2020-02-20 ENCOUNTER — Encounter: Payer: Self-pay | Admitting: General Practice

## 2020-02-20 DIAGNOSIS — C50112 Malignant neoplasm of central portion of left female breast: Secondary | ICD-10-CM | POA: Diagnosis not present

## 2020-02-20 DIAGNOSIS — R0789 Other chest pain: Secondary | ICD-10-CM | POA: Diagnosis not present

## 2020-02-20 DIAGNOSIS — Z510 Encounter for antineoplastic radiation therapy: Secondary | ICD-10-CM | POA: Diagnosis not present

## 2020-02-20 MED ORDER — SONAFINE EX EMUL
1.0000 "application " | Freq: Two times a day (BID) | CUTANEOUS | Status: DC
  Administered 2020-02-20: 1 via TOPICAL

## 2020-02-20 NOTE — Patient Instructions (Signed)
[facility] Spiritual Care Note  Left voicemail introducing availability of Spiritual Care as part of support team per referral from [facility]. Will try again later in the week if needed.   [place], [place], [facility] Pager [phone] Voicemail [phone]

## 2020-02-21 ENCOUNTER — Ambulatory Visit
Admission: RE | Admit: 2020-02-21 | Discharge: 2020-02-21 | Disposition: A | Discharge status: Home / Self Care | Payer: Medicare Other | Source: Ambulatory Visit | Attending: Radiation Oncology | Admitting: Radiation Oncology

## 2020-02-21 ENCOUNTER — Other Ambulatory Visit: Payer: Self-pay

## 2020-02-21 DIAGNOSIS — C50112 Malignant neoplasm of central portion of left female breast: Secondary | ICD-10-CM | POA: Diagnosis not present

## 2020-02-21 DIAGNOSIS — Z510 Encounter for antineoplastic radiation therapy: Secondary | ICD-10-CM | POA: Diagnosis not present

## 2020-02-21 DIAGNOSIS — R0789 Other chest pain: Secondary | ICD-10-CM | POA: Diagnosis not present

## 2020-02-22 ENCOUNTER — Other Ambulatory Visit: Payer: Self-pay

## 2020-02-22 ENCOUNTER — Ambulatory Visit
Admission: RE | Admit: 2020-02-22 | Discharge: 2020-02-22 | Disposition: A | Discharge status: Home / Self Care | Payer: Medicare Other | Source: Ambulatory Visit | Attending: Radiation Oncology | Admitting: Radiation Oncology

## 2020-02-22 DIAGNOSIS — C50112 Malignant neoplasm of central portion of left female breast: Secondary | ICD-10-CM | POA: Diagnosis not present

## 2020-02-22 DIAGNOSIS — Z510 Encounter for antineoplastic radiation therapy: Secondary | ICD-10-CM | POA: Diagnosis not present

## 2020-02-22 DIAGNOSIS — R0789 Other chest pain: Secondary | ICD-10-CM | POA: Diagnosis not present

## 2020-02-23 ENCOUNTER — Ambulatory Visit
Admission: RE | Admit: 2020-02-23 | Discharge: 2020-02-23 | Disposition: A | Discharge status: Home / Self Care | Payer: Medicare Other | Source: Ambulatory Visit | Attending: Radiation Oncology | Admitting: Radiation Oncology

## 2020-02-23 ENCOUNTER — Other Ambulatory Visit: Payer: Self-pay

## 2020-02-23 DIAGNOSIS — C50112 Malignant neoplasm of central portion of left female breast: Secondary | ICD-10-CM | POA: Diagnosis not present

## 2020-02-23 DIAGNOSIS — Z510 Encounter for antineoplastic radiation therapy: Secondary | ICD-10-CM | POA: Diagnosis not present

## 2020-02-23 DIAGNOSIS — R0789 Other chest pain: Secondary | ICD-10-CM | POA: Diagnosis not present

## 2020-02-26 ENCOUNTER — Other Ambulatory Visit: Payer: Self-pay

## 2020-02-26 ENCOUNTER — Encounter: Payer: Self-pay | Admitting: General Practice

## 2020-02-26 ENCOUNTER — Ambulatory Visit: Payer: Medicare Other | Admitting: Radiation Oncology

## 2020-02-26 ENCOUNTER — Ambulatory Visit
Admission: RE | Admit: 2020-02-26 | Discharge: 2020-02-26 | Disposition: A | Discharge status: Home / Self Care | Payer: Medicare Other | Source: Ambulatory Visit | Attending: Radiation Oncology | Admitting: Radiation Oncology

## 2020-02-26 ENCOUNTER — Other Ambulatory Visit: Payer: Self-pay | Admitting: Radiation Oncology

## 2020-02-26 DIAGNOSIS — Z510 Encounter for antineoplastic radiation therapy: Secondary | ICD-10-CM | POA: Diagnosis not present

## 2020-02-26 DIAGNOSIS — C50112 Malignant neoplasm of central portion of left female breast: Secondary | ICD-10-CM | POA: Diagnosis not present

## 2020-02-26 DIAGNOSIS — R0789 Other chest pain: Secondary | ICD-10-CM | POA: Diagnosis not present

## 2020-02-26 MED ORDER — ACETAMINOPHEN-CODEINE #3 300-30 MG PO TABS: ORAL_TABLET | ORAL | 0 refills | Status: DC

## 2020-02-26 NOTE — Progress Notes (Signed)
[facility] Spiritual Care Note  Left voicemail introducing Spiritual Care as part of Patient and Family Support Team, encouraging callback.   [place], [place], [facility] Pager [phone] Voicemail [phone]

## 2020-02-27 ENCOUNTER — Other Ambulatory Visit: Payer: Self-pay

## 2020-02-27 ENCOUNTER — Ambulatory Visit
Admission: RE | Admit: 2020-02-27 | Discharge: 2020-02-27 | Disposition: A | Discharge status: Home / Self Care | Payer: Medicare Other | Source: Ambulatory Visit | Attending: Radiation Oncology | Admitting: Radiation Oncology

## 2020-02-27 ENCOUNTER — Encounter: Payer: Self-pay | Admitting: General Practice

## 2020-02-27 DIAGNOSIS — Z510 Encounter for antineoplastic radiation therapy: Secondary | ICD-10-CM | POA: Diagnosis not present

## 2020-02-27 DIAGNOSIS — C50112 Malignant neoplasm of central portion of left female breast: Secondary | ICD-10-CM | POA: Diagnosis not present

## 2020-02-27 DIAGNOSIS — R0789 Other chest pain: Secondary | ICD-10-CM | POA: Diagnosis not present

## 2020-02-27 NOTE — Progress Notes (Signed)
[place] Note  Reached Shelly [name] by phone, providing pastoral presence and reflective listening as she shared and processed her experience so far. She reports good support from friends and neighbors, which helps her morale. Shelly [name] also states that she is tired from the radiation, napping daily and not sleeping well at night, but is hopeful that finishing treatment will help her move to a steadier sleep pattern. She is using positive attitude and humor to cope, especially as she worries about her daughter, who has been diagnosed with breast cancer. Shelly [name] requests prayer for her daughter, which will in turn bring her peace of mind, and she plans to follow up by phone once she learns about her daughter's post-op treatment plan.   [place], [place], [facility] Pager [phone] Voicemail [phone]

## 2020-02-28 ENCOUNTER — Other Ambulatory Visit: Payer: Self-pay

## 2020-02-28 ENCOUNTER — Ambulatory Visit
Admission: RE | Admit: 2020-02-28 | Discharge: 2020-02-28 | Disposition: A | Discharge status: Home / Self Care | Payer: Medicare Other | Source: Ambulatory Visit | Attending: Radiation Oncology | Admitting: Radiation Oncology

## 2020-02-28 DIAGNOSIS — Z510 Encounter for antineoplastic radiation therapy: Secondary | ICD-10-CM | POA: Diagnosis not present

## 2020-02-28 DIAGNOSIS — R0789 Other chest pain: Secondary | ICD-10-CM | POA: Diagnosis not present

## 2020-02-28 DIAGNOSIS — C50112 Malignant neoplasm of central portion of left female breast: Secondary | ICD-10-CM | POA: Diagnosis not present

## 2020-02-29 ENCOUNTER — Ambulatory Visit
Admission: RE | Admit: 2020-02-29 | Discharge: 2020-02-29 | Disposition: A | Discharge status: Home / Self Care | Payer: Medicare Other | Source: Ambulatory Visit | Attending: Radiation Oncology | Admitting: Radiation Oncology

## 2020-02-29 ENCOUNTER — Other Ambulatory Visit: Payer: Self-pay

## 2020-02-29 DIAGNOSIS — C50112 Malignant neoplasm of central portion of left female breast: Secondary | ICD-10-CM | POA: Diagnosis not present

## 2020-02-29 DIAGNOSIS — R0789 Other chest pain: Secondary | ICD-10-CM | POA: Diagnosis not present

## 2020-02-29 DIAGNOSIS — Z510 Encounter for antineoplastic radiation therapy: Secondary | ICD-10-CM | POA: Diagnosis not present

## 2020-03-01 ENCOUNTER — Ambulatory Visit
Admission: RE | Admit: 2020-03-01 | Discharge: 2020-03-01 | Disposition: A | Discharge status: Home / Self Care | Payer: Medicare Other | Source: Ambulatory Visit | Attending: Radiation Oncology | Admitting: Radiation Oncology

## 2020-03-01 ENCOUNTER — Other Ambulatory Visit: Payer: Self-pay

## 2020-03-01 DIAGNOSIS — C50112 Malignant neoplasm of central portion of left female breast: Secondary | ICD-10-CM | POA: Diagnosis not present

## 2020-03-01 DIAGNOSIS — R0789 Other chest pain: Secondary | ICD-10-CM | POA: Diagnosis not present

## 2020-03-01 DIAGNOSIS — Z510 Encounter for antineoplastic radiation therapy: Secondary | ICD-10-CM | POA: Diagnosis not present

## 2020-03-01 NOTE — Progress Notes (Signed)
[place]   Telephone:([phone] [phone]   Clinic Follow up Note   Patient Care Team: Eulas Post, MD as PCP - General (Family Medicine) Barbaraann Cao, [place] as Referring Physician (Optometry) Jovita Kussmaul, MD as Consulting Physician (General Surgery) Mauro Kaufmann, RN as Oncology Nurse Navigator Rockwell Germany, RN as Oncology Nurse Navigator  Date of Service:  03/06/2020  CHIEF COMPLAINT: F/u of left breast cancer  SUMMARY OF ONCOLOGIC HISTORY: Oncology History Overview Note  Cancer Staging Cancer of central portion of left female breast [facility]) Staging form: Breast, AJCC 8th Edition - Clinical stage from 09/18/2019: Stage IA (cT1b, cN0, cM0, G2, ER+, PR+, HER2-) - Signed by Truitt Merle, MD on 09/18/2019 - Pathologic stage from 10/06/2019: Stage IA (pT1c, pN1a, cM0, G2, ER+, PR+, HER2-) - Signed by Truitt Merle, MD on 10/19/2019    Cancer of central portion of left female breast ([place])  08/24/2019 Mammogram   Diagnostic mammogram 08/24/19  IMPRESSION: 1. Left breast mass measuring 0.8 x 0.4 x 0.5 cm at the 1 o'clock retroareolar region is suspicious and likely corresponds to the area of distortion seen on mammography.   2. Left breast 3 o'clock retroareolar dilated duct with internal debris versus solid mass.   3. Right breast retroareolar focal asymmetry without sonographic correlate.   08/28/2019 Initial Biopsy   Final Diagnosis 08/28/19 1. Breast, left, needle core biopsy, 1 o'clock/retroareolar - INVASIVE DUCTAL CARCINOMA, GRADE II. - PERINEURAL INVASION. - SEE MICROSCOPIC DESCRIPTION. 2. Breast, left, needle core biopsy, 3 o'clock retroareolar - DUCTAL PAPILLOMA. - SEE MICROSCOPIC DESCRIPTION.   08/28/2019 Receptors her2   1. PROGNOSTIC INDICATORS 08/28/19 Results: IMMUNOHISTOCHEMICAL AND MORPHOMETRIC ANALYSIS PERFORMED MANUALLY The tumor cells are NEGATIVE for Her2 (0). Estrogen Receptor: 100%, POSITIVE, STRONG STAINING  INTENSITY Progesterone Receptor: 50%, POSITIVE, STRONG STAINING INTENSITY Proliferation Marker Ki67: 12%   09/01/2019 Pathology Results   Diagnosis 09/01/19 Breast, right, needle core biopsy, retroareolar - COMPLEX SCLEROSING LESION WITH A PAPILLARY SCLEROSING LESION AND USUAL DUCTAL HYPERPLASIA. SEE NOTE - NEGATIVE FOR CARCINOMA   09/18/2019 Initial Diagnosis   Cancer of central portion of left female breast ([place])   09/18/2019 Cancer Staging   Staging form: Breast, AJCC 8th Edition - Clinical stage from 09/18/2019: Stage IA (cT1b, cN0, cM0, G2, ER+, PR+, HER2-) - Signed by Truitt Merle, MD on 09/18/2019   10/06/2019 Surgery   LEFT BREAST LUMPECTOMY  X2 WITH RADIOACTIVE SEED X2  AND SENTINEL LYMPH NODE BIOPSY and  RIGHT BREAST LUMPECTOMY WITH RADIOACTIVE SEED LOCALIZATION by Dr Marlou Starks  10/06/19    10/06/2019 Pathology Results   FINAL MICROSCOPIC DIAGNOSIS: 10/06/19   A. BREAST, RIGHT, LUMPECTOMY:  - Fibrocystic change with a small incidental intraductal papilloma and  usual ductal hyperplasia  - Biopsy site changes  - Negative for carcinoma   B. LYMPH NODE, LEFT #1, SENTINEL, BIOPSY:  - Metastatic carcinoma to a lymph node (1/1)  - Focus of metastatic carcinoma measures 2.5 mm and shows extranodal  extension   C. LYMPH NODE, LEFT #2, SENTINEL, BIOPSY:  - Lymph node, negative for carcinoma (0/1)   D. LYMPH NODE, LEFT #3, SENTINEL, BIOPSY:  - Lymph node, negative for carcinoma (0/1)   E. BREAST, LEFT, LUMPECTOMY:  - Invasive ductal carcinoma, grade 2, 1.8 cm  - Ductal carcinoma in situ, intermediate grade  - Invasive carcinoma focally involves the anterior margin  - DCIS is less than 1 mm from the superior margin  - Background breast parenchyma with fibrocystic  change, usual ductal  hyperplasia and intraductal papilloma.  - Biopsy site changes  - See oncology table    10/06/2019 Miscellaneous   Her Mammaprint showed High Risk Luminal Type B, risk of recurrence in the next  10 years is 29% in high risk disease. MPI -0.335    10/06/2019 Cancer Staging   Staging form: Breast, AJCC 8th Edition - Pathologic stage from 10/06/2019: Stage IA (pT1c, pN1a, cM0, G2, ER+, PR+, HER2-) - Signed by Truitt Merle, MD on 10/19/2019   11/06/2019 Surgery   EXCISION NIPPLE AND AREOLA  MARGIN LEFT BREAST by Dr Marlou Starks  11/06/19    11/06/2019 Pathology Results    FINAL MICROSCOPIC DIAGNOSIS: 11/06/19  A. BREAST, LEFT CENTRAL, RE-EXCISION:  -  Residual ductal carcinoma in situ, intermediate grade  -  Margins uninvolved by carcinoma (0.4 cm; superior margin)  -  Atypical intraductal papilloma  -  Usual ductal hyperplasia  -  Benign skin with seborrheic keratosis  -  Previous surgical site changes  -  No invasive carcinoma identified    12/06/2019 - 12/20/2019 Chemotherapy   Adjuvant chemo TC q3weeks for 4 cycles starting 12/06/19. With first TC infusion she had allergy reaction to docetaxel and [name] was not given on 12/06/19, we discontinued.   -I started her on weekly Abraxane on 12/20/19, but 2 days after infusion she started to feel tachycardic, hot/flushed, and lightheaded. A few days later she had dysuria, diarrhea and abdominal cramps and intermittent chest pain. She opted to d/c chemo and proceed with RT.    01/25/2020 - 03/06/2020 Radiation Therapy   Adjuvant Radiation with Dr Isidore Moos 01/25/20-03/06/20   03/2020 -  Anti-estrogen oral therapy   Tamoxifen 14m daily starting in 03/2020      CURRENT THERAPY:  Adjuvant Radiation with Dr SIsidore Moos3/11/21-4/21/21 Tamoxifen 245mdaily starting in 03/2020    INTERVAL HISTORY:  PaLamaria Hildebrandtlder is here for a follow up. She presents to the clinic with her husband. She notes she tolerated RT mostly well with fatigue. She completed RT today. She notes 1 episode of shoulder pain. She had ED visit with moderate empasma and non specific inflammation in her lungs, no blood clot or MI. Her shoulder pain resolved that day. She notes she has been  stressed because her 5374o daughter was recently diagnosed with breast cancer. She notes rib fractures and broken toes before. She is interested in Tamoxifen.    REVIEW OF SYSTEMS:   Constitutional: Denies fevers, chills or abnormal weight loss (+) Fatigue  Eyes: Denies blurriness of vision Ears, nose, mouth, throat, and face: Denies mucositis or sore throat Respiratory: Denies cough, dyspnea or wheezes Cardiovascular: Denies palpitation, chest discomfort or lower extremity swelling Gastrointestinal:  Denies nausea, heartburn or change in bowel habits Skin: Denies abnormal skin rashes Lymphatics: Denies new lymphadenopathy or easy bruising Neurological:Denies numbness, tingling or new weaknesses Behavioral/Psych: Mood is stable, no new changes  All other systems were reviewed with the patient and are negative.  MEDICAL HISTORY:  Past Medical History:  Diagnosis Date  . Arthritis    wrist, shoulder   . [place]   left breast IDC  . Diverticulitis   . Endometrial polyp   . Family history of breast cancer   . GERD (gastroesophageal reflux disease)    occasional - diet controlled  . Hx of adenomatous colonic polyps   . Hyperlipidemia   . IBS (irritable bowel syndrome)   . Missed abortion    x 1 - resolved -  no surgery  . Osteoporosis   . SVD (spontaneous vaginal delivery)    x 1  . Transient vision disturbance 2019   was evaluated by ED, no findings  . Vitamin D deficiency     SURGICAL HISTORY: Past Surgical History:  Procedure Laterality Date  . BREAST LUMPECTOMY WITH RADIOACTIVE SEED AND SENTINEL LYMPH NODE BIOPSY Left 10/06/2019   Procedure: LEFT BREAST LUMPECTOMY  X2 WITH RADIOACTIVE SEED X2  AND SENTINEL LYMPH NODE BIOPSY;  Surgeon: Jovita Kussmaul, MD;  Location: [facility];  Service: General;  Laterality: Left;  . BREAST LUMPECTOMY WITH RADIOACTIVE SEED LOCALIZATION Right 10/06/2019   Procedure: RIGHT BREAST LUMPECTOMY WITH RADIOACTIVE SEED  LOCALIZATION;  Surgeon: Jovita Kussmaul, MD;  Location: [facility];  Service: General;  Laterality: Right;  . COLONOSCOPY    . DIAGNOSTIC LAPAROSCOPY     cysts  . DILATATION & CURETTAGE/HYSTEROSCOPY WITH TRUECLEAR N/A 10/31/2013   Procedure: DILATATION & CURETTAGE/HYSTEROSCOPY WITH TRUCLEAR;  Surgeon: Marylynn Pearson, MD;  Location: [facility];  Service: Gynecology;  Laterality: N/A;  . DILATION AND CURETTAGE OF UTERUS    . MASS EXCISION Left 11/06/2019   Procedure: EXCISION NIPPLE AND AREOLA  MARGIN LEFT BREAST;  Surgeon: Jovita Kussmaul, MD;  Location: [facility];  Service: General;  Laterality: Left;  . TONSILLECTOMY  1952  . WISDOM TOOTH EXTRACTION      I have reviewed the social history and family history with the patient and they are unchanged from previous note.  ALLERGIES:  is allergic to taxotere [docetaxel]; abraxane [paclitaxel protein-bound part]; crestor [rosuvastatin calcium]; lipitor [atorvastatin calcium]; nsaids; pravastatin; and sulfonamide derivatives.  MEDICATIONS:  Current Outpatient Medications  Medication Sig Dispense Refill  . acetaminophen (TYLENOL) 500 MG tablet Take 500 mg by mouth every 6 (six) hours as needed for headache.    . denosumab (PROLIA) 60 MG/ML SOSY injection Inject 60 mg into the skin every 6 (six) months.    . esomeprazole (NEXIUM) 20 MG capsule Take 20 mg by mouth daily as needed (acid reflux).     Marland Kitchen loratadine (CLARITIN) 10 MG tablet Take 10 mg by mouth daily as needed for allergies.     Marland Kitchen PRESCRIPTION MEDICATION Apply 1 application topically at bedtime. Cream provided by hospital for breast blisters after radiation    . tamoxifen (NOLVADEX) 20 MG tablet Take 1 tablet (20 mg total) by mouth daily. 30 tablet 3  . Thiamine HCl (VITAMIN B-1 PO) Take 1 tablet by mouth daily.    . Vitamin D, Ergocalciferol, (DRISDOL) 1.25 MG (50000 UNIT) CAPS capsule Take 1 capsule (50,000 Units total) by mouth every 7 (seven) days. 12 capsule  1   No current facility-administered medications for this visit.    PHYSICAL EXAMINATION: ECOG PERFORMANCE STATUS: 2 - Symptomatic, <50% confined to bed  Vitals:   03/06/20 1118  BP: (!) 157/74  Pulse: 72  Resp: 17  Temp: 98.5 F (36.9 C)  SpO2: 97%   Filed Weights   03/06/20 1118  Weight: 180 lb 14.4 oz (82.1 kg)    GENERAL:alert, no distress and comfortable SKIN: skin color, texture, turgor are normal, no rashes or significant lesions EYES: normal, Conjunctiva are pink and non-injected, sclera clear  NECK: supple, thyroid normal size, non-tender, without nodularity LYMPH:  no palpable lymphadenopathy in the cervical, axillary  LUNGS: clear to auscultation and percussion with normal breathing effort HEART: regular rate & rhythm and no murmurs and no lower extremity edema ABDOMEN:abdomen soft, non-tender  and normal bowel sounds Musculoskeletal:no cyanosis of digits and no clubbing  NEURO: alert & oriented x 3 with fluent speech, no focal motor/sensory deficits BREAST: S/p b/l breast lumpectomy: Surgical incisions healed well. (+) Left breast skin erythema from RT. No palpable mass, nodules or adenopathy bilaterally. Breast exam benign.   LABORATORY DATA:  I have reviewed the data as listed CBC Latest Ref Rng & Units 02/12/2020 01/03/2020 12/20/2019  WBC 4.0 - 10.5 K/uL 5.0 5.7 6.1  Hemoglobin 12.0 - 15.0 g/dL 13.9 13.3 13.6  Hematocrit 36.0 - 46.0 % 42.9 40.1 41.6  Platelets 150 - 400 K/uL 253 323 260     CMP Latest Ref Rng & Units 02/12/2020 01/03/2020 12/20/2019  Glucose 70 - 99 mg/dL 110(H) 126(H) 98  BUN 8 - 23 mg/dL _0 Creatinine 0.44 - 1.00 mg/dL 0.83 0.93 0.83  Sodium 135 - 145 mmol/L 140 143 142  Potassium 3.5 - 5.1 mmol/L 4.2 4.0 4.2  Chloride 98 - 111 mmol/L 106 107 108  CO2 22 - 32 mmol/L _1 Calcium 8.9 - 10.3 mg/dL 8.8(L) 9.3 8.7(L)  Total Protein 6.5 - 8.1 g/dL - 7.0 6.7  Total Bilirubin 0.3 - 1.2 mg/dL - 0.3 0.3  Alkaline Phos 38 - 126 U/L -  73 69  AST 15 - 41 U/L - 12(L) 12(L)  ALT 0 - 44 U/L - 17 16      RADIOGRAPHIC STUDIES: I have personally reviewed the radiological images as listed and agreed with the findings in the report. No results found.   ASSESSMENT & PLAN:  Varsha Knock is a 74 y.o. female with    1.Cancer of central portion of left female breast, StageIA, c(T1bN0M0), ER/PR+, HER2-, GradeII, pT1cN1aM0, stage IA -She was diagnosed in 08/2019. She underwent B/l breast lumpectomy and SLNB with Dr. Marlou Starks on 10/06/19.Due to positive margins and 1/3 positive LNs she underwent re-excisional surgery on 11/06/19 which showed DCIS only, margins negative.She has high risk for recurrence based on Mammaprint. -To reduce her risk of recurrence Istarted her onadjuvant chemo with TC q3weeks for 4 cyclesstarting 12/06/19.She had reaction with start of C1 infusion and was d/c. Switched to weekly Abraxane for 12 weeks starting2/3/21but stopped after first dose due to poor toleration.  -She is currently undergoing adjuvant RT with Dr Isidore Moos 01/25/20-03/06/20. She completed today and tolerated well with Fatigue and skin change.  -Given the strong ER and PR positivity, I do recommend adjuvant antiestrogen therapy for 5-7 years. I reviewed both AI and Tamoxifen risks and benefits.   -The potential benefit and side effects of AI, which includes but not limited to, hot flash, skin and vaginal dryness, metabolic changes ( increased blood glucose, cholesterol, weight, etc.), slightly in increased risk of cardiovascular disease, cataracts, muscular and joint discomfort, osteopenia and osteoporosis, etc, were discussed with her in great details.  -The potential side effects of Tamoxifen, which includes but not limited to, hot flash, skin and vaginal dryness, slightly increased risk of cardiovascular disease and cataract, small risk of thrombosis and endometrial cancer, were discussed with her in great details.  -We discussed her  arthritis and her osteoporosis. She opted to proceed with Tamoxifen, plan to start in 03/2020. Will watch for blood clots and vaginal bleeding. -We also discussed the breast cancer surveillance. She will continue annual screening mammogram, self exam, and a routine office visit with lab and exam with [place]. -I offered her Survivorship clinic visit. She is agreed, will proceed in 3  months. F/u with me in 6 months.    2. Osteoporosis, Arthritis  -Diagnosed prior to 2007 in [place] in 06/2015 (-3.2 at [facility]) -On Prolia injections every 6 months since 2017-2018, will continue.  -Her Arthritis is manageable with activity.    3. Diarrhea and Acid Reflux  -She has been on Librax and Nexium. I encouraged her to continue Nexium BID as needed.  -She can use OTC and Imodium and Librax. If not enough I will call in Lomotil.   4. Anxiety, insomnia  -She is nervous about her cancer diagnosis and chemo.  -She did have trouble sleeping, etiology likely multifactorial.  -She is also on Ativan. She can use benadryl and Melatonin to help her sleep if needed. -Last night she has an episode of what appears to be a panic attack. Her initial BP was in 200 range with normal heart rate. She was able to calm down and have improved BP and was checked by EMS.  -She has not been on antidepressants before. She feels her anxiety is tied to her IBS so her anxiety is helped with Librax.  -I discussed Antiestrogen therapy can effect her anxiety and overall mood. I discussed the role of antidepressants if indicated while on antiestrogen therapy.    5. Genetic Testing  -She has 3 maternal family members who had breast cancer and overall significant family history of cancer.Shewas seen by genetics on1/13/20and she did not want to pursue genetic testing.   6. H/o smoking, Emphysema, abnormal CT chest in 01/2020 -She has h/o smoking.  -On her 02/12/20 CT Angio chest she has b/l ground glass changes and  moderate emphysema. I reviewed with her today. There is no past chest scan to compare this.  -I recommend repeating CT chest in 6 months She is agreeable.    7. Vitamin D deficiency  -She was placed on high dose Vit D by PCP but plans to start soon. I suggest she take Tums with this.    PLAN: -I called in Tamoxifen today, she will start in early May  -Survivorship clinic in 3 months  -F/u in 6 months with lab and CT chest wo contrast a few days before    No problem-specific Assessment & Plan notes found for this encounter.   Orders Placed This Encounter  Procedures  . CT Chest Wo Contrast    Standing Status:   Future    Standing Expiration Date:   03/06/2021    Order Specific Question:   Preferred imaging location?    Answer:   [facility]    Order Specific Question:   Radiology Contrast Protocol - do NOT remove file path    Answer:   [email or web address]   All questions were answered. The patient knows to call the clinic with any problems, questions or concerns. No barriers to learning was detected. The total time spent in the appointment was 30 minutes.     Truitt Merle, MD 03/06/2020   I, Joslyn Devon, am acting as scribe for Truitt Merle, MD.   I have reviewed the above documentation for accuracy and completeness, and I agree with the above.

## 2020-03-04 ENCOUNTER — Ambulatory Visit
Admission: RE | Admit: 2020-03-04 | Discharge: 2020-03-04 | Disposition: A | Discharge status: Home / Self Care | Payer: Medicare Other | Source: Ambulatory Visit | Attending: Radiation Oncology | Admitting: Radiation Oncology

## 2020-03-04 DIAGNOSIS — C50112 Malignant neoplasm of central portion of left female breast: Secondary | ICD-10-CM | POA: Diagnosis not present

## 2020-03-04 DIAGNOSIS — Z510 Encounter for antineoplastic radiation therapy: Secondary | ICD-10-CM | POA: Diagnosis not present

## 2020-03-04 DIAGNOSIS — R0789 Other chest pain: Secondary | ICD-10-CM | POA: Diagnosis not present

## 2020-03-05 ENCOUNTER — Other Ambulatory Visit: Payer: Self-pay

## 2020-03-05 ENCOUNTER — Ambulatory Visit
Admission: RE | Admit: 2020-03-05 | Discharge: 2020-03-05 | Disposition: A | Discharge status: Home / Self Care | Payer: Medicare Other | Source: Ambulatory Visit | Attending: Radiation Oncology | Admitting: Radiation Oncology

## 2020-03-05 DIAGNOSIS — C50112 Malignant neoplasm of central portion of left female breast: Secondary | ICD-10-CM | POA: Diagnosis not present

## 2020-03-05 DIAGNOSIS — R0789 Other chest pain: Secondary | ICD-10-CM | POA: Diagnosis not present

## 2020-03-05 DIAGNOSIS — Z510 Encounter for antineoplastic radiation therapy: Secondary | ICD-10-CM | POA: Diagnosis not present

## 2020-03-06 ENCOUNTER — Inpatient Hospital Stay: Payer: Medicare Other | Attending: Hematology | Admitting: Hematology

## 2020-03-06 ENCOUNTER — Encounter: Payer: Self-pay | Admitting: Radiation Oncology

## 2020-03-06 ENCOUNTER — Encounter: Payer: Self-pay | Admitting: Hematology

## 2020-03-06 ENCOUNTER — Ambulatory Visit
Admission: RE | Admit: 2020-03-06 | Discharge: 2020-03-06 | Disposition: A | Discharge status: Home / Self Care | Payer: Medicare Other | Source: Ambulatory Visit | Attending: Radiation Oncology | Admitting: Radiation Oncology

## 2020-03-06 ENCOUNTER — Encounter: Payer: Self-pay | Admitting: *Deleted

## 2020-03-06 ENCOUNTER — Other Ambulatory Visit: Payer: Self-pay

## 2020-03-06 VITALS — BP 157/74 | HR 72 | Temp 98.5°F | Resp 17 | Ht 65.0 in | Wt 180.9 lb

## 2020-03-06 DIAGNOSIS — M818 Other osteoporosis without current pathological fracture: Secondary | ICD-10-CM | POA: Diagnosis not present

## 2020-03-06 DIAGNOSIS — Z923 Personal history of irradiation: Secondary | ICD-10-CM | POA: Diagnosis not present

## 2020-03-06 DIAGNOSIS — Z79899 Other long term (current) drug therapy: Secondary | ICD-10-CM | POA: Insufficient documentation

## 2020-03-06 DIAGNOSIS — Z79810 Long term (current) use of selective estrogen receptor modulators (SERMs): Secondary | ICD-10-CM | POA: Diagnosis not present

## 2020-03-06 DIAGNOSIS — Z170 Estrogen receptor positive status [ER+]: Secondary | ICD-10-CM

## 2020-03-06 DIAGNOSIS — E559 Vitamin D deficiency, unspecified: Secondary | ICD-10-CM | POA: Insufficient documentation

## 2020-03-06 DIAGNOSIS — Z510 Encounter for antineoplastic radiation therapy: Secondary | ICD-10-CM | POA: Diagnosis not present

## 2020-03-06 DIAGNOSIS — C50112 Malignant neoplasm of central portion of left female breast: Secondary | ICD-10-CM | POA: Diagnosis not present

## 2020-03-06 DIAGNOSIS — R0789 Other chest pain: Secondary | ICD-10-CM | POA: Diagnosis not present

## 2020-03-06 MED ORDER — TAMOXIFEN CITRATE 20 MG PO TABS: 20.0000 mg | ORAL_TABLET | Freq: Every day | ORAL | 3 refills | Status: DC

## 2020-03-07 ENCOUNTER — Telehealth: Payer: Self-pay | Admitting: Hematology

## 2020-03-07 NOTE — Telephone Encounter (Signed)
Scheduled appt per 4/21 los.  Spoke with pt and they are aware of the appt date and time.

## 2020-03-27 DIAGNOSIS — C50919 Malignant neoplasm of unspecified site of unspecified female breast: Secondary | ICD-10-CM | POA: Diagnosis not present

## 2020-04-04 ENCOUNTER — Telehealth: Payer: Self-pay

## 2020-04-04 NOTE — Telephone Encounter (Signed)
I called the patient today about her upcoming follow-up appointment in radiation oncology.   Given the state of the COVID-19 pandemic, concerning case numbers in our community, and guidance from [facility], I offered a phone assessment with the patient to determine if coming to the clinic was necessary. She accepted.  I let the patient know that I had spoken with Dr. Isidore Moos, and she wanted them to know the importance of washing their hands for at least 20 seconds at a time, especially after going out in public, and before they eat.  Limit going out in public whenever possible. Do not touch your face, unless your hands are clean, such as when bathing. Get plenty of rest, eat well, and stay hydrated. Patient verbalized understanding and agreement.  The patient denies any symptomatic concerns. She still feels drained, but thinks it may be related to her low vitamin D levels. She restarted her weekly vitamin D supplement and believes it will help with her low energy. She still has occasional pain/soreness in her breast, but states she is able to manage. She reports good healing of their skin in the radiation fields.  Skin is intact and "almost completely back to normal". I recommended that she continue skin care by applying oil or lotion with vitamin E to the skin in the radiation fields, BID, for 2 more months. She verbalized understanding and agreement. She is planning a weekend getaway to the [employer], and is hoping a change of scenery will lift her spirits. She plans to start her Tamoxifen prescription this coming Monday when she returns from her trip.  Continue follow-up with medical oncology - follow-up is scheduled on 06/06/2020 with Lacie Burton-NP and the [facility].  I explained that yearly mammograms are important for patients with intact breast tissue, and physical exams are important after mastectomy for patients that cannot undergo mammography. Patient verbalized  understanding and agreement.  I encouraged her to call if she had further questions or concerns about her healing. Otherwise, she will follow-up PRN in radiation oncology. Patient is pleased with this plan, and we will cancel her upcoming follow-up to reduce the risk of COVID-19 transmission.

## 2020-04-05 ENCOUNTER — Ambulatory Visit: Payer: Medicare Other | Admitting: Radiation Oncology

## 2020-04-17 ENCOUNTER — Other Ambulatory Visit: Payer: Medicare Other

## 2020-04-26 DIAGNOSIS — Z961 Presence of intraocular lens: Secondary | ICD-10-CM | POA: Diagnosis not present

## 2020-04-26 DIAGNOSIS — H5201 Hypermetropia, right eye: Secondary | ICD-10-CM | POA: Diagnosis not present

## 2020-05-03 ENCOUNTER — Other Ambulatory Visit (INDEPENDENT_AMBULATORY_CARE_PROVIDER_SITE_OTHER): Payer: Medicare Other

## 2020-05-03 ENCOUNTER — Other Ambulatory Visit: Payer: Self-pay

## 2020-05-03 DIAGNOSIS — E559 Vitamin D deficiency, unspecified: Secondary | ICD-10-CM

## 2020-05-03 LAB — VITAMIN D 25 HYDROXY (VIT D DEFICIENCY, FRACTURES): VITD: 35.5 ng/mL (ref 30.00–100.00)

## 2020-05-06 ENCOUNTER — Other Ambulatory Visit: Payer: Medicare Other

## 2020-05-08 NOTE — Progress Notes (Signed)
  Patient Name: [name] MRN: [id number] DOB: 24-May-1946 Referring Physician: Carolann Littler (Profile Not Attached) Date of Service: 03/06/2020 [facility] [place]                                                        End Of Treatment Note  Diagnoses: C50.112-Malignant neoplasm of central portion of left female breast  Cancer Staging: Cancer Staging Cancer of central portion of left female breast [facility]) Staging form: Breast, AJCC 8th Edition - Clinical stage from 09/18/2019: Stage IA (cT1b, cN0, cM0, G2, ER+, PR+, HER2-) - Signed by Truitt Merle, MD on 09/18/2019 - Pathologic stage from 10/06/2019: Stage IA (pT1c, pN1a, cM0, G2, ER+, PR+, HER2-) - Signed by Truitt Merle, MD on 10/19/2019  Intent: Curative  Radiation Treatment Dates: 01/25/2020 through 03/06/2020 Site Technique Total Dose (Gy) Dose per Fx (Gy) Completed Fx Beam Energies  Breast, Left: Breast_Lt 3D 50/50 2 25/25 10X  Breast, Left: Breast_Lt_PAB_SCV 3D 50/50 2 25/25 6X, 10X  Breast, Left: Breast_Lt_Bst 3D 10/10 2 5/5 6X, 10X   Narrative: The patient tolerated radiation therapy relatively well.   Plan: The patient will follow-up with radiation oncology in 23mo or as needed. -----------------------------------  SEppie Gibson MD

## 2020-05-14 ENCOUNTER — Telehealth: Payer: Self-pay

## 2020-05-14 NOTE — Telephone Encounter (Signed)
Shelly [name] left vm stating that she was having side effects from tamoxifen that she wanted to discuss.  I returned her call but there was not answer. I left a vm.

## 2020-05-15 ENCOUNTER — Other Ambulatory Visit: Payer: Self-pay | Admitting: Hematology

## 2020-05-15 MED ORDER — EXEMESTANE 25 MG PO TABS: 25.0000 mg | ORAL_TABLET | Freq: Every day | ORAL | 3 refills | Status: DC

## 2020-05-15 NOTE — Telephone Encounter (Signed)
I spoke with Shelly [name].  She states the tamoxifen caused significant IBS flares.  She has been unable to take the tamoxifen.  She is wanting to know if Dr. Burr Medico can prescribe an alternative.

## 2020-05-17 ENCOUNTER — Telehealth: Payer: Self-pay | Admitting: Hematology

## 2020-05-17 NOTE — Telephone Encounter (Signed)
R/s due to changes in template. Pt is aware of appt time and date.

## 2020-05-17 NOTE — Telephone Encounter (Signed)
I spoke with Shelly [name] and let her know that Dr. Burr Medico sent a prescription for Exemestane to her pharmacy.  I told her to start after her IBS flare has subsided.  She verbalized understanding.

## 2020-05-28 ENCOUNTER — Other Ambulatory Visit: Payer: Self-pay | Admitting: Hematology

## 2020-05-28 NOTE — Telephone Encounter (Signed)
Please review for refill - Rx states d/c'd on 05/15/20

## 2020-05-28 NOTE — Telephone Encounter (Signed)
Please review for refill - d/c'd by Dr. Burr Medico 05/15/20

## 2020-06-04 NOTE — Progress Notes (Signed)
Patient Care Team: Eulas Post, MD as PCP - General (Family Medicine) Barbaraann Cao, [place] as Referring Physician (Optometry) Jovita Kussmaul, MD as Consulting Physician (General Surgery) Mauro Kaufmann, RN as Oncology Nurse Navigator Rockwell Germany, RN as Oncology Nurse Navigator Truitt Merle, MD as Consulting Physician (Hematology) Alla Feeling, NP as Nurse Practitioner (Nurse Practitioner) Eppie Gibson, MD as Attending Physician (Radiation Oncology)  CLINIC:  Survivorship   REASON FOR VISIT:  Routine follow-up post-treatment for a recent history of breast cancer.  BRIEF ONCOLOGIC HISTORY:  Oncology History Overview Note  Cancer Staging Cancer of central portion of left female breast [facility]) Staging form: Breast, AJCC 8th Edition - Clinical stage from 09/18/2019: Stage IA (cT1b, cN0, cM0, G2, ER+, PR+, HER2-) - Signed by Truitt Merle, MD on 09/18/2019 - Pathologic stage from 10/06/2019: Stage IA (pT1c, pN1a, cM0, G2, ER+, PR+, HER2-) - Signed by Truitt Merle, MD on 10/19/2019    Cancer of central portion of left female breast ([place])  08/24/2019 Mammogram   Diagnostic mammogram 08/24/19  IMPRESSION: 1. Left breast mass measuring 0.8 x 0.4 x 0.5 cm at the 1 o'clock retroareolar region is suspicious and likely corresponds to the area of distortion seen on mammography.   2. Left breast 3 o'clock retroareolar dilated duct with internal debris versus solid mass.   3. Right breast retroareolar focal asymmetry without sonographic correlate.   08/28/2019 Initial Biopsy   Final Diagnosis 08/28/19 1. Breast, left, needle core biopsy, 1 o'clock/retroareolar - INVASIVE DUCTAL CARCINOMA, GRADE II. - PERINEURAL INVASION. - SEE MICROSCOPIC DESCRIPTION. 2. Breast, left, needle core biopsy, 3 o'clock retroareolar - DUCTAL PAPILLOMA. - SEE MICROSCOPIC DESCRIPTION.   08/28/2019 Receptors her2   1. PROGNOSTIC INDICATORS 08/28/19 Results: IMMUNOHISTOCHEMICAL AND MORPHOMETRIC  ANALYSIS PERFORMED MANUALLY The tumor cells are NEGATIVE for Her2 (0). Estrogen Receptor: 100%, POSITIVE, STRONG STAINING INTENSITY Progesterone Receptor: 50%, POSITIVE, STRONG STAINING INTENSITY Proliferation Marker Ki67: 12%   09/01/2019 Pathology Results   Diagnosis 09/01/19 Breast, right, needle core biopsy, retroareolar - COMPLEX SCLEROSING LESION WITH A PAPILLARY SCLEROSING LESION AND USUAL DUCTAL HYPERPLASIA. SEE NOTE - NEGATIVE FOR CARCINOMA   09/18/2019 Initial Diagnosis   Cancer of central portion of left female breast ([place])   09/18/2019 Cancer Staging   Staging form: Breast, AJCC 8th Edition - Clinical stage from 09/18/2019: Stage IA (cT1b, cN0, cM0, G2, ER+, PR+, HER2-) - Signed by Truitt Merle, MD on 09/18/2019   10/06/2019 Surgery   LEFT BREAST LUMPECTOMY  X2 WITH RADIOACTIVE SEED X2  AND SENTINEL LYMPH NODE BIOPSY and  RIGHT BREAST LUMPECTOMY WITH RADIOACTIVE SEED LOCALIZATION by Dr Marlou Starks  10/06/19    10/06/2019 Pathology Results   FINAL MICROSCOPIC DIAGNOSIS: 10/06/19   A. BREAST, RIGHT, LUMPECTOMY:  - Fibrocystic change with a small incidental intraductal papilloma and  usual ductal hyperplasia  - Biopsy site changes  - Negative for carcinoma   B. LYMPH NODE, LEFT #1, SENTINEL, BIOPSY:  - Metastatic carcinoma to a lymph node (1/1)  - Focus of metastatic carcinoma measures 2.5 mm and shows extranodal  extension   C. LYMPH NODE, LEFT #2, SENTINEL, BIOPSY:  - Lymph node, negative for carcinoma (0/1)   D. LYMPH NODE, LEFT #3, SENTINEL, BIOPSY:  - Lymph node, negative for carcinoma (0/1)   E. BREAST, LEFT, LUMPECTOMY:  - Invasive ductal carcinoma, grade 2, 1.8 cm  - Ductal carcinoma in situ, intermediate grade  - Invasive carcinoma focally involves the anterior margin  - DCIS is less  than 1 mm from the superior margin  - Background breast parenchyma with fibrocystic change, usual ductal  hyperplasia and intraductal papilloma.  - Biopsy site changes  - See  oncology table    10/06/2019 Miscellaneous   Her Mammaprint showed High Risk Luminal Type B, risk of recurrence in the next 10 years is 29% in high risk disease. MPI -0.335    10/06/2019 Cancer Staging   Staging form: Breast, AJCC 8th Edition - Pathologic stage from 10/06/2019: Stage IA (pT1c, pN1a, cM0, G2, ER+, PR+, HER2-) - Signed by Truitt Merle, MD on 10/19/2019   11/06/2019 Surgery   EXCISION NIPPLE AND AREOLA  MARGIN LEFT BREAST by Dr Marlou Starks  11/06/19    11/06/2019 Pathology Results    FINAL MICROSCOPIC DIAGNOSIS: 11/06/19  A. BREAST, LEFT CENTRAL, RE-EXCISION:  -  Residual ductal carcinoma in situ, intermediate grade  -  Margins uninvolved by carcinoma (0.4 cm; superior margin)  -  Atypical intraductal papilloma  -  Usual ductal hyperplasia  -  Benign skin with seborrheic keratosis  -  Previous surgical site changes  -  No invasive carcinoma identified    12/06/2019 - 12/20/2019 Chemotherapy   Adjuvant chemo TC q3weeks for 4 cycles starting 12/06/19. With first TC infusion she had allergy reaction to docetaxel and [name] was not given on 12/06/19, we discontinued.   -I started her on weekly Abraxane on 12/20/19, but 2 days after infusion she started to feel tachycardic, hot/flushed, and lightheaded. A few days later she had dysuria, diarrhea and abdominal cramps and intermittent chest pain. She opted to d/c chemo and proceed with RT.    01/25/2020 - 03/06/2020 Radiation Therapy   Adjuvant Radiation with Dr Isidore Moos 01/25/20-03/06/20   03/2020 -  Anti-estrogen oral therapy   Tamoxifen 64m daily starting in 03/2020     INTERVAL HISTORY:  [facility] meeting to review her survivorship care plan detailing her treatment course for breast cancer, as well as monitoring long-term side effects of that treatment, education regarding health maintenance, screening, and overall wellness and health promotion.     Ms. EFulbrightis doing well in  general.  She has recovered well from radiation.  She had soreness in the left breast that she realized was related to frequent vacuuming, the pain resolved when she left town and did not vacuum for a week.  Otherwise denies new breast concerns such as new lump/mass, nipple discharge, or skin change.  She tried tamoxifen 20 mg once daily and had a "IBS flare" which consists of abdominal discomfort, gas, and bloating.  Denies constipation or diarrhea.  She also attributes flares to acidic diet.  Symptoms resolve after bowel movement and occasional Pepto-Bismol.  She has not been on any antiestrogen therapy recently, but is willing to try.  Appetite and energy are normal, no unintentional weight loss.  Denies hot flashes.  She has occasional bone pain in her legs and hips related to arthritis and osteoporosis.  She is on Prolia.  She has an occasional dry cough related to "allergies" also has known emphysema.  She received her Covid vaccine but did not get the flu shot this year.  No recent cough, chest pain, dyspnea.  No residual side effects from chemo.   ONCOLOGY TREATMENT TEAM:  1. Surgeon[facility]  [facility] 2. Medical Oncologist: Dr. FBurr Medico 3. Radiation Oncologist: Dr. SIsidore Moos   PAST MEDICAL/SURGICAL HISTORY:  Past Medical History:  Diagnosis  Date  . Arthritis    wrist, shoulder   . Cancer ([place]) 08/2019   left breast IDC  . Diverticulitis   . Endometrial polyp   . Family history of breast cancer   . GERD (gastroesophageal reflux disease)    occasional - diet controlled  . Hx of adenomatous colonic polyps   . Hyperlipidemia   . IBS (irritable bowel syndrome)   . Missed abortion    x 1 - resolved - no surgery  . Osteoporosis   . SVD (spontaneous vaginal delivery)    x 1  . Transient vision disturbance 2019   was evaluated by ED, no findings  . Vitamin D deficiency    Past Surgical History:  Procedure Laterality Date  . BREAST LUMPECTOMY WITH RADIOACTIVE SEED AND  SENTINEL LYMPH NODE BIOPSY Left 10/06/2019   Procedure: LEFT BREAST LUMPECTOMY  X2 WITH RADIOACTIVE SEED X2  AND SENTINEL LYMPH NODE BIOPSY;  Surgeon: Jovita Kussmaul, MD;  Location: [place];  Service: General;  Laterality: Left;  . BREAST LUMPECTOMY WITH RADIOACTIVE SEED LOCALIZATION Right 10/06/2019   Procedure: RIGHT BREAST LUMPECTOMY WITH RADIOACTIVE SEED LOCALIZATION;  Surgeon: Jovita Kussmaul, MD;  Location: [place];  Service: General;  Laterality: Right;  . COLONOSCOPY    . DIAGNOSTIC LAPAROSCOPY     cysts  . DILATATION & CURETTAGE/HYSTEROSCOPY WITH TRUECLEAR N/A 10/31/2013   Procedure: DILATATION & CURETTAGE/HYSTEROSCOPY WITH TRUCLEAR;  Surgeon: Marylynn Pearson, MD;  Location: [facility];  Service: Gynecology;  Laterality: N/A;  . DILATION AND CURETTAGE OF UTERUS    . MASS EXCISION Left 11/06/2019   Procedure: EXCISION NIPPLE AND AREOLA  MARGIN LEFT BREAST;  Surgeon: Jovita Kussmaul, MD;  Location: [place];  Service: General;  Laterality: Left;  . TONSILLECTOMY  1952  . WISDOM TOOTH EXTRACTION       ALLERGIES:  Allergies  Allergen Reactions  . Taxotere [Docetaxel] Swelling  . Abraxane [Paclitaxel Protein-Bound Part] Other (See Comments)    3 days after receiving she had one episode of very high BP and HR. This happened again 2 days later during the night.   Marland Kitchen Crestor [Rosuvastatin Calcium] Itching    GI upset  . Lipitor [Atorvastatin Calcium] Itching  . Nsaids Other (See Comments)    GI upset  . Pravastatin Itching    GI upset  . Sulfonamide Derivatives Rash    Hives, itiching     CURRENT MEDICATIONS:  Outpatient Encounter Medications as of 06/06/2020  Medication Sig Note  . acetaminophen (TYLENOL) 500 MG tablet Take 500 mg by mouth every 6 (six) hours as needed for headache.   . denosumab (PROLIA) 60 MG/ML SOSY injection Inject 60 mg into the skin every 6 (six) months.   . esomeprazole (NEXIUM) 20 MG capsule Take 20 mg by  mouth daily as needed (acid reflux).    Marland Kitchen loratadine (CLARITIN) 10 MG tablet Take 10 mg by mouth daily as needed for allergies.    Marland Kitchen PRESCRIPTION MEDICATION Apply 1 application topically at bedtime. Cream provided by hospital for breast blisters after radiation   . Thiamine HCl (VITAMIN B-1 PO) Take 1 tablet by mouth daily.   . Vitamin D, Ergocalciferol, (DRISDOL) 1.25 MG (50000 UNIT) CAPS capsule Take 1 capsule (50,000 Units total) by mouth every 7 (seven) days. 02/12/2020: Hasn't started  . tamoxifen (NOLVADEX) 20 MG tablet Take 1 tablet (20 mg total) by mouth daily. Okay to split in 2 divided doses   . [DISCONTINUED]  exemestane (AROMASIN) 25 MG tablet Take 1 tablet (25 mg total) by mouth daily after breakfast.   . [DISCONTINUED] omeprazole (PRILOSEC) 40 MG capsule Take 40 mg by mouth daily.     . [DISCONTINUED] pravastatin (PRAVACHOL) 40 MG tablet Take 1 tablet (40 mg total) by mouth every evening.   . [DISCONTINUED] prochlorperazine (COMPAZINE) 10 MG tablet Take 1 tablet (10 mg total) by mouth every 6 (six) hours as needed (Nausea or vomiting).    No facility-administered encounter medications on file as of 06/06/2020.     ONCOLOGIC FAMILY HISTORY:  Family History  Problem Relation Age of Onset  . Hyperlipidemia Mother   . Hypertension Mother   . Diabetes Mother        type ll  . Leukemia Mother 75  . Lung cancer Father   . Hyperlipidemia Father   . Breast cancer Cousin 30       maternal first couin  . Heart disease Other   . Hypertension Other   . Cancer Other   . Depression Brother   . Breast cancer Maternal Aunt 72  . Uterine cancer Paternal Grandmother   . Breast cancer Maternal Aunt 72  . Lung cancer Maternal Grandfather   . Cancer Maternal Grandfather        gastric cancer?  . Cancer Maternal Uncle        unknown  . Brain cancer Paternal Uncle   . Throat cancer Paternal Uncle      GENETIC COUNSELING/TESTING: Patient declined  SOCIAL HISTORY:  Sophia Sperry Fenter  is married and lives with her [place], [place].  She has children and grandchildren.  Ms. Bracken is currently not working.  She denies any current or history of tobacco use or illicit drug use. She drinks 1-2 glasses of wine with dinner.     PHYSICAL EXAMINATION:  Vital Signs:   Vitals:   06/06/20 0930  BP: (!) 165/70  Pulse: 72  Resp: 18  Temp: 97.8 F (36.6 C)  SpO2: 96%   Filed Weights   06/06/20 0930  Weight: 180 lb 11.2 oz (82 kg)   General: Well-nourished, well-appearing female in no acute distress.   HEENT:  Sclerae anicteric Lymph: No cervical, supraclavicular, or infraclavicular lymphadenopathy noted on palpation.  Cardiovascular: Regular rate and rhythm. Respiratory: Clear to auscultation bilaterally.  GI: Abdomen soft and round; non-tender, non-distended. Bowel sounds normoactive.  Neuro: No focal deficits. Steady gait.  Psych: Mood and affect normal and appropriate for situation.  Extremities: No edema. MSK: No focal spinal tenderness to palpation.   Skin: Warm and dry.  Small dark nevus with red center on left posterior forearm Breast: S/p left lumpectomy and radiation.  Incisions completely healed.  Mild scar tissue in the central left breast.  No palpable mass in either breast or axilla that I could appreciate.  Multiple moles and seborrheic keratoses bilaterally on the breast and chest wall  LABORATORY DATA:  None for this visit.  DIAGNOSTIC IMAGING:  None for this visit.      ASSESSMENT AND PLAN:  Ms.. Cocuzza is a pleasant 74 y.o. female with Stage 1A left breast invasive ductal carcinoma, ER+/PR+/HER2-, diagnosed in 09/18/2019, treated with lumpectomy, reexcision surgery, adjuvant chemo (only received 1 dose), adjuvant radiation, and limited trial of antiestrogen therapy with tamoxifen in 03/2020.  She presents to the Survivorship Clinic for our initial meeting and routine follow-up post-completion of treatment for breast cancer.    1.  Stage IA left breast cancer:  Ms.  Wurth has recovered well from definitive treatment for breast cancer. Breast exam shows no clinical concern for recurrence. She will follow-up with her medical oncologist, Dr. Mosetta Putt in 08/2020 with history and physical exam per surveillance protocol.  We discussed antiestrogen therapy including the rationale, potential side effects, and symptom management at length today.  She had a mild IBS flare after tamoxifen which has nearly resolved.  She plans to restart tamoxifen 10 mg a.m./10 mg p.m. and see how she tolerates this.  If she does not tolerate, she knows to call our clinic and we will change her to exemestane.  She is on Prolia for osteoporosis.  She will have a repeat DEXA in 08/2020.  We discussed if she cannot tolerate exemestane, we may try anastrozole/letrozole.  If she does not tolerate any antiestrogen therapy, we will discontinue and do close surveillance with the possibility of including screening breast MRI 6 months after mammogram.  She is comfortable with this plan.  Plan to start tamoxifen next week.  Ms. Delamora was encouraged to contact Dr. Mosetta Putt or myself with any vaginal bleeding while taking Tamoxifen.  She has follow-up with GYN soon. Today, a comprehensive survivorship care plan and treatment summary was reviewed with the patient today detailing her breast cancer diagnosis, treatment course, potential late/long-term effects of treatment, appropriate follow-up care with recommendations for the future, and patient education resources.  A copy of this summary, along with a letter will be sent to the patient's primary care provider via In Basket message after today's visit.    2.  IBS flare: Her IBS flares are usually diet and stress related and include abdominal discomfort, bloating, and gas, no significant N/V/C/D.  Symptoms resolve with BM and Pepto-Bismol PRN.  She knows to contact GI if symptoms worsen or fail to improve.  3. Bone health:  Given Ms.  Dimmer's age/history of breast cancer and her current treatment regimen including anti-estrogen therapy with AI, she is at risk for bone demineralization.  Her last DEXA scan was 09/01/2018 which showed osteopenia of the spine and left and right femoral neck.  She prefers to try tamoxifen first due to osteopenia.  If she does not tolerate she agrees to try exemestane and monitor her bone density closely, she is worried about pain and fractures.  She continues Prolia injections.  Repeat DEXA in 08/2020, in the meantime, she was encouraged to increase her consumption of foods rich in calcium, as well as increase her weight-bearing activities.  She was given education on specific activities to promote bone health.    4. Cancer screening:  Due to Ms. Blossom's history and her age, she should receive screening for skin cancers, colon cancer, and gynecologic cancers. The information and recommendations are listed on the patient's comprehensive care plan/treatment summary and were reviewed in detail with the patient.    5. Health maintenance and wellness promotion: Ms. Cozart was encouraged to consume 5-7 servings of fruits and vegetables per day. We reviewed the "Nutrition Rainbow" handout, as well as the handout "Take Control of Your Health and Reduce Your Cancer Risk" from the [employer] Society.  She was also encouraged to engage in moderate to vigorous exercise for 30 minutes per day most days of the week. We discussed the [employer] fitness program, which is designed for cancer survivors to help them become more physically fit after cancer treatments.  She was instructed to limit her alcohol consumption and continue to abstain from tobacco use.     6.  Support services/counseling: It is not uncommon for this period of the patient's cancer care trajectory to be one of many emotions and stressors.  We discussed an opportunity for her to participate in the next session of [facility] ("Finding Your New Normal")  support group series designed for patients after they have completed treatment.   Ms. Hasler was encouraged to take advantage of our many other support services programs, support groups, and/or counseling in coping with her new life as a cancer survivor after completing anti-cancer treatment.  She was offered support today through active listening and expressive supportive counseling.  She was given information regarding our available services and encouraged to contact me with any questions or for help enrolling in any of our support group/programs.    Dispo:   -Restart tamoxifen, 10 mg twice daily, notify us if not tolerating, we will then switch to exemestane -Return to cancer center 08/2020  -Repeat CT chest, DEXA, and mammogram due in 08/2020 before next visit -Mammogram due in 08/2020 -Follow up with surgery as needed -She is welcome to return back to the Survivorship Clinic at any time; no additional follow-up needed at this time.  -Consider referral back to survivorship as a long-term survivor for continued surveillance   Orders Placed This Encounter  Procedures  . MM DIAG BREAST TOMO BILATERAL    Standing Status:   Future    Standing Expiration Date:   06/06/2021    Order Specific Question:   Reason for Exam (SYMPTOM  OR DIAGNOSIS REQUIRED)    Answer:   History of left breast cancer s/p lumpectomy, re-excision, radiation    Order Specific Question:   Preferred imaging location?    Answer:   [facility]  . DG Bone Density    Standing Status:   Future    Standing Expiration Date:   06/06/2021    Order Specific Question:   Reason for Exam (SYMPTOM  OR DIAGNOSIS REQUIRED)    Answer:   Osteopenia, on Prolia and antiestrogen therapy    Order Specific Question:   Preferred imaging location?    Answer:   [facility]   A total of (50) minutes of face-to-face time was spent with this patient with greater than 50% of that time in counseling and care-coordination.   Cira Rue,  NP Survivorship Program [facility] [phone]   Note: PRIMARY CARE PROVIDER Eulas Post, [place] [phone]

## 2020-06-06 ENCOUNTER — Other Ambulatory Visit: Payer: Self-pay

## 2020-06-06 ENCOUNTER — Inpatient Hospital Stay: Payer: Medicare Other | Attending: Hematology | Admitting: Nurse Practitioner

## 2020-06-06 ENCOUNTER — Encounter: Payer: Self-pay | Admitting: Nurse Practitioner

## 2020-06-06 VITALS — BP 165/70 | HR 72 | Temp 97.8°F | Resp 18 | Ht 65.0 in | Wt 180.7 lb

## 2020-06-06 DIAGNOSIS — Z808 Family history of malignant neoplasm of other organs or systems: Secondary | ICD-10-CM | POA: Insufficient documentation

## 2020-06-06 DIAGNOSIS — Z833 Family history of diabetes mellitus: Secondary | ICD-10-CM | POA: Insufficient documentation

## 2020-06-06 DIAGNOSIS — K589 Irritable bowel syndrome without diarrhea: Secondary | ICD-10-CM | POA: Diagnosis not present

## 2020-06-06 DIAGNOSIS — Z9221 Personal history of antineoplastic chemotherapy: Secondary | ICD-10-CM | POA: Diagnosis not present

## 2020-06-06 DIAGNOSIS — Z8349 Family history of other endocrine, nutritional and metabolic diseases: Secondary | ICD-10-CM | POA: Insufficient documentation

## 2020-06-06 DIAGNOSIS — Z170 Estrogen receptor positive status [ER+]: Secondary | ICD-10-CM | POA: Insufficient documentation

## 2020-06-06 DIAGNOSIS — Z8049 Family history of malignant neoplasm of other genital organs: Secondary | ICD-10-CM | POA: Insufficient documentation

## 2020-06-06 DIAGNOSIS — Z8249 Family history of ischemic heart disease and other diseases of the circulatory system: Secondary | ICD-10-CM | POA: Insufficient documentation

## 2020-06-06 DIAGNOSIS — M8589 Other specified disorders of bone density and structure, multiple sites: Secondary | ICD-10-CM

## 2020-06-06 DIAGNOSIS — C50112 Malignant neoplasm of central portion of left female breast: Secondary | ICD-10-CM | POA: Diagnosis not present

## 2020-06-06 DIAGNOSIS — Z79899 Other long term (current) drug therapy: Secondary | ICD-10-CM | POA: Insufficient documentation

## 2020-06-06 DIAGNOSIS — Z79810 Long term (current) use of selective estrogen receptor modulators (SERMs): Secondary | ICD-10-CM | POA: Insufficient documentation

## 2020-06-06 DIAGNOSIS — Z801 Family history of malignant neoplasm of trachea, bronchus and lung: Secondary | ICD-10-CM | POA: Insufficient documentation

## 2020-06-06 DIAGNOSIS — Z923 Personal history of irradiation: Secondary | ICD-10-CM | POA: Diagnosis not present

## 2020-06-06 DIAGNOSIS — Z803 Family history of malignant neoplasm of breast: Secondary | ICD-10-CM | POA: Insufficient documentation

## 2020-06-06 MED ORDER — TAMOXIFEN CITRATE 20 MG PO TABS: 20.0000 mg | ORAL_TABLET | Freq: Every day | ORAL | 0 refills | Status: DC

## 2020-06-24 ENCOUNTER — Telehealth: Payer: Self-pay | Admitting: Family Medicine

## 2020-06-24 NOTE — Telephone Encounter (Signed)
Please advise 

## 2020-06-24 NOTE — Telephone Encounter (Signed)
Pt called to get her appointment ready for her prolia shot. Said she needs insurance approved.  Please call pt at [phone]  Please advise

## 2020-06-28 ENCOUNTER — Telehealth: Payer: Self-pay | Admitting: Family Medicine

## 2020-06-28 NOTE — Telephone Encounter (Signed)
Pt is calling to remind CMA that she will be due for her prolia shot and would like a call sometime next week to schedule and call the insurance company to get it approved.   She will be going out of town and would like to get it taken care of.  Please advise

## 2020-07-02 NOTE — Telephone Encounter (Signed)
Is she good to go to schedule

## 2020-07-03 NOTE — Telephone Encounter (Signed)
Patient scheduled.  See phone note.

## 2020-07-03 NOTE — Telephone Encounter (Signed)
Patient scheduled for 08/05/20.

## 2020-07-19 ENCOUNTER — Telehealth: Payer: Self-pay

## 2020-07-19 NOTE — Telephone Encounter (Signed)
I returned Shelly [name]'s call.  She has read the side effects of exemestane and is concerned with bone fx, dizziness, and bone pain.  She is scheduled for bone scan on 10/4.  She is currently on prolia and is unable to take calcium supplements secondary to her IBS.  She has an appt with Dr. Burr Medico on 10/22.  Shelly Brissette has decided to wait until after bone scan to discuss further with Dr. Burr Medico.

## 2020-07-23 DIAGNOSIS — Z170 Estrogen receptor positive status [ER+]: Secondary | ICD-10-CM | POA: Diagnosis not present

## 2020-07-23 DIAGNOSIS — C50412 Malignant neoplasm of upper-outer quadrant of left female breast: Secondary | ICD-10-CM | POA: Diagnosis not present

## 2020-08-05 ENCOUNTER — Other Ambulatory Visit: Payer: Self-pay

## 2020-08-05 ENCOUNTER — Ambulatory Visit (INDEPENDENT_AMBULATORY_CARE_PROVIDER_SITE_OTHER): Payer: Medicare Other

## 2020-08-05 DIAGNOSIS — M818 Other osteoporosis without current pathological fracture: Secondary | ICD-10-CM

## 2020-08-05 MED ORDER — DENOSUMAB 60 MG/ML ~~LOC~~ SOSY
60.0000 mg | PREFILLED_SYRINGE | Freq: Once | SUBCUTANEOUS | Status: AC
  Administered 2020-08-05: 60 mg via SUBCUTANEOUS

## 2020-08-05 NOTE — Progress Notes (Signed)
Per orders of Dr. Elease Hashimoto, injection of Prolia 60 mg/mL given by Wyvonne Lenz. Patient tolerated injection well.

## 2020-08-11 ENCOUNTER — Other Ambulatory Visit: Payer: Self-pay | Admitting: Hematology

## 2020-08-12 DIAGNOSIS — Z23 Encounter for immunization: Secondary | ICD-10-CM | POA: Diagnosis not present

## 2020-08-12 DIAGNOSIS — H04123 Dry eye syndrome of bilateral lacrimal glands: Secondary | ICD-10-CM | POA: Diagnosis not present

## 2020-08-13 ENCOUNTER — Other Ambulatory Visit: Payer: Self-pay

## 2020-08-14 ENCOUNTER — Encounter: Payer: Self-pay | Admitting: Family Medicine

## 2020-08-14 ENCOUNTER — Ambulatory Visit (INDEPENDENT_AMBULATORY_CARE_PROVIDER_SITE_OTHER): Payer: Medicare Other | Admitting: Family Medicine

## 2020-08-14 VITALS — BP 144/74 | HR 81 | Temp 97.7°F | Ht 64.2 in | Wt 180.1 lb

## 2020-08-14 DIAGNOSIS — Z1159 Encounter for screening for other viral diseases: Secondary | ICD-10-CM

## 2020-08-14 DIAGNOSIS — Z0000 Encounter for general adult medical examination without abnormal findings: Secondary | ICD-10-CM

## 2020-08-14 DIAGNOSIS — Z86010 Personal history of colonic polyps: Secondary | ICD-10-CM | POA: Diagnosis not present

## 2020-08-14 DIAGNOSIS — E785 Hyperlipidemia, unspecified: Secondary | ICD-10-CM

## 2020-08-14 DIAGNOSIS — Z23 Encounter for immunization: Secondary | ICD-10-CM | POA: Diagnosis not present

## 2020-08-14 DIAGNOSIS — Z860101 Personal history of adenomatous and serrated colon polyps: Secondary | ICD-10-CM | POA: Insufficient documentation

## 2020-08-14 DIAGNOSIS — R7303 Prediabetes: Secondary | ICD-10-CM

## 2020-08-14 DIAGNOSIS — T22211A Burn of second degree of right forearm, initial encounter: Secondary | ICD-10-CM | POA: Diagnosis not present

## 2020-08-14 DIAGNOSIS — R7309 Other abnormal glucose: Secondary | ICD-10-CM | POA: Diagnosis not present

## 2020-08-14 NOTE — Patient Instructions (Signed)
We will set up repeat colonoscopy  Consider second shingles vaccine at some point this year.

## 2020-08-14 NOTE — Progress Notes (Signed)
Established Patient Office Visit  Subjective:  Patient ID: Shelly [name], female    DOB: 07-08-1946  Age: 74 y.o. MRN: [id number]  CC:  Chief Complaint  Patient presents with  . Annual Exam    Doing well    HPI Shelly [name] presents for her subsequent annual wellness visit and medical follow-up.  She had recent breast cancer has had close follow-up with oncology.  She did not tolerate antiestrogen therapies including Aromasin and tamoxifen.  Her other medical problems include history of GERD, IBS, osteoporosis, and hyperlipidemia.  She also has history of prediabetes.  No polyuria or polydipsia.  No recent A1c.  She did not tolerate multiple statins previously.  She burned her right forearm recently when reaching into oven and reaching this against the metal rack in the oven.  This occurred about 3 days ago.  She had been cleaning daily with soap and water and applying topical antibiotic over-the-counter.  No signs of infection thus far.    She is on Prolia injections for osteoporosis.  She has scheduled follow-up DEXA scan.  She is overdue for repeat colonoscopy.  History of adenomatous colon polyps.  No history of hepatitis C screening.  She is low risk.  She had one shingles vaccine but never got her second.  1.  Risk factors based on Past Medical , Social, and Family history reviewed and as indicated above with no changes  2.  Limitations in physical activities None.  No recent falls.  3.  Depression/mood No active depression or anxiety issues  PHQ 2=0.  4.  Hearing No deficits  5.  ADLs independent in all.  6.  Cognitive function (orientation to time and place, language, writing, speech,memory) no short or long term memory issues.  Language and judgement intact.  7.  Home Safety no issues  8.  Height, weight, and visual acuity.all stable.  Sees optometrist regularly.   Wt Readings from Last 3 Encounters:  08/14/20 180 lb 1.6 oz (81.7 kg)  06/06/20 180 lb 11.2  oz (82 kg)  03/06/20 180 lb 14.4 oz (82.1 kg)    9.  Counseling discussed Counseled regarding age and gender appropriate preventative screenings and immunizations.  10. Recommendation of preventive services.  11. Labs based on risk factors  12. Care Plan- as below.  13. Other Providers Other Patient Care Team Members Specialty     Barbaraann Cao, OD Optometry  Jovita Kussmaul, MD General Surgery  Mauro Kaufmann, RN N/A  Rockwell Germany, RN N/A  Truitt Merle, MD Hematology  Alla Feeling, NP Nurse Practitioner  Eppie Gibson, MD Radiation Oncology      14. Written schedule of screening/prevention services given to patient.  Health Maintenance  Topic Date Due  . COLONOSCOPY  09/13/2019  . COVID-19 Vaccine (2 - Pfizer 2-dose series) 09/02/2020  . TETANUS/TDAP  05/14/2024  . INFLUENZA VACCINE  Completed  . DEXA SCAN  Completed  . Hepatitis C Screening  Completed  . PNA vac Low Risk Adult  Completed      Past Medical History:  Diagnosis Date  . Arthritis    wrist, shoulder   . Cancer ([place]) 08/2019   left breast IDC  . Diverticulitis   . Endometrial polyp   . Family history of breast cancer   . GERD (gastroesophageal reflux disease)    occasional - diet controlled  . Hx of adenomatous colonic polyps   . Hyperlipidemia   . IBS (irritable bowel syndrome)   .  Missed abortion    x 1 - resolved - no surgery  . Osteoporosis   . SVD (spontaneous vaginal delivery)    x 1  . Transient vision disturbance 2019   was evaluated by ED, no findings  . Vitamin D deficiency     Past Surgical History:  Procedure Laterality Date  . BREAST LUMPECTOMY WITH RADIOACTIVE SEED AND SENTINEL LYMPH NODE BIOPSY Left 10/06/2019   Procedure: LEFT BREAST LUMPECTOMY  X2 WITH RADIOACTIVE SEED X2  AND SENTINEL LYMPH NODE BIOPSY;  Surgeon: Jovita Kussmaul, MD;  Location: [facility];  Service: General;  Laterality: Left;  . BREAST LUMPECTOMY WITH RADIOACTIVE SEED  LOCALIZATION Right 10/06/2019   Procedure: RIGHT BREAST LUMPECTOMY WITH RADIOACTIVE SEED LOCALIZATION;  Surgeon: Jovita Kussmaul, MD;  Location: [place];  Service: General;  Laterality: Right;  . COLONOSCOPY    . DIAGNOSTIC LAPAROSCOPY     cysts  . DILATATION & CURETTAGE/HYSTEROSCOPY WITH TRUECLEAR N/A 10/31/2013   Procedure: DILATATION & CURETTAGE/HYSTEROSCOPY WITH TRUCLEAR;  Surgeon: Marylynn Pearson, MD;  Location: [facility];  Service: Gynecology;  Laterality: N/A;  . DILATION AND CURETTAGE OF UTERUS    . MASS EXCISION Left 11/06/2019   Procedure: EXCISION NIPPLE AND AREOLA  MARGIN LEFT BREAST;  Surgeon: Jovita Kussmaul, MD;  Location: [place];  Service: General;  Laterality: Left;  . TONSILLECTOMY  1952  . WISDOM TOOTH EXTRACTION      Family History  Problem Relation Age of Onset  . Hyperlipidemia Mother   . Hypertension Mother   . Diabetes Mother        type ll  . Leukemia Mother 32  . Lung cancer Father   . Hyperlipidemia Father   . Breast cancer Cousin 30       maternal first couin  . Heart disease Other   . Hypertension Other   . Cancer Other   . Depression Brother   . Breast cancer Maternal Aunt 77  . Uterine cancer Paternal Grandmother   . Breast cancer Maternal Aunt 1  . Lung cancer Maternal Grandfather   . Cancer Maternal Grandfather        gastric cancer?  . Cancer Maternal Uncle        unknown  . Brain cancer Paternal Uncle   . Throat cancer Paternal Uncle     Social History   Socioeconomic History  . Marital status: Married    Spouse name: Not on file  . Number of children: 1  . Years of education: Not on file  . Highest education level: Not on file  Occupational History  . Not on file  Tobacco Use  . Smoking status: Former Smoker    Packs/day: 0.10    Years: 20.00    Pack years: 2.00    Types: Cigarettes    Quit date: 05/15/2007    Years since quitting: 13.2  . Smokeless tobacco: Never Used  Vaping Use  .  Vaping Use: Never used  Substance and Sexual Activity  . Alcohol use: Yes    Alcohol/week: 14.0 standard drinks    Types: 14 Glasses of wine per week    Comment: 2 glasses wine every evening  . Drug use: No  . Sexual activity: Yes    Birth control/protection: Post-menopausal  Other Topics Concern  . Not on file  Social History Narrative   Lives with husband in 2 story house   Has one daughter, with 3 grandchildren, local, supportive  Enjoys gardening/walking outside with husband   Occasionally does home exercise calisthenics but not recently.   Well-balanced diet overall   Social Determinants of Health   Financial Resource Strain:   . Difficulty of Paying Living Expenses: Not on file  Food Insecurity:   . Worried About Charity fundraiser in the Last Year: Not on file  . Ran Out of Food in the Last Year: Not on file  Transportation Needs:   . Lack of Transportation (Medical): Not on file  . Lack of Transportation (Non-Medical): Not on file  Physical Activity:   . Days of Exercise per Week: Not on file  . Minutes of Exercise per Session: Not on file  Stress:   . Feeling of Stress : Not on file  Social Connections:   . Frequency of Communication with Friends and Family: Not on file  . Frequency of Social Gatherings with Friends and Family: Not on file  . Attends Religious Services: Not on file  . Active Member of Clubs or Organizations: Not on file  . Attends Archivist Meetings: Not on file  . Marital Status: Not on file  Intimate Partner Violence:   . Fear of Current or Ex-Partner: Not on file  . Emotionally Abused: Not on file  . Physically Abused: Not on file  . Sexually Abused: Not on file    Outpatient Medications Prior to Visit  Medication Sig Dispense Refill  . acetaminophen (TYLENOL) 500 MG tablet Take 500 mg by mouth every 6 (six) hours as needed for headache.    . denosumab (PROLIA) 60 MG/ML SOSY injection Inject 60 mg into the skin every 6 (six)  months.    . esomeprazole (NEXIUM) 20 MG capsule Take 20 mg by mouth daily as needed (acid reflux).     Marland Kitchen loratadine (CLARITIN) 10 MG tablet Take 10 mg by mouth daily as needed for allergies.     Marland Kitchen PRESCRIPTION MEDICATION Apply 1 application topically at bedtime. Cream provided by hospital for breast blisters after radiation    . Thiamine HCl (VITAMIN B-1 PO) Take 1 tablet by mouth daily.    . Vitamin D, Ergocalciferol, (DRISDOL) 1.25 MG (50000 UNIT) CAPS capsule Take 1 capsule (50,000 Units total) by mouth every 7 (seven) days. 12 capsule 1  . tamoxifen (NOLVADEX) 20 MG tablet Take 1 tablet (20 mg total) by mouth daily. Okay to split in 2 divided doses (Patient not taking: Reported on 08/14/2020) 30 tablet 0   No facility-administered medications prior to visit.    Allergies  Allergen Reactions  . Taxotere [Docetaxel] Swelling  . Abraxane [Paclitaxel Protein-Bound Part] Other (See Comments)    3 days after receiving she had one episode of very high BP and HR. This happened again 2 days later during the night.   Marland Kitchen Crestor [Rosuvastatin Calcium] Itching    GI upset  . Lipitor [Atorvastatin Calcium] Itching  . Nsaids Other (See Comments)    GI upset  . Pravastatin Itching    GI upset  . Sulfonamide Derivatives Rash    Hives, itiching    ROS Review of Systems  Constitutional: Negative for activity change, appetite change, fatigue, fever and unexpected weight change.  HENT: Negative for ear pain, hearing loss, sore throat and trouble swallowing.   Eyes: Negative for visual disturbance.  Respiratory: Negative for cough and shortness of breath.   Cardiovascular: Negative for chest pain and palpitations.  Gastrointestinal: Negative for abdominal pain, blood in stool, constipation and diarrhea.  Endocrine:  Negative for polydipsia and polyuria.  Genitourinary: Negative for dysuria and hematuria.  Musculoskeletal: Negative for back pain and myalgias.  Skin: Negative for rash.    Neurological: Negative for dizziness, syncope and headaches.  Hematological: Negative for adenopathy.  Psychiatric/Behavioral: Negative for confusion and dysphoric mood.      Objective:    Physical Exam Vitals reviewed.  Constitutional:      Appearance: Normal appearance. She is not toxic-appearing or diaphoretic.  HENT:     Head: Atraumatic.     Ears:     Comments: Small amount of cerumen in both canals but not obstructing    Mouth/Throat:     Pharynx: Oropharynx is clear. No oropharyngeal exudate.  Eyes:     Pupils: Pupils are equal, round, and reactive to light.  Cardiovascular:     Rate and Rhythm: Normal rate and regular rhythm.  Pulmonary:     Effort: Pulmonary effort is normal.     Breath sounds: Normal breath sounds.  Abdominal:     Palpations: Abdomen is soft. There is no mass.     Tenderness: There is no abdominal tenderness. There is no guarding or rebound.  Musculoskeletal:     Cervical back: Neck supple.     Right lower leg: No edema.     Left lower leg: No edema.  Lymphadenopathy:     Cervical: No cervical adenopathy.  Skin:    Findings: No rash.     Comments: She does have approximately 2 cm linear horizontal burn dorsum right forearm.  No signs of secondary infection.  This is second-degree  Neurological:     General: No focal deficit present.     Mental Status: She is alert.     Cranial Nerves: No cranial nerve deficit.  Psychiatric:        Mood and Affect: Mood normal.        Thought Content: Thought content normal.     BP (!) 144/74   Pulse 81   Temp 97.7 F (36.5 C) (Oral)   Ht 5' 4.2" (1.631 m)   Wt 180 lb 1.6 oz (81.7 kg)   SpO2 94%   BMI 30.72 kg/m  Wt Readings from Last 3 Encounters:  08/14/20 180 lb 1.6 oz (81.7 kg)  06/06/20 180 lb 11.2 oz (82 kg)  03/06/20 180 lb 14.4 oz (82.1 kg)     Health Maintenance Due  Topic Date Due  . Hepatitis C Screening  Never done  . COVID-19 Vaccine (1) Never done  . COLONOSCOPY  09/13/2019   . INFLUENZA VACCINE  06/16/2020    There are no preventive care reminders to display for this patient.  Lab Results  Component Value Date   TSH 1.06 06/04/2011   Lab Results  Component Value Date   WBC 5.0 02/12/2020   HGB 13.9 02/12/2020   HCT 42.9 02/12/2020   MCV 91.9 02/12/2020   PLT 253 02/12/2020   Lab Results  Component Value Date   NA 140 02/12/2020   K 4.2 02/12/2020   CO2 27 02/12/2020   GLUCOSE 110 (H) 02/12/2020   BUN 19 02/12/2020   CREATININE 0.83 02/12/2020   BILITOT 0.3 01/03/2020   ALKPHOS 73 01/03/2020   AST 12 (L) 01/03/2020   ALT 17 01/03/2020   PROT 7.0 01/03/2020   ALBUMIN 3.9 01/03/2020   CALCIUM 8.8 (L) 02/12/2020   ANIONGAP 7 02/12/2020   GFR 66.91 01/07/2018   Lab Results  Component Value Date   CHOL 166 09/16/2011  Lab Results  Component Value Date   HDL 58.90 09/16/2011   Lab Results  Component Value Date   LDLCALC 87 09/16/2011   Lab Results  Component Value Date   TRIG 103.0 09/16/2011   Lab Results  Component Value Date   CHOLHDL 3 09/16/2011   Lab Results  Component Value Date   HGBA1C 6.1 (A) 08/05/2018      Assessment & Plan:   Problem List Items Addressed This Visit      Unprioritized   History of adenomatous polyp of colon   Prediabetes   Relevant Orders   Basic metabolic panel   Hemoglobin A1c   Hyperlipidemia - Primary    Other Visit Diagnoses    Encounter for hepatitis C screening test for low risk patient       Relevant Orders   Hep C Antibody    Health Maintenance: -flu vaccine given - set up repeat colonoscopy this year. -Tetanus due 2025. -check Hep C antibody -pneumonia vaccines given -DEXA per oncology -Covid vaccine given.   Second degree burn right forearm - no infection -clean daily with soap and water -apply small amount of vaseline -follow up for signs of infection    No orders of the defined types were placed in this encounter.   Follow-up: No follow-ups on file.     Carolann Littler, MD

## 2020-08-15 LAB — HEPATITIS C ANTIBODY
Hepatitis C Ab: NONREACTIVE
SIGNAL TO CUT-OFF: 0.01 (ref <1.00)

## 2020-08-15 LAB — HEMOGLOBIN A1C
Hgb A1c MFr Bld: 6.6 % of total Hgb — ABNORMAL HIGH (ref <5.7)
Mean Plasma Glucose: 143 (calc)
eAG (mmol/L): 7.9 (calc)

## 2020-08-15 LAB — BASIC METABOLIC PANEL
BUN: 18 mg/dL (ref 7–25)
CO2: 27 mmol/L (ref 20–32)
Calcium: 9 mg/dL (ref 8.6–10.4)
Chloride: 101 mmol/L (ref 98–110)
Creat: 0.92 mg/dL (ref 0.60–0.93)
Glucose, Bld: 106 mg/dL — ABNORMAL HIGH (ref 65–99)
Potassium: 4.2 mmol/L (ref 3.5–5.3)
Sodium: 137 mmol/L (ref 135–146)

## 2020-08-19 ENCOUNTER — Ambulatory Visit
Admission: RE | Admit: 2020-08-19 | Discharge: 2020-08-19 | Disposition: A | Discharge status: Home / Self Care | Payer: Medicare Other | Source: Ambulatory Visit | Attending: Nurse Practitioner | Admitting: Nurse Practitioner

## 2020-08-19 ENCOUNTER — Other Ambulatory Visit: Payer: Self-pay

## 2020-08-19 DIAGNOSIS — Z170 Estrogen receptor positive status [ER+]: Secondary | ICD-10-CM

## 2020-08-19 DIAGNOSIS — R928 Other abnormal and inconclusive findings on diagnostic imaging of breast: Secondary | ICD-10-CM | POA: Diagnosis not present

## 2020-08-19 DIAGNOSIS — C50112 Malignant neoplasm of central portion of left female breast: Secondary | ICD-10-CM

## 2020-09-02 DIAGNOSIS — N958 Other specified menopausal and perimenopausal disorders: Secondary | ICD-10-CM | POA: Diagnosis not present

## 2020-09-02 DIAGNOSIS — M8588 Other specified disorders of bone density and structure, other site: Secondary | ICD-10-CM | POA: Diagnosis not present

## 2020-09-03 ENCOUNTER — Other Ambulatory Visit: Payer: Self-pay

## 2020-09-03 ENCOUNTER — Inpatient Hospital Stay: Payer: Medicare Other | Attending: Hematology

## 2020-09-03 ENCOUNTER — Ambulatory Visit (HOSPITAL_COMMUNITY)
Admission: RE | Admit: 2020-09-03 | Discharge: 2020-09-03 | Disposition: A | Discharge status: Home / Self Care | Payer: Medicare Other | Source: Ambulatory Visit | Attending: Hematology | Admitting: Hematology

## 2020-09-03 DIAGNOSIS — M810 Age-related osteoporosis without current pathological fracture: Secondary | ICD-10-CM | POA: Insufficient documentation

## 2020-09-03 DIAGNOSIS — J439 Emphysema, unspecified: Secondary | ICD-10-CM | POA: Diagnosis not present

## 2020-09-03 DIAGNOSIS — Z79899 Other long term (current) drug therapy: Secondary | ICD-10-CM | POA: Insufficient documentation

## 2020-09-03 DIAGNOSIS — R918 Other nonspecific abnormal finding of lung field: Secondary | ICD-10-CM | POA: Diagnosis not present

## 2020-09-03 DIAGNOSIS — K580 Irritable bowel syndrome with diarrhea: Secondary | ICD-10-CM | POA: Insufficient documentation

## 2020-09-03 DIAGNOSIS — E559 Vitamin D deficiency, unspecified: Secondary | ICD-10-CM | POA: Diagnosis not present

## 2020-09-03 DIAGNOSIS — Z170 Estrogen receptor positive status [ER+]: Secondary | ICD-10-CM | POA: Diagnosis not present

## 2020-09-03 DIAGNOSIS — G4700 Insomnia, unspecified: Secondary | ICD-10-CM | POA: Insufficient documentation

## 2020-09-03 DIAGNOSIS — K219 Gastro-esophageal reflux disease without esophagitis: Secondary | ICD-10-CM | POA: Diagnosis not present

## 2020-09-03 DIAGNOSIS — F419 Anxiety disorder, unspecified: Secondary | ICD-10-CM | POA: Diagnosis not present

## 2020-09-03 DIAGNOSIS — Z923 Personal history of irradiation: Secondary | ICD-10-CM | POA: Diagnosis not present

## 2020-09-03 DIAGNOSIS — C50112 Malignant neoplasm of central portion of left female breast: Secondary | ICD-10-CM | POA: Diagnosis not present

## 2020-09-03 DIAGNOSIS — Z79810 Long term (current) use of selective estrogen receptor modulators (SERMs): Secondary | ICD-10-CM | POA: Diagnosis not present

## 2020-09-03 DIAGNOSIS — Z9221 Personal history of antineoplastic chemotherapy: Secondary | ICD-10-CM | POA: Insufficient documentation

## 2020-09-03 LAB — CBC WITH DIFFERENTIAL (CANCER CENTER ONLY)
Abs Immature Granulocytes: 0.02 10*3/uL (ref 0.00–0.07)
Basophils Absolute: 0.1 10*3/uL (ref 0.0–0.1)
Basophils Relative: 1 %
Eosinophils Absolute: 0.1 10*3/uL (ref 0.0–0.5)
Eosinophils Relative: 2 %
HCT: 40.8 % (ref 36.0–46.0)
Hemoglobin: 13.4 g/dL (ref 12.0–15.0)
Immature Granulocytes: 0 %
Lymphocytes Relative: 21 %
Lymphs Abs: 1.2 10*3/uL (ref 0.7–4.0)
MCH: 28.8 pg (ref 26.0–34.0)
MCHC: 32.8 g/dL (ref 30.0–36.0)
MCV: 87.6 fL (ref 80.0–100.0)
Monocytes Absolute: 0.4 10*3/uL (ref 0.1–1.0)
Monocytes Relative: 7 %
Neutro Abs: 4 10*3/uL (ref 1.7–7.7)
Neutrophils Relative %: 69 %
Platelet Count: 265 10*3/uL (ref 150–400)
RBC: 4.66 MIL/uL (ref 3.87–5.11)
RDW: 13.9 % (ref 11.5–15.5)
WBC Count: 5.8 10*3/uL (ref 4.0–10.5)
nRBC: 0 % (ref 0.0–0.2)

## 2020-09-03 LAB — CMP (CANCER CENTER ONLY)
ALT: 13 U/L (ref 0–44)
AST: 14 U/L — ABNORMAL LOW (ref 15–41)
Albumin: 3.6 g/dL (ref 3.5–5.0)
Alkaline Phosphatase: 73 U/L (ref 38–126)
Anion gap: 7 (ref 5–15)
BUN: 17 mg/dL (ref 8–23)
CO2: 27 mmol/L (ref 22–32)
Calcium: 9.6 mg/dL (ref 8.9–10.3)
Chloride: 105 mmol/L (ref 98–111)
Creatinine: 0.84 mg/dL (ref 0.44–1.00)
GFR, Estimated: >60 mL/min
Glucose, Bld: 105 mg/dL — ABNORMAL HIGH (ref 70–99)
Potassium: 4.1 mmol/L (ref 3.5–5.1)
Sodium: 139 mmol/L (ref 135–145)
Total Bilirubin: 0.3 mg/dL (ref 0.3–1.2)
Total Protein: 6.9 g/dL (ref 6.5–8.1)

## 2020-09-04 NOTE — Progress Notes (Signed)
[place]   [phone] [phone]   Clinic Follow up Note   Patient Care Team: Eulas Post, MD as PCP - General (Family Medicine) Barbaraann Cao, [place] as Referring Physician (Optometry) Jovita Kussmaul, MD as Consulting Physician (General Surgery) Mauro Kaufmann, RN as Oncology Nurse Navigator Rockwell Germany, RN as Oncology Nurse Navigator Truitt Merle, MD as Consulting Physician (Hematology) Alla Feeling, NP as Nurse Practitioner (Nurse Practitioner) Eppie Gibson, MD as Attending Physician (Radiation Oncology)  Date of Service:  09/06/2020  CHIEF COMPLAINT: F/u of left breast cancer  SUMMARY OF ONCOLOGIC HISTORY: Oncology History Overview Note  Cancer Staging Cancer of central portion of left female breast [facility]) Staging form: Breast, AJCC 8th Edition - Clinical stage from 09/18/2019: Stage IA (cT1b, cN0, cM0, G2, ER+, PR+, HER2-) - Signed by Truitt Merle, MD on 09/18/2019 - Pathologic stage from 10/06/2019: Stage IA (pT1c, pN1a, cM0, G2, ER+, PR+, HER2-) - Signed by Truitt Merle, MD on 10/19/2019    Cancer of central portion of left female breast ([place])  08/24/2019 Mammogram   Diagnostic mammogram 08/24/19  IMPRESSION: 1. Left breast mass measuring 0.8 x 0.4 x 0.5 cm at the 1 o'clock retroareolar region is suspicious and likely corresponds to the area of distortion seen on mammography.   2. Left breast 3 o'clock retroareolar dilated duct with internal debris versus solid mass.   3. Right breast retroareolar focal asymmetry without sonographic correlate.   08/28/2019 Initial Biopsy   Final Diagnosis 08/28/19 1. Breast, left, needle core biopsy, 1 o'clock/retroareolar - INVASIVE DUCTAL CARCINOMA, GRADE II. - PERINEURAL INVASION. - SEE MICROSCOPIC DESCRIPTION. 2. Breast, left, needle core biopsy, 3 o'clock retroareolar - DUCTAL PAPILLOMA. - SEE MICROSCOPIC DESCRIPTION.   08/28/2019 Receptors her2   1. PROGNOSTIC INDICATORS  08/28/19 Results: IMMUNOHISTOCHEMICAL AND MORPHOMETRIC ANALYSIS PERFORMED MANUALLY The tumor cells are NEGATIVE for Her2 (0). Estrogen Receptor: 100%, POSITIVE, STRONG STAINING INTENSITY Progesterone Receptor: 50%, POSITIVE, STRONG STAINING INTENSITY Proliferation Marker Ki67: 12%   09/01/2019 Pathology Results   Diagnosis 09/01/19 Breast, right, needle core biopsy, retroareolar - COMPLEX SCLEROSING LESION WITH A PAPILLARY SCLEROSING LESION AND USUAL DUCTAL HYPERPLASIA. SEE NOTE - NEGATIVE FOR CARCINOMA   09/18/2019 Initial Diagnosis   Cancer of central portion of left female breast ([place])   09/18/2019 Cancer Staging   Staging form: Breast, AJCC 8th Edition - Clinical stage from 09/18/2019: Stage IA (cT1b, cN0, cM0, G2, ER+, PR+, HER2-) - Signed by Truitt Merle, MD on 09/18/2019   10/06/2019 Surgery   LEFT BREAST LUMPECTOMY  X2 WITH RADIOACTIVE SEED X2  AND SENTINEL LYMPH NODE BIOPSY and  RIGHT BREAST LUMPECTOMY WITH RADIOACTIVE SEED LOCALIZATION by Dr Marlou Starks  10/06/19    10/06/2019 Pathology Results   FINAL MICROSCOPIC DIAGNOSIS: 10/06/19   A. BREAST, RIGHT, LUMPECTOMY:  - Fibrocystic change with a small incidental intraductal papilloma and  usual ductal hyperplasia  - Biopsy site changes  - Negative for carcinoma   B. LYMPH NODE, LEFT #1, SENTINEL, BIOPSY:  - Metastatic carcinoma to a lymph node (1/1)  - Focus of metastatic carcinoma measures 2.5 mm and shows extranodal  extension   C. LYMPH NODE, LEFT #2, SENTINEL, BIOPSY:  - Lymph node, negative for carcinoma (0/1)   D. LYMPH NODE, LEFT #3, SENTINEL, BIOPSY:  - Lymph node, negative for carcinoma (0/1)   E. BREAST, LEFT, LUMPECTOMY:  - Invasive ductal carcinoma, grade 2, 1.8 cm  - Ductal carcinoma in situ, intermediate grade  - Invasive carcinoma  focally involves the anterior margin  - DCIS is less than 1 mm from the superior margin  - Background breast parenchyma with fibrocystic change, usual ductal  hyperplasia and  intraductal papilloma.  - Biopsy site changes  - See oncology table    10/06/2019 Miscellaneous   Her Mammaprint showed High Risk Luminal Type B, risk of recurrence in the next 10 years is 29% in high risk disease. MPI -0.335    10/06/2019 Cancer Staging   Staging form: Breast, AJCC 8th Edition - Pathologic stage from 10/06/2019: Stage IA (pT1c, pN1a, cM0, G2, ER+, PR+, HER2-) - Signed by Truitt Merle, MD on 10/19/2019   11/06/2019 Surgery   EXCISION NIPPLE AND AREOLA  MARGIN LEFT BREAST by Dr Marlou Starks  11/06/19    11/06/2019 Pathology Results    FINAL MICROSCOPIC DIAGNOSIS: 11/06/19  A. BREAST, LEFT CENTRAL, RE-EXCISION:  -  Residual ductal carcinoma in situ, intermediate grade  -  Margins uninvolved by carcinoma (0.4 cm; superior margin)  -  Atypical intraductal papilloma  -  Usual ductal hyperplasia  -  Benign skin with seborrheic keratosis  -  Previous surgical site changes  -  No invasive carcinoma identified    12/06/2019 - 12/20/2019 Chemotherapy   Adjuvant chemo TC q3weeks for 4 cycles starting 12/06/19. With first TC infusion she had allergy reaction to docetaxel and [name] was not given on 12/06/19, we discontinued.   -I started her on weekly Abraxane on 12/20/19, but 2 days after infusion she started to feel tachycardic, hot/flushed, and lightheaded. A few days later she had dysuria, diarrhea and abdominal cramps and intermittent chest pain. She opted to d/c chemo and proceed with RT.    01/25/2020 - 03/06/2020 Radiation Therapy   Adjuvant Radiation with Dr Isidore Moos 01/25/20-03/06/20   05/2020 -  Anti-estrogen oral therapy   Tamoxifen 77m daily starting in 03/2020 but held due to IBS flare, restarting in 05/2020    06/06/2020 Survivorship   Care plan delivered by [name] NP       CURRENT THERAPY:  Tamoxifen 251mdaily starting in 03/2020-06/06/20 and 1027m/20/21- 06/2020 and 2 weeks of Exemestane in 07/2020. Plan to restart in 09/2020 after starting Zoloft.    INTERVAL HISTORY:   Shelly Harklessder is here for a follow up of left breast cancer. She was last seen by me 6 months ago and seen by NP Lacie 3 months ago in interim. She presents to the clinic with her husband. She notes she is doing well. She notes improved energy. She notes he tried Tamoxifen 50m90md 10mg21m she notes she did not want to continue. She feels it exacerbated her IBS and she had burning sensation of her pelvis. She also tried Exemestane for 2 weeks but it made her feel more anxious. She denies cough or SOB. She notes she still has mild left axillary tenderness and numbness from surgery. She has mild Left arm limited ROM. She has her booster vaccine and flu shot.     REVIEW OF SYSTEMS:   Constitutional: Denies fevers, chills or abnormal weight loss Eyes: Denies blurriness of vision Ears, nose, mouth, throat, and face: Denies mucositis or sore throat Respiratory: Denies cough, dyspnea or wheezes Cardiovascular: Denies palpitation, chest discomfort or lower extremity swelling Gastrointestinal:  Denies nausea, heartburn or change in bowel habits Skin: Denies abnormal skin rashes MSK: (+) Mild limited ROM of left arm  Lymphatics: Denies new lymphadenopathy or easy bruising Neurological:Denies numbness, tingling or new weaknesses Behavioral/Psych: Mood is stable,  no new changes  All other systems were reviewed with the patient and are negative.  MEDICAL HISTORY:  Past Medical History:  Diagnosis Date  . Arthritis    wrist, shoulder   . Cancer ([place]) 08/2019   left breast IDC  . Diverticulitis   . Endometrial polyp   . Family history of breast cancer   . GERD (gastroesophageal reflux disease)    occasional - diet controlled  . Hx of adenomatous colonic polyps   . Hyperlipidemia   . IBS (irritable bowel syndrome)   . Missed abortion    x 1 - resolved - no surgery  . Osteoporosis   . SVD (spontaneous vaginal delivery)    x 1  . Transient vision disturbance 2019   was evaluated by ED,  no findings  . Vitamin D deficiency     SURGICAL HISTORY: Past Surgical History:  Procedure Laterality Date  . BREAST EXCISIONAL BIOPSY Right 08/2019   Benign, lesion   . BREAST LUMPECTOMY Left 09/2019  . BREAST LUMPECTOMY WITH RADIOACTIVE SEED AND SENTINEL LYMPH NODE BIOPSY Left 10/06/2019   Procedure: LEFT BREAST LUMPECTOMY  X2 WITH RADIOACTIVE SEED X2  AND SENTINEL LYMPH NODE BIOPSY;  Surgeon: Jovita Kussmaul, MD;  Location: [place];  Service: General;  Laterality: Left;  . BREAST LUMPECTOMY WITH RADIOACTIVE SEED LOCALIZATION Right 10/06/2019   Procedure: RIGHT BREAST LUMPECTOMY WITH RADIOACTIVE SEED LOCALIZATION;  Surgeon: Jovita Kussmaul, MD;  Location: [place];  Service: General;  Laterality: Right;  . COLONOSCOPY    . DIAGNOSTIC LAPAROSCOPY     cysts  . DILATATION & CURETTAGE/HYSTEROSCOPY WITH TRUECLEAR N/A 10/31/2013   Procedure: DILATATION & CURETTAGE/HYSTEROSCOPY WITH TRUCLEAR;  Surgeon: Marylynn Pearson, MD;  Location: [facility];  Service: Gynecology;  Laterality: N/A;  . DILATION AND CURETTAGE OF UTERUS    . MASS EXCISION Left 11/06/2019   Procedure: EXCISION NIPPLE AND AREOLA  MARGIN LEFT BREAST;  Surgeon: Jovita Kussmaul, MD;  Location: [place];  Service: General;  Laterality: Left;  . TONSILLECTOMY  1952  . WISDOM TOOTH EXTRACTION      I have reviewed the social history and family history with the patient and they are unchanged from previous note.  ALLERGIES:  is allergic to taxotere [docetaxel], abraxane [paclitaxel protein-bound part], crestor [rosuvastatin calcium], lipitor [atorvastatin calcium], nsaids, pravastatin, and sulfonamide derivatives.  MEDICATIONS:  Current Outpatient Medications  Medication Sig Dispense Refill  . acetaminophen (TYLENOL) 500 MG tablet Take 500 mg by mouth every 6 (six) hours as needed for headache.    . denosumab (PROLIA) 60 MG/ML SOSY injection Inject 60 mg into the skin every 6 (six)  months.    . esomeprazole (NEXIUM) 20 MG capsule Take 20 mg by mouth daily as needed (acid reflux).     Marland Kitchen loratadine (CLARITIN) 10 MG tablet Take 10 mg by mouth daily as needed for allergies.     Marland Kitchen PRESCRIPTION MEDICATION Apply 1 application topically at bedtime. Cream provided by hospital for breast blisters after radiation    . sertraline (ZOLOFT) 50 MG tablet Take 1 tablet (50 mg total) by mouth daily. 30 tablet 0  . Thiamine HCl (VITAMIN B-1 PO) Take 1 tablet by mouth daily.    . Vitamin D, Ergocalciferol, (DRISDOL) 1.25 MG (50000 UNIT) CAPS capsule Take 1 capsule (50,000 Units total) by mouth every 7 (seven) days. 12 capsule 1   No current facility-administered medications for this visit.    PHYSICAL EXAMINATION: ECOG PERFORMANCE  STATUS: 1 - Symptomatic but completely ambulatory  Vitals:   09/06/20 1251  BP: (!) 162/81  Pulse: 77  Resp: 18  Temp: (!) 97.4 F (36.3 C)  SpO2: 95%   Filed Weights   09/06/20 1251  Weight: 181 lb 11.2 oz (82.4 kg)    GENERAL:alert, no distress and comfortable SKIN: skin color, texture, turgor are normal, no rashes or significant lesions EYES: normal, Conjunctiva are pink and non-injected, sclera clear  NECK: supple, thyroid normal size, non-tender, without nodularity LYMPH:  no palpable lymphadenopathy in the cervical, axillary  LUNGS: clear to auscultation and percussion with normal breathing effort HEART: regular rate & rhythm and no murmurs and no lower extremity edema ABDOMEN:abdomen soft, non-tender and normal bowel sounds Musculoskeletal:no cyanosis of digits and no clubbing  NEURO: alert & oriented x 3 with fluent speech, no focal motor/sensory deficits  LABORATORY DATA:  I have reviewed the data as listed CBC Latest Ref Rng & Units 09/03/2020 02/12/2020 01/03/2020  WBC 4.0 - 10.5 K/uL 5.8 5.0 5.7  Hemoglobin 12.0 - 15.0 g/dL 13.4 13.9 13.3  Hematocrit 36 - 46 % 40.8 42.9 40.1  Platelets 150 - 400 K/uL 265 253 323     CMP Latest  Ref Rng & Units 09/03/2020 08/14/2020 02/12/2020  Glucose 70 - 99 mg/dL 105(H) 106(H) 110(H)  BUN 8 - 23 mg/dL 17 18 19   Creatinine 0.44 - 1.00 mg/dL 0.84 0.92 0.83  Sodium 135 - 145 mmol/L 139 137 140  Potassium 3.5 - 5.1 mmol/L 4.1 4.2 4.2  Chloride 98 - 111 mmol/L 105 101 106  CO2 22 - 32 mmol/L 27 27 27   Calcium 8.9 - 10.3 mg/dL 9.6 9.0 8.8(L)  Total Protein 6.5 - 8.1 g/dL 6.9 - -  Total Bilirubin 0.3 - 1.2 mg/dL 0.3 - -  Alkaline Phos 38 - 126 U/L 73 - -  AST 15 - 41 U/L 14(L) - -  ALT 0 - 44 U/L 13 - -      RADIOGRAPHIC STUDIES: I have personally reviewed the radiological images as listed and agreed with the findings in the report. No results found.   ASSESSMENT & PLAN:  Shelly [name] is a 74 y.o. female with    1.Cancer of central portion of left female breast, StageIA, c(T1bN0M0), ER/PR+, HER2-, GradeII, pT1cN1aM0, stage IA -She was diagnosed in 08/2019. She underwent B/l breast lumpectomy and SLNB with Dr. Marlou Starks on 10/06/19.Due to positive margins and 1/3 positive LNs she underwent re-excisional surgery on 11/06/19 which showed DCIS only, margins negative.She has high risk for recurrence based on Mammaprint. -She tried adjuvant chemo with TC and Abraxane but could not tolerate either. She did complete adjuvant RT  with Dr Isidore Moos 01/25/20-03/06/20.  -She did not want to pursue Genetic testing  -I started her on antiestrogen therapy with Tamoxifen in 03/2020. She took until 06/06/20 and 40m 06/04/20- 06/2020 but stopped due to exacerbated IBS symptoms. She also tried 2 weeks of Exemestane in 07/2020 but stopped due to increased Anxiety. I discussed given her high risk disease, I encouraged her to try again. I recommend starting SSRI with Zoloft before retrying Exemestane again. She is agreeable.  -She is otherwise clinically doing well. Labs reviewed from this week, CBC and CMP WNL except BG 105. Her 08/2020 Mammogram was benign.  -Continue surveillance. I will refer her to  PT for left axillary and arm tightness. She is agreeable.  -F/u in 3 months    2. Osteoporosis, Vitamin D deficiency  -Diagnosed  prior to 2007 in [place]. Last DEXA in 06/2015 (-3.2 at [facility]).  -On Prolia injections every 6 months since 2017-2018. Was restarted on 01/30/20 in our clinic.  -Continue high dose Vit D and Tums (for calcium)   3. Diarrhea and Acid Reflux  -Her BM are controlled on Librax and Nexium and imodium. No recent Diarrhea   4. Anxiety,insomnia -She is nervous about her cancer diagnosis andchemo.  -She has had episodes of panic attack. She did have trouble sleeping, etiology likely multifactorial.  -She is also on Ativan. She can use benadryl and Melatonin to help her sleep if needed. -She had increased anxiety on Exemestane. I recommend starting SSRI with Zoloft for 2 weeks. If tolerable will try restarting Exemestane. She is agreeable.    5. H/o smoking, Emphysema, abnormal CT chest in 01/2020 -She has h/o smoking.  -On her 02/12/20 CT Angio chest she has b/l ground glass changes and moderate emphysema. There is no past chest scan to compare this -I personally reviewed and discussed her 09/03/20 CT chest which showed 2.2 x 1.8 cm area of mixed density in the left upper lobe with surrounding area of groundglass changes. This could be related to her previous left breast radiation or inflammatory changes, although malignancy is not developed but less likely.  She is asymptomatic, no clinical concern for infection. - I will consult with Dr Isidore Moos and repeat CT chest in 3 months    PLAN: -I called in Zoloft today  -Send PT referral  -Lab and F/u in 3 months with CT chest a few days before -She will contact PCP about her overdue second Shingles vaccine -will let Dr. Isidore Moos weight in about her CT scan findings   No problem-specific Assessment & Plan notes found for this encounter.   Orders Placed This Encounter  Procedures  . CT Chest Wo Contrast     Standing Status:   Future    Standing Expiration Date:   09/06/2021    Order Specific Question:   Preferred imaging location?    Answer:   [facility]  . Ambulatory referral to Physical Therapy    Referral Priority:   Routine    Referral Type:   Physical Medicine    Referral Reason:   Specialty Services Required    Requested Specialty:   Physical Therapy    Number of Visits Requested:   1   All questions were answered. The patient knows to call the clinic with any problems, questions or concerns. No barriers to learning was detected. The total time spent in the appointment was 30 minutes.     Truitt Merle, MD 09/06/2020   I, Joslyn Devon, am acting as scribe for Truitt Merle, MD.   I have reviewed the above documentation for accuracy and completeness, and I agree with the above.

## 2020-09-06 ENCOUNTER — Ambulatory Visit: Payer: Medicare Other | Admitting: Hematology

## 2020-09-06 ENCOUNTER — Encounter: Payer: Self-pay | Admitting: Hematology

## 2020-09-06 ENCOUNTER — Other Ambulatory Visit: Payer: Self-pay

## 2020-09-06 ENCOUNTER — Inpatient Hospital Stay (HOSPITAL_BASED_OUTPATIENT_CLINIC_OR_DEPARTMENT_OTHER): Payer: Medicare Other | Admitting: Hematology

## 2020-09-06 VITALS — BP 162/81 | HR 77 | Temp 97.4°F | Resp 18 | Ht 64.2 in | Wt 181.7 lb

## 2020-09-06 DIAGNOSIS — Z170 Estrogen receptor positive status [ER+]: Secondary | ICD-10-CM

## 2020-09-06 DIAGNOSIS — Z923 Personal history of irradiation: Secondary | ICD-10-CM | POA: Diagnosis not present

## 2020-09-06 DIAGNOSIS — Z9221 Personal history of antineoplastic chemotherapy: Secondary | ICD-10-CM | POA: Diagnosis not present

## 2020-09-06 DIAGNOSIS — C50112 Malignant neoplasm of central portion of left female breast: Secondary | ICD-10-CM

## 2020-09-06 DIAGNOSIS — M810 Age-related osteoporosis without current pathological fracture: Secondary | ICD-10-CM | POA: Diagnosis not present

## 2020-09-06 DIAGNOSIS — Z79810 Long term (current) use of selective estrogen receptor modulators (SERMs): Secondary | ICD-10-CM | POA: Diagnosis not present

## 2020-09-06 MED ORDER — SERTRALINE HCL 50 MG PO TABS: 50.0000 mg | ORAL_TABLET | Freq: Every day | ORAL | 0 refills | Status: DC

## 2020-09-09 ENCOUNTER — Telehealth: Payer: Self-pay | Admitting: Hematology

## 2020-09-09 ENCOUNTER — Telehealth: Payer: Self-pay

## 2020-09-09 NOTE — Telephone Encounter (Signed)
-----   Message from Truitt Merle, MD sent at 09/09/2020 11:49 AM EDT ----- Thanks Judson Roch.  Santiago Glad, could you call pt and relay Dr. Pearlie Oyster comments? I have scheduled her f/u with repeated CT in 3 months  Krista Blue  ----- Message ----- From: Eppie Gibson, MD Sent: 09/09/2020   7:06 AM EDT To: Truitt Merle, MD  I agree w/ your plan. The changes in the Apex of the left lung and anterior left lung mimic the RT fields.  Thanks! ----- Message ----- From: Truitt Merle, MD Sent: 09/06/2020   4:48 PM EDT To: Eppie Gibson, MD  Sarah,  Could you review her recent CT chest? She had CT chest done in March 2021 during her ED visit for atypical chest pain. I did a f/u chest recently which showed a lung mass/consolidation in LUL. She is asymptomatic. Do you think this is radiation change? I recommended a f/u CT chest in 3 months, let me know if you have additional thoughts, or if you want to review in thoracic conference  Thanks  Krista Blue

## 2020-09-09 NOTE — Telephone Encounter (Signed)
Scheduled appointments per 10/22 los. Will mail updated calendar to patient with appointments.

## 2020-09-26 ENCOUNTER — Telehealth: Payer: Self-pay

## 2020-09-26 NOTE — Telephone Encounter (Signed)
Patient called stating that she hand her husband were exposed family member Saturday who is now positive for COVID-19.  She was requesting information on how long to wait to be tested. Patient was informed that usually 5-7 day after a positive exposure. She verbalized understanding. I attempted to schedule them for test but she deciled and states that she is very close to [facility] and will go there for testing. Patient was encouraged to reach out again if they were unsuccessful with scheduling.

## 2020-09-27 DIAGNOSIS — Z20822 Contact with and (suspected) exposure to covid-19: Secondary | ICD-10-CM | POA: Diagnosis not present

## 2020-10-05 ENCOUNTER — Other Ambulatory Visit: Payer: Self-pay | Admitting: Hematology

## 2020-10-06 IMAGING — CT CT ANGIO CHEST
2 of 6 series · 18 of 46 positions shown · IV contrast (APPLIED)
Comparison: Radiographs 02/12/2020.

CLINICAL DATA: Sudden onset of right-sided chest pain today
diaphoresis. Undergoing radiation therapy for breast cancer.

EXAM:
CT ANGIOGRAPHY CHEST WITH CONTRAST
TECHNIQUE: Multidetector CT imaging of the chest was performed using the
standard protocol during bolus administration of intravenous
contrast. Multiplanar CT image reconstructions and MIPs were
obtained to evaluate the vascular anatomy.
CONTRAST:  100mL OMNIPAQUE IOHEXOL 350 MG/ML SOLN

[Series 5: thins · axial · 0.82mm/px · z∈[+62,+335]mm · 16 of 301 slices shown]
[im 14/301  lung]
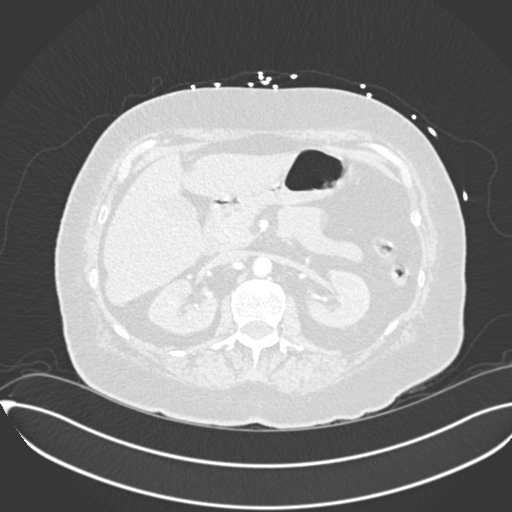
[im 40/301  soft-tissue]
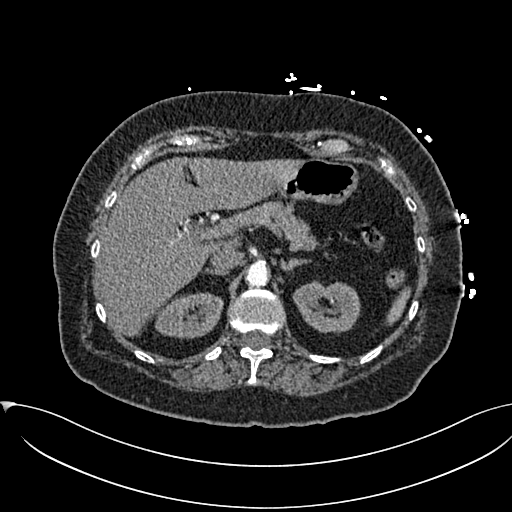
[im 53/301  lung]
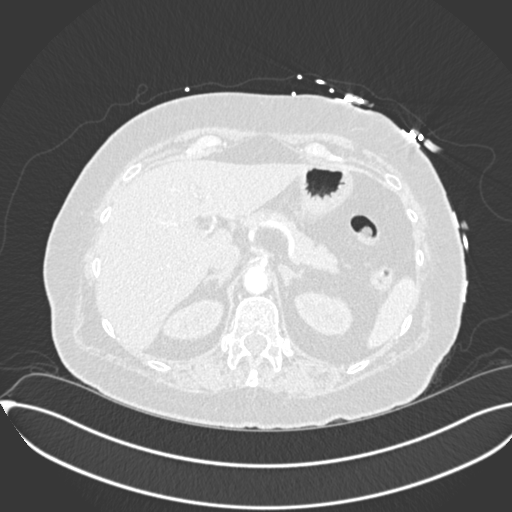
[im 66/301  soft-tissue]
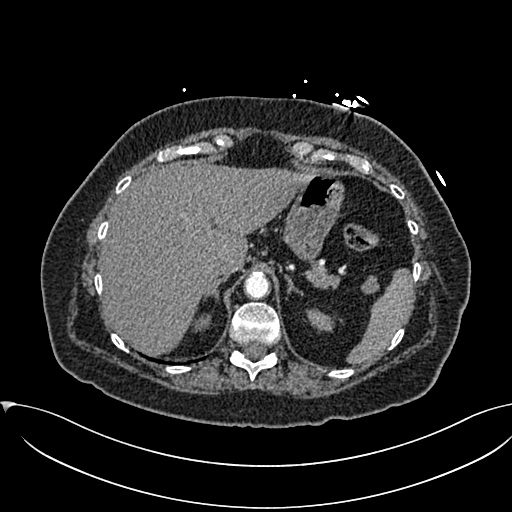
[im 92/301  lung]
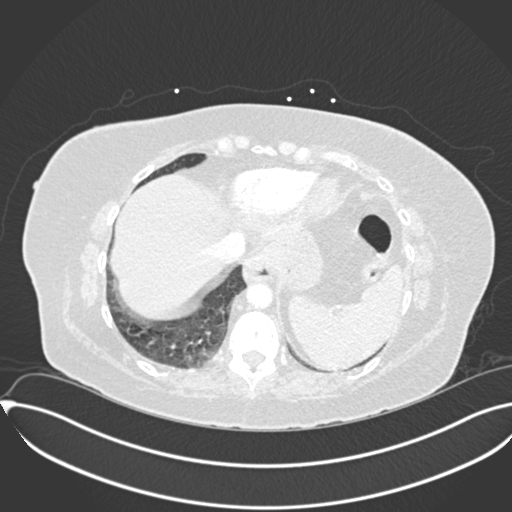
[im 105/301  soft-tissue]
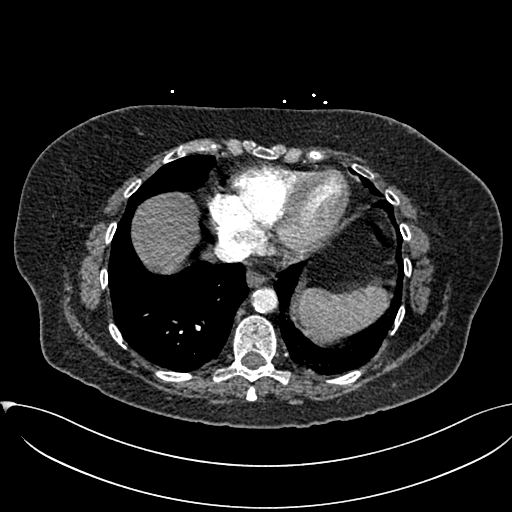
[im 118/301  lung]
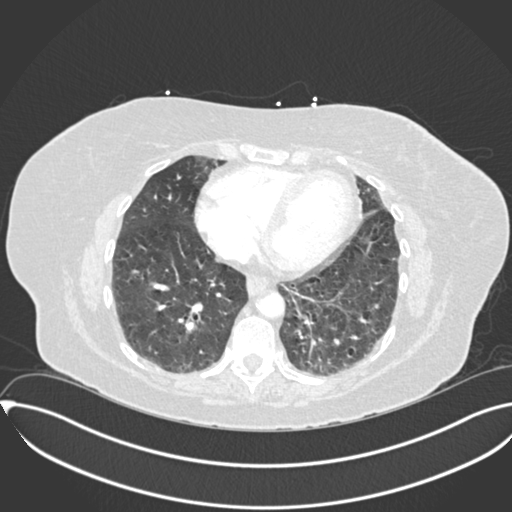
[im 144/301  soft-tissue]
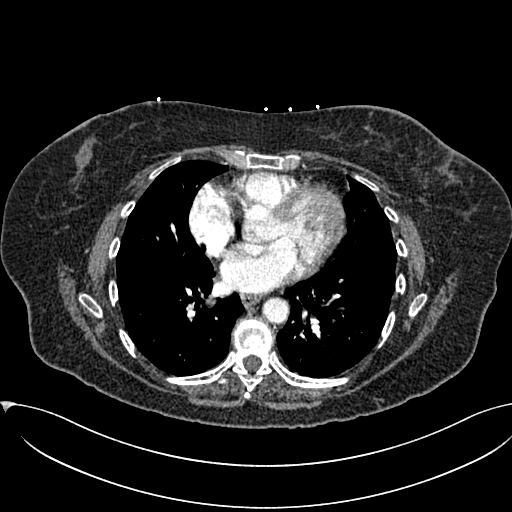
[im 157/301  lung]
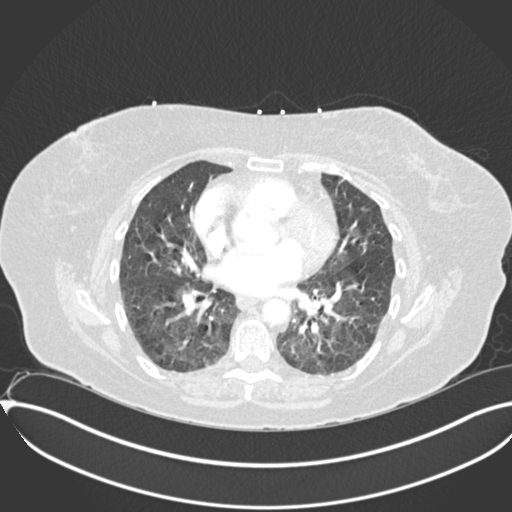
[im 183/301  soft-tissue]
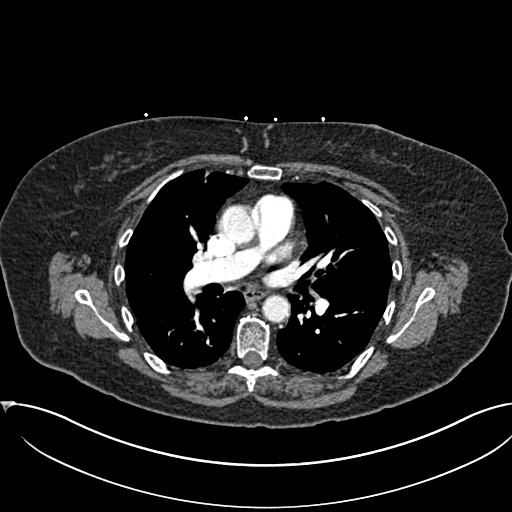
[im 196/301  lung]
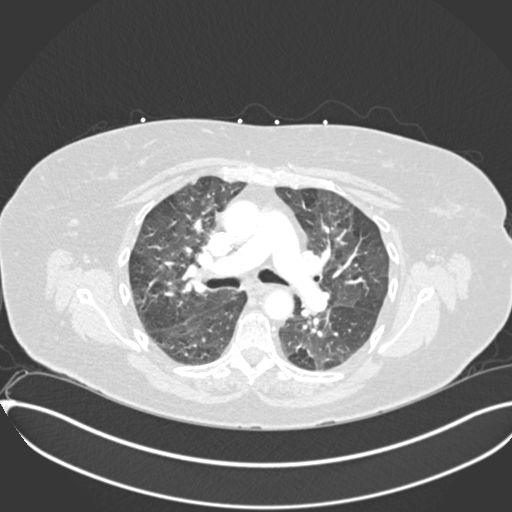
[im 209/301  soft-tissue]
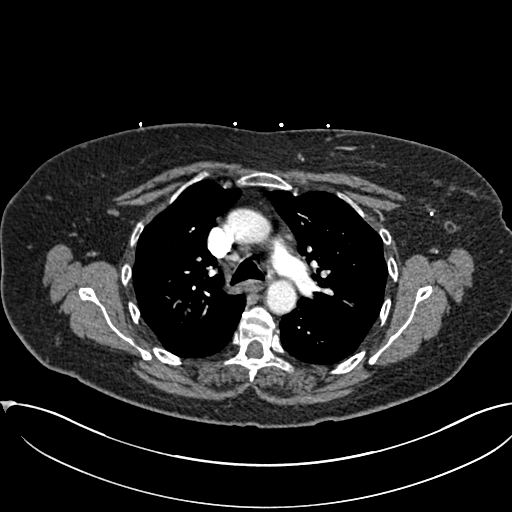
[im 235/301  lung]
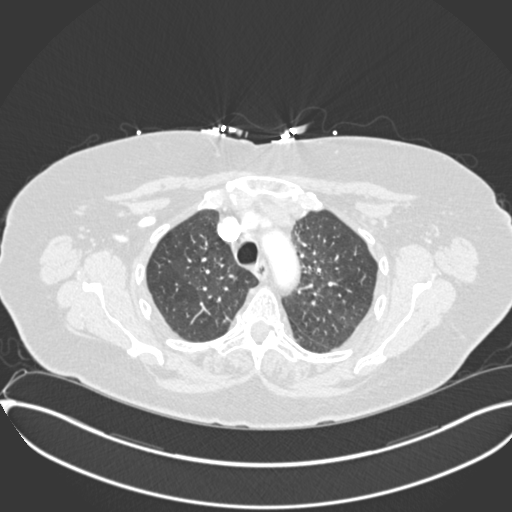
[im 248/301  soft-tissue]
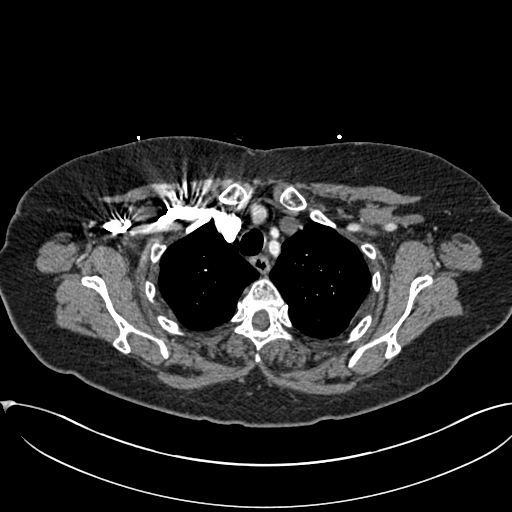
[im 261/301  lung]
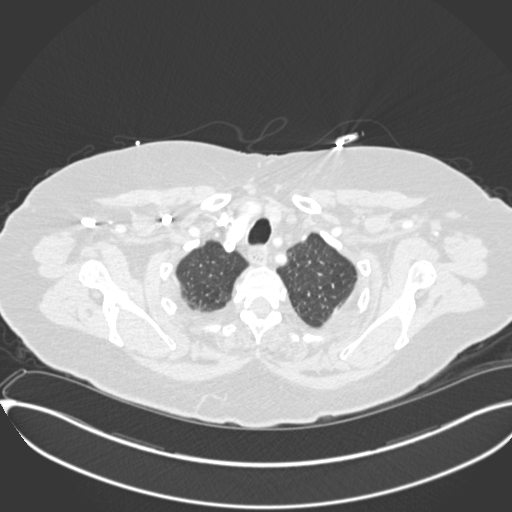
[im 287/301  soft-tissue]
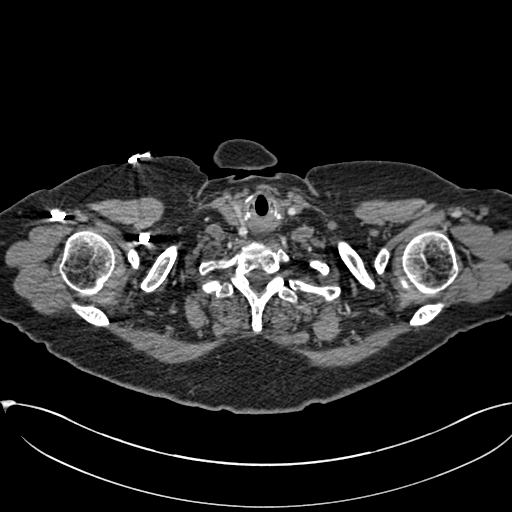

[Series 6: coronal mpr · coronal · 0.59mm/px · 2 of 106 slices shown]
[im 36/106  soft-tissue]
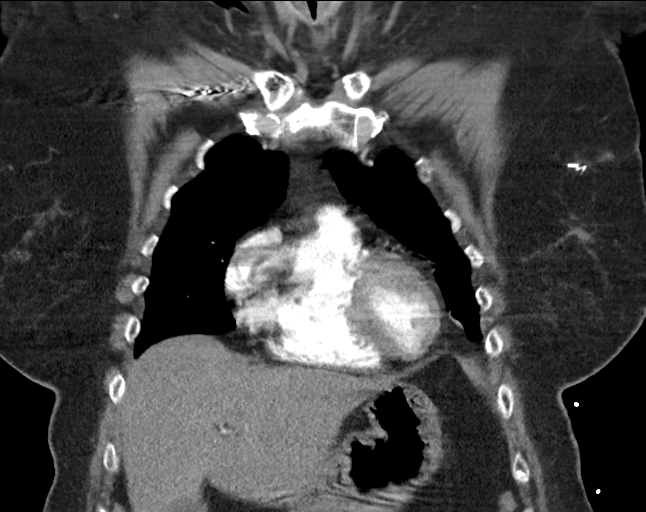
[im 71/106  soft-tissue]
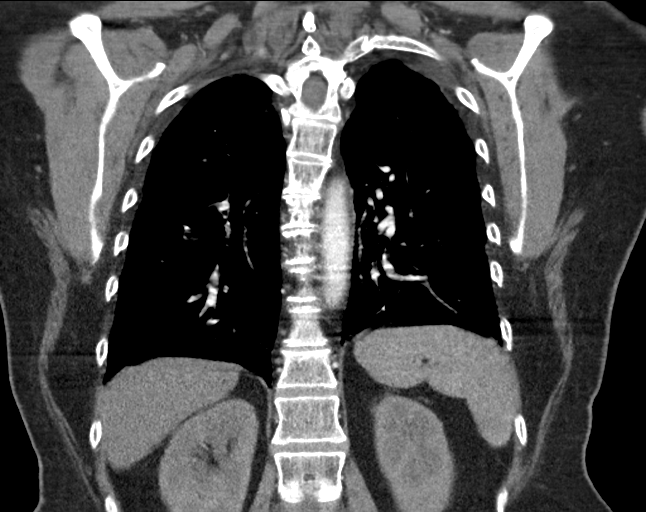

[18 of 46 positions shown; findings below may reference images not displayed]

FINDINGS: Cardiovascular: The pulmonary arteries are well opacified with
contrast to the level of the subsegmental branches. There is no
evidence of acute pulmonary embolism. Evaluation of the distal lower
lobe branches is limited by breathing artifact. Mild aortic and
coronary artery atherosclerosis without acute systemic arterial
abnormalities. The heart size is normal. There is no pericardial
effusion.

Mediastinum/Nodes: There are no enlarged mediastinal, hilar,
axillary or internal mammary lymph nodes. Postsurgical changes are
present within the left axilla and left breast.There is a small
hiatal hernia. The thyroid gland and trachea appear normal.

Lungs/Pleura: No pleural effusion or pneumothorax. Moderate
centrilobular emphysema with mild biapical scarring. There is mosaic
attenuation of the lungs with nonspecific patchy ground-glass
opacities bilaterally. No confluent airspace opacity or suspicious
nodularity.

Upper abdomen: The visualized upper abdomen appears unremarkable.

Musculoskeletal/Chest wall: There is no chest wall mass or
suspicious osseous finding. Mild superior endplate compression
deformity at T11 appears nonacute.

Review of the MIP images confirms the above findings.
IMPRESSION: 1. No evidence of acute pulmonary embolism or other acute vascular
findings in the chest.
2. Moderate centrilobular emphysema with nonspecific patchy
ground-glass opacities bilaterally which could indicate small
airways disease, edema or inflammation. No consolidation.
3. Aortic Atherosclerosis (CDDHD-S72.2) and Emphysema (CDDHD-KYO.J).

## 2020-10-11 ENCOUNTER — Other Ambulatory Visit: Payer: Self-pay | Admitting: Family Medicine

## 2020-10-25 ENCOUNTER — Other Ambulatory Visit: Payer: Self-pay

## 2020-10-25 ENCOUNTER — Ambulatory Visit: Payer: Medicare Other | Attending: Hematology | Admitting: Physical Therapy

## 2020-10-25 DIAGNOSIS — R252 Cramp and spasm: Secondary | ICD-10-CM

## 2020-10-25 DIAGNOSIS — M25612 Stiffness of left shoulder, not elsewhere classified: Secondary | ICD-10-CM | POA: Diagnosis not present

## 2020-10-25 DIAGNOSIS — M6281 Muscle weakness (generalized): Secondary | ICD-10-CM | POA: Diagnosis not present

## 2020-10-25 NOTE — Patient Instructions (Addendum)
  Blue light blocking glasses; [email or web address] No alcohol at night   Access Code: [email or web address] URL: [email or web address] Date: 11/03/2020 Prepared by: Jari Favre  Exercises Seated Thoracic Lumbar Extension with Pectoralis Stretch - 1 x daily - 7 x weekly - 1 sets - 10 reps - 5 hold Seated Gentle Upper Trapezius Stretch - 1 x daily - 7 x weekly - 1 sets - 3 reps - 30 sec hold Doorway Pec Stretch at 90 Degrees Abduction - 2 x daily - 7 x weekly - 1 sets - 3 reps - 30 sec hold Seated Diaphragmatic Breathing - 3 x daily - 7 x weekly - 1 sets - 10 reps Reclined Diaphragmatic Breathing - 1 x daily - 7 x weekly - 3 sets - 10 reps

## 2020-10-25 NOTE — Therapy (Addendum)
[facility] [address], [place], [place], [address] Phone: [phone]   Fax:  [phone]  Physical Therapy Evaluation  Patient Details  Name: Shelly [name] MRN: [id number] Date of Birth: [birth year] Referring Provider (PT): Truitt Merle, MD   Encounter Date: 10/25/2020   PT End of Session - 10/25/20 0942    Visit Number 1    Date for PT Re-Evaluation 12/20/20    Authorization Type medicare    PT Start Time 0930    PT Stop Time 1015    PT Time Calculation (min) 45 min    Activity Tolerance Patient tolerated treatment well    Behavior During Therapy [facility] for tasks assessed/performed           Past Medical History:  Diagnosis Date  . Arthritis    wrist, shoulder   . Cancer ([place]) 08/2019   left breast IDC  . Diverticulitis   . Endometrial polyp   . Family history of breast cancer   . GERD (gastroesophageal reflux disease)    occasional - diet controlled  . Hx of adenomatous colonic polyps   . Hyperlipidemia   . IBS (irritable bowel syndrome)   . Missed abortion    x 1 - resolved - no surgery  . Osteoporosis   . SVD (spontaneous vaginal delivery)    x 1  . Transient vision disturbance 2019   was evaluated by ED, no findings  . Vitamin D deficiency     Past Surgical History:  Procedure Laterality Date  . BREAST EXCISIONAL BIOPSY Right 08/2019   Benign, lesion   . BREAST LUMPECTOMY Left 09/2019  . BREAST LUMPECTOMY WITH RADIOACTIVE SEED AND SENTINEL LYMPH NODE BIOPSY Left 10/06/2019   Procedure: LEFT BREAST LUMPECTOMY  X2 WITH RADIOACTIVE SEED X2  AND SENTINEL LYMPH NODE BIOPSY;  Surgeon: Jovita Kussmaul, MD;  Location: [facility];  Service: General;  Laterality: Left;  . BREAST LUMPECTOMY WITH RADIOACTIVE SEED LOCALIZATION Right 10/06/2019   Procedure: RIGHT BREAST LUMPECTOMY WITH RADIOACTIVE SEED LOCALIZATION;  Surgeon: Jovita Kussmaul, MD;  Location: [place];   Service: General;  Laterality: Right;  . COLONOSCOPY    . DIAGNOSTIC LAPAROSCOPY     cysts  . DILATATION & CURETTAGE/HYSTEROSCOPY WITH TRUECLEAR N/A 10/31/2013   Procedure: DILATATION & CURETTAGE/HYSTEROSCOPY WITH TRUCLEAR;  Surgeon: Marylynn Pearson, MD;  Location: [facility];  Service: Gynecology;  Laterality: N/A;  . DILATION AND CURETTAGE OF UTERUS    . MASS EXCISION Left 11/06/2019   Procedure: EXCISION NIPPLE AND AREOLA  MARGIN LEFT BREAST;  Surgeon: Jovita Kussmaul, MD;  Location: [facility];  Service: General;  Laterality: Left;  . TONSILLECTOMY  1952  . WISDOM TOOTH EXTRACTION      There were no vitals filed for this visit.    Subjective Assessment - 10/25/20 0932    Subjective Pt is having pain in Rt shoulder superior scap and hard time reaching arm overhead.  Pt reports she wants to get a home exercise routine.  I want to get on the treadmill 15-20 minutes 2x/day.  I used to do an exercise routine but can't get motivated to do it. Pt states she cannot take the medicine for estrogen.  Problem emptying the bladder.  Pt feels worried because she cannot take the medication and feels stress. Nocturia ever 3-4/night.  Pt has reflux that also effects sleep    Limitations Lifting    Patient Stated Goals get energy,  walk on the TM, exercise routine that won't hurt me    Currently in Pain? No/denies              [facility] PT Assessment - 11/03/20 0001      Assessment   Medical Diagnosis C50.112,Z17.0 (ICD-10-CM) - Malignant neoplasm of central portion of left breast in female, estrogen receptor positive ([place])    Referring Provider (PT) Truitt Merle, MD    Onset Date/Surgical Date --   one year     Precautions   Precautions None      Balance Screen   Has the patient fallen in the past 6 months No    Has the patient had a decrease in activity level because of a fear of falling?  Yes   fatigue related   Is the patient reluctant to leave their home because of a fear of falling?   No      Home Ecologist residence    Living Arrangements Spouse/significant other      Prior Function   Level of Independence Independent    Leisure exercises - not doing currently      Cognition   Overall Cognitive Status Within Functional Limits for tasks assessed      Posture/Postural Control   Posture/Postural Control Postural limitations    Postural Limitations Weight shift left;Rounded Shoulders      ROM / Strength   AROM / PROM / Strength AROM;Strength      AROM   Overall AROM Comments shoulder abd Lt 136 deg; Rt 156 deg      Strength   Overall Strength Comments UE and LE grossly 4/5; demos breath holding with core activity      Flexibility   Soft Tissue Assessment /Muscle Length yes    Hamstrings 80%      Palpation   Palpation comment upper trap, pecs, fascial restriction and decreased rib mob      Ambulation/Gait   Gait Pattern Within Functional Limits                      Objective measurements completed on examination: See above findings.     Pelvic Floor Special Questions - 11/03/20 0001    Prior Pelvic/Prostate Exam Yes    Number of Pregnancies 2   1 miscarriage   Number of Vaginal Deliveries 1    Any difficulty with labor and deliveries --   carried on the bladder   Currently Sexually Active Yes    Is this Painful No    Urinary Leakage Yes    How often a couple times    Activities that cause leaking With strong urge    Urinary urgency No    Urinary frequency IBS and will have frequency of BM; bladder has a hard time emptying at least 1x/day    Exam Type Deferred                    PT Education - 11/03/20 1836    Education Details info for sleeping; hydration; Access Code: [email or web address]    Person(s) Educated Patient    Methods Explanation;Demonstration;Tactile cues;Verbal cues;Handout    Comprehension Verbalized understanding;Returned demonstration            PT Short Term Goals - 11/03/20  1815      PT SHORT TERM GOAL #1   Title independent with initial HEP    Time 4    Period Weeks    Status  New      PT SHORT TERM GOAL #2   Title pain with daily activities is moderate due to improved awareness of posture    Time 4    Period Weeks    Status New             PT Long Term Goals - 11/03/20 1809      PT LONG TERM GOAL #1   Title Independent with HEP    Time 8    Period Weeks    Status New    Target Date 12/20/20      PT LONG TERM GOAL #2   Title Pt will demonstrate improved UE ROM of Lt shoulder abduction at least 145 deg for improved overhead activities    Time 8    Period Weeks    Status New    Target Date 12/20/20      PT LONG TERM GOAL #3   Title Pt will demonstrate Rt and Lt UE flexion and abduction strength of 5/5 for improved overhead function    Time 8    Period Weeks    Status New    Target Date 12/20/20      PT LONG TERM GOAL #4   Title Pt will report no difficulty emptying for voiding or to have a BM due to improved soft tissue length and coordination of pelvic floor.    Time 8    Period Weeks    Status New    Target Date 12/20/20      PT LONG TERM GOAL #5   Title Pt will she is able to resume intimacy with her husband due to understanding and implementing self care techniques for her pelvic floor health    Time 8    Period Weeks    Status New    Target Date 12/20/20                  Plan - 11/03/20 1816    Clinical Impression Statement Pt presents to clinic due to recently finishing her treatments for estrogen receptor positive breast cancer. Pt ultimately would like to get back to a more healthy and active lifestyle but has had trouble due to fatigue and symtpoms related to treatment.  Pt has reduce shoulder ROM and strength Lt vs Rt side.  Pt has weakness generally throughout the LE.  Pt reports she has not felt like being intimate with her spouse and has had increased urinary urgency that may be partially due to some pelvic  floor related symptoms that accompany lower estrogen levels.  Pt has posture impairments and core weaknesses as mentioned above.  Pt has diminished thoracic and lumbar mobility and tension in upper thoracic and ribcage regions.  Pt will benefit from skilled PT to address impairments so she can regain strength and activity levels as part of her healthy lifestyle.    Personal Factors and Comorbidities Age;Comorbidity 2    Comorbidities estrogen pos breast CA; 2 vaginal deliveries    Examination-Activity Limitations Toileting;Reach Overhead    Examination-Participation Restrictions Interpersonal Relationship    Stability/Clinical Decision Making Evolving/Moderate complexity    Clinical Decision Making Moderate    Rehab Potential Excellent    PT Frequency 2x / week    PT Duration 8 weeks    PT Treatment/Interventions ADLs/Self Care Home Management;Biofeedback;Aquatic Therapy;Cryotherapy;Dentist;Therapeutic activities;Therapeutic exercise;Neuromuscular re-education;Patient/family education;Manual techniques;Passive range of motion;Dry needling;Taping    PT Next Visit Plan start on nustep; urge techniques; toilet techniques; intitial  strength, shoulder ROM, breathing    Consulted and Agree with Plan of Care Patient           Patient will benefit from skilled therapeutic intervention in order to improve the following deficits and impairments:  Pain,Postural dysfunction,Impaired flexibility,Increased fascial restricitons,Decreased coordination,Decreased range of motion,Impaired perceived functional ability,Increased muscle spasms,Difficulty walking  Visit Diagnosis: Muscle weakness (generalized)  Stiffness of left shoulder, not elsewhere classified  Cramp and spasm     Problem List Patient Active Problem List   Diagnosis Date Noted  . History of adenomatous polyp of colon 08/14/2020  . Family history of breast cancer   . Cancer of  central portion of left female breast ([place]) 09/18/2019  . Prediabetes 04/06/2017  . Osteoporosis 06/12/2015  . IBS (irritable bowel syndrome) 08/21/2014  . Hyperlipidemia 08/05/2011  . ABDOMINAL PAIN RIGHT UPPER QUADRANT 10/30/2008  . ABDOMINAL BLOATING 10/25/2008  . GERD 06/04/2008  . DIVERTICULOSIS OF COLON 06/04/2008    Jule Ser, PT 11/03/2020, 6:55 PM  [facility] [address], [place], [place], [address] Phone: [phone]   Fax:  [phone]  Name: Shelly [name] MRN: [id number] Date of Birth: [birth year]

## 2020-11-03 NOTE — Addendum Note (Signed)
Addended by: Su Hoff on: 11/03/2020 06:58 PM   Modules accepted: Orders

## 2020-11-05 ENCOUNTER — Other Ambulatory Visit: Payer: Self-pay

## 2020-11-05 ENCOUNTER — Ambulatory Visit: Payer: Medicare Other | Admitting: Physical Therapy

## 2020-11-05 DIAGNOSIS — R252 Cramp and spasm: Secondary | ICD-10-CM

## 2020-11-05 DIAGNOSIS — M25612 Stiffness of left shoulder, not elsewhere classified: Secondary | ICD-10-CM | POA: Diagnosis not present

## 2020-11-05 DIAGNOSIS — M6281 Muscle weakness (generalized): Secondary | ICD-10-CM

## 2020-11-05 NOTE — Therapy (Signed)
[facility] [address], [place], [place], [address] Phone: [phone]   Fax:  [phone]  Physical Therapy Treatment  Patient Details  Name: Shelly [name] MRN: [id number] Date of Birth: 1945-12-04 Referring Provider (PT): Truitt Merle, MD   Encounter Date: 11/05/2020   PT End of Session - 11/05/20 1238    Visit Number 2    Date for PT Re-Evaluation 12/20/20    Authorization Type medicare    PT Start Time 1147    PT Stop Time 1228    PT Time Calculation (min) 41 min    Activity Tolerance Patient tolerated treatment well    Behavior During Therapy [facility] for tasks assessed/performed           Past Medical History:  Diagnosis Date  . Arthritis    wrist, shoulder   . Cancer ([place]) 08/2019   left breast IDC  . Diverticulitis   . Endometrial polyp   . Family history of breast cancer   . GERD (gastroesophageal reflux disease)    occasional - diet controlled  . Hx of adenomatous colonic polyps   . Hyperlipidemia   . IBS (irritable bowel syndrome)   . Missed abortion    x 1 - resolved - no surgery  . Osteoporosis   . SVD (spontaneous vaginal delivery)    x 1  . Transient vision disturbance 2019   was evaluated by ED, no findings  . Vitamin D deficiency     Past Surgical History:  Procedure Laterality Date  . BREAST EXCISIONAL BIOPSY Right 08/2019   Benign, lesion   . BREAST LUMPECTOMY Left 09/2019  . BREAST LUMPECTOMY WITH RADIOACTIVE SEED AND SENTINEL LYMPH NODE BIOPSY Left 10/06/2019   Procedure: LEFT BREAST LUMPECTOMY  X2 WITH RADIOACTIVE SEED X2  AND SENTINEL LYMPH NODE BIOPSY;  Surgeon: Jovita Kussmaul, MD;  Location: [facility];  Service: General;  Laterality: Left;  . BREAST LUMPECTOMY WITH RADIOACTIVE SEED LOCALIZATION Right 10/06/2019   Procedure: RIGHT BREAST LUMPECTOMY WITH RADIOACTIVE SEED LOCALIZATION;  Surgeon: Jovita Kussmaul, MD;  Location: [facility];   Service: General;  Laterality: Right;  . COLONOSCOPY    . DIAGNOSTIC LAPAROSCOPY     cysts  . DILATATION & CURETTAGE/HYSTEROSCOPY WITH TRUECLEAR N/A 10/31/2013   Procedure: DILATATION & CURETTAGE/HYSTEROSCOPY WITH TRUCLEAR;  Surgeon: Marylynn Pearson, MD;  Location: [facility];  Service: Gynecology;  Laterality: N/A;  . DILATION AND CURETTAGE OF UTERUS    . MASS EXCISION Left 11/06/2019   Procedure: EXCISION NIPPLE AND AREOLA  MARGIN LEFT BREAST;  Surgeon: Jovita Kussmaul, MD;  Location: [facility];  Service: General;  Laterality: Left;  . TONSILLECTOMY  1952  . WISDOM TOOTH EXTRACTION      There were no vitals filed for this visit.   Subjective Assessment - 11/05/20 1238    Subjective I was feeling sore after some of the exercises but overall I think they are helping.  I might have pushed it too much    Patient Stated Goals get energy, walk on the TM, exercise routine that won't hurt me    Currently in Pain? No/denies                             [facility] Adult PT Treatment/Exercise - 11/05/20 0001      Self-Care   Self-Care Other Self-Care Comments    Other Self-Care Comments  urge  techniques      Exercises   Exercises Shoulder      Shoulder Exercises: Standing   Protraction Strengthening    Other Standing Exercises shoulder ext yellow - 20x with cues for posture    Other Standing Exercises upper trap stretch with cues for correct positons; pec stretch alternating sides instead of both simultaneous      Manual Therapy   Manual Therapy Myofascial release    Myofascial Release respiirator diaphragm, shoulder girdle; cranial C1 and occipital bones - all with craniosacral release techniques                  PT Education - 11/05/20 1233    Education Details Access Code: [email or web address]    Person(s) Educated Patient    Methods Explanation;Demonstration;Tactile cues;Verbal cues;Handout    Comprehension Verbalized understanding;Returned demonstration             PT Short Term Goals - 11/03/20 1815      PT SHORT TERM GOAL #1   Title independent with initial HEP    Time 4    Period Weeks    Status New      PT SHORT TERM GOAL #2   Title pain with daily activities is moderate due to improved awareness of posture    Time 4    Period Weeks    Status New             PT Long Term Goals - 11/03/20 1809      PT LONG TERM GOAL #1   Title Independent with HEP    Time 8    Period Weeks    Status New    Target Date 12/20/20      PT LONG TERM GOAL #2   Title Pt will demonstrate improved UE ROM of Lt shoulder abduction at least 145 deg for improved overhead activities    Time 8    Period Weeks    Status New    Target Date 12/20/20      PT LONG TERM GOAL #3   Title Pt will demonstrate Rt and Lt UE flexion and abduction strength of 5/5 for improved overhead function    Time 8    Period Weeks    Status New    Target Date 12/20/20      PT LONG TERM GOAL #4   Title Pt will report no difficulty emptying for voiding or to have a BM due to improved soft tissue length and coordination of pelvic floor.    Time 8    Period Weeks    Status New    Target Date 12/20/20      PT LONG TERM GOAL #5   Title Pt will she is able to resume intimacy with her husband due to understanding and implementing self care techniques for her pelvic floor health    Time 8    Period Weeks    Status New    Target Date 12/20/20                 Plan - 11/05/20 1228    Clinical Impression Statement Pt responded well to treatment today.  She reports she felt improved ability to turn her head and it "felt lighter".  Pt did well with shoulder extension and pec stretch. Pt has tension throughout pecs and thoracic region that limits shoulder movement.  She also has fascial restrictions from previous surgery.  Pt will continue to benefit from skilled PT to work on strength  and ROM. Pt was given urge techniques today    PT Treatment/Interventions  ADLs/Self Care Home Management;Biofeedback;Aquatic Therapy;Cryotherapy;Dentist;Therapeutic activities;Therapeutic exercise;Neuromuscular re-education;Patient/family education;Manual techniques;Passive range of motion;Dry needling;Taping    PT Next Visit Plan start on nustep; give her yellow band and exercises left from last visit f/u urge techniques; toilet techniques; progress strength, shoulder ROM, breathing    PT Home Exercise Plan Access Code: [email or web address]    Consulted and Agree with Plan of Care Patient           Patient will benefit from skilled therapeutic intervention in order to improve the following deficits and impairments:  Pain,Postural dysfunction,Impaired flexibility,Increased fascial restricitons,Decreased coordination,Decreased range of motion,Impaired perceived functional ability,Increased muscle spasms,Difficulty walking  Visit Diagnosis: Muscle weakness (generalized)  Stiffness of left shoulder, not elsewhere classified  Cramp and spasm     Problem List Patient Active Problem List   Diagnosis Date Noted  . History of adenomatous polyp of colon 08/14/2020  . Family history of breast cancer   . Cancer of central portion of left female breast ([place]) 09/18/2019  . Prediabetes 04/06/2017  . Osteoporosis 06/12/2015  . IBS (irritable bowel syndrome) 08/21/2014  . Hyperlipidemia 08/05/2011  . ABDOMINAL PAIN RIGHT UPPER QUADRANT 10/30/2008  . ABDOMINAL BLOATING 10/25/2008  . GERD 06/04/2008  . DIVERTICULOSIS OF COLON 06/04/2008    Jule Ser, PT 11/05/2020, 12:39 PM  [facility] [address], [place], [place], [address] Phone: [phone]   Fax:  [phone]  Name: Jazelyn Rumbold MRN: [id number] Date of Birth: [birth year]

## 2020-11-05 NOTE — Patient Instructions (Signed)
Access Code: [email or web address] URL: [email or web address] Date: 11/05/2020 Prepared by: Jari Favre  Exercises Seated Thoracic Lumbar Extension with Pectoralis Stretch - 1 x daily - 7 x weekly - 1 sets - 10 reps - 5 hold Seated Gentle Upper Trapezius Stretch - 1 x daily - 7 x weekly - 1 sets - 3 reps - 30 sec hold Seated Diaphragmatic Breathing - 3 x daily - 7 x weekly - 1 sets - 10 reps Reclined Diaphragmatic Breathing - 1 x daily - 7 x weekly - 3 sets - 10 reps Single Arm Doorway Pec Stretch at 90 Degrees Abduction - 1 x daily - 7 x weekly - 3 sets - 10 reps Shoulder extension with resistance - Neutral - 1 x daily - 7 x weekly - 3 sets - 10 reps

## 2020-11-07 ENCOUNTER — Telehealth: Payer: Self-pay

## 2020-11-07 NOTE — Telephone Encounter (Signed)
Ms Schmall called stating she had to stop the antidepressant secondary to IBS.

## 2020-11-11 ENCOUNTER — Encounter: Payer: Self-pay | Admitting: Physical Therapy

## 2020-11-11 ENCOUNTER — Other Ambulatory Visit: Payer: Self-pay

## 2020-11-11 ENCOUNTER — Ambulatory Visit: Payer: Medicare Other | Admitting: Physical Therapy

## 2020-11-11 DIAGNOSIS — M6281 Muscle weakness (generalized): Secondary | ICD-10-CM

## 2020-11-11 DIAGNOSIS — M25612 Stiffness of left shoulder, not elsewhere classified: Secondary | ICD-10-CM

## 2020-11-11 DIAGNOSIS — R252 Cramp and spasm: Secondary | ICD-10-CM | POA: Diagnosis not present

## 2020-11-11 NOTE — Therapy (Addendum)
[facility] [address] [place], [place], [address] Phone: [phone]   Fax:  [phone]  Physical Therapy Treatment  Patient Details  Name: Cherysh Epperly MRN: [id number] Date of Birth: [birth year] Referring Provider (PT): Truitt Merle, MD   Encounter Date: 11/11/2020   PT End of Session - 11/11/20 1720    Visit Number 3    Date for PT Re-Evaluation 12/20/20    Authorization Type medicare    PT Start Time 1150    PT Stop Time 1230    PT Time Calculation (min) 40 min    Activity Tolerance Patient tolerated treatment well    Behavior During Therapy [facility] for tasks assessed/performed           Past Medical History:  Diagnosis Date  . Arthritis    wrist, shoulder   . Cancer ([place]) 08/2019   left breast IDC  . Diverticulitis   . Endometrial polyp   . Family history of breast cancer   . GERD (gastroesophageal reflux disease)    occasional - diet controlled  . Hx of adenomatous colonic polyps   . Hyperlipidemia   . IBS (irritable bowel syndrome)   . Missed abortion    x 1 - resolved - no surgery  . Osteoporosis   . SVD (spontaneous vaginal delivery)    x 1  . Transient vision disturbance 2019   was evaluated by ED, no findings  . Vitamin D deficiency     Past Surgical History:  Procedure Laterality Date  . BREAST EXCISIONAL BIOPSY Right 08/2019   Benign, lesion   . BREAST LUMPECTOMY Left 09/2019  . BREAST LUMPECTOMY WITH RADIOACTIVE SEED AND SENTINEL LYMPH NODE BIOPSY Left 10/06/2019   Procedure: LEFT BREAST LUMPECTOMY  X2 WITH RADIOACTIVE SEED X2  AND SENTINEL LYMPH NODE BIOPSY;  Surgeon: Jovita Kussmaul, MD;  Location: [place];  Service: General;  Laterality: Left;  . BREAST LUMPECTOMY WITH RADIOACTIVE SEED LOCALIZATION Right 10/06/2019   Procedure: RIGHT BREAST LUMPECTOMY WITH RADIOACTIVE SEED LOCALIZATION;  Surgeon: Jovita Kussmaul, MD;  Location: [place];   Service: General;  Laterality: Right;  . COLONOSCOPY    . DIAGNOSTIC LAPAROSCOPY     cysts  . DILATATION & CURETTAGE/HYSTEROSCOPY WITH TRUECLEAR N/A 10/31/2013   Procedure: DILATATION & CURETTAGE/HYSTEROSCOPY WITH TRUCLEAR;  Surgeon: Marylynn Pearson, MD;  Location: [facility];  Service: Gynecology;  Laterality: N/A;  . DILATION AND CURETTAGE OF UTERUS    . MASS EXCISION Left 11/06/2019   Procedure: EXCISION NIPPLE AND AREOLA  MARGIN LEFT BREAST;  Surgeon: Jovita Kussmaul, MD;  Location: [place];  Service: General;  Laterality: Left;  . TONSILLECTOMY  1952  . WISDOM TOOTH EXTRACTION      There were no vitals filed for this visit.   Subjective Assessment - 11/11/20 1201    Subjective Pt states she is very tired today.    Patient Stated Goals get energy, walk on the TM, exercise routine that won't hurt me                             [facility] Adult PT Treatment/Exercise - 11/11/20 0001      Self-Care   Other Self-Care Comments  reviewed urge techniques that she doesn't have to lie down to do these      Shoulder Exercises: Standing   Extension Strengthening;Both;20 reps;Theraband    Theraband Level (Shoulder  Extension) Level 1 (Yellow)      Shoulder Exercises: ROM/Strengthening   Nustep L1 x 8 min - PT present for status update    Other ROM/Strengthening Exercises AAROM ranger flexion and abduction    Other ROM/Strengthening Exercises AAROM shoulder abduction with cane      Manual Therapy   Manual Therapy Soft tissue mobilization    Soft tissue mobilization thoracic paraspinals                    PT Short Term Goals - 11/11/20 1731      PT SHORT TERM GOAL #1   Title independent with initial HEP    Status Achieved      PT SHORT TERM GOAL #2   Title pain with daily activities is moderate due to improved awareness of posture    Status On-going             PT Long Term Goals - 11/03/20 1809      PT LONG TERM GOAL #1   Title  Independent with HEP    Time 8    Period Weeks    Status New    Target Date 12/20/20      PT LONG TERM GOAL #2   Title Pt will demonstrate improved UE ROM of Lt shoulder abduction at least 145 deg for improved overhead activities    Time 8    Period Weeks    Status New    Target Date 12/20/20      PT LONG TERM GOAL #3   Title Pt will demonstrate Rt and Lt UE flexion and abduction strength of 5/5 for improved overhead function    Time 8    Period Weeks    Status New    Target Date 12/20/20      PT LONG TERM GOAL #4   Title Pt will report no difficulty emptying for voiding or to have a BM due to improved soft tissue length and coordination of pelvic floor.    Time 8    Period Weeks    Status New    Target Date 12/20/20      PT LONG TERM GOAL #5   Title Pt will she is able to resume intimacy with her husband due to understanding and implementing self care techniques for her pelvic floor health    Time 8    Period Weeks    Status New    Target Date 12/20/20                 Plan - 11/11/20 1720    Clinical Impression Statement Today's session focused on A/AROM shoulder flexionand abduction.  Pt was reported some fatigue but no pain . Pt reviewed shoulder ext from HEP and was able to do correctly.  PT addressing posture deficits and addressed stiffness in thoracic paraspinals with STM to thoracic paraspinals. Pt is very TTP with lower left and upper Rt thoracic regions having the most tension.  Pt got improved muscle relase and mobility after treatment.  Pt will benefit from skilled PT to continue to address general strength, shoulder ROM and posture.    PT Treatment/Interventions ADLs/Self Care Home Management;Biofeedback;Aquatic Therapy;Cryotherapy;Dentist;Therapeutic activities;Therapeutic exercise;Neuromuscular re-education;Patient/family education;Manual techniques;Passive range of motion;Dry needling;Taping    PT  Next Visit Plan nustep; progress shoulder ROM, thoracic ROM; pec stretch and STM to pec; f/u on HEP and building strength/exercise routine    PT Home Exercise Plan Access Code: [email or web address]  Consulted and Agree with Plan of Care Patient           Patient will benefit from skilled therapeutic intervention in order to improve the following deficits and impairments:  Pain,Postural dysfunction,Impaired flexibility,Increased fascial restricitons,Decreased coordination,Decreased range of motion,Impaired perceived functional ability,Increased muscle spasms,Difficulty walking  Visit Diagnosis: Muscle weakness (generalized)  Stiffness of left shoulder, not elsewhere classified  Cramp and spasm     Problem List Patient Active Problem List   Diagnosis Date Noted  . History of adenomatous polyp of colon 08/14/2020  . Family history of breast cancer   . Cancer of central portion of left female breast ([place]) 09/18/2019  . Prediabetes 04/06/2017  . Osteoporosis 06/12/2015  . IBS (irritable bowel syndrome) 08/21/2014  . Hyperlipidemia 08/05/2011  . ABDOMINAL PAIN RIGHT UPPER QUADRANT 10/30/2008  . ABDOMINAL BLOATING 10/25/2008  . GERD 06/04/2008  . DIVERTICULOSIS OF COLON 06/04/2008    Jule Ser, PT 11/11/2020, 5:37 PM  [facility] [address], [place], [place], [address] Phone: [phone]   Fax:  [phone]  Name: Sonja Manseau MRN: [id number] Date of Birth: 07/24/1946  PHYSICAL THERAPY DISCHARGE SUMMARY  Visits from Start of Care: 3  Current functional level related to goals / functional outcomes: See above goals   Remaining deficits: See above   Education / Equipment: HEP  Plan: Patient agrees to discharge.  Patient goals were not met. Patient is being discharged due to the patient's request.  ?????    Wants to stay away from public as much as possible at the moment  [facility],  PT 11/28/20 9:57 AM

## 2020-11-20 ENCOUNTER — Encounter: Payer: Medicare Other | Admitting: Physical Therapy

## 2020-11-26 ENCOUNTER — Encounter: Payer: Medicare Other | Admitting: Physical Therapy

## 2020-11-28 ENCOUNTER — Encounter: Payer: Medicare Other | Admitting: Physical Therapy

## 2020-12-02 ENCOUNTER — Encounter: Payer: Medicare Other | Admitting: Physical Therapy

## 2020-12-04 ENCOUNTER — Inpatient Hospital Stay: Payer: Medicare Other | Attending: Hematology

## 2020-12-04 ENCOUNTER — Inpatient Hospital Stay (HOSPITAL_BASED_OUTPATIENT_CLINIC_OR_DEPARTMENT_OTHER): Payer: Medicare Other | Admitting: Hematology

## 2020-12-04 ENCOUNTER — Encounter (HOSPITAL_COMMUNITY): Payer: Self-pay

## 2020-12-04 ENCOUNTER — Encounter: Payer: Self-pay | Admitting: Hematology

## 2020-12-04 ENCOUNTER — Other Ambulatory Visit: Payer: Self-pay

## 2020-12-04 ENCOUNTER — Ambulatory Visit (HOSPITAL_COMMUNITY)
Admission: RE | Admit: 2020-12-04 | Discharge: 2020-12-04 | Disposition: A | Discharge status: Home / Self Care | Payer: Medicare Other | Source: Ambulatory Visit | Attending: Hematology | Admitting: Hematology

## 2020-12-04 DIAGNOSIS — Z170 Estrogen receptor positive status [ER+]: Secondary | ICD-10-CM

## 2020-12-04 DIAGNOSIS — J984 Other disorders of lung: Secondary | ICD-10-CM | POA: Diagnosis not present

## 2020-12-04 DIAGNOSIS — R911 Solitary pulmonary nodule: Secondary | ICD-10-CM

## 2020-12-04 DIAGNOSIS — I2510 Atherosclerotic heart disease of native coronary artery without angina pectoris: Secondary | ICD-10-CM | POA: Diagnosis not present

## 2020-12-04 DIAGNOSIS — C50112 Malignant neoplasm of central portion of left female breast: Secondary | ICD-10-CM | POA: Diagnosis not present

## 2020-12-04 DIAGNOSIS — S22080A Wedge compression fracture of T11-T12 vertebra, initial encounter for closed fracture: Secondary | ICD-10-CM | POA: Diagnosis not present

## 2020-12-04 DIAGNOSIS — J181 Lobar pneumonia, unspecified organism: Secondary | ICD-10-CM | POA: Diagnosis not present

## 2020-12-04 LAB — CBC WITH DIFFERENTIAL (CANCER CENTER ONLY)
Abs Immature Granulocytes: 0.03 10*3/uL (ref 0.00–0.07)
Basophils Absolute: 0 10*3/uL (ref 0.0–0.1)
Basophils Relative: 0 %
Eosinophils Absolute: 0.2 10*3/uL (ref 0.0–0.5)
Eosinophils Relative: 2 %
HCT: 36.8 % (ref 36.0–46.0)
Hemoglobin: 11.5 g/dL — ABNORMAL LOW (ref 12.0–15.0)
Immature Granulocytes: 0 %
Lymphocytes Relative: 16 %
Lymphs Abs: 1.3 10*3/uL (ref 0.7–4.0)
MCH: 26.6 pg (ref 26.0–34.0)
MCHC: 31.3 g/dL (ref 30.0–36.0)
MCV: 85.2 fL (ref 80.0–100.0)
Monocytes Absolute: 0.7 10*3/uL (ref 0.1–1.0)
Monocytes Relative: 9 %
Neutro Abs: 6.2 10*3/uL (ref 1.7–7.7)
Neutrophils Relative %: 73 %
Platelet Count: 445 10*3/uL — ABNORMAL HIGH (ref 150–400)
RBC: 4.32 MIL/uL (ref 3.87–5.11)
RDW: 13.8 % (ref 11.5–15.5)
WBC Count: 8.5 10*3/uL (ref 4.0–10.5)
nRBC: 0 % (ref 0.0–0.2)

## 2020-12-04 LAB — CMP (CANCER CENTER ONLY)
ALT: 10 U/L (ref 0–44)
AST: 10 U/L — ABNORMAL LOW (ref 15–41)
Albumin: 3 g/dL — ABNORMAL LOW (ref 3.5–5.0)
Alkaline Phosphatase: 75 U/L (ref 38–126)
Anion gap: 9 (ref 5–15)
BUN: 16 mg/dL (ref 8–23)
CO2: 27 mmol/L (ref 22–32)
Calcium: 9.1 mg/dL (ref 8.9–10.3)
Chloride: 102 mmol/L (ref 98–111)
Creatinine: 0.88 mg/dL (ref 0.44–1.00)
GFR, Estimated: >60 mL/min (ref >60)
Glucose, Bld: 107 mg/dL — ABNORMAL HIGH (ref 70–99)
Potassium: 4.3 mmol/L (ref 3.5–5.1)
Sodium: 138 mmol/L (ref 135–145)
Total Bilirubin: 0.3 mg/dL (ref 0.3–1.2)
Total Protein: 7.5 g/dL (ref 6.5–8.1)

## 2020-12-04 NOTE — Progress Notes (Incomplete)
[place]   [phone] [phone]   Clinic Follow up Note   Patient Care Team: Eulas Post, MD as PCP - General (Family Medicine) Barbaraann Cao, [place] as Referring Physician (Optometry) Jovita Kussmaul, MD as Consulting Physician (General Surgery) Mauro Kaufmann, RN as Oncology Nurse Navigator Rockwell Germany, RN as Oncology Nurse Navigator Truitt Merle, MD as Consulting Physician (Hematology) Alla Feeling, NP as Nurse Practitioner (Nurse Practitioner) Eppie Gibson, MD as Attending Physician (Radiation Oncology)  Date of Service:  12/04/2020  CHIEF COMPLAINT: F/u of left breast cancer  SUMMARY OF ONCOLOGIC HISTORY: Oncology History Overview Note  Cancer Staging Cancer of central portion of left female breast [facility]) Staging form: Breast, AJCC 8th Edition - Clinical stage from 09/18/2019: Stage IA (cT1b, cN0, cM0, G2, ER+, PR+, HER2-) - Signed by Truitt Merle, MD on 09/18/2019 - Pathologic stage from 10/06/2019: Stage IA (pT1c, pN1a, cM0, G2, ER+, PR+, HER2-) - Signed by Truitt Merle, MD on 10/19/2019    Cancer of central portion of left female breast ([place])  08/24/2019 Mammogram   Diagnostic mammogram 08/24/19  IMPRESSION: 1. Left breast mass measuring 0.8 x 0.4 x 0.5 cm at the 1 o'clock retroareolar region is suspicious and likely corresponds to the area of distortion seen on mammography.   2. Left breast 3 o'clock retroareolar dilated duct with internal debris versus solid mass.   3. Right breast retroareolar focal asymmetry without sonographic correlate.   08/28/2019 Initial Biopsy   Final Diagnosis 08/28/19 1. Breast, left, needle core biopsy, 1 o'clock/retroareolar - INVASIVE DUCTAL CARCINOMA, GRADE II. - PERINEURAL INVASION. - SEE MICROSCOPIC DESCRIPTION. 2. Breast, left, needle core biopsy, 3 o'clock retroareolar - DUCTAL PAPILLOMA. - SEE MICROSCOPIC DESCRIPTION.   08/28/2019 Receptors her2   1. PROGNOSTIC INDICATORS  08/28/19 Results: IMMUNOHISTOCHEMICAL AND MORPHOMETRIC ANALYSIS PERFORMED MANUALLY The tumor cells are NEGATIVE for Her2 (0). Estrogen Receptor: 100%, POSITIVE, STRONG STAINING INTENSITY Progesterone Receptor: 50%, POSITIVE, STRONG STAINING INTENSITY Proliferation Marker Ki67: 12%   09/01/2019 Pathology Results   Diagnosis 09/01/19 Breast, right, needle core biopsy, retroareolar - COMPLEX SCLEROSING LESION WITH A PAPILLARY SCLEROSING LESION AND USUAL DUCTAL HYPERPLASIA. SEE NOTE - NEGATIVE FOR CARCINOMA   09/18/2019 Initial Diagnosis   Cancer of central portion of left female breast ([place])   09/18/2019 Cancer Staging   Staging form: Breast, AJCC 8th Edition - Clinical stage from 09/18/2019: Stage IA (cT1b, cN0, cM0, G2, ER+, PR+, HER2-) - Signed by Truitt Merle, MD on 09/18/2019   10/06/2019 Surgery   LEFT BREAST LUMPECTOMY  X2 WITH RADIOACTIVE SEED X2  AND SENTINEL LYMPH NODE BIOPSY and  RIGHT BREAST LUMPECTOMY WITH RADIOACTIVE SEED LOCALIZATION by Dr Marlou Starks  10/06/19    10/06/2019 Pathology Results   FINAL MICROSCOPIC DIAGNOSIS: 10/06/19   A. BREAST, RIGHT, LUMPECTOMY:  - Fibrocystic change with a small incidental intraductal papilloma and  usual ductal hyperplasia  - Biopsy site changes  - Negative for carcinoma   B. LYMPH NODE, LEFT #1, SENTINEL, BIOPSY:  - Metastatic carcinoma to a lymph node (1/1)  - Focus of metastatic carcinoma measures 2.5 mm and shows extranodal  extension   C. LYMPH NODE, LEFT #2, SENTINEL, BIOPSY:  - Lymph node, negative for carcinoma (0/1)   D. LYMPH NODE, LEFT #3, SENTINEL, BIOPSY:  - Lymph node, negative for carcinoma (0/1)   E. BREAST, LEFT, LUMPECTOMY:  - Invasive ductal carcinoma, grade 2, 1.8 cm  - Ductal carcinoma in situ, intermediate grade  - Invasive carcinoma  focally involves the anterior margin  - DCIS is less than 1 mm from the superior margin  - Background breast parenchyma with fibrocystic change, usual ductal  hyperplasia and  intraductal papilloma.  - Biopsy site changes  - See oncology table    10/06/2019 Miscellaneous   Her Mammaprint showed High Risk Luminal Type B, risk of recurrence in the next 10 years is 29% in high risk disease. MPI -0.335    10/06/2019 Cancer Staging   Staging form: Breast, AJCC 8th Edition - Pathologic stage from 10/06/2019: Stage IA (pT1c, pN1a, cM0, G2, ER+, PR+, HER2-) - Signed by Truitt Merle, MD on 10/19/2019   11/06/2019 Surgery   EXCISION NIPPLE AND AREOLA  MARGIN LEFT BREAST by Dr Marlou Starks  11/06/19    11/06/2019 Pathology Results    FINAL MICROSCOPIC DIAGNOSIS: 11/06/19  A. BREAST, LEFT CENTRAL, RE-EXCISION:  -  Residual ductal carcinoma in situ, intermediate grade  -  Margins uninvolved by carcinoma (0.4 cm; superior margin)  -  Atypical intraductal papilloma  -  Usual ductal hyperplasia  -  Benign skin with seborrheic keratosis  -  Previous surgical site changes  -  No invasive carcinoma identified    12/06/2019 - 12/20/2019 Chemotherapy   Adjuvant chemo TC q3weeks for 4 cycles starting 12/06/19. With first TC infusion she had allergy reaction to docetaxel and [name] was not given on 12/06/19, we discontinued.   -I started her on weekly Abraxane on 12/20/19, but 2 days after infusion she started to feel tachycardic, hot/flushed, and lightheaded. A few days later she had dysuria, diarrhea and abdominal cramps and intermittent chest pain. She opted to d/c chemo and proceed with RT.    01/25/2020 - 03/06/2020 Radiation Therapy   Adjuvant Radiation with Dr Isidore Moos 01/25/20-03/06/20   05/2020 -  Anti-estrogen oral therapy   Tamoxifen 20mg  daily starting in 03/2020 but held due to IBS flare, restarting in 05/2020    06/06/2020 Survivorship   Care plan delivered by Cira Rue, NP       CURRENT THERAPY:  Tamoxifen 20mg  daily starting in 03/2020-06/06/20 and 10mg  06/04/20-06/2020 and 2 weeks of Exemestane in 07/2020. Plan to restart in 09/2020 after starting Zoloft.   INTERVAL HISTORY:  *** Shelly [name] is here for a follow up. She presents to the clinic alone.    REVIEW OF SYSTEMS:  *** Constitutional: Denies fevers, chills or abnormal weight loss Eyes: Denies blurriness of vision Ears, nose, mouth, throat, and face: Denies mucositis or sore throat Respiratory: Denies cough, dyspnea or wheezes Cardiovascular: Denies palpitation, chest discomfort or lower extremity swelling Gastrointestinal:  Denies nausea, heartburn or change in bowel habits Skin: Denies abnormal skin rashes Lymphatics: Denies new lymphadenopathy or easy bruising Neurological:Denies numbness, tingling or new weaknesses Behavioral/Psych: Mood is stable, no new changes  All other systems were reviewed with the patient and are negative.  MEDICAL HISTORY:  Past Medical History:  Diagnosis Date  . Arthritis    wrist, shoulder   . Cancer ([place]) 08/2019   left breast IDC  . Diverticulitis   . Endometrial polyp   . Family history of breast cancer   . GERD (gastroesophageal reflux disease)    occasional - diet controlled  . Hx of adenomatous colonic polyps   . Hyperlipidemia   . IBS (irritable bowel syndrome)   . Missed abortion    x 1 - resolved - no surgery  . Osteoporosis   . SVD (spontaneous vaginal delivery)    x 1  . Transient  vision disturbance 2019   was evaluated by ED, no findings  . Vitamin D deficiency     SURGICAL HISTORY: Past Surgical History:  Procedure Laterality Date  . BREAST EXCISIONAL BIOPSY Right 08/2019   Benign, lesion   . BREAST LUMPECTOMY Left 09/2019  . BREAST LUMPECTOMY WITH RADIOACTIVE SEED AND SENTINEL LYMPH NODE BIOPSY Left 10/06/2019   Procedure: LEFT BREAST LUMPECTOMY  X2 WITH RADIOACTIVE SEED X2  AND SENTINEL LYMPH NODE BIOPSY;  Surgeon: Jovita Kussmaul, MD;  Location: [place];  Service: General;  Laterality: Left;  . BREAST LUMPECTOMY WITH RADIOACTIVE SEED LOCALIZATION Right 10/06/2019   Procedure: RIGHT BREAST LUMPECTOMY WITH  RADIOACTIVE SEED LOCALIZATION;  Surgeon: Jovita Kussmaul, MD;  Location: [place];  Service: General;  Laterality: Right;  . COLONOSCOPY    . DIAGNOSTIC LAPAROSCOPY     cysts  . DILATATION & CURETTAGE/HYSTEROSCOPY WITH TRUECLEAR N/A 10/31/2013   Procedure: DILATATION & CURETTAGE/HYSTEROSCOPY WITH TRUCLEAR;  Surgeon: Marylynn Pearson, MD;  Location: [facility];  Service: Gynecology;  Laterality: N/A;  . DILATION AND CURETTAGE OF UTERUS    . MASS EXCISION Left 11/06/2019   Procedure: EXCISION NIPPLE AND AREOLA  MARGIN LEFT BREAST;  Surgeon: Jovita Kussmaul, MD;  Location: [place];  Service: General;  Laterality: Left;  . TONSILLECTOMY  1952  . WISDOM TOOTH EXTRACTION      I have reviewed the social history and family history with the patient and they are unchanged from previous note.  ALLERGIES:  is allergic to taxotere [docetaxel], abraxane [paclitaxel protein-bound part], crestor [rosuvastatin calcium], lipitor [atorvastatin calcium], nsaids, pravastatin, and sulfonamide derivatives.  MEDICATIONS:  Current Outpatient Medications  Medication Sig Dispense Refill  . acetaminophen (TYLENOL) 500 MG tablet Take 500 mg by mouth every 6 (six) hours as needed for headache.    . denosumab (PROLIA) 60 MG/ML SOSY injection Inject 60 mg into the skin every 6 (six) months.    . esomeprazole (NEXIUM) 20 MG capsule Take 20 mg by mouth daily as needed (acid reflux).     Marland Kitchen loratadine (CLARITIN) 10 MG tablet Take 10 mg by mouth daily as needed for allergies.     Marland Kitchen PRESCRIPTION MEDICATION Apply 1 application topically at bedtime. Cream provided by hospital for breast blisters after radiation    . sertraline (ZOLOFT) 50 MG tablet TAKE 1 TABLET BY MOUTH EVERY DAY 90 tablet 1  . Thiamine HCl (VITAMIN B-1 PO) Take 1 tablet by mouth daily.    . Vitamin D, Ergocalciferol, (DRISDOL) 1.25 MG (50000 UNIT) CAPS capsule TAKE 1 CAPSULE (50,000 UNITS TOTAL) BY MOUTH EVERY 7 (SEVEN) DAYS. 12  capsule 1   No current facility-administered medications for this visit.    PHYSICAL EXAMINATION: ECOG PERFORMANCE STATUS: {CHL ONC ECOG [phone]}  There were no vitals filed for this visit. There were no vitals filed for this visit. *** GENERAL:alert, no distress and comfortable SKIN: skin color, texture, turgor are normal, no rashes or significant lesions EYES: normal, Conjunctiva are pink and non-injected, sclera clear {OROPHARYNX:no exudate, no erythema and lips, buccal mucosa, and tongue normal}  NECK: supple, thyroid normal size, non-tender, without nodularity LYMPH:  no palpable lymphadenopathy in the cervical, axillary {or inguinal} LUNGS: clear to auscultation and percussion with normal breathing effort HEART: regular rate & rhythm and no murmurs and no lower extremity edema ABDOMEN:abdomen soft, non-tender and normal bowel sounds Musculoskeletal:no cyanosis of digits and no clubbing  NEURO: alert & oriented x 3 with fluent speech,  no focal motor/sensory deficits  LABORATORY DATA:  I have reviewed the data as listed CBC Latest Ref Rng & Units 12/04/2020 09/03/2020 02/12/2020  WBC 4.0 - 10.5 K/uL 8.5 5.8 5.0  Hemoglobin 12.0 - 15.0 g/dL 11.5(L) 13.4 13.9  Hematocrit 36.0 - 46.0 % 36.8 40.8 42.9  Platelets 150 - 400 K/uL 445(H) 265 253     CMP Latest Ref Rng & Units 09/03/2020 08/14/2020 02/12/2020  Glucose 70 - 99 mg/dL 105(H) 106(H) 110(H)  BUN 8 - 23 mg/dL 17 18 19   Creatinine 0.44 - 1.00 mg/dL 0.84 0.92 0.83  Sodium 135 - 145 mmol/L 139 137 140  Potassium 3.5 - 5.1 mmol/L 4.1 4.2 4.2  Chloride 98 - 111 mmol/L 105 101 106  CO2 22 - 32 mmol/L 27 27 27   Calcium 8.9 - 10.3 mg/dL 9.6 9.0 8.8(L)  Total Protein 6.5 - 8.1 g/dL 6.9 - -  Total Bilirubin 0.3 - 1.2 mg/dL 0.3 - -  Alkaline Phos 38 - 126 U/L 73 - -  AST 15 - 41 U/L 14(L) - -  ALT 0 - 44 U/L 13 - -      RADIOGRAPHIC STUDIES: I have personally reviewed the radiological images as listed and agreed with  the findings in the report. No results found.   ASSESSMENT & PLAN:  Shelly [name] is a 75 y.o. female with    1.Cancer of central portion of left female breast, StageIA, c(T1bN0M0), ER/PR+, HER2-, GradeII, pT1cN1aM0, stage IA -She was diagnosed in 08/2019. She underwent B/l breast lumpectomy and SLNB with Dr. Marlou Starks on 10/06/19.Due to positive margins and 1/3 positive LNs she underwent re-excisional surgery on 11/06/19 which showed DCIS only, margins negative.She has high risk for recurrence based on Mammaprint. -She tried adjuvant chemo with TC and Abraxane but could not tolerate either. She did complete adjuvant RT  with Dr Isidore Moos 01/25/20-03/06/20. -She did not want to pursue Genetic testing  -I started her on antiestrogen therapy with Tamoxifen in 03/2020. She took until 06/06/20 and 60m 06/04/20-06/2020 but stopped due to exacerbated IBS symptoms. She also tried 2 weeks of Exemestane in 07/2020 but stopped due to increased Anxiety. I discussed given her high risk disease, I encouraged her to try again. I recommend starting SSRI with Zoloft before retrying Exemestane again. She is agreeable.  -She is otherwise clinically doing well. Labs reviewed from this week, CBC and CMP WNL except BG 105. Her 08/2020 Mammogram was benign.  -Continue surveillance. I will refer her to PT for left axillary and arm tightness. She is agreeable.  -F/u in 3 months    2. Osteoporosis, Vitamin D deficiency  -[place] prior to 2007 [place] Last DEXA in 06/2015 (-3.2 at [facility]).  -On Prolia injections every 6 months since 2017-2018. Was restarted on 01/30/20 in our clinic.  -Continue high dose Vit D and Tums (for calcium)   3. Diarrhea and Acid Reflux  -Her BM are controlled on Librax and Nexium and imodium. No recent Diarrhea   4. Anxiety,insomnia -She is nervous about her cancer diagnosis andchemo.  -She has had episodes of panic attack. She did have trouble sleeping, etiology likely  multifactorial.  -She is also on Ativan. She can use benadryl and Melatonin to help her sleep if needed. -She had increased anxiety on Exemestane. I recommend starting SSRI with Zoloft for 2 weeks. If tolerable will try restarting Exemestane. She is agreeable.    5. H/o smoking, Emphysema, abnormal CT chest in 01/2020 -She has h/o smoking.  -  On her 02/12/20 CT Angio chest she has b/l ground glass changes and moderate emphysema. There is no past chest scan to compare this -I personally reviewed and discussed her 09/03/20 CT chest which showed 2.2 x 1.8 cm area of mixed density in the left upper lobe with surrounding area of groundglass changes. This could be related to her previous left breast radiation or inflammatory changes, although malignancy is not developed but less likely.  She is asymptomatic, no clinical concern for infection. - I will consult with Dr Isidore Moos *** Her CT Chest from 12/04/20 showed ***   PLAN: -I called in Zoloft today  -Send PT referral  -Lab and F/u in 3 months with CT chest a few days before -She will contact PCP about her overdue second Shingles vaccine -will let Dr. Isidore Moos weight in about her CT scan findings    No problem-specific Assessment & Plan notes found for this encounter.   No orders of the defined types were placed in this encounter.  All questions were answered. The patient knows to call the clinic with any problems, questions or concerns. No barriers to learning was detected. The total time spent in the appointment was {CHL ONC TIME VISIT - TTSVX:[id number]}.     Joslyn Devon 12/04/2020   Oneal Deputy, am acting as scribe for Truitt Merle, MD.   {Add scribe attestation statement}

## 2020-12-04 NOTE — Progress Notes (Signed)
[facility]   Telephone:([phone]) [phone] Fax:([phone]   Clinic Follow up Note   Patient Care Team: Shelly Covey, MD as PCP - General (Family Medicine) Shelly [name], OD as Referring Physician (Optometry) Shelly Miner, MD as Consulting Physician (General Surgery) Shelly Proud, RN as Oncology Nurse Navigator Shelly Angelica, RN as Oncology Nurse Navigator Shelly Mood, MD as Consulting Physician (Hematology) Shelly Samples, NP as Nurse Practitioner (Nurse Practitioner) Shelly Peak, MD as Attending Physician (Radiation Oncology)   I connected with Shelly [name] on 12/04/2020 at  4:20 PM EST by telephone visit and verified that I am speaking with the correct person using two identifiers.  I discussed the limitations, risks, security and privacy concerns of performing an evaluation and management service by telephone and the availability of in person appointments. I also discussed with the patient that there may be a patient responsible charge related to this service. The patient expressed understanding and agreed to proceed.   Other persons participating in the visit and their role in the encounter:  Her husband  Patient's location:  Her Car Provider's location:  My Office   CHIEF COMPLAINT: F/u of left breast cancer and review CT chest   SUMMARY OF ONCOLOGIC HISTORY: Oncology History Overview Note  Cancer Staging Cancer of central portion of left female breast [facility]) Staging form: Breast, AJCC 8th Edition - Clinical stage from 09/18/2019: Stage IA (cT1b, cN0, cM0, G2, ER+, PR+, HER2-) - Signed by Shelly Mood, MD on 09/18/2019 - Pathologic stage from 10/06/2019: Stage IA (pT1c, pN1a, cM0, G2, ER+, PR+, HER2-) - Signed by Shelly Mood, MD on 10/19/2019    Cancer of central portion of left female breast (HCC)  08/24/2019 Mammogram   Diagnostic mammogram 08/24/19  IMPRESSION: 1. Left breast mass measuring 0.8 x 0.4 x 0.5 cm at the 1 o'clock  retroareolar region is suspicious and likely corresponds to the area of distortion seen on mammography.   2. Left breast 3 o'clock retroareolar dilated duct with internal debris versus solid mass.   3. Right breast retroareolar focal asymmetry without sonographic correlate.   08/28/2019 Initial Biopsy   Final Diagnosis 08/28/19 1. Breast, left, needle core biopsy, 1 o'clock/retroareolar - INVASIVE DUCTAL CARCINOMA, GRADE II. - PERINEURAL INVASION. - SEE MICROSCOPIC DESCRIPTION. 2. Breast, left, needle core biopsy, 3 o'clock retroareolar - DUCTAL PAPILLOMA. - SEE MICROSCOPIC DESCRIPTION.   08/28/2019 Receptors her2   1. PROGNOSTIC INDICATORS 08/28/19 Results: IMMUNOHISTOCHEMICAL AND MORPHOMETRIC ANALYSIS PERFORMED MANUALLY The tumor cells are NEGATIVE for Her2 (0). Estrogen Receptor: 100%, POSITIVE, STRONG STAINING INTENSITY Progesterone Receptor: 50%, POSITIVE, STRONG STAINING INTENSITY Proliferation Marker Ki67: 12%   09/01/2019 Pathology Results   Diagnosis 09/01/19 Breast, right, needle core biopsy, retroareolar - COMPLEX SCLEROSING LESION WITH A PAPILLARY SCLEROSING LESION AND USUAL DUCTAL HYPERPLASIA. SEE NOTE - NEGATIVE FOR CARCINOMA   09/18/2019 Initial Diagnosis   Cancer of central portion of left female breast (HCC)   09/18/2019 Cancer Staging   Staging form: Breast, AJCC 8th Edition - Clinical stage from 09/18/2019: Stage IA (cT1b, cN0, cM0, G2, ER+, PR+, HER2-) - Signed by Shelly Mood, MD on 09/18/2019   10/06/2019 Surgery   LEFT BREAST LUMPECTOMY  X2 WITH RADIOACTIVE SEED X2  AND SENTINEL LYMPH NODE BIOPSY and  RIGHT BREAST LUMPECTOMY WITH RADIOACTIVE SEED LOCALIZATION by Dr Shelly [name]  10/06/19    10/06/2019 Pathology Results   FINAL MICROSCOPIC DIAGNOSIS: 10/06/19   A. BREAST, RIGHT, LUMPECTOMY:  - Fibrocystic change with a small  incidental intraductal papilloma and  usual ductal hyperplasia  - Biopsy site changes  - Negative for carcinoma   B. LYMPH NODE,  LEFT #1, SENTINEL, BIOPSY:  - Metastatic carcinoma to a lymph node (1/1)  - Focus of metastatic carcinoma measures 2.5 mm and shows extranodal  extension   C. LYMPH NODE, LEFT #2, SENTINEL, BIOPSY:  - Lymph node, negative for carcinoma (0/1)   D. LYMPH NODE, LEFT #3, SENTINEL, BIOPSY:  - Lymph node, negative for carcinoma (0/1)   E. BREAST, LEFT, LUMPECTOMY:  - Invasive ductal carcinoma, grade 2, 1.8 cm  - Ductal carcinoma in situ, intermediate grade  - Invasive carcinoma focally involves the anterior margin  - DCIS is less than 1 mm from the superior margin  - Background breast parenchyma with fibrocystic change, usual ductal  hyperplasia and intraductal papilloma.  - Biopsy site changes  - See oncology table    10/06/2019 Miscellaneous   Her Mammaprint showed High Risk Luminal Type B, risk of recurrence in the next 10 years is 29% in high risk disease. MPI -0.335    10/06/2019 Cancer Staging   Staging form: Breast, AJCC 8th Edition - Pathologic stage from 10/06/2019: Stage IA (pT1c, pN1a, cM0, G2, ER+, PR+, HER2-) - Signed by Shelly Merle, MD on 10/19/2019   11/06/2019 Surgery   EXCISION NIPPLE AND AREOLA  MARGIN LEFT BREAST by Dr Shelly [name]  11/06/19    11/06/2019 Pathology Results    FINAL MICROSCOPIC DIAGNOSIS: 11/06/19  A. BREAST, LEFT CENTRAL, RE-EXCISION:  -  Residual ductal carcinoma in situ, intermediate grade  -  Margins uninvolved by carcinoma (0.4 cm; superior margin)  -  Atypical intraductal papilloma  -  Usual ductal hyperplasia  -  Benign skin with seborrheic keratosis  -  Previous surgical site changes  -  No invasive carcinoma identified    12/06/2019 - 12/20/2019 Chemotherapy   Adjuvant chemo TC q3weeks for 4 cycles starting 12/06/19. With first TC infusion she had allergy reaction to docetaxel and Shelly [name] was not given on 12/06/19, we discontinued.   -I started her on weekly Abraxane on 12/20/19, but 2 days after infusion she started to feel tachycardic, hot/flushed,  and lightheaded. A few days later she had dysuria, diarrhea and abdominal cramps and intermittent chest pain. She opted to d/c chemo and proceed with RT.    01/25/2020 - 03/06/2020 Radiation Therapy   Adjuvant Radiation with Dr Isidore Moos 01/25/20-03/06/20   05/2020 -  Anti-estrogen oral therapy   Tamoxifen 20mg  daily starting in 03/2020 but held due to IBS flare, restarting in 05/2020    06/06/2020 Survivorship   Care plan delivered by Cira Rue, NP    12/04/2020 Imaging   CT chest  IMPRESSION: 1. Marked interval worsening of parenchymal disease now involving areas of the LEFT lower lobe, lingula, RIGHT upper lobe and with nodular opacity in the RIGHT lower lobe. Findings could be seen in the setting of COVID-19 infection. Pattern of disease also raises the question of organizing pneumonia. Pneumonitis, drug related changes are also considered. Pattern of disease would be atypical for neoplasm. Pulmonary consultation is suggested if not yet performed. Continued short interval surveillance is also suggested. 2. Post LEFT breast lumpectomy and axillary dissection. 3. Calcified coronary artery disease in LEFT and RIGHT coronary circulation. 4. Stable LEFT adrenal nodule compatible with benign adenoma. 5. Aortic atherosclerosis.   Aortic Atherosclerosis (ICD10-I70.0).      CURRENT THERAPY:  Tamoxifen 20mg  daily starting in 03/2020-06/06/20 and 10mg  06/04/20-06/2020 and 2 weeks of  Exemestane in 07/2020.   INTERVAL HISTORY:  Shelly [name] is here for a follow up of left breast cancer. She notes having nasal drainage which goes down her throat and leads to coughing. She notes she has had this all her life and not worse than usual. The weather does effect this. She denies any additional new symptoms like SOB. She notes she is eating fairly well although her appetite fluctuates.   She notes she had her COVID vaccine and booster. She notes she was exposed to COVID 6 weeks ago from son-in-law.  She notes after that she felt bad and took 2 weeks to recover after. She notes COVID tested negative around that time.  She notes she feels depressed about her health not being maintained. She has concern about her health declining moving forward.   REVIEW OF SYSTEMS:   Constitutional: Denies fevers, chills or abnormal weight loss Eyes: Denies blurriness of vision Ears, nose, mouth, throat, and face: Denies mucositis or sore throat Respiratory: Denies cough, dyspnea or wheezes Cardiovascular: Denies palpitation, chest discomfort or lower extremity swelling Gastrointestinal:  Denies nausea, heartburn or change in bowel habits Skin: Denies abnormal skin rashes Lymphatics: Denies new lymphadenopathy or easy bruising Neurological:Denies numbness, tingling or new weaknesses Behavioral/Psych: [name] is stable, no new changes  All other systems were reviewed with the patient and are negative.  MEDICAL HISTORY:  Past Medical History:  Diagnosis Date  . Arthritis    wrist, shoulder   . Cancer (HCC) 08/2019   left breast IDC  . Diverticulitis   . Endometrial polyp   . Family history of breast cancer   . GERD (gastroesophageal reflux disease)    occasional - diet controlled  . Hx of adenomatous colonic polyps   . Hyperlipidemia   . IBS (irritable bowel syndrome)   . Missed abortion    x 1 - resolved - no surgery  . Osteoporosis   . SVD (spontaneous vaginal delivery)    x 1  . Transient vision disturbance 2019   was evaluated by ED, no findings  . Vitamin D deficiency     SURGICAL HISTORY: Past Surgical History:  Procedure Laterality Date  . BREAST EXCISIONAL BIOPSY Right 08/2019   Benign, lesion   . BREAST LUMPECTOMY Left 09/2019  . BREAST LUMPECTOMY WITH RADIOACTIVE SEED AND SENTINEL LYMPH NODE BIOPSY Left 10/06/2019   Procedure: LEFT BREAST LUMPECTOMY  X2 WITH RADIOACTIVE SEED X2  AND SENTINEL LYMPH NODE BIOPSY;  Surgeon: Shelly Miner, MD;  Location: [facility];   Service: General;  Laterality: Left;  . BREAST LUMPECTOMY WITH RADIOACTIVE SEED LOCALIZATION Right 10/06/2019   Procedure: RIGHT BREAST LUMPECTOMY WITH RADIOACTIVE SEED LOCALIZATION;  Surgeon: Shelly Miner, MD;  Location: [facility];  Service: General;  Laterality: Right;  . COLONOSCOPY    . DIAGNOSTIC LAPAROSCOPY     cysts  . DILATATION & CURETTAGE/HYSTEROSCOPY WITH TRUECLEAR N/A 10/31/2013   Procedure: DILATATION & CURETTAGE/HYSTEROSCOPY WITH TRUCLEAR;  Surgeon: Zelphia Cairo, MD;  Location: [facility];  Service: Gynecology;  Laterality: N/A;  . DILATION AND CURETTAGE OF UTERUS    . MASS EXCISION Left 11/06/2019   Procedure: EXCISION NIPPLE AND AREOLA  MARGIN LEFT BREAST;  Surgeon: Shelly Miner, MD;  Location: [facility];  Service: General;  Laterality: Left;  . TONSILLECTOMY  1952  . WISDOM TOOTH EXTRACTION      I have reviewed the social history and family history with the patient and they are unchanged  from previous note.  ALLERGIES:  is allergic to taxotere [docetaxel], abraxane [paclitaxel protein-bound part], crestor [rosuvastatin calcium], lipitor [atorvastatin calcium], nsaids, pravastatin, and sulfonamide derivatives.  MEDICATIONS:  Current Outpatient Medications  Medication Sig Dispense Refill  . acetaminophen (TYLENOL) 500 MG tablet Take 500 mg by mouth every 6 (six) hours as needed for headache.    . denosumab (PROLIA) 60 MG/ML SOSY injection Inject 60 mg into the skin every 6 (six) months.    . esomeprazole (NEXIUM) 20 MG capsule Take 20 mg by mouth daily as needed (acid reflux).     Marland Kitchen loratadine (CLARITIN) 10 MG tablet Take 10 mg by mouth daily as needed for allergies.     Marland Kitchen PRESCRIPTION MEDICATION Apply 1 application topically at bedtime. Cream provided by hospital for breast blisters after radiation    . sertraline (ZOLOFT) 50 MG tablet TAKE 1 TABLET BY MOUTH EVERY DAY 90 tablet 1  . Thiamine HCl (VITAMIN B-1 PO) Take 1 tablet by mouth  daily.    . Vitamin D, Ergocalciferol, (DRISDOL) 1.25 MG (50000 UNIT) CAPS capsule TAKE 1 CAPSULE (50,000 UNITS TOTAL) BY MOUTH EVERY 7 (SEVEN) DAYS. 12 capsule 1   No current facility-administered medications for this visit.    PHYSICAL EXAMINATION: ECOG PERFORMANCE STATUS: 1 - Symptomatic but completely ambulatory  No vitals taken today, Exam not performed today   LABORATORY DATA:  I have reviewed the data as listed CBC Latest Ref Rng & Units 12/04/2020 09/03/2020 02/12/2020  WBC 4.0 - 10.5 K/uL 8.5 5.8 5.0  Hemoglobin 12.0 - 15.0 g/dL 11.5(L) 13.4 13.9  Hematocrit 36.0 - 46.0 % 36.8 40.8 42.9  Platelets 150 - 400 K/uL 445(H) 265 253     CMP Latest Ref Rng & Units 12/04/2020 09/03/2020 08/14/2020  Glucose 70 - 99 mg/dL 107(H) 105(H) 106(H)  BUN 8 - 23 mg/dL _0 Creatinine 0.44 - 1.00 mg/dL 0.88 0.84 0.92  Sodium 135 - 145 mmol/L 138 139 137  Potassium 3.5 - 5.1 mmol/L 4.3 4.1 4.2  Chloride 98 - 111 mmol/L 102 105 101  CO2 22 - 32 mmol/L _1 Calcium 8.9 - 10.3 mg/dL 9.1 9.6 9.0  Total Protein 6.5 - 8.1 g/dL 7.5 6.9 -  Total Bilirubin 0.3 - 1.2 mg/dL 0.3 0.3 -  Alkaline Phos 38 - 126 U/L 75 73 -  AST 15 - 41 U/L 10(L) 14(L) -  ALT 0 - 44 U/L 10 13 -      RADIOGRAPHIC STUDIES: I have personally reviewed the radiological images as listed and agreed with the findings in the report. CT Chest Wo Contrast  Result Date: 12/04/2020 CLINICAL DATA:  Lung nodule follow-up. Nodular area discovered in the LEFT upper chest. EXAM: CT CHEST WITHOUT CONTRAST TECHNIQUE: Multidetector CT imaging of the chest was performed following the standard protocol without IV contrast. COMPARISON:  September 03, 2020 FINDINGS: Cardiovascular: Calcified atheromatous plaque in the thoracic aorta. Calcified coronary artery disease. Heart size is normal. Calcified coronary artery disease in LEFT and RIGHT coronary circulation. Central pulmonary vasculature is unremarkable in terms of caliber. Limited  assessment of cardiovascular structures given lack of intravenous contrast. Mediastinum/Nodes: No mediastinal lymphadenopathy. Post LEFT breast lumpectomy and axillary dissection. No axillary lymphadenopathy. No thoracic inlet lymphadenopathy. Lungs/Pleura: Interval development of area of consolidative change in the RIGHT upper lobe with some surrounding ground-glass measuring approximately 2.8 x 2.9 cm. New nodule in the RIGHT lower lobe measuring 12 x 11 mm (image 113, series 7) Peripheral consolidative  changes in the LEFT lung base involving the LEFT lower lobe with a crescentic appearance sparing subpleural lung. Areas of ground-glass and septal thickening in the LEFT upper lobe have increased. The area of nodularity in the LEFT upper lobe has nearly completely resolved with only minimal ground-glass present on today's study with consolidative changes and septal thickening tracking into the lingula and along the fissure in the LEFT chest. Consolidative changes in the LEFT lower lobe extend from the medial lung sparing subpleural lung and contacting the posterior aspect of the major fissure. Ground-glass nodules are present in the subpleural lung that do not show subpleural sparing, for instance on image 55 of series 7 in the LEFT lower lobe. Airways are patent. Upper Abdomen: Incidental imaging of upper abdominal contents is unremarkable with stable LEFT adrenal nodule compatible with benign adenoma measuring 12 mm. Musculoskeletal: No acute musculoskeletal process. No destructive bone finding. Osteopenia with signs of T11 compression fracture unchanged since March of 2021. IMPRESSION: 1. Marked interval worsening of parenchymal disease now involving areas of the LEFT lower lobe, lingula, RIGHT upper lobe and with nodular opacity in the RIGHT lower lobe. Findings could be seen in the setting of COVID-19 infection. Pattern of disease also raises the question of organizing pneumonia. Pneumonitis, drug related  changes are also considered. Pattern of disease would be atypical for neoplasm. Pulmonary consultation is suggested if not yet performed. Continued short interval surveillance is also suggested. 2. Post LEFT breast lumpectomy and axillary dissection. 3. Calcified coronary artery disease in LEFT and RIGHT coronary circulation. 4. Stable LEFT adrenal nodule compatible with benign adenoma. 5. Aortic atherosclerosis. Aortic Atherosclerosis (ICD10-I70.0). Electronically Signed   By: Zetta Bills M.D.   On: 12/04/2020 11:53     ASSESSMENT & PLAN:  Shelly [name] is a 75 y.o. female with   1. Multifocal infiltrates in bilateral lungs  -Her 09/03/20 CT chest showed 2.2 x 1.8 cm area of mixed density in the left upper lobe with surrounding area of groundglass changes. This could be related to her previous left breast radiation or inflammatory changes, although malignancy is not ruled out but less likely. She is asymptomatic, no clinical concern for infection. -I personally reviewed her CT chest from 12/04/20 which shows Marked interval worsening of parenchymal disease now involving areas of the LEFT lower lobe, lingula, RIGHT upper lobe and with nodular opacity in the RIGHT lower lobe. Findings could be seen in the setting of COVID-19 infection or PNA, or interstitial disease  -She notes she was exposed to [place] by son-in-law in 10/2020. She tested negative around that time but felt bad for about 2 weeks.  -She notes she currently has post nasal drip which leads to dry cough. She is otherwise asymptomatic. I recommend she proceed with new COVID test based on CT scan and I will refer her to Pulmonologist for further work up. I will also discuss this in Thoracic Tumor Board although I have low suspicion this is related to cancer.  -I recommend she use oxygen meter at home to monitor at home.    2.Cancer of central portion of left female breast, StageIA, c(T1bN0M0), ER/PR+, HER2-, GradeII, pT1cN1aM0,  stage IA -She was diagnosed in 08/2019. She underwent B/l breast lumpectomy and SLNB with Dr. Marlou [name] on 10/06/19.Due to positive margins and 1/3 positive LNs she underwent re-excisional surgery on 11/06/19 which showed DCIS only, margins negative.She has high risk for recurrence based on Mammaprint. -She tried adjuvant chemo with TC and Abraxane but could not  tolerate either. She did complete adjuvant RT  with Dr Isidore Moos 01/25/20-03/06/20. -She did not want to pursue Genetic testing  -I started her on antiestrogen therapy with Tamoxifen in 03/2020. She took until 06/06/20 and 75m 06/04/20-06/2020 but stopped due to exacerbated IBS symptoms. She also tried 2 weeks of Exemestane in 07/2020 but stopped due to increased Anxiety.   3. Osteoporosis, Vitamin D deficiency  -[place] prior to 2007 [place] Last DEXA in 06/2015 (-3.2 at [facility]).  -On Prolia injections every 6 months since 2017-2018. Was restarted on 01/30/20 in our clinic.  -Continue high dose Vit D and Tums (for calcium)   4. Diarrhea and Acid Reflux  -Her BM are controlled on Librax and Nexium and imodium. No recent Diarrhea   5. Anxiety,insomnia -She is nervous about her cancer diagnosis andchemo.  -She has had episodes of panic attack. She did have trouble sleeping, etiology likely multifactorial.  -She is also on Ativan. She can use benadryl and Melatonin to help her sleep if needed. -She had increased anxiety on Exemestane. I recommend starting SSRI with Zoloft for 2 weeks. If tolerable will try restarting Exemestane. She is agreeable.  -She notes being depressed lately and feels anxious about her health declining.    6. H/o smoking, Emphysema, abnormal CT chest in 01/2020 and 09/03/20 -She has h/o smoking.  -given abnormal CT chests, latest on 12/04/20, will consult with Pulmonologist.    PLAN: -COVID test this week  -Send Pulmonology referral.  -will present her case in thoracic tumor board next week -lab  and f/u in 3 months   No problem-specific Assessment & Plan notes found for this encounter.   No orders of the defined types were placed in this encounter.  I discussed the assessment and treatment plan with the patient. The patient was provided an opportunity to ask questions and all were answered. The patient agreed with the plan and demonstrated an understanding of the instructions.  The patient was advised to call back or seek an in-person evaluation if the symptoms worsen or if the condition fails to improve as anticipated.  The total time spent in the appointment was 25 minutes.    YTruitt Merle MD 12/04/2020   I, AJoslyn Devon am acting as scribe for YTruitt Merle MD.   I have reviewed the above documentation for accuracy and completeness, and I agree with the above.

## 2020-12-05 ENCOUNTER — Encounter: Payer: Medicare Other | Admitting: Physical Therapy

## 2020-12-05 NOTE — Addendum Note (Signed)
Addended by: Truitt Merle on: 12/05/2020 12:25 PM   Modules accepted: Orders

## 2020-12-06 ENCOUNTER — Telehealth: Payer: Self-pay | Admitting: Pulmonary Disease

## 2020-12-06 ENCOUNTER — Ambulatory Visit: Payer: Medicare Other | Admitting: Hematology

## 2020-12-06 ENCOUNTER — Telehealth: Payer: Self-pay | Admitting: Hematology

## 2020-12-06 NOTE — Telephone Encounter (Signed)
-----   Message from Truitt Merle, MD sent at 12/06/2020  7:33 AM EST ----- Regarding: RE: Radiology Follow Up Yes, I referred her to your department a few days ago, she does not have much symptoms except postnasal drip and cough in morning which is not new. She had COVID exposure 6 weeks ago and was tested negative at that time. I told her to test COVID again, I do not know the result yet.  Please see her in the next few weeks if you can, thanks much  Shelly [name]  ----- Message ----- From: Garner Nash, DO Sent: 12/06/2020   6:24 AM EST To: Dondra Prader, MD Subject: RE: Radiology Follow Up                         Shelly [name],  I received a referral on the incidental findings program regarding Shelly [name]. I just wanted to reach out and if you would like for [place] to see her let me know.  Thanks Brad     ----- Message ----- From: Annie Paras Sent: 12/05/2020   7:46 PM EST To: [place] [facility] Requiring Referral Subject: Radiology Follow Up                            Marked interval worsening of parenchymal disease now involving areas of the LEFT lower lobe, lingula, RIGHT upper lobe and with nodular opacity in the RIGHT lower lobe. Findings could be seen in the setting of COVID-19 infection. Pattern of disease also raises the question of organizing pneumonia. Pneumonitis, drug related changes are also considered. Pattern of disease would be atypical for neoplasm. Pulmonary consultation is suggested if not yet performed. Continued short interval surveillance is also suggested.

## 2020-12-06 NOTE — Telephone Encounter (Signed)
No 1/19 los. No changes made to pt's schedule.  

## 2020-12-06 NOTE — Telephone Encounter (Signed)
[facility]:  We were contacted by the [place] Incidental Findings Program.  Recommendations: persistent parenchymal abnormalities  Appointment requested: Next available appointment with RB or BI to consider needs for bronchoscopy  Time frame: Next available  [facility] 12/06/2020 8:06 AM

## 2020-12-09 ENCOUNTER — Encounter: Payer: Medicare Other | Admitting: Physical Therapy

## 2020-12-12 ENCOUNTER — Encounter: Payer: Medicare Other | Admitting: Physical Therapy

## 2020-12-16 ENCOUNTER — Encounter: Payer: Medicare Other | Admitting: Physical Therapy

## 2020-12-16 ENCOUNTER — Other Ambulatory Visit: Payer: Self-pay | Admitting: *Deleted

## 2020-12-16 DIAGNOSIS — M25561 Pain in right knee: Secondary | ICD-10-CM

## 2020-12-16 NOTE — Progress Notes (Signed)
The proposed treatment discussed in cancer conference is for discussion purpose only and is not a binding recommendation. The patient was not physically examined nor present for their treatment options. Therefore, final treatment plans cannot be decided.  ?

## 2020-12-17 ENCOUNTER — Telehealth: Payer: Self-pay | Admitting: Hematology

## 2020-12-17 NOTE — Telephone Encounter (Signed)
Scheduled appointments per 1/31 sch msg. Spoke to patient who is aware of appointments date and times.

## 2020-12-18 ENCOUNTER — Other Ambulatory Visit: Payer: Self-pay

## 2020-12-18 ENCOUNTER — Ambulatory Visit (INDEPENDENT_AMBULATORY_CARE_PROVIDER_SITE_OTHER): Payer: Medicare Other | Admitting: Pulmonary Disease

## 2020-12-18 ENCOUNTER — Encounter: Payer: Self-pay | Admitting: Pulmonary Disease

## 2020-12-18 VITALS — BP 132/70 | HR 82 | Temp 98.2°F | Ht 64.0 in | Wt 173.2 lb

## 2020-12-18 DIAGNOSIS — R9389 Abnormal findings on diagnostic imaging of other specified body structures: Secondary | ICD-10-CM

## 2020-12-18 DIAGNOSIS — R61 Generalized hyperhidrosis: Secondary | ICD-10-CM | POA: Diagnosis not present

## 2020-12-18 DIAGNOSIS — Z853 Personal history of malignant neoplasm of breast: Secondary | ICD-10-CM | POA: Diagnosis not present

## 2020-12-18 DIAGNOSIS — R918 Other nonspecific abnormal finding of lung field: Secondary | ICD-10-CM | POA: Diagnosis not present

## 2020-12-18 DIAGNOSIS — R0602 Shortness of breath: Secondary | ICD-10-CM

## 2020-12-18 NOTE — Progress Notes (Signed)
Synopsis: Referred in February 2022 for abnormal CT chest, history of breast cancer, PCP by Eulas Post, MD  Subjective:   PATIENT ID: Shelly [name] GENDER: female DOB: November 27, 1945, MRN: [id number]  Chief Complaint  Patient presents with  . Consult    Hx of breast cancer.  CT scan done and showed some opacities in her lungs.  ? Infection in her lung.  PND every night and this causes her to cough. Now has a dry cough.     This is a 75 year old female, past medical history of arthritis, IBS, hyperlipidemia, osteoporosis, vitamin D deficiency.  Followed by Dr. Annamaria Boots from medical oncology patient was diagnosed in November 2020 with a stage Ia (cT1b, N0, M0, G2, ER positive, PR positive, HER-2 negative, breast cancer patient underwent lumpectomy and sentinel node biopsy, patient completed adjuvant chemotherapy and radiation with Dr. Isidore Moos..  Patient Covid vaccine plus booster.  She felt sick after a Covid exposure but at the time she states she tested negative.  She had a follow-up CT image from oncology on 12/04/2020 which revealed bilateral consolidations and infiltrates.  Recommending close follow-up.  Patient was referred for evaluation by pulmonary regarding abnormality seen on CT imaging.  OV 12/18/2020: Here today for follow-up and evaluation of abnormal CT imaging.  She does complain today of dyspnea on exertion, shortness of breath.  It is usually worse in the morning.  She denies hemoptysis.  She does have night sweats that occur 3-4 times a week.  All of which are new over the past couple of months.       Past Medical History:  Diagnosis Date  . Arthritis    wrist, shoulder   . Cancer ([place]) 08/2019   left breast IDC  . Diverticulitis   . Endometrial polyp   . Family history of breast cancer   . GERD (gastroesophageal reflux disease)    occasional - diet controlled  . Hx of adenomatous colonic polyps   . Hyperlipidemia   . IBS (irritable bowel syndrome)   . Missed  abortion    x 1 - resolved - no surgery  . Osteoporosis   . SVD (spontaneous vaginal delivery)    x 1  . Transient vision disturbance 2019   was evaluated by ED, no findings  . Vitamin D deficiency      Family History  Problem Relation Age of Onset  . Hyperlipidemia Mother   . Hypertension Mother   . Diabetes Mother        type ll  . Leukemia Mother 83  . Lung cancer Father   . Hyperlipidemia Father   . Breast cancer Cousin 30       maternal first couin  . Heart disease Other   . Hypertension Other   . Cancer Other   . Depression Brother   . Breast cancer Maternal Aunt 85  . Uterine cancer Paternal Grandmother   . Breast cancer Maternal Aunt 60  . Lung cancer Maternal Grandfather   . Cancer Maternal Grandfather        gastric cancer?  . Cancer Maternal Uncle        unknown  . Brain cancer Paternal Uncle   . Throat cancer Paternal Uncle      Past Surgical History:  Procedure Laterality Date  . BREAST EXCISIONAL BIOPSY Right 08/2019   Benign, lesion   . BREAST LUMPECTOMY Left 09/2019  . BREAST LUMPECTOMY WITH RADIOACTIVE SEED AND SENTINEL LYMPH NODE BIOPSY Left 10/06/2019  Procedure: LEFT BREAST LUMPECTOMY  X2 WITH RADIOACTIVE SEED X2  AND SENTINEL LYMPH NODE BIOPSY;  Surgeon: Jovita Kussmaul, MD;  Location: [facility];  Service: General;  Laterality: Left;  . BREAST LUMPECTOMY WITH RADIOACTIVE SEED LOCALIZATION Right 10/06/2019   Procedure: RIGHT BREAST LUMPECTOMY WITH RADIOACTIVE SEED LOCALIZATION;  Surgeon: Jovita Kussmaul, MD;  Location: [facility];  Service: General;  Laterality: Right;  . COLONOSCOPY    . DIAGNOSTIC LAPAROSCOPY     cysts  . DILATATION & CURETTAGE/HYSTEROSCOPY WITH TRUECLEAR N/A 10/31/2013   Procedure: DILATATION & CURETTAGE/HYSTEROSCOPY WITH TRUCLEAR;  Surgeon: Marylynn Pearson, MD;  Location: [facility];  Service: Gynecology;  Laterality: N/A;  . DILATION AND CURETTAGE OF UTERUS    . MASS EXCISION Left 11/06/2019    Procedure: EXCISION NIPPLE AND AREOLA  MARGIN LEFT BREAST;  Surgeon: Jovita Kussmaul, MD;  Location: [facility];  Service: General;  Laterality: Left;  . TONSILLECTOMY  1952  . WISDOM TOOTH EXTRACTION      Social History   Socioeconomic History  . Marital status: Married    Spouse name: Not on file  . Number of children: 1  . Years of education: Not on file  . Highest education level: Not on file  Occupational History  . Not on file  Tobacco Use  . Smoking status: Former Smoker    Packs/day: 0.10    Years: 20.00    Pack years: 2.00    Types: Cigarettes    Quit date: 05/15/2007    Years since quitting: 13.6  . Smokeless tobacco: Never Used  Vaping Use  . Vaping Use: Never used  Substance and Sexual Activity  . Alcohol use: Yes    Alcohol/week: 14.0 standard drinks    Types: 14 Glasses of wine per week    Comment: 2 glasses wine every evening  . Drug use: No  . Sexual activity: Yes    Birth control/protection: Post-menopausal  Other Topics Concern  . Not on file  Social History Narrative   Lives with husband in 2 story house   Has one daughter, with 3 grandchildren, local, supportive   Enjoys gardening/walking outside with husband   Occasionally does home exercise calisthenics but not recently.   Well-balanced diet overall   Social Determinants of Health   Financial Resource Strain: Not on file  Food Insecurity: Not on file  Transportation Needs: Not on file  Physical Activity: Not on file  Stress: Not on file  Social Connections: Not on file  Intimate Partner Violence: Not on file     Allergies  Allergen Reactions  . Taxotere [Docetaxel] Swelling  . Abraxane [Paclitaxel Protein-Bound Part] Other (See Comments)    3 days after receiving she had one episode of very high BP and HR. This happened again 2 days later during the night.   Marland Kitchen Crestor [Rosuvastatin Calcium] Itching    GI upset  . Lipitor [Atorvastatin Calcium] Itching  . Nsaids Other  (See Comments)    GI upset  . Pravastatin Itching    GI upset  . Sulfonamide Derivatives Rash    Hives, itiching     Outpatient Medications Prior to Visit  Medication Sig Dispense Refill  . acetaminophen (TYLENOL) 500 MG tablet Take 500 mg by mouth every 6 (six) hours as needed for headache.    . denosumab (PROLIA) 60 MG/ML SOSY injection Inject 60 mg into the skin every 6 (six) months.    . esomeprazole (NEXIUM) 20 MG  capsule Take 20 mg by mouth daily as needed (acid reflux).     Marland Kitchen PRESCRIPTION MEDICATION Apply 1 application topically at bedtime. Cream provided by hospital for breast blisters after radiation    . Thiamine HCl (VITAMIN B-1 PO) Take 1 tablet by mouth daily.    . Vitamin D, Ergocalciferol, (DRISDOL) 1.25 MG (50000 UNIT) CAPS capsule TAKE 1 CAPSULE (50,000 UNITS TOTAL) BY MOUTH EVERY 7 (SEVEN) DAYS. 12 capsule 1  . loratadine (CLARITIN) 10 MG tablet Take 10 mg by mouth daily as needed for allergies.  (Patient not taking: Reported on 12/18/2020)    . sertraline (ZOLOFT) 50 MG tablet TAKE 1 TABLET BY MOUTH EVERY DAY (Patient not taking: Reported on 12/18/2020) 90 tablet 1   No facility-administered medications prior to visit.    Review of Systems  Constitutional: Positive for malaise/fatigue. Negative for chills, fever and weight loss.       Night sweats  HENT: Negative for hearing loss, sore throat and tinnitus.   Eyes: Negative for blurred vision and double vision.  Respiratory: Positive for shortness of breath. Negative for cough, hemoptysis, sputum production, wheezing and stridor.   Cardiovascular: Negative for chest pain, palpitations, orthopnea, leg swelling and PND.  Gastrointestinal: Negative for abdominal pain, constipation, diarrhea, heartburn, nausea and vomiting.  Genitourinary: Negative for dysuria, hematuria and urgency.  Musculoskeletal: Negative for joint pain and myalgias.  Skin: Negative for itching and rash.  Neurological: Negative for dizziness, tingling,  weakness and headaches.  Endo/Heme/Allergies: Negative for environmental allergies. Does not bruise/bleed easily.  Psychiatric/Behavioral: Negative for depression. The patient is nervous/anxious. The patient does not have insomnia.   All other systems reviewed and are negative.    Objective:  Physical Exam Vitals reviewed.  Constitutional:      General: She is not in acute distress.    Appearance: She is well-developed and well-nourished.  HENT:     Head: Normocephalic and atraumatic.     Mouth/Throat:     Mouth: Oropharynx is clear and moist.  Eyes:     General: No scleral icterus.    Conjunctiva/sclera: Conjunctivae normal.     Pupils: Pupils are equal, round, and reactive to light.  Neck:     Vascular: No JVD.     Trachea: No tracheal deviation.  Cardiovascular:     Rate and Rhythm: Normal rate and regular rhythm.     Pulses: Intact distal pulses.     Heart sounds: Normal heart sounds. No murmur heard.   Pulmonary:     Effort: Pulmonary effort is normal. No tachypnea, accessory muscle usage or respiratory distress.     Breath sounds: Normal breath sounds. No stridor. No wheezing, rhonchi or rales.  Abdominal:     General: Bowel sounds are normal. There is no distension.     Palpations: Abdomen is soft.     Tenderness: There is no abdominal tenderness.  Musculoskeletal:        General: No tenderness or edema.     Cervical back: Neck supple.  Lymphadenopathy:     Cervical: No cervical adenopathy.  Skin:    General: Skin is warm and dry.     Capillary Refill: Capillary refill takes less than 2 seconds.     Findings: No rash.  Neurological:     Mental Status: She is alert and oriented to person, place, and time.  Psychiatric:        Mood and Affect: Mood and affect normal.        Behavior: Behavior  normal.     Vitals:   12/18/20 1031  BP: 132/70  Pulse: 82  Temp: 98.2 F (36.8 C)  TempSrc: Tympanic  SpO2: 95%  Weight: 173 lb 4 oz (78.6 kg)  Height: $Remove'5\' 4"'LasxPna$   (1.626 m)   95% on  RA BMI Readings from Last 3 Encounters:  12/18/20 29.74 kg/m  09/06/20 30.99 kg/m  08/14/20 30.72 kg/m   Wt Readings from Last 3 Encounters:  12/18/20 173 lb 4 oz (78.6 kg)  09/06/20 181 lb 11.2 oz (82.4 kg)  08/14/20 180 lb 1.6 oz (81.7 kg)     CBC    Component Value Date/Time   WBC 8.5 12/04/2020 0945   WBC 5.0 02/12/2020 1230   RBC 4.32 12/04/2020 0945   HGB 11.5 (L) 12/04/2020 0945   HCT 36.8 12/04/2020 0945   PLT 445 (H) 12/04/2020 0945   MCV 85.2 12/04/2020 0945   MCH 26.6 12/04/2020 0945   MCHC 31.3 12/04/2020 0945   RDW 13.8 12/04/2020 0945   LYMPHSABS 1.3 12/04/2020 0945   MONOABS 0.7 12/04/2020 0945   EOSABS 0.2 12/04/2020 0945   BASOSABS 0.0 12/04/2020 0945     Chest Imaging: CT chest 12/04/2020: Dense parenchymal abnormalities within the periphery of the left lower lobe as well as right upper lobe.  Airways adjacent concerning for inflammatory process. The patient's images have been independently reviewed by me.    Pulmonary Functions Testing Results: No flowsheet data found.  FeNO:   Pathology:   Echocardiogram:   Heart Catheterization:     Assessment & Plan:     ICD-10-CM   1. Pulmonary infiltrate present on computed tomography  R91.8 Ambulatory referral to Pulmonology  2. Abnormal CT of the chest  R93.89 Ambulatory referral to Pulmonology  3. History of breast cancer  Z85.3 Ambulatory referral to Pulmonology  4. Night sweats  R61   5. SOB (shortness of breath)  R06.02     Discussion:  This is a 75 year old female with a diffuse parenchymal abnormality which predominates the left lung.  She has had radiation to the left breast but this abnormalities were not seen on CT imaging back in October of this past year.  The areas are peribronchovascular with visible airways.  I think the differential diagnosis includes diseases such as organizing pneumonia, and acute parenchymal lung disease related to drug toxicity,  amyloidosis, atypical infections.  Unlikely that we are dealing with a malignancy.  Other things that concern me are her new onset of night sweats.  Plan: We discussed the next best steps in evaluation. We will proceed with bronchoscopy. She does have some small adenopathy within the mediastinum which I suspect is reactive. We will plan for endobronchial ultrasound transbronchial needle aspiration since will be there at that time. Additionally we will plan for transbronchial biopsies of the parenchymal abnormalities if it seen well on fluoroscopy we may not need to use navigation however we will plan to be able to access the space appropriately with navigational bronchoscopy if needed. Patient is agreeable to this plan. We discussed the risk benefits and alternatives of proceeding with navigational bronchoscopy, endobronchial ultrasound to help establish a diagnosis.  All images were reviewed with patient today in the office.    Current Outpatient Medications:  .  acetaminophen (TYLENOL) 500 MG tablet, Take 500 mg by mouth every 6 (six) hours as needed for headache., Disp: , Rfl:  .  denosumab (PROLIA) 60 MG/ML SOSY injection, Inject 60 mg into the skin every 6 (six) months.,  Disp: , Rfl:  .  esomeprazole (NEXIUM) 20 MG capsule, Take 20 mg by mouth daily as needed (acid reflux). , Disp: , Rfl:  .  PRESCRIPTION MEDICATION, Apply 1 application topically at bedtime. Cream provided by hospital for breast blisters after radiation, Disp: , Rfl:  .  Thiamine HCl (VITAMIN B-1 PO), Take 1 tablet by mouth daily., Disp: , Rfl:  .  Vitamin D, Ergocalciferol, (DRISDOL) 1.25 MG (50000 UNIT) CAPS capsule, TAKE 1 CAPSULE (50,000 UNITS TOTAL) BY MOUTH EVERY 7 (SEVEN) DAYS., Disp: 12 capsule, Rfl: 1  I spent 61 minutes dedicated to the care of this patient on the date of this encounter to include pre-visit review of records, face-to-face time with the patient discussing conditions above, post visit ordering of  testing, clinical documentation with the electronic health record, making appropriate referrals as documented, and communicating necessary findings to members of the patients care team.   Garner Nash, DO [facility] 12/18/2020 10:58 AM

## 2020-12-18 NOTE — Patient Instructions (Signed)
Thank you for visiting Dr. Valeta Harms at [facility]. Today we recommend the following:  Orders Placed This Encounter  Procedures  . Ambulatory referral to Pulmonology   Nothing to eat the night before procedure past midnight Case scheduled for 12/27/2020 Please expect phone calls from pre-op and scheduling   Return in about 4 weeks (around 01/15/2021) for with APP or Dr. Valeta Harms.    Please do your part to reduce the spread of COVID-19.

## 2020-12-18 NOTE — H&P (View-Only) (Signed)
Synopsis: Referred in February 2022 for abnormal CT chest, history of breast cancer, PCP by Eulas Post, MD  Subjective:   PATIENT ID: Shelly [name] GENDER: female DOB: February 02, 1946, MRN: [id number]  Chief Complaint  Patient presents with  . Consult    Hx of breast cancer.  CT scan done and showed some opacities in her lungs.  ? Infection in her lung.  PND every night and this causes her to cough. Now has a dry cough.     This is a 75 year old female, past medical history of arthritis, IBS, hyperlipidemia, osteoporosis, vitamin D deficiency.  Followed by Dr. Annamaria Boots from medical oncology patient was diagnosed in November 2020 with a stage Ia (cT1b, N0, M0, G2, ER positive, PR positive, HER-2 negative, breast cancer patient underwent lumpectomy and sentinel node biopsy, patient completed adjuvant chemotherapy and radiation with Dr. Isidore Moos..  Patient Covid vaccine plus booster.  She felt sick after a Covid exposure but at the time she states she tested negative.  She had a follow-up CT image from oncology on 12/04/2020 which revealed bilateral consolidations and infiltrates.  Recommending close follow-up.  Patient was referred for evaluation by pulmonary regarding abnormality seen on CT imaging.  OV 12/18/2020: Here today for follow-up and evaluation of abnormal CT imaging.  She does complain today of dyspnea on exertion, shortness of breath.  It is usually worse in the morning.  She denies hemoptysis.  She does have night sweats that occur 3-4 times a week.  All of which are new over the past couple of months.       Past Medical History:  Diagnosis Date  . Arthritis    wrist, shoulder   . Cancer ([place]) 08/2019   left breast IDC  . Diverticulitis   . Endometrial polyp   . Family history of breast cancer   . GERD (gastroesophageal reflux disease)    occasional - diet controlled  . Hx of adenomatous colonic polyps   . Hyperlipidemia   . IBS (irritable bowel syndrome)   . Missed  abortion    x 1 - resolved - no surgery  . Osteoporosis   . SVD (spontaneous vaginal delivery)    x 1  . Transient vision disturbance 2019   was evaluated by ED, no findings  . Vitamin D deficiency      Family History  Problem Relation Age of Onset  . Hyperlipidemia Mother   . Hypertension Mother   . Diabetes Mother        type ll  . Leukemia Mother 77  . Lung cancer Father   . Hyperlipidemia Father   . Breast cancer Cousin 30       maternal first couin  . Heart disease Other   . Hypertension Other   . Cancer Other   . Depression Brother   . Breast cancer Maternal Aunt 67  . Uterine cancer Paternal Grandmother   . Breast cancer Maternal Aunt 42  . Lung cancer Maternal Grandfather   . Cancer Maternal Grandfather        gastric cancer?  . Cancer Maternal Uncle        unknown  . Brain cancer Paternal Uncle   . Throat cancer Paternal Uncle      Past Surgical History:  Procedure Laterality Date  . BREAST EXCISIONAL BIOPSY Right 08/2019   Benign, lesion   . BREAST LUMPECTOMY Left 09/2019  . BREAST LUMPECTOMY WITH RADIOACTIVE SEED AND SENTINEL LYMPH NODE BIOPSY Left 10/06/2019  Procedure: LEFT BREAST LUMPECTOMY  X2 WITH RADIOACTIVE SEED X2  AND SENTINEL LYMPH NODE BIOPSY;  Surgeon: Jovita Kussmaul, MD;  Location: [facility];  Service: General;  Laterality: Left;  . BREAST LUMPECTOMY WITH RADIOACTIVE SEED LOCALIZATION Right 10/06/2019   Procedure: RIGHT BREAST LUMPECTOMY WITH RADIOACTIVE SEED LOCALIZATION;  Surgeon: Jovita Kussmaul, MD;  Location: [facility];  Service: General;  Laterality: Right;  . COLONOSCOPY    . DIAGNOSTIC LAPAROSCOPY     cysts  . DILATATION & CURETTAGE/HYSTEROSCOPY WITH TRUECLEAR N/A 10/31/2013   Procedure: DILATATION & CURETTAGE/HYSTEROSCOPY WITH TRUCLEAR;  Surgeon: Marylynn Pearson, MD;  Location: [facility];  Service: Gynecology;  Laterality: N/A;  . DILATION AND CURETTAGE OF UTERUS    . MASS EXCISION Left 11/06/2019    Procedure: EXCISION NIPPLE AND AREOLA  MARGIN LEFT BREAST;  Surgeon: Jovita Kussmaul, MD;  Location: [facility];  Service: General;  Laterality: Left;  . TONSILLECTOMY  1952  . WISDOM TOOTH EXTRACTION      Social History   Socioeconomic History  . Marital status: Married    Spouse name: Not on file  . Number of children: 1  . Years of education: Not on file  . Highest education level: Not on file  Occupational History  . Not on file  Tobacco Use  . Smoking status: Former Smoker    Packs/day: 0.10    Years: 20.00    Pack years: 2.00    Types: Cigarettes    Quit date: 05/15/2007    Years since quitting: 13.6  . Smokeless tobacco: Never Used  Vaping Use  . Vaping Use: Never used  Substance and Sexual Activity  . Alcohol use: Yes    Alcohol/week: 14.0 standard drinks    Types: 14 Glasses of wine per week    Comment: 2 glasses wine every evening  . Drug use: No  . Sexual activity: Yes    Birth control/protection: Post-menopausal  Other Topics Concern  . Not on file  Social History Narrative   Lives with husband in 2 story house   Has one daughter, with 3 grandchildren, local, supportive   Enjoys gardening/walking outside with husband   Occasionally does home exercise calisthenics but not recently.   Well-balanced diet overall   Social Determinants of Health   Financial Resource Strain: Not on file  Food Insecurity: Not on file  Transportation Needs: Not on file  Physical Activity: Not on file  Stress: Not on file  Social Connections: Not on file  Intimate Partner Violence: Not on file     Allergies  Allergen Reactions  . Taxotere [Docetaxel] Swelling  . Abraxane [Paclitaxel Protein-Bound Part] Other (See Comments)    3 days after receiving she had one episode of very high BP and HR. This happened again 2 days later during the night.   Marland Kitchen Crestor [Rosuvastatin Calcium] Itching    GI upset  . Lipitor [Atorvastatin Calcium] Itching  . Nsaids Other  (See Comments)    GI upset  . Pravastatin Itching    GI upset  . Sulfonamide Derivatives Rash    Hives, itiching     Outpatient Medications Prior to Visit  Medication Sig Dispense Refill  . acetaminophen (TYLENOL) 500 MG tablet Take 500 mg by mouth every 6 (six) hours as needed for headache.    . denosumab (PROLIA) 60 MG/ML SOSY injection Inject 60 mg into the skin every 6 (six) months.    . esomeprazole (NEXIUM) 20 MG  capsule Take 20 mg by mouth daily as needed (acid reflux).     Marland Kitchen PRESCRIPTION MEDICATION Apply 1 application topically at bedtime. Cream provided by hospital for breast blisters after radiation    . Thiamine HCl (VITAMIN B-1 PO) Take 1 tablet by mouth daily.    . Vitamin D, Ergocalciferol, (DRISDOL) 1.25 MG (50000 UNIT) CAPS capsule TAKE 1 CAPSULE (50,000 UNITS TOTAL) BY MOUTH EVERY 7 (SEVEN) DAYS. 12 capsule 1  . loratadine (CLARITIN) 10 MG tablet Take 10 mg by mouth daily as needed for allergies.  (Patient not taking: Reported on 12/18/2020)    . sertraline (ZOLOFT) 50 MG tablet TAKE 1 TABLET BY MOUTH EVERY DAY (Patient not taking: Reported on 12/18/2020) 90 tablet 1   No facility-administered medications prior to visit.    Review of Systems  Constitutional: Positive for malaise/fatigue. Negative for chills, fever and weight loss.       Night sweats  HENT: Negative for hearing loss, sore throat and tinnitus.   Eyes: Negative for blurred vision and double vision.  Respiratory: Positive for shortness of breath. Negative for cough, hemoptysis, sputum production, wheezing and stridor.   Cardiovascular: Negative for chest pain, palpitations, orthopnea, leg swelling and PND.  Gastrointestinal: Negative for abdominal pain, constipation, diarrhea, heartburn, nausea and vomiting.  Genitourinary: Negative for dysuria, hematuria and urgency.  Musculoskeletal: Negative for joint pain and myalgias.  Skin: Negative for itching and rash.  Neurological: Negative for dizziness, tingling,  weakness and headaches.  Endo/Heme/Allergies: Negative for environmental allergies. Does not bruise/bleed easily.  Psychiatric/Behavioral: Negative for depression. The patient is nervous/anxious. The patient does not have insomnia.   All other systems reviewed and are negative.    Objective:  Physical Exam Vitals reviewed.  Constitutional:      General: She is not in acute distress.    Appearance: She is well-developed and well-nourished.  HENT:     Head: Normocephalic and atraumatic.     Mouth/Throat:     Mouth: Oropharynx is clear and moist.  Eyes:     General: No scleral icterus.    Conjunctiva/sclera: Conjunctivae normal.     Pupils: Pupils are equal, round, and reactive to light.  Neck:     Vascular: No JVD.     Trachea: No tracheal deviation.  Cardiovascular:     Rate and Rhythm: Normal rate and regular rhythm.     Pulses: Intact distal pulses.     Heart sounds: Normal heart sounds. No murmur heard.   Pulmonary:     Effort: Pulmonary effort is normal. No tachypnea, accessory muscle usage or respiratory distress.     Breath sounds: Normal breath sounds. No stridor. No wheezing, rhonchi or rales.  Abdominal:     General: Bowel sounds are normal. There is no distension.     Palpations: Abdomen is soft.     Tenderness: There is no abdominal tenderness.  Musculoskeletal:        General: No tenderness or edema.     Cervical back: Neck supple.  Lymphadenopathy:     Cervical: No cervical adenopathy.  Skin:    General: Skin is warm and dry.     Capillary Refill: Capillary refill takes less than 2 seconds.     Findings: No rash.  Neurological:     Mental Status: She is alert and oriented to person, place, and time.  Psychiatric:        Mood and Affect: Mood and affect normal.        Behavior: Behavior  normal.     Vitals:   12/18/20 1031  BP: 132/70  Pulse: 82  Temp: 98.2 F (36.8 C)  TempSrc: Tympanic  SpO2: 95%  Weight: 173 lb 4 oz (78.6 kg)  Height: $Remove'5\' 4"'cRiYzgW$   (1.626 m)   95% on  RA BMI Readings from Last 3 Encounters:  12/18/20 29.74 kg/m  09/06/20 30.99 kg/m  08/14/20 30.72 kg/m   Wt Readings from Last 3 Encounters:  12/18/20 173 lb 4 oz (78.6 kg)  09/06/20 181 lb 11.2 oz (82.4 kg)  08/14/20 180 lb 1.6 oz (81.7 kg)     CBC    Component Value Date/Time   WBC 8.5 12/04/2020 0945   WBC 5.0 02/12/2020 1230   RBC 4.32 12/04/2020 0945   HGB 11.5 (L) 12/04/2020 0945   HCT 36.8 12/04/2020 0945   PLT 445 (H) 12/04/2020 0945   MCV 85.2 12/04/2020 0945   MCH 26.6 12/04/2020 0945   MCHC 31.3 12/04/2020 0945   RDW 13.8 12/04/2020 0945   LYMPHSABS 1.3 12/04/2020 0945   MONOABS 0.7 12/04/2020 0945   EOSABS 0.2 12/04/2020 0945   BASOSABS 0.0 12/04/2020 0945     Chest Imaging: CT chest 12/04/2020: Dense parenchymal abnormalities within the periphery of the left lower lobe as well as right upper lobe.  Airways adjacent concerning for inflammatory process. The patient's images have been independently reviewed by me.    Pulmonary Functions Testing Results: No flowsheet data found.  FeNO:   Pathology:   Echocardiogram:   Heart Catheterization:     Assessment & Plan:     ICD-10-CM   1. Pulmonary infiltrate present on computed tomography  R91.8 Ambulatory referral to Pulmonology  2. Abnormal CT of the chest  R93.89 Ambulatory referral to Pulmonology  3. History of breast cancer  Z85.3 Ambulatory referral to Pulmonology  4. Night sweats  R61   5. SOB (shortness of breath)  R06.02     Discussion:  This is a 75 year old female with a diffuse parenchymal abnormality which predominates the left lung.  She has had radiation to the left breast but this abnormalities were not seen on CT imaging back in October of this past year.  The areas are peribronchovascular with visible airways.  I think the differential diagnosis includes diseases such as organizing pneumonia, and acute parenchymal lung disease related to drug toxicity,  amyloidosis, atypical infections.  Unlikely that we are dealing with a malignancy.  Other things that concern me are her new onset of night sweats.  Plan: We discussed the next best steps in evaluation. We will proceed with bronchoscopy. She does have some small adenopathy within the mediastinum which I suspect is reactive. We will plan for endobronchial ultrasound transbronchial needle aspiration since will be there at that time. Additionally we will plan for transbronchial biopsies of the parenchymal abnormalities if it seen well on fluoroscopy we may not need to use navigation however we will plan to be able to access the space appropriately with navigational bronchoscopy if needed. Patient is agreeable to this plan. We discussed the risk benefits and alternatives of proceeding with navigational bronchoscopy, endobronchial ultrasound to help establish a diagnosis.  All images were reviewed with patient today in the office.    Current Outpatient Medications:  .  acetaminophen (TYLENOL) 500 MG tablet, Take 500 mg by mouth every 6 (six) hours as needed for headache., Disp: , Rfl:  .  denosumab (PROLIA) 60 MG/ML SOSY injection, Inject 60 mg into the skin every 6 (six) months.,  Disp: , Rfl:  .  esomeprazole (NEXIUM) 20 MG capsule, Take 20 mg by mouth daily as needed (acid reflux). , Disp: , Rfl:  .  PRESCRIPTION MEDICATION, Apply 1 application topically at bedtime. Cream provided by hospital for breast blisters after radiation, Disp: , Rfl:  .  Thiamine HCl (VITAMIN B-1 PO), Take 1 tablet by mouth daily., Disp: , Rfl:  .  Vitamin D, Ergocalciferol, (DRISDOL) 1.25 MG (50000 UNIT) CAPS capsule, TAKE 1 CAPSULE (50,000 UNITS TOTAL) BY MOUTH EVERY 7 (SEVEN) DAYS., Disp: 12 capsule, Rfl: 1  I spent 61 minutes dedicated to the care of this patient on the date of this encounter to include pre-visit review of records, face-to-face time with the patient discussing conditions above, post visit ordering of  testing, clinical documentation with the electronic health record, making appropriate referrals as documented, and communicating necessary findings to members of the patients care team.   Garner Nash, DO [facility] 12/18/2020 10:58 AM

## 2020-12-19 ENCOUNTER — Telehealth: Payer: Self-pay | Admitting: Pulmonary Disease

## 2020-12-19 NOTE — Telephone Encounter (Signed)
noted 

## 2020-12-19 NOTE — Telephone Encounter (Signed)
Pt has been scheduled for 2/11 at 7:30 at [facility].  She is to have covid test on 2/8.  I spoke to [place] at [facility] & she is going to have CT coverted to disk & get courier to pick up.  I spoke to pt & gave her appt info.

## 2020-12-23 DIAGNOSIS — D2262 Melanocytic nevi of left upper limb, including shoulder: Secondary | ICD-10-CM | POA: Diagnosis not present

## 2020-12-23 DIAGNOSIS — D1801 Hemangioma of skin and subcutaneous tissue: Secondary | ICD-10-CM | POA: Diagnosis not present

## 2020-12-23 DIAGNOSIS — L308 Other specified dermatitis: Secondary | ICD-10-CM | POA: Diagnosis not present

## 2020-12-23 DIAGNOSIS — L821 Other seborrheic keratosis: Secondary | ICD-10-CM | POA: Diagnosis not present

## 2020-12-23 DIAGNOSIS — D225 Melanocytic nevi of trunk: Secondary | ICD-10-CM | POA: Diagnosis not present

## 2020-12-24 ENCOUNTER — Other Ambulatory Visit (HOSPITAL_COMMUNITY)
Admission: RE | Admit: 2020-12-24 | Discharge: 2020-12-24 | Disposition: A | Discharge status: Home / Self Care | Payer: Medicare Other | Source: Ambulatory Visit | Attending: Pulmonary Disease | Admitting: Pulmonary Disease

## 2020-12-24 DIAGNOSIS — Z20822 Contact with and (suspected) exposure to covid-19: Secondary | ICD-10-CM | POA: Insufficient documentation

## 2020-12-24 DIAGNOSIS — Z01812 Encounter for preprocedural laboratory examination: Secondary | ICD-10-CM | POA: Diagnosis not present

## 2020-12-24 LAB — SARS CORONAVIRUS 2 (TAT 6-24 HRS): SARS Coronavirus 2: NEGATIVE

## 2020-12-26 ENCOUNTER — Encounter (HOSPITAL_COMMUNITY): Payer: Self-pay | Admitting: Pulmonary Disease

## 2020-12-26 NOTE — Progress Notes (Signed)
Spoke with pt for pre-op call. Pt denies cardiac history, HTN or Diabetes.   Covid test done 12/24/20 and it's negative.  Pt states she's been in quarantine since the test was done and understands that she stays in quarantine until she comes to the hospital in the morning.

## 2020-12-27 ENCOUNTER — Ambulatory Visit (HOSPITAL_COMMUNITY): Payer: Medicare Other

## 2020-12-27 ENCOUNTER — Encounter (HOSPITAL_COMMUNITY)
Admission: RE | Disposition: A | Discharge status: Home / Self Care | Payer: Self-pay | Source: Home / Self Care | Attending: Pulmonary Disease

## 2020-12-27 ENCOUNTER — Ambulatory Visit (HOSPITAL_COMMUNITY): Payer: Medicare Other | Admitting: Anesthesiology

## 2020-12-27 ENCOUNTER — Telehealth: Payer: Self-pay | Admitting: Pulmonary Disease

## 2020-12-27 ENCOUNTER — Ambulatory Visit (HOSPITAL_COMMUNITY)
Admission: RE | Admit: 2020-12-27 | Discharge: 2020-12-27 | Disposition: A | Discharge status: Home / Self Care | Payer: Medicare Other | Attending: Pulmonary Disease | Admitting: Pulmonary Disease

## 2020-12-27 DIAGNOSIS — Z79899 Other long term (current) drug therapy: Secondary | ICD-10-CM | POA: Diagnosis not present

## 2020-12-27 DIAGNOSIS — J181 Lobar pneumonia, unspecified organism: Secondary | ICD-10-CM | POA: Diagnosis not present

## 2020-12-27 DIAGNOSIS — R599 Enlarged lymph nodes, unspecified: Secondary | ICD-10-CM | POA: Insufficient documentation

## 2020-12-27 DIAGNOSIS — R9389 Abnormal findings on diagnostic imaging of other specified body structures: Secondary | ICD-10-CM | POA: Diagnosis not present

## 2020-12-27 DIAGNOSIS — Z803 Family history of malignant neoplasm of breast: Secondary | ICD-10-CM | POA: Insufficient documentation

## 2020-12-27 DIAGNOSIS — Z882 Allergy status to sulfonamides status: Secondary | ICD-10-CM | POA: Diagnosis not present

## 2020-12-27 DIAGNOSIS — R61 Generalized hyperhidrosis: Secondary | ICD-10-CM | POA: Insufficient documentation

## 2020-12-27 DIAGNOSIS — Z886 Allergy status to analgesic agent status: Secondary | ICD-10-CM | POA: Insufficient documentation

## 2020-12-27 DIAGNOSIS — R846 Abnormal cytological findings in specimens from respiratory organs and thorax: Secondary | ICD-10-CM | POA: Insufficient documentation

## 2020-12-27 DIAGNOSIS — Z87891 Personal history of nicotine dependence: Secondary | ICD-10-CM | POA: Diagnosis not present

## 2020-12-27 DIAGNOSIS — R918 Other nonspecific abnormal finding of lung field: Secondary | ICD-10-CM | POA: Diagnosis not present

## 2020-12-27 DIAGNOSIS — Z888 Allergy status to other drugs, medicaments and biological substances status: Secondary | ICD-10-CM | POA: Insufficient documentation

## 2020-12-27 DIAGNOSIS — R0602 Shortness of breath: Secondary | ICD-10-CM | POA: Insufficient documentation

## 2020-12-27 DIAGNOSIS — Z9889 Other specified postprocedural states: Secondary | ICD-10-CM

## 2020-12-27 DIAGNOSIS — Z853 Personal history of malignant neoplasm of breast: Secondary | ICD-10-CM | POA: Insufficient documentation

## 2020-12-27 HISTORY — PX: BRONCHIAL BIOPSY: SHX5109

## 2020-12-27 HISTORY — PX: VIDEO BRONCHOSCOPY WITH ENDOBRONCHIAL NAVIGATION: SHX6175

## 2020-12-27 HISTORY — PX: BRONCHIAL NEEDLE ASPIRATION BIOPSY: SHX5106

## 2020-12-27 HISTORY — PX: VIDEO BRONCHOSCOPY WITH ENDOBRONCHIAL ULTRASOUND: SHX6177

## 2020-12-27 HISTORY — PX: BRONCHIAL WASHINGS: SHX5105

## 2020-12-27 HISTORY — PX: BRONCHIAL BRUSHINGS: SHX5108

## 2020-12-27 LAB — BASIC METABOLIC PANEL
Anion gap: 9 (ref 5–15)
BUN: 16 mg/dL (ref 8–23)
CO2: 26 mmol/L (ref 22–32)
Calcium: 8.9 mg/dL (ref 8.9–10.3)
Chloride: 103 mmol/L (ref 98–111)
Creatinine, Ser: 0.92 mg/dL (ref 0.44–1.00)
GFR, Estimated: >60 mL/min (ref >60)
Glucose, Bld: 139 mg/dL — ABNORMAL HIGH (ref 70–99)
Potassium: 4.5 mmol/L (ref 3.5–5.1)
Sodium: 138 mmol/L (ref 135–145)

## 2020-12-27 LAB — BODY FLUID CELL COUNT WITH DIFFERENTIAL
Eos, Fluid: 3 %
Lymphs, Fluid: 53 %
Monocyte-Macrophage-Serous Fluid: 9 % — ABNORMAL LOW (ref 50–90)
Neutrophil Count, Fluid: 35 % — ABNORMAL HIGH (ref 0–25)
Total Nucleated Cell Count, Fluid: 63 cu mm (ref 0–1000)

## 2020-12-27 SURGERY — VIDEO BRONCHOSCOPY WITH ENDOBRONCHIAL NAVIGATION
Anesthesia: General | Laterality: Bilateral

## 2020-12-27 MED ORDER — FENTANYL CITRATE (PF) 100 MCG/2ML IJ SOLN
INTRAMUSCULAR | Status: DC | PRN
  Administered 2020-12-27: 100 ug via INTRAVENOUS

## 2020-12-27 MED ORDER — LIDOCAINE VISCOUS HCL 2 % MT SOLN
OROMUCOSAL | Status: AC
  Filled 2020-12-27: qty 15

## 2020-12-27 MED ORDER — FENTANYL CITRATE (PF) 100 MCG/2ML IJ SOLN: 25.0000 ug | INTRAMUSCULAR | Status: DC | PRN

## 2020-12-27 MED ORDER — CHLORHEXIDINE GLUCONATE 0.12 % MT SOLN
OROMUCOSAL | Status: AC
  Filled 2020-12-27: qty 15

## 2020-12-27 MED ORDER — LACTATED RINGERS IV SOLN: INTRAVENOUS | Status: DC

## 2020-12-27 MED ORDER — LIDOCAINE 2% (20 MG/ML) 5 ML SYRINGE
INTRAMUSCULAR | Status: DC | PRN
  Administered 2020-12-27: 60 mg via INTRAVENOUS

## 2020-12-27 MED ORDER — ONDANSETRON HCL 4 MG/2ML IJ SOLN
INTRAMUSCULAR | Status: DC | PRN
  Administered 2020-12-27: 4 mg via INTRAVENOUS

## 2020-12-27 MED ORDER — PHENYLEPHRINE 40 MCG/ML (10ML) SYRINGE FOR IV PUSH (FOR BLOOD PRESSURE SUPPORT)
PREFILLED_SYRINGE | INTRAVENOUS | Status: DC | PRN
  Administered 2020-12-27: 80 ug via INTRAVENOUS
  Administered 2020-12-27 (×2): 160 ug via INTRAVENOUS

## 2020-12-27 MED ORDER — ROCURONIUM BROMIDE 10 MG/ML (PF) SYRINGE
PREFILLED_SYRINGE | INTRAVENOUS | Status: DC | PRN
  Administered 2020-12-27: 30 mg via INTRAVENOUS

## 2020-12-27 MED ORDER — OXYCODONE HCL 5 MG/5ML PO SOLN: 5.0000 mg | Freq: Once | ORAL | Status: DC | PRN

## 2020-12-27 MED ORDER — EPHEDRINE SULFATE-NACL 50-0.9 MG/10ML-% IV SOSY
PREFILLED_SYRINGE | INTRAVENOUS | Status: DC | PRN
  Administered 2020-12-27: 5 mg via INTRAVENOUS

## 2020-12-27 MED ORDER — PROPOFOL 10 MG/ML IV BOLUS
INTRAVENOUS | Status: DC | PRN
  Administered 2020-12-27: 120 mg via INTRAVENOUS

## 2020-12-27 MED ORDER — OXYCODONE HCL 5 MG PO TABS: 5.0000 mg | ORAL_TABLET | Freq: Once | ORAL | Status: DC | PRN

## 2020-12-27 MED ORDER — ONDANSETRON HCL 4 MG/2ML IJ SOLN: 4.0000 mg | Freq: Once | INTRAMUSCULAR | Status: DC | PRN

## 2020-12-27 SURGICAL SUPPLY — 53 items
ADAPTER BRONCH F/PENTAX (ADAPTER) ×3 IMPLANT
ADAPTER VALVE BIOPSY EBUS (MISCELLANEOUS) IMPLANT
ADPR BSCP EDG PNTX (ADAPTER) ×2
ADPTR VALVE BIOPSY EBUS (MISCELLANEOUS)
BRUSH CYTOL CELLEBRITY 1.5X140 (MISCELLANEOUS) ×3 IMPLANT
BRUSH SUPERTRAX BIOPSY (INSTRUMENTS) IMPLANT
BRUSH SUPERTRAX NDL-TIP CYTO (INSTRUMENTS) ×3 IMPLANT
CANISTER SUCT 3000ML PPV (MISCELLANEOUS) ×3 IMPLANT
CHANNEL WORK EXTEND EDGE 180 (KITS) IMPLANT
CHANNEL WORK EXTEND EDGE 45 (KITS) IMPLANT
CHANNEL WORK EXTEND EDGE 90 (KITS) IMPLANT
CONT SPEC 4OZ CLIKSEAL STRL BL (MISCELLANEOUS) ×3 IMPLANT
COVER BACK TABLE 60X90IN (DRAPES) ×3 IMPLANT
COVER DOME SNAP 22 D (MISCELLANEOUS) ×3 IMPLANT
FILTER STRAW FLUID ASPIR (MISCELLANEOUS) IMPLANT
FORCEPS BIOP RJ4 1.8 (CUTTING FORCEPS) IMPLANT
FORCEPS BIOP SUPERTRX PREMAR (INSTRUMENTS) ×3 IMPLANT
GAUZE SPONGE 4X4 12PLY STRL (GAUZE/BANDAGES/DRESSINGS) ×3 IMPLANT
GLOVE BIO SURGEON STRL SZ7.5 (GLOVE) ×3 IMPLANT
GLOVE SURG SS PI 7.5 STRL IVOR (GLOVE) ×6 IMPLANT
GOWN STRL REUS W/ TWL LRG LVL3 (GOWN DISPOSABLE) ×4 IMPLANT
GOWN STRL REUS W/TWL LRG LVL3 (GOWN DISPOSABLE) ×6
KIT CLEAN ENDO COMPLIANCE (KITS) ×6 IMPLANT
KIT LOCATABLE GUIDE (CANNULA) IMPLANT
KIT MARKER FIDUCIAL DELIVERY (KITS) IMPLANT
KIT PROCEDURE EDGE 180 (KITS) IMPLANT
KIT PROCEDURE EDGE 45 (KITS) IMPLANT
KIT PROCEDURE EDGE 90 (KITS) IMPLANT
KIT TURNOVER KIT B (KITS) ×3 IMPLANT
MARKER SKIN DUAL TIP RULER LAB (MISCELLANEOUS) ×3 IMPLANT
NDL EBUS SONO TIP PENTAX (NEEDLE) ×2 IMPLANT
NDL SUPERTRX PREMARK BIOPSY (NEEDLE) ×2 IMPLANT
NEEDLE EBUS SONO TIP PENTAX (NEEDLE) ×3 IMPLANT
NEEDLE SUPERTRX PREMARK BIOPSY (NEEDLE) ×3 IMPLANT
NS IRRIG 1000ML POUR BTL (IV SOLUTION) ×3 IMPLANT
OIL SILICONE PENTAX (PARTS (SERVICE/REPAIRS)) ×3 IMPLANT
PAD ARMBOARD 7.5X6 YLW CONV (MISCELLANEOUS) ×6 IMPLANT
PATCHES PATIENT (LABEL) ×9 IMPLANT
SOL ANTI FOG 6CC (MISCELLANEOUS) ×2 IMPLANT
SOLUTION ANTI FOG 6CC (MISCELLANEOUS) ×1
SYR 20CC LL (SYRINGE) ×6 IMPLANT
SYR 20ML ECCENTRIC (SYRINGE) ×6 IMPLANT
SYR 50ML SLIP (SYRINGE) ×3 IMPLANT
SYR 5ML LUER SLIP (SYRINGE) ×3 IMPLANT
TOWEL OR 17X24 6PK STRL BLUE (TOWEL DISPOSABLE) ×3 IMPLANT
TRAP SPECIMEN MUCOUS 40CC (MISCELLANEOUS) IMPLANT
TUBE CONNECTING 20X1/4 (TUBING) ×6 IMPLANT
UNDERPAD 30X30 (UNDERPADS AND DIAPERS) ×3 IMPLANT
VALVE BIOPSY  SINGLE USE (MISCELLANEOUS) ×3
VALVE BIOPSY SINGLE USE (MISCELLANEOUS) ×2 IMPLANT
VALVE DISPOSABLE (MISCELLANEOUS) ×3 IMPLANT
VALVE SUCTION BRONCHIO DISP (MISCELLANEOUS) ×3 IMPLANT
WATER STERILE IRR 1000ML POUR (IV SOLUTION) ×3 IMPLANT

## 2020-12-27 NOTE — Discharge Instructions (Signed)
Flexible Bronchoscopy, Care After This sheet gives you information about how to care for yourself after your test. Your doctor may also give you more specific instructions. If you have problems or questions, contact your doctor. Follow these instructions at home: Eating and drinking  Do not eat or drink anything (not even water) for 2 hours after your test, or until your numbing medicine (local anesthetic) wears off.  When your numbness is gone and your cough and gag reflexes have come back, you may: ? Eat only soft foods. ? Slowly drink liquids.  The day after the test, go back to your normal diet. Driving  Do not drive for 24 hours if you were given a medicine to help you relax (sedative).  Do not drive or use heavy machinery while taking prescription pain medicine. General instructions   Take over-the-counter and prescription medicines only as told by your doctor.  Return to your normal activities as told. Ask what activities are safe for you.  Do not use any products that have nicotine or tobacco in them. This includes cigarettes and e-cigarettes. If you need help quitting, ask your doctor.  Keep all follow-up visits as told by your doctor. This is important. It is very important if you had a tissue sample (biopsy) taken. Get help right away if:  You have shortness of breath that gets worse.  You get light-headed.  You feel like you are going to pass out (faint).  You have chest pain.  You cough up: ? More than a little blood. ? More blood than before. Summary  Do not eat or drink anything (not even water) for 2 hours after your test, or until your numbing medicine wears off.  Do not use cigarettes. Do not use e-cigarettes.  Get help right away if you have chest pain.   This information is not intended to replace advice given to you by your health care provider. Make sure you discuss any questions you have with your health care provider. Document Released:  08/30/2009 Document Revised: 10/15/2017 Document Reviewed: 11/20/2016 Elsevier Patient Education  2020 [employer].

## 2020-12-27 NOTE — Op Note (Signed)
Video Bronchoscopy with Electromagnetic Navigation Procedure Note Video Bronchoscopy with Endobronchial Ultrasound Procedure Note  Date of Operation: 12/27/2020  Pre-op Diagnosis: Lung mass, consolidation, adenopathy  Post-op Diagnosis: Mass, consolidation, adenopathy  Surgeon: Garner Nash, DO   Assistants: none   Anesthesia: General endotracheal anesthesia  Operation: Flexible video fiberoptic bronchoscopy with electromagnetic navigation and biopsies.  Estimated Blood Loss: Minimal  Complications: None   Indications and History: Shelly [name] is a 75 y.o. female with history of breast cancer, lung mass, consolidation, adenopathy.  The risks, benefits, complications, treatment options and expected outcomes were discussed with the patient.  The possibilities of pneumothorax, pneumonia, reaction to medication, pulmonary aspiration, perforation of a viscus, bleeding, failure to diagnose a condition and creating a complication requiring transfusion or operation were discussed with the patient who freely signed the consent.    Endobronchial navigation description of Procedure: The patient was seen in the Preoperative Area, was examined and was deemed appropriate to proceed.  The patient was taken to [facility] endoscopy room 2, identified as Shelly [name] and the procedure verified as Flexible Video Fiberoptic Bronchoscopy.  A Time Out was held and the above information confirmed.   Target #1 left lower lobe consolidation: Prior to the date of the procedure a high-resolution CT scan of the chest was performed. Utilizing [place] a virtual tracheobronchial tree was generated to allow the creation of distinct navigation pathways to the patient's parenchymal abnormalities. After being taken to the operating room general anesthesia was initiated and the patient  was orally intubated. The video fiberoptic bronchoscope was introduced via the endotracheal tube and a general  inspection was performed which showed normal right and left lung anatomy with no evidence of endobronchial lesion. The extendable working channel and locator guide were introduced into the bronchoscope. The distinct navigation pathways prepared prior to this procedure were then utilized to navigate to within 1 cm of patient's lesion(s) identified on CT scan. The extendable working channel was secured into place and the locator guide was withdrawn. Under fluoroscopic guidance transbronchial needle brushings, transbronchial Wang needle biopsies, and transbronchial forceps biopsies were performed to be sent for cytology and pathology.  Additional transbronchial biopsies were sent for tissue culture. A bronchioalveolar lavage was performed in the left lower lobe and sent for cytology and microbiology (bacterial, fungal, AFB smears and cultures).   Endobronchial ultrasound description of Procedure: Target #2 station 7 lymph node: The standard scope was then withdrawn and the endobronchial ultrasound was used to identify and characterize the peritracheal, hilar and bronchial lymph nodes. Inspection showed no significant hilar adenopathy bilaterally, there was 3 small matted nodes in the subcarinal station 7 location. Using real-time ultrasound guidance Wang needle biopsies were take from Station 7 nodes and were sent for cytology.    At the end of the procedure a general airway inspection was performed, the therapeutic scope was used for aspiration of the bilateral mainstem's and removal of blood clots and any remaining debris.  At the termination of the procedure all distal subsegments were patent and there was no evidence of active bleeding. The bronchoscope was removed.  The patient tolerated the procedure well. There was no significant blood loss and there were no obvious complications. A post-procedural chest x-ray is pending.  Target #1 left lower lobe consolidation samples: 1. Transbronchial needle  brushings from left lower lobe 2. Transbronchial Wang needle biopsies from left lower lobe 3. Transbronchial forceps biopsies from left lower lobe 4. Bronchoalveolar lavage from left lower lobe  #  2 subcarinal, station 7, samples: 1. Wang needle biopsies from 7 node  Plans:  The patient will be discharged from the PACU to home when recovered from anesthesia and after chest x-ray is reviewed. We will review the cytology, pathology and microbiology results with the patient when they become available. Outpatient followup will be with Garner Nash, DO.   Garner Nash, DO [facility] 12/27/2020 8:55 AM

## 2020-12-27 NOTE — Telephone Encounter (Signed)
ENB this am .  Advised will take few days to get culture/cytology  Etc back  If not heard from [place] by 5 days give call back for results  Doing ok post procedure   Route to [facility] for [facility]

## 2020-12-27 NOTE — Anesthesia Preprocedure Evaluation (Signed)
Anesthesia Evaluation  Patient identified by MRN, date of birth, ID band Patient awake    Reviewed: Allergy & Precautions, NPO status , Patient's Chart, lab work & pertinent test results  Airway Mallampati: II  TM Distance: >3 FB Neck ROM: Full    Dental  (+) Teeth Intact, Dental Advisory Given   Pulmonary former smoker,    breath sounds clear to auscultation       Cardiovascular  Rhythm:Regular Rate:Normal     Neuro/Psych    GI/Hepatic   Endo/Other    Renal/GU      Musculoskeletal   Abdominal   Peds  Hematology   Anesthesia Other Findings   Reproductive/Obstetrics                             Anesthesia Physical Anesthesia Plan  ASA: III  Anesthesia Plan: General   Post-op Pain Management:    Induction: Intravenous  PONV Risk Score and Plan: Ondansetron and Dexamethasone  Airway Management Planned: Oral ETT  Additional Equipment:   Intra-op Plan:   Post-operative Plan: Extubation in OR  Informed Consent: I have reviewed the patients History and Physical, chart, labs and discussed the procedure including the risks, benefits and alternatives for the proposed anesthesia with the patient or authorized representative who has indicated his/her understanding and acceptance.   Dental advisory given  Plan Discussed with: CRNA and Anesthesiologist  Anesthesia Plan Comments:         Anesthesia Quick Evaluation  

## 2020-12-27 NOTE — Telephone Encounter (Signed)
I called and spoke with her and her husband.  [facility] 12/27/2020 4:15 PM

## 2020-12-27 NOTE — Transfer of Care (Signed)
Immediate Anesthesia Transfer of Care Note  Patient: Shelly [name]  Procedure(s) Performed: VIDEO BRONCHOSCOPY WITH ENDOBRONCHIAL NAVIGATION (Bilateral ) VIDEO BRONCHOSCOPY WITH ENDOBRONCHIAL ULTRASOUND (Bilateral ) BRONCHIAL BRUSHINGS BRONCHIAL NEEDLE ASPIRATION BIOPSIES BRONCHIAL WASHINGS BRONCHIAL BIOPSIES  Patient Location: PACU  Anesthesia Type:General  Level of Consciousness: awake and alert   Airway & Oxygen Therapy: Patient Spontanous Breathing and Patient connected to face mask oxygen  Post-op Assessment: Report given to RN and Post -op Vital signs reviewed and stable  Post vital signs: Reviewed and stable  Last Vitals:  Vitals Value Taken Time  BP 170/73 12/27/20 0900  Temp 36.7 C 12/27/20 0900  Pulse 90 12/27/20 0902  Resp 25 12/27/20 0902  SpO2 88 % 12/27/20 0902  Vitals shown include unvalidated device data.  Last Pain:  Vitals:   12/27/20 0619  PainSc: 0-No pain         Complications: No complications documented.

## 2020-12-27 NOTE — Interval H&P Note (Signed)
History and Physical Interval Note:  12/27/2020 7:08 AM  Shelly [name]  has presented today for surgery, with the diagnosis of PULMONARY INFILTRATE.  The various methods of treatment have been discussed with the patient and family. After consideration of risks, benefits and other options for treatment, the patient has consented to  Procedure(s): [place] (Bilateral) VIDEO BRONCHOSCOPY WITH ENDOBRONCHIAL ULTRASOUND (Bilateral) as a surgical intervention.  The patient's history has been reviewed, patient examined, no change in status, stable for surgery.  I have reviewed the patient's chart and labs.  Questions were answered to the patient's satisfaction.     [place]

## 2020-12-27 NOTE — Anesthesia Postprocedure Evaluation (Signed)
Anesthesia Post Note  Patient: Shelly [name]  Procedure(s) Performed: VIDEO BRONCHOSCOPY WITH ENDOBRONCHIAL NAVIGATION (Bilateral ) VIDEO BRONCHOSCOPY WITH ENDOBRONCHIAL ULTRASOUND (Bilateral ) BRONCHIAL BRUSHINGS BRONCHIAL NEEDLE ASPIRATION BIOPSIES BRONCHIAL WASHINGS BRONCHIAL BIOPSIES     Patient location during evaluation: PACU Anesthesia Type: General Level of consciousness: awake and alert Pain management: pain level controlled Vital Signs Assessment: post-procedure vital signs reviewed and stable Respiratory status: spontaneous breathing, nonlabored ventilation, respiratory function stable and patient connected to nasal cannula oxygen Cardiovascular status: blood pressure returned to baseline and stable Postop Assessment: no apparent nausea or vomiting Anesthetic complications: no   No complications documented.  Last Vitals:  Vitals:   12/27/20 1000 12/27/20 1015  BP: (!) 152/61 (!) 120/51  Pulse: 84 85  Resp: 13 13  Temp:  36.7 C  SpO2: 91% 97%    Last Pain:  Vitals:   12/27/20 1015  PainSc: 0-No pain                 Shelly [name]

## 2020-12-27 NOTE — Anesthesia Procedure Notes (Signed)
Procedure Name: Intubation Date/Time: 12/27/2020 7:36 AM Performed by: Hoy Morn, CRNA Pre-anesthesia Checklist: Patient identified, Emergency Drugs available, Suction available and Patient being monitored Patient Re-evaluated:Patient Re-evaluated prior to induction Oxygen Delivery Method: Circle system utilized Preoxygenation: Pre-oxygenation with 100% oxygen Induction Type: IV induction Ventilation: Mask ventilation without difficulty Laryngoscope Size: Miller and 2 Grade View: Grade I Tube type: Oral Number of attempts: 1 Airway Equipment and Method: Stylet and Oral airway Placement Confirmation: ETT inserted through vocal cords under direct vision,  positive ETCO2 and breath sounds checked- equal and bilateral Secured at: 22 cm Tube secured with: Tape Dental Injury: Teeth and Oropharynx as per pre-operative assessment

## 2020-12-29 LAB — ACID FAST SMEAR (AFB, MYCOBACTERIA)
Acid Fast Smear: NEGATIVE
Acid Fast Smear: NEGATIVE

## 2020-12-30 ENCOUNTER — Encounter (HOSPITAL_COMMUNITY): Payer: Self-pay | Admitting: Pulmonary Disease

## 2020-12-30 ENCOUNTER — Encounter (HOSPITAL_COMMUNITY): Payer: Medicare Other

## 2020-12-30 ENCOUNTER — Encounter: Payer: Medicare Other | Admitting: Physical Therapy

## 2020-12-31 LAB — CYTOLOGY - NON PAP

## 2021-01-01 LAB — AEROBIC/ANAEROBIC CULTURE W GRAM STAIN (SURGICAL/DEEP WOUND): Culture: NO GROWTH

## 2021-01-01 LAB — ASPERGILLUS ANTIGEN, BAL/SERUM: Aspergillus Ag, BAL/Serum: 0.04 Index (ref 0.00–0.49)

## 2021-01-02 LAB — AEROBIC/ANAEROBIC CULTURE W GRAM STAIN (SURGICAL/DEEP WOUND)

## 2021-01-06 ENCOUNTER — Encounter: Payer: Medicare Other | Admitting: Physical Therapy

## 2021-01-07 ENCOUNTER — Telehealth: Payer: Self-pay | Admitting: Family Medicine

## 2021-01-07 NOTE — Telephone Encounter (Signed)
Patient is calling and wanted to speak to someone regarding her prolia injection, please advise. CB is [phone]

## 2021-01-07 NOTE — Telephone Encounter (Signed)
Insurance verification started. Prolia injection not due until 02/03/21

## 2021-01-08 NOTE — Telephone Encounter (Signed)
Insurance verified. Patient scheduled for prolia injection 02/03/21

## 2021-01-10 ENCOUNTER — Other Ambulatory Visit: Payer: Self-pay

## 2021-01-10 DIAGNOSIS — C50112 Malignant neoplasm of central portion of left female breast: Secondary | ICD-10-CM

## 2021-01-10 DIAGNOSIS — Z170 Estrogen receptor positive status [ER+]: Secondary | ICD-10-CM

## 2021-01-10 NOTE — Progress Notes (Signed)
Shelly [name]   [phone] [phone]   Clinic Follow up Note   Patient Care Team: Eulas Post, MD as PCP - General (Family Medicine) Barbaraann Cao, [place] as Referring Physician (Optometry) Jovita Kussmaul, MD as Consulting Physician (General Surgery) Mauro Kaufmann, RN as Oncology Nurse Navigator Rockwell Germany, RN as Oncology Nurse Navigator Truitt Merle, MD as Consulting Physician (Hematology) Alla Feeling, NP as Nurse Practitioner (Nurse Practitioner) Eppie Gibson, MD as Attending Physician (Radiation Oncology)  Date of Service:  01/13/2021  CHIEF COMPLAINT: F/u of left breast cancer and review CT chest   SUMMARY OF ONCOLOGIC HISTORY: Oncology History Overview Note  Cancer Staging Cancer of central portion of left female breast [facility]) Staging form: Breast, AJCC 8th Edition - Clinical stage from 09/18/2019: Stage IA (cT1b, cN0, cM0, G2, ER+, PR+, HER2-) - Signed by Truitt Merle, MD on 09/18/2019 - Pathologic stage from 10/06/2019: Stage IA (pT1c, pN1a, cM0, G2, ER+, PR+, HER2-) - Signed by Truitt Merle, MD on 10/19/2019    Cancer of central portion of left female breast ([place])  08/24/2019 Mammogram   Diagnostic mammogram 08/24/19  IMPRESSION: 1. Left breast mass measuring 0.8 x 0.4 x 0.5 cm at the 1 o'clock retroareolar region is suspicious and likely corresponds to the area of distortion seen on mammography.   2. Left breast 3 o'clock retroareolar dilated duct with internal debris versus solid mass.   3. Right breast retroareolar focal asymmetry without sonographic correlate.   08/28/2019 Initial Biopsy   Final Diagnosis 08/28/19 1. Breast, left, needle core biopsy, 1 o'clock/retroareolar - INVASIVE DUCTAL CARCINOMA, GRADE II. - PERINEURAL INVASION. - SEE MICROSCOPIC DESCRIPTION. 2. Breast, left, needle core biopsy, 3 o'clock retroareolar - DUCTAL PAPILLOMA. - SEE MICROSCOPIC DESCRIPTION.   08/28/2019 Receptors her2   1.  PROGNOSTIC INDICATORS 08/28/19 Results: IMMUNOHISTOCHEMICAL AND MORPHOMETRIC ANALYSIS PERFORMED MANUALLY The tumor cells are NEGATIVE for Her2 (0). Estrogen Receptor: 100%, POSITIVE, STRONG STAINING INTENSITY Progesterone Receptor: 50%, POSITIVE, STRONG STAINING INTENSITY Proliferation Marker Ki67: 12%   09/01/2019 Pathology Results   Diagnosis 09/01/19 Breast, right, needle core biopsy, retroareolar - COMPLEX SCLEROSING LESION WITH A PAPILLARY SCLEROSING LESION AND USUAL DUCTAL HYPERPLASIA. SEE NOTE - NEGATIVE FOR CARCINOMA   09/18/2019 Initial Diagnosis   Cancer of central portion of left female breast ([place])   09/18/2019 Cancer Staging   Staging form: Breast, AJCC 8th Edition - Clinical stage from 09/18/2019: Stage IA (cT1b, cN0, cM0, G2, ER+, PR+, HER2-) - Signed by Truitt Merle, MD on 09/18/2019   10/06/2019 Surgery   LEFT BREAST LUMPECTOMY  X2 WITH RADIOACTIVE SEED X2  AND SENTINEL LYMPH NODE BIOPSY and  RIGHT BREAST LUMPECTOMY WITH RADIOACTIVE SEED LOCALIZATION by Dr Marlou Starks  10/06/19    10/06/2019 Pathology Results   FINAL MICROSCOPIC DIAGNOSIS: 10/06/19   A. BREAST, RIGHT, LUMPECTOMY:  - Fibrocystic change with a small incidental intraductal papilloma and  usual ductal hyperplasia  - Biopsy site changes  - Negative for carcinoma   B. LYMPH NODE, LEFT #1, SENTINEL, BIOPSY:  - Metastatic carcinoma to a lymph node (1/1)  - Focus of metastatic carcinoma measures 2.5 mm and shows extranodal  extension   C. LYMPH NODE, LEFT #2, SENTINEL, BIOPSY:  - Lymph node, negative for carcinoma (0/1)   D. LYMPH NODE, LEFT #3, SENTINEL, BIOPSY:  - Lymph node, negative for carcinoma (0/1)   E. BREAST, LEFT, LUMPECTOMY:  - Invasive ductal carcinoma, grade 2, 1.8 cm  - Ductal carcinoma in situ, intermediate  grade  - Invasive carcinoma focally involves the anterior margin  - DCIS is less than 1 mm from the superior margin  - Background breast parenchyma with fibrocystic change, usual  ductal  hyperplasia and intraductal papilloma.  - Biopsy site changes  - See oncology table    10/06/2019 Miscellaneous   Her Mammaprint showed High Risk Luminal Type B, risk of recurrence in the next 10 years is 29% in high risk disease. MPI -0.335    10/06/2019 Cancer Staging   Staging form: Breast, AJCC 8th Edition - Pathologic stage from 10/06/2019: Stage IA (pT1c, pN1a, cM0, G2, ER+, PR+, HER2-) - Signed by Truitt Merle, MD on 10/19/2019   11/06/2019 Surgery   EXCISION NIPPLE AND AREOLA  MARGIN LEFT BREAST by Dr Marlou Starks  11/06/19    11/06/2019 Pathology Results    FINAL MICROSCOPIC DIAGNOSIS: 11/06/19  A. BREAST, LEFT CENTRAL, RE-EXCISION:  -  Residual ductal carcinoma in situ, intermediate grade  -  Margins uninvolved by carcinoma (0.4 cm; superior margin)  -  Atypical intraductal papilloma  -  Usual ductal hyperplasia  -  Benign skin with seborrheic keratosis  -  Previous surgical site changes  -  No invasive carcinoma identified    12/06/2019 - 12/20/2019 Chemotherapy   Adjuvant chemo TC q3weeks for 4 cycles starting 12/06/19. With first TC infusion she had allergy reaction to docetaxel and [name] was not given on 12/06/19, we discontinued.   -I started her on weekly Abraxane on 12/20/19, but 2 days after infusion she started to feel tachycardic, hot/flushed, and lightheaded. A few days later she had dysuria, diarrhea and abdominal cramps and intermittent chest pain. She opted to d/c chemo and proceed with RT.    01/25/2020 - 03/06/2020 Radiation Therapy   Adjuvant Radiation with Dr Isidore Moos 01/25/20-03/06/20   05/2020 - 07/2020 Anti-estrogen oral therapy   I started her on antiestrogen therapy with Tamoxifen in 03/2020. She took until 06/06/20 and 46m 06/04/20- 06/2020 but stopped due to exacerbated IBS symptoms. She also tried 2 weeks of Exemestane in 07/2020 but stopped due to increased Anxiety.   06/06/2020 Survivorship   Care plan delivered by [name] NP    12/04/2020 Imaging   CT  chest  IMPRESSION: 1. Marked interval worsening of parenchymal disease now involving areas of the LEFT lower lobe, lingula, RIGHT upper lobe and with nodular opacity in the RIGHT lower lobe. Findings could be seen in the setting of COVID-19 infection. Pattern of disease also raises the question of organizing pneumonia. Pneumonitis, drug related changes are also considered. Pattern of disease would be atypical for neoplasm. Pulmonary consultation is suggested if not yet performed. Continued short interval surveillance is also suggested. 2. Post LEFT breast lumpectomy and axillary dissection. 3. Calcified coronary artery disease in LEFT and RIGHT coronary circulation. 4. Stable LEFT adrenal nodule compatible with benign adenoma. 5. Aortic atherosclerosis.   Aortic Atherosclerosis (ICD10-I70.0).      CURRENT THERAPY:  Surveillance   INTERVAL HISTORY:  PSheril HammondElder is here for a follow up. She presents to the clinic with husband. She notes she recovered from bronchoscopy. She plans to see Dr IValeta Harmsnext week. I reviewed her medication list with her. She notes bulging veins that are itching. She plans to see specialist for evaluation. She notes she does have multivitamin, but stopped due to acid reflux. She notes she is not active with exercise but able to lose 5 pounds with change in diet.    REVIEW OF SYSTEMS:  Constitutional: Denies fevers, chills or abnormal weight loss Eyes: Denies blurriness of vision Ears, nose, mouth, throat, and face: Denies mucositis or sore throat Respiratory: Denies cough, dyspnea or wheezes Cardiovascular: Denies palpitation, chest discomfort or lower extremity swelling Gastrointestinal:  Denies nausea, heartburn or change in bowel habits Skin: Denies abnormal skin rashes Lymphatics: Denies new lymphadenopathy or easy bruising Neurological:Denies numbness, tingling or new weaknesses Behavioral/Psych: Mood is stable, no new changes  All other  systems were reviewed with the patient and are negative.  MEDICAL HISTORY:  Past Medical History:  Diagnosis Date  . Arthritis    wrist, shoulder   . Cancer ([place]) 08/2019   left breast IDC  . Diverticulitis   . Endometrial polyp   . Family history of breast cancer   . GERD (gastroesophageal reflux disease)    occasional - diet controlled  . Hx of adenomatous colonic polyps   . Hyperlipidemia   . IBS (irritable bowel syndrome)   . Missed abortion    x 1 - resolved - no surgery  . Osteoporosis   . SVD (spontaneous vaginal delivery)    x 1  . Transient vision disturbance 2019   was evaluated by ED, no findings  . Vitamin D deficiency     SURGICAL HISTORY: Past Surgical History:  Procedure Laterality Date  . BREAST EXCISIONAL BIOPSY Right 08/2019   Benign, lesion   . BREAST LUMPECTOMY Left 09/2019  . BREAST LUMPECTOMY WITH RADIOACTIVE SEED AND SENTINEL LYMPH NODE BIOPSY Left 10/06/2019   Procedure: LEFT BREAST LUMPECTOMY  X2 WITH RADIOACTIVE SEED X2  AND SENTINEL LYMPH NODE BIOPSY;  Surgeon: Jovita Kussmaul, MD;  Location: [facility];  Service: General;  Laterality: Left;  . BREAST LUMPECTOMY WITH RADIOACTIVE SEED LOCALIZATION Right 10/06/2019   Procedure: RIGHT BREAST LUMPECTOMY WITH RADIOACTIVE SEED LOCALIZATION;  Surgeon: Jovita Kussmaul, MD;  Location: [facility];  Service: General;  Laterality: Right;  . BRONCHIAL BIOPSY  12/27/2020   Procedure: BRONCHIAL BIOPSIES;  Surgeon: Garner Nash, DO;  Location: [facility];  Service: Pulmonary;;  . BRONCHIAL BRUSHINGS  12/27/2020   Procedure: BRONCHIAL BRUSHINGS;  Surgeon: Garner Nash, DO;  Location: [facility];  Service: Pulmonary;;  . BRONCHIAL NEEDLE ASPIRATION BIOPSY  12/27/2020   Procedure: BRONCHIAL NEEDLE ASPIRATION BIOPSIES;  Surgeon: Garner Nash, DO;  Location: [facility];  Service: Pulmonary;;  . BRONCHIAL WASHINGS  12/27/2020   Procedure: BRONCHIAL WASHINGS;  Surgeon: Garner Nash, DO;  Location: [facility];  Service: Pulmonary;;  . COLONOSCOPY    . DIAGNOSTIC LAPAROSCOPY     cysts  . DILATATION & CURETTAGE/HYSTEROSCOPY WITH TRUECLEAR N/A 10/31/2013   Procedure: DILATATION & CURETTAGE/HYSTEROSCOPY WITH TRUCLEAR;  Surgeon: Marylynn Pearson, MD;  Location: [facility];  Service: Gynecology;  Laterality: N/A;  . DILATION AND CURETTAGE OF UTERUS    . MASS EXCISION Left 11/06/2019   Procedure: EXCISION NIPPLE AND AREOLA  MARGIN LEFT BREAST;  Surgeon: Jovita Kussmaul, MD;  Location: [facility];  Service: General;  Laterality: Left;  . TONSILLECTOMY  1954  . VIDEO BRONCHOSCOPY WITH ENDOBRONCHIAL NAVIGATION Bilateral 12/27/2020   Procedure: VIDEO BRONCHOSCOPY WITH ENDOBRONCHIAL NAVIGATION;  Surgeon: Garner Nash, DO;  Location: [facility];  Service: Pulmonary;  Laterality: Bilateral;  . VIDEO BRONCHOSCOPY WITH ENDOBRONCHIAL ULTRASOUND Bilateral 12/27/2020   Procedure: VIDEO BRONCHOSCOPY WITH ENDOBRONCHIAL ULTRASOUND;  Surgeon: Garner Nash, DO;  Location: [facility];  Service: Pulmonary;  Laterality: Bilateral;  . WISDOM TOOTH EXTRACTION  I have reviewed the social history and family history with the patient and they are unchanged from previous note.  ALLERGIES:  is allergic to taxotere [docetaxel], abraxane [paclitaxel protein-bound part], crestor [rosuvastatin calcium], lipitor [atorvastatin calcium], nsaids, pravastatin, and sulfonamide derivatives.  MEDICATIONS:  Current Outpatient Medications  Medication Sig Dispense Refill  . acetaminophen (TYLENOL) 500 MG tablet Take 500 mg by mouth every 6 (six) hours as needed for headache or mild pain.    . clidinium-chlordiazePOXIDE (LIBRAX) 5-2.5 MG capsule Take 1 capsule by mouth daily as needed (IBS).    Marland Kitchen denosumab (PROLIA) 60 MG/ML SOSY injection Inject 60 mg into the skin every 6 (six) months.    . esomeprazole (NEXIUM) 20 MG capsule Take 20 mg by mouth daily as needed (acid reflux).     Marland Kitchen  loratadine (CLARITIN) 10 MG tablet Take 10 mg by mouth daily.    Vladimir Faster Glycol-Propyl Glycol (LUBRICANT [name] DROPS) 0.4-0.3 % SOLN Place 1 drop into both eyes 3 (three) times daily as needed (dry/irritated eyes).    . Vitamin D, Ergocalciferol, (DRISDOL) 1.25 MG (50000 UNIT) CAPS capsule TAKE 1 CAPSULE (50,000 UNITS TOTAL) BY MOUTH EVERY 7 (SEVEN) DAYS. (Patient taking differently: Take 50,000 Units by mouth every 21 ( twenty-one) days.) 12 capsule 1   No current facility-administered medications for this visit.    PHYSICAL EXAMINATION: ECOG PERFORMANCE STATUS: 0 - Asymptomatic  Vitals:   01/13/21 1329  BP: (!) 157/73  Pulse: 82  Resp: 14  Temp: (!) 97.4 F (36.3 C)  SpO2: 95%   Filed Weights   01/13/21 1329  Weight: 174 lb 6.4 oz (79.1 kg)    GENERAL:alert, no distress and comfortable SKIN: skin color, texture, turgor are normal, no rashes or significant lesions (+) Multiple skin moles of her chest and breast EYES: normal, Conjunctiva are pink and non-injected, sclera clear  NECK: supple, thyroid normal size, non-tender, without nodularity LYMPH:  no palpable lymphadenopathy in the cervical, axillary  LUNGS: clear to auscultation and percussion with normal breathing effort HEART: regular rate & rhythm and no murmurs and no lower extremity edema ABDOMEN:abdomen soft, non-tender and normal bowel sounds Musculoskeletal:no cyanosis of digits and no clubbing  NEURO: alert & oriented x 3 with fluent speech, no focal motor/sensory deficits BREAST: S/P b/l lumpectomy: surgical incision healed well with mild scar tissue, tender. No palpable mass, nodules or adenopathy bilaterally. Breast exam benign.   LABORATORY DATA:  I have reviewed the data as listed CBC Latest Ref Rng & Units 01/13/2021 12/04/2020 09/03/2020  WBC 4.0 - 10.5 K/uL 7.7 8.5 5.8  Hemoglobin 12.0 - 15.0 g/dL 10.8(L) 11.5(L) 13.4  Hematocrit 36.0 - 46.0 % 34.8(L) 36.8 40.8  Platelets 150 - 400 K/uL 381 445(H) 265      CMP Latest Ref Rng & Units 01/13/2021 12/27/2020 12/04/2020  Glucose 70 - 99 mg/dL 158(H) 139(H) 107(H)  BUN 8 - 23 mg/dL _0 Creatinine 0.44 - 1.00 mg/dL 0.93 0.92 0.88  Sodium 135 - 145 mmol/L 139 138 138  Potassium 3.5 - 5.1 mmol/L 4.3 4.5 4.3  Chloride 98 - 111 mmol/L 106 103 102  CO2 22 - 32 mmol/L _1 Calcium 8.9 - 10.3 mg/dL 9.1 8.9 9.1  Total Protein 6.5 - 8.1 g/dL 6.7 - 7.5  Total Bilirubin 0.3 - 1.2 mg/dL <0.2(L) - 0.3  Alkaline Phos 38 - 126 U/L 79 - 75  AST 15 - 41 U/L 9(L) - 10(L)  ALT 0 - 44 U/L  8 - 10      RADIOGRAPHIC STUDIES: I have personally reviewed the radiological images as listed and agreed with the findings in the report. No results found.   ASSESSMENT & PLAN:  Vaidehi Braddy is a 75 y.o. female with    1. Multifocal infiltrates in bilateral lungs  -Her 09/03/20 CT chestshowed 2.2 x 1.8 cm area of mixed density in the left upper lobe with surroundingarea ofgroundglass changes.This could berelated to her previous left breast radiationor inflammatory changes, althoughmalignancy is not ruled out but less likely. She is asymptomatic,no clinical concern for infection. -Her CT chest from 12/04/20 shows Marked interval worsening of parenchymal disease now involving areas of the LEFT lower lobe, lingula, RIGHT upper lobe and with nodular opacity in the RIGHT lower lobe. Findings could be seen in the setting of COVID-19 infection or PNA, or interstitial disease  -She notes she was exposed to [place] by son-in-law in 10/2020. She tested negative around that time but felt bad for about 2 weeks.  -She was seen by Dr Valeta Harms and underwent bronchoscopy on 12/27/20. Cytology (from brush, lavage and FNA of LLL and station 7 node) Results was negative for malignancy except LLL FNA showed atypical cells. I discussed atypical cells is not definitive, likely benign although malignancy can not be completely ruled out.  I reviewed with patient today. -She will  f/u with Dr Valeta Harms and monitor lung lesions with repeat scan in March or April 2022.    2.Cancer of central portion of left female breast, StageIA, c(T1bN0M0), ER/PR+, HER2-, GradeII, pT1cN1aM0, stage IA -She was diagnosed in 08/2019. She underwent B/l breast lumpectomy and SLNB with Dr. Marlou Starks on 10/06/19.Due to positive margins and 1/3 positive LNs she underwent re-excisional surgery on 11/06/19 which showed DCIS only, margins negative.She has high risk for recurrence based on Mammaprint. -She tried adjuvant chemo with TC and Abraxane but could not tolerate either. She did complete adjuvant RTwith Dr Isidore Moos 01/25/20-03/06/20. -She did not want to pursue Genetic testing  -She took antiestrogen therapy (Tamoxifen Exemestane) over 2 years 05/2020-07/2020 and stopped due to poor toleration.  -From a breast cancer standpoint, she is clinically doing well. Labs reviewed, HG 10.8. Physical exam benign.  -Continue surveillance. F/u in 4 months    3. Osteoporosis, Vitamin D deficiency  -Diagnosed prior to 2007 in [place].Last DEXA in 06/2015 (-3.2 at [facility]). -On Prolia injections every 6 months since 2017-2018. Was restarted on 01/30/20 in our clinic.  -Continue high dose Vit D and Tums (for calcium)   4. Diarrhea and Acid Reflux  -Her BM are controlled onLibrax and Nexiumand imodium. No recent Diarrhea  5. Anxiety,insomnia -She is nervous about her cancer diagnosis andchemo. -She has had episodes of panic attack.She did have trouble sleeping, etiology likely multifactorial.  -She is also on Ativan. She can use benadryl and Melatonin to help her sleep if needed. -She had increased anxiety on Exemestane. I recommend starting SSRI with Zoloft for 2 weeks. If tolerable will try restarting Exemestane. She is agreeable. -She notes being depressed lately and feels anxious about her health declining.    6. H/o smoking, Emphysema, abnormal CT chest in 01/2020 and 09/03/20 -She  has h/o smoking.  -given abnormal CT chests, latest on 12/04/20, will consult with Pulmonologist.    7. Anemia, new -Due to recent anemia (11.5 on 12/04/20) and 10.8 today (01/13/21), will do anemia workup today. She is agreeable.  -Her last colonoscopy was in 08/2014. I recommend she repeat colonoscopy. She is  agreeable.  -will call her with lab results     PLAN: -Refer her to [facility] for colonoscopy. She is overdue  -Lab in 2 months for anemia f/u  -Lab and F/u in 4 months  -copy Dr. Valeta Harms, will defer to Dr. Valeta Harms for her f/u CT chest     No problem-specific Assessment & Plan notes found for this encounter.   Orders Placed This Encounter  Procedures  . Retic Panel    Standing Status:   Future    Number of Occurrences:   1    Standing Expiration Date:   01/13/2022  . Ferritin    Standing Status:   Future    Number of Occurrences:   1    Standing Expiration Date:   01/13/2022  . Iron and TIBC    Standing Status:   Future    Number of Occurrences:   1    Standing Expiration Date:   01/13/2022  . Folate RBC    Standing Status:   Future    Number of Occurrences:   1    Standing Expiration Date:   01/13/2022  . Methylmalonic acid, serum    Standing Status:   Future    Number of Occurrences:   1    Standing Expiration Date:   01/13/2022  . Ambulatory referral to Gastroenterology    Referral Priority:   Routine    Referral Type:   Consultation    Referral Reason:   Specialty Services Required    Number of Visits Requested:   1   All questions were answered. The patient knows to call the clinic with any problems, questions or concerns. No barriers to learning was detected. The total time spent in the appointment was 30 minutes.     Truitt Merle, MD 01/13/2021   I, Joslyn Devon, am acting as scribe for Truitt Merle, MD.   I have reviewed the above documentation for accuracy and completeness, and I agree with the above.

## 2021-01-13 ENCOUNTER — Encounter: Payer: Medicare Other | Admitting: Physical Therapy

## 2021-01-13 ENCOUNTER — Encounter: Payer: Self-pay | Admitting: Hematology

## 2021-01-13 ENCOUNTER — Inpatient Hospital Stay: Payer: Medicare Other

## 2021-01-13 ENCOUNTER — Other Ambulatory Visit: Payer: Self-pay

## 2021-01-13 ENCOUNTER — Inpatient Hospital Stay: Payer: Medicare Other | Attending: Hematology

## 2021-01-13 ENCOUNTER — Inpatient Hospital Stay (HOSPITAL_BASED_OUTPATIENT_CLINIC_OR_DEPARTMENT_OTHER): Payer: Medicare Other | Admitting: Hematology

## 2021-01-13 VITALS — BP 157/73 | HR 82 | Temp 97.4°F | Resp 14 | Ht 64.0 in | Wt 174.4 lb

## 2021-01-13 DIAGNOSIS — D649 Anemia, unspecified: Secondary | ICD-10-CM

## 2021-01-13 DIAGNOSIS — Z170 Estrogen receptor positive status [ER+]: Secondary | ICD-10-CM | POA: Insufficient documentation

## 2021-01-13 DIAGNOSIS — F419 Anxiety disorder, unspecified: Secondary | ICD-10-CM | POA: Insufficient documentation

## 2021-01-13 DIAGNOSIS — C50112 Malignant neoplasm of central portion of left female breast: Secondary | ICD-10-CM

## 2021-01-13 DIAGNOSIS — J439 Emphysema, unspecified: Secondary | ICD-10-CM | POA: Insufficient documentation

## 2021-01-13 DIAGNOSIS — E559 Vitamin D deficiency, unspecified: Secondary | ICD-10-CM | POA: Insufficient documentation

## 2021-01-13 DIAGNOSIS — G4700 Insomnia, unspecified: Secondary | ICD-10-CM | POA: Insufficient documentation

## 2021-01-13 DIAGNOSIS — M810 Age-related osteoporosis without current pathological fracture: Secondary | ICD-10-CM | POA: Insufficient documentation

## 2021-01-13 DIAGNOSIS — K219 Gastro-esophageal reflux disease without esophagitis: Secondary | ICD-10-CM | POA: Insufficient documentation

## 2021-01-13 DIAGNOSIS — K580 Irritable bowel syndrome with diarrhea: Secondary | ICD-10-CM | POA: Diagnosis not present

## 2021-01-13 LAB — CMP (CANCER CENTER ONLY)
ALT: 8 U/L (ref 0–44)
AST: 9 U/L — ABNORMAL LOW (ref 15–41)
Albumin: 3 g/dL — ABNORMAL LOW (ref 3.5–5.0)
Alkaline Phosphatase: 79 U/L (ref 38–126)
Anion gap: 7 (ref 5–15)
BUN: 17 mg/dL (ref 8–23)
CO2: 26 mmol/L (ref 22–32)
Calcium: 9.1 mg/dL (ref 8.9–10.3)
Chloride: 106 mmol/L (ref 98–111)
Creatinine: 0.93 mg/dL (ref 0.44–1.00)
GFR, Estimated: >60 mL/min (ref >60)
Glucose, Bld: 158 mg/dL — ABNORMAL HIGH (ref 70–99)
Potassium: 4.3 mmol/L (ref 3.5–5.1)
Sodium: 139 mmol/L (ref 135–145)
Total Bilirubin: <0.2 mg/dL — ABNORMAL LOW (ref 0.3–1.2)
Total Protein: 6.7 g/dL (ref 6.5–8.1)

## 2021-01-13 LAB — IRON AND TIBC
Iron: 24 ug/dL — ABNORMAL LOW (ref 41–142)
Saturation Ratios: 9 % — ABNORMAL LOW (ref 21–57)
TIBC: 275 ug/dL (ref 236–444)
UIBC: 251 ug/dL (ref 120–384)

## 2021-01-13 LAB — CBC WITH DIFFERENTIAL (CANCER CENTER ONLY)
Abs Immature Granulocytes: 0.02 10*3/uL (ref 0.00–0.07)
Basophils Absolute: 0 10*3/uL (ref 0.0–0.1)
Basophils Relative: 1 %
Eosinophils Absolute: 0.2 10*3/uL (ref 0.0–0.5)
Eosinophils Relative: 2 %
HCT: 34.8 % — ABNORMAL LOW (ref 36.0–46.0)
Hemoglobin: 10.8 g/dL — ABNORMAL LOW (ref 12.0–15.0)
Immature Granulocytes: 0 %
Lymphocytes Relative: 20 %
Lymphs Abs: 1.5 10*3/uL (ref 0.7–4.0)
MCH: 26 pg (ref 26.0–34.0)
MCHC: 31 g/dL (ref 30.0–36.0)
MCV: 83.9 fL (ref 80.0–100.0)
Monocytes Absolute: 0.5 10*3/uL (ref 0.1–1.0)
Monocytes Relative: 7 %
Neutro Abs: 5.4 10*3/uL (ref 1.7–7.7)
Neutrophils Relative %: 70 %
Platelet Count: 381 10*3/uL (ref 150–400)
RBC: 4.15 MIL/uL (ref 3.87–5.11)
RDW: 15.3 % (ref 11.5–15.5)
WBC Count: 7.7 10*3/uL (ref 4.0–10.5)
nRBC: 0 % (ref 0.0–0.2)

## 2021-01-13 LAB — RETIC PANEL
Immature Retic Fract: 14.8 % (ref 2.3–15.9)
RBC.: 4.27 MIL/uL (ref 3.87–5.11)
Retic Count, Absolute: 62.8 10*3/uL (ref 19.0–186.0)
Retic Ct Pct: 1.5 % (ref 0.4–3.1)
Reticulocyte Hemoglobin: 28.4 pg (ref >27.9)

## 2021-01-13 LAB — FERRITIN: Ferritin: 199 ng/mL (ref 11–307)

## 2021-01-14 ENCOUNTER — Telehealth: Payer: Self-pay | Admitting: Hematology

## 2021-01-14 LAB — FOLATE RBC
Folate, Hemolysate: 339 ng/mL
Folate, RBC: 934 ng/mL (ref >498)
Hematocrit: 36.3 % (ref 34.0–46.6)

## 2021-01-14 NOTE — Telephone Encounter (Signed)
Scheduled appointment per 02/28 los. Contacted patient, patient is aware.

## 2021-01-16 LAB — METHYLMALONIC ACID, SERUM: Methylmalonic Acid, Quantitative: 332 nmol/L (ref 0–378)

## 2021-01-17 LAB — CULTURE, FUNGUS WITHOUT SMEAR

## 2021-01-20 ENCOUNTER — Ambulatory Visit (INDEPENDENT_AMBULATORY_CARE_PROVIDER_SITE_OTHER): Payer: Medicare Other | Admitting: Pulmonary Disease

## 2021-01-20 ENCOUNTER — Other Ambulatory Visit: Payer: Self-pay

## 2021-01-20 ENCOUNTER — Encounter: Payer: Self-pay | Admitting: Pulmonary Disease

## 2021-01-20 VITALS — BP 140/80 | HR 82 | Temp 97.8°F | Ht 65.0 in | Wt 174.6 lb

## 2021-01-20 DIAGNOSIS — R918 Other nonspecific abnormal finding of lung field: Secondary | ICD-10-CM

## 2021-01-20 DIAGNOSIS — Z853 Personal history of malignant neoplasm of breast: Secondary | ICD-10-CM

## 2021-01-20 DIAGNOSIS — R9389 Abnormal findings on diagnostic imaging of other specified body structures: Secondary | ICD-10-CM | POA: Diagnosis not present

## 2021-01-20 DIAGNOSIS — R0602 Shortness of breath: Secondary | ICD-10-CM | POA: Diagnosis not present

## 2021-01-20 NOTE — Patient Instructions (Addendum)
Thank you for visiting Dr. Valeta Harms at [facility]. Today we recommend the following: Orders Placed This Encounter  Procedures  . CT Chest Wo Contrast   Ct scan in 3 months.   Return in about 3 months (around 04/22/2021) for with APP or Dr. Valeta Harms.    Please do your part to reduce the spread of COVID-19.

## 2021-01-20 NOTE — Progress Notes (Signed)
Synopsis: Referred in February 2022 for abnormal CT chest, history of breast cancer, PCP by Eulas Post, MD  Subjective:   PATIENT ID: Shelly [name] GENDER: female DOB: Jan 25, 1946, MRN: [id number]  Chief Complaint  Patient presents with  . Follow-up    SOB improved, cough at night     This is a 75 year old female, past medical history of arthritis, IBS, hyperlipidemia, osteoporosis, vitamin D deficiency.  Followed by Dr. Annamaria Boots from medical oncology patient was diagnosed in November 2020 with a stage Ia (cT1b, N0, M0, G2, ER positive, PR positive, HER-2 negative, breast cancer patient underwent lumpectomy and sentinel node biopsy, patient completed adjuvant chemotherapy and radiation with Dr. Isidore Moos..  Patient Covid vaccine plus booster.  She felt sick after a Covid exposure but at the time she states she tested negative.  She had a follow-up CT image from oncology on 12/04/2020 which revealed bilateral consolidations and infiltrates.  Recommending close follow-up.  Patient was referred for evaluation by pulmonary regarding abnormality seen on CT imaging.  OV 12/18/2020: Here today for follow-up and evaluation of abnormal CT imaging.  She does complain today of dyspnea on exertion, shortness of breath.  It is usually worse in the morning.  She denies hemoptysis.  She does have night sweats that occur 3-4 times a week.  All of which are new over the past couple of months.  OV 01/20/2021: Here today for follow-up after recent bronchoscopy.  Patient was taken for navigational bronchoscopy on 12/27/2020.  BAL results revealed 53% lymphocytes 3% eos.  Culture results from bronchoscopy had polymicrobial normal flora, fungal cultures to date no growth.  AFB smear negative.  Biopsies/cytology of station 7 and transbronchial biopsies were negative except for left lower lobe fine-needle aspirate with atypical cells present.  The patient has subsequently followed up with medical oncology on 01/13/2021,  Dr. Burr Medico, office note reviewed today.     Oncology History Overview Note  Cancer Staging Cancer of central portion of left female breast [facility]) Staging form: Breast, AJCC 8th Edition - Clinical stage from 09/18/2019: Stage IA (cT1b, cN0, cM0, G2, ER+, PR+, HER2-) - Signed by Truitt Merle, MD on 09/18/2019 - Pathologic stage from 10/06/2019: Stage IA (pT1c, pN1a, cM0, G2, ER+, PR+, HER2-) - Signed by Truitt Merle, MD on 10/19/2019    Cancer of central portion of left female breast ([place])  08/24/2019 Mammogram   Diagnostic mammogram 08/24/19  IMPRESSION: 1. Left breast mass measuring 0.8 x 0.4 x 0.5 cm at the 1 o'clock retroareolar region is suspicious and likely corresponds to the area of distortion seen on mammography.   2. Left breast 3 o'clock retroareolar dilated duct with internal debris versus solid mass.   3. Right breast retroareolar focal asymmetry without sonographic correlate.   08/28/2019 Initial Biopsy   Final Diagnosis 08/28/19 1. Breast, left, needle core biopsy, 1 o'clock/retroareolar - INVASIVE DUCTAL CARCINOMA, GRADE II. - PERINEURAL INVASION. - SEE MICROSCOPIC DESCRIPTION. 2. Breast, left, needle core biopsy, 3 o'clock retroareolar - DUCTAL PAPILLOMA. - SEE MICROSCOPIC DESCRIPTION.   08/28/2019 Receptors her2   1. PROGNOSTIC INDICATORS 08/28/19 Results: IMMUNOHISTOCHEMICAL AND MORPHOMETRIC ANALYSIS PERFORMED MANUALLY The tumor cells are NEGATIVE for Her2 (0). Estrogen Receptor: 100%, POSITIVE, STRONG STAINING INTENSITY Progesterone Receptor: 50%, POSITIVE, STRONG STAINING INTENSITY Proliferation Marker Ki67: 12%   09/01/2019 Pathology Results   Diagnosis 09/01/19 Breast, right, needle core biopsy, retroareolar - COMPLEX SCLEROSING LESION WITH A PAPILLARY SCLEROSING LESION AND USUAL DUCTAL HYPERPLASIA. SEE NOTE - NEGATIVE FOR CARCINOMA  09/18/2019 Initial Diagnosis   Cancer of central portion of left female breast ([place])   09/18/2019 Cancer Staging   Staging  form: Breast, AJCC 8th Edition - Clinical stage from 09/18/2019: Stage IA (cT1b, cN0, cM0, G2, ER+, PR+, HER2-) - Signed by Truitt Merle, MD on 09/18/2019   10/06/2019 Surgery   LEFT BREAST LUMPECTOMY  X2 WITH RADIOACTIVE SEED X2  AND SENTINEL LYMPH NODE BIOPSY and  RIGHT BREAST LUMPECTOMY WITH RADIOACTIVE SEED LOCALIZATION by Dr Marlou Starks  10/06/19    10/06/2019 Pathology Results   FINAL MICROSCOPIC DIAGNOSIS: 10/06/19   A. BREAST, RIGHT, LUMPECTOMY:  - Fibrocystic change with a small incidental intraductal papilloma and  usual ductal hyperplasia  - Biopsy site changes  - Negative for carcinoma   B. LYMPH NODE, LEFT #1, SENTINEL, BIOPSY:  - Metastatic carcinoma to a lymph node (1/1)  - Focus of metastatic carcinoma measures 2.5 mm and shows extranodal  extension   C. LYMPH NODE, LEFT #2, SENTINEL, BIOPSY:  - Lymph node, negative for carcinoma (0/1)   D. LYMPH NODE, LEFT #3, SENTINEL, BIOPSY:  - Lymph node, negative for carcinoma (0/1)   E. BREAST, LEFT, LUMPECTOMY:  - Invasive ductal carcinoma, grade 2, 1.8 cm  - Ductal carcinoma in situ, intermediate grade  - Invasive carcinoma focally involves the anterior margin  - DCIS is less than 1 mm from the superior margin  - Background breast parenchyma with fibrocystic change, usual ductal  hyperplasia and intraductal papilloma.  - Biopsy site changes  - See oncology table    10/06/2019 Miscellaneous   Her Mammaprint showed High Risk Luminal Type B, risk of recurrence in the next 10 years is 29% in high risk disease. MPI -0.335    10/06/2019 Cancer Staging   Staging form: Breast, AJCC 8th Edition - Pathologic stage from 10/06/2019: Stage IA (pT1c, pN1a, cM0, G2, ER+, PR+, HER2-) - Signed by Truitt Merle, MD on 10/19/2019   11/06/2019 Surgery   EXCISION NIPPLE AND AREOLA  MARGIN LEFT BREAST by Dr Marlou Starks  11/06/19    11/06/2019 Pathology Results    FINAL MICROSCOPIC DIAGNOSIS: 11/06/19  A. BREAST, LEFT CENTRAL, RE-EXCISION:  -   Residual ductal carcinoma in situ, intermediate grade  -  Margins uninvolved by carcinoma (0.4 cm; superior margin)  -  Atypical intraductal papilloma  -  Usual ductal hyperplasia  -  Benign skin with seborrheic keratosis  -  Previous surgical site changes  -  No invasive carcinoma identified    12/06/2019 - 12/20/2019 Chemotherapy   Adjuvant chemo TC q3weeks for 4 cycles starting 12/06/19. With first TC infusion she had allergy reaction to docetaxel and [name] was not given on 12/06/19, we discontinued.   -I started her on weekly Abraxane on 12/20/19, but 2 days after infusion she started to feel tachycardic, hot/flushed, and lightheaded. A few days later she had dysuria, diarrhea and abdominal cramps and intermittent chest pain. She opted to d/c chemo and proceed with RT.    01/25/2020 - 03/06/2020 Radiation Therapy   Adjuvant Radiation with Dr Isidore Moos 01/25/20-03/06/20   05/2020 - 07/2020 Anti-estrogen oral therapy   I started her on antiestrogen therapy with Tamoxifen in 03/2020. She took until 06/06/20 and 30m 06/04/20- 06/2020 but stopped due to exacerbated IBS symptoms. She also tried 2 weeks of Exemestane in 07/2020 but stopped due to increased Anxiety.   06/06/2020 Survivorship   Care plan delivered by [name] NP    12/04/2020 Imaging   CT chest  IMPRESSION: 1. Marked  interval worsening of parenchymal disease now involving areas of the LEFT lower lobe, lingula, RIGHT upper lobe and with nodular opacity in the RIGHT lower lobe. Findings could be seen in the setting of COVID-19 infection. Pattern of disease also raises the question of organizing pneumonia. Pneumonitis, drug related changes are also considered. Pattern of disease would be atypical for neoplasm. Pulmonary consultation is suggested if not yet performed. Continued short interval surveillance is also suggested. 2. Post LEFT breast lumpectomy and axillary dissection. 3. Calcified coronary artery disease in LEFT and RIGHT  coronary circulation. 4. Stable LEFT adrenal nodule compatible with benign adenoma. 5. Aortic atherosclerosis.   Aortic Atherosclerosis (ICD10-I70.0).      Past Medical History:  Diagnosis Date  . Arthritis    wrist, shoulder   . Cancer ([place]) 08/2019   left breast IDC  . Diverticulitis   . Endometrial polyp   . Family history of breast cancer   . GERD (gastroesophageal reflux disease)    occasional - diet controlled  . Hx of adenomatous colonic polyps   . Hyperlipidemia   . IBS (irritable bowel syndrome)   . Missed abortion    x 1 - resolved - no surgery  . Osteoporosis   . SVD (spontaneous vaginal delivery)    x 1  . Transient vision disturbance 2019   was evaluated by ED, no findings  . Vitamin D deficiency      Family History  Problem Relation Age of Onset  . Hyperlipidemia Mother   . Hypertension Mother   . Diabetes Mother        type ll  . Leukemia Mother 43  . Lung cancer Father   . Hyperlipidemia Father   . Breast cancer Cousin 30       maternal first couin  . Heart disease Other   . Hypertension Other   . Cancer Other   . Depression Brother   . Breast cancer Maternal Aunt 86  . Uterine cancer Paternal Grandmother   . Breast cancer Maternal Aunt 35  . Lung cancer Maternal Grandfather   . Cancer Maternal Grandfather        gastric cancer?  . Cancer Maternal Uncle        unknown  . Brain cancer Paternal Uncle   . Throat cancer Paternal Uncle      Past Surgical History:  Procedure Laterality Date  . BREAST EXCISIONAL BIOPSY Right 08/2019   Benign, lesion   . BREAST LUMPECTOMY Left 09/2019  . BREAST LUMPECTOMY WITH RADIOACTIVE SEED AND SENTINEL LYMPH NODE BIOPSY Left 10/06/2019   Procedure: LEFT BREAST LUMPECTOMY  X2 WITH RADIOACTIVE SEED X2  AND SENTINEL LYMPH NODE BIOPSY;  Surgeon: Jovita Kussmaul, MD;  Location: [place];  Service: General;  Laterality: Left;  . BREAST LUMPECTOMY WITH RADIOACTIVE SEED LOCALIZATION Right  10/06/2019   Procedure: RIGHT BREAST LUMPECTOMY WITH RADIOACTIVE SEED LOCALIZATION;  Surgeon: Jovita Kussmaul, MD;  Location: [place];  Service: General;  Laterality: Right;  . BRONCHIAL BIOPSY  12/27/2020   Procedure: BRONCHIAL BIOPSIES;  Surgeon: Garner Nash, DO;  Location: [place] [facility];  Service: Pulmonary;;  . BRONCHIAL BRUSHINGS  12/27/2020   Procedure: BRONCHIAL BRUSHINGS;  Surgeon: Garner Nash, DO;  Location: [place];  Service: Pulmonary;;  . BRONCHIAL NEEDLE ASPIRATION BIOPSY  12/27/2020   Procedure: BRONCHIAL NEEDLE ASPIRATION BIOPSIES;  Surgeon: Garner Nash, DO;  Location: [place];  Service: Pulmonary;;  . BRONCHIAL WASHINGS  12/27/2020   Procedure:  BRONCHIAL WASHINGS;  Surgeon: Garner Nash, DO;  Location: [place] [facility];  Service: Pulmonary;;  . COLONOSCOPY    . DIAGNOSTIC LAPAROSCOPY     cysts  . DILATATION & CURETTAGE/HYSTEROSCOPY WITH TRUECLEAR N/A 10/31/2013   Procedure: DILATATION & CURETTAGE/HYSTEROSCOPY WITH TRUCLEAR;  Surgeon: Marylynn Pearson, MD;  Location: [place] [facility];  Service: Gynecology;  Laterality: N/A;  . DILATION AND CURETTAGE OF UTERUS    . MASS EXCISION Left 11/06/2019   Procedure: EXCISION NIPPLE AND AREOLA  MARGIN LEFT BREAST;  Surgeon: Jovita Kussmaul, MD;  Location: [place];  Service: General;  Laterality: Left;  . TONSILLECTOMY  1952  . VIDEO BRONCHOSCOPY WITH ENDOBRONCHIAL NAVIGATION Bilateral 12/27/2020   Procedure: VIDEO BRONCHOSCOPY WITH ENDOBRONCHIAL NAVIGATION;  Surgeon: Garner Nash, DO;  Location: [place];  Service: Pulmonary;  Laterality: Bilateral;  . VIDEO BRONCHOSCOPY WITH ENDOBRONCHIAL ULTRASOUND Bilateral 12/27/2020   Procedure: VIDEO BRONCHOSCOPY WITH ENDOBRONCHIAL ULTRASOUND;  Surgeon: Garner Nash, DO;  Location: [place];  Service: Pulmonary;  Laterality: Bilateral;  . WISDOM TOOTH EXTRACTION      Social History   Socioeconomic History  . Marital status: Married     Spouse name: Not on file  . Number of children: 1  . Years of education: Not on file  . Highest education level: Not on file  Occupational History  . Not on file  Tobacco Use  . Smoking status: Former Smoker    Packs/day: 0.10    Years: 20.00    Pack years: 2.00    Types: Cigarettes    Quit date: 05/15/2007    Years since quitting: 13.6  . Smokeless tobacco: Never Used  Vaping Use  . Vaping Use: Never used  Substance and Sexual Activity  . Alcohol use: Yes    Alcohol/week: 14.0 standard drinks    Types: 14 Glasses of wine per week    Comment: 2 glasses wine every evening  . Drug use: No  . Sexual activity: Yes    Birth control/protection: Post-menopausal  Other Topics Concern  . Not on file  Social History Narrative   Lives with husband in 2 story house   Has one daughter, with 3 grandchildren, local, supportive   Enjoys gardening/walking outside with husband   Occasionally does home exercise calisthenics but not recently.   Well-balanced diet overall   Social Determinants of Health   Financial Resource Strain: Not on file  Food Insecurity: Not on file  Transportation Needs: Not on file  Physical Activity: Not on file  Stress: Not on file  Social Connections: Not on file  Intimate Partner Violence: Not on file     Allergies  Allergen Reactions  . Taxotere [Docetaxel] Swelling  . Abraxane [Paclitaxel Protein-Bound Part] Other (See Comments)    3 days after receiving she had one episode of very high BP and HR. This happened again 2 days later during the night.   Marland Kitchen Crestor [Rosuvastatin Calcium] Itching    GI upset  . Lipitor [Atorvastatin Calcium] Itching  . Nsaids Other (See Comments)    GI upset  . Pravastatin Itching    GI upset  . Sulfonamide Derivatives Rash    Hives, itiching     Outpatient Medications Prior to Visit  Medication Sig Dispense Refill  . acetaminophen (TYLENOL) 500 MG tablet Take 500 mg by mouth every 6 (six) hours as needed for headache  or mild pain.    . clidinium-chlordiazePOXIDE (LIBRAX) 5-2.5 MG capsule Take 1 capsule by mouth daily as  needed (IBS).    Marland Kitchen denosumab (PROLIA) 60 MG/ML SOSY injection Inject 60 mg into the skin every 6 (six) months.    . esomeprazole (NEXIUM) 20 MG capsule Take 20 mg by mouth daily as needed (acid reflux).     Marland Kitchen loratadine (CLARITIN) 10 MG tablet Take 10 mg by mouth daily.    Vladimir Faster Glycol-Propyl Glycol (LUBRICANT EYE DROPS) 0.4-0.3 % SOLN Place 1 drop into both eyes 3 (three) times daily as needed (dry/irritated eyes).    . Vitamin D, Ergocalciferol, (DRISDOL) 1.25 MG (50000 UNIT) CAPS capsule TAKE 1 CAPSULE (50,000 UNITS TOTAL) BY MOUTH EVERY 7 (SEVEN) DAYS. (Patient taking differently: Take 50,000 Units by mouth every 21 ( twenty-one) days.) 12 capsule 1   No facility-administered medications prior to visit.    Review of Systems  Constitutional: Negative for chills, fever, malaise/fatigue and weight loss.  HENT: Negative for hearing loss, sore throat and tinnitus.   Eyes: Negative for blurred vision and double vision.  Respiratory: Positive for shortness of breath. Negative for cough, hemoptysis, sputum production, wheezing and stridor.   Cardiovascular: Negative for chest pain, palpitations, orthopnea, leg swelling and PND.  Gastrointestinal: Negative for abdominal pain, constipation, diarrhea, heartburn, nausea and vomiting.  Genitourinary: Negative for dysuria, hematuria and urgency.  Musculoskeletal: Negative for joint pain and myalgias.  Skin: Negative for itching and rash.  Neurological: Negative for dizziness, tingling, weakness and headaches.  Endo/Heme/Allergies: Negative for environmental allergies. Does not bruise/bleed easily.  Psychiatric/Behavioral: Negative for depression. The patient is not nervous/anxious and does not have insomnia.   All other systems reviewed and are negative.    Objective:  Physical Exam Vitals reviewed.  Constitutional:      General: She  is not in acute distress.    Appearance: She is well-developed and well-nourished.  HENT:     Head: Normocephalic and atraumatic.     Mouth/Throat:     Mouth: Oropharynx is clear and moist.  Eyes:     General: No scleral icterus.    Conjunctiva/sclera: Conjunctivae normal.     Pupils: Pupils are equal, round, and reactive to light.  Neck:     Vascular: No JVD.     Trachea: No tracheal deviation.  Cardiovascular:     Rate and Rhythm: Normal rate and regular rhythm.     Pulses: Intact distal pulses.     Heart sounds: Normal heart sounds. No murmur heard.   Pulmonary:     Effort: Pulmonary effort is normal. No tachypnea, accessory muscle usage or respiratory distress.     Breath sounds: Normal breath sounds. No stridor. No wheezing, rhonchi or rales.  Abdominal:     General: Bowel sounds are normal. There is no distension.     Palpations: Abdomen is soft.     Tenderness: There is no abdominal tenderness.  Musculoskeletal:        General: No tenderness or edema.     Cervical back: Neck supple.  Lymphadenopathy:     Cervical: No cervical adenopathy.  Skin:    General: Skin is warm and dry.     Capillary Refill: Capillary refill takes less than 2 seconds.     Findings: No rash.  Neurological:     Mental Status: She is alert and oriented to person, place, and time.  Psychiatric:        Mood and Affect: Mood and affect normal.        Behavior: Behavior normal.     Vitals:   01/20/21 0953  BP: 140/80  Pulse: 82  Temp: 97.8 F (36.6 C)  TempSrc: Temporal  SpO2: 98%  Weight: 174 lb 9.6 oz (79.2 kg)  Height: _0  (1.651 m)   98% on  RA BMI Readings from Last 3 Encounters:  01/20/21 29.05 kg/m  01/13/21 29.94 kg/m  12/27/20 29.35 kg/m   Wt Readings from Last 3 Encounters:  01/20/21 174 lb 9.6 oz (79.2 kg)  01/13/21 174 lb 6.4 oz (79.1 kg)  12/27/20 171 lb (77.6 kg)     CBC    Component Value Date/Time   WBC 7.7 01/13/2021 1303   WBC 5.0 02/12/2020 1230    RBC 4.27 01/13/2021 1356   RBC 4.15 01/13/2021 1303   HGB 10.8 (L) 01/13/2021 1303   HCT 36.3 01/13/2021 1355   HCT 34.8 (L) 01/13/2021 1303   PLT 381 01/13/2021 1303   MCV 83.9 01/13/2021 1303   MCH 26.0 01/13/2021 1303   MCHC 31.0 01/13/2021 1303   RDW 15.3 01/13/2021 1303   LYMPHSABS 1.5 01/13/2021 1303   MONOABS 0.5 01/13/2021 1303   EOSABS 0.2 01/13/2021 1303   BASOSABS 0.0 01/13/2021 1303     Chest Imaging: CT chest 12/04/2020: Dense parenchymal abnormalities within the periphery of the left lower lobe as well as right upper lobe.  Airways adjacent concerning for inflammatory process. The patient's images have been independently reviewed by me.    Pulmonary Functions Testing Results: No flowsheet data found.  FeNO:   Pathology:   12/27/2020: Bronchoscopy cytology from lavage negative, left lower lobe fine-needle aspirate with "atypical cells"  Echocardiogram:   Heart Catheterization:     Assessment & Plan:     ICD-10-CM   1. Pulmonary infiltrate present on computed tomography  R91.8 CT Chest Wo Contrast  2. Abnormal CT of the chest  R93.89 CT Chest Wo Contrast  3. History of breast cancer  Z85.3 CT Chest Wo Contrast  4. SOB (shortness of breath)  R06.02 CT Chest Wo Contrast    Discussion:  This is a 75 year old female, diffuse parenchymal infiltrates, nonresolving pneumonia.  Presumed inflammatory infiltrates.  Unclear etiology at this time.  I still believe we may be dealing with a organizing type pneumonia.  She is getting better from a symptom standpoint.  All of her pathology results were negative.  There was atypical cells on an FNA of the left lower lobe.  Plan: We will recommend close follow-up.  Repeat noncontrasted CT scan of the chest in 3 months. Patient to follow-up with me after repeat CT. She is to let us know if she has any change in symptoms.  Things to watch out for include hemoptysis fevers chills night sweats and weight loss.  Patient is  agreeable to this plan.    Current Outpatient Medications:  .  acetaminophen (TYLENOL) 500 MG tablet, Take 500 mg by mouth every 6 (six) hours as needed for headache or mild pain., Disp: , Rfl:  .  clidinium-chlordiazePOXIDE (LIBRAX) 5-2.5 MG capsule, Take 1 capsule by mouth daily as needed (IBS)., Disp: , Rfl:  .  denosumab (PROLIA) 60 MG/ML SOSY injection, Inject 60 mg into the skin every 6 (six) months., Disp: , Rfl:  .  esomeprazole (NEXIUM) 20 MG capsule, Take 20 mg by mouth daily as needed (acid reflux). , Disp: , Rfl:  .  loratadine (CLARITIN) 10 MG tablet, Take 10 mg by mouth daily., Disp: , Rfl:  .  Polyethyl Glycol-Propyl Glycol (LUBRICANT EYE DROPS) 0.4-0.3 % SOLN, Place 1 drop into both  eyes 3 (three) times daily as needed (dry/irritated eyes)., Disp: , Rfl:  .  Vitamin D, Ergocalciferol, (DRISDOL) 1.25 MG (50000 UNIT) CAPS capsule, TAKE 1 CAPSULE (50,000 UNITS TOTAL) BY MOUTH EVERY 7 (SEVEN) DAYS. (Patient taking differently: Take 50,000 Units by mouth every 21 ( twenty-one) days.), Disp: 12 capsule, Rfl: 1   Garner Nash, DO [facility] 01/20/2021 10:03 AM

## 2021-01-24 ENCOUNTER — Encounter: Payer: Self-pay | Admitting: Gastroenterology

## 2021-01-27 DIAGNOSIS — I8311 Varicose veins of right lower extremity with inflammation: Secondary | ICD-10-CM | POA: Diagnosis not present

## 2021-01-27 DIAGNOSIS — I87393 Chronic venous hypertension (idiopathic) with other complications of bilateral lower extremity: Secondary | ICD-10-CM | POA: Diagnosis not present

## 2021-01-27 DIAGNOSIS — I8312 Varicose veins of left lower extremity with inflammation: Secondary | ICD-10-CM | POA: Diagnosis not present

## 2021-01-27 DIAGNOSIS — I83813 Varicose veins of bilateral lower extremities with pain: Secondary | ICD-10-CM | POA: Diagnosis not present

## 2021-01-28 DIAGNOSIS — I8311 Varicose veins of right lower extremity with inflammation: Secondary | ICD-10-CM | POA: Diagnosis not present

## 2021-01-28 DIAGNOSIS — I83813 Varicose veins of bilateral lower extremities with pain: Secondary | ICD-10-CM | POA: Diagnosis not present

## 2021-01-28 DIAGNOSIS — I87393 Chronic venous hypertension (idiopathic) with other complications of bilateral lower extremity: Secondary | ICD-10-CM | POA: Diagnosis not present

## 2021-01-28 DIAGNOSIS — I8312 Varicose veins of left lower extremity with inflammation: Secondary | ICD-10-CM | POA: Diagnosis not present

## 2021-01-29 LAB — FUNGUS CULTURE RESULT

## 2021-01-29 LAB — FUNGUS CULTURE WITH STAIN

## 2021-01-29 LAB — FUNGAL ORGANISM REFLEX

## 2021-01-31 ENCOUNTER — Other Ambulatory Visit: Payer: Self-pay

## 2021-02-03 ENCOUNTER — Other Ambulatory Visit: Payer: Self-pay

## 2021-02-03 ENCOUNTER — Ambulatory Visit (INDEPENDENT_AMBULATORY_CARE_PROVIDER_SITE_OTHER): Payer: Medicare Other

## 2021-02-03 DIAGNOSIS — M818 Other osteoporosis without current pathological fracture: Secondary | ICD-10-CM

## 2021-02-03 MED ORDER — DENOSUMAB 60 MG/ML ~~LOC~~ SOSY
60.0000 mg | PREFILLED_SYRINGE | Freq: Once | SUBCUTANEOUS | Status: AC
  Administered 2021-02-03: 60 mg via SUBCUTANEOUS

## 2021-02-03 NOTE — Progress Notes (Signed)
Pt came in for her prolia injection. Injection given in the right arm, pt tolerated well.

## 2021-02-07 DIAGNOSIS — I83813 Varicose veins of bilateral lower extremities with pain: Secondary | ICD-10-CM | POA: Diagnosis not present

## 2021-02-07 DIAGNOSIS — I87393 Chronic venous hypertension (idiopathic) with other complications of bilateral lower extremity: Secondary | ICD-10-CM | POA: Diagnosis not present

## 2021-02-10 LAB — ACID FAST CULTURE WITH REFLEXED SENSITIVITIES (MYCOBACTERIA)
Acid Fast Culture: NEGATIVE
Acid Fast Culture: NEGATIVE

## 2021-02-18 DIAGNOSIS — Z170 Estrogen receptor positive status [ER+]: Secondary | ICD-10-CM | POA: Diagnosis not present

## 2021-02-18 DIAGNOSIS — C50412 Malignant neoplasm of upper-outer quadrant of left female breast: Secondary | ICD-10-CM | POA: Diagnosis not present

## 2021-02-21 DIAGNOSIS — Z23 Encounter for immunization: Secondary | ICD-10-CM | POA: Diagnosis not present

## 2021-02-24 ENCOUNTER — Encounter (HOSPITAL_COMMUNITY): Payer: Medicare Other

## 2021-02-25 ENCOUNTER — Ambulatory Visit (AMBULATORY_SURGERY_CENTER): Payer: Self-pay

## 2021-02-25 ENCOUNTER — Other Ambulatory Visit: Payer: Self-pay

## 2021-02-25 VITALS — Ht 65.0 in | Wt 173.4 lb

## 2021-02-25 DIAGNOSIS — Z86010 Personal history of colonic polyps: Secondary | ICD-10-CM

## 2021-02-25 MED ORDER — NA SULFATE-K SULFATE-MG SULF 17.5-3.13-1.6 GM/177ML PO SOLN: 1.0000 | Freq: Once | ORAL | 0 refills | Status: AC

## 2021-02-25 NOTE — Progress Notes (Signed)

## 2021-03-07 ENCOUNTER — Telehealth: Payer: Self-pay | Admitting: Pulmonary Disease

## 2021-03-07 NOTE — Telephone Encounter (Signed)
Sched CT for 6/1 @ 9:30; 9:15am arrival time @ [facility]. Called and informed pt. Nothing further needed.

## 2021-03-10 ENCOUNTER — Telehealth: Payer: Self-pay | Admitting: Gastroenterology

## 2021-03-10 NOTE — Telephone Encounter (Signed)
Noted  

## 2021-03-10 NOTE — Telephone Encounter (Signed)
Hi Dr. Tarri Glenn, this patient just called to cancel procedure that was scheduled for tomorrow 03/11/21 because she is feeling sick, she was on a vacation trip and woke up this morning feeling nauseated that she does not think that she can do the prep. Patient has rescheduled to 05/20/21.  Thank you.

## 2021-03-10 NOTE — Telephone Encounter (Signed)
Pt r/s her procedure to July and is asking if she could have the same prep that she had for her last colonoscopy instead of Suprep. Pls call her.

## 2021-03-10 NOTE — Telephone Encounter (Signed)
Spoke with the patient. Pt will take the suprep, she has suprep at home.

## 2021-03-11 ENCOUNTER — Encounter: Payer: Medicare Other | Admitting: Gastroenterology

## 2021-03-17 ENCOUNTER — Other Ambulatory Visit: Payer: Self-pay

## 2021-03-17 ENCOUNTER — Inpatient Hospital Stay: Payer: Medicare Other | Attending: Hematology

## 2021-03-17 DIAGNOSIS — C50112 Malignant neoplasm of central portion of left female breast: Secondary | ICD-10-CM | POA: Diagnosis not present

## 2021-03-17 DIAGNOSIS — Z170 Estrogen receptor positive status [ER+]: Secondary | ICD-10-CM

## 2021-03-17 LAB — CBC WITH DIFFERENTIAL (CANCER CENTER ONLY)
Abs Immature Granulocytes: 0.03 10*3/uL (ref 0.00–0.07)
Basophils Absolute: 0 10*3/uL (ref 0.0–0.1)
Basophils Relative: 1 %
Eosinophils Absolute: 0.1 10*3/uL (ref 0.0–0.5)
Eosinophils Relative: 2 %
HCT: 39 % (ref 36.0–46.0)
Hemoglobin: 12.2 g/dL (ref 12.0–15.0)
Immature Granulocytes: 1 %
Lymphocytes Relative: 18 %
Lymphs Abs: 1.2 10*3/uL (ref 0.7–4.0)
MCH: 25.6 pg — ABNORMAL LOW (ref 26.0–34.0)
MCHC: 31.3 g/dL (ref 30.0–36.0)
MCV: 81.9 fL (ref 80.0–100.0)
Monocytes Absolute: 0.5 10*3/uL (ref 0.1–1.0)
Monocytes Relative: 8 %
Neutro Abs: 4.6 10*3/uL (ref 1.7–7.7)
Neutrophils Relative %: 70 %
Platelet Count: 322 10*3/uL (ref 150–400)
RBC: 4.76 MIL/uL (ref 3.87–5.11)
RDW: 16.3 % — ABNORMAL HIGH (ref 11.5–15.5)
WBC Count: 6.4 10*3/uL (ref 4.0–10.5)
nRBC: 0 % (ref 0.0–0.2)

## 2021-03-17 LAB — CMP (CANCER CENTER ONLY)
ALT: 6 U/L (ref 0–44)
AST: 11 U/L — ABNORMAL LOW (ref 15–41)
Albumin: 3.2 g/dL — ABNORMAL LOW (ref 3.5–5.0)
Alkaline Phosphatase: 88 U/L (ref 38–126)
Anion gap: 9 (ref 5–15)
BUN: 17 mg/dL (ref 8–23)
CO2: 27 mmol/L (ref 22–32)
Calcium: 9 mg/dL (ref 8.9–10.3)
Chloride: 105 mmol/L (ref 98–111)
Creatinine: 0.82 mg/dL (ref 0.44–1.00)
GFR, Estimated: >60 mL/min (ref >60)
Glucose, Bld: 111 mg/dL — ABNORMAL HIGH (ref 70–99)
Potassium: 4.9 mmol/L (ref 3.5–5.1)
Sodium: 141 mmol/L (ref 135–145)
Total Bilirubin: 0.3 mg/dL (ref 0.3–1.2)
Total Protein: 6.8 g/dL (ref 6.5–8.1)

## 2021-04-16 ENCOUNTER — Ambulatory Visit (HOSPITAL_COMMUNITY)
Admission: RE | Admit: 2021-04-16 | Discharge: 2021-04-16 | Disposition: A | Discharge status: Home / Self Care | Payer: Medicare Other | Source: Ambulatory Visit | Attending: Pulmonary Disease | Admitting: Pulmonary Disease

## 2021-04-16 ENCOUNTER — Other Ambulatory Visit: Payer: Self-pay

## 2021-04-16 DIAGNOSIS — R0602 Shortness of breath: Secondary | ICD-10-CM | POA: Diagnosis not present

## 2021-04-16 DIAGNOSIS — R918 Other nonspecific abnormal finding of lung field: Secondary | ICD-10-CM | POA: Diagnosis not present

## 2021-04-16 DIAGNOSIS — I2510 Atherosclerotic heart disease of native coronary artery without angina pectoris: Secondary | ICD-10-CM | POA: Diagnosis not present

## 2021-04-16 DIAGNOSIS — I700 Atherosclerosis of aorta: Secondary | ICD-10-CM | POA: Diagnosis not present

## 2021-04-16 DIAGNOSIS — Z853 Personal history of malignant neoplasm of breast: Secondary | ICD-10-CM | POA: Diagnosis not present

## 2021-04-16 DIAGNOSIS — J984 Other disorders of lung: Secondary | ICD-10-CM | POA: Diagnosis not present

## 2021-04-16 DIAGNOSIS — R9389 Abnormal findings on diagnostic imaging of other specified body structures: Secondary | ICD-10-CM | POA: Diagnosis not present

## 2021-04-23 ENCOUNTER — Other Ambulatory Visit: Payer: Self-pay

## 2021-04-23 ENCOUNTER — Encounter: Payer: Self-pay | Admitting: Pulmonary Disease

## 2021-04-23 ENCOUNTER — Ambulatory Visit (INDEPENDENT_AMBULATORY_CARE_PROVIDER_SITE_OTHER): Payer: Medicare Other | Admitting: Pulmonary Disease

## 2021-04-23 VITALS — BP 116/78 | HR 74 | Temp 97.8°F | Ht 65.0 in | Wt 175.0 lb

## 2021-04-23 DIAGNOSIS — R9389 Abnormal findings on diagnostic imaging of other specified body structures: Secondary | ICD-10-CM

## 2021-04-23 DIAGNOSIS — R918 Other nonspecific abnormal finding of lung field: Secondary | ICD-10-CM

## 2021-04-23 DIAGNOSIS — Z853 Personal history of malignant neoplasm of breast: Secondary | ICD-10-CM

## 2021-04-23 DIAGNOSIS — R0602 Shortness of breath: Secondary | ICD-10-CM | POA: Diagnosis not present

## 2021-04-23 NOTE — Progress Notes (Signed)
Synopsis: Referred in February 2022 for abnormal CT chest, history of breast cancer, PCP by Shelly Post, MD  Subjective:   PATIENT ID: Shelly [name] GENDER: female DOB: 05/10/1946, MRN: [id number]  Chief Complaint  Patient presents with  . Follow-up    No more SOB or cough    This is a 75 year old female, past medical history of arthritis, IBS, hyperlipidemia, osteoporosis, vitamin D deficiency.  Followed by Dr. Annamaria Boots from medical oncology patient was diagnosed in November 2020 with a stage Ia (cT1b, N0, M0, G2, ER positive, PR positive, HER-2 negative, breast cancer patient underwent lumpectomy and sentinel node biopsy, patient completed adjuvant chemotherapy and radiation with Shelly [name]..  Patient Covid vaccine plus booster.  She felt sick after a Covid exposure but at the time she states she tested negative.  She had a follow-up CT image from oncology on 12/04/2020 which revealed bilateral consolidations and infiltrates.  Recommending close follow-up.  Patient was referred for evaluation by pulmonary regarding abnormality seen on CT imaging.  OV 12/18/2020: Here today for follow-up and evaluation of abnormal CT imaging.  She does complain today of dyspnea on exertion, shortness of breath.  It is usually worse in the morning.  She denies hemoptysis.  She does have night sweats that occur 3-4 times a week.  All of which are new over the past couple of months.  OV 01/20/2021: Here today for follow-up after recent bronchoscopy.  Patient was taken for navigational bronchoscopy on 12/27/2020.  BAL results revealed 53% lymphocytes 3% eos.  Culture results from bronchoscopy had polymicrobial normal flora, fungal cultures to date no growth.  AFB smear negative.  Biopsies/cytology of station 7 and transbronchial biopsies were negative except for left lower lobe fine-needle aspirate with atypical cells present.  The patient has subsequently followed up with medical oncology on 01/13/2021, Dr. Burr Medico,  office note reviewed today.  OV 04/23/2021: Here today for follow-up after recent CT scan of the chest.  Doing well since bronchoscopy.  All culture results negative.  Repeat CT scan of the chest last week reviewed today in the office.  Left-sided consolidation has resolved.  She now has a right lower lobe consolidation.  Her shortness of breath cough night sweats have all ended.  From a respiratory standpoint she feels better.  She is able to get around to complete all of her ADLs.  She has a planned vacation to [place] with her husband at the end of the month.     Oncology History Overview Note  Cancer Staging Cancer of central portion of left female breast [facility]) Staging form: Breast, AJCC 8th Edition - Clinical stage from 09/18/2019: Stage IA (cT1b, cN0, cM0, G2, ER+, PR+, HER2-) - Signed by Shelly Merle, MD on 09/18/2019 - Pathologic stage from 10/06/2019: Stage IA (pT1c, pN1a, cM0, G2, ER+, PR+, HER2-) - Signed by Shelly Merle, MD on 10/19/2019    Cancer of central portion of left female breast ([place])  08/24/2019 Mammogram   Diagnostic mammogram 08/24/19  IMPRESSION: 1. Left breast mass measuring 0.8 x 0.4 x 0.5 cm at the 1 o'clock retroareolar region is suspicious and likely corresponds to the area of distortion seen on mammography.   2. Left breast 3 o'clock retroareolar dilated duct with internal debris versus solid mass.   3. Right breast retroareolar focal asymmetry without sonographic correlate.   08/28/2019 Initial Biopsy   Final Diagnosis 08/28/19 1. Breast, left, needle core biopsy, 1 o'clock/retroareolar - INVASIVE DUCTAL CARCINOMA, GRADE II. - PERINEURAL  INVASION. - SEE MICROSCOPIC DESCRIPTION. 2. Breast, left, needle core biopsy, 3 o'clock retroareolar - DUCTAL PAPILLOMA. - SEE MICROSCOPIC DESCRIPTION.   08/28/2019 Receptors her2   1. PROGNOSTIC INDICATORS 08/28/19 Results: IMMUNOHISTOCHEMICAL AND MORPHOMETRIC ANALYSIS PERFORMED MANUALLY The tumor cells are NEGATIVE  for Her2 (0). Estrogen Receptor: 100%, POSITIVE, STRONG STAINING INTENSITY Progesterone Receptor: 50%, POSITIVE, STRONG STAINING INTENSITY Proliferation Marker Ki67: 12%   09/01/2019 Pathology Results   Diagnosis 09/01/19 Breast, right, needle core biopsy, retroareolar - COMPLEX SCLEROSING LESION WITH A PAPILLARY SCLEROSING LESION AND USUAL DUCTAL HYPERPLASIA. SEE NOTE - NEGATIVE FOR CARCINOMA   09/18/2019 Initial Diagnosis   Cancer of central portion of left female breast (Shelly [name])   09/18/2019 Cancer Staging   Staging form: Breast, AJCC 8th Edition - Clinical stage from 09/18/2019: Stage IA (cT1b, cN0, cM0, G2, ER+, PR+, HER2-) - Signed by Shelly Merle, MD on 09/18/2019   10/06/2019 Surgery   LEFT BREAST LUMPECTOMY  X2 WITH RADIOACTIVE SEED X2  AND SENTINEL LYMPH NODE BIOPSY and  RIGHT BREAST LUMPECTOMY WITH RADIOACTIVE SEED LOCALIZATION by Dr Marlou Starks  10/06/19    10/06/2019 Pathology Results   FINAL MICROSCOPIC DIAGNOSIS: 10/06/19   A. BREAST, RIGHT, LUMPECTOMY:  - Fibrocystic change with a small incidental intraductal papilloma and  usual ductal hyperplasia  - Biopsy site changes  - Negative for carcinoma   B. LYMPH NODE, LEFT #1, SENTINEL, BIOPSY:  - Metastatic carcinoma to a lymph node (1/1)  - Focus of metastatic carcinoma measures 2.5 mm and shows extranodal  extension   C. LYMPH NODE, LEFT #2, SENTINEL, BIOPSY:  - Lymph node, negative for carcinoma (0/1)   D. LYMPH NODE, LEFT #3, SENTINEL, BIOPSY:  - Lymph node, negative for carcinoma (0/1)   E. BREAST, LEFT, LUMPECTOMY:  - Invasive ductal carcinoma, grade 2, 1.8 cm  - Ductal carcinoma in situ, intermediate grade  - Invasive carcinoma focally involves the anterior margin  - DCIS is less than 1 mm from the superior margin  - Background breast parenchyma with fibrocystic change, usual ductal  hyperplasia and intraductal papilloma.  - Biopsy site changes  - See oncology table    10/06/2019 Miscellaneous   Her Mammaprint  showed High Risk Luminal Type B, risk of recurrence in the next 10 years is 29% in high risk disease. MPI -0.335    10/06/2019 Cancer Staging   Staging form: Breast, AJCC 8th Edition - Pathologic stage from 10/06/2019: Stage IA (pT1c, pN1a, cM0, G2, ER+, PR+, HER2-) - Signed by Shelly Merle, MD on 10/19/2019   11/06/2019 Surgery   EXCISION NIPPLE AND AREOLA  MARGIN LEFT BREAST by Dr Marlou Starks  11/06/19    11/06/2019 Pathology Results    FINAL MICROSCOPIC DIAGNOSIS: 11/06/19  A. BREAST, LEFT CENTRAL, RE-EXCISION:  -  Residual ductal carcinoma in situ, intermediate grade  -  Margins uninvolved by carcinoma (0.4 cm; superior margin)  -  Atypical intraductal papilloma  -  Usual ductal hyperplasia  -  Benign skin with seborrheic keratosis  -  Previous surgical site changes  -  No invasive carcinoma identified    12/06/2019 - 12/20/2019 Chemotherapy   Adjuvant chemo TC q3weeks for 4 cycles starting 12/06/19. With first TC infusion she had allergy reaction to docetaxel and [name] was not given on 12/06/19, we discontinued.   -I started her on weekly Abraxane on 12/20/19, but 2 days after infusion she started to feel tachycardic, hot/flushed, and lightheaded. A few days later she had dysuria, diarrhea and abdominal cramps and intermittent chest pain.  She opted to d/c chemo and proceed with RT.    01/25/2020 - 03/06/2020 Radiation Therapy   Adjuvant Radiation with Dr Isidore [name] 01/25/20-03/06/20   05/2020 - 07/2020 Anti-estrogen oral therapy   I started her on antiestrogen therapy with Tamoxifen in 03/2020. She took until 06/06/20 and 45m 06/04/20- 06/2020 but stopped due to exacerbated IBS symptoms. She also tried 2 weeks of Exemestane in 07/2020 but stopped due to increased Anxiety.   06/06/2020 Survivorship   Care plan delivered by [name] NP    12/04/2020 Imaging   CT chest  IMPRESSION: 1. Marked interval worsening of parenchymal disease now involving areas of the LEFT lower lobe, lingula, RIGHT upper lobe  and with nodular opacity in the RIGHT lower lobe. Findings could be seen in the setting of COVID-19 infection. Pattern of disease also raises the question of organizing pneumonia. Pneumonitis, drug related changes are also considered. Pattern of disease would be atypical for neoplasm. Pulmonary consultation is suggested if not yet performed. Continued short interval surveillance is also suggested. 2. [name] LEFT breast lumpectomy and axillary dissection. 3. Calcified coronary artery disease in LEFT and RIGHT coronary circulation. 4. Stable LEFT adrenal nodule compatible with benign adenoma. 5. Aortic atherosclerosis.   Aortic Atherosclerosis (ICD10-I70.0).      Past Medical History:  Diagnosis Date  . Allergy   . Arthritis    wrist, shoulder   . Cancer ([place] 08/2019   left breast IDC  . Diverticulitis   . Endometrial polyp   . Family history of breast cancer   . GERD (gastroesophageal reflux disease)    occasional - diet controlled  . Hx of adenomatous colonic polyps   . Hyperlipidemia   . IBS (irritable bowel syndrome)   . Missed abortion    x 1 - resolved - no surgery  . Osteoporosis   . SVD (spontaneous vaginal delivery)    x 1  . Transient vision disturbance 2019   was evaluated by ED, no findings  . Vitamin D deficiency      Family History  Problem Relation Age of Onset  . Hyperlipidemia Mother   . Hypertension Mother   . Diabetes Mother        type ll  . Leukemia Mother 712 . Colon polyps Mother   . Lung cancer Father   . Hyperlipidemia Father   . Breast cancer Cousin 30       maternal first couin  . Heart disease Other   . Hypertension Other   . Cancer Other   . Depression Brother   . Breast cancer Maternal Aunt 559 . Uterine cancer Paternal Grandmother   . Breast cancer Maternal Aunt 597 . Lung cancer Maternal Grandfather   . Cancer Maternal Grandfather        gastric cancer?  . Cancer Maternal Uncle        unknown  . Brain cancer Paternal  Uncle   . Throat cancer Paternal Uncle   . Colon cancer Neg Hx   . Esophageal cancer Neg Hx   . Stomach cancer Neg Hx   . Rectal cancer Neg Hx      Past Surgical History:  Procedure Laterality Date  . BREAST EXCISIONAL BIOPSY Right 08/2019   Benign, lesion   . BREAST LUMPECTOMY Left 09/2019  . BREAST LUMPECTOMY WITH RADIOACTIVE SEED AND SENTINEL LYMPH NODE BIOPSY Left 10/06/2019   Procedure: LEFT BREAST LUMPECTOMY  X2 WITH RADIOACTIVE SEED X2  AND SENTINEL LYMPH NODE BIOPSY;  Surgeon:  Jovita Kussmaul, MD;  Location: [facility];  Service: General;  Laterality: Left;  . BREAST LUMPECTOMY WITH RADIOACTIVE SEED LOCALIZATION Right 10/06/2019   Procedure: RIGHT BREAST LUMPECTOMY WITH RADIOACTIVE SEED LOCALIZATION;  Surgeon: Jovita Kussmaul, MD;  Location: [place];  Service: General;  Laterality: Right;  . BRONCHIAL BIOPSY  12/27/2020   Procedure: BRONCHIAL BIOPSIES;  Surgeon: Garner Nash, DO;  Location: [place] [facility];  Service: Pulmonary;;  . BRONCHIAL BRUSHINGS  12/27/2020   Procedure: BRONCHIAL BRUSHINGS;  Surgeon: Garner Nash, DO;  Location: [place] [facility];  Service: Pulmonary;;  . BRONCHIAL NEEDLE ASPIRATION BIOPSY  12/27/2020   Procedure: BRONCHIAL NEEDLE ASPIRATION BIOPSIES;  Surgeon: Garner Nash, DO;  Location: [place] [facility];  Service: Pulmonary;;  . BRONCHIAL WASHINGS  12/27/2020   Procedure: BRONCHIAL WASHINGS;  Surgeon: Garner Nash, DO;  Location: [place] [facility];  Service: Pulmonary;;  . COLONOSCOPY    . DIAGNOSTIC LAPAROSCOPY     cysts  . DILATATION & CURETTAGE/HYSTEROSCOPY WITH TRUECLEAR N/A 10/31/2013   Procedure: DILATATION & CURETTAGE/HYSTEROSCOPY WITH TRUCLEAR;  Surgeon: Marylynn Pearson, MD;  Location: [place];  Service: Gynecology;  Laterality: N/A;  . DILATION AND CURETTAGE OF UTERUS    . MASS EXCISION Left 11/06/2019   Procedure: EXCISION NIPPLE AND AREOLA  MARGIN LEFT BREAST;  Surgeon: Jovita Kussmaul, MD;  Location: [place];  Service: General;  Laterality: Left;  . TONSILLECTOMY  1952  . VIDEO BRONCHOSCOPY WITH ENDOBRONCHIAL NAVIGATION Bilateral 12/27/2020   Procedure: VIDEO BRONCHOSCOPY WITH ENDOBRONCHIAL NAVIGATION;  Surgeon: Garner Nash, DO;  Location: [place];  Service: Pulmonary;  Laterality: Bilateral;  . VIDEO BRONCHOSCOPY WITH ENDOBRONCHIAL ULTRASOUND Bilateral 12/27/2020   Procedure: VIDEO BRONCHOSCOPY WITH ENDOBRONCHIAL ULTRASOUND;  Surgeon: Garner Nash, DO;  Location: [place];  Service: Pulmonary;  Laterality: Bilateral;  . WISDOM TOOTH EXTRACTION      Social History   Socioeconomic History  . Marital status: Married    Spouse name: Not on file  . Number of children: 1  . Years of education: Not on file  . Highest education level: Not on file  Occupational History  . Not on file  Tobacco Use  . Smoking status: Former Smoker    Packs/day: 0.10    Years: 20.00    Pack years: 2.00    Types: Cigarettes    Quit date: 05/15/2007    Years since quitting: 13.9  . Smokeless tobacco: Never Used  Vaping Use  . Vaping Use: Never used  Substance and Sexual Activity  . Alcohol use: Yes    Alcohol/week: 14.0 standard drinks    Types: 14 Glasses of wine per week    Comment: 2 glasses wine every evening  . Drug use: No  . Sexual activity: Yes    Birth control/protection: [name]-menopausal  Other Topics Concern  . Not on file  Social History Narrative   Lives with husband in 2 story house   Has one daughter, with 3 grandchildren, local, supportive   Enjoys gardening/walking outside with husband   Occasionally does home exercise calisthenics but not recently.   Well-balanced diet overall   Social Determinants of Health   Financial Resource Strain: Not on file  Food Insecurity: Not on file  Transportation Needs: Not on file  Physical Activity: Not on file  Stress: Not on file  Social Connections: Not on file  Intimate Partner Violence: Not on file      Allergies  Allergen Reactions  .  Taxotere [Docetaxel] Swelling  . Abraxane [Paclitaxel Protein-Bound Part] Other (See Comments)    3 days after receiving she had one episode of very high BP and HR. This happened again 2 days later during the night.   Marland Kitchen Crestor [Rosuvastatin Calcium] Itching    GI upset  . Lipitor [Atorvastatin Calcium] Itching  . Nsaids Other (See Comments)    GI upset  . Pravastatin Itching    GI upset  . Sulfonamide Derivatives Rash    Hives, itiching     Outpatient Medications Prior to Visit  Medication Sig Dispense Refill  . clidinium-chlordiazePOXIDE (LIBRAX) 5-2.5 MG capsule Take 1 capsule by mouth daily as needed (IBS).    Marland Kitchen denosumab (PROLIA) 60 MG/ML SOSY injection Inject 60 mg into the skin every 6 (six) months.    . loratadine (CLARITIN) 10 MG tablet Take 10 mg by mouth daily.    Vladimir Faster Glycol-Propyl Glycol (LUBRICANT EYE DROPS) 0.4-0.3 % SOLN Place 1 drop into both eyes 3 (three) times daily as needed (dry/irritated eyes).    Marland Kitchen acetaminophen (TYLENOL) 500 MG tablet Take 500 mg by mouth every 6 (six) hours as needed for headache or mild pain. (Patient not taking: Reported on 04/23/2021)    . esomeprazole (NEXIUM) 20 MG capsule Take 20 mg by mouth daily as needed (acid reflux).  (Patient not taking: Reported on 04/23/2021)    . Vitamin D, Ergocalciferol, (DRISDOL) 1.25 MG (50000 UNIT) CAPS capsule TAKE 1 CAPSULE (50,000 UNITS TOTAL) BY MOUTH EVERY 7 (SEVEN) DAYS. (Patient not taking: No sig reported) 12 capsule 1   No facility-administered medications prior to visit.    Review of Systems  Constitutional: Negative for chills, fever, malaise/fatigue and weight loss.  HENT: Negative for hearing loss, sore throat and tinnitus.   Eyes: Negative for blurred vision and double vision.  Respiratory: Positive for shortness of breath. Negative for cough, hemoptysis, sputum production, wheezing and stridor.   Cardiovascular: Negative for chest pain, palpitations,  orthopnea, leg swelling and PND.  Gastrointestinal: Negative for abdominal pain, constipation, diarrhea, heartburn, nausea and vomiting.  Genitourinary: Negative for dysuria, hematuria and urgency.  Musculoskeletal: Negative for joint pain and myalgias.  Skin: Negative for itching and rash.  Neurological: Negative for dizziness, tingling, weakness and headaches.  Endo/Heme/Allergies: Negative for environmental allergies. Does not bruise/bleed easily.  Psychiatric/Behavioral: Negative for depression. The patient is not nervous/anxious and does not have insomnia.   All other systems reviewed and are negative.    Objective:  Physical Exam Vitals reviewed.  Constitutional:      General: She is not in acute distress.    Appearance: She is well-developed.  HENT:     Head: Normocephalic and atraumatic.  Eyes:     General: No scleral icterus.    Conjunctiva/sclera: Conjunctivae normal.     Pupils: Pupils are equal, round, and reactive to light.  Neck:     Vascular: No JVD.     Trachea: No tracheal deviation.  Cardiovascular:     Rate and Rhythm: Normal rate and regular rhythm.     Heart sounds: Normal heart sounds. No murmur heard.   Pulmonary:     Effort: Pulmonary effort is normal. No tachypnea, accessory muscle usage or respiratory distress.     Breath sounds: No stridor. No wheezing, rhonchi or rales.  Abdominal:     General: Bowel sounds are normal. There is no distension.     Palpations: Abdomen is soft.     Tenderness: There is no abdominal tenderness.  Musculoskeletal:        General: No tenderness.     Cervical back: Neck supple.  Lymphadenopathy:     Cervical: No cervical adenopathy.  Skin:    General: Skin is warm and dry.     Capillary Refill: Capillary refill takes less than 2 seconds.     Findings: No rash.  Neurological:     Mental Status: She is alert and oriented to person, place, and time.  Psychiatric:        Behavior: Behavior normal.     Vitals:    04/23/21 1000  BP: 116/78  Pulse: 74  Temp: 97.8 F (36.6 C)  SpO2: 95%  Weight: 175 lb (79.4 kg)  Height: _0  (1.651 m)   95% on  RA BMI Readings from Last 3 Encounters:  04/23/21 29.12 kg/m  02/25/21 28.86 kg/m  01/20/21 29.05 kg/m   Wt Readings from Last 3 Encounters:  04/23/21 175 lb (79.4 kg)  02/25/21 173 lb 6.4 oz (78.7 kg)  01/20/21 174 lb 9.6 oz (79.2 kg)     CBC    Component Value Date/Time   WBC 6.4 03/17/2021 0850   WBC 5.0 02/12/2020 1230   RBC 4.76 03/17/2021 0850   HGB 12.2 03/17/2021 0850   HCT 39.0 03/17/2021 0850   HCT 36.3 01/13/2021 1355   PLT 322 03/17/2021 0850   MCV 81.9 03/17/2021 0850   MCH 25.6 (L) 03/17/2021 0850   MCHC 31.3 03/17/2021 0850   RDW 16.3 (H) 03/17/2021 0850   LYMPHSABS 1.2 03/17/2021 0850   MONOABS 0.5 03/17/2021 0850   EOSABS 0.1 03/17/2021 0850   BASOSABS 0.0 03/17/2021 0850     Chest Imaging: CT chest 12/04/2020: Dense parenchymal abnormalities within the periphery of the left lower lobe as well as right upper lobe.  Airways adjacent concerning for inflammatory process. The patient's images have been independently reviewed by me.    CT Chest June 2022:  Waxing and waning multifocal opacities within the chest.  The left lower lobe is now clear now she has disease within the right lower lobe.  I suspect this correlates well with diagnosis of organizing pneumonia.  Previous bronchoscopy cytologies were negative. The patient's images have been independently reviewed by me.    Pulmonary Functions Testing Results: No flowsheet data found.  FeNO:   Pathology:   12/27/2020: Bronchoscopy cytology from lavage negative, left lower lobe fine-needle aspirate with "atypical cells"  Echocardiogram:   Heart Catheterization:     Assessment & Plan:     ICD-10-CM   1. Pulmonary infiltrate present on computed tomography  R91.8 CT CHEST HIGH RESOLUTION  2. Abnormal CT of the chest  R93.89   3. History of breast cancer   Z85.3   4. SOB (shortness of breath)  R06.02     Discussion:  This is a 75 year old female, diffuse parenchymal infiltrates nonresolving pneumonia symptoms.  Her shortness of breath cough and night sweats have resolved.  She did well with her bronchoscopy all of the samples cultures and cytology were negative.  I think we are likely dealing with organizing pneumonia based on radiographic appearance and the fact that she has fleeting infiltrates that have moved from one side to the other.  If her symptoms persist or CAT scan imaging gets worse we always could consider immune suppression.  And a trial of steroids.  Plan: I think right now we should just monitor her symptoms she is asymptomatic. We will repeat her CAT scan in 1 year.  If she calls in with persistent symptoms or change would get CT imaging sooner. If her disease looks like it has progressed we could consider trial of steroids. There is some data for steroid sparing use of azithromycin and organizing pneumonia as well. Overall a true diagnosis may only be obtained from an open lung biopsy but as the fact that she is asymptomatic and 75 years old and not sure that doing open lung biopsy is necessary.  Patient is agreeable to a more conservative approach and watchful waiting of symptoms.  RTC in 1 year after CT imaging.    Current Outpatient Medications:  .  clidinium-chlordiazePOXIDE (LIBRAX) 5-2.5 MG capsule, Take 1 capsule by mouth daily as needed (IBS)., Disp: , Rfl:  .  denosumab (PROLIA) 60 MG/ML SOSY injection, Inject 60 mg into the skin every 6 (six) months., Disp: , Rfl:  .  loratadine (CLARITIN) 10 MG tablet, Take 10 mg by mouth daily., Disp: , Rfl:  .  Polyethyl Glycol-Propyl Glycol (LUBRICANT EYE DROPS) 0.4-0.3 % SOLN, Place 1 drop into both eyes 3 (three) times daily as needed (dry/irritated eyes)., Disp: , Rfl:  .  acetaminophen (TYLENOL) 500 MG tablet, Take 500 mg by mouth every 6 (six) hours as needed for headache  or mild pain. (Patient not taking: Reported on 04/23/2021), Disp: , Rfl:  .  esomeprazole (NEXIUM) 20 MG capsule, Take 20 mg by mouth daily as needed (acid reflux).  (Patient not taking: Reported on 04/23/2021), Disp: , Rfl:  .  Vitamin D, Ergocalciferol, (DRISDOL) 1.25 MG (50000 UNIT) CAPS capsule, TAKE 1 CAPSULE (50,000 UNITS TOTAL) BY MOUTH EVERY 7 (SEVEN) DAYS. (Patient not taking: No sig reported), Disp: 12 capsule, Rfl: 1   Garner Nash, DO [facility] 04/23/2021 10:22 AM

## 2021-04-23 NOTE — Patient Instructions (Addendum)
Thank you for visiting Dr. Valeta Harms at [facility]. Today we recommend the following:  Orders Placed This Encounter  Procedures  . CT CHEST HIGH RESOLUTION   Repeat CT Chest in 1 year. Follow up after that.   Return in about 1 year (around 04/23/2022), or if symptoms worsen or fail to improve, for with APP or Dr. Valeta Harms.    Please do your part to reduce the spread of COVID-19.

## 2021-05-06 DIAGNOSIS — I83811 Varicose veins of right lower extremities with pain: Secondary | ICD-10-CM | POA: Diagnosis not present

## 2021-05-06 DIAGNOSIS — I8311 Varicose veins of right lower extremity with inflammation: Secondary | ICD-10-CM | POA: Diagnosis not present

## 2021-05-08 DIAGNOSIS — I8311 Varicose veins of right lower extremity with inflammation: Secondary | ICD-10-CM | POA: Diagnosis not present

## 2021-05-16 ENCOUNTER — Inpatient Hospital Stay (HOSPITAL_BASED_OUTPATIENT_CLINIC_OR_DEPARTMENT_OTHER): Payer: Medicare Other | Admitting: Hematology

## 2021-05-16 ENCOUNTER — Other Ambulatory Visit: Payer: Self-pay

## 2021-05-16 ENCOUNTER — Encounter: Payer: Self-pay | Admitting: Hematology

## 2021-05-16 ENCOUNTER — Inpatient Hospital Stay: Payer: Medicare Other | Attending: Hematology

## 2021-05-16 VITALS — BP 151/66 | HR 76 | Temp 98.0°F | Resp 18 | Wt 176.5 lb

## 2021-05-16 DIAGNOSIS — Z170 Estrogen receptor positive status [ER+]: Secondary | ICD-10-CM

## 2021-05-16 DIAGNOSIS — M810 Age-related osteoporosis without current pathological fracture: Secondary | ICD-10-CM | POA: Diagnosis not present

## 2021-05-16 DIAGNOSIS — K580 Irritable bowel syndrome with diarrhea: Secondary | ICD-10-CM | POA: Diagnosis not present

## 2021-05-16 DIAGNOSIS — C50112 Malignant neoplasm of central portion of left female breast: Secondary | ICD-10-CM | POA: Diagnosis not present

## 2021-05-16 DIAGNOSIS — H524 Presbyopia: Secondary | ICD-10-CM | POA: Diagnosis not present

## 2021-05-16 DIAGNOSIS — H04123 Dry eye syndrome of bilateral lacrimal glands: Secondary | ICD-10-CM | POA: Diagnosis not present

## 2021-05-16 LAB — CMP (CANCER CENTER ONLY)
ALT: 11 U/L (ref 0–44)
AST: 12 U/L — ABNORMAL LOW (ref 15–41)
Albumin: 3.4 g/dL — ABNORMAL LOW (ref 3.5–5.0)
Alkaline Phosphatase: 69 U/L (ref 38–126)
Anion gap: 6 (ref 5–15)
BUN: 19 mg/dL (ref 8–23)
CO2: 27 mmol/L (ref 22–32)
Calcium: 8.7 mg/dL — ABNORMAL LOW (ref 8.9–10.3)
Chloride: 105 mmol/L (ref 98–111)
Creatinine: 0.86 mg/dL (ref 0.44–1.00)
GFR, Estimated: >60 mL/min (ref >60)
Glucose, Bld: 100 mg/dL — ABNORMAL HIGH (ref 70–99)
Potassium: 4.2 mmol/L (ref 3.5–5.1)
Sodium: 138 mmol/L (ref 135–145)
Total Bilirubin: 0.3 mg/dL (ref 0.3–1.2)
Total Protein: 6.7 g/dL (ref 6.5–8.1)

## 2021-05-16 LAB — CBC WITH DIFFERENTIAL (CANCER CENTER ONLY)
Abs Immature Granulocytes: 0.02 10*3/uL (ref 0.00–0.07)
Basophils Absolute: 0 10*3/uL (ref 0.0–0.1)
Basophils Relative: 1 %
Eosinophils Absolute: 0.1 10*3/uL (ref 0.0–0.5)
Eosinophils Relative: 2 %
HCT: 38.5 % (ref 36.0–46.0)
Hemoglobin: 12.3 g/dL (ref 12.0–15.0)
Immature Granulocytes: 0 %
Lymphocytes Relative: 21 %
Lymphs Abs: 1.3 10*3/uL (ref 0.7–4.0)
MCH: 26.7 pg (ref 26.0–34.0)
MCHC: 31.9 g/dL (ref 30.0–36.0)
MCV: 83.7 fL (ref 80.0–100.0)
Monocytes Absolute: 0.6 10*3/uL (ref 0.1–1.0)
Monocytes Relative: 10 %
Neutro Abs: 4.1 10*3/uL (ref 1.7–7.7)
Neutrophils Relative %: 66 %
Platelet Count: 298 10*3/uL (ref 150–400)
RBC: 4.6 MIL/uL (ref 3.87–5.11)
RDW: 16 % — ABNORMAL HIGH (ref 11.5–15.5)
WBC Count: 6.1 10*3/uL (ref 4.0–10.5)
nRBC: 0 % (ref 0.0–0.2)

## 2021-05-16 NOTE — Progress Notes (Signed)
[place]   [phone] Fax:([phone]) [phone]   Clinic Follow up Note   Patient Care Team: Eulas Post, MD as PCP - General (Family Medicine) Barbaraann Cao, [place] as Referring Physician (Optometry) Jovita Kussmaul, MD as Consulting Physician (General Surgery) Mauro Kaufmann, RN as Oncology Nurse Navigator Rockwell Germany, RN as Oncology Nurse Navigator Truitt Merle, MD as Consulting Physician (Hematology) Alla Feeling, NP as Nurse Practitioner (Nurse Practitioner) Eppie Gibson, MD as Attending Physician (Radiation Oncology)  Date of Service:  05/16/2021  CHIEF COMPLAINT: f/u of left breast cancer  SUMMARY OF ONCOLOGIC HISTORY: Oncology History Overview Note  Cancer Staging Cancer of central portion of left female breast [facility]) Staging form: Breast, AJCC 8th Edition - Clinical stage from 09/18/2019: Stage IA (cT1b, cN0, cM0, G2, ER+, PR+, HER2-) - Signed by Truitt Merle, MD on 09/18/2019 - Pathologic stage from 10/06/2019: Stage IA (pT1c, pN1a, cM0, G2, ER+, PR+, HER2-) - Signed by Truitt Merle, MD on 10/19/2019     Cancer of central portion of left female breast ([place])  08/24/2019 Mammogram   Diagnostic mammogram 08/24/19  IMPRESSION: 1. Left breast mass measuring 0.8 x 0.4 x 0.5 cm at the 1 o'clock retroareolar region is suspicious and likely corresponds to the area of distortion seen on mammography.   2. Left breast 3 o'clock retroareolar dilated duct with internal debris versus solid mass.   3. Right breast retroareolar focal asymmetry without sonographic correlate.   08/28/2019 Initial Biopsy   Final Diagnosis 08/28/19 1. Breast, left, needle core biopsy, 1 o'clock/retroareolar - INVASIVE DUCTAL CARCINOMA, GRADE II. - PERINEURAL INVASION. - SEE MICROSCOPIC DESCRIPTION. 2. Breast, left, needle core biopsy, 3 o'clock retroareolar - DUCTAL PAPILLOMA. - SEE MICROSCOPIC DESCRIPTION.   08/28/2019 Receptors her2   1. PROGNOSTIC INDICATORS  08/28/19 Results: IMMUNOHISTOCHEMICAL AND MORPHOMETRIC ANALYSIS PERFORMED MANUALLY The tumor cells are NEGATIVE for Her2 (0). Estrogen Receptor: 100%, POSITIVE, STRONG STAINING INTENSITY Progesterone Receptor: 50%, POSITIVE, STRONG STAINING INTENSITY Proliferation Marker Ki67: 12%   09/01/2019 Pathology Results   Diagnosis 09/01/19 Breast, right, needle core biopsy, retroareolar - COMPLEX SCLEROSING LESION WITH A PAPILLARY SCLEROSING LESION AND USUAL DUCTAL HYPERPLASIA. SEE NOTE - NEGATIVE FOR CARCINOMA   09/18/2019 Initial Diagnosis   Cancer of central portion of left female breast ([place])    09/18/2019 Cancer Staging   Staging form: Breast, AJCC 8th Edition - Clinical stage from 09/18/2019: Stage IA (cT1b, cN0, cM0, G2, ER+, PR+, HER2-) - Signed by Truitt Merle, MD on 09/18/2019    10/06/2019 Surgery   LEFT BREAST LUMPECTOMY  X2 WITH RADIOACTIVE SEED X2  AND SENTINEL LYMPH NODE BIOPSY and  RIGHT BREAST LUMPECTOMY WITH RADIOACTIVE SEED LOCALIZATION by Dr Marlou Starks  10/06/19    10/06/2019 Pathology Results   FINAL MICROSCOPIC DIAGNOSIS: 10/06/19   A. BREAST, RIGHT, LUMPECTOMY:  - Fibrocystic change with a small incidental intraductal papilloma and  usual ductal hyperplasia  - Biopsy site changes  - Negative for carcinoma   B. LYMPH NODE, LEFT #1, SENTINEL, BIOPSY:  - Metastatic carcinoma to a lymph node (1/1)  - Focus of metastatic carcinoma measures 2.5 mm and shows extranodal  extension   C. LYMPH NODE, LEFT #2, SENTINEL, BIOPSY:  - Lymph node, negative for carcinoma (0/1)   D. LYMPH NODE, LEFT #3, SENTINEL, BIOPSY:  - Lymph node, negative for carcinoma (0/1)   E. BREAST, LEFT, LUMPECTOMY:  - Invasive ductal carcinoma, grade 2, 1.8 cm  - Ductal carcinoma in situ, intermediate grade  -  Invasive carcinoma focally involves the anterior margin  - DCIS is less than 1 mm from the superior margin  - Background breast parenchyma with fibrocystic change, usual ductal  hyperplasia  and intraductal papilloma.  - Biopsy site changes  - See oncology table    10/06/2019 Miscellaneous   Her Mammaprint showed High Risk Luminal Type B, risk of recurrence in the next 10 years is 29% in high risk disease. MPI -0.335    10/06/2019 Cancer Staging   Staging form: Breast, AJCC 8th Edition - Pathologic stage from 10/06/2019: Stage IA (pT1c, pN1a, cM0, G2, ER+, PR+, HER2-) - Signed by Truitt Merle, MD on 10/19/2019    11/06/2019 Surgery   EXCISION NIPPLE AND AREOLA  MARGIN LEFT BREAST by Dr Marlou Starks  11/06/19    11/06/2019 Pathology Results    FINAL MICROSCOPIC DIAGNOSIS: 11/06/19  A. BREAST, LEFT CENTRAL, RE-EXCISION:  -  Residual ductal carcinoma in situ, intermediate grade  -  Margins uninvolved by carcinoma (0.4 cm; superior margin)  -  Atypical intraductal papilloma  -  Usual ductal hyperplasia  -  Benign skin with seborrheic keratosis  -  Previous surgical site changes  -  No invasive carcinoma identified    12/06/2019 - 12/20/2019 Chemotherapy   Adjuvant chemo TC q3weeks for 4 cycles starting 12/06/19. With first TC infusion she had allergy reaction to docetaxel and [name] was not given on 12/06/19, we discontinued.   -I started her on weekly Abraxane on 12/20/19, but 2 days after infusion she started to feel tachycardic, hot/flushed, and lightheaded. A few days later she had dysuria, diarrhea and abdominal cramps and intermittent chest pain. She opted to d/c chemo and proceed with RT.    01/25/2020 - 03/06/2020 Radiation Therapy   Adjuvant Radiation with Dr Isidore Moos 01/25/20-03/06/20   05/2020 - 07/2020 Anti-estrogen oral therapy   I started her on antiestrogen therapy with Tamoxifen in 03/2020. She took until 06/06/20 and 79m 06/04/20- 06/2020 but stopped due to exacerbated IBS symptoms. She also tried 2 weeks of Exemestane in 07/2020 but stopped due to increased Anxiety.   06/06/2020 Survivorship   Care plan delivered by [name] NP    12/04/2020 Imaging   CT chest   IMPRESSION: 1. Marked interval worsening of parenchymal disease now involving areas of the LEFT lower lobe, lingula, RIGHT upper lobe and with nodular opacity in the RIGHT lower lobe. Findings could be seen in the setting of COVID-19 infection. Pattern of disease also raises the question of organizing pneumonia. Pneumonitis, drug related changes are also considered. Pattern of disease would be atypical for neoplasm. Pulmonary consultation is suggested if not yet performed. Continued short interval surveillance is also suggested. 2. Post LEFT breast lumpectomy and axillary dissection. 3. Calcified coronary artery disease in LEFT and RIGHT coronary circulation. 4. Stable LEFT adrenal nodule compatible with benign adenoma. 5. Aortic atherosclerosis.   Aortic Atherosclerosis (ICD10-I70.0).      CURRENT THERAPY:  Surveillance  INTERVAL HISTORY:  PDarlynn RiccoElder is here for a follow up of breast cancer. She was last seen by me on 01/13/21. She presents to the clinic alone. She reports she is no longer experiencing any respiratory symptoms or night sweats. She notes this resolved following her bronchoscopy. She notes residual soreness to her breast. She notes she is scheduled for colonoscopy next week. She has these done with [facility]. She notes issues finding the right pillow. She recently bought a new one but woke up this morning with a stiff neck. She reports  she had experienced itching and bulging to some veins in her right leg. She underwent injection by Dr. Carmell Austria (? Vascular?), and this has improved her symptoms.  All other systems were reviewed with the patient and are negative.  MEDICAL HISTORY:  Past Medical History:  Diagnosis Date   Allergy    Arthritis    wrist, shoulder    Cancer ([place]) 08/2019   left breast IDC   Diverticulitis    Endometrial polyp    Family history of breast cancer    GERD (gastroesophageal reflux disease)    occasional - diet controlled    Hx of adenomatous colonic polyps    Hyperlipidemia    IBS (irritable bowel syndrome)    Missed abortion    x 1 - resolved - no surgery   Osteoporosis    SVD (spontaneous vaginal delivery)    x 1   Transient vision disturbance 2019   was evaluated by ED, no findings   Vitamin D deficiency     SURGICAL HISTORY: Past Surgical History:  Procedure Laterality Date   BREAST EXCISIONAL BIOPSY Right 08/2019   Benign, lesion    BREAST LUMPECTOMY Left 09/2019   BREAST LUMPECTOMY WITH RADIOACTIVE SEED AND SENTINEL LYMPH NODE BIOPSY Left 10/06/2019   Procedure: LEFT BREAST LUMPECTOMY  X2 WITH RADIOACTIVE SEED X2  AND SENTINEL LYMPH NODE BIOPSY;  Surgeon: Jovita Kussmaul, MD;  Location: [place];  Service: General;  Laterality: Left;   BREAST LUMPECTOMY WITH RADIOACTIVE SEED LOCALIZATION Right 10/06/2019   Procedure: RIGHT BREAST LUMPECTOMY WITH RADIOACTIVE SEED LOCALIZATION;  Surgeon: Jovita Kussmaul, MD;  Location: [place];  Service: General;  Laterality: Right;   BRONCHIAL BIOPSY  12/27/2020   Procedure: BRONCHIAL BIOPSIES;  Surgeon: Garner Nash, DO;  Location: [place] [facility];  Service: Pulmonary;;   BRONCHIAL BRUSHINGS  12/27/2020   Procedure: BRONCHIAL BRUSHINGS;  Surgeon: Garner Nash, DO;  Location: [place] [facility];  Service: Pulmonary;;   BRONCHIAL NEEDLE ASPIRATION BIOPSY  12/27/2020   Procedure: BRONCHIAL NEEDLE ASPIRATION BIOPSIES;  Surgeon: Garner Nash, DO;  Location: [place] [facility];  Service: Pulmonary;;   BRONCHIAL WASHINGS  12/27/2020   Procedure: BRONCHIAL WASHINGS;  Surgeon: Garner Nash, DO;  Location: [place] [facility];  Service: Pulmonary;;   COLONOSCOPY     DIAGNOSTIC LAPAROSCOPY     cysts   DILATATION & CURETTAGE/HYSTEROSCOPY WITH TRUECLEAR N/A 10/31/2013   Procedure: DILATATION & CURETTAGE/HYSTEROSCOPY WITH TRUCLEAR;  Surgeon: Marylynn Pearson, MD;  Location: [facility];  Service: Gynecology;  Laterality: N/A;   DILATION AND CURETTAGE OF  UTERUS     MASS EXCISION Left 11/06/2019   Procedure: EXCISION NIPPLE AND AREOLA  MARGIN LEFT BREAST;  Surgeon: Jovita Kussmaul, MD;  Location: [place];  Service: General;  Laterality: Left;   TONSILLECTOMY  1954   VIDEO BRONCHOSCOPY WITH ENDOBRONCHIAL NAVIGATION Bilateral 12/27/2020   Procedure: VIDEO BRONCHOSCOPY WITH ENDOBRONCHIAL NAVIGATION;  Surgeon: Garner Nash, DO;  Location: [place];  Service: Pulmonary;  Laterality: Bilateral;   VIDEO BRONCHOSCOPY WITH ENDOBRONCHIAL ULTRASOUND Bilateral 12/27/2020   Procedure: VIDEO BRONCHOSCOPY WITH ENDOBRONCHIAL ULTRASOUND;  Surgeon: Garner Nash, DO;  Location: [place];  Service: Pulmonary;  Laterality: Bilateral;   WISDOM TOOTH EXTRACTION      I have reviewed the social history and family history with the patient and they are unchanged from previous note.  ALLERGIES:  is allergic to taxotere [docetaxel], abraxane [paclitaxel protein-bound part], crestor [rosuvastatin calcium], lipitor [atorvastatin calcium], nsaids, pravastatin,  and sulfonamide derivatives.  MEDICATIONS:  Current Outpatient Medications  Medication Sig Dispense Refill   acetaminophen (TYLENOL) 500 MG tablet Take 500 mg by mouth every 6 (six) hours as needed for headache or mild pain. (Patient not taking: Reported on 04/23/2021)     clidinium-chlordiazePOXIDE (LIBRAX) 5-2.5 MG capsule Take 1 capsule by mouth daily as needed (IBS).     denosumab (PROLIA) 60 MG/ML SOSY injection Inject 60 mg into the skin every 6 (six) months.     esomeprazole (NEXIUM) 20 MG capsule Take 20 mg by mouth daily as needed (acid reflux).  (Patient not taking: Reported on 04/23/2021)     loratadine (CLARITIN) 10 MG tablet Take 10 mg by mouth daily.     Polyethyl Glycol-Propyl Glycol (LUBRICANT EYE DROPS) 0.4-0.3 % SOLN Place 1 drop into both eyes 3 (three) times daily as needed (dry/irritated eyes).     Vitamin D, Ergocalciferol, (DRISDOL) 1.25 MG (50000 UNIT) CAPS capsule  TAKE 1 CAPSULE (50,000 UNITS TOTAL) BY MOUTH EVERY 7 (SEVEN) DAYS. (Patient not taking: No sig reported) 12 capsule 1   No current facility-administered medications for this visit.    PHYSICAL EXAMINATION: ECOG PERFORMANCE STATUS: 0 - Asymptomatic  Vitals:   05/16/21 0832  BP: (!) 151/66  Pulse: 76  Resp: 18  Temp: 98 F (36.7 C)  SpO2: 93%   Filed Weights   05/16/21 0832  Weight: 176 lb 8 oz (80.1 kg)    GENERAL:alert, no distress and comfortable SKIN: skin color, texture, turgor are normal, no rashes or significant lesions; moles present on chest EYES: normal, Conjunctiva are pink and non-injected, sclera clear  NECK: supple, thyroid normal size, non-tender, without nodularity LYMPH:  no palpable lymphadenopathy in the cervical, axillary  LUNGS: clear to auscultation and percussion with normal breathing effort HEART: regular rate & rhythm and no murmurs and no lower extremity edema ABDOMEN:abdomen soft, non-tender and normal bowel sounds Musculoskeletal:no cyanosis of digits and no clubbing  NEURO: alert & oriented x 3 with fluent speech, no focal motor/sensory deficits BREAST: No palpable mass, nodules or adenopathy bilaterally. Breast exam benign.  LABORATORY DATA:  I have reviewed the data as listed CBC Latest Ref Rng & Units 05/16/2021 03/17/2021 01/13/2021  WBC 4.0 - 10.5 K/uL 6.1 6.4 7.7  Hemoglobin 12.0 - 15.0 g/dL 12.3 12.2 10.8(L)  Hematocrit 36.0 - 46.0 % 38.5 39.0 36.3  Platelets 150 - 400 K/uL 298 322 381     CMP Latest Ref Rng & Units 05/16/2021 03/17/2021 01/13/2021  Glucose 70 - 99 mg/dL 100(H) 111(H) 158(H)  BUN 8 - 23 mg/dL 19 17 17   Creatinine 0.44 - 1.00 mg/dL 0.86 0.82 0.93  Sodium 135 - 145 mmol/L 138 141 139  Potassium 3.5 - 5.1 mmol/L 4.2 4.9 4.3  Chloride 98 - 111 mmol/L 105 105 106  CO2 22 - 32 mmol/L 27 27 26   Calcium 8.9 - 10.3 mg/dL 8.7(L) 9.0 9.1  Total Protein 6.5 - 8.1 g/dL 6.7 6.8 6.7  Total Bilirubin 0.3 - 1.2 mg/dL 0.3 0.3 <0.2(L)   Alkaline Phos 38 - 126 U/L 69 88 79  AST 15 - 41 U/L 12(L) 11(L) 9(L)  ALT 0 - 44 U/L 11 6 8       RADIOGRAPHIC STUDIES: I have personally reviewed the radiological images as listed and agreed with the findings in the report. No results found.   ASSESSMENT & PLAN:  Shelly [name] is a 75 y.o. female with   1. Multifocal infiltrates in bilateral lungs, ?  Organizing pneumonia -Her 09/03/20 CT chest showed 2.2 x 1.8 cm area of mixed density in the left upper lobe with surrounding area of groundglass changes. This could be related to her previous left breast radiation or inflammatory changes, although malignancy is not ruled out but less likely. She is asymptomatic, no clinical concern for infection. -Her CT chest from 12/04/20 shows Marked interval worsening of parenchymal disease now involving areas of the LEFT lower lobe, lingula, RIGHT upper lobe and with nodular opacity in the RIGHT lower lobe. Findings could be seen in the setting of COVID-19 infection or PNA, or interstitial disease  -She notes she was exposed to [place] by son-in-law in 10/2020. She tested negative around that time but felt bad for about 2 weeks.  -She was seen by Dr Valeta Harms and underwent bronchoscopy on 12/27/20. Cytology (from brush, lavage and FNA of LLL and station 7 node) Results was negative for malignancy except LLL FNA showed atypical cells.  -Chest CT 04/16/21 showed: waxing and waning appearance of multifocal airspace disease, with complete resolution of previous abnormalities and new consolidation within right upper and lower lobes, overall suggesting underlying organizing pneumonia with indeterminate etiology. I reviewed the results with her today. -Physical exam today showed nonremarkable lungs -She will have repeat CT and f/u with Dr. Valeta Harms in one year.   2. Cancer of central portion of left female breast, Stage IA, c(T1bN0M0), ER/PR+, HER2-, Grade II, pT1cN1aM0, stage IA  -She was diagnosed in 08/2019. She  underwent B/l breast lumpectomy and SLNB with Dr. Marlou Starks on 10/06/19. Due to positive margins and 1/3 positive LNs she underwent re-excisional surgery on 11/06/19 which showed DCIS only, margins negative. She has high risk for recurrence based on Mammaprint.  -She tried adjuvant chemo with TC and Abraxane but could not tolerate either. She did complete adjuvant RT  with Dr Isidore Moos 01/25/20-03/06/20.  -She did not want to pursue Genetic testing -She took antiestrogen therapy (Tamoxifen Exemestane) over 2 years 05/2020-07/2020 and stopped due to poor toleration. -From a breast cancer standpoint, she is clinically doing well. Physical exam benign. -She will be due for her next mammogram in 08/2021 -Continue surveillance. F/u in 4 months    3. Osteoporosis, Vitamin D deficiency -Diagnosed prior to 2007 in [place]. Last DEXA in 06/2015 (-3.2 at [facility]).  -On Prolia injections every 6 months since 2017-2018. Was restarted on 01/30/20 in our clinic and completed 5 years of treatment 01/2021. -Continue OTC Vit D and Tums (for calcium)   4. IBS, Diarrhea and Acid Reflux -history of IBS, noticed worsening symptoms with the high dose vit D. I recommended, now that she has completed prolia therapy, she can return to standard OTC dose. -Her BM are controlled on Librax and Nexium and imodium. No recent diarrhea     PLAN:  -proceed with colonoscopy next week with [facility] -Lab and F/u in 4 months  -CT chest and f/u with Dr. Valeta Harms in 1 year -Mammogram in October 2022    No problem-specific Assessment & Plan notes found for this encounter.   Orders Placed This Encounter  Procedures   MM DIAG BREAST TOMO BILATERAL    Standing Status:   Future    Standing Expiration Date:   05/16/2022    Order Specific Question:   Reason for Exam (SYMPTOM  OR DIAGNOSIS REQUIRED)    Answer:   screening    Order Specific Question:   Preferred imaging location?    Answer:   [facility]   All  questions were answered.  The patient knows to call the clinic with any problems, questions or concerns. No barriers to learning was detected. The total time spent in the appointment was 30 minutes.     Truitt Merle, MD 05/16/2021   I, Wilburn Mylar, am acting as scribe for Truitt Merle, MD.   I have reviewed the above documentation for accuracy and completeness, and I agree with the above.

## 2021-05-20 ENCOUNTER — Ambulatory Visit (AMBULATORY_SURGERY_CENTER): Payer: Medicare Other | Admitting: Gastroenterology

## 2021-05-20 ENCOUNTER — Other Ambulatory Visit: Payer: Self-pay

## 2021-05-20 ENCOUNTER — Encounter: Payer: Self-pay | Admitting: Gastroenterology

## 2021-05-20 VITALS — BP 147/57 | HR 64 | Temp 97.1°F | Resp 15 | Ht 65.0 in | Wt 173.4 lb

## 2021-05-20 DIAGNOSIS — D128 Benign neoplasm of rectum: Secondary | ICD-10-CM

## 2021-05-20 DIAGNOSIS — Z86010 Personal history of colonic polyps: Secondary | ICD-10-CM | POA: Diagnosis not present

## 2021-05-20 DIAGNOSIS — K635 Polyp of colon: Secondary | ICD-10-CM | POA: Diagnosis not present

## 2021-05-20 DIAGNOSIS — K621 Rectal polyp: Secondary | ICD-10-CM | POA: Diagnosis not present

## 2021-05-20 DIAGNOSIS — D124 Benign neoplasm of descending colon: Secondary | ICD-10-CM | POA: Diagnosis not present

## 2021-05-20 DIAGNOSIS — D120 Benign neoplasm of cecum: Secondary | ICD-10-CM

## 2021-05-20 DIAGNOSIS — D122 Benign neoplasm of ascending colon: Secondary | ICD-10-CM

## 2021-05-20 MED ORDER — SODIUM CHLORIDE 0.9 % IV SOLN
500.0000 mL | Freq: Once | INTRAVENOUS | Status: DC
Start: 2021-05-20 — End: 2021-05-20

## 2021-05-20 NOTE — Op Note (Signed)
[facility] Patient Name: Shelly [name] Procedure Date: 05/20/2021 8:55 AM MRN: [id number] Endoscopist: Thornton Park MD, MD Age: 75 Referring MD:  Date of Birth: 03/06/1946 Gender: Female Account #: [id number] Procedure:                Colonoscopy Indications:              Surveillance: Personal history of adenomatous                            polyps on last colonoscopy > 5 years ago                           Tubular adenoma and hyperplastic polyp on                            colonoscopy 2010                           Cecal tubular adenoma on colonoscopy 2015                           Mother with colon polyps                           No known family history of colon cancer Medicines:                Monitored Anesthesia Care Procedure:                Pre-Anesthesia Assessment:                           - Prior to the procedure, a History and Physical                            was performed, and patient medications and                            allergies were reviewed. The patient's tolerance of                            previous anesthesia was also reviewed. The risks                            and benefits of the procedure and the sedation                            options and risks were discussed with the patient.                            All questions were answered, and informed consent                            was obtained. Prior Anticoagulants: The patient has                            taken no  previous anticoagulant or antiplatelet                            agents. ASA Grade Assessment: II - A patient with                            mild systemic disease. After reviewing the risks                            and benefits, the patient was deemed in                            satisfactory condition to undergo the procedure.                           After obtaining informed consent, the colonoscope                            was passed under direct  vision. Throughout the                            procedure, the patient's blood pressure, pulse, and                            oxygen saturations were monitored continuously. The                            CF [id number] #[id number] was introduced through the anus                            and advanced to the 3 cm into the ileum. A second                            forward view of the right colon was performed. The                            colonoscopy was performed without difficulty. The                            patient tolerated the procedure well. The quality                            of the bowel preparation was good. The terminal                            ileum, ileocecal valve, appendiceal orifice, and                            rectum were photographed. Scope In: 9:04:33 AM Scope Out: 9:22:32 AM Scope Withdrawal Time: 0 hours 12 minutes 54 seconds  Total Procedure Duration: 0 hours 17 minutes 59 seconds  Findings:                 The perianal and digital rectal examinations  were                            normal.                           Non-bleeding internal hemorrhoids were found.                           Multiple small and large-mouthed diverticula were                            found in the sigmoid colon and descending colon.                           A 2 mm polyp was found in the rectum. The polyp was                            sessile. The polyp was removed with a cold snare.                            Resection and retrieval were complete. Estimated                            blood loss was minimal.                           A 2 mm polyp was found in the descending colon. The                            polyp was sessile. The polyp was removed with a                            cold snare. Resection and retrieval were complete.                            Estimated blood loss was minimal.                           A 2 mm polyp was found in the ascending colon. The                             polyp was sessile. The polyp was removed with a                            cold snare. Resection and retrieval were complete.                            Estimated blood loss was minimal.                           A 3 mm polyp was found in the proximal ascending  colon. The polyp was sessile. The polyp was removed                            with a cold snare. Resection and retrieval were                            complete. Estimated blood loss was minimal.                           A 5 mm polyp was found in the cecum. The polyp was                            sessile. The polyp was removed with a cold snare.                            Resection and retrieval were complete. Estimated                            blood loss was minimal.                           The exam was otherwise without abnormality on                            direct and retroflexion views. Complications:            No immediate complications. Estimated blood loss:                            Minimal. Estimated Blood Loss:     Estimated blood loss was minimal. Impression:               - Non-bleeding internal hemorrhoids.                           - Diverticulosis in the sigmoid colon and in the                            descending colon.                           - One 2 mm polyp in the rectum, removed with a cold                            snare. Resected and retrieved.                           - One 2 mm polyp in the descending colon, removed                            with a cold snare. Resected and retrieved.                           - One 2 mm polyp in the ascending colon, removed  with a cold snare. Resected and retrieved.                           - One 3 mm polyp in the proximal ascending colon,                            removed with a cold snare. Resected and retrieved.                           - One 5 mm polyp in the cecum, removed with  a cold                            snare. Resected and retrieved.                           - The examination was otherwise normal on direct                            and retroflexion views. Recommendation:           - Patient has a contact number available for                            emergencies. The signs and symptoms of potential                            delayed complications were discussed with the                            patient. Return to normal activities tomorrow.                            Written discharge instructions were provided to the                            patient.                           - Continue present medications.                           - Await pathology results.                           - Repeat colonoscopy date to be determined after                            pending pathology results are reviewed for                            surveillance, if it is clinically appropriate at                            that time.                           -  Follow a high fiber diet. Drink at least 64                            ounces of water daily. Add a daily stool bulking                            agent such as psyllium (an exampled would be                            Metamucil).                           - Emerging evidence supports eating a diet of                            fruits, vegetables, grains, calcium, and yogurt                            while reducing red meat and alcohol may reduce the                            risk of colon cancer.                           - Thank you for allowing me to be involved in your                            colon cancer prevention. Thornton Park MD, MD 05/20/2021 9:30:34 AM This report has been signed electronically.

## 2021-05-20 NOTE — Progress Notes (Signed)
Called to room to assist during endoscopic procedure.  Patient ID and intended procedure confirmed with present staff. Received instructions for my participation in the procedure from the performing physician.  

## 2021-05-20 NOTE — Progress Notes (Signed)
pt tolerated well. VSS. awake and to recovery. Report given to RN.  

## 2021-05-20 NOTE — Patient Instructions (Signed)
YOU HAD AN ENDOSCOPIC PROCEDURE TODAY AT [facility]:   Refer to the procedure report that was given to you for any specific questions about what was found during the examination.  If the procedure report does not answer your questions, please call your gastroenterologist to clarify.  If you requested that your care partner not be given the details of your procedure findings, then the procedure report has been included in a sealed envelope for you to review at your convenience later.  YOU SHOULD EXPECT: Some feelings of bloating in the abdomen. Passage of more gas than usual.  Walking can help get rid of the air that was put into your GI tract during the procedure and reduce the bloating. If you had a lower endoscopy (such as a colonoscopy or flexible sigmoidoscopy) you may notice spotting of blood in your stool or on the toilet paper. If you underwent a bowel prep for your procedure, you may not have a normal bowel movement for a few days.  Please Note:  You might notice some irritation and congestion in your nose or some drainage.  This is from the oxygen used during your procedure.  There is no need for concern and it should clear up in a day or so.  SYMPTOMS TO REPORT IMMEDIATELY:  Following lower endoscopy (colonoscopy or flexible sigmoidoscopy):  Excessive amounts of blood in the stool  Significant tenderness or worsening of abdominal pains  Swelling of the abdomen that is new, acute  Fever of 100F or higher   For urgent or emergent issues, a gastroenterologist can be reached at any hour by calling [phone]. Do not use MyChart messaging for urgent concerns.    DIET:  We do recommend a small meal at first, but then you may proceed to your regular diet.  Drink plenty of fluids but you should avoid alcoholic beverages for 24 hours.  ACTIVITY:  You should plan to take it easy for the rest of today and you should NOT DRIVE or use heavy machinery until tomorrow (because  of the sedation medicines used during the test).    FOLLOW UP: Our staff will call the number listed on your records 48-72 hours following your procedure to check on you and address any questions or concerns that you may have regarding the information given to you following your procedure. If we do not reach you, we will leave a message.  We will attempt to reach you two times.  During this call, we will ask if you have developed any symptoms of COVID 19. If you develop any symptoms (ie: fever, flu-like symptoms, shortness of breath, cough etc.) before then, please call [phone].  If you test positive for Covid 19 in the 2 weeks post procedure, please call and report this information to [place].    If any biopsies were taken you will be contacted by phone or by letter within the next 1-3 weeks.  Please call us at [phone] if you have not heard about the biopsies in 3 weeks.    SIGNATURES/CONFIDENTIALITY: You and/or your care partner have signed paperwork which will be entered into your electronic medical record.  These signatures attest to the fact that that the information above on your After Visit Summary has been reviewed and is understood.  Full responsibility of the confidentiality of this discharge information lies with you and/or your care-partner.    Resume medications. See report for recommendations. Information given on polyps and high fiber diet. Call office  to schedule office visit for future concerns.

## 2021-05-20 NOTE — Progress Notes (Signed)
Pt's states no medical or surgical changes since previsit or office visit. 

## 2021-05-21 ENCOUNTER — Other Ambulatory Visit: Payer: Self-pay | Admitting: "Endocrinology

## 2021-05-21 ENCOUNTER — Other Ambulatory Visit: Payer: Self-pay | Admitting: Hematology

## 2021-05-22 ENCOUNTER — Telehealth: Payer: Self-pay | Admitting: *Deleted

## 2021-05-22 NOTE — Telephone Encounter (Signed)
  Follow up Call-  Call back number 05/20/2021  Post procedure Call Back phone  # [phone]  Permission to leave phone message Yes  Some recent data might be hidden     Patient questions:  Do you have a fever, pain , or abdominal swelling? No. Pain Score  0 *  Have you tolerated food without any problems? Yes.    Have you been able to return to your normal activities? Yes.    Do you have any questions about your discharge instructions: Diet   No. Medications  No. Follow up visit  No.  Do you have questions or concerns about your Care? No.  Actions: * If pain score is 4 or above: No action needed, pain <4.  Have you developed a fever since your procedure? no  2.   Have you had an respiratory symptoms (SOB or cough) since your procedure? no  3.   Have you tested positive for COVID 19 since your procedure no  4.   Have you had any family members/close contacts diagnosed with the COVID 19 since your procedure?  no   If yes to any of these questions please route to Joylene John, RN and Joella Prince, RN

## 2021-05-26 DIAGNOSIS — I8312 Varicose veins of left lower extremity with inflammation: Secondary | ICD-10-CM | POA: Diagnosis not present

## 2021-05-28 DIAGNOSIS — I8312 Varicose veins of left lower extremity with inflammation: Secondary | ICD-10-CM | POA: Diagnosis not present

## 2021-05-29 ENCOUNTER — Encounter: Payer: Self-pay | Admitting: Gastroenterology

## 2021-06-02 DIAGNOSIS — I83811 Varicose veins of right lower extremities with pain: Secondary | ICD-10-CM | POA: Diagnosis not present

## 2021-06-02 DIAGNOSIS — I8311 Varicose veins of right lower extremity with inflammation: Secondary | ICD-10-CM | POA: Diagnosis not present

## 2021-07-02 ENCOUNTER — Other Ambulatory Visit: Payer: Self-pay | Admitting: Hematology

## 2021-07-03 ENCOUNTER — Telehealth: Payer: Self-pay

## 2021-07-03 MED ORDER — CILIDINIUM-CHLORDIAZEPOXIDE 2.5-5 MG PO CAPS: 1.0000 | ORAL_CAPSULE | Freq: Every day | ORAL | 0 refills | Status: DC | PRN

## 2021-07-03 NOTE — Telephone Encounter (Signed)
Message sent to Dr Elease Hashimoto CMA for advise

## 2021-07-03 NOTE — Addendum Note (Signed)
Addended by: Eulas Post on: 07/03/2021 02:18 PM   Modules accepted: Orders

## 2021-07-15 ENCOUNTER — Telehealth: Payer: Self-pay | Admitting: Family Medicine

## 2021-07-15 MED ORDER — CILIDINIUM-CHLORDIAZEPOXIDE 2.5-5 MG PO CAPS: 1.0000 | ORAL_CAPSULE | Freq: Every day | ORAL | 0 refills | Status: DC | PRN

## 2021-07-15 NOTE — Telephone Encounter (Signed)
Rx printed by mistake Last filled 07/03/2021 Last OV 08/14/2020  Ok to fill?

## 2021-07-15 NOTE — Telephone Encounter (Signed)
Pt call and want a refill on clidinium-chlordiazePOXIDE (LIBRAX) 5-2.5 MG capsule sent to  [id number]- [place], [place] - [address] Phone:  [phone] Fax:  [phone]

## 2021-07-26 DIAGNOSIS — Z23 Encounter for immunization: Secondary | ICD-10-CM | POA: Diagnosis not present

## 2021-07-30 ENCOUNTER — Telehealth: Payer: Self-pay

## 2021-07-30 NOTE — Telephone Encounter (Signed)
I called and left patient a message to call the office back to schedule her Prolia injection.   Due on or after 08/06/21, estimated charge is $0.

## 2021-07-31 NOTE — Telephone Encounter (Signed)
PT called to advise that her Cancer Dr told her that she should not take anymore Prolia injections but instead take Calcium and Vit D. Please note and advise.

## 2021-08-01 NOTE — Telephone Encounter (Signed)
Spoke with the patient. She is aware we received her message and have updated her chart.   The patient stated she started the vitamin d and calcium and had loose stools and an upset stomach the rest of the day. She stated she is afraid this will make her sick again and ask for suggestions.

## 2021-08-01 NOTE — Telephone Encounter (Signed)
Left message for patient to call back  

## 2021-08-01 NOTE — Addendum Note (Signed)
Addended by: Rebecca Eaton on: 08/01/2021 08:48 AM   Modules accepted: Orders

## 2021-08-04 NOTE — Telephone Encounter (Signed)
Left message for patient to call back  

## 2021-08-05 NOTE — Telephone Encounter (Signed)
PT called to return the missed call from [place]. States to please call her mobile #.

## 2021-08-06 NOTE — Telephone Encounter (Signed)
Pt informed of the message and verbalized understanding, Pt stated that she would try a different medication and she would contact the office if she has any problems.

## 2021-08-10 ENCOUNTER — Other Ambulatory Visit: Payer: Self-pay | Admitting: Family Medicine

## 2021-08-11 ENCOUNTER — Telehealth: Payer: Self-pay | Admitting: Family Medicine

## 2021-08-11 NOTE — Telephone Encounter (Signed)
Last filled 07/15/2021 Last OV 08/14/2020  Ok to fill?

## 2021-08-11 NOTE — Telephone Encounter (Signed)
Patient wants to speak to someone about the issues she having with prolia.  Patinet could be contacted at [phone].  Please advise.

## 2021-08-12 NOTE — Telephone Encounter (Signed)
I left patient a voicemail to return my call.   Per last Prolia note from 9/14 - pt is approved for her September injection with estimated cost of $0. I had left her a message to call back to schedule her injection.   She returned the call on 9/15 and stated that her cancer doctor had stopped the injections.

## 2021-08-12 NOTE — Telephone Encounter (Signed)
I spoke with patient. She was concerned because she was having some joint pain yesterday. She is going to wait until her physical with Dr. Elease Hashimoto on 10/4 and discuss the prolia then.

## 2021-08-15 ENCOUNTER — Ambulatory Visit (INDEPENDENT_AMBULATORY_CARE_PROVIDER_SITE_OTHER): Payer: Medicare Other

## 2021-08-15 DIAGNOSIS — Z0000 Encounter for general adult medical examination without abnormal findings: Secondary | ICD-10-CM | POA: Diagnosis not present

## 2021-08-15 NOTE — Progress Notes (Signed)
Subjective:   Shelly [name] is a 75 y.o. female who presents for Medicare Annual (Subsequent) preventive examination.  Patient preferred telephone visit . I connected with Taliah Porche today by telephone and verified that I am speaking with the correct person using two identifiers. Location patient: home Location provider: work Persons participating in the virtual visit: patient, provider.   I discussed the limitations, risks, security and privacy concerns of performing an evaluation and management service by telephone and the availability of in person appointments. I also discussed with the patient that there may be a patient responsible charge related to this service. The patient expressed understanding and verbally consented to this telephonic visit.    Interactive audio and video telecommunications were attempted between this provider and patient, however failed, due to patient having technical difficulties OR patient did not have access to video capability.  We continued and completed visit with audio only.    Review of Systems    N/a       Objective:    There were no vitals filed for this visit. There is no height or weight on file to calculate BMI.  Advanced Directives 12/27/2020 03/06/2020 02/12/2020 01/16/2020 01/03/2020 11/06/2019 10/31/2019  Does Patient Have a Medical Advance Directive? Yes Yes Yes Yes Yes Yes Yes  Type of Advance Directive Living will;Healthcare Power of [place] will [place];Living will [place];Living will [place];Living will -  Does patient want to make changes to medical advance directive? - - - - No - Patient declined No - Patient declined -  Copy of English in Chart? - - - No - copy requested No - copy requested (No Data) -    Current Medications (verified) Outpatient Encounter Medications as of 08/15/2021  Medication Sig   acetaminophen (TYLENOL) 500 MG  tablet Take 500 mg by mouth every 6 (six) hours as needed for headache or mild pain. (Patient not taking: No sig reported)   clidinium-chlordiazePOXIDE (LIBRAX) 5-2.5 MG capsule TAKE 1 CAPSULE BY MOUTH DAILY AS NEEDED (IBS).   esomeprazole (NEXIUM) 20 MG capsule Take 20 mg by mouth daily as needed (acid reflux).  (Patient not taking: No sig reported)   loratadine (CLARITIN) 10 MG tablet Take 10 mg by mouth daily.   Polyethyl Glycol-Propyl Glycol (LUBRICANT EYE DROPS) 0.4-0.3 % SOLN Place 1 drop into both eyes 3 (three) times daily as needed (dry/irritated eyes).   Vitamin D, Ergocalciferol, (DRISDOL) 1.25 MG (50000 UNIT) CAPS capsule TAKE 1 CAPSULE (50,000 UNITS TOTAL) BY MOUTH EVERY 7 (SEVEN) DAYS. (Patient not taking: No sig reported)   [DISCONTINUED] omeprazole (PRILOSEC) 40 MG capsule Take 40 mg by mouth daily.   [DISCONTINUED] pravastatin (PRAVACHOL) 40 MG tablet Take 1 tablet (40 mg total) by mouth every evening.   [DISCONTINUED] prochlorperazine (COMPAZINE) 10 MG tablet Take 1 tablet (10 mg total) by mouth every 6 (six) hours as needed (Nausea or vomiting).   No facility-administered encounter medications on file as of 08/15/2021.    Allergies (verified) Taxotere [docetaxel], Abraxane [paclitaxel protein-bound part], Crestor [rosuvastatin calcium], Lipitor [atorvastatin calcium], Nsaids, Pravastatin, and Sulfonamide derivatives   History: Past Medical History:  Diagnosis Date   Allergy    Arthritis    wrist, shoulder    Cancer ([place]) 08/2019   left breast IDC   Diverticulitis    Endometrial polyp    Family history of breast cancer    GERD (gastroesophageal reflux disease)    occasional -  diet controlled   Hx of adenomatous colonic polyps    Hyperlipidemia    IBS (irritable bowel syndrome)    Missed abortion    x 1 - resolved - no surgery   Osteoporosis    SVD (spontaneous vaginal delivery)    x 1   Transient vision disturbance 2019   was evaluated by ED, no findings    Vitamin D deficiency    Past Surgical History:  Procedure Laterality Date   BREAST EXCISIONAL BIOPSY Right 08/2019   Benign, lesion    BREAST LUMPECTOMY Left 09/2019   BREAST LUMPECTOMY WITH RADIOACTIVE SEED AND SENTINEL LYMPH NODE BIOPSY Left 10/06/2019   Procedure: LEFT BREAST LUMPECTOMY  X2 WITH RADIOACTIVE SEED X2  AND SENTINEL LYMPH NODE BIOPSY;  Surgeon: Jovita Kussmaul, MD;  Location: [place] [facility];  Service: General;  Laterality: Left;   BREAST LUMPECTOMY WITH RADIOACTIVE SEED LOCALIZATION Right 10/06/2019   Procedure: RIGHT BREAST LUMPECTOMY WITH RADIOACTIVE SEED LOCALIZATION;  Surgeon: Jovita Kussmaul, MD;  Location: [facility];  Service: General;  Laterality: Right;   BRONCHIAL BIOPSY  12/27/2020   Procedure: BRONCHIAL BIOPSIES;  Surgeon: Garner Nash, DO;  Location: [facility];  Service: Pulmonary;;   BRONCHIAL BRUSHINGS  12/27/2020   Procedure: BRONCHIAL BRUSHINGS;  Surgeon: Garner Nash, DO;  Location: [facility];  Service: Pulmonary;;   BRONCHIAL NEEDLE ASPIRATION BIOPSY  12/27/2020   Procedure: BRONCHIAL NEEDLE ASPIRATION BIOPSIES;  Surgeon: Garner Nash, DO;  Location: [facility];  Service: Pulmonary;;   BRONCHIAL WASHINGS  12/27/2020   Procedure: BRONCHIAL WASHINGS;  Surgeon: Garner Nash, DO;  Location: [facility];  Service: Pulmonary;;   COLONOSCOPY     DIAGNOSTIC LAPAROSCOPY     cysts   DILATATION & CURETTAGE/HYSTEROSCOPY WITH TRUECLEAR N/A 10/31/2013   Procedure: DILATATION & CURETTAGE/HYSTEROSCOPY WITH TRUCLEAR;  Surgeon: Marylynn Pearson, MD;  Location: [facility];  Service: Gynecology;  Laterality: N/A;   DILATION AND CURETTAGE OF UTERUS     MASS EXCISION Left 11/06/2019   Procedure: EXCISION NIPPLE AND AREOLA  MARGIN LEFT BREAST;  Surgeon: Jovita Kussmaul, MD;  Location: [facility];  Service: General;  Laterality: Left;   TONSILLECTOMY  1952   VIDEO BRONCHOSCOPY WITH ENDOBRONCHIAL NAVIGATION Bilateral  12/27/2020   Procedure: VIDEO BRONCHOSCOPY WITH ENDOBRONCHIAL NAVIGATION;  Surgeon: Garner Nash, DO;  Location: [facility];  Service: Pulmonary;  Laterality: Bilateral;   VIDEO BRONCHOSCOPY WITH ENDOBRONCHIAL ULTRASOUND Bilateral 12/27/2020   Procedure: VIDEO BRONCHOSCOPY WITH ENDOBRONCHIAL ULTRASOUND;  Surgeon: Garner Nash, DO;  Location: [facility];  Service: Pulmonary;  Laterality: Bilateral;   WISDOM TOOTH EXTRACTION     Family History  Problem Relation Age of Onset   Hyperlipidemia Mother    Hypertension Mother    Diabetes Mother        type ll   Leukemia Mother 41   Colon polyps Mother    Lung cancer Father    Hyperlipidemia Father    Breast cancer Cousin 7       maternal first couin   Heart disease Other    Hypertension Other    Cancer Other    Depression Brother    Breast cancer Maternal Aunt 50   Uterine cancer Paternal Grandmother    Breast cancer Maternal Aunt 66   Lung cancer Maternal Grandfather    Cancer Maternal Grandfather        gastric cancer?   Cancer Maternal Uncle  unknown   Brain cancer Paternal Uncle    Throat cancer Paternal Uncle    Colon cancer Neg Hx    Esophageal cancer Neg Hx    Stomach cancer Neg Hx    Rectal cancer Neg Hx    Social History   Socioeconomic History   Marital status: Married    Spouse name: Not on file   Number of children: 1   Years of education: Not on file   Highest education level: Not on file  Occupational History   Not on file  Tobacco Use   Smoking status: Former    Packs/day: 0.10    Years: 20.00    Pack years: 2.00    Types: Cigarettes    Quit date: 05/15/2007    Years since quitting: 14.2   Smokeless tobacco: Never  Vaping Use   Vaping Use: Never used  Substance and Sexual Activity   Alcohol use: Yes    Alcohol/week: 14.0 standard drinks    Types: 14 Glasses of wine per week    Comment: 2 glasses wine every evening   Drug use: No   Sexual activity: Yes    Birth control/protection:  Post-menopausal  Other Topics Concern   Not on file  Social History Narrative   Lives with husband in 2 story house   Has one daughter, with 3 grandchildren, local, supportive   Enjoys gardening/walking outside with husband   Occasionally does home exercise calisthenics but not recently.   Well-balanced diet overall   Social Determinants of Health   Financial Resource Strain: Not on file  Food Insecurity: Not on file  Transportation Needs: Not on file  Physical Activity: Not on file  Stress: Not on file  Social Connections: Not on file    Tobacco Counseling Counseling given: Not Answered   Clinical Intake:                 Diabetic?no         Activities of Daily Living No flowsheet data found.  Patient Care Team: Eulas Post, MD as PCP - General (Family Medicine) Barbaraann Cao, [place] as Referring Physician (Optometry) Jovita Kussmaul, MD as Consulting Physician (General Surgery) Mauro Kaufmann, RN as Oncology Nurse Navigator Rockwell Germany, RN as Oncology Nurse Navigator Truitt Merle, MD as Consulting Physician (Hematology) Alla Feeling, NP as Nurse Practitioner (Nurse Practitioner) Eppie Gibson, MD as Attending Physician (Radiation Oncology)  Indicate any recent Medical Services you may have received from other than [facility] providers in the past year (date may be approximate).     Assessment:   This is a routine wellness examination for Michala.  Hearing/Vision screen No results found.  Dietary issues and exercise activities discussed:     Goals Addressed   None    Depression Screen PHQ 2/9 Scores 08/14/2020 02/10/2019 01/07/2018 06/14/2017 05/18/2016 10/15/2015 12/28/2013  PHQ - 2 Score 0 1 0 0 0 0 0  PHQ- 9 Score 0 5 - - - - -    Fall Risk Fall Risk  08/14/2020 02/10/2019 01/07/2018 10/21/2017 05/18/2016  Falls in the past year? 0 0 No No No  Comment - - - Emmi Telephone Survey: data to providers prior to load -  Risk for fall due to  : - History of fall(s) - - -  Follow up - Education provided;Falls prevention discussed - - -    FALL RISK PREVENTION PERTAINING TO THE HOME:  Any stairs in or around the home? Yes  If so, are there any without handrails? No  Home free of loose throw rugs in walkways, pet beds, electrical cords, etc? Yes  Adequate lighting in your home to reduce risk of falls? Yes   ASSISTIVE DEVICES UTILIZED TO PREVENT FALLS:  Life alert? No  Use of a cane, walker or w/c? No  Grab bars in the bathroom? No  Shower chair or bench in shower? No  Elevated toilet seat or a handicapped toilet? Yes    Cognitive Function: Normal cognitive status assessed by direct observation by this Nurse Health Advisor. No abnormalities found.   MMSE - Mini Mental State Exam 01/07/2018  Not completed: (No Data)        Immunizations Immunization History  Administered Date(s) Administered   Fluad Quad(high Dose 65+) 08/14/2020   Influenza Split 08/05/2011, 07/28/2012   Influenza, High Dose Seasonal PF 07/30/2015, 08/07/2016, 08/11/2017, 08/05/2018, 08/17/2019   Influenza,inj,Quad PF,6+ Mos 08/01/2013, 07/18/2014   Influenza-Unspecified 08/17/2019   PFIZER Comirnaty(Gray Top)Covid-19 Tri-Sucrose Vaccine 11/18/2019, 12/26/2019, 02/21/2021   PFIZER(Purple Top)SARS-COV-2 Vaccination 08/12/2020   Pneumococcal Conjugate-13 05/14/2014   Pneumococcal Polysaccharide-23 06/05/2011   Pneumococcal-Unspecified 04/30/2017   Td 12/15/2003   Tdap 05/14/2014   Zoster Recombinat (Shingrix) 08/22/2019   Zoster, Live 03/13/2013, 08/01/2019    TDAP status: Up to date  Flu Vaccine status: Up to date  Pneumococcal vaccine status: Up to date  Covid-19 vaccine status: Completed vaccines  Qualifies for Shingles Vaccine? Yes   Zostavax completed Yes   Shingrix Completed?: Yes  Screening Tests Health Maintenance  Topic Date Due   Zoster Vaccines- Shingrix (2 of 2) 10/17/2019   INFLUENZA VACCINE  06/16/2021   COVID-19  Vaccine (5 - Booster for Pfizer series) 06/23/2021   TETANUS/TDAP  05/14/2024   COLONOSCOPY (Pts 45-69yrs Insurance coverage will need to be confirmed)  05/20/2026   DEXA SCAN  Completed   Hepatitis C Screening  Completed   HPV VACCINES  Aged Out    Health Maintenance  Health Maintenance Due  Topic Date Due   Zoster Vaccines- Shingrix (2 of 2) 10/17/2019   INFLUENZA VACCINE  06/16/2021   COVID-19 Vaccine (5 - Booster for Pfizer series) 06/23/2021    Colorectal cancer screening: No longer required.   Mammogram status: Completed 08/19/2020. Repeat every year  Bone Density status: Completed 09/01/2018. Results reflect: Bone density results: OSTEOPENIA. Repeat every 5 years.  Lung Cancer Screening: (Low Dose CT Chest recommended if Age 62-80 years, 30 pack-year currently smoking OR have quit w/in 15years.) does not qualify.   Lung Cancer Screening Referral: n/a  Additional Screening:  Hepatitis C Screening: does not qualify; Completed 08/14/2020  Vision Screening: Recommended annual ophthalmology exams for early detection of glaucoma and other disorders of the eye. Is the patient up to date with their annual eye exam?  Yes  Who is the provider or what is the name of the office in which the patient attends annual eye exams? [facility]  If pt is not established with a provider, would they like to be referred to a provider to establish care? No .   Dental Screening: Recommended annual dental exams for proper oral hygiene  Community Resource Referral / Chronic Care Management: CRR required this visit?  No   CCM required this visit?  No      Plan:     I have personally reviewed and noted the following in the patient's chart:   Medical and social history Use of alcohol, tobacco or illicit drugs  Current medications and  supplements including opioid prescriptions.  Functional ability and status Nutritional status Physical activity Advanced directives List of other  physicians Hospitalizations, surgeries, and ER visits in previous 12 months Vitals Screenings to include cognitive, depression, and falls Referrals and appointments  In addition, I have reviewed and discussed with patient certain preventive protocols, quality metrics, and best practice recommendations. A written personalized care plan for preventive services as well as general preventive health recommendations were provided to patient.     Randel Pigg, LPN   5/99/2341   Nurse Notes: none

## 2021-08-15 NOTE — Patient Instructions (Signed)
Shelly [name] , Thank you for taking time to come for your Medicare Wellness Visit. I appreciate your ongoing commitment to your health goals. Please review the following plan we discussed and let me know if I can assist you in the future.   Screening recommendations/referrals: Colonoscopy: no longer required  Mammogram: 08/19/2020 Bone Density: 09/01/2018 Recommended yearly ophthalmology/optometry visit for glaucoma screening and checkup Recommended yearly dental visit for hygiene and checkup  Vaccinations: Influenza vaccine: due in fall 2022  Pneumococcal vaccine: completed series  Tdap vaccine: 05/14/2014 Shingles vaccine: completed series     Advanced directives: will provide copies   Conditions/risks identified: none   Next appointment: 08/19/2021  0930am  Dr.Burchette    Preventive Care 25 Years and Older, Female Preventive care refers to lifestyle choices and visits with your health care provider that can promote health and wellness. What does preventive care include? A yearly physical exam. This is also called an annual well check. Dental exams once or twice a year. Routine eye exams. Ask your health care provider how often you should have your eyes checked. Personal lifestyle choices, including: Daily care of your teeth and gums. Regular physical activity. Eating a healthy diet. Avoiding tobacco and drug use. Limiting alcohol use. Practicing safe sex. Taking low-dose aspirin every day. Taking vitamin and mineral supplements as recommended by your health care provider. What happens during an annual well check? The services and screenings done by your health care provider during your annual well check will depend on your age, overall health, lifestyle risk factors, and family history of disease. Counseling  Your health care provider may ask you questions about your: Alcohol use. Tobacco use. Drug use. Emotional well-being. Home and relationship well-being. Sexual  activity. Eating habits. History of falls. Memory and ability to understand (cognition). Work and work Statistician. Reproductive health. Screening  You may have the following tests or measurements: Height, weight, and BMI. Blood pressure. Lipid and cholesterol levels. These may be checked every 5 years, or more frequently if you are over 41 years old. Skin check. Lung cancer screening. You may have this screening every year starting at age 5 if you have a 30-pack-year history of smoking and currently smoke or have quit within the past 15 years. Fecal occult blood test (FOBT) of the stool. You may have this test every year starting at age 68. Flexible sigmoidoscopy or colonoscopy. You may have a sigmoidoscopy every 5 years or a colonoscopy every 10 years starting at age 47. Hepatitis C blood test. Hepatitis B blood test. Sexually transmitted disease (STD) testing. Diabetes screening. This is done by checking your blood sugar (glucose) after you have not eaten for a while (fasting). You may have this done every 1-3 years. Bone density scan. This is done to screen for osteoporosis. You may have this done starting at age 26. Mammogram. This may be done every 1-2 years. Talk to your health care provider about how often you should have regular mammograms. Talk with your health care provider about your test results, treatment options, and if necessary, the need for more tests. Vaccines  Your health care provider may recommend certain vaccines, such as: Influenza vaccine. This is recommended every year. Tetanus, diphtheria, and acellular pertussis (Tdap, Td) vaccine. You may need a Td booster every 10 years. Zoster vaccine. You may need this after age 60. Pneumococcal 13-valent conjugate (PCV13) vaccine. One dose is recommended after age 62. Pneumococcal polysaccharide (PPSV23) vaccine. One dose is recommended after age 71. Talk to your  health care provider about which screenings and vaccines  you need and how often you need them. This information is not intended to replace advice given to you by your health care provider. Make sure you discuss any questions you have with your health care provider. Document Released: 11/29/2015 Document Revised: 07/22/2016 Document Reviewed: 09/03/2015 Elsevier Interactive Patient Education  2017 [place] Prevention in the Home Falls can cause injuries. They can happen to people of all ages. There are many things you can do to make your home safe and to help prevent falls. What can I do on the outside of my home? Regularly fix the edges of walkways and driveways and fix any cracks. Remove anything that might make you trip as you walk through a door, such as a raised step or threshold. Trim any bushes or trees on the path to your home. Use bright outdoor lighting. Clear any walking paths of anything that might make someone trip, such as rocks or tools. Regularly check to see if handrails are loose or broken. Make sure that both sides of any steps have handrails. Any raised decks and porches should have guardrails on the edges. Have any leaves, snow, or ice cleared regularly. Use sand or salt on walking paths during winter. Clean up any spills in your garage right away. This includes oil or grease spills. What can I do in the bathroom? Use night lights. Install grab bars by the toilet and in the tub and shower. Do not use towel bars as grab bars. Use non-skid mats or decals in the tub or shower. If you need to sit down in the shower, use a plastic, non-slip stool. Keep the floor dry. Clean up any water that spills on the floor as soon as it happens. Remove soap buildup in the tub or shower regularly. Attach bath mats securely with double-sided non-slip rug tape. Do not have throw rugs and other things on the floor that can make you trip. What can I do in the bedroom? Use night lights. Make sure that you have a light by your bed that  is easy to reach. Do not use any sheets or blankets that are too big for your bed. They should not hang down onto the floor. Have a firm chair that has side arms. You can use this for support while you get dressed. Do not have throw rugs and other things on the floor that can make you trip. What can I do in the kitchen? Clean up any spills right away. Avoid walking on wet floors. Keep items that you use a lot in easy-to-reach places. If you need to reach something above you, use a strong step stool that has a grab bar. Keep electrical cords out of the way. Do not use floor polish or wax that makes floors slippery. If you must use wax, use non-skid floor wax. Do not have throw rugs and other things on the floor that can make you trip. What can I do with my stairs? Do not leave any items on the stairs. Make sure that there are handrails on both sides of the stairs and use them. Fix handrails that are broken or loose. Make sure that handrails are as long as the stairways. Check any carpeting to make sure that it is firmly attached to the stairs. Fix any carpet that is loose or worn. Avoid having throw rugs at the top or bottom of the stairs. If you do have throw rugs, attach them  to the floor with carpet tape. Make sure that you have a light switch at the top of the stairs and the bottom of the stairs. If you do not have them, ask someone to add them for you. What else can I do to help prevent falls? Wear shoes that: Do not have high heels. Have rubber bottoms. Are comfortable and fit you well. Are closed at the toe. Do not wear sandals. If you use a stepladder: Make sure that it is fully opened. Do not climb a closed stepladder. Make sure that both sides of the stepladder are locked into place. Ask someone to hold it for you, if possible. Clearly mark and make sure that you can see: Any grab bars or handrails. First and last steps. Where the edge of each step is. Use tools that help you  move around (mobility aids) if they are needed. These include: Canes. Walkers. Scooters. Crutches. Turn on the lights when you go into a dark area. Replace any light bulbs as soon as they burn out. Set up your furniture so you have a clear path. Avoid moving your furniture around. If any of your floors are uneven, fix them. If there are any pets around you, be aware of where they are. Review your medicines with your doctor. Some medicines can make you feel dizzy. This can increase your chance of falling. Ask your doctor what other things that you can do to help prevent falls. This information is not intended to replace advice given to you by your health care provider. Make sure you discuss any questions you have with your health care provider. Document Released: 08/29/2009 Document Revised: 04/09/2016 Document Reviewed: 12/07/2014 Elsevier Interactive Patient Education  2017 [employer].

## 2021-08-18 ENCOUNTER — Other Ambulatory Visit: Payer: Self-pay

## 2021-08-19 ENCOUNTER — Ambulatory Visit (INDEPENDENT_AMBULATORY_CARE_PROVIDER_SITE_OTHER): Payer: Medicare Other | Admitting: Family Medicine

## 2021-08-19 VITALS — BP 134/70 | HR 79 | Temp 97.8°F | Ht 64.0 in | Wt 174.6 lb

## 2021-08-19 DIAGNOSIS — Z23 Encounter for immunization: Secondary | ICD-10-CM | POA: Diagnosis not present

## 2021-08-19 DIAGNOSIS — M818 Other osteoporosis without current pathological fracture: Secondary | ICD-10-CM

## 2021-08-19 MED ORDER — DENOSUMAB 60 MG/ML ~~LOC~~ SOSY
60.0000 mg | PREFILLED_SYRINGE | Freq: Once | SUBCUTANEOUS | Status: AC
  Administered 2021-08-19: 60 mg via SUBCUTANEOUS

## 2021-08-19 NOTE — Progress Notes (Signed)
Established Patient Office Visit  Subjective:  Patient ID: Shelly [name], female    DOB: 12-26-1945  Age: 75 y.o. MRN: [id number]  CC:  Chief Complaint  Patient presents with   Annual Exam    HPI Shelly [name] presents for discussion regarding duration of Prolia therapy.  She has been on this for about 5 years.  Her last DEXA scan was 2019.  She has taken vitamin D and calcium only sporadically because of issues with IBS irritation with taking those.  She does not get regular weightbearing exercise.  She apparently had suggestion from another provider that she should stop the Prolia after 5 years.  We explained the difference between denosumab therapy and bisphosphonates and specifically concerns for more rapid bone loss especially vertebral loss after discontinuation of Prolia.  She denies any recent falls.  Past Medical History:  Diagnosis Date   Allergy    Arthritis    wrist, shoulder    Cancer ([place]) 08/2019   left breast IDC   Diverticulitis    Endometrial polyp    Family history of breast cancer    GERD (gastroesophageal reflux disease)    occasional - diet controlled   Hx of adenomatous colonic polyps    Hyperlipidemia    IBS (irritable bowel syndrome)    Missed abortion    x 1 - resolved - no surgery   Osteoporosis    SVD (spontaneous vaginal delivery)    x 1   Transient vision disturbance 2019   was evaluated by ED, no findings   Vitamin D deficiency     Past Surgical History:  Procedure Laterality Date   BREAST EXCISIONAL BIOPSY Right 08/2019   Benign, lesion    BREAST LUMPECTOMY Left 09/2019   BREAST LUMPECTOMY WITH RADIOACTIVE SEED AND SENTINEL LYMPH NODE BIOPSY Left 10/06/2019   Procedure: LEFT BREAST LUMPECTOMY  X2 WITH RADIOACTIVE SEED X2  AND SENTINEL LYMPH NODE BIOPSY;  Surgeon: Jovita Kussmaul, MD;  Location: [place];  Service: General;  Laterality: Left;   BREAST LUMPECTOMY WITH RADIOACTIVE SEED LOCALIZATION Right  10/06/2019   Procedure: RIGHT BREAST LUMPECTOMY WITH RADIOACTIVE SEED LOCALIZATION;  Surgeon: Jovita Kussmaul, MD;  Location: [place];  Service: General;  Laterality: Right;   BRONCHIAL BIOPSY  12/27/2020   Procedure: BRONCHIAL BIOPSIES;  Surgeon: Garner Nash, DO;  Location: [facility];  Service: Pulmonary;;   BRONCHIAL BRUSHINGS  12/27/2020   Procedure: BRONCHIAL BRUSHINGS;  Surgeon: Garner Nash, DO;  Location: [facility];  Service: Pulmonary;;   BRONCHIAL NEEDLE ASPIRATION BIOPSY  12/27/2020   Procedure: BRONCHIAL NEEDLE ASPIRATION BIOPSIES;  Surgeon: Garner Nash, DO;  Location: [facility];  Service: Pulmonary;;   BRONCHIAL WASHINGS  12/27/2020   Procedure: BRONCHIAL WASHINGS;  Surgeon: Garner Nash, DO;  Location: [facility];  Service: Pulmonary;;   COLONOSCOPY     DIAGNOSTIC LAPAROSCOPY     cysts   DILATATION & CURETTAGE/HYSTEROSCOPY WITH TRUECLEAR N/A 10/31/2013   Procedure: DILATATION & CURETTAGE/HYSTEROSCOPY WITH TRUCLEAR;  Surgeon: Marylynn Pearson, MD;  Location: [facility];  Service: Gynecology;  Laterality: N/A;   DILATION AND CURETTAGE OF UTERUS     MASS EXCISION Left 11/06/2019   Procedure: EXCISION NIPPLE AND AREOLA  MARGIN LEFT BREAST;  Surgeon: Jovita Kussmaul, MD;  Location: [place];  Service: General;  Laterality: Left;   TONSILLECTOMY  1952   VIDEO BRONCHOSCOPY WITH ENDOBRONCHIAL NAVIGATION Bilateral 12/27/2020   Procedure: VIDEO BRONCHOSCOPY WITH  ENDOBRONCHIAL NAVIGATION;  Surgeon: Garner Nash, DO;  Location: [facility];  Service: Pulmonary;  Laterality: Bilateral;   VIDEO BRONCHOSCOPY WITH ENDOBRONCHIAL ULTRASOUND Bilateral 12/27/2020   Procedure: VIDEO BRONCHOSCOPY WITH ENDOBRONCHIAL ULTRASOUND;  Surgeon: Garner Nash, DO;  Location: [place];  Service: Pulmonary;  Laterality: Bilateral;   WISDOM TOOTH EXTRACTION      Family History  Problem Relation Age of Onset   Hyperlipidemia Mother    Hypertension  Mother    Diabetes Mother        type ll   Leukemia Mother 26   Colon polyps Mother    Lung cancer Father    Hyperlipidemia Father    Breast cancer Cousin 65       maternal first couin   Heart disease Other    Hypertension Other    Cancer Other    Depression Brother    Breast cancer Maternal Aunt 50   Uterine cancer Paternal Grandmother    Breast cancer Maternal Aunt 53   Lung cancer Maternal Grandfather    Cancer Maternal Grandfather        gastric cancer?   Cancer Maternal Uncle        unknown   Brain cancer Paternal Uncle    Throat cancer Paternal Uncle    Colon cancer Neg Hx    Esophageal cancer Neg Hx    Stomach cancer Neg Hx    Rectal cancer Neg Hx     Social History   Socioeconomic History   Marital status: Married    Spouse name: Not on file   Number of children: 1   Years of education: Not on file   Highest education level: Not on file  Occupational History   Not on file  Tobacco Use   Smoking status: Former    Packs/day: 0.10    Years: 20.00    Pack years: 2.00    Types: Cigarettes    Quit date: 05/15/2007    Years since quitting: 14.2   Smokeless tobacco: Never  Vaping Use   Vaping Use: Never used  Substance and Sexual Activity   Alcohol use: Yes    Alcohol/week: 14.0 standard drinks    Types: 14 Glasses of wine per week    Comment: 2 glasses wine every evening   Drug use: No   Sexual activity: Yes    Birth control/protection: Post-menopausal  Other Topics Concern   Not on file  Social History Narrative   Lives with husband in 2 story house   Has one daughter, with 3 grandchildren, local, supportive   Enjoys gardening/walking outside with husband   Occasionally does home exercise calisthenics but not recently.   Well-balanced diet overall   Social Determinants of Health   Financial Resource Strain: Low Risk    Difficulty of Paying Living Expenses: Not hard at all  Food Insecurity: No Food Insecurity   Worried About Charity fundraiser  in the Last Year: Never true   Ran Out of Food in the Last Year: Never true  Transportation Needs: No Transportation Needs   Lack of Transportation (Medical): No   Lack of Transportation (Non-Medical): No  Physical Activity: Insufficiently Active   Days of Exercise per Week: 3 days   Minutes of Exercise per Session: 30 min  Stress: No Stress Concern Present   Feeling of Stress : Not at all  Social Connections: Moderately Isolated   Frequency of Communication with Friends and Family: Three times a week   Frequency of  Social Gatherings with Friends and Family: Three times a week   Attends Religious Services: Never   Active Member of Clubs or Organizations: Not on file   Attends Archivist Meetings: Never   Marital Status: Married  Human resources officer Violence: Not At Risk   Fear of Current or Ex-Partner: No   Emotionally Abused: No   Physically Abused: No   Sexually Abused: No    Outpatient Medications Prior to Visit  Medication Sig Dispense Refill   acetaminophen (TYLENOL) 500 MG tablet Take 500 mg by mouth every 6 (six) hours as needed for headache or mild pain.     clidinium-chlordiazePOXIDE (LIBRAX) 5-2.5 MG capsule TAKE 1 CAPSULE BY MOUTH DAILY AS NEEDED (IBS). 30 capsule 0   esomeprazole (NEXIUM) 20 MG capsule Take 20 mg by mouth daily as needed (acid reflux).     loratadine (CLARITIN) 10 MG tablet Take 10 mg by mouth daily.     Polyethyl Glycol-Propyl Glycol (LUBRICANT EYE DROPS) 0.4-0.3 % SOLN Place 1 drop into both eyes 3 (three) times daily as needed (dry/irritated eyes).     Vitamin D, Ergocalciferol, (DRISDOL) 1.25 MG (50000 UNIT) CAPS capsule TAKE 1 CAPSULE (50,000 UNITS TOTAL) BY MOUTH EVERY 7 (SEVEN) DAYS. 12 capsule 1   No facility-administered medications prior to visit.    Allergies  Allergen Reactions   Taxotere [Docetaxel] Swelling   Abraxane [Paclitaxel Protein-Bound Part] Other (See Comments)    3 days after receiving she had one episode of very high  BP and HR. This happened again 2 days later during the night.    Crestor [Rosuvastatin Calcium] Itching    GI upset   Lipitor [Atorvastatin Calcium] Itching   Nsaids Other (See Comments)    GI upset   Pravastatin Itching    GI upset   Sulfonamide Derivatives Rash    Hives, itiching    ROS Review of Systems  Constitutional:  Negative for appetite change and unexpected weight change.  Respiratory:  Negative for shortness of breath.   Cardiovascular:  Negative for chest pain.     Objective:    Physical Exam Vitals reviewed.  Constitutional:      Appearance: Normal appearance.  Cardiovascular:     Rate and Rhythm: Normal rate and regular rhythm.  Pulmonary:     Effort: Pulmonary effort is normal.     Breath sounds: Normal breath sounds.  Neurological:     Mental Status: She is alert.    BP 134/70 (BP Location: Left Arm, Patient Position: Sitting, Cuff Size: Normal)   Pulse 79   Temp 97.8 F (36.6 C) (Oral)   Ht 5\' 4"  (1.626 m)   Wt 174 lb 9.6 oz (79.2 kg)   SpO2 98%   BMI 29.97 kg/m  Wt Readings from Last 3 Encounters:  08/19/21 174 lb 9.6 oz (79.2 kg)  05/20/21 173 lb 6.4 oz (78.7 kg)  05/16/21 176 lb 8 oz (80.1 kg)     Health Maintenance Due  Topic Date Due   Zoster Vaccines- Shingrix (2 of 2) 10/17/2019   COVID-19 Vaccine (5 - Booster for Pfizer series) 06/23/2021    There are no preventive care reminders to display for this patient.  Lab Results  Component Value Date   TSH 1.06 06/04/2011   Lab Results  Component Value Date   WBC 6.1 05/16/2021   HGB 12.3 05/16/2021   HCT 38.5 05/16/2021   MCV 83.7 05/16/2021   PLT 298 05/16/2021   Lab Results  Component Value  Date   NA 138 05/16/2021   K 4.2 05/16/2021   CO2 27 05/16/2021   GLUCOSE 100 (H) 05/16/2021   BUN 19 05/16/2021   CREATININE 0.86 05/16/2021   BILITOT 0.3 05/16/2021   ALKPHOS 69 05/16/2021   AST 12 (L) 05/16/2021   ALT 11 05/16/2021   PROT 6.7 05/16/2021   ALBUMIN 3.4 (L)  05/16/2021   CALCIUM 8.7 (L) 05/16/2021   ANIONGAP 6 05/16/2021   GFR 66.91 01/07/2018   Lab Results  Component Value Date   CHOL 166 09/16/2011   Lab Results  Component Value Date   HDL 58.90 09/16/2011   Lab Results  Component Value Date   LDLCALC 87 09/16/2011   Lab Results  Component Value Date   TRIG 103.0 09/16/2011   Lab Results  Component Value Date   CHOLHDL 3 09/16/2011   Lab Results  Component Value Date   HGBA1C 6.6 (H) 08/14/2020      Assessment & Plan:   Problem List Items Addressed This Visit       Unprioritized   Osteoporosis - Primary   Relevant Orders   DG Bone Density   Other Visit Diagnoses     Need for immunization against influenza       Relevant Orders   Flu Vaccine QUAD High Dose(Fluad) (Completed)      -Set up repeat DEXA scan -Have encouraged her to try to continue with adequate calcium and vitamin D intake and try to get her calcium as much as possible through dietary means. -We had a long discussion regarding pros and cons of prolonged therapy on Prolia.  We discussed differences between denosumab and bisphosphonates regarding duration of therapy and specifically concerns for rapid bone loss after discontinuation of Prolia.  If she does choose to go off Prolia would certainly suggest follow-up DEXA in 1 year to reassess  No orders of the defined types were placed in this encounter.   Follow-up: No follow-ups on file.    Carolann Littler, MD

## 2021-08-19 NOTE — Patient Instructions (Signed)
Schedule DEXA with schedulers up front.

## 2021-08-21 ENCOUNTER — Ambulatory Visit
Admission: RE | Admit: 2021-08-21 | Discharge: 2021-08-21 | Disposition: A | Discharge status: Home / Self Care | Payer: Medicare Other | Source: Ambulatory Visit | Attending: Hematology | Admitting: Hematology

## 2021-08-21 ENCOUNTER — Other Ambulatory Visit: Payer: Self-pay

## 2021-08-21 DIAGNOSIS — R922 Inconclusive mammogram: Secondary | ICD-10-CM | POA: Diagnosis not present

## 2021-08-21 DIAGNOSIS — C50112 Malignant neoplasm of central portion of left female breast: Secondary | ICD-10-CM

## 2021-08-21 DIAGNOSIS — Z170 Estrogen receptor positive status [ER+]: Secondary | ICD-10-CM

## 2021-08-22 ENCOUNTER — Ambulatory Visit (INDEPENDENT_AMBULATORY_CARE_PROVIDER_SITE_OTHER)
Admission: RE | Admit: 2021-08-22 | Discharge: 2021-08-22 | Disposition: A | Discharge status: Home / Self Care | Payer: Medicare Other | Source: Ambulatory Visit | Attending: Family Medicine | Admitting: Family Medicine

## 2021-08-22 DIAGNOSIS — M818 Other osteoporosis without current pathological fracture: Secondary | ICD-10-CM | POA: Diagnosis not present

## 2021-09-16 ENCOUNTER — Other Ambulatory Visit: Payer: Self-pay | Admitting: Family Medicine

## 2021-09-16 NOTE — Telephone Encounter (Signed)
Dr. Burchette pt  

## 2021-09-16 NOTE — Telephone Encounter (Signed)
Please advise. Rx is no on the current med list

## 2021-09-23 DIAGNOSIS — C50112 Malignant neoplasm of central portion of left female breast: Secondary | ICD-10-CM | POA: Diagnosis not present

## 2021-09-23 DIAGNOSIS — Z170 Estrogen receptor positive status [ER+]: Secondary | ICD-10-CM | POA: Diagnosis not present

## 2021-11-07 ENCOUNTER — Other Ambulatory Visit: Payer: Self-pay | Admitting: Family Medicine

## 2021-11-14 ENCOUNTER — Inpatient Hospital Stay: Payer: Medicare Other

## 2021-11-14 ENCOUNTER — Inpatient Hospital Stay: Payer: Medicare Other | Admitting: Hematology

## 2021-11-16 ENCOUNTER — Other Ambulatory Visit: Payer: Self-pay | Admitting: Family Medicine

## 2021-11-18 ENCOUNTER — Inpatient Hospital Stay (HOSPITAL_BASED_OUTPATIENT_CLINIC_OR_DEPARTMENT_OTHER): Payer: Medicare Other | Admitting: Physician Assistant

## 2021-11-18 ENCOUNTER — Inpatient Hospital Stay: Payer: Medicare Other | Attending: Physician Assistant

## 2021-11-18 ENCOUNTER — Inpatient Hospital Stay: Payer: Medicare Other | Admitting: Hematology

## 2021-11-18 ENCOUNTER — Other Ambulatory Visit: Payer: Self-pay

## 2021-11-18 VITALS — BP 153/71 | HR 70 | Temp 97.9°F | Resp 18 | Ht 64.0 in | Wt 180.7 lb

## 2021-11-18 DIAGNOSIS — C50112 Malignant neoplasm of central portion of left female breast: Secondary | ICD-10-CM | POA: Diagnosis not present

## 2021-11-18 DIAGNOSIS — Z853 Personal history of malignant neoplasm of breast: Secondary | ICD-10-CM | POA: Diagnosis not present

## 2021-11-18 DIAGNOSIS — K219 Gastro-esophageal reflux disease without esophagitis: Secondary | ICD-10-CM | POA: Diagnosis not present

## 2021-11-18 DIAGNOSIS — Z170 Estrogen receptor positive status [ER+]: Secondary | ICD-10-CM | POA: Diagnosis not present

## 2021-11-18 DIAGNOSIS — K580 Irritable bowel syndrome with diarrhea: Secondary | ICD-10-CM | POA: Diagnosis not present

## 2021-11-18 DIAGNOSIS — J8489 Other specified interstitial pulmonary diseases: Secondary | ICD-10-CM | POA: Diagnosis not present

## 2021-11-18 LAB — CMP (CANCER CENTER ONLY)
ALT: 13 U/L (ref 0–44)
AST: 12 U/L — ABNORMAL LOW (ref 15–41)
Albumin: 4.1 g/dL (ref 3.5–5.0)
Alkaline Phosphatase: 68 U/L (ref 38–126)
Anion gap: 6 (ref 5–15)
BUN: 21 mg/dL (ref 8–23)
CO2: 30 mmol/L (ref 22–32)
Calcium: 9.9 mg/dL (ref 8.9–10.3)
Chloride: 104 mmol/L (ref 98–111)
Creatinine: 0.93 mg/dL (ref 0.44–1.00)
GFR, Estimated: >60 mL/min (ref >60)
Glucose, Bld: 101 mg/dL — ABNORMAL HIGH (ref 70–99)
Potassium: 4.3 mmol/L (ref 3.5–5.1)
Sodium: 140 mmol/L (ref 135–145)
Total Bilirubin: 0.4 mg/dL (ref 0.3–1.2)
Total Protein: 6.9 g/dL (ref 6.5–8.1)

## 2021-11-18 LAB — CBC WITH DIFFERENTIAL (CANCER CENTER ONLY)
Abs Immature Granulocytes: 0.01 10*3/uL (ref 0.00–0.07)
Basophils Absolute: 0 10*3/uL (ref 0.0–0.1)
Basophils Relative: 1 %
Eosinophils Absolute: 0.1 10*3/uL (ref 0.0–0.5)
Eosinophils Relative: 1 %
HCT: 40.1 % (ref 36.0–46.0)
Hemoglobin: 13.4 g/dL (ref 12.0–15.0)
Immature Granulocytes: 0 %
Lymphocytes Relative: 27 %
Lymphs Abs: 1.6 10*3/uL (ref 0.7–4.0)
MCH: 29 pg (ref 26.0–34.0)
MCHC: 33.4 g/dL (ref 30.0–36.0)
MCV: 86.8 fL (ref 80.0–100.0)
Monocytes Absolute: 0.5 10*3/uL (ref 0.1–1.0)
Monocytes Relative: 9 %
Neutro Abs: 3.8 10*3/uL (ref 1.7–7.7)
Neutrophils Relative %: 62 %
Platelet Count: 263 10*3/uL (ref 150–400)
RBC: 4.62 MIL/uL (ref 3.87–5.11)
RDW: 14 % (ref 11.5–15.5)
WBC Count: 6 10*3/uL (ref 4.0–10.5)
nRBC: 0 % (ref 0.0–0.2)

## 2021-11-18 NOTE — Progress Notes (Signed)
[name]   [phone] [phone]   Clinic Follow up Note   Patient Care Team: Eulas Post, MD as PCP - General (Family Medicine) Barbaraann Cao, [place] as Referring Physician (Optometry) Jovita Kussmaul, MD as Consulting Physician (General Surgery) Mauro Kaufmann, RN as Oncology Nurse Navigator Rockwell Germany, RN as Oncology Nurse Navigator Truitt Merle, MD as Consulting Physician (Hematology) Alla Feeling, NP as Nurse Practitioner (Nurse Practitioner) Eppie Gibson, MD as Attending Physician (Radiation Oncology)  Date of Service:  11/18/2021  CHIEF COMPLAINT: f/u of left breast cancer  SUMMARY OF ONCOLOGIC HISTORY: Oncology History Overview Note  Cancer Staging Cancer of central portion of left female breast [facility]) Staging form: Breast, AJCC 8th Edition - Clinical stage from 09/18/2019: Stage IA (cT1b, cN0, cM0, G2, ER+, PR+, HER2-) - Signed by Truitt Merle, MD on 09/18/2019 - Pathologic stage from 10/06/2019: Stage IA (pT1c, pN1a, cM0, G2, ER+, PR+, HER2-) - Signed by Truitt Merle, MD on 10/19/2019    Cancer of central portion of left female breast ([place])  08/24/2019 Mammogram   Diagnostic mammogram 08/24/19  IMPRESSION: 1. Left breast mass measuring 0.8 x 0.4 x 0.5 cm at the 1 o'clock retroareolar region is suspicious and likely corresponds to the area of distortion seen on mammography.   2. Left breast 3 o'clock retroareolar dilated duct with internal debris versus solid mass.   3. Right breast retroareolar focal asymmetry without sonographic correlate.   08/28/2019 Initial Biopsy   Final Diagnosis 08/28/19 1. Breast, left, needle core biopsy, 1 o'clock/retroareolar - INVASIVE DUCTAL CARCINOMA, GRADE II. - PERINEURAL INVASION. - SEE MICROSCOPIC DESCRIPTION. 2. Breast, left, needle core biopsy, 3 o'clock retroareolar - DUCTAL PAPILLOMA. - SEE MICROSCOPIC DESCRIPTION.   08/28/2019 Receptors her2   1. PROGNOSTIC INDICATORS  08/28/19 Results: IMMUNOHISTOCHEMICAL AND MORPHOMETRIC ANALYSIS PERFORMED MANUALLY The tumor cells are NEGATIVE for Her2 (0). Estrogen Receptor: 100%, POSITIVE, STRONG STAINING INTENSITY Progesterone Receptor: 50%, POSITIVE, STRONG STAINING INTENSITY Proliferation Marker Ki67: 12%   09/01/2019 Pathology Results   Diagnosis 09/01/19 Breast, right, needle core biopsy, retroareolar - COMPLEX SCLEROSING LESION WITH A PAPILLARY SCLEROSING LESION AND USUAL DUCTAL HYPERPLASIA. SEE NOTE - NEGATIVE FOR CARCINOMA   09/18/2019 Initial Diagnosis   Cancer of central portion of left female breast ([place])   09/18/2019 Cancer Staging   Staging form: Breast, AJCC 8th Edition - Clinical stage from 09/18/2019: Stage IA (cT1b, cN0, cM0, G2, ER+, PR+, HER2-) - Signed by Truitt Merle, MD on 09/18/2019    10/06/2019 Surgery   LEFT BREAST LUMPECTOMY  X2 WITH RADIOACTIVE SEED X2  AND SENTINEL LYMPH NODE BIOPSY and  RIGHT BREAST LUMPECTOMY WITH RADIOACTIVE SEED LOCALIZATION by Dr Marlou Starks  10/06/19    10/06/2019 Pathology Results   FINAL MICROSCOPIC DIAGNOSIS: 10/06/19   A. BREAST, RIGHT, LUMPECTOMY:  - Fibrocystic change with a small incidental intraductal papilloma and  usual ductal hyperplasia  - Biopsy site changes  - Negative for carcinoma   B. LYMPH NODE, LEFT #1, SENTINEL, BIOPSY:  - Metastatic carcinoma to a lymph node (1/1)  - Focus of metastatic carcinoma measures 2.5 mm and shows extranodal  extension   C. LYMPH NODE, LEFT #2, SENTINEL, BIOPSY:  - Lymph node, negative for carcinoma (0/1)   D. LYMPH NODE, LEFT #3, SENTINEL, BIOPSY:  - Lymph node, negative for carcinoma (0/1)   E. BREAST, LEFT, LUMPECTOMY:  - Invasive ductal carcinoma, grade 2, 1.8 cm  - Ductal carcinoma in situ, intermediate grade  - Invasive  carcinoma focally involves the anterior margin  - DCIS is less than 1 mm from the superior margin  - Background breast parenchyma with fibrocystic change, usual ductal  hyperplasia and  intraductal papilloma.  - Biopsy site changes  - See oncology table    10/06/2019 Miscellaneous   Her Mammaprint showed High Risk Luminal Type B, risk of recurrence in the next 10 years is 29% in high risk disease. MPI -0.335    10/06/2019 Cancer Staging   Staging form: Breast, AJCC 8th Edition - Pathologic stage from 10/06/2019: Stage IA (pT1c, pN1a, cM0, G2, ER+, PR+, HER2-) - Signed by Truitt Merle, MD on 10/19/2019    11/06/2019 Surgery   EXCISION NIPPLE AND AREOLA  MARGIN LEFT BREAST by Dr Marlou Starks  11/06/19    11/06/2019 Pathology Results    FINAL MICROSCOPIC DIAGNOSIS: 11/06/19  A. BREAST, LEFT CENTRAL, RE-EXCISION:  -  Residual ductal carcinoma in situ, intermediate grade  -  Margins uninvolved by carcinoma (0.4 cm; superior margin)  -  Atypical intraductal papilloma  -  Usual ductal hyperplasia  -  Benign skin with seborrheic keratosis  -  Previous surgical site changes  -  No invasive carcinoma identified    12/06/2019 - 12/20/2019 Chemotherapy   Adjuvant chemo TC q3weeks for 4 cycles starting 12/06/19. With first TC infusion she had allergy reaction to docetaxel and [name] was not given on 12/06/19, we discontinued.   -I started her on weekly Abraxane on 12/20/19, but 2 days after infusion she started to feel tachycardic, hot/flushed, and lightheaded. A few days later she had dysuria, diarrhea and abdominal cramps and intermittent chest pain. She opted to d/c chemo and proceed with RT.    01/25/2020 - 03/06/2020 Radiation Therapy   Adjuvant Radiation with Dr Isidore Moos 01/25/20-03/06/20   05/2020 - 07/2020 Anti-estrogen oral therapy   I started her on antiestrogen therapy with Tamoxifen in 03/2020. She took until 06/06/20 and 11m 06/04/20- 06/2020 but stopped due to exacerbated IBS symptoms. She also tried 2 weeks of Exemestane in 07/2020 but stopped due to increased Anxiety.   06/06/2020 Survivorship   Care plan delivered by [name] NP    12/04/2020 Imaging   CT chest  IMPRESSION: 1.  Marked interval worsening of parenchymal disease now involving areas of the LEFT lower lobe, lingula, RIGHT upper lobe and with nodular opacity in the RIGHT lower lobe. Findings could be seen in the setting of COVID-19 infection. Pattern of disease also raises the question of organizing pneumonia. Pneumonitis, drug related changes are also considered. Pattern of disease would be atypical for neoplasm. Pulmonary consultation is suggested if not yet performed. Continued short interval surveillance is also suggested. 2. Post LEFT breast lumpectomy and axillary dissection. 3. Calcified coronary artery disease in LEFT and RIGHT coronary circulation. 4. Stable LEFT adrenal nodule compatible with benign adenoma. 5. Aortic atherosclerosis.   Aortic Atherosclerosis (ICD10-I70.0).      CURRENT THERAPY:  Surveillance  INTERVAL HISTORY:  PRiona LahtiElder is here for a follow up of breast cancer. She was last seen by Dr. FBurr Medicoon 05/16/2021. She is unaccompanied for this visit.   Ms. EZobristreports that her energy levels are fairly stable. She is trying to become more active in the new year and exercise. She was experience diffuse bone ach pain which improved after receiving her most recent Prolia injection on 08/19/2021.  Additionally she is taking calcium supplements to improve her bone health.  Patient denies any appetite changes or weight loss.  She  denies nausea, vomiting or abdominal pain.  She reports having intermittent episodes of diarrhea since starting her calcium supplement.  Patient denies any fevers, chills, night sweats, shortness of breath, chest pain, breast discomfort.  He has no other complaints.  All other systems were reviewed with the patient and are negative.  MEDICAL HISTORY:  Past Medical History:  Diagnosis Date   Allergy    Arthritis    wrist, shoulder    Cancer ([place]) 08/2019   left breast IDC   Diverticulitis    Endometrial polyp    Family history of breast cancer     GERD (gastroesophageal reflux disease)    occasional - diet controlled   Hx of adenomatous colonic polyps    Hyperlipidemia    IBS (irritable bowel syndrome)    Missed abortion    x 1 - resolved - no surgery   Osteoporosis    SVD (spontaneous vaginal delivery)    x 1   Transient vision disturbance 2019   was evaluated by ED, no findings   Vitamin D deficiency     SURGICAL HISTORY: Past Surgical History:  Procedure Laterality Date   BREAST EXCISIONAL BIOPSY Right 08/2019   Benign, lesion    BREAST LUMPECTOMY Left 09/2019   BREAST LUMPECTOMY WITH RADIOACTIVE SEED AND SENTINEL LYMPH NODE BIOPSY Left 10/06/2019   Procedure: LEFT BREAST LUMPECTOMY  X2 WITH RADIOACTIVE SEED X2  AND SENTINEL LYMPH NODE BIOPSY;  Surgeon: Jovita Kussmaul, MD;  Location: [facility];  Service: General;  Laterality: Left;   BREAST LUMPECTOMY WITH RADIOACTIVE SEED LOCALIZATION Right 10/06/2019   Procedure: RIGHT BREAST LUMPECTOMY WITH RADIOACTIVE SEED LOCALIZATION;  Surgeon: Jovita Kussmaul, MD;  Location: [place];  Service: General;  Laterality: Right;   BRONCHIAL BIOPSY  12/27/2020   Procedure: BRONCHIAL BIOPSIES;  Surgeon: Garner Nash, DO;  Location: [facility] [facility];  Service: Pulmonary;;   BRONCHIAL BRUSHINGS  12/27/2020   Procedure: BRONCHIAL BRUSHINGS;  Surgeon: Garner Nash, DO;  Location: [facility];  Service: Pulmonary;;   BRONCHIAL NEEDLE ASPIRATION BIOPSY  12/27/2020   Procedure: BRONCHIAL NEEDLE ASPIRATION BIOPSIES;  Surgeon: Garner Nash, DO;  Location: [facility];  Service: Pulmonary;;   BRONCHIAL WASHINGS  12/27/2020   Procedure: BRONCHIAL WASHINGS;  Surgeon: Garner Nash, DO;  Location: [facility];  Service: Pulmonary;;   COLONOSCOPY     DIAGNOSTIC LAPAROSCOPY     cysts   DILATATION & CURETTAGE/HYSTEROSCOPY WITH TRUECLEAR N/A 10/31/2013   Procedure: DILATATION & CURETTAGE/HYSTEROSCOPY WITH TRUCLEAR;  Surgeon: Marylynn Pearson, MD;  Location: [facility]  [facility];  Service: Gynecology;  Laterality: N/A;   DILATION AND CURETTAGE OF UTERUS     MASS EXCISION Left 11/06/2019   Procedure: EXCISION NIPPLE AND AREOLA  MARGIN LEFT BREAST;  Surgeon: Jovita Kussmaul, MD;  Location: [place];  Service: General;  Laterality: Left;   TONSILLECTOMY  1954   VIDEO BRONCHOSCOPY WITH ENDOBRONCHIAL NAVIGATION Bilateral 12/27/2020   Procedure: VIDEO BRONCHOSCOPY WITH ENDOBRONCHIAL NAVIGATION;  Surgeon: Garner Nash, DO;  Location: [place];  Service: Pulmonary;  Laterality: Bilateral;   VIDEO BRONCHOSCOPY WITH ENDOBRONCHIAL ULTRASOUND Bilateral 12/27/2020   Procedure: VIDEO BRONCHOSCOPY WITH ENDOBRONCHIAL ULTRASOUND;  Surgeon: Garner Nash, DO;  Location: [place];  Service: Pulmonary;  Laterality: Bilateral;   WISDOM TOOTH EXTRACTION      I have reviewed the social history and family history with the patient and they are unchanged from previous note.  ALLERGIES:  is allergic to taxotere [  docetaxel], abraxane [paclitaxel protein-bound part], crestor [rosuvastatin calcium], lipitor [atorvastatin calcium], nsaids, pravastatin, and sulfonamide derivatives.  MEDICATIONS:  Current Outpatient Medications  Medication Sig Dispense Refill   acetaminophen (TYLENOL) 500 MG tablet Take 500 mg by mouth every 6 (six) hours as needed for headache or mild pain.     clidinium-chlordiazePOXIDE (LIBRAX) 5-2.5 MG capsule TAKE 1 CAPSULE BY MOUTH DAILY AS NEEDED (IBS). 60 capsule 1   esomeprazole (NEXIUM) 20 MG capsule Take 20 mg by mouth daily as needed (acid reflux).     loratadine (CLARITIN) 10 MG tablet Take 10 mg by mouth daily.     Polyethyl Glycol-Propyl Glycol (LUBRICANT EYE DROPS) 0.4-0.3 % SOLN Place 1 drop into both eyes 3 (three) times daily as needed (dry/irritated eyes).     Vitamin D, Ergocalciferol, (DRISDOL) 1.25 MG (50000 UNIT) CAPS capsule TAKE 1 CAPSULE (50,000 UNITS TOTAL) BY MOUTH EVERY 7 (SEVEN) DAYS. 12 capsule 1   No current  facility-administered medications for this visit.    PHYSICAL EXAMINATION: ECOG PERFORMANCE STATUS: 0 - Asymptomatic  Vitals:   11/18/21 1036  BP: (!) 153/71  Pulse: 70  Resp: 18  Temp: 97.9 F (36.6 C)  SpO2: 97%   Filed Weights   11/18/21 1036  Weight: 180 lb 11.2 oz (82 kg)    GENERAL:alert, no distress and comfortable SKIN: skin color, texture, turgor are normal, no rashes or significant lesions; moles present on chest EYES: normal, Conjunctiva are pink and non-injected, sclera clear  NECK: supple, non-tender, without nodularity LYMPH:  no palpable lymphadenopathy in the cervical, axillary  LUNGS: clear to auscultation and percussion with normal breathing effort HEART: regular rate & rhythm and no murmurs and no lower extremity edema ABDOMEN:abdomen soft, non-tender and normal bowel sounds Musculoskeletal:no cyanosis of digits and no clubbing  NEURO: alert & oriented x 3 with fluent speech, no focal motor/sensory deficits BREAST: No palpable mass, nodules or adenopathy bilaterally. Breast exam benign.  LABORATORY DATA:  I have reviewed the data as listed CBC Latest Ref Rng & Units 11/18/2021 05/16/2021 03/17/2021  WBC 4.0 - 10.5 K/uL 6.0 6.1 6.4  Hemoglobin 12.0 - 15.0 g/dL 13.4 12.3 12.2  Hematocrit 36.0 - 46.0 % 40.1 38.5 39.0  Platelets 150 - 400 K/uL 263 298 322     CMP Latest Ref Rng & Units 11/18/2021 05/16/2021 03/17/2021  Glucose 70 - 99 mg/dL 101(H) 100(H) 111(H)  BUN 8 - 23 mg/dL 21 19 17   Creatinine 0.44 - 1.00 mg/dL 0.93 0.86 0.82  Sodium 135 - 145 mmol/L 140 138 141  Potassium 3.5 - 5.1 mmol/L 4.3 4.2 4.9  Chloride 98 - 111 mmol/L 104 105 105  CO2 22 - 32 mmol/L 30 27 27   Calcium 8.9 - 10.3 mg/dL 9.9 8.7(L) 9.0  Total Protein 6.5 - 8.1 g/dL 6.9 6.7 6.8  Total Bilirubin 0.3 - 1.2 mg/dL 0.4 0.3 0.3  Alkaline Phos 38 - 126 U/L 68 69 88  AST 15 - 41 U/L 12(L) 12(L) 11(L)  ALT 0 - 44 U/L 13 11 6       RADIOGRAPHIC STUDIES: I have personally reviewed the  radiological images as listed and agreed with the findings in the report. No results found.   ASSESSMENT & PLAN:  Shelly [name] is a 76 y.o. female with   1. Multifocal infiltrates in bilateral lungs, ?  Organizing pneumonia -Her 09/03/20 CT chest showed 2.2 x 1.8 cm area of mixed density in the left upper lobe with surrounding area of groundglass changes.  This could be related to her previous left breast radiation or inflammatory changes, although malignancy is not ruled out but less likely. She is asymptomatic, no clinical concern for infection. -Her CT chest from 12/04/20 shows Marked interval worsening of parenchymal disease now involving areas of the LEFT lower lobe, lingula, RIGHT upper lobe and with nodular opacity in the RIGHT lower lobe. Findings could be seen in the setting of COVID-19 infection or PNA, or interstitial disease  -She notes she was exposed to Rich by son-in-law in 10/2020. She tested negative around that time but felt bad for about 2 weeks.  -She was seen by Dr Valeta Harms and underwent bronchoscopy on 12/27/20. Cytology (from brush, lavage and FNA of LLL and station 7 node) Results was negative for malignancy except LLL FNA showed atypical cells.  -Chest CT 04/16/21 showed: waxing and waning appearance of multifocal airspace disease, with complete resolution of previous abnormalities and new consolidation within right upper and lower lobes, overall suggesting underlying organizing pneumonia with indeterminate etiology. -She will have repeat CT and f/u with Dr. Valeta Harms around June/July 2023.    2. Cancer of central portion of left female breast, Stage IA, c(T1bN0M0), ER/PR+, HER2-, Grade II, pT1cN1aM0, stage IA  -She was diagnosed in 08/2019. She underwent B/l breast lumpectomy and SLNB with Dr. Marlou Starks on 10/06/19. Due to positive margins and 1/3 positive LNs she underwent re-excisional surgery on 11/06/19 which showed DCIS only, margins negative. She has high risk for recurrence  based on Mammaprint.  -She tried adjuvant chemo with TC and Abraxane but could not tolerate either. She did complete adjuvant RT  with Dr Isidore Moos 01/25/20-03/06/20.  -She did not want to pursue Genetic testing -She took antiestrogen therapy (Tamoxifen Exemestane) over 2 years 05/2020-07/2020 and stopped due to poor toleration. -From a breast cancer standpoint, she is clinically doing well. Physical exam benign. -Most recent mammogram from 08/21/2021 show stable surgical findings with no evidence of recurrence.  -Labs from today were reviewed and no intervention is required.  -Continue surveillance. F/u in 4 months    3. Osteoporosis, Vitamin D deficiency -Diagnosed prior to 2007 in [place]. Last DEXA in 06/2015 (-3.2 at [facility]).  -On Prolia injections every 6 months since 2017-2018. Was restarted on 01/30/20 in our clinic and completed 5 years of treatment 01/2021. --Patient resumed Prolia injections with her PCP in October 2022.  --Most recent DEXA scan from 08/22/2021 shows improved bone density.  --Continue OTC Vit D and Tums (for calcium)   4. IBS, Diarrhea and Acid Reflux -history of IBS, noticed intermittent episodes of diarrhea after taking calcium supplements.  -Her BM are controlled on Librax and Nexium and imodium. -Most recent colonoscopy was on 05/20/2021. Findings including multiple colonic polyps that were removed, non-bleeding internal hemorrhoids and diverticulosis. Pathology reveals no evidence of high grade dysphagia or carcinoma.  -Continue to monitor.     PLAN:  -Lab and F/u in 4 months  -CT chest and f/u with Dr. Valeta Harms around June/July 2023.  -Mammogram in October 2023   Patient expressed understanding and satisfaction with the plan provided.   I have spent a total of 25 minutes minutes of face-to-face and non-face-to-face time, preparing to see the patient, performing a medically appropriate examination, counseling and educating the patient, documenting clinical  information in the electronic health record, and care coordination.   Dede Query PA-C Dept of Hematology and [place] at [facility] Phone: [phone]

## 2021-11-18 NOTE — Telephone Encounter (Signed)
Last filled 09/16/2021 Last OV 08/19/2021  Ok to fill?

## 2021-11-19 ENCOUNTER — Telehealth: Payer: Self-pay | Admitting: Physician Assistant

## 2021-11-19 NOTE — Telephone Encounter (Signed)
Scheduled per 1/3 los, pt has been called and confirmed appt

## 2021-11-27 DIAGNOSIS — Z6830 Body mass index (BMI) 30.0-30.9, adult: Secondary | ICD-10-CM | POA: Diagnosis not present

## 2021-11-27 DIAGNOSIS — Z01419 Encounter for gynecological examination (general) (routine) without abnormal findings: Secondary | ICD-10-CM | POA: Diagnosis not present

## 2022-01-12 ENCOUNTER — Ambulatory Visit (INDEPENDENT_AMBULATORY_CARE_PROVIDER_SITE_OTHER): Payer: Medicare Other | Admitting: Family Medicine

## 2022-01-12 VITALS — BP 150/68 | HR 81 | Temp 98.1°F | Ht 63.78 in | Wt 181.5 lb

## 2022-01-12 DIAGNOSIS — M818 Other osteoporosis without current pathological fracture: Secondary | ICD-10-CM | POA: Diagnosis not present

## 2022-01-12 DIAGNOSIS — R030 Elevated blood-pressure reading, without diagnosis of hypertension: Secondary | ICD-10-CM | POA: Diagnosis not present

## 2022-01-12 NOTE — Progress Notes (Signed)
Established Patient Office Visit  Subjective:  Patient ID: Shelly [name], female    DOB: 11-20-1945  Age: 76 y.o. MRN: [id number]  CC: No chief complaint on file.   HPI Shelly [name] presents for medical follow-up.  She has been equivocal regarding whether to stop her Prolia.  She had DEXA scan back in October which showed T score -1.5 in her spine and femur and this compared with -2.1 back in 2019.  She states she has been on Prolia injections for over 5 years.  She takes calcium and vitamin D.  Still not engaging in regular weightbearing exercise.  Last vitamin D level is 2021.  No recent falls.  She had recent CBC and CMP through oncology and these were reviewed  Elevated blood pressure today.  Not treated for hypertension.  Initial blood pressure here today 170/80.  No recent headaches or dizziness.  No chest pains.  No peripheral edema.  Has gained some weight.  No excessive alcohol use.  Past Medical History:  Diagnosis Date   Allergy    Arthritis    wrist, shoulder    Cancer ([facility]) 08/2019   left breast IDC   Diverticulitis    Endometrial polyp    Family history of breast cancer    GERD (gastroesophageal reflux disease)    occasional - diet controlled   Hx of adenomatous colonic polyps    Hyperlipidemia    IBS (irritable bowel syndrome)    Missed abortion    x 1 - resolved - no surgery   Osteoporosis    SVD (spontaneous vaginal delivery)    x 1   Transient vision disturbance 2019   was evaluated by ED, no findings   Vitamin D deficiency     Past Surgical History:  Procedure Laterality Date   BREAST EXCISIONAL BIOPSY Right 08/2019   Benign, lesion    BREAST LUMPECTOMY Left 09/2019   BREAST LUMPECTOMY WITH RADIOACTIVE SEED AND SENTINEL LYMPH NODE BIOPSY Left 10/06/2019   Procedure: LEFT BREAST LUMPECTOMY  X2 WITH RADIOACTIVE SEED X2  AND SENTINEL LYMPH NODE BIOPSY;  Surgeon: Jovita Kussmaul, MD;  Location: [facility];  Service: General;   Laterality: Left;   BREAST LUMPECTOMY WITH RADIOACTIVE SEED LOCALIZATION Right 10/06/2019   Procedure: RIGHT BREAST LUMPECTOMY WITH RADIOACTIVE SEED LOCALIZATION;  Surgeon: Jovita Kussmaul, MD;  Location: [facility];  Service: General;  Laterality: Right;   BRONCHIAL BIOPSY  12/27/2020   Procedure: BRONCHIAL BIOPSIES;  Surgeon: Garner Nash, DO;  Location: [facility];  Service: Pulmonary;;   BRONCHIAL BRUSHINGS  12/27/2020   Procedure: BRONCHIAL BRUSHINGS;  Surgeon: Garner Nash, DO;  Location: [facility];  Service: Pulmonary;;   BRONCHIAL NEEDLE ASPIRATION BIOPSY  12/27/2020   Procedure: BRONCHIAL NEEDLE ASPIRATION BIOPSIES;  Surgeon: Garner Nash, DO;  Location: [facility];  Service: Pulmonary;;   BRONCHIAL WASHINGS  12/27/2020   Procedure: BRONCHIAL WASHINGS;  Surgeon: Garner Nash, DO;  Location: [facility];  Service: Pulmonary;;   COLONOSCOPY     DIAGNOSTIC LAPAROSCOPY     cysts   DILATATION & CURETTAGE/HYSTEROSCOPY WITH TRUECLEAR N/A 10/31/2013   Procedure: DILATATION & CURETTAGE/HYSTEROSCOPY WITH TRUCLEAR;  Surgeon: Marylynn Pearson, MD;  Location: [facility];  Service: Gynecology;  Laterality: N/A;   DILATION AND CURETTAGE OF UTERUS     MASS EXCISION Left 11/06/2019   Procedure: EXCISION NIPPLE AND AREOLA  MARGIN LEFT BREAST;  Surgeon: Jovita Kussmaul, MD;  Location: [facility];  Service: General;  Laterality: Left;   TONSILLECTOMY  1952   VIDEO BRONCHOSCOPY WITH ENDOBRONCHIAL NAVIGATION Bilateral 12/27/2020   Procedure: VIDEO BRONCHOSCOPY WITH ENDOBRONCHIAL NAVIGATION;  Surgeon: Garner Nash, DO;  Location: [facility] [place];  Service: Pulmonary;  Laterality: Bilateral;   VIDEO BRONCHOSCOPY WITH ENDOBRONCHIAL ULTRASOUND Bilateral 12/27/2020   Procedure: VIDEO BRONCHOSCOPY WITH ENDOBRONCHIAL ULTRASOUND;  Surgeon: Garner Nash, DO;  Location: [place];  Service: Pulmonary;  Laterality: Bilateral;   WISDOM TOOTH EXTRACTION      Family  History  Problem Relation Age of Onset   Hyperlipidemia Mother    Hypertension Mother    Diabetes Mother        type ll   Leukemia Mother 55   Colon polyps Mother    Lung cancer Father    Hyperlipidemia Father    Breast cancer Cousin 25       maternal first couin   Heart disease Other    Hypertension Other    Cancer Other    Depression Brother    Breast cancer Maternal Aunt 50   Uterine cancer Paternal Grandmother    Breast cancer Maternal Aunt 68   Lung cancer Maternal Grandfather    Cancer Maternal Grandfather        gastric cancer?   Cancer Maternal Uncle        unknown   Brain cancer Paternal Uncle    Throat cancer Paternal Uncle    Colon cancer Neg Hx    Esophageal cancer Neg Hx    Stomach cancer Neg Hx    Rectal cancer Neg Hx     Social History   Socioeconomic History   Marital status: Married    Spouse name: Not on file   Number of children: 1   Years of education: Not on file   Highest education level: Not on file  Occupational History   Not on file  Tobacco Use   Smoking status: Former    Packs/day: 0.10    Years: 20.00    Pack years: 2.00    Types: Cigarettes    Quit date: 05/15/2007    Years since quitting: 14.6   Smokeless tobacco: Never  Vaping Use   Vaping Use: Never used  Substance and Sexual Activity   Alcohol use: Yes    Alcohol/week: 14.0 standard drinks    Types: 14 Glasses of wine per week    Comment: 2 glasses wine every evening   Drug use: No   Sexual activity: Yes    Birth control/protection: Post-menopausal  Other Topics Concern   Not on file  Social History Narrative   Lives with husband in 2 story house   Has one daughter, with 3 grandchildren, local, supportive   Enjoys gardening/walking outside with husband   Occasionally does home exercise calisthenics but not recently.   Well-balanced diet overall   Social Determinants of Health   Financial Resource Strain: Low Risk    Difficulty of Paying Living Expenses: Not hard  at all  Food Insecurity: No Food Insecurity   Worried About Charity fundraiser in the Last Year: Never true   Ran Out of Food in the Last Year: Never true  Transportation Needs: No Transportation Needs   Lack of Transportation (Medical): No   Lack of Transportation (Non-Medical): No  Physical Activity: Insufficiently Active   Days of Exercise per Week: 3 days   Minutes of Exercise per Session: 30 min  Stress: No Stress Concern Present   Feeling of  Stress : Not at all  Social Connections: Moderately Isolated   Frequency of Communication with Friends and Family: Three times a week   Frequency of Social Gatherings with Friends and Family: Three times a week   Attends Religious Services: Never   Active Member of Clubs or Organizations: Not on file   Attends Archivist Meetings: Never   Marital Status: Married  Human resources officer Violence: Not At Risk   Fear of Current or Ex-Partner: No   Emotionally Abused: No   Physically Abused: No   Sexually Abused: No    Outpatient Medications Prior to Visit  Medication Sig Dispense Refill   acetaminophen (TYLENOL) 500 MG tablet Take 500 mg by mouth every 6 (six) hours as needed for headache or mild pain.     clidinium-chlordiazePOXIDE (LIBRAX) 5-2.5 MG capsule TAKE 1 CAPSULE BY MOUTH DAILY AS NEEDED (IBS). 90 capsule 0   denosumab (PROLIA) 60 MG/ML SOSY injection Inject 60 mg into the skin every 6 (six) months. Last inj was in October     esomeprazole (NEXIUM) 20 MG capsule Take 20 mg by mouth daily as needed (acid reflux).     loratadine (CLARITIN) 10 MG tablet Take 10 mg by mouth daily.     Polyethyl Glycol-Propyl Glycol (LUBRICANT EYE DROPS) 0.4-0.3 % SOLN Place 1 drop into both eyes 3 (three) times daily as needed (dry/irritated eyes).     Vitamin D, Ergocalciferol, (DRISDOL) 1.25 MG (50000 UNIT) CAPS capsule TAKE 1 CAPSULE (50,000 UNITS TOTAL) BY MOUTH EVERY 7 (SEVEN) DAYS. 12 capsule 1   calcium-vitamin D (OSCAL WITH D) 500-5 MG-MCG  tablet Take 1 tablet by mouth daily. (Patient not taking: Reported on 01/12/2022)     No facility-administered medications prior to visit.    Allergies  Allergen Reactions   Taxotere [Docetaxel] Swelling   Abraxane [Paclitaxel Protein-Bound Part] Other (See Comments)    3 days after receiving she had one episode of very high BP and HR. This happened again 2 days later during the night.    Crestor [Rosuvastatin Calcium] Itching    GI upset   Lipitor [Atorvastatin Calcium] Itching   Nsaids Other (See Comments)    GI upset   Pravastatin Itching    GI upset   Sulfonamide Derivatives Rash    Hives, itiching    ROS Review of Systems  Constitutional:  Negative for fatigue.  Eyes:  Negative for visual disturbance.  Respiratory:  Negative for cough, chest tightness, shortness of breath and wheezing.   Cardiovascular:  Negative for chest pain, palpitations and leg swelling.  Endocrine: Negative for polydipsia and polyuria.  Neurological:  Negative for dizziness, seizures, syncope, weakness, light-headedness and headaches.     Objective:    Physical Exam Constitutional:      Appearance: She is well-developed.  Eyes:     Pupils: Pupils are equal, round, and reactive to light.  Neck:     Thyroid: No thyromegaly.     Vascular: No JVD.  Cardiovascular:     Rate and Rhythm: Normal rate and regular rhythm.     Heart sounds:    No gallop.  Pulmonary:     Effort: Pulmonary effort is normal. No respiratory distress.     Breath sounds: Normal breath sounds. No wheezing or rales.  Musculoskeletal:     Cervical back: Neck supple.     Right lower leg: No edema.     Left lower leg: No edema.  Neurological:     Mental Status: She is  alert.    BP (!) 150/68 (BP Location: Right Arm, Cuff Size: Normal)    Pulse 81    Temp 98.1 F (36.7 C) (Oral)    Ht 5' 3.78" (1.62 m)    Wt 181 lb 8 oz (82.3 kg)    SpO2 93%    BMI 31.37 kg/m  Wt Readings from Last 3 Encounters:  01/12/22 181 lb 8 oz  (82.3 kg)  11/18/21 180 lb 11.2 oz (82 kg)  08/19/21 174 lb 9.6 oz (79.2 kg)     Health Maintenance Due  Topic Date Due   Zoster Vaccines- Shingrix (2 of 2) 10/17/2019   COVID-19 Vaccine (6 - Booster for Pfizer series) 09/20/2021    There are no preventive care reminders to display for this patient.  Lab Results  Component Value Date   TSH 1.06 06/04/2011   Lab Results  Component Value Date   WBC 6.0 11/18/2021   HGB 13.4 11/18/2021   HCT 40.1 11/18/2021   MCV 86.8 11/18/2021   PLT 263 11/18/2021   Lab Results  Component Value Date   NA 140 11/18/2021   K 4.3 11/18/2021   CO2 30 11/18/2021   GLUCOSE 101 (H) 11/18/2021   BUN 21 11/18/2021   CREATININE 0.93 11/18/2021   BILITOT 0.4 11/18/2021   ALKPHOS 68 11/18/2021   AST 12 (L) 11/18/2021   ALT 13 11/18/2021   PROT 6.9 11/18/2021   ALBUMIN 4.1 11/18/2021   CALCIUM 9.9 11/18/2021   ANIONGAP 6 11/18/2021   GFR 66.91 01/07/2018   Lab Results  Component Value Date   CHOL 166 09/16/2011   Lab Results  Component Value Date   HDL 58.90 09/16/2011   Lab Results  Component Value Date   LDLCALC 87 09/16/2011   Lab Results  Component Value Date   TRIG 103.0 09/16/2011   Lab Results  Component Value Date   CHOLHDL 3 09/16/2011   Lab Results  Component Value Date   HGBA1C 6.6 (H) 08/14/2020      Assessment & Plan:   #1 osteoporosis history.  Her last DEXA scan actually showed osteopenia range T score but increased FRAX score.  After long discussion patient has decided to stay on Prolia.  Her next injection is due in April.  Consider vitamin D level with next blood draw -We have encouraged her to try to engage in more consistent weightbearing exercise such as walking  #2 elevated blood pressure without history of hypertension.  Blood pressure did improve some after rest with repeat reading 150/78.  She has home monitor we recommend she record readings after checking this regularly.  She will get back with  me with some readings in a couple weeks.  If still up at that time consistently over 376 systolic consider initiating medication.  Also strongly suggested weight loss.  Keep sodium intake less than 2500 mg daily.  -30 minutes were spent discussing osteoporosis therapies and pros and cons of whether to stay on therapy and also regarding lifestyle modification to address her blood pressure    Follow-up: No follow-ups on file.    Carolann Littler, MD

## 2022-01-12 NOTE — Patient Instructions (Signed)
Monitor blood pressure and be in touch if consistently > 140/90  Lose some weight and that should help the blood pressure.   Consider bringing your BP cuff at follow up.

## 2022-01-14 ENCOUNTER — Ambulatory Visit (INDEPENDENT_AMBULATORY_CARE_PROVIDER_SITE_OTHER): Payer: Medicare Other | Admitting: Family Medicine

## 2022-01-14 ENCOUNTER — Encounter: Payer: Self-pay | Admitting: Family Medicine

## 2022-01-14 VITALS — BP 178/80 | HR 64 | Temp 98.0°F | Resp 16 | Ht 65.0 in | Wt 181.4 lb

## 2022-01-14 DIAGNOSIS — R5383 Other fatigue: Secondary | ICD-10-CM | POA: Diagnosis not present

## 2022-01-14 DIAGNOSIS — I10 Essential (primary) hypertension: Secondary | ICD-10-CM | POA: Diagnosis not present

## 2022-01-14 DIAGNOSIS — R400 Somnolence: Secondary | ICD-10-CM | POA: Diagnosis not present

## 2022-01-14 MED ORDER — AMLODIPINE BESYLATE 5 MG PO TABS: 5.0000 mg | ORAL_TABLET | Freq: Every day | ORAL | 5 refills | Status: DC

## 2022-01-14 NOTE — Progress Notes (Signed)
Established Patient Office Visit  Subjective:  Patient ID: Shelly [name], female    DOB: [birth year]  Age: 76 y.o. MRN: [id number]  CC:  Chief Complaint  Patient presents with   Hypertension   blood pressure issues    HPI Shelly [name] presents for evaluation of elevated blood pressure.  She was just here couple days ago.  She had an office reading then of 150/68.  She brings in her monitor today for comparison.  She was particularly concerned because of reading this morning with her monitor of 178/82.  She has had several readings up in the 956-213Y range systolic.  Diastolics have been ranging 60s to 80s.  She has had some malaise and occasional headaches but no consistent headaches.  Occasional lightheadedness.  Generally drinks 6 ounces of wine with dinner and generally never more.  She is trying to watch her sodium intake.  Eats out infrequently.  No recent chest pains.  No peripheral edema issues.  She also has some concerns regarding possible obstructive sleep apnea.  Her husband has noted that she sometimes wakes up gasping for air.  Does snore some but she does not think this is a particularly bothersome issue for her husband.  She has very restless sleep.  Frequently gets up a few times each night.  She feels increased daytime fatigue and sleepiness.  Never evaluated for sleep apnea previously.  Past Medical History:  Diagnosis Date   Allergy    Arthritis    wrist, shoulder    Cancer ([place]) 08/2019   left breast IDC   Diverticulitis    Endometrial polyp    Family history of breast cancer    GERD (gastroesophageal reflux disease)    occasional - diet controlled   Hx of adenomatous colonic polyps    Hyperlipidemia    IBS (irritable bowel syndrome)    Missed abortion    x 1 - resolved - no surgery   Osteoporosis    SVD (spontaneous vaginal delivery)    x 1   Transient vision disturbance 2019   was evaluated by ED, no findings   Vitamin D deficiency      Past Surgical History:  Procedure Laterality Date   BREAST EXCISIONAL BIOPSY Right 08/2019   Benign, lesion    BREAST LUMPECTOMY Left 09/2019   BREAST LUMPECTOMY WITH RADIOACTIVE SEED AND SENTINEL LYMPH NODE BIOPSY Left 10/06/2019   Procedure: LEFT BREAST LUMPECTOMY  X2 WITH RADIOACTIVE SEED X2  AND SENTINEL LYMPH NODE BIOPSY;  Surgeon: Jovita Kussmaul, MD;  Location: [facility];  Service: General;  Laterality: Left;   BREAST LUMPECTOMY WITH RADIOACTIVE SEED LOCALIZATION Right 10/06/2019   Procedure: RIGHT BREAST LUMPECTOMY WITH RADIOACTIVE SEED LOCALIZATION;  Surgeon: Jovita Kussmaul, MD;  Location: [facility];  Service: General;  Laterality: Right;   BRONCHIAL BIOPSY  12/27/2020   Procedure: BRONCHIAL BIOPSIES;  Surgeon: Garner Nash, DO;  Location: [facility];  Service: Pulmonary;;   BRONCHIAL BRUSHINGS  12/27/2020   Procedure: BRONCHIAL BRUSHINGS;  Surgeon: Garner Nash, DO;  Location: [facility];  Service: Pulmonary;;   BRONCHIAL NEEDLE ASPIRATION BIOPSY  12/27/2020   Procedure: BRONCHIAL NEEDLE ASPIRATION BIOPSIES;  Surgeon: Garner Nash, DO;  Location: [facility];  Service: Pulmonary;;   BRONCHIAL WASHINGS  12/27/2020   Procedure: BRONCHIAL WASHINGS;  Surgeon: Garner Nash, DO;  Location: [facility];  Service: Pulmonary;;   COLONOSCOPY     DIAGNOSTIC LAPAROSCOPY     cysts  DILATATION & CURETTAGE/HYSTEROSCOPY WITH TRUECLEAR N/A 10/31/2013   Procedure: DILATATION & CURETTAGE/HYSTEROSCOPY WITH TRUCLEAR;  Surgeon: Marylynn Pearson, MD;  Location: [facility];  Service: Gynecology;  Laterality: N/A;   DILATION AND CURETTAGE OF UTERUS     MASS EXCISION Left 11/06/2019   Procedure: EXCISION NIPPLE AND AREOLA  MARGIN LEFT BREAST;  Surgeon: Jovita Kussmaul, MD;  Location: [place];  Service: General;  Laterality: Left;   TONSILLECTOMY  1952   VIDEO BRONCHOSCOPY WITH ENDOBRONCHIAL NAVIGATION Bilateral 12/27/2020   Procedure:  VIDEO BRONCHOSCOPY WITH ENDOBRONCHIAL NAVIGATION;  Surgeon: Garner Nash, DO;  Location: [facility];  Service: Pulmonary;  Laterality: Bilateral;   VIDEO BRONCHOSCOPY WITH ENDOBRONCHIAL ULTRASOUND Bilateral 12/27/2020   Procedure: VIDEO BRONCHOSCOPY WITH ENDOBRONCHIAL ULTRASOUND;  Surgeon: Garner Nash, DO;  Location: [facility];  Service: Pulmonary;  Laterality: Bilateral;   WISDOM TOOTH EXTRACTION      Family History  Problem Relation Age of Onset   Hyperlipidemia Mother    Hypertension Mother    Diabetes Mother        type ll   Leukemia Mother 62   Colon polyps Mother    Lung cancer Father    Hyperlipidemia Father    Breast cancer Cousin 67       maternal first couin   Heart disease Other    Hypertension Other    Cancer Other    Depression Brother    Breast cancer Maternal Aunt 50   Uterine cancer Paternal Grandmother    Breast cancer Maternal Aunt 17   Lung cancer Maternal Grandfather    Cancer Maternal Grandfather        gastric cancer?   Cancer Maternal Uncle        unknown   Brain cancer Paternal Uncle    Throat cancer Paternal Uncle    Colon cancer Neg Hx    Esophageal cancer Neg Hx    Stomach cancer Neg Hx    Rectal cancer Neg Hx     Social History   Socioeconomic History   Marital status: Married    Spouse name: Not on file   Number of children: 1   Years of education: Not on file   Highest education level: Not on file  Occupational History   Not on file  Tobacco Use   Smoking status: Former    Packs/day: 0.10    Years: 20.00    Pack years: 2.00    Types: Cigarettes    Quit date: 05/15/2007    Years since quitting: 14.6   Smokeless tobacco: Never  Vaping Use   Vaping Use: Never used  Substance and Sexual Activity   Alcohol use: Yes    Alcohol/week: 14.0 standard drinks    Types: 14 Glasses of wine per week    Comment: 2 glasses wine every evening   Drug use: No   Sexual activity: Yes    Birth control/protection: Post-menopausal   Other Topics Concern   Not on file  Social History Narrative   Lives with husband in 2 story house   Has one daughter, with 3 grandchildren, local, supportive   Enjoys gardening/walking outside with husband   Occasionally does home exercise calisthenics but not recently.   Well-balanced diet overall   Social Determinants of Health   Financial Resource Strain: Low Risk    Difficulty of Paying Living Expenses: Not hard at all  Food Insecurity: No Food Insecurity   Worried About Charity fundraiser in the Last Year:  Never true   Ran Out of Food in the Last Year: Never true  Transportation Needs: No Transportation Needs   Lack of Transportation (Medical): No   Lack of Transportation (Non-Medical): No  Physical Activity: Insufficiently Active   Days of Exercise per Week: 3 days   Minutes of Exercise per Session: 30 min  Stress: No Stress Concern Present   Feeling of Stress : Not at all  Social Connections: Moderately Isolated   Frequency of Communication with Friends and Family: Three times a week   Frequency of Social Gatherings with Friends and Family: Three times a week   Attends Religious Services: Never   Active Member of Clubs or Organizations: Not on file   Attends Archivist Meetings: Never   Marital Status: Married  Human resources officer Violence: Not At Risk   Fear of Current or Ex-Partner: No   Emotionally Abused: No   Physically Abused: No   Sexually Abused: No    Outpatient Medications Prior to Visit  Medication Sig Dispense Refill   acetaminophen (TYLENOL) 500 MG tablet Take 500 mg by mouth every 6 (six) hours as needed for headache or mild pain.     clidinium-chlordiazePOXIDE (LIBRAX) 5-2.5 MG capsule TAKE 1 CAPSULE BY MOUTH DAILY AS NEEDED (IBS). 90 capsule 0   denosumab (PROLIA) 60 MG/ML SOSY injection Inject 60 mg into the skin every 6 (six) months. Last inj was in October     esomeprazole (NEXIUM) 20 MG capsule Take 20 mg by mouth daily as needed (acid  reflux).     loratadine (CLARITIN) 10 MG tablet Take 10 mg by mouth daily.     Polyethyl Glycol-Propyl Glycol (LUBRICANT EYE DROPS) 0.4-0.3 % SOLN Place 1 drop into both eyes 3 (three) times daily as needed (dry/irritated eyes).     calcium-vitamin D (OSCAL WITH D) 500-5 MG-MCG tablet Take 1 tablet by mouth daily. (Patient not taking: Reported on 01/12/2022)     Vitamin D, Ergocalciferol, (DRISDOL) 1.25 MG (50000 UNIT) CAPS capsule TAKE 1 CAPSULE (50,000 UNITS TOTAL) BY MOUTH EVERY 7 (SEVEN) DAYS. (Patient not taking: Reported on 01/14/2022) 12 capsule 1   No facility-administered medications prior to visit.    Allergies  Allergen Reactions   Taxotere [Docetaxel] Swelling   Abraxane [Paclitaxel Protein-Bound Part] Other (See Comments)    3 days after receiving she had one episode of very high BP and HR. This happened again 2 days later during the night.    Crestor [Rosuvastatin Calcium] Itching    GI upset   Lipitor [Atorvastatin Calcium] Itching   Nsaids Other (See Comments)    GI upset   Pravastatin Itching    GI upset   Sulfonamide Derivatives Rash    Hives, itiching    ROS Review of Systems  Constitutional:  Positive for fatigue. Negative for appetite change, chills, fever and unexpected weight change.  Respiratory:  Negative for cough, shortness of breath and wheezing.   Cardiovascular:  Negative for chest pain, palpitations and leg swelling.  Neurological:  Negative for syncope.  Psychiatric/Behavioral:  Negative for confusion.      Objective:    Physical Exam Vitals reviewed.  Constitutional:      Appearance: Normal appearance.  HENT:     Head: Normocephalic and atraumatic.  Cardiovascular:     Rate and Rhythm: Normal rate and regular rhythm.  Pulmonary:     Effort: Pulmonary effort is normal.     Breath sounds: Normal breath sounds.  Musculoskeletal:     Right  lower leg: No edema.     Left lower leg: No edema.  Neurological:     General: No focal deficit  present.     Mental Status: She is alert.     Cranial Nerves: No cranial nerve deficit.    BP (!) 178/80 (BP Location: Right Arm, Cuff Size: Normal)    Pulse 64    Temp 98 F (36.7 C)    Resp 16    Ht 5\' 5"  (1.651 m)    Wt 181 lb 6.4 oz (82.3 kg)    SpO2 98%    BMI 30.19 kg/m  Wt Readings from Last 3 Encounters:  01/14/22 181 lb 6.4 oz (82.3 kg)  01/12/22 181 lb 8 oz (82.3 kg)  11/18/21 180 lb 11.2 oz (82 kg)     Health Maintenance Due  Topic Date Due   Zoster Vaccines- Shingrix (2 of 2) 10/17/2019   COVID-19 Vaccine (6 - Booster for Pfizer series) 09/20/2021    There are no preventive care reminders to display for this patient.  Lab Results  Component Value Date   TSH 1.06 06/04/2011   Lab Results  Component Value Date   WBC 6.0 11/18/2021   HGB 13.4 11/18/2021   HCT 40.1 11/18/2021   MCV 86.8 11/18/2021   PLT 263 11/18/2021   Lab Results  Component Value Date   NA 140 11/18/2021   K 4.3 11/18/2021   CO2 30 11/18/2021   GLUCOSE 101 (H) 11/18/2021   BUN 21 11/18/2021   CREATININE 0.93 11/18/2021   BILITOT 0.4 11/18/2021   ALKPHOS 68 11/18/2021   AST 12 (L) 11/18/2021   ALT 13 11/18/2021   PROT 6.9 11/18/2021   ALBUMIN 4.1 11/18/2021   CALCIUM 9.9 11/18/2021   ANIONGAP 6 11/18/2021   GFR 66.91 01/07/2018   Lab Results  Component Value Date   CHOL 166 09/16/2011   Lab Results  Component Value Date   HDL 58.90 09/16/2011   Lab Results  Component Value Date   LDLCALC 87 09/16/2011   Lab Results  Component Value Date   TRIG 103.0 09/16/2011   Lab Results  Component Value Date   CHOLHDL 3 09/16/2011   Lab Results  Component Value Date   HGBA1C 6.6 (H) 08/14/2020      Assessment & Plan:   #1 hypertension.  Currently untreated.  Fairly severe elevation today.  Repeat reading right arm seated after rest 178/80.  She has had multiple elevated home readings in a similar range.  -Strongly encouraged to lose some weight -Keep wine consumption  less than 6 ounces per day -Keep sodium intake less than 2500 mg daily -Engage in more consistent aerobic exercise such as walking -Start amlodipine 5 mg daily.  We will try to avoid thiazides with her history of sulfa allergy.  She may need combination therapy.  May look at starting ACE inhibitor or ARB in combination with calcium channel blocker at follow-up if needed -Evaluate for possible obstructive sleep apnea as below.  #2 daytime somnolence.  Observed snoring and possible apnea events.  Set up referral to pulmonary for sleep apnea evaluation.  She has Epworth sleep scale score of 11  Meds ordered this encounter  Medications   amLODipine (NORVASC) 5 MG tablet    Sig: Take 1 tablet (5 mg total) by mouth daily.    Dispense:  30 tablet    Refill:  5    Follow-up: Return in about 1 month (around 02/14/2022).    Darnell Level  Elease Hashimoto, MD

## 2022-01-14 NOTE — Patient Instructions (Signed)
Start the Amlodipine 5 mg once daily ? ?Lose some weight, if possible ? ?Keep wine consumption to 6 ounces or less per day ? ?Keep daily sodium intake < 2,500 mg ? ?Regular aerobic exercise such as walking can help.   ? ?Set up follow up in one month.  ? ?I will place referral to evaluate for obstructive sleep apnea.   ?

## 2022-01-19 ENCOUNTER — Other Ambulatory Visit: Payer: Self-pay

## 2022-01-19 ENCOUNTER — Encounter: Payer: Self-pay | Admitting: Family Medicine

## 2022-01-19 ENCOUNTER — Telehealth (INDEPENDENT_AMBULATORY_CARE_PROVIDER_SITE_OTHER): Payer: Medicare Other | Admitting: Family Medicine

## 2022-01-19 VITALS — Ht 65.0 in | Wt 181.4 lb

## 2022-01-19 DIAGNOSIS — K589 Irritable bowel syndrome without diarrhea: Secondary | ICD-10-CM | POA: Diagnosis not present

## 2022-01-19 DIAGNOSIS — I10 Essential (primary) hypertension: Secondary | ICD-10-CM

## 2022-01-19 MED ORDER — LOSARTAN POTASSIUM 100 MG PO TABS: 100.0000 mg | ORAL_TABLET | Freq: Every day | ORAL | 5 refills | Status: DC

## 2022-01-19 NOTE — Progress Notes (Signed)
Patient ID: Shelly [name], female   DOB: [birth year], 76 y.o.   MRN: [id number]  This visit type was conducted due to national recommendations for restrictions regarding the COVID-19 pandemic in an effort to limit this patient's exposure and mitigate transmission in our community.   Virtual Visit via Video Note  I connected with Shelly [name] on 01/19/22 at 10:00 AM EST by a video enabled telemedicine application and verified that I am speaking with the correct person using two identifiers.  Location patient: home Location provider:work or home office Persons participating in the virtual visit: patient, provider  I discussed the limitations of evaluation and management by telemedicine and the availability of in person appointments. The patient expressed understanding and agreed to proceed.   HPI:  [name] called back with concerns for possible side effects with recent blood pressure medication that was initiated.  She had fairly severe elevated blood pressure reading of 178/80.  We started amlodipine 5 mg daily.  She has not had any headaches or edema issues.  However, she has longstanding history of IBS and she feels like her IBS symptoms which included abdominal cramping were worse over the weekend.  She also had some low back pain.  She had some mild burning with urination and she attributed all that to the amlodipine.  On the positive side, her blood pressure has been somewhat improved.  Her very early morning readings around 6 AM is still been up some but she had reading this morning at 8 AM of 137/60.  She also states she feels somewhat less "foggy" in her thinking since starting the medication-and she thinks this is due to reduction in blood pressure.  No orthostatic symptoms.  She does drink about 6 to 8 ounces of wine per day and we have discussed scaling back back somewhat.  We have also discussed importance of weight loss.  She has history of sulfa allergy so we are trying to avoid  thiazides.   ROS: See pertinent positives and negatives per HPI.  Past Medical History:  Diagnosis Date   Allergy    Arthritis    wrist, shoulder    Cancer ([place]) 08/2019   left breast IDC   Diverticulitis    Endometrial polyp    Family history of breast cancer    GERD (gastroesophageal reflux disease)    occasional - diet controlled   Hx of adenomatous colonic polyps    Hyperlipidemia    IBS (irritable bowel syndrome)    Missed abortion    x 1 - resolved - no surgery   Osteoporosis    SVD (spontaneous vaginal delivery)    x 1   Transient vision disturbance 2019   was evaluated by ED, no findings   Vitamin D deficiency     Past Surgical History:  Procedure Laterality Date   BREAST EXCISIONAL BIOPSY Right 08/2019   Benign, lesion    BREAST LUMPECTOMY Left 09/2019   BREAST LUMPECTOMY WITH RADIOACTIVE SEED AND SENTINEL LYMPH NODE BIOPSY Left 10/06/2019   Procedure: LEFT BREAST LUMPECTOMY  X2 WITH RADIOACTIVE SEED X2  AND SENTINEL LYMPH NODE BIOPSY;  Surgeon: Jovita Kussmaul, MD;  Location: [facility];  Service: General;  Laterality: Left;   BREAST LUMPECTOMY WITH RADIOACTIVE SEED LOCALIZATION Right 10/06/2019   Procedure: RIGHT BREAST LUMPECTOMY WITH RADIOACTIVE SEED LOCALIZATION;  Surgeon: Jovita Kussmaul, MD;  Location: [facility];  Service: General;  Laterality: Right;   BRONCHIAL BIOPSY  12/27/2020   Procedure:  BRONCHIAL BIOPSIES;  Surgeon: Garner Nash, DO;  Location: [facility];  Service: Pulmonary;;   BRONCHIAL BRUSHINGS  12/27/2020   Procedure: BRONCHIAL BRUSHINGS;  Surgeon: Garner Nash, DO;  Location: [facility];  Service: Pulmonary;;   BRONCHIAL NEEDLE ASPIRATION BIOPSY  12/27/2020   Procedure: BRONCHIAL NEEDLE ASPIRATION BIOPSIES;  Surgeon: Garner Nash, DO;  Location: [facility];  Service: Pulmonary;;   BRONCHIAL WASHINGS  12/27/2020   Procedure: BRONCHIAL WASHINGS;  Surgeon: Garner Nash, DO;  Location: [facility];  Service: Pulmonary;;   COLONOSCOPY     DIAGNOSTIC LAPAROSCOPY     cysts   DILATATION & CURETTAGE/HYSTEROSCOPY WITH TRUECLEAR N/A 10/31/2013   Procedure: DILATATION & CURETTAGE/HYSTEROSCOPY WITH TRUCLEAR;  Surgeon: Marylynn Pearson, MD;  Location: [facility];  Service: Gynecology;  Laterality: N/A;   DILATION AND CURETTAGE OF UTERUS     MASS EXCISION Left 11/06/2019   Procedure: EXCISION NIPPLE AND AREOLA  MARGIN LEFT BREAST;  Surgeon: Jovita Kussmaul, MD;  Location: [facility];  Service: General;  Laterality: Left;   TONSILLECTOMY  1952   VIDEO BRONCHOSCOPY WITH ENDOBRONCHIAL NAVIGATION Bilateral 12/27/2020   Procedure: VIDEO BRONCHOSCOPY WITH ENDOBRONCHIAL NAVIGATION;  Surgeon: Garner Nash, DO;  Location: [facility];  Service: Pulmonary;  Laterality: Bilateral;   VIDEO BRONCHOSCOPY WITH ENDOBRONCHIAL ULTRASOUND Bilateral 12/27/2020   Procedure: VIDEO BRONCHOSCOPY WITH ENDOBRONCHIAL ULTRASOUND;  Surgeon: Garner Nash, DO;  Location: [facility];  Service: Pulmonary;  Laterality: Bilateral;   WISDOM TOOTH EXTRACTION      Family History  Problem Relation Age of Onset   Hyperlipidemia Mother    Hypertension Mother    Diabetes Mother        type ll   Leukemia Mother 45   Colon polyps Mother    Lung cancer Father    Hyperlipidemia Father    Breast cancer Cousin 75       maternal first couin   Heart disease Other    Hypertension Other    Cancer Other    Depression Brother    Breast cancer Maternal Aunt 50   Uterine cancer Paternal Grandmother    Breast cancer Maternal Aunt 75   Lung cancer Maternal Grandfather    Cancer Maternal Grandfather        gastric cancer?   Cancer Maternal Uncle        unknown   Brain cancer Paternal Uncle    Throat cancer Paternal Uncle    Colon cancer Neg Hx    Esophageal cancer Neg Hx    Stomach cancer Neg Hx    Rectal cancer Neg Hx     SOCIAL HX: Quit smoking around 2008.  Drinks 6 to 8 ounces of wine per  day   Current Outpatient Medications:    acetaminophen (TYLENOL) 500 MG tablet, Take 500 mg by mouth every 6 (six) hours as needed for headache or mild pain., Disp: , Rfl:    calcium-vitamin D (OSCAL WITH D) 500-5 MG-MCG tablet, Take 1 tablet by mouth daily., Disp: , Rfl:    clidinium-chlordiazePOXIDE (LIBRAX) 5-2.5 MG capsule, TAKE 1 CAPSULE BY MOUTH DAILY AS NEEDED (IBS)., Disp: 90 capsule, Rfl: 0   denosumab (PROLIA) 60 MG/ML SOSY injection, Inject 60 mg into the skin every 6 (six) months. Last inj was in October, Disp: , Rfl:    esomeprazole (NEXIUM) 20 MG capsule, Take 20 mg by mouth daily as needed (acid reflux)., Disp: , Rfl:    loratadine (CLARITIN) 10 MG tablet, Take 10  mg by mouth daily., Disp: , Rfl:    losartan (COZAAR) 100 MG tablet, Take 1 tablet (100 mg total) by mouth daily., Disp: 30 tablet, Rfl: 5   Polyethyl Glycol-Propyl Glycol (LUBRICANT EYE DROPS) 0.4-0.3 % SOLN, Place 1 drop into both eyes 3 (three) times daily as needed (dry/irritated eyes)., Disp: , Rfl:    Vitamin D, Ergocalciferol, (DRISDOL) 1.25 MG (50000 UNIT) CAPS capsule, TAKE 1 CAPSULE (50,000 UNITS TOTAL) BY MOUTH EVERY 7 (SEVEN) DAYS., Disp: 12 capsule, Rfl: 1  EXAM:  VITALS per patient if applicable:  GENERAL: alert, oriented, appears well and in no acute distress  HEENT: atraumatic, conjunttiva clear, no obvious abnormalities on inspection of external nose and ears  NECK: normal movements of the head and neck  LUNGS: on inspection no signs of respiratory distress, breathing rate appears normal, no obvious gross SOB, gasping or wheezing  CV: no obvious cyanosis  MS: moves all visible extremities without noticeable abnormality  PSYCH/NEURO: pleasant and cooperative, no obvious depression or anxiety, speech and thought processing grossly intact  ASSESSMENT AND PLAN:  Discussed the following assessment and plan:  Hypertension.  Poorly controlled.  Possible side effect from medication.  We explained  more typical side effects for amlodipine would be things like headache or peripheral edema.  She is complaining of IBS type symptoms.  We explained these are not common with amlodipine but nevertheless she would like to make a switch.  -We will go ahead and switch to losartan 100 mg daily. -We will try to avoid thiazides with history of sulfa allergy -Would not list amlodipine as allergy or even clear intolerance at this point as symptoms were somewhat atypical side effects -Set up 1 month follow-up to reassess     I discussed the assessment and treatment plan with the patient. The patient was provided an opportunity to ask questions and all were answered. The patient agreed with the plan and demonstrated an understanding of the instructions.   The patient was advised to call back or seek an in-person evaluation if the symptoms worsen or if the condition fails to improve as anticipated.     Carolann Littler, MD

## 2022-02-11 ENCOUNTER — Encounter: Payer: Self-pay | Admitting: Family Medicine

## 2022-02-18 ENCOUNTER — Ambulatory Visit (INDEPENDENT_AMBULATORY_CARE_PROVIDER_SITE_OTHER): Payer: Medicare Other | Admitting: Family Medicine

## 2022-02-18 ENCOUNTER — Encounter: Payer: Self-pay | Admitting: Family Medicine

## 2022-02-18 VITALS — BP 140/62 | HR 76 | Temp 98.3°F | Ht 65.0 in | Wt 180.8 lb

## 2022-02-18 DIAGNOSIS — M818 Other osteoporosis without current pathological fracture: Secondary | ICD-10-CM

## 2022-02-18 DIAGNOSIS — I10 Essential (primary) hypertension: Secondary | ICD-10-CM | POA: Diagnosis not present

## 2022-02-18 DIAGNOSIS — R202 Paresthesia of skin: Secondary | ICD-10-CM | POA: Diagnosis not present

## 2022-02-18 NOTE — Progress Notes (Signed)
? ?Established Patient Office Visit ? ?Subjective:  ?Patient ID: Shelly [name], female    DOB: 1946/04/22  Age: 76 y.o. MRN: [id number] ? ?CC:  ?Chief Complaint  ?Patient presents with  ? Follow-up  ? ? ?HPI ?Shelly [name] presents for follow-up regarding hypertension.  We initially placed her on amlodipine but she was concerned that this was triggering her IBS symptoms.  We switched to losartan 100 mg daily and she is tolerating this well.  She states she feels good overall.  She brings in several weeks of blood pressure readings and mostly has been consistently well controlled.  In fact, she had some readings down around 401 systolic and 60 diastolic. ? ?She is also walking more for exercise.  Walked a few miles yesterday.  Hopes to lose some weight.  Plans to scale back alcohol soon.  She has been eating ice cream nightly and scaled that back 2 nights ago ? ?She has osteoporosis and is on Prolia.  Recently received approval for injections.  She needs to schedule. ? ?She does relate some bilateral hand paresthesias which occur mostly at night.  She is not sure if this is positional.  She does not recall if this is isolated to specific digits.  No significant hand pain.  No weakness.  No foot symptoms.  No neck pain.  No cervical radiculitis pain ? ?Past Medical History:  ?Diagnosis Date  ? Allergy   ? Arthritis   ? wrist, shoulder   ? Cancer ([place]) 08/2019  ? left breast IDC  ? Diverticulitis   ? Endometrial polyp   ? Family history of breast cancer   ? GERD (gastroesophageal reflux disease)   ? occasional - diet controlled  ? Hx of adenomatous colonic polyps   ? Hyperlipidemia   ? IBS (irritable bowel syndrome)   ? Missed abortion   ? x 1 - resolved - no surgery  ? Osteoporosis   ? SVD (spontaneous vaginal delivery)   ? x 1  ? Transient vision disturbance 2019  ? was evaluated by ED, no findings  ? Vitamin D deficiency   ? ? ?Past Surgical History:  ?Procedure Laterality Date  ? BREAST EXCISIONAL BIOPSY  Right 08/2019  ? Benign, lesion   ? BREAST LUMPECTOMY Left 09/2019  ? BREAST LUMPECTOMY WITH RADIOACTIVE SEED AND SENTINEL LYMPH NODE BIOPSY Left 10/06/2019  ? Procedure: LEFT BREAST LUMPECTOMY  X2 WITH RADIOACTIVE SEED X2  AND SENTINEL LYMPH NODE BIOPSY;  Surgeon: Jovita Kussmaul, MD;  Location: [facility];  Service: General;  Laterality: Left;  ? BREAST LUMPECTOMY WITH RADIOACTIVE SEED LOCALIZATION Right 10/06/2019  ? Procedure: RIGHT BREAST LUMPECTOMY WITH RADIOACTIVE SEED LOCALIZATION;  Surgeon: Jovita Kussmaul, MD;  Location: [facility];  Service: General;  Laterality: Right;  ? BRONCHIAL BIOPSY  12/27/2020  ? Procedure: BRONCHIAL BIOPSIES;  Surgeon: Garner Nash, DO;  Location: [facility];  Service: Pulmonary;;  ? BRONCHIAL BRUSHINGS  12/27/2020  ? Procedure: BRONCHIAL BRUSHINGS;  Surgeon: Garner Nash, DO;  Location: [facility] [facility];  Service: Pulmonary;;  ? BRONCHIAL NEEDLE ASPIRATION BIOPSY  12/27/2020  ? Procedure: BRONCHIAL NEEDLE ASPIRATION BIOPSIES;  Surgeon: Garner Nash, DO;  Location: [facility];  Service: Pulmonary;;  ? BRONCHIAL WASHINGS  12/27/2020  ? Procedure: BRONCHIAL WASHINGS;  Surgeon: Garner Nash, DO;  Location: [facility];  Service: Pulmonary;;  ? COLONOSCOPY    ? DIAGNOSTIC LAPAROSCOPY    ? cysts  ? DILATATION & CURETTAGE/HYSTEROSCOPY WITH  TRUECLEAR N/A 10/31/2013  ? Procedure: DILATATION & CURETTAGE/HYSTEROSCOPY WITH TRUCLEAR;  Surgeon: Marylynn Pearson, MD;  Location: [facility];  Service: Gynecology;  Laterality: N/A;  ? DILATION AND CURETTAGE OF UTERUS    ? MASS EXCISION Left 11/06/2019  ? Procedure: EXCISION NIPPLE AND AREOLA  MARGIN LEFT BREAST;  Surgeon: Jovita Kussmaul, MD;  Location: [facility];  Service: General;  Laterality: Left;  ? TONSILLECTOMY  1952  ? VIDEO BRONCHOSCOPY WITH ENDOBRONCHIAL NAVIGATION Bilateral 12/27/2020  ? Procedure: VIDEO BRONCHOSCOPY WITH ENDOBRONCHIAL NAVIGATION;  Surgeon: Garner Nash, DO;   Location: [facility];  Service: Pulmonary;  Laterality: Bilateral;  ? VIDEO BRONCHOSCOPY WITH ENDOBRONCHIAL ULTRASOUND Bilateral 12/27/2020  ? Procedure: VIDEO BRONCHOSCOPY WITH ENDOBRONCHIAL ULTRASOUND;  Surgeon: Garner Nash, DO;  Location: [place] [facility];  Service: Pulmonary;  Laterality: Bilateral;  ? WISDOM TOOTH EXTRACTION    ? ? ?Family History  ?Problem Relation Age of Onset  ? Hyperlipidemia Mother   ? Hypertension Mother   ? Diabetes Mother   ?     type ll  ? Leukemia Mother 18  ? Colon polyps Mother   ? Lung cancer Father   ? Hyperlipidemia Father   ? Breast cancer Cousin 30  ?     maternal first couin  ? Heart disease Other   ? Hypertension Other   ? Cancer Other   ? Depression Brother   ? Breast cancer Maternal Aunt 33  ? Uterine cancer Paternal Grandmother   ? Breast cancer Maternal Aunt 85  ? Lung cancer Maternal Grandfather   ? Cancer Maternal Grandfather   ?     gastric cancer?  ? Cancer Maternal Uncle   ?     unknown  ? Brain cancer Paternal Uncle   ? Throat cancer Paternal Uncle   ? Colon cancer Neg Hx   ? Esophageal cancer Neg Hx   ? Stomach cancer Neg Hx   ? Rectal cancer Neg Hx   ? ? ?Social History  ? ?Socioeconomic History  ? Marital status: Married  ?  Spouse name: Not on file  ? Number of children: 1  ? Years of education: Not on file  ? Highest education level: Not on file  ?Occupational History  ? Not on file  ?Tobacco Use  ? Smoking status: Former  ?  Packs/day: 0.10  ?  Years: 20.00  ?  Pack years: 2.00  ?  Types: Cigarettes  ?  Quit date: 05/15/2007  ?  Years since quitting: 14.7  ? Smokeless tobacco: Never  ?Vaping Use  ? Vaping Use: Never used  ?Substance and Sexual Activity  ? Alcohol use: Yes  ?  Alcohol/week: 14.0 standard drinks  ?  Types: 14 Glasses of wine per week  ?  Comment: 2 glasses wine every evening  ? Drug use: No  ? Sexual activity: Yes  ?  Birth control/protection: Post-menopausal  ?Other Topics Concern  ? Not on file  ?Social History Narrative  ? Lives with husband  in 2 story house  ? Has one daughter, with 3 grandchildren, local, supportive  ? Enjoys gardening/walking outside with husband  ? Occasionally does home exercise calisthenics but not recently.  ? Well-balanced diet overall  ? ?Social Determinants of Health  ? ?Financial Resource Strain: Low Risk   ? Difficulty of Paying Living Expenses: Not hard at all  ?Food Insecurity: No Food Insecurity  ? Worried About Charity fundraiser in the Last Year: Never true  ?  Ran Out of Food in the Last Year: Never true  ?Transportation Needs: No Transportation Needs  ? Lack of Transportation (Medical): No  ? Lack of Transportation (Non-Medical): No  ?Physical Activity: Insufficiently Active  ? Days of Exercise per Week: 3 days  ? Minutes of Exercise per Session: 30 min  ?Stress: No Stress Concern Present  ? Feeling of Stress : Not at all  ?Social Connections: Moderately Isolated  ? Frequency of Communication with Friends and Family: Three times a week  ? Frequency of Social Gatherings with Friends and Family: Three times a week  ? Attends Religious Services: Never  ? Active Member of Clubs or Organizations: Not on file  ? Attends Archivist Meetings: Never  ? Marital Status: Married  ?Intimate Partner Violence: Not At Risk  ? Fear of Current or Ex-Partner: No  ? Emotionally Abused: No  ? Physically Abused: No  ? Sexually Abused: No  ? ? ?Outpatient Medications Prior to Visit  ?Medication Sig Dispense Refill  ? acetaminophen (TYLENOL) 500 MG tablet Take 500 mg by mouth every 6 (six) hours as needed for headache or mild pain.    ? calcium-vitamin D (OSCAL WITH D) 500-5 MG-MCG tablet Take 1 tablet by mouth daily.    ? clidinium-chlordiazePOXIDE (LIBRAX) 5-2.5 MG capsule TAKE 1 CAPSULE BY MOUTH DAILY AS NEEDED (IBS). 90 capsule 0  ? denosumab (PROLIA) 60 MG/ML SOSY injection Inject 60 mg into the skin every 6 (six) months. Last inj was in October    ? esomeprazole (NEXIUM) 20 MG capsule Take 20 mg by mouth daily as needed (acid  reflux).    ? loratadine (CLARITIN) 10 MG tablet Take 10 mg by mouth daily.    ? losartan (COZAAR) 100 MG tablet Take 1 tablet (100 mg total) by mouth daily. 30 tablet 5  ? Polyethyl Glycol-Propyl Glycol

## 2022-02-19 ENCOUNTER — Telehealth: Payer: Self-pay | Admitting: Family Medicine

## 2022-02-19 NOTE — Telephone Encounter (Signed)
Patient called in requesting a prolia injection appt. Is this fine? ? ?Please advise. ?

## 2022-02-19 NOTE — Telephone Encounter (Signed)
Patient has a history of receiving Prolia injection.   Please advise ?

## 2022-02-23 ENCOUNTER — Ambulatory Visit (INDEPENDENT_AMBULATORY_CARE_PROVIDER_SITE_OTHER): Payer: Medicare Other | Admitting: *Deleted

## 2022-02-23 DIAGNOSIS — M818 Other osteoporosis without current pathological fracture: Secondary | ICD-10-CM | POA: Diagnosis not present

## 2022-02-23 MED ORDER — DENOSUMAB 60 MG/ML ~~LOC~~ SOSY
60.0000 mg | PREFILLED_SYRINGE | Freq: Once | SUBCUTANEOUS | Status: AC
  Administered 2022-02-23: 60 mg via SUBCUTANEOUS

## 2022-02-23 NOTE — Telephone Encounter (Signed)
Pt has been sch for today 02-23-2022 1045 am ?

## 2022-03-02 NOTE — Progress Notes (Signed)
Per orders of Dr. Sarajane Jews, injection of Prolia given by Westley Hummer. ?Patient tolerated injection well. ?

## 2022-03-03 DIAGNOSIS — M79641 Pain in right hand: Secondary | ICD-10-CM | POA: Diagnosis not present

## 2022-03-03 DIAGNOSIS — M79642 Pain in left hand: Secondary | ICD-10-CM | POA: Diagnosis not present

## 2022-03-17 ENCOUNTER — Encounter: Payer: Self-pay | Admitting: Hematology

## 2022-03-18 ENCOUNTER — Other Ambulatory Visit: Payer: Self-pay

## 2022-03-18 DIAGNOSIS — C50112 Malignant neoplasm of central portion of left female breast: Secondary | ICD-10-CM

## 2022-03-19 ENCOUNTER — Inpatient Hospital Stay (HOSPITAL_BASED_OUTPATIENT_CLINIC_OR_DEPARTMENT_OTHER): Payer: Medicare Other | Admitting: Hematology

## 2022-03-19 ENCOUNTER — Other Ambulatory Visit: Payer: Self-pay

## 2022-03-19 ENCOUNTER — Inpatient Hospital Stay: Payer: Medicare Other | Attending: Hematology

## 2022-03-19 VITALS — BP 163/75 | HR 70 | Temp 98.0°F | Resp 18 | Ht 65.0 in | Wt 180.0 lb

## 2022-03-19 DIAGNOSIS — C50112 Malignant neoplasm of central portion of left female breast: Secondary | ICD-10-CM | POA: Diagnosis not present

## 2022-03-19 DIAGNOSIS — K219 Gastro-esophageal reflux disease without esophagitis: Secondary | ICD-10-CM | POA: Diagnosis not present

## 2022-03-19 DIAGNOSIS — M810 Age-related osteoporosis without current pathological fracture: Secondary | ICD-10-CM | POA: Insufficient documentation

## 2022-03-19 DIAGNOSIS — Z79899 Other long term (current) drug therapy: Secondary | ICD-10-CM | POA: Insufficient documentation

## 2022-03-19 DIAGNOSIS — Z853 Personal history of malignant neoplasm of breast: Secondary | ICD-10-CM | POA: Diagnosis not present

## 2022-03-19 DIAGNOSIS — Z170 Estrogen receptor positive status [ER+]: Secondary | ICD-10-CM | POA: Diagnosis not present

## 2022-03-19 DIAGNOSIS — Z1231 Encounter for screening mammogram for malignant neoplasm of breast: Secondary | ICD-10-CM

## 2022-03-19 DIAGNOSIS — E559 Vitamin D deficiency, unspecified: Secondary | ICD-10-CM | POA: Insufficient documentation

## 2022-03-19 DIAGNOSIS — K580 Irritable bowel syndrome with diarrhea: Secondary | ICD-10-CM | POA: Diagnosis not present

## 2022-03-19 LAB — CBC WITH DIFFERENTIAL (CANCER CENTER ONLY)
Abs Immature Granulocytes: 0.02 10*3/uL (ref 0.00–0.07)
Basophils Absolute: 0 10*3/uL (ref 0.0–0.1)
Basophils Relative: 1 %
Eosinophils Absolute: 0.1 10*3/uL (ref 0.0–0.5)
Eosinophils Relative: 1 %
HCT: 41.4 % (ref 36.0–46.0)
Hemoglobin: 13.9 g/dL (ref 12.0–15.0)
Immature Granulocytes: 0 %
Lymphocytes Relative: 30 %
Lymphs Abs: 1.7 10*3/uL (ref 0.7–4.0)
MCH: 29.5 pg (ref 26.0–34.0)
MCHC: 33.6 g/dL (ref 30.0–36.0)
MCV: 87.9 fL (ref 80.0–100.0)
Monocytes Absolute: 0.5 10*3/uL (ref 0.1–1.0)
Monocytes Relative: 9 %
Neutro Abs: 3.4 10*3/uL (ref 1.7–7.7)
Neutrophils Relative %: 59 %
Platelet Count: 270 10*3/uL (ref 150–400)
RBC: 4.71 MIL/uL (ref 3.87–5.11)
RDW: 13.9 % (ref 11.5–15.5)
WBC Count: 5.6 10*3/uL (ref 4.0–10.5)
nRBC: 0 % (ref 0.0–0.2)

## 2022-03-19 LAB — CMP (CANCER CENTER ONLY)
ALT: 14 U/L (ref 0–44)
AST: 13 U/L — ABNORMAL LOW (ref 15–41)
Albumin: 4.2 g/dL (ref 3.5–5.0)
Alkaline Phosphatase: 72 U/L (ref 38–126)
Anion gap: 5 (ref 5–15)
BUN: 18 mg/dL (ref 8–23)
CO2: 29 mmol/L (ref 22–32)
Calcium: 9.4 mg/dL (ref 8.9–10.3)
Chloride: 106 mmol/L (ref 98–111)
Creatinine: 0.96 mg/dL (ref 0.44–1.00)
GFR, Estimated: >60 mL/min (ref >60)
Glucose, Bld: 103 mg/dL — ABNORMAL HIGH (ref 70–99)
Potassium: 4.6 mmol/L (ref 3.5–5.1)
Sodium: 140 mmol/L (ref 135–145)
Total Bilirubin: 0.4 mg/dL (ref 0.3–1.2)
Total Protein: 7 g/dL (ref 6.5–8.1)

## 2022-03-19 NOTE — Progress Notes (Signed)
?[facility]   ?[phone] [phone]   ?Clinic Follow up Note  ? ?Patient Care Team: ?Eulas Post, MD as PCP - General (Family Medicine) ?Barbaraann Cao, OD as Referring Physician (Optometry) ?Jovita Kussmaul, MD as Consulting Physician (General Surgery) ?Mauro Kaufmann, RN as Oncology Nurse Navigator ?Rockwell Germany, RN as Oncology Nurse Navigator ?Truitt Merle, MD as Consulting Physician (Hematology) ?Alla Feeling, NP as Nurse Practitioner (Nurse Practitioner) ?Eppie Gibson, MD as Attending Physician (Radiation Oncology) ? ?Date of Service:  03/19/2022 ? ?CHIEF COMPLAINT: f/u of left breast cancer ? ?CURRENT THERAPY:  ?Surveillance ? ?ASSESSMENT & PLAN:  ?Shelly [name] is a 76 y.o. post-menopausal female with  ? ?1. Cancer of central portion of left female breast, Stage IA, c(T1bN0M0), ER/PR+, HER2-, Grade II, pT1cN1aM0, stage IA  ?-diagnosed in 08/2019, s/p B/l breast lumpectomy and SLNB with Dr. Marlou Starks on 10/06/19. Path showed: 1.8 cm IDC and DCIS, anterior margin focally positive; one positive lymph node with extranodal extension (1/3). Re-excision on 11/06/19 showed DCIS only, margins negative.  ?-Mammaprint showed high risk. She did not tolerate TC (allergic reaction to taxol) or Abraxane (symptomatic tachycardia, GI symptoms). ?-She completed adjuvant RT with Dr Isidore Moos 01/25/20 - 03/06/20.  ?-She did not want to pursue Genetic testing ?-She took antiestrogen therapy over 2 months 05/2020-07/2020 and stopped due to poor toleration/side effects (exacerbated IBS from tamoxifen; bone concerns and anxiety from exemestane, declined to restart with SSRI). ?-most recent mammogram from 08/21/21 was benign. ?-From a breast cancer standpoint, she is clinically doing well. Labs reviewed, overall WNL. Physical exam benign. ?-Continue surveillance. F/u in 6 months  ? ?2. Multifocal infiltrates in bilateral lungs, ?  Organizing pneumonia ?-chest CT on 09/03/20 showed 2.2  cm area of mixed density in the left upper lobe with surrounding area of groundglass changes.  ?-s/p bronchoscopy on 12/27/20 by Dr. Valeta Harms. Cytology (from brush, lavage and FNA of LLL and station 7 node) was negative for malignancy, except LLL FNA showed atypical cells.  ?-Chest CT 04/16/21 showed: waxing and waning appearance of multifocal airspace disease, with complete resolution of previous abnormalities and new consolidation within right upper and lower lobes, overall suggesting underlying organizing pneumonia with indeterminate etiology. ?-She will have repeat CT on 04/22/22  ?  ?3. Osteoporosis, Vitamin D deficiency ?-Diagnosed prior to 2007 in [place]. Last DEXA in 06/2015 (-3.2 at [facility]).  ?-she completed 5 years of Prolia in 01/2021. ?-repeat DEXA 08/22/21 showed improvement to osteopenia, T-score -1.5 ?-Patient resumed Prolia injections with her PCP in October 2022.  ?-Continue OTC Vit D and Tums (for calcium) ?  ?4. IBS, Diarrhea and Acid Reflux ?-history of IBS, noticed intermittent episodes of diarrhea after taking calcium supplements.  ?-Her BM are controlled on Librax and Nexium and imodium. ?-Most recent colonoscopy on 05/20/21 showed multiple colonic polyps that were removed, non-bleeding internal hemorrhoids and diverticulosis. Pathology reveals no evidence of high grade dysphagia or carcinoma.  ?-Continue to monitor.  ?  ?  ?PLAN:  ?-continue surveillance ?-f/u with Dr. Marlou Starks 04/02/22 ?-chest CT 04/22/22 scheduled  ?-mammogram due 08/2022, I ordered today ?-lab and f/u in 6 months ? ? ?No problem-specific Assessment & Plan notes found for this encounter. ? ? ?SUMMARY OF ONCOLOGIC HISTORY: ?Oncology History Overview Note  ?Cancer Staging ?Cancer of central portion of left female breast ([place]) ?Staging form: Breast, AJCC 8th Edition ?- Clinical stage from 09/18/2019: Stage IA (cT1b, cN0, cM0, G2, ER+, PR+, HER2-) - Signed  by Truitt Merle, MD on 09/18/2019 ?- Pathologic stage from 10/06/2019: Stage IA (pT1c, pN1a,  cM0, G2, ER+, PR+, HER2-) - Signed by Truitt Merle, MD on 10/19/2019 ? ?  ?Cancer of central portion of left female breast ([place])  ?08/24/2019 Mammogram  ? Diagnostic mammogram 08/24/19  ?IMPRESSION: ?1. Left breast mass measuring 0.8 x 0.4 x 0.5 cm at the 1 o'clock retroareolar ?region is suspicious and likely corresponds to the area of ?distortion seen on mammography. ?  ?2. Left breast 3 o'clock retroareolar dilated duct with internal ?debris versus solid mass. ?  ?3. Right breast retroareolar focal asymmetry without sonographic ?correlate. ?  ?08/28/2019 Initial Biopsy  ? Final Diagnosis 08/28/19 ?1. Breast, left, needle core biopsy, 1 o'clock/retroareolar ?- INVASIVE DUCTAL CARCINOMA, GRADE II. ?- PERINEURAL INVASION. ?- SEE MICROSCOPIC DESCRIPTION. ?2. Breast, left, needle core biopsy, 3 o'clock retroareolar ?- DUCTAL PAPILLOMA. ?- SEE MICROSCOPIC DESCRIPTION. ?  ?08/28/2019 Receptors her2  ? 1. PROGNOSTIC INDICATORS 08/28/19 ?Results: ?IMMUNOHISTOCHEMICAL AND MORPHOMETRIC ANALYSIS PERFORMED MANUALLY ?The tumor cells are NEGATIVE for Her2 (0). ?Estrogen Receptor: 100%, POSITIVE, STRONG STAINING INTENSITY ?Progesterone Receptor: 50%, POSITIVE, STRONG STAINING INTENSITY ?Proliferation Marker Ki67: 12% ?  ?09/01/2019 Pathology Results  ? Diagnosis 09/01/19 ?Breast, right, needle core biopsy, retroareolar ?- COMPLEX SCLEROSING LESION WITH A PAPILLARY SCLEROSING LESION AND USUAL DUCTAL HYPERPLASIA. SEE ?NOTE ?- NEGATIVE FOR CARCINOMA ?  ?09/18/2019 Initial Diagnosis  ? Cancer of central portion of left female breast ([place]) ? ?  ?09/18/2019 Cancer Staging  ? Staging form: Breast, AJCC 8th Edition ?- Clinical stage from 09/18/2019: Stage IA (cT1b, cN0, cM0, G2, ER+, PR+, HER2-) - Signed by Truitt Merle, MD on 09/18/2019 ? ?  ?10/06/2019 Surgery  ? LEFT BREAST LUMPECTOMY  X2 WITH RADIOACTIVE SEED X2  AND SENTINEL LYMPH NODE BIOPSY and  ?RIGHT BREAST LUMPECTOMY WITH RADIOACTIVE SEED LOCALIZATION by Dr Marlou Starks  ?10/06/19  ?  ?10/06/2019  Pathology Results  ? FINAL MICROSCOPIC DIAGNOSIS: 10/06/19  ? ?A. BREAST, RIGHT, LUMPECTOMY:  ?- Fibrocystic change with a small incidental intraductal papilloma and  ?usual ductal hyperplasia  ?- Biopsy site changes  ?- Negative for carcinoma  ? ?B. LYMPH NODE, LEFT #1, SENTINEL, BIOPSY:  ?- Metastatic carcinoma to a lymph node (1/1)  ?- Focus of metastatic carcinoma measures 2.5 mm and shows extranodal  ?extension  ? ?C. LYMPH NODE, LEFT #2, SENTINEL, BIOPSY:  ?- Lymph node, negative for carcinoma (0/1)  ? ?D. LYMPH NODE, LEFT #3, SENTINEL, BIOPSY:  ?- Lymph node, negative for carcinoma (0/1)  ? ?E. BREAST, LEFT, LUMPECTOMY:  ?- Invasive ductal carcinoma, grade 2, 1.8 cm  ?- Ductal carcinoma in situ, intermediate grade  ?- Invasive carcinoma focally involves the anterior margin  ?- DCIS is less than 1 mm from the superior margin  ?- Background breast parenchyma with fibrocystic change, usual ductal  ?hyperplasia and intraductal papilloma.  ?- Biopsy site changes  ?- See oncology table  ?  ?10/06/2019 Miscellaneous  ? Her Mammaprint showed High Risk Luminal Type B, risk of recurrence in the next 10 years is 29% in high risk disease. MPI -0.335  ?  ?10/06/2019 Cancer Staging  ? Staging form: Breast, AJCC 8th Edition ?- Pathologic stage from 10/06/2019: Stage IA (pT1c, pN1a, cM0, G2, ER+, PR+, HER2-) - Signed by Truitt Merle, MD on 10/19/2019 ? ?  ?11/06/2019 Surgery  ? EXCISION NIPPLE AND AREOLA  MARGIN LEFT BREAST by Dr Marlou Starks  ?11/06/19  ?  ?11/06/2019 Pathology Results  ?  ?FINAL MICROSCOPIC DIAGNOSIS: 11/06/19 ? ?  A. BREAST, LEFT CENTRAL, RE-EXCISION:  ?-  Residual ductal carcinoma in situ, intermediate grade  ?-  Margins uninvolved by carcinoma (0.4 cm; superior margin)  ?-  Atypical intraductal papilloma  ?-  Usual ductal hyperplasia  ?-  Benign skin with seborrheic keratosis  ?-  Previous surgical site changes  ?-  No invasive carcinoma identified  ?  ?12/06/2019 - 12/20/2019 Chemotherapy  ? Adjuvant chemo TC q3weeks  for 4 cycles starting 12/06/19. With first TC infusion she had allergy reaction to docetaxel and [name] was not given on 12/06/19, we discontinued.  ? ?-I started her on weekly Abraxane on 12/20/19, but 2 day

## 2022-03-20 ENCOUNTER — Other Ambulatory Visit: Payer: Self-pay | Admitting: Hematology

## 2022-03-20 DIAGNOSIS — Z9889 Other specified postprocedural states: Secondary | ICD-10-CM

## 2022-03-21 ENCOUNTER — Encounter: Payer: Self-pay | Admitting: Hematology

## 2022-03-25 ENCOUNTER — Telehealth: Payer: Self-pay | Admitting: Hematology

## 2022-03-25 NOTE — Telephone Encounter (Signed)
Left message with follow-up appointment per 5/4 los. ?

## 2022-04-02 DIAGNOSIS — C50112 Malignant neoplasm of central portion of left female breast: Secondary | ICD-10-CM | POA: Diagnosis not present

## 2022-04-02 DIAGNOSIS — Z170 Estrogen receptor positive status [ER+]: Secondary | ICD-10-CM | POA: Diagnosis not present

## 2022-04-10 ENCOUNTER — Encounter: Payer: Self-pay | Admitting: Nurse Practitioner

## 2022-04-10 ENCOUNTER — Ambulatory Visit (INDEPENDENT_AMBULATORY_CARE_PROVIDER_SITE_OTHER): Payer: Medicare Other | Admitting: Nurse Practitioner

## 2022-04-10 VITALS — BP 136/60 | HR 76 | Temp 97.0°F | Wt 180.2 lb

## 2022-04-10 DIAGNOSIS — M545 Low back pain, unspecified: Secondary | ICD-10-CM | POA: Diagnosis not present

## 2022-04-10 DIAGNOSIS — R350 Frequency of micturition: Secondary | ICD-10-CM

## 2022-04-10 LAB — POCT URINALYSIS DIPSTICK
Bilirubin, UA: NEGATIVE
Blood, UA: NEGATIVE
Glucose, UA: NEGATIVE
Ketones, UA: NEGATIVE
Leukocytes, UA: NEGATIVE
Nitrite, UA: NEGATIVE
Protein, UA: NEGATIVE
Spec Grav, UA: 1.02 (ref 1.010–1.025)
Urobilinogen, UA: 0.2 E.U./dL
pH, UA: 5.5 (ref 5.0–8.0)

## 2022-04-10 NOTE — Progress Notes (Unsigned)
Acute Office Visit  Subjective:     Patient ID: Shelly [name], female    DOB: April 28, 1946, 76 y.o.   MRN: [id number]  Chief Complaint  Patient presents with   Back Pain    Lower back pain with frequent, but little urination that comes and goes X 4 days. Denies painful urination.     HPI Patient is in today for low back pain with frequent urination but small amounts. She states that she commonly urinates small amounts frequently, but the low back pain is new. She also states that she slept sitting up the other night due to reflux and is not sure if she slept funny. She describes the pain as an aching and is on both sides. Right now, she states that it is feeling slightly better. She wants to make sure she doesn't have a kidney infection. She denies fevers, dysuria, abdominal pain, nausea, and vomiting.   ROS See pertinent positives and negatives per HPI.     Objective:    BP 136/60 (BP Location: Right Arm, Cuff Size: Large)   Pulse 76   Temp (!) 97 F (36.1 C) (Temporal)   Wt 180 lb 3.2 oz (81.7 kg)   SpO2 97%   BMI 29.99 kg/m    Physical Exam Vitals and nursing note reviewed.  Constitutional:      General: She is not in acute distress.    Appearance: Normal appearance.  HENT:     Head: Normocephalic.  Eyes:     Conjunctiva/sclera: Conjunctivae normal.  Cardiovascular:     Rate and Rhythm: Normal rate and regular rhythm.     Pulses: Normal pulses.     Heart sounds: Normal heart sounds.  Pulmonary:     Effort: Pulmonary effort is normal.     Breath sounds: Normal breath sounds.  Abdominal:     Palpations: Abdomen is soft.     Tenderness: There is no abdominal tenderness. There is no right CVA tenderness or left CVA tenderness.  Musculoskeletal:        General: No tenderness. Normal range of motion.     Cervical back: Normal range of motion.  Skin:    General: Skin is warm.  Neurological:     General: No focal deficit present.     Mental Status: She is  alert and oriented to person, place, and time.  Psychiatric:        Mood and Affect: Mood normal.        Behavior: Behavior normal.        Thought Content: Thought content normal.        Judgment: Judgment normal.    Results for orders placed or performed in visit on 04/10/22  Urine Culture   Specimen: Urine  Result Value Ref Range   MICRO NUMBER: [id number]    SPECIMEN QUALITY: Adequate    Sample Source URINE    STATUS: FINAL    Result:      Mixed genital flora isolated. These superficial bacteria are not indicative of a urinary tract infection. No further organism identification is warranted on this specimen. If clinically indicated, recollect clean-catch, mid-stream urine and transfer  immediately to Urine Culture Transport Tube.   POCT urinalysis dipstick  Result Value Ref Range   Color, UA YELLO    Clarity, UA CLEA    Glucose, UA Negative Negative   Bilirubin, UA NEG    Ketones, UA NEG    Spec Grav, UA 1.020 1.010 - 1.025   Blood,  UA NEG    pH, UA 5.5 5.0 - 8.0   Protein, UA Negative Negative   Urobilinogen, UA 0.2 0.2 or 1.0 E.U./dL   Nitrite, UA NEG    Leukocytes, UA Negative Negative   Appearance     Odor        Assessment & Plan:   Problem List Items Addressed This Visit       Other   Urinary frequency - Primary    U/A in office negative. Will check urine for culture to verify no infection. Discussed limiting caffeine and doing kegel exercises to help strengthen pelvic floor.        Relevant Orders   POCT urinalysis dipstick (Completed)   Urine Culture (Completed)   Acute bilateral low back pain without sciatica    No tenderness with palpation, no red flags on exam. Could be musculoskeletal. Recent BMP from 2 weeks ago WNL. U/A negative in office. She can take tylenol and use heat as needed for pain. Follow-up if symptoms worsen or don't improve.         No orders of the defined types were placed in this encounter.   Return if symptoms worsen or  fail to improve.  Charyl Dancer, NP

## 2022-04-10 NOTE — Patient Instructions (Addendum)
It was great to see you!  Make sure you are drinking plenty of water during the day. You can use tylenol as needed for pain along with heat.   Start kegel exercises several times a day  Let's follow-up if your symptoms worsen or any concerns.   Take care,  Vance Peper, NP

## 2022-04-11 LAB — URINE CULTURE
MICRO NUMBER:: 13450850
SPECIMEN QUALITY:: ADEQUATE

## 2022-04-12 ENCOUNTER — Other Ambulatory Visit: Payer: Self-pay | Admitting: Family Medicine

## 2022-04-12 DIAGNOSIS — M545 Low back pain, unspecified: Secondary | ICD-10-CM | POA: Insufficient documentation

## 2022-04-12 DIAGNOSIS — R350 Frequency of micturition: Secondary | ICD-10-CM | POA: Insufficient documentation

## 2022-04-12 NOTE — Assessment & Plan Note (Signed)
No tenderness with palpation, no red flags on exam. Could be musculoskeletal. Recent BMP from 2 weeks ago WNL. U/A negative in office. She can take tylenol and use heat as needed for pain. Follow-up if symptoms worsen or don't improve.

## 2022-04-12 NOTE — Assessment & Plan Note (Signed)
U/A in office negative. Will check urine for culture to verify no infection. Discussed limiting caffeine and doing kegel exercises to help strengthen pelvic floor.

## 2022-04-17 ENCOUNTER — Telehealth: Payer: Self-pay | Admitting: Family Medicine

## 2022-04-17 NOTE — Telephone Encounter (Signed)
I spoke with the patient and she reported lower back pain, x2 weeks, Patient was seen at another office on 04/10/2022 in regards to this. Pt inquired if this could be a side effect from Losartan

## 2022-04-17 NOTE — Telephone Encounter (Signed)
Pt informed of the message and verbalized understanding  

## 2022-04-17 NOTE — Telephone Encounter (Signed)
Pt feels her kidneys are bothering her, wonders if it's from the losartan (COZAAR) 100 MG tablet

## 2022-04-17 NOTE — Telephone Encounter (Signed)
Left a message for the pt to return my call.  

## 2022-04-20 ENCOUNTER — Telehealth: Payer: Self-pay | Admitting: Family Medicine

## 2022-04-20 NOTE — Telephone Encounter (Signed)
Pt has been scheduled.  °

## 2022-04-20 NOTE — Telephone Encounter (Signed)
Pt is having side effects (back pain in kidney area) from losartan (COZAAR) 100 MG tablet and wonders if there is another option

## 2022-04-21 ENCOUNTER — Encounter: Payer: Self-pay | Admitting: Family Medicine

## 2022-04-21 ENCOUNTER — Ambulatory Visit (INDEPENDENT_AMBULATORY_CARE_PROVIDER_SITE_OTHER): Payer: Medicare Other | Admitting: Family Medicine

## 2022-04-21 VITALS — BP 166/76 | HR 85 | Temp 97.7°F | Ht 65.0 in | Wt 179.7 lb

## 2022-04-21 DIAGNOSIS — I10 Essential (primary) hypertension: Secondary | ICD-10-CM | POA: Diagnosis not present

## 2022-04-21 MED ORDER — LISINOPRIL 10 MG PO TABS: 10.0000 mg | ORAL_TABLET | Freq: Every day | ORAL | 3 refills | Status: DC

## 2022-04-21 NOTE — Progress Notes (Signed)
Established Patient Office Visit  Subjective   Patient ID: Shelly [name], female    DOB: 20-Jul-1946  Age: 76 y.o. MRN: [id number]  Chief Complaint  Patient presents with   Medication Reaction    HPI   Here for follow-up regarding hypertension.  She has had predominantly isolated systolic hypertension.  Few months ago we started amlodipine.  She took this for just a few days and was concerned this was aggravating her IBS symptoms.  We then switched to losartan.  She took this for a full month and her blood pressure was improved but she was having musculoskeletal pain predominantly bilateral low back pain.  She states after stopping the losartan within 2 days her back pain improved.  She recently went to another of our primary care sites a little over a week ago-and blood pressure was controlled at that time but she was taking losartan then.  UTI was ruled out.  She has scaled back her alcohol use somewhat.  Blood pressure back up today.  She has multiple drug allergies including sulfa.  Past Medical History:  Diagnosis Date   Allergy    Arthritis    wrist, shoulder    Cancer ([place]) 08/2019   left breast IDC   Diverticulitis    Endometrial polyp    Family history of breast cancer    GERD (gastroesophageal reflux disease)    occasional - diet controlled   Hx of adenomatous colonic polyps    Hyperlipidemia    IBS (irritable bowel syndrome)    Missed abortion    x 1 - resolved - no surgery   Osteoporosis    SVD (spontaneous vaginal delivery)    x 1   Transient vision disturbance 2019   was evaluated by ED, no findings   Vitamin D deficiency    Past Surgical History:  Procedure Laterality Date   BREAST EXCISIONAL BIOPSY Right 08/2019   Benign, lesion    BREAST LUMPECTOMY Left 09/2019   BREAST LUMPECTOMY WITH RADIOACTIVE SEED AND SENTINEL LYMPH NODE BIOPSY Left 10/06/2019   Procedure: LEFT BREAST LUMPECTOMY  X2 WITH RADIOACTIVE SEED X2  AND SENTINEL LYMPH NODE BIOPSY;   Surgeon: Jovita Kussmaul, MD;  Location: [place];  Service: General;  Laterality: Left;   BREAST LUMPECTOMY WITH RADIOACTIVE SEED LOCALIZATION Right 10/06/2019   Procedure: RIGHT BREAST LUMPECTOMY WITH RADIOACTIVE SEED LOCALIZATION;  Surgeon: Jovita Kussmaul, MD;  Location: [place];  Service: General;  Laterality: Right;   BRONCHIAL BIOPSY  12/27/2020   Procedure: BRONCHIAL BIOPSIES;  Surgeon: Garner Nash, DO;  Location: [facility];  Service: Pulmonary;;   BRONCHIAL BRUSHINGS  12/27/2020   Procedure: BRONCHIAL BRUSHINGS;  Surgeon: Garner Nash, DO;  Location: [facility];  Service: Pulmonary;;   BRONCHIAL NEEDLE ASPIRATION BIOPSY  12/27/2020   Procedure: BRONCHIAL NEEDLE ASPIRATION BIOPSIES;  Surgeon: Garner Nash, DO;  Location: [facility];  Service: Pulmonary;;   BRONCHIAL WASHINGS  12/27/2020   Procedure: BRONCHIAL WASHINGS;  Surgeon: Garner Nash, DO;  Location: [facility];  Service: Pulmonary;;   COLONOSCOPY     DIAGNOSTIC LAPAROSCOPY     cysts   DILATATION & CURETTAGE/HYSTEROSCOPY WITH TRUECLEAR N/A 10/31/2013   Procedure: DILATATION & CURETTAGE/HYSTEROSCOPY WITH TRUCLEAR;  Surgeon: Marylynn Pearson, MD;  Location: [facility];  Service: Gynecology;  Laterality: N/A;   DILATION AND CURETTAGE OF UTERUS     MASS EXCISION Left 11/06/2019   Procedure: EXCISION NIPPLE AND AREOLA  MARGIN LEFT BREAST;  Surgeon: Jovita Kussmaul, MD;  Location: [facility];  Service: General;  Laterality: Left;   [place] Bilateral 12/27/2020   Procedure: VIDEO BRONCHOSCOPY WITH ENDOBRONCHIAL NAVIGATION;  Surgeon: Garner Nash, DO;  Location: [facility];  Service: Pulmonary;  Laterality: Bilateral;   VIDEO BRONCHOSCOPY WITH ENDOBRONCHIAL ULTRASOUND Bilateral 12/27/2020   Procedure: VIDEO BRONCHOSCOPY WITH ENDOBRONCHIAL ULTRASOUND;  Surgeon: Garner Nash, DO;  Location: [facility];   Service: Pulmonary;  Laterality: Bilateral;   WISDOM TOOTH EXTRACTION      reports that she quit smoking about 14 years ago. Her smoking use included cigarettes. She has a 2.00 pack-year smoking history. She has never used smokeless tobacco. She reports current alcohol use of about 14.0 standard drinks per week. She reports that she does not use drugs. family history includes Brain cancer in her paternal uncle; Breast cancer (age of onset: 16) in her cousin; Breast cancer (age of onset: 37) in her maternal aunt and maternal aunt; Cancer in her maternal grandfather, maternal uncle, and another family member; Colon polyps in her mother; Depression in her brother; Diabetes in her mother; Heart disease in an other family member; Hyperlipidemia in her father and mother; Hypertension in her mother and another family member; Leukemia (age of onset: 96) in her mother; Lung cancer in her father and maternal grandfather; Throat cancer in her paternal uncle; Uterine cancer in her paternal grandmother. Allergies  Allergen Reactions   Taxotere [Docetaxel] Swelling   Abraxane [Paclitaxel Protein-Bound Part] Other (See Comments)    3 days after receiving she had one episode of very high BP and HR. This happened again 2 days later during the night.    Crestor [Rosuvastatin Calcium] Itching    GI upset   Lipitor [Atorvastatin Calcium] Itching   Nsaids Other (See Comments)    GI upset   Pravastatin Itching    GI upset   Sulfonamide Derivatives Rash    Hives, itiching    Review of Systems  Constitutional:  Negative for chills and fever.  Cardiovascular:  Negative for chest pain.  Genitourinary:  Negative for dysuria.  Musculoskeletal:  Negative for back pain.  Neurological:  Negative for dizziness and headaches.     Objective:     BP (!) 166/76 (BP Location: Left Arm, Patient Position: Sitting, Cuff Size: Normal)   Pulse 85   Temp 97.7 F (36.5 C) (Oral)   Ht '5\' 5"'$  (1.651 m)   Wt 179 lb 11.2 oz  (81.5 kg)   SpO2 96%   BMI 29.90 kg/m  BP Readings from Last 3 Encounters:  04/21/22 (!) 166/76  04/10/22 136/60  03/19/22 (!) 163/75   Wt Readings from Last 3 Encounters:  04/21/22 179 lb 11.2 oz (81.5 kg)  04/10/22 180 lb 3.2 oz (81.7 kg)  03/19/22 180 lb (81.6 kg)      Physical Exam Vitals reviewed.  Constitutional:      Appearance: Normal appearance.  Cardiovascular:     Rate and Rhythm: Normal rate and regular rhythm.  Pulmonary:     Effort: Pulmonary effort is normal.     Breath sounds: Normal breath sounds.  Musculoskeletal:     Right lower leg: No edema.     Left lower leg: No edema.  Neurological:     Mental Status: She is alert.     No results found for any visits on 04/21/22.  Last metabolic panel Lab Results  Component Value Date  GLUCOSE 103 (H) 03/19/2022   NA 140 03/19/2022   K 4.6 03/19/2022   CL 106 03/19/2022   CO2 29 03/19/2022   BUN 18 03/19/2022   CREATININE 0.96 03/19/2022   GFRNONAA >60 03/19/2022   CALCIUM 9.4 03/19/2022   PROT 7.0 03/19/2022   ALBUMIN 4.2 03/19/2022   BILITOT 0.4 03/19/2022   ALKPHOS 72 03/19/2022   AST 13 (L) 03/19/2022   ALT 14 03/19/2022   ANIONGAP 5 03/19/2022      The ASCVD Risk score (Arnett DK, et al., 2019) failed to calculate for the following reasons:   Cannot find a previous HDL lab   Cannot find a previous total cholesterol lab    Assessment & Plan:   Problem List Items Addressed This Visit       Unprioritized   Essential hypertension - Primary   Relevant Medications   lisinopril (ZESTRIL) 10 MG tablet  Patient has hypertension-isolated systolic hypertension and currently off blood pressure medications.  She initially was on amlodipine but had questionable flare of IBS symptoms and then was placed on losartan and had musculoskeletal complaints.  We explained these are not common with angiotensin receptor blockers but are possible.  -We decided to go ahead and start lisinopril 10 mg daily.   Try to avoid thiazides with her history of sulfa allergy. -Encouraged continued weight loss. -Set up 3-week follow-up.  Plan basic metabolic panel then  Return in about 3 weeks (around 05/12/2022).    Carolann Littler, MD

## 2022-04-21 NOTE — Patient Instructions (Signed)
Stop the Losartan and start Lisinopril 10 mg daily  Set up 3 week follow up.

## 2022-04-22 ENCOUNTER — Ambulatory Visit (HOSPITAL_COMMUNITY)
Admission: RE | Admit: 2022-04-22 | Discharge: 2022-04-22 | Disposition: A | Discharge status: Home / Self Care | Payer: Medicare Other | Source: Ambulatory Visit | Attending: Pulmonary Disease | Admitting: Pulmonary Disease

## 2022-04-22 DIAGNOSIS — K449 Diaphragmatic hernia without obstruction or gangrene: Secondary | ICD-10-CM | POA: Diagnosis not present

## 2022-04-22 DIAGNOSIS — Z8701 Personal history of pneumonia (recurrent): Secondary | ICD-10-CM | POA: Diagnosis not present

## 2022-04-22 DIAGNOSIS — Z09 Encounter for follow-up examination after completed treatment for conditions other than malignant neoplasm: Secondary | ICD-10-CM | POA: Diagnosis not present

## 2022-04-22 DIAGNOSIS — I700 Atherosclerosis of aorta: Secondary | ICD-10-CM | POA: Insufficient documentation

## 2022-04-22 DIAGNOSIS — I2510 Atherosclerotic heart disease of native coronary artery without angina pectoris: Secondary | ICD-10-CM | POA: Insufficient documentation

## 2022-04-22 DIAGNOSIS — R918 Other nonspecific abnormal finding of lung field: Secondary | ICD-10-CM | POA: Insufficient documentation

## 2022-04-28 NOTE — Progress Notes (Signed)
Ty, please let patient know that the nodule we were following has resolved. No additional follow up needed at this time.   Thanks,  BLI  Garner Nash, DO [facility] 04/28/2022 2:11 PM

## 2022-05-22 DIAGNOSIS — H04123 Dry eye syndrome of bilateral lacrimal glands: Secondary | ICD-10-CM | POA: Diagnosis not present

## 2022-05-22 DIAGNOSIS — H524 Presbyopia: Secondary | ICD-10-CM | POA: Diagnosis not present

## 2022-05-22 DIAGNOSIS — Z961 Presence of intraocular lens: Secondary | ICD-10-CM | POA: Diagnosis not present

## 2022-07-01 DIAGNOSIS — L820 Inflamed seborrheic keratosis: Secondary | ICD-10-CM | POA: Diagnosis not present

## 2022-07-01 DIAGNOSIS — D225 Melanocytic nevi of trunk: Secondary | ICD-10-CM | POA: Diagnosis not present

## 2022-07-17 ENCOUNTER — Encounter: Payer: Self-pay | Admitting: Family Medicine

## 2022-07-17 ENCOUNTER — Ambulatory Visit (INDEPENDENT_AMBULATORY_CARE_PROVIDER_SITE_OTHER): Payer: Medicare Other | Admitting: Family Medicine

## 2022-07-17 VITALS — BP 154/70 | HR 75 | Temp 98.1°F | Ht 65.0 in | Wt 181.9 lb

## 2022-07-17 DIAGNOSIS — I10 Essential (primary) hypertension: Secondary | ICD-10-CM | POA: Diagnosis not present

## 2022-07-17 DIAGNOSIS — R053 Chronic cough: Secondary | ICD-10-CM | POA: Diagnosis not present

## 2022-07-17 MED ORDER — DILTIAZEM HCL ER COATED BEADS 120 MG PO CP24: 120.0000 mg | ORAL_CAPSULE | Freq: Every day | ORAL | 5 refills | Status: DC

## 2022-07-17 NOTE — Progress Notes (Signed)
Ms. Helzer  Established Patient Office Visit  Subjective   Patient ID: Shelly [name], female    DOB: 1946-02-11  Age: 76 y.o. MRN: [id number]  Chief Complaint  Patient presents with   Cough    Patient complains of cough, x3 months,     HPI   Ms. Gonser is seen for persistent cough past few months.  She has hypertension and has had intolerance with multiple medications.  We have tried her on amlodipine several months ago but she had concerned that she was having problems with IBS exacerbation of symptoms.  We then tried losartan and she states she had consistent joint pains which promptly went away after she stopped the medication.  She more recently was started on lisinopril which has helped her blood pressure but unfortunately she has developed dry cough.  She has history of sulfa allergy and is not a great candidate for thiazides.  She knows she needs to lose some weight.  Currently not exercising regularly.  Cough is dry.  No hemoptysis.  No appetite or weight changes.  No GERD symptoms.  Occasional postnasal drip.  No obvious wheezing.  She had CT chest high-resolution back in June which showed improvement in airspace opacity right upper lobe suggesting previous infection/inflammation resolving.  Past Medical History:  Diagnosis Date   Allergy    Arthritis    wrist, shoulder    Cancer ([place]) 08/2019   left breast IDC   Diverticulitis    Endometrial polyp    Family history of breast cancer    GERD (gastroesophageal reflux disease)    occasional - diet controlled   Hx of adenomatous colonic polyps    Hyperlipidemia    IBS (irritable bowel syndrome)    Missed abortion    x 1 - resolved - no surgery   Osteoporosis    SVD (spontaneous vaginal delivery)    x 1   Transient vision disturbance 2019   was evaluated by ED, no findings   Vitamin D deficiency    Past Surgical History:  Procedure Laterality Date   BREAST EXCISIONAL BIOPSY Right 08/2019   Benign, lesion     BREAST LUMPECTOMY Left 09/2019   BREAST LUMPECTOMY WITH RADIOACTIVE SEED AND SENTINEL LYMPH NODE BIOPSY Left 10/06/2019   Procedure: LEFT BREAST LUMPECTOMY  X2 WITH RADIOACTIVE SEED X2  AND SENTINEL LYMPH NODE BIOPSY;  Surgeon: Jovita Kussmaul, MD;  Location: [facility];  Service: General;  Laterality: Left;   BREAST LUMPECTOMY WITH RADIOACTIVE SEED LOCALIZATION Right 10/06/2019   Procedure: RIGHT BREAST LUMPECTOMY WITH RADIOACTIVE SEED LOCALIZATION;  Surgeon: Jovita Kussmaul, MD;  Location: [facility];  Service: General;  Laterality: Right;   BRONCHIAL BIOPSY  12/27/2020   Procedure: BRONCHIAL BIOPSIES;  Surgeon: Garner Nash, DO;  Location: [facility];  Service: Pulmonary;;   BRONCHIAL BRUSHINGS  12/27/2020   Procedure: BRONCHIAL BRUSHINGS;  Surgeon: Garner Nash, DO;  Location: [facility];  Service: Pulmonary;;   BRONCHIAL NEEDLE ASPIRATION BIOPSY  12/27/2020   Procedure: BRONCHIAL NEEDLE ASPIRATION BIOPSIES;  Surgeon: Garner Nash, DO;  Location: [facility];  Service: Pulmonary;;   BRONCHIAL WASHINGS  12/27/2020   Procedure: BRONCHIAL WASHINGS;  Surgeon: Garner Nash, DO;  Location: [facility];  Service: Pulmonary;;   COLONOSCOPY     DIAGNOSTIC LAPAROSCOPY     cysts   DILATATION & CURETTAGE/HYSTEROSCOPY WITH TRUECLEAR N/A 10/31/2013   Procedure: DILATATION & CURETTAGE/HYSTEROSCOPY WITH TRUCLEAR;  Surgeon: Marylynn Pearson, MD;  Location:  [facility];  Service: Gynecology;  Laterality: N/A;   DILATION AND CURETTAGE OF UTERUS     MASS EXCISION Left 11/06/2019   Procedure: EXCISION NIPPLE AND AREOLA  MARGIN LEFT BREAST;  Surgeon: Jovita Kussmaul, MD;  Location: [facility];  Service: General;  Laterality: Left;   TONSILLECTOMY  1952   VIDEO BRONCHOSCOPY WITH ENDOBRONCHIAL NAVIGATION Bilateral 12/27/2020   Procedure: VIDEO BRONCHOSCOPY WITH ENDOBRONCHIAL NAVIGATION;  Surgeon: Garner Nash, DO;  Location: [facility];  Service:  Pulmonary;  Laterality: Bilateral;   VIDEO BRONCHOSCOPY WITH ENDOBRONCHIAL ULTRASOUND Bilateral 12/27/2020   Procedure: VIDEO BRONCHOSCOPY WITH ENDOBRONCHIAL ULTRASOUND;  Surgeon: Garner Nash, DO;  Location: [facility];  Service: Pulmonary;  Laterality: Bilateral;   WISDOM TOOTH EXTRACTION      reports that she quit smoking about 15 years ago. Her smoking use included cigarettes. She has a 2.00 pack-year smoking history. She has never used smokeless tobacco. She reports current alcohol use of about 14.0 standard drinks of alcohol per week. She reports that she does not use drugs. family history includes Brain cancer in her paternal uncle; Breast cancer (age of onset: 65) in her cousin; Breast cancer (age of onset: 28) in her maternal aunt and maternal aunt; Cancer in her maternal grandfather, maternal uncle, and another family member; Colon polyps in her mother; Depression in her brother; Diabetes in her mother; Heart disease in an other family member; Hyperlipidemia in her father and mother; Hypertension in her mother and another family member; Leukemia (age of onset: 64) in her mother; Lung cancer in her father and maternal grandfather; Throat cancer in her paternal uncle; Uterine cancer in her paternal grandmother. Allergies  Allergen Reactions   Taxotere [Docetaxel] Swelling   Abraxane [Paclitaxel Protein-Bound Part] Other (See Comments)    3 days after receiving she had one episode of very high BP and HR. This happened again 2 days later during the night.    Crestor [Rosuvastatin Calcium] Itching    GI upset   Lipitor [Atorvastatin Calcium] Itching   Lisinopril Cough   Nsaids Other (See Comments)    GI upset   Pravastatin Itching    GI upset   Sulfonamide Derivatives Rash    Hives, itiching    Review of Systems  Constitutional:  Negative for chills, fever, malaise/fatigue and weight loss.  Eyes:  Negative for blurred vision.  Respiratory:  Positive for cough. Negative for  shortness of breath.   Cardiovascular:  Negative for chest pain.  Neurological:  Negative for dizziness, weakness and headaches.      Objective:     BP (!) 154/70 (BP Location: Left Arm, Patient Position: Sitting, Cuff Size: Normal)   Pulse 75   Temp 98.1 F (36.7 C) (Oral)   Ht '5\' 5"'$  (1.651 m)   Wt 181 lb 14.4 oz (82.5 kg)   SpO2 96%   BMI 30.27 kg/m  BP Readings from Last 3 Encounters:  07/17/22 (!) 154/70  04/21/22 (!) 166/76  04/10/22 136/60   Wt Readings from Last 3 Encounters:  07/17/22 181 lb 14.4 oz (82.5 kg)  04/21/22 179 lb 11.2 oz (81.5 kg)  04/10/22 180 lb 3.2 oz (81.7 kg)      Physical Exam Constitutional:      Appearance: She is well-developed.  Eyes:     Pupils: Pupils are equal, round, and reactive to light.  Neck:     Thyroid: No thyromegaly.     Vascular: No JVD.  Cardiovascular:  Rate and Rhythm: Normal rate and regular rhythm.     Heart sounds:     No gallop.  Pulmonary:     Effort: Pulmonary effort is normal. No respiratory distress.     Breath sounds: Normal breath sounds. No wheezing or rales.  Musculoskeletal:     Cervical back: Neck supple.     Right lower leg: No edema.     Left lower leg: No edema.  Neurological:     Mental Status: She is alert.      No results found for any visits on 07/17/22.    The ASCVD Risk score (Arnett DK, et al., 2019) failed to calculate for the following reasons:   Cannot find a previous HDL lab   Cannot find a previous total cholesterol lab    Assessment & Plan:   #1 chronic cough probably related to current use of ACE inhibitor.  Recent CT chest as above.  Patient denies any red flags such as weight loss, fever, etc. Discontinue lisinopril. Be in touch if cough not fully resolved in a week  #2 hypertension.  Previous intolerance with amlodipine and losartan.  Probable intolerance with ACE inhibitor as above.  We recommended trial of Cardizem CD 120 mg once daily and follow-up in 1 month  reassess -Also discussed nonpharmacologic management.  I strongly advised her to try to lose some weight.  Step up activity with more consistent walking.  Scale back overall calories.  Return in about 1 month (around 08/16/2022).    Carolann Littler, MD

## 2022-07-17 NOTE — Patient Instructions (Signed)
Stop the Lisinopril  Start the Cardizem CD  Let me know if cough not resolving fully in 2 weeks  Lose some weight and establish more consistent exercise.

## 2022-07-18 ENCOUNTER — Other Ambulatory Visit: Payer: Self-pay | Admitting: Family Medicine

## 2022-07-27 ENCOUNTER — Telehealth: Payer: Self-pay | Admitting: Family Medicine

## 2022-07-27 NOTE — Telephone Encounter (Signed)
Left message for patient to call back and schedule Medicare Annual Wellness Visit (AWV) either virtually or in office. Left  my jabber number [phone]   Last AWV ;08/15/21  please schedule at anytime with [facility] Nurse Health Advisor 1 or 2    

## 2022-08-07 ENCOUNTER — Other Ambulatory Visit: Payer: Self-pay | Admitting: Family Medicine

## 2022-08-07 NOTE — Telephone Encounter (Signed)
Pt called to say she no longer wants to take the   diltiazem (CARDIZEM CD) 120 MG 24 hr capsules  ...and would prefer to go back on the lisinopril (ZESTRIL) 10 MG tablets, even though it does give her a post-nasal drip and cough, but she says she will just take something for that, in conjunction with the Lisinopril.  LOV:  07/17/2022  Please advise.   [id number]- [place], [place] - [address] Phone:  [phone] Fax:  [phone]

## 2022-08-08 DIAGNOSIS — Z23 Encounter for immunization: Secondary | ICD-10-CM | POA: Diagnosis not present

## 2022-08-10 ENCOUNTER — Telehealth: Payer: Self-pay | Admitting: Family Medicine

## 2022-08-10 NOTE — Telephone Encounter (Signed)
Pt request a different medication to help with her high blood pressure. Pt also asking about whether she needs another prolia injection and when.

## 2022-08-11 NOTE — Telephone Encounter (Signed)
Patient informed of the message and verbalized understanding 

## 2022-08-11 NOTE — Telephone Encounter (Signed)
Pt called, returning CMA's call. CMA was unavailable. Pt asked that CMA call back at his earliest convenience. 

## 2022-08-11 NOTE — Telephone Encounter (Signed)
Left a message for the patient to return my call.  

## 2022-08-11 NOTE — Telephone Encounter (Signed)
I spoke with the patient and she reported a cough and sore throat mostly at night. Patient reports she takes BP medication in morning time after breakfast. Patient states her cough and sore throat does not happen during the day after taking medication.

## 2022-08-12 ENCOUNTER — Ambulatory Visit (INDEPENDENT_AMBULATORY_CARE_PROVIDER_SITE_OTHER): Payer: Medicare Other | Admitting: Family Medicine

## 2022-08-12 ENCOUNTER — Encounter: Payer: Self-pay | Admitting: Family Medicine

## 2022-08-12 VITALS — BP 158/80 | HR 65 | Temp 98.3°F | Ht 65.0 in | Wt 182.7 lb

## 2022-08-12 DIAGNOSIS — Z23 Encounter for immunization: Secondary | ICD-10-CM

## 2022-08-12 DIAGNOSIS — K219 Gastro-esophageal reflux disease without esophagitis: Secondary | ICD-10-CM | POA: Diagnosis not present

## 2022-08-12 DIAGNOSIS — I10 Essential (primary) hypertension: Secondary | ICD-10-CM

## 2022-08-12 DIAGNOSIS — M818 Other osteoporosis without current pathological fracture: Secondary | ICD-10-CM | POA: Diagnosis not present

## 2022-08-12 NOTE — Patient Instructions (Addendum)
Consider Pepcid 20 mg daily as needed for GERD  STOP the Lisinopril.     Go ahead and start the Cardizem one daily.     Set up Prolia injection for October.

## 2022-08-12 NOTE — Progress Notes (Signed)
Established Patient Office Visit  Subjective   Patient ID: Shelly [name], female    DOB: [birth year]  Age: 76 y.o. MRN: [id number]  Chief Complaint  Patient presents with   Follow-up    Patient presents for follow up on hypertension, states blood pressure at home today was 130/63    HPI   [name] same for multiple issues as follows  Hypertension.  Has been challenging finding medication that she has not had side effects with.  We have recently tried the following medications with the following concerns per patient:  Amlodipine-was controlling blood pressure fairly well but she had concerns that this caused flare of her IBS.  She tried this a couple times and each time had recurrent symptoms  Losartan-patient related diffuse arthralgias and especially back pain in her flank region and again had this a couple of times after stopping and restarting  Lisinopril-dry cough.  This also seems to clear each time she stopped the lisinopril.  She does have frequent postnasal drip symptoms  Patient also has a listed allergy to sulfa so have to be cautious about thiazides.  We had recently prescribed Cardizem and she read up basically about side effects including edema and risk of allergic reaction and never took this.  She has osteoporosis and needs Prolia injection in October.  She was reminded about that.  She has not had any intolerance with the Prolia injections thus far.  She needs flu vaccine.  Increased GERD symptoms recently.  Occurring mostly at night.  Mild improvement with Tums and better improvement with Pepcid.  No dysphagia.  No appetite or weight changes.  Past Medical History:  Diagnosis Date   Allergy    Arthritis    wrist, shoulder    Cancer ([place]) 08/2019   left breast IDC   Diverticulitis    Endometrial polyp    Family history of breast cancer    GERD (gastroesophageal reflux disease)    occasional - diet controlled   Hx of adenomatous colonic polyps     Hyperlipidemia    IBS (irritable bowel syndrome)    Missed abortion    x 1 - resolved - no surgery   Osteoporosis    SVD (spontaneous vaginal delivery)    x 1   Transient vision disturbance 2019   was evaluated by ED, no findings   Vitamin D deficiency    Past Surgical History:  Procedure Laterality Date   BREAST EXCISIONAL BIOPSY Right 08/2019   Benign, lesion    BREAST LUMPECTOMY Left 09/2019   BREAST LUMPECTOMY WITH RADIOACTIVE SEED AND SENTINEL LYMPH NODE BIOPSY Left 10/06/2019   Procedure: LEFT BREAST LUMPECTOMY  X2 WITH RADIOACTIVE SEED X2  AND SENTINEL LYMPH NODE BIOPSY;  Surgeon: Jovita Kussmaul, MD;  Location: [facility];  Service: General;  Laterality: Left;   BREAST LUMPECTOMY WITH RADIOACTIVE SEED LOCALIZATION Right 10/06/2019   Procedure: RIGHT BREAST LUMPECTOMY WITH RADIOACTIVE SEED LOCALIZATION;  Surgeon: Jovita Kussmaul, MD;  Location: [place];  Service: General;  Laterality: Right;   BRONCHIAL BIOPSY  12/27/2020   Procedure: BRONCHIAL BIOPSIES;  Surgeon: Garner Nash, DO;  Location: [facility];  Service: Pulmonary;;   BRONCHIAL BRUSHINGS  12/27/2020   Procedure: BRONCHIAL BRUSHINGS;  Surgeon: Garner Nash, DO;  Location: [facility];  Service: Pulmonary;;   BRONCHIAL NEEDLE ASPIRATION BIOPSY  12/27/2020   Procedure: BRONCHIAL NEEDLE ASPIRATION BIOPSIES;  Surgeon: Garner Nash, DO;  Location: [place];  Service:  Pulmonary;;   BRONCHIAL WASHINGS  12/27/2020   Procedure: BRONCHIAL WASHINGS;  Surgeon: Garner Nash, DO;  Location: [facility];  Service: Pulmonary;;   COLONOSCOPY     DIAGNOSTIC LAPAROSCOPY     cysts   DILATATION & CURETTAGE/HYSTEROSCOPY WITH TRUECLEAR N/A 10/31/2013   Procedure: DILATATION & CURETTAGE/HYSTEROSCOPY WITH TRUCLEAR;  Surgeon: Marylynn Pearson, MD;  Location: [facility];  Service: Gynecology;  Laterality: N/A;   DILATION AND CURETTAGE OF UTERUS     MASS EXCISION Left 11/06/2019   Procedure:  EXCISION NIPPLE AND AREOLA  MARGIN LEFT BREAST;  Surgeon: Jovita Kussmaul, MD;  Location: [place];  Service: General;  Laterality: Left;   TONSILLECTOMY  1952   VIDEO BRONCHOSCOPY WITH ENDOBRONCHIAL NAVIGATION Bilateral 12/27/2020   Procedure: VIDEO BRONCHOSCOPY WITH ENDOBRONCHIAL NAVIGATION;  Surgeon: Garner Nash, DO;  Location: [place];  Service: Pulmonary;  Laterality: Bilateral;   VIDEO BRONCHOSCOPY WITH ENDOBRONCHIAL ULTRASOUND Bilateral 12/27/2020   Procedure: VIDEO BRONCHOSCOPY WITH ENDOBRONCHIAL ULTRASOUND;  Surgeon: Garner Nash, DO;  Location: [place] [facility];  Service: Pulmonary;  Laterality: Bilateral;   WISDOM TOOTH EXTRACTION      reports that she quit smoking about 15 years ago. Her smoking use included cigarettes. She has a 2.00 pack-year smoking history. She has never used smokeless tobacco. She reports current alcohol use of about 14.0 standard drinks of alcohol per week. She reports that she does not use drugs. family history includes Brain cancer in her paternal uncle; Breast cancer (age of onset: 21) in her cousin; Breast cancer (age of onset: 26) in her maternal aunt and maternal aunt; Cancer in her maternal grandfather, maternal uncle, and another family member; Colon polyps in her mother; Depression in her brother; Diabetes in her mother; Heart disease in an other family member; Hyperlipidemia in her father and mother; Hypertension in her mother and another family member; Leukemia (age of onset: 62) in her mother; Lung cancer in her father and maternal grandfather; Throat cancer in her paternal uncle; Uterine cancer in her paternal grandmother. Allergies  Allergen Reactions   Taxotere [Docetaxel] Swelling   Abraxane [Paclitaxel Protein-Bound Part] Other (See Comments)    3 days after receiving she had one episode of very high BP and HR. This happened again 2 days later during the night.    Crestor [Rosuvastatin Calcium] Itching    GI upset   Lipitor  [Atorvastatin Calcium] Itching   Lisinopril Cough   Nsaids Other (See Comments)    GI upset   Pravastatin Itching    GI upset   Sulfonamide Derivatives Rash    Hives, itiching    Review of Systems  Constitutional:  Negative for chills, fever and malaise/fatigue.  Eyes:  Negative for blurred vision.  Respiratory:  Negative for shortness of breath.   Cardiovascular:  Negative for chest pain.  Gastrointestinal:  Negative for abdominal pain.  Genitourinary:  Negative for dysuria.  Neurological:  Negative for dizziness, weakness and headaches.      Objective:     BP (!) 158/80 (BP Location: Right Arm, Cuff Size: Normal)   Pulse 65   Temp 98.3 F (36.8 C) (Oral)   Ht '5\' 5"'$  (1.651 m)   Wt 182 lb 11.2 oz (82.9 kg)   SpO2 97%   BMI 30.40 kg/m  BP Readings from Last 3 Encounters:  08/12/22 (!) 158/80  07/17/22 (!) 154/70  04/21/22 (!) 166/76   Wt Readings from Last 3 Encounters:  08/12/22 182 lb 11.2 oz (82.9 kg)  07/17/22 181 lb 14.4 oz (82.5 kg)  04/21/22 179 lb 11.2 oz (81.5 kg)      Physical Exam Vitals reviewed.  Cardiovascular:     Rate and Rhythm: Normal rate and regular rhythm.  Pulmonary:     Effort: Pulmonary effort is normal.     Breath sounds: Normal breath sounds. No wheezing or rales.  Musculoskeletal:     Right lower leg: No edema.     Left lower leg: No edema.  Neurological:     Mental Status: She is alert.      No results found for any visits on 08/12/22.  Last CBC Lab Results  Component Value Date   WBC 5.6 03/19/2022   HGB 13.9 03/19/2022   HCT 41.4 03/19/2022   MCV 87.9 03/19/2022   MCH 29.5 03/19/2022   RDW 13.9 03/19/2022   PLT 270 08/65/7846   Last metabolic panel Lab Results  Component Value Date   GLUCOSE 103 (H) 03/19/2022   NA 140 03/19/2022   K 4.6 03/19/2022   CL 106 03/19/2022   CO2 29 03/19/2022   BUN 18 03/19/2022   CREATININE 0.96 03/19/2022   GFRNONAA >60 03/19/2022   CALCIUM 9.4 03/19/2022   PROT 7.0  03/19/2022   ALBUMIN 4.2 03/19/2022   BILITOT 0.4 03/19/2022   ALKPHOS 72 03/19/2022   AST 13 (L) 03/19/2022   ALT 14 03/19/2022   ANIONGAP 5 03/19/2022   Last lipids Lab Results  Component Value Date   CHOL 166 09/16/2011   HDL 58.90 09/16/2011   LDLCALC 87 09/16/2011   LDLDIRECT 215.9 06/04/2011   TRIG 103.0 09/16/2011   CHOLHDL 3 09/16/2011      The ASCVD Risk score (Arnett DK, et al., 2019) failed to calculate for the following reasons:   Cannot find a previous HDL lab   Cannot find a previous total cholesterol lab    Assessment & Plan:   #1 hypertension.  Poorly controlled.  Patient has had concern regarding side effects from multiple medications as above including amlodipine, losartan, and lisinopril.  We explained that cough with lisinopril is relatively common.  Her potential side effects from amlodipine and losartan were more uncertain but she noticed she felt like clear recurrent symptoms each time she tried these.  She has history of sulfa allergy.  She never tried the Cardizem  -We again reiterated the importance of nonpharmacologic management with weight control, sodium reduction, regular exercise. -She will try Cardizem CD 120 mg once daily -Set up 33-monthfollow-up  #2 GERD.  Handout on GERD given.  Avoid eating within 2 to 3 hours of bedtime.  Suggest that she try more consistent use of Pepcid 20 mg once or twice daily as needed.  Avoid prolonged PPI use.  #3 osteoporosis.  Schedule repeat Prolia injection for October   Influenza vaccine given   Return in about 2 months (around 10/12/2022).    BCarolann Littler MD

## 2022-08-17 ENCOUNTER — Ambulatory Visit (INDEPENDENT_AMBULATORY_CARE_PROVIDER_SITE_OTHER): Payer: Medicare Other

## 2022-08-17 VITALS — Ht 65.0 in | Wt 180.0 lb

## 2022-08-17 DIAGNOSIS — Z0000 Encounter for general adult medical examination without abnormal findings: Secondary | ICD-10-CM | POA: Diagnosis not present

## 2022-08-17 NOTE — Progress Notes (Signed)
I connected with Shelly [name] today by telephone and verified that I am speaking with the correct person using two identifiers. Location patient: home Location provider: work Persons participating in the virtual visit: [name], Lodico LPN.   I discussed the limitations, risks, security and privacy concerns of performing an evaluation and management service by telephone and the availability of in person appointments. I also discussed with the patient that there may be a patient responsible charge related to this service. The patient expressed understanding and verbally consented to this telephonic visit.    Interactive audio and video telecommunications were attempted between this provider and patient, however failed, due to patient having technical difficulties OR patient did not have access to video capability.  We continued and completed visit with audio only.     Vital signs may be patient reported or missing.  Subjective:   Shelly [name] is a 76 y.o. female who presents for Medicare Annual (Subsequent) preventive examination.  Review of Systems     Cardiac Risk Factors include: advanced age (>73mn, >>25women);dyslipidemia;hypertension     Objective:    Today's Vitals   08/17/22 0911  Weight: 180 lb (81.6 kg)  Height: '5\' 5"'$  (1.651 m)   Body mass index is 29.95 kg/m.     08/17/2022    9:16 AM 08/15/2021   10:01 AM 12/27/2020    6:16 AM 03/06/2020   11:29 AM 02/12/2020   11:43 AM 01/16/2020    1:10 PM 01/03/2020    1:27 PM  Advanced Directives  Does Patient Have a Medical Advance Directive? Yes Yes Yes Yes Yes Yes Yes  Type of [place]of [place] will [place]ienna CenterLiving will Living will;Healthcare Power of Attorney  Living will [place]Living will  Does patient want to make changes to medical advance directive?       No - Patient declined   Copy of HCroy[place]in Chart? No - copy requested No - copy requested    No - copy requested No - copy requested    Current Medications (verified) Outpatient Encounter Medications as of 08/17/2022  Medication Sig   acetaminophen (TYLENOL) 500 MG tablet Take 500 mg by mouth every 6 (six) hours as needed for headache or mild pain.   calcium-vitamin D (OSCAL WITH D) 500-5 MG-MCG tablet Take 1 tablet by mouth daily.   clidinium-chlordiazePOXIDE (LIBRAX) 5-2.5 MG capsule TAKE 1 CAPSULE BY MOUTH DAILY AS NEEDED (IBS).   denosumab (PROLIA) 60 MG/ML SOSY injection Inject 60 mg into the skin every 6 (six) months. Last inj was in October   loratadine (CLARITIN) 10 MG tablet Take 10 mg by mouth daily.   OVER THE COUNTER MEDICATION Tums-as needed   [DISCONTINUED] omeprazole (PRILOSEC) 40 MG capsule Take 40 mg by mouth daily.   [DISCONTINUED] pravastatin (PRAVACHOL) 40 MG tablet Take 1 tablet (40 mg total) by mouth every evening.   [DISCONTINUED] prochlorperazine (COMPAZINE) 10 MG tablet Take 1 tablet (10 mg total) by mouth every 6 (six) hours as needed (Nausea or vomiting).   No facility-administered encounter medications on file as of 08/17/2022.    Allergies (verified) Taxotere [docetaxel], Abraxane [paclitaxel protein-bound part], Crestor [rosuvastatin calcium], Lipitor [atorvastatin calcium], Lisinopril, Nsaids, Pravastatin, and Sulfonamide derivatives   History: Past Medical History:  Diagnosis Date   Allergy    Arthritis    wrist, shoulder    Cancer ([facility] 08/2019   left breast IDC  Diverticulitis    Endometrial polyp    Family history of breast cancer    GERD (gastroesophageal reflux disease)    occasional - diet controlled   Hx of adenomatous colonic polyps    Hyperlipidemia    IBS (irritable bowel syndrome)    Missed abortion    x 1 - resolved - no surgery   Osteoporosis    SVD (spontaneous vaginal delivery)    x 1   Transient vision disturbance 2019   was  evaluated by ED, no findings   Vitamin D deficiency    Past Surgical History:  Procedure Laterality Date   BREAST EXCISIONAL BIOPSY Right 08/2019   Benign, lesion    BREAST LUMPECTOMY Left 09/2019   BREAST LUMPECTOMY WITH RADIOACTIVE SEED AND SENTINEL LYMPH NODE BIOPSY Left 10/06/2019   Procedure: LEFT BREAST LUMPECTOMY  X2 WITH RADIOACTIVE SEED X2  AND SENTINEL LYMPH NODE BIOPSY;  Surgeon: Jovita Kussmaul, MD;  Location: [facility];  Service: General;  Laterality: Left;   BREAST LUMPECTOMY WITH RADIOACTIVE SEED LOCALIZATION Right 10/06/2019   Procedure: RIGHT BREAST LUMPECTOMY WITH RADIOACTIVE SEED LOCALIZATION;  Surgeon: Jovita Kussmaul, MD;  Location: [facility];  Service: General;  Laterality: Right;   BRONCHIAL BIOPSY  12/27/2020   Procedure: BRONCHIAL BIOPSIES;  Surgeon: Garner Nash, DO;  Location: [facility];  Service: Pulmonary;;   BRONCHIAL BRUSHINGS  12/27/2020   Procedure: BRONCHIAL BRUSHINGS;  Surgeon: Garner Nash, DO;  Location: [facility];  Service: Pulmonary;;   BRONCHIAL NEEDLE ASPIRATION BIOPSY  12/27/2020   Procedure: BRONCHIAL NEEDLE ASPIRATION BIOPSIES;  Surgeon: Garner Nash, DO;  Location: [facility];  Service: Pulmonary;;   BRONCHIAL WASHINGS  12/27/2020   Procedure: BRONCHIAL WASHINGS;  Surgeon: Garner Nash, DO;  Location: [facility];  Service: Pulmonary;;   COLONOSCOPY     DIAGNOSTIC LAPAROSCOPY     cysts   DILATATION & CURETTAGE/HYSTEROSCOPY WITH TRUECLEAR N/A 10/31/2013   Procedure: DILATATION & CURETTAGE/HYSTEROSCOPY WITH TRUCLEAR;  Surgeon: Marylynn Pearson, MD;  Location: [facility];  Service: Gynecology;  Laterality: N/A;   DILATION AND CURETTAGE OF UTERUS     MASS EXCISION Left 11/06/2019   Procedure: EXCISION NIPPLE AND AREOLA  MARGIN LEFT BREAST;  Surgeon: Jovita Kussmaul, MD;  Location: [facility];  Service: General;  Laterality: Left;   TONSILLECTOMY  1952   VIDEO BRONCHOSCOPY WITH  ENDOBRONCHIAL NAVIGATION Bilateral 12/27/2020   Procedure: VIDEO BRONCHOSCOPY WITH ENDOBRONCHIAL NAVIGATION;  Surgeon: Garner Nash, DO;  Location: [facility] [place];  Service: Pulmonary;  Laterality: Bilateral;   VIDEO BRONCHOSCOPY WITH ENDOBRONCHIAL ULTRASOUND Bilateral 12/27/2020   Procedure: VIDEO BRONCHOSCOPY WITH ENDOBRONCHIAL ULTRASOUND;  Surgeon: Garner Nash, DO;  Location: [place] [facility];  Service: Pulmonary;  Laterality: Bilateral;   WISDOM TOOTH EXTRACTION     Family History  Problem Relation Age of Onset   Hyperlipidemia Mother    Hypertension Mother    Diabetes Mother        type ll   Leukemia Mother 9   Colon polyps Mother    Lung cancer Father    Hyperlipidemia Father    Breast cancer Cousin 11       maternal first couin   Heart disease Other    Hypertension Other    Cancer Other    Depression Brother    Breast cancer Maternal Aunt 50   Uterine cancer Paternal Grandmother    Breast cancer Maternal Aunt 50   Lung cancer Maternal Grandfather  Cancer Maternal Grandfather        gastric cancer?   Cancer Maternal Uncle        unknown   Brain cancer Paternal Uncle    Throat cancer Paternal Uncle    Colon cancer Neg Hx    Esophageal cancer Neg Hx    Stomach cancer Neg Hx    Rectal cancer Neg Hx    Social History   Socioeconomic History   Marital status: Married    Spouse name: Not on file   Number of children: 1   Years of education: Not on file   Highest education level: Not on file  Occupational History   Not on file  Tobacco Use   Smoking status: Former    Packs/day: 0.10    Years: 20.00    Total pack years: 2.00    Types: Cigarettes    Quit date: 05/15/2007    Years since quitting: 15.2   Smokeless tobacco: Never  Vaping Use   Vaping Use: Never used  Substance and Sexual Activity   Alcohol use: Yes    Alcohol/week: 14.0 standard drinks of alcohol    Types: 14 Glasses of wine per week    Comment: 2 glasses wine every evening   Drug use: No    Sexual activity: Yes    Birth control/protection: Post-menopausal  Other Topics Concern   Not on file  Social History Narrative   Lives with husband in 2 story house   Has one daughter, with 3 grandchildren, local, supportive   Enjoys gardening/walking outside with husband   Occasionally does home exercise calisthenics but not recently.   Well-balanced diet overall   Social Determinants of Health   Financial Resource Strain: Low Risk  (08/17/2022)   Overall Financial Resource Strain (CARDIA)    Difficulty of Paying Living Expenses: Not hard at all  Food Insecurity: No Food Insecurity (08/17/2022)   Hunger Vital Sign    Worried About Running Out of Food in the Last Year: Never true    Ran Out of Food in the Last Year: Never true  Transportation Needs: No Transportation Needs (08/17/2022)   PRAPARE - Hydrologist (Medical): No    Lack of Transportation (Non-Medical): No  Physical Activity: Inactive (08/17/2022)   Exercise Vital Sign    Days of Exercise per Week: 0 days    Minutes of Exercise per Session: 0 min  Stress: No Stress Concern Present (08/17/2022)   [place]    Feeling of Stress : Not at all  Social Connections: Moderately Isolated (08/15/2021)   Social Connection and Isolation Panel [NHANES]    Frequency of Communication with Friends and Family: Three times a week    Frequency of Social Gatherings with Friends and Family: Three times a week    Attends Religious Services: Never    Active Member of Clubs or Organizations: Not on file    Attends Archivist Meetings: Never    Marital Status: Married    Tobacco Counseling Counseling given: Not Answered   Clinical Intake:  Pre-visit preparation completed: Yes  Pain : No/denies pain     Nutritional Status: BMI 25 -29 Overweight Nutritional Risks: None Diabetes: No  How often do you need to have someone help  you when you read instructions, pamphlets, or other written materials from your doctor or pharmacy?: 1 - Never What is the last grade level you completed in school?: 12th grade  Diabetic? no  Interpreter Needed?: No  Information entered by :: NAllen LPN   Activities of Daily Living    08/17/2022    9:18 AM  In your present state of health, do you have any difficulty performing the following activities:  Hearing? 0  Vision? 0  Difficulty concentrating or making decisions? 0  Walking or climbing stairs? 0  Dressing or bathing? 0  Doing errands, shopping? 0  Preparing Food and eating ? N  Using the Toilet? N  In the past six months, have you accidently leaked urine? N  Do you have problems with loss of bowel control? N  Managing your Medications? N  Managing your Finances? N  Housekeeping or managing your Housekeeping? N    Patient Care Team: Eulas Post, MD as PCP - General (Family Medicine) Barbaraann Cao, [place] as Referring Physician (Optometry) Jovita Kussmaul, MD as Consulting Physician (General Surgery) Mauro Kaufmann, RN as Oncology Nurse Navigator Rockwell Germany, RN as Oncology Nurse Navigator Truitt Merle, MD as Consulting Physician (Hematology) Alla Feeling, NP as Nurse Practitioner (Nurse Practitioner) Eppie Gibson, MD as Attending Physician (Radiation Oncology)  Indicate any recent Medical Services you may have received from other than [facility] providers in the past year (date may be approximate).     Assessment:   This is a routine wellness examination for Shelly [name].  Hearing/Vision screen Vision Screening - Comments:: Regular eye exams, [facility]  Dietary issues and exercise activities discussed: Current Exercise Habits: The patient does not participate in regular exercise at present   Goals Addressed             This Visit's Progress    Patient Stated       08/17/2022, wants to watch weight       Depression Screen     08/17/2022    9:17 AM 01/12/2022    3:08 PM 08/15/2021    9:59 AM 08/15/2021    9:58 AM 08/14/2020    9:24 AM 02/10/2019    2:21 PM 01/07/2018    3:22 PM  PHQ 2/9 Scores  PHQ - 2 Score 0 0 0 0 0 1 0  PHQ- 9 Score     0 5     Fall Risk    08/17/2022    9:17 AM 01/12/2022    3:08 PM 08/15/2021   10:02 AM 08/14/2020    9:25 AM 02/10/2019    2:21 PM  [place] in the past year? 0 0 0 0 0  Number falls in past yr: 0 0 0    Injury with Fall? 0 0 0    Risk for fall due to : Medication side effect No Fall Risks   History of fall(s)  Follow up Falls prevention discussed;Falls evaluation completed;Education provided Falls evaluation completed Falls evaluation completed  Education provided;Falls prevention discussed    FALL RISK PREVENTION PERTAINING TO THE HOME:  Any stairs in or around the home? Yes  If so, are there any without handrails? No  Home free of loose throw rugs in walkways, pet beds, electrical cords, etc? Yes  Adequate lighting in your home to reduce risk of falls? Yes   ASSISTIVE DEVICES UTILIZED TO PREVENT FALLS:  Life alert? No  Use of a cane, walker or w/c? No  Grab bars in the bathroom? Yes  Shower chair or bench in shower? Yes  Elevated toilet seat or a handicapped toilet? Yes   TIMED UP AND GO:  Was the test performed? No .      Cognitive Function:        08/17/2022    9:20 AM  6CIT Screen  What Year? 0 points  What month? 0 points  What time? 0 points  Count back from 20 0 points  Months in reverse 0 points  Repeat phrase 2 points  Total Score 2 points    Immunizations Immunization History  Administered Date(s) Administered   Fluad Quad(high Dose 65+) 08/14/2020, 08/19/2021, 08/12/2022   Influenza Split 08/05/2011, 07/28/2012   Influenza, High Dose Seasonal PF 07/30/2015, 08/07/2016, 08/11/2017, 08/05/2018, 08/17/2019   Influenza,inj,Quad PF,6+ Mos 08/01/2013, 07/18/2014   Influenza-Unspecified 08/17/2019   PFIZER Comirnaty(Gray  Top)Covid-19 Tri-Sucrose Vaccine 11/18/2019, 12/26/2019, 02/21/2021   PFIZER(Purple Top)SARS-COV-2 Vaccination 08/12/2020   Pneumococcal Conjugate-13 05/14/2014   Pneumococcal Polysaccharide-23 06/05/2011   Pneumococcal-Unspecified 04/30/2017   Td 12/15/2003   Tdap 05/14/2014   Unspecified SARS-COV-2 Vaccination 07/26/2021, 08/08/2022   Zoster Recombinat (Shingrix) 08/22/2019, 07/15/2022   Zoster, Live 03/13/2013, 08/01/2019    TDAP status: Up to date  Flu Vaccine status: Up to date  Pneumococcal vaccine status: Up to date  Covid-19 vaccine status: Completed vaccines  Qualifies for Shingles Vaccine? Yes   Zostavax completed Yes   Shingrix Completed?: Yes  Screening Tests Health Maintenance  Topic Date Due   COVID-19 Vaccine (7 - Pfizer risk series) 10/03/2022   TETANUS/TDAP  05/14/2024   COLONOSCOPY (Pts 45-50yr Insurance coverage will need to be confirmed)  05/20/2026   Pneumonia Vaccine 76 Years old  Completed   INFLUENZA VACCINE  Completed   DEXA SCAN  Completed   Hepatitis C Screening  Completed   Zoster Vaccines- Shingrix  Completed   HPV VACCINES  Aged Out    Health Maintenance  There are no preventive care reminders to display for this patient.  Colorectal cancer screening: Type of screening: Colonoscopy. Completed 05/20/2021. Repeat every 5 years  Mammogram status: scheduled for 08/24/2022  Bone Density status: Completed 08/22/2021.  Lung Cancer Screening: (Low Dose CT Chest recommended if Age 78-80years, 30 pack-year currently smoking OR have quit w/in 15years.) does not qualify.   Lung Cancer Screening Referral: no  Additional Screening:  Hepatitis C Screening: does qualify; Completed 08/14/2020  Vision Screening: Recommended annual ophthalmology exams for early detection of glaucoma and other disorders of the eye. Is the patient up to date with their annual eye exam?  Yes  Who is the provider or what is the name of the office in which the patient  attends annual eye exams? [facility] not [facility] with a provider, would they like to be referred to a provider to establish care? No .   Dental Screening: Recommended annual dental exams for proper oral hygiene  Community Resource Referral / Chronic Care Management: CRR required this visit?  No   CCM required this visit?  No      Plan:     I have personally reviewed and noted the following in the patient's chart:   Medical and social history Use of alcohol, tobacco or illicit drugs  Current medications and supplements including opioid prescriptions. Patient is not currently taking opioid prescriptions. Functional ability and status Nutritional status Physical activity Advanced directives List of other physicians Hospitalizations, surgeries, and ER visits in previous 12 months Vitals Screenings to include cognitive, depression, and falls Referrals and appointments  In addition, I have reviewed and discussed with patient certain preventive protocols, quality metrics, and best practice recommendations. A written  personalized care plan for preventive services as well as general preventive health recommendations were provided to patient.     Kellie Simmering, LPN   [id number]   Nurse Notes: none  Due to this being a virtual visit, the after visit summary with patients personalized plan was offered to patient via mail or my-chart. Patient would like to access on my-chart

## 2022-08-17 NOTE — Patient Instructions (Signed)
Shelly [name] , Thank you for taking time to come for your Medicare Wellness Visit. I appreciate your ongoing commitment to your health goals. Please review the following plan we discussed and let me know if I can assist you in the future.   Screening recommendations/referrals: Colonoscopy: completed 05/20/2021, due 05/20/2026 Mammogram: scheduled for 08/24/2022 Bone Density: completed 08/22/2021 Recommended yearly ophthalmology/optometry visit for glaucoma screening and checkup Recommended yearly dental visit for hygiene and checkup  Vaccinations: Influenza vaccine: completed 08/12/2022 Pneumococcal vaccine: completed 04/30/2017 Tdap vaccine: completed 05/14/2014, due 05/14/2024 Shingles vaccine: completed   Covid-19: 08/08/2022, 07/26/2021, 02/21/2021, 08/12/2020, 12/26/2019, 11/18/2019  Advanced directives: Please bring a copy of your POA (Power of Attorney) and/or Living Will to your next appointment.   Conditions/risks identified: none  Next appointment: Follow up in one year for your annual wellness visit    Preventive Care 65 Years and Older, Female Preventive care refers to lifestyle choices and visits with your health care provider that can promote health and wellness. What does preventive care include? A yearly physical exam. This is also called an annual well check. Dental exams once or twice a year. Routine eye exams. Ask your health care provider how often you should have your eyes checked. Personal lifestyle choices, including: Daily care of your teeth and gums. Regular physical activity. Eating a healthy diet. Avoiding tobacco and drug use. Limiting alcohol use. Practicing safe sex. Taking low-dose aspirin every day. Taking vitamin and mineral supplements as recommended by your health care provider. What happens during an annual well check? The services and screenings done by your health care provider during your annual well check will depend on your age, overall health, lifestyle  risk factors, and family history of disease. Counseling  Your health care provider may ask you questions about your: Alcohol use. Tobacco use. Drug use. Emotional well-being. Home and relationship well-being. Sexual activity. Eating habits. History of falls. Memory and ability to understand (cognition). Work and work Statistician. Reproductive health. Screening  You may have the following tests or measurements: Height, weight, and BMI. Blood pressure. Lipid and cholesterol levels. These may be checked every 5 years, or more frequently if you are over 52 years old. Skin check. Lung cancer screening. You may have this screening every year starting at age 22 if you have a 30-pack-year history of smoking and currently smoke or have quit within the past 15 years. Fecal occult blood test (FOBT) of the stool. You may have this test every year starting at age 61. Flexible sigmoidoscopy or colonoscopy. You may have a sigmoidoscopy every 5 years or a colonoscopy every 10 years starting at age 33. Hepatitis C blood test. Hepatitis B blood test. Sexually transmitted disease (STD) testing. Diabetes screening. This is done by checking your blood sugar (glucose) after you have not eaten for a while (fasting). You may have this done every 1-3 years. Bone density scan. This is done to screen for osteoporosis. You may have this done starting at age 38. Mammogram. This may be done every 1-2 years. Talk to your health care provider about how often you should have regular mammograms. Talk with your health care provider about your test results, treatment options, and if necessary, the need for more tests. Vaccines  Your health care provider may recommend certain vaccines, such as: Influenza vaccine. This is recommended every year. Tetanus, diphtheria, and acellular pertussis (Tdap, Td) vaccine. You may need a Td booster every 10 years. Zoster vaccine. You may need this after age 31. Pneumococcal  13-valent conjugate (PCV13) vaccine. One dose is recommended after age 74. Pneumococcal polysaccharide (PPSV23) vaccine. One dose is recommended after age 60. Talk to your health care provider about which screenings and vaccines you need and how often you need them. This information is not intended to replace advice given to you by your health care provider. Make sure you discuss any questions you have with your health care provider. Document Released: 11/29/2015 Document Revised: 07/22/2016 Document Reviewed: 09/03/2015 Elsevier Interactive Patient Education  2017 [employer] Prevention in the Home Falls can cause injuries. They can happen to people of all ages. There are many things you can do to make your home safe and to help prevent falls. What can I do on the outside of my home? Regularly fix the edges of walkways and driveways and fix any cracks. Remove anything that might make you trip as you walk through a door, such as a raised step or threshold. Trim any bushes or trees on the path to your home. Use bright outdoor lighting. Clear any walking paths of anything that might make someone trip, such as rocks or tools. Regularly check to see if handrails are loose or broken. Make sure that both sides of any steps have handrails. Any raised decks and porches should have guardrails on the edges. Have any leaves, snow, or ice cleared regularly. Use sand or salt on walking paths during winter. Clean up any spills in your garage right away. This includes oil or grease spills. What can I do in the bathroom? Use night lights. Install grab bars by the toilet and in the tub and shower. Do not use towel bars as grab bars. Use non-skid mats or decals in the tub or shower. If you need to sit down in the shower, use a plastic, non-slip stool. Keep the floor dry. Clean up any water that spills on the floor as soon as it happens. Remove soap buildup in the tub or shower regularly. Attach  bath mats securely with double-sided non-slip rug tape. Do not have throw rugs and other things on the floor that can make you trip. What can I do in the bedroom? Use night lights. Make sure that you have a light by your bed that is easy to reach. Do not use any sheets or blankets that are too big for your bed. They should not hang down onto the floor. Have a firm chair that has side arms. You can use this for support while you get dressed. Do not have throw rugs and other things on the floor that can make you trip. What can I do in the kitchen? Clean up any spills right away. Avoid walking on wet floors. Keep items that you use a lot in easy-to-reach places. If you need to reach something above you, use a strong step stool that has a grab bar. Keep electrical cords out of the way. Do not use floor polish or wax that makes floors slippery. If you must use wax, use non-skid floor wax. Do not have throw rugs and other things on the floor that can make you trip. What can I do with my stairs? Do not leave any items on the stairs. Make sure that there are handrails on both sides of the stairs and use them. Fix handrails that are broken or loose. Make sure that handrails are as long as the stairways. Check any carpeting to make sure that it is firmly attached to the stairs. Fix any carpet that  is loose or worn. Avoid having throw rugs at the top or bottom of the stairs. If you do have throw rugs, attach them to the floor with carpet tape. Make sure that you have a light switch at the top of the stairs and the bottom of the stairs. If you do not have them, ask someone to add them for you. What else can I do to help prevent falls? Wear shoes that: Do not have high heels. Have rubber bottoms. Are comfortable and fit you well. Are closed at the toe. Do not wear sandals. If you use a stepladder: Make sure that it is fully opened. Do not climb a closed stepladder. Make sure that both sides of the  stepladder are locked into place. Ask someone to hold it for you, if possible. Clearly mark and make sure that you can see: Any grab bars or handrails. First and last steps. Where the edge of each step is. Use tools that help you move around (mobility aids) if they are needed. These include: Canes. Walkers. Scooters. Crutches. Turn on the lights when you go into a dark area. Replace any light bulbs as soon as they burn out. Set up your furniture so you have a clear path. Avoid moving your furniture around. If any of your floors are uneven, fix them. If there are any pets around you, be aware of where they are. Review your medicines with your doctor. Some medicines can make you feel dizzy. This can increase your chance of falling. Ask your doctor what other things that you can do to help prevent falls. This information is not intended to replace advice given to you by your health care provider. Make sure you discuss any questions you have with your health care provider. Document Released: 08/29/2009 Document Revised: 04/09/2016 Document Reviewed: 12/07/2014 Elsevier Interactive Patient Education  2017 [employer].

## 2022-08-19 ENCOUNTER — Ambulatory Visit: Payer: Medicare Other

## 2022-08-24 ENCOUNTER — Ambulatory Visit
Admission: RE | Admit: 2022-08-24 | Discharge: 2022-08-24 | Disposition: A | Discharge status: Home / Self Care | Payer: Medicare Other | Source: Ambulatory Visit | Attending: Hematology | Admitting: Hematology

## 2022-08-24 DIAGNOSIS — Z853 Personal history of malignant neoplasm of breast: Secondary | ICD-10-CM | POA: Diagnosis not present

## 2022-08-24 DIAGNOSIS — Z9889 Other specified postprocedural states: Secondary | ICD-10-CM

## 2022-08-26 ENCOUNTER — Ambulatory Visit (INDEPENDENT_AMBULATORY_CARE_PROVIDER_SITE_OTHER): Payer: Medicare Other | Admitting: Family Medicine

## 2022-08-26 ENCOUNTER — Encounter: Payer: Self-pay | Admitting: Family Medicine

## 2022-08-26 VITALS — BP 162/82 | HR 75 | Temp 98.0°F | Ht 65.0 in | Wt 182.2 lb

## 2022-08-26 DIAGNOSIS — I10 Essential (primary) hypertension: Secondary | ICD-10-CM | POA: Diagnosis not present

## 2022-08-26 DIAGNOSIS — J309 Allergic rhinitis, unspecified: Secondary | ICD-10-CM | POA: Diagnosis not present

## 2022-08-26 DIAGNOSIS — R0982 Postnasal drip: Secondary | ICD-10-CM

## 2022-08-26 MED ORDER — LISINOPRIL 10 MG PO TABS: 10.0000 mg | ORAL_TABLET | Freq: Every day | ORAL | 3 refills | Status: DC

## 2022-08-26 NOTE — Progress Notes (Signed)
Established Patient Office Visit  Subjective   Patient ID: Mable Dara, female    DOB: [birth year]  Age: 76 y.o. MRN: [id number]  Chief Complaint  Patient presents with   Medication Consultation    HPI   Ms. Tierno is here for follow-up hypertension.  Refer to multiple recent notes.  Refer to recent note from 08-12-2022 for details regarding recent blood pressure medications and potential side effects  Hypertension.  Has been challenging finding medication that she has not had side effects with.  We have recently tried the following medications with the following concerns per patient:   Amlodipine-was controlling blood pressure fairly well but she had concerns that this caused flare of her IBS.  She tried this a couple times and each time had recurrent symptoms   Losartan-patient related diffuse arthralgias and especially back pain in her flank region and again had this a couple of times after stopping and restarting   Lisinopril-dry cough.  This also seems to clear each time she stopped the lisinopril.  She does have frequent postnasal drip symptoms   Patient also has a listed allergy to sulfa so have to be cautious about thiazides.  We had recently prescribed Cardizem and she read up basically about side effects including edema and risk of allergic reaction and never took this  We wrote for Cardizem and she never took this.  She started reading about potential side effects including edema and was afraid to take this.  What she is requesting is another trial of lisinopril.  In hindsight she states that she had cough after changing to a different make up and after limiting that her cough improved.  However, this also coincided with about the time we stopped her lisinopril.  She does have frequent allergic postnasal drip and takes Claritin though infrequently.  She did not have any evidence for allergic reaction to lisinopril such as hives or angioedema.  Past Medical History:   Diagnosis Date   Allergy    Arthritis    wrist, shoulder    Cancer ([facility]) 08/2019   left breast IDC   Diverticulitis    Endometrial polyp    Family history of breast cancer    GERD (gastroesophageal reflux disease)    occasional - diet controlled   Hx of adenomatous colonic polyps    Hyperlipidemia    IBS (irritable bowel syndrome)    Missed abortion    x 1 - resolved - no surgery   Osteoporosis    SVD (spontaneous vaginal delivery)    x 1   Transient vision disturbance 2019   was evaluated by ED, no findings   Vitamin D deficiency    Past Surgical History:  Procedure Laterality Date   BREAST EXCISIONAL BIOPSY Right 08/2019   Benign, lesion    BREAST LUMPECTOMY Left 09/2019   BREAST LUMPECTOMY WITH RADIOACTIVE SEED AND SENTINEL LYMPH NODE BIOPSY Left 10/06/2019   Procedure: LEFT BREAST LUMPECTOMY  X2 WITH RADIOACTIVE SEED X2  AND SENTINEL LYMPH NODE BIOPSY;  Surgeon: Jovita Kussmaul, MD;  Location: [facility];  Service: General;  Laterality: Left;   BREAST LUMPECTOMY WITH RADIOACTIVE SEED LOCALIZATION Right 10/06/2019   Procedure: RIGHT BREAST LUMPECTOMY WITH RADIOACTIVE SEED LOCALIZATION;  Surgeon: Jovita Kussmaul, MD;  Location: [facility];  Service: General;  Laterality: Right;   BRONCHIAL BIOPSY  12/27/2020   Procedure: BRONCHIAL BIOPSIES;  Surgeon: Garner Nash, DO;  Location: [facility];  Service: Pulmonary;;  BRONCHIAL BRUSHINGS  12/27/2020   Procedure: BRONCHIAL BRUSHINGS;  Surgeon: Garner Nash, DO;  Location: [facility];  Service: Pulmonary;;   BRONCHIAL NEEDLE ASPIRATION BIOPSY  12/27/2020   Procedure: BRONCHIAL NEEDLE ASPIRATION BIOPSIES;  Surgeon: Garner Nash, DO;  Location: [facility];  Service: Pulmonary;;   BRONCHIAL WASHINGS  12/27/2020   Procedure: BRONCHIAL WASHINGS;  Surgeon: Garner Nash, DO;  Location: [facility];  Service: Pulmonary;;   COLONOSCOPY     DIAGNOSTIC LAPAROSCOPY     cysts   DILATATION &  CURETTAGE/HYSTEROSCOPY WITH TRUECLEAR N/A 10/31/2013   Procedure: DILATATION & CURETTAGE/HYSTEROSCOPY WITH TRUCLEAR;  Surgeon: Marylynn Pearson, MD;  Location: [facility];  Service: Gynecology;  Laterality: N/A;   DILATION AND CURETTAGE OF UTERUS     MASS EXCISION Left 11/06/2019   Procedure: EXCISION NIPPLE AND AREOLA  MARGIN LEFT BREAST;  Surgeon: Jovita Kussmaul, MD;  Location: [facility];  Service: General;  Laterality: Left;   TONSILLECTOMY  1952   VIDEO BRONCHOSCOPY WITH ENDOBRONCHIAL NAVIGATION Bilateral 12/27/2020   Procedure: VIDEO BRONCHOSCOPY WITH ENDOBRONCHIAL NAVIGATION;  Surgeon: Garner Nash, DO;  Location: [facility];  Service: Pulmonary;  Laterality: Bilateral;   VIDEO BRONCHOSCOPY WITH ENDOBRONCHIAL ULTRASOUND Bilateral 12/27/2020   Procedure: VIDEO BRONCHOSCOPY WITH ENDOBRONCHIAL ULTRASOUND;  Surgeon: Garner Nash, DO;  Location: [facility];  Service: Pulmonary;  Laterality: Bilateral;   WISDOM TOOTH EXTRACTION      reports that she quit smoking about 15 years ago. Her smoking use included cigarettes. She has a 2.00 pack-year smoking history. She has never used smokeless tobacco. She reports current alcohol use of about 14.0 standard drinks of alcohol per week. She reports that she does not use drugs. family history includes Brain cancer in her paternal uncle; Breast cancer (age of onset: 73) in her cousin; Breast cancer (age of onset: 77) in her maternal aunt and maternal aunt; Cancer in her maternal grandfather, maternal uncle, and another family member; Colon polyps in her mother; Depression in her brother; Diabetes in her mother; Heart disease in an other family member; Hyperlipidemia in her father and mother; Hypertension in her mother and another family member; Leukemia (age of onset: 86) in her mother; Lung cancer in her father and maternal grandfather; Throat cancer in her paternal uncle; Uterine cancer in her paternal grandmother. Allergies  Allergen  Reactions   Taxotere [Docetaxel] Swelling   Abraxane [Paclitaxel Protein-Bound Part] Other (See Comments)    3 days after receiving she had one episode of very high BP and HR. This happened again 2 days later during the night.    Crestor [Rosuvastatin Calcium] Itching    GI upset   Lipitor [Atorvastatin Calcium] Itching   Lisinopril Cough   Nsaids Other (See Comments)    GI upset   Pravastatin Itching    GI upset   Sulfonamide Derivatives Rash    Hives, itiching    Review of Systems  Constitutional:  Negative for malaise/fatigue.  Eyes:  Negative for blurred vision.  Respiratory:  Negative for shortness of breath.   Cardiovascular:  Negative for chest pain.  Neurological:  Negative for dizziness, weakness and headaches.      Objective:     BP (!) 162/82 (BP Location: Left Arm, Cuff Size: Normal)   Pulse 75   Temp 98 F (36.7 C) (Oral)   Ht '5\' 5"'$  (1.651 m)   Wt 182 lb 3.2 oz (82.6 kg)   SpO2 95%   BMI 30.32 kg/m  Physical Exam Vitals reviewed.  Constitutional:      Appearance: She is well-developed.  Eyes:     Pupils: Pupils are equal, round, and reactive to light.  Neck:     Thyroid: No thyromegaly.     Vascular: No JVD.  Cardiovascular:     Rate and Rhythm: Normal rate and regular rhythm.     Heart sounds:     No gallop.  Pulmonary:     Effort: Pulmonary effort is normal. No respiratory distress.     Breath sounds: Normal breath sounds. No wheezing or rales.  Musculoskeletal:     Cervical back: Neck supple.     Right lower leg: No edema.     Left lower leg: No edema.  Neurological:     Mental Status: She is alert.      No results found for any visits on 08/26/22.    The ASCVD Risk score (Arnett DK, et al., 2019) failed to calculate for the following reasons:   Cannot find a previous HDL lab   Cannot find a previous total cholesterol lab    Assessment & Plan:   #1 hypertension.  Poorly controlled.  Patient has had concern for possible side  effects as above with multiple medications including amlodipine, losartan, and lisinopril.  She is fairly convinced that her cough was related to allergies and not lisinopril.  Since she did not have any serious reaction such as angioedema we have agreed to another trial of lisinopril 10 mg daily.  She had concerns about possible side effects with Cardizem and never started that  -She will start back the lisinopril 10 mg daily with new prescription written -If she has recurrent cough be in touch and we will need to look at other alternatives  #2 allergic postnasal drip symptoms.  We have strongly suggested that she take Claritin daily and also add Flonase or Nasacort to that  Return in about 1 month (around 09/26/2022).    Carolann Littler, MD

## 2022-08-26 NOTE — Patient Instructions (Signed)
Go ahead and start the Lisinopril 10 mg daily and be in touch if cough recurs.    Get  back on daily Claritan and Flonase for postnasal drip.

## 2022-09-01 ENCOUNTER — Ambulatory Visit (INDEPENDENT_AMBULATORY_CARE_PROVIDER_SITE_OTHER): Payer: Medicare Other

## 2022-09-01 DIAGNOSIS — M818 Other osteoporosis without current pathological fracture: Secondary | ICD-10-CM

## 2022-09-01 MED ORDER — DENOSUMAB 60 MG/ML ~~LOC~~ SOSY
60.0000 mg | PREFILLED_SYRINGE | Freq: Once | SUBCUTANEOUS | Status: AC
  Administered 2022-09-01: 60 mg via SUBCUTANEOUS

## 2022-09-01 NOTE — Progress Notes (Signed)
Patient came in to receive her Prolia injection. Injection given in the right arm, patient tolerated well.

## 2022-09-20 NOTE — Progress Notes (Signed)
[place]   [phone] [phone]) [phone]   Clinic Follow up Note   Patient Care Team: Eulas Post, MD as PCP - General (Family Medicine) Barbaraann Cao, [place] as Referring Physician (Optometry) Jovita Kussmaul, MD as Consulting Physician (General Surgery) Mauro Kaufmann, RN as Oncology Nurse Navigator Rockwell Germany, RN as Oncology Nurse Navigator Truitt Merle, MD as Consulting Physician (Hematology) Alla Feeling, NP as Nurse Practitioner (Nurse Practitioner) Eppie Gibson, MD as Attending Physician (Radiation Oncology) 09/21/2022  CHIEF COMPLAINT: Follow-up left breast cancer  SUMMARY OF ONCOLOGIC HISTORY: Oncology History Overview Note  Cancer Staging Cancer of central portion of left female breast [facility]) Staging form: Breast, AJCC 8th Edition - Clinical stage from 09/18/2019: Stage IA (cT1b, cN0, cM0, G2, ER+, PR+, HER2-) - Signed by Truitt Merle, MD on 09/18/2019 - Pathologic stage from 10/06/2019: Stage IA (pT1c, pN1a, cM0, G2, ER+, PR+, HER2-) - Signed by Truitt Merle, MD on 10/19/2019    Cancer of central portion of left female breast ([place])  08/24/2019 Mammogram   Diagnostic mammogram 08/24/19  IMPRESSION: 1. Left breast mass measuring 0.8 x 0.4 x 0.5 cm at the 1 o'clock retroareolar region is suspicious and likely corresponds to the area of distortion seen on mammography.   2. Left breast 3 o'clock retroareolar dilated duct with internal debris versus solid mass.   3. Right breast retroareolar focal asymmetry without sonographic correlate.   08/28/2019 Initial Biopsy   Final Diagnosis 08/28/19 1. Breast, left, needle core biopsy, 1 o'clock/retroareolar - INVASIVE DUCTAL CARCINOMA, GRADE II. - PERINEURAL INVASION. - SEE MICROSCOPIC DESCRIPTION. 2. Breast, left, needle core biopsy, 3 o'clock retroareolar - DUCTAL PAPILLOMA. - SEE MICROSCOPIC DESCRIPTION.   08/28/2019 Receptors her2   1. PROGNOSTIC INDICATORS  08/28/19 Results: IMMUNOHISTOCHEMICAL AND MORPHOMETRIC ANALYSIS PERFORMED MANUALLY The tumor cells are NEGATIVE for Her2 (0). Estrogen Receptor: 100%, POSITIVE, STRONG STAINING INTENSITY Progesterone Receptor: 50%, POSITIVE, STRONG STAINING INTENSITY Proliferation Marker Ki67: 12%   09/01/2019 Pathology Results   Diagnosis 09/01/19 Breast, right, needle core biopsy, retroareolar - COMPLEX SCLEROSING LESION WITH A PAPILLARY SCLEROSING LESION AND USUAL DUCTAL HYPERPLASIA. SEE NOTE - NEGATIVE FOR CARCINOMA   09/18/2019 Initial Diagnosis   Cancer of central portion of left female breast ([place])   09/18/2019 Cancer Staging   Staging form: Breast, AJCC 8th Edition - Clinical stage from 09/18/2019: Stage IA (cT1b, cN0, cM0, G2, ER+, PR+, HER2-) - Signed by Truitt Merle, MD on 09/18/2019   10/06/2019 Surgery   LEFT BREAST LUMPECTOMY  X2 WITH RADIOACTIVE SEED X2  AND SENTINEL LYMPH NODE BIOPSY and  RIGHT BREAST LUMPECTOMY WITH RADIOACTIVE SEED LOCALIZATION by Dr Marlou Starks  10/06/19    10/06/2019 Pathology Results   FINAL MICROSCOPIC DIAGNOSIS: 10/06/19   A. BREAST, RIGHT, LUMPECTOMY:  - Fibrocystic change with a small incidental intraductal papilloma and  usual ductal hyperplasia  - Biopsy site changes  - Negative for carcinoma   B. LYMPH NODE, LEFT #1, SENTINEL, BIOPSY:  - Metastatic carcinoma to a lymph node (1/1)  - Focus of metastatic carcinoma measures 2.5 mm and shows extranodal  extension   C. LYMPH NODE, LEFT #2, SENTINEL, BIOPSY:  - Lymph node, negative for carcinoma (0/1)   D. LYMPH NODE, LEFT #3, SENTINEL, BIOPSY:  - Lymph node, negative for carcinoma (0/1)   E. BREAST, LEFT, LUMPECTOMY:  - Invasive ductal carcinoma, grade 2, 1.8 cm  - Ductal carcinoma in situ, intermediate grade  - Invasive carcinoma focally involves the anterior margin  -  DCIS is less than 1 mm from the superior margin  - Background breast parenchyma with fibrocystic change, usual ductal  hyperplasia and  intraductal papilloma.  - Biopsy site changes  - See oncology table    10/06/2019 Miscellaneous   Her Mammaprint showed High Risk Luminal Type B, risk of recurrence in the next 10 years is 29% in high risk disease. MPI -0.335    10/06/2019 Cancer Staging   Staging form: Breast, AJCC 8th Edition - Pathologic stage from 10/06/2019: Stage IA (pT1c, pN1a, cM0, G2, ER+, PR+, HER2-) - Signed by Truitt Merle, MD on 10/19/2019   11/06/2019 Surgery   EXCISION NIPPLE AND AREOLA  MARGIN LEFT BREAST by Dr Marlou Starks  11/06/19    11/06/2019 Pathology Results    FINAL MICROSCOPIC DIAGNOSIS: 11/06/19  A. BREAST, LEFT CENTRAL, RE-EXCISION:  -  Residual ductal carcinoma in situ, intermediate grade  -  Margins uninvolved by carcinoma (0.4 cm; superior margin)  -  Atypical intraductal papilloma  -  Usual ductal hyperplasia  -  Benign skin with seborrheic keratosis  -  Previous surgical site changes  -  No invasive carcinoma identified    12/06/2019 - 12/20/2019 Chemotherapy   Adjuvant chemo TC q3weeks for 4 cycles starting 12/06/19. With first TC infusion she had allergy reaction to docetaxel and [name] was not given on 12/06/19, we discontinued.   -I started her on weekly Abraxane on 12/20/19, but 2 days after infusion she started to feel tachycardic, hot/flushed, and lightheaded. A few days later she had dysuria, diarrhea and abdominal cramps and intermittent chest pain. She opted to d/c chemo and proceed with RT.    01/25/2020 - 03/06/2020 Radiation Therapy   Adjuvant Radiation with Dr Isidore Moos 01/25/20-03/06/20   05/2020 - 07/2020 Anti-estrogen oral therapy   I started her on antiestrogen therapy with Tamoxifen in 03/2020. She took until 06/06/20 and 30m 06/04/20- 06/2020 but stopped due to exacerbated IBS symptoms. She also tried 2 weeks of Exemestane in 07/2020 but stopped due to increased Anxiety.   06/06/2020 Survivorship   Care plan delivered by [name] NP    12/04/2020 Imaging   CT chest  IMPRESSION: 1.  Marked interval worsening of parenchymal disease now involving areas of the LEFT lower lobe, lingula, RIGHT upper lobe and with nodular opacity in the RIGHT lower lobe. Findings could be seen in the setting of COVID-19 infection. Pattern of disease also raises the question of organizing pneumonia. Pneumonitis, drug related changes are also considered. Pattern of disease would be atypical for neoplasm. Pulmonary consultation is suggested if not yet performed. Continued short interval surveillance is also suggested. 2. Post LEFT breast lumpectomy and axillary dissection. 3. Calcified coronary artery disease in LEFT and RIGHT coronary circulation. 4. Stable LEFT adrenal nodule compatible with benign adenoma. 5. Aortic atherosclerosis.   Aortic Atherosclerosis (ICD10-I70.0).     CURRENT THERAPY: Surveillance  INTERVAL HISTORY: Ms. EKincannonreturns for follow-up as scheduled, last seen by Dr. FBurr Medico5/02/2022.  Mammogram 08/24/2022 was negative. She is doing well overall.  She had a stomach virus last week that resolved.  She gets intermittent mild skin rash to left breast lumpectomy scar, usually from dry weather/hard water. She applies hydrocortisone and it goes away.  Denies new lump/mass, nipple discharge or inversion.  Denies bone or any pain, significant fatigue, or unintentional weight loss.  All other systems were reviewed with the patient and are negative.  MEDICAL HISTORY:  Past Medical History:  Diagnosis Date   Allergy  Arthritis    wrist, shoulder    Cancer ([place]) 08/2019   left breast IDC   Diverticulitis    Endometrial polyp    Family history of breast cancer    GERD (gastroesophageal reflux disease)    occasional - diet controlled   Hx of adenomatous colonic polyps    Hyperlipidemia    IBS (irritable bowel syndrome)    Missed abortion    x 1 - resolved - no surgery   Osteoporosis    SVD (spontaneous vaginal delivery)    x 1   Transient vision disturbance 2019   was  evaluated by ED, no findings   Vitamin D deficiency     SURGICAL HISTORY: Past Surgical History:  Procedure Laterality Date   BREAST EXCISIONAL BIOPSY Right 08/2019   Benign, lesion    BREAST LUMPECTOMY Left 09/2019   BREAST LUMPECTOMY WITH RADIOACTIVE SEED AND SENTINEL LYMPH NODE BIOPSY Left 10/06/2019   Procedure: LEFT BREAST LUMPECTOMY  X2 WITH RADIOACTIVE SEED X2  AND SENTINEL LYMPH NODE BIOPSY;  Surgeon: Jovita Kussmaul, MD;  Location: [place];  Service: General;  Laterality: Left;   BREAST LUMPECTOMY WITH RADIOACTIVE SEED LOCALIZATION Right 10/06/2019   Procedure: RIGHT BREAST LUMPECTOMY WITH RADIOACTIVE SEED LOCALIZATION;  Surgeon: Jovita Kussmaul, MD;  Location: [place];  Service: General;  Laterality: Right;   BRONCHIAL BIOPSY  12/27/2020   Procedure: BRONCHIAL BIOPSIES;  Surgeon: Garner Nash, DO;  Location: [place] [facility];  Service: Pulmonary;;   BRONCHIAL BRUSHINGS  12/27/2020   Procedure: BRONCHIAL BRUSHINGS;  Surgeon: Garner Nash, DO;  Location: [place] [facility];  Service: Pulmonary;;   BRONCHIAL NEEDLE ASPIRATION BIOPSY  12/27/2020   Procedure: BRONCHIAL NEEDLE ASPIRATION BIOPSIES;  Surgeon: Garner Nash, DO;  Location: [place] [facility];  Service: Pulmonary;;   BRONCHIAL WASHINGS  12/27/2020   Procedure: BRONCHIAL WASHINGS;  Surgeon: Garner Nash, DO;  Location: [place] [facility];  Service: Pulmonary;;   COLONOSCOPY     DIAGNOSTIC LAPAROSCOPY     cysts   DILATATION & CURETTAGE/HYSTEROSCOPY WITH TRUECLEAR N/A 10/31/2013   Procedure: DILATATION & CURETTAGE/HYSTEROSCOPY WITH TRUCLEAR;  Surgeon: Marylynn Pearson, MD;  Location: [facility];  Service: Gynecology;  Laterality: N/A;   DILATION AND CURETTAGE OF UTERUS     MASS EXCISION Left 11/06/2019   Procedure: EXCISION NIPPLE AND AREOLA  MARGIN LEFT BREAST;  Surgeon: Jovita Kussmaul, MD;  Location: [place];  Service: General;  Laterality: Left;   TONSILLECTOMY  1954   VIDEO  BRONCHOSCOPY WITH ENDOBRONCHIAL NAVIGATION Bilateral 12/27/2020   Procedure: VIDEO BRONCHOSCOPY WITH ENDOBRONCHIAL NAVIGATION;  Surgeon: Garner Nash, DO;  Location: [place];  Service: Pulmonary;  Laterality: Bilateral;   VIDEO BRONCHOSCOPY WITH ENDOBRONCHIAL ULTRASOUND Bilateral 12/27/2020   Procedure: VIDEO BRONCHOSCOPY WITH ENDOBRONCHIAL ULTRASOUND;  Surgeon: Garner Nash, DO;  Location: [place];  Service: Pulmonary;  Laterality: Bilateral;   WISDOM TOOTH EXTRACTION      I have reviewed the social history and family history with the patient and they are unchanged from previous note.  ALLERGIES:  is allergic to taxotere [docetaxel], abraxane [paclitaxel protein-bound part], crestor [rosuvastatin calcium], lipitor [atorvastatin calcium], lisinopril, nsaids, pravastatin, and sulfonamide derivatives.  MEDICATIONS:  Current Outpatient Medications  Medication Sig Dispense Refill   acetaminophen (TYLENOL) 500 MG tablet Take 500 mg by mouth every 6 (six) hours as needed for headache or mild pain.     calcium-vitamin D (OSCAL WITH D) 500-5 MG-MCG tablet Take 1 tablet by mouth  daily.     clidinium-chlordiazePOXIDE (LIBRAX) 5-2.5 MG capsule TAKE 1 CAPSULE BY MOUTH DAILY AS NEEDED (IBS). 90 capsule 0   denosumab (PROLIA) 60 MG/ML SOSY injection Inject 60 mg into the skin every 6 (six) months. Last inj was in October     lisinopril (ZESTRIL) 10 MG tablet Take 1 tablet (10 mg total) by mouth daily. 30 tablet 3   loratadine (CLARITIN) 10 MG tablet Take 10 mg by mouth daily.     OVER THE COUNTER MEDICATION Tums-as needed     No current facility-administered medications for this visit.    PHYSICAL EXAMINATION: ECOG PERFORMANCE STATUS: 0 - Asymptomatic  Vitals:   09/21/22 1025  BP: (!) 167/83  Pulse: 79  Resp: 18  Temp: 98.1 F (36.7 C)  SpO2: 100%   Filed Weights   09/21/22 1025  Weight: 183 lb 12.8 oz (83.4 kg)    GENERAL:alert, no distress and comfortable EYES: sclera  clear NECK: Not mass LYMPH:  no palpable cervical or supraclavicular lymphadenopathy LUNGS:  normal breathing effort HEART:  no lower extremity edema ABDOMEN:abdomen soft, non-tender. NEURO: alert & oriented x 3 with fluent speech, no focal motor/sensory deficits Breast exam: S/p left lumpectomy, incision completely healed with very faint mild dry rash near the surgical scar without palpable mass or nodularity.  Right breast and bilateral axilla are benign.  LABORATORY DATA:  I have reviewed the data as listed    Latest Ref Rng & Units 09/21/2022    9:59 AM 03/19/2022   10:20 AM 11/18/2021   10:13 AM  CBC  WBC 4.0 - 10.5 K/uL 6.9  5.6  6.0   Hemoglobin 12.0 - 15.0 g/dL 13.1  13.9  13.4   Hematocrit 36.0 - 46.0 % 39.4  41.4  40.1   Platelets 150 - 400 K/uL 262  270  263         Latest Ref Rng & Units 09/21/2022    9:59 AM 03/19/2022   10:20 AM 11/18/2021   10:13 AM  CMP  Glucose 70 - 99 mg/dL 105  103  101   BUN 8 - 23 mg/dL _0 Creatinine 0.44 - 1.00 mg/dL 0.94  0.96  0.93   Sodium 135 - 145 mmol/L 141  140  140   Potassium 3.5 - 5.1 mmol/L 5.1  4.6  4.3   Chloride 98 - 111 mmol/L 107  106  104   CO2 22 - 32 mmol/L _1 Calcium 8.9 - 10.3 mg/dL 9.2  9.4  9.9   Total Protein 6.5 - 8.1 g/dL 6.6  7.0  6.9   Total Bilirubin 0.3 - 1.2 mg/dL 0.4  0.4  0.4   Alkaline Phos 38 - 126 U/L 63  72  68   AST 15 - 41 U/L _2 ALT 0 - 44 U/L _3 RADIOGRAPHIC STUDIES: I have personally reviewed the radiological images as listed and agreed with the findings in the report. No results found.   ASSESSMENT & PLAN: Shelly [name] is a 76 y.o. post-menopausal female with    1. Cancer of central portion of left female breast, Stage IA, c(T1bN0M0), ER/PR+, HER2-, Grade II, pT1cN1aM0, stage IA  -Diagnosed in 08/2019, s/p B/l breast lumpectomy and SLNB with Dr. Marlou Starks on 10/06/19. Path showed: 1.8 cm IDC and DCIS, anterior margin focally positive; one positive  lymph node with extranodal extension (1/3). Re-excision on 11/06/19 showed DCIS only, margins negative.  -Mammaprint showed high risk. She did not tolerate TC (allergic reaction to taxol) or Abraxane (symptomatic tachycardia, GI symptoms). -She completed adjuvant RT with Dr Isidore Moos 01/25/20 - 03/06/20.  -She declined genetic testing -She took antiestrogen therapy over 2 months 05/2020-07/2020 and stopped due to poor toleration/side effects (exacerbated IBS from tamoxifen; bone concerns and anxiety from exemestane, declined to restart with SSRI). -Ms. Bohlman is clinically doing well from a breast cancer standpoint.  She has an intermittent dry rash to the left lumpectomy scar that resolves with hydrocortisone,  breast exam reflects this, otherwise benign.  She knows to let us know if rash worsens or fails to resolve.  Overall no clinical concern for breast cancer recurrence.   -Mammogram from 08/2022 was benign. -Continue surveillance.  Next follow-up in 6 months, or sooner if needed.   2. Multifocal infiltrates in bilateral lungs, ?  Organizing pneumonia -chest CT on 09/03/20 showed 2.2 cm area of mixed density in the left upper lobe with surrounding area of groundglass changes.  -s/p bronchoscopy on 12/27/20 by Dr. Valeta Harms. Cytology (from brush, lavage and FNA of LLL and station 7 node) was negative for malignancy, except LLL FNA showed atypical cells.  -Chest CT 04/16/21 showed: waxing and waning appearance of multifocal airspace disease, with complete resolution of previous abnormalities and new consolidation within right upper and lower lobes, overall suggesting underlying organizing pneumonia with indeterminate etiology. -Resolved on CT on 04/22/22    3. Osteoporosis, Vitamin D deficiency -Diagnosed prior to 2007 in [place]. Last DEXA in 06/2015 (-3.2 at [facility]).  -she completed 5 years of Prolia in 01/2021. -repeat DEXA 08/22/21 showed improvement to osteopenia, T-score -1.5 -Patient resumed Prolia  injections with her PCP in October 2022.  -Continue OTC Vit D and Tums (for calcium)   4. IBS, Diarrhea and Acid Reflux -history of IBS, noticed intermittent episodes of diarrhea after taking calcium supplements.  -Her BM are controlled on Librax and Nexium and imodium. -Most recent colonoscopy on 05/20/21 showed multiple colonic polyps that were removed, non-bleeding internal hemorrhoids and diverticulosis. Pathology reveals no evidence of high grade dysphagia or carcinoma.  3-year recall -Continue to monitor.   Plan: -Recent mammogram and today's labs reviewed -Monitor skin rash at the left lumpectomy scar, she knows to call if this worsens or fails to improve -Continue breast cancer surveillance -Follow-up in 6 months, or sooner if needed   All questions were answered. The patient knows to call the clinic with any problems, questions or concerns. No barriers to learning were detected.      Alla Feeling, NP 09/21/22

## 2022-09-21 ENCOUNTER — Inpatient Hospital Stay: Payer: Medicare Other | Attending: Nurse Practitioner | Admitting: Nurse Practitioner

## 2022-09-21 ENCOUNTER — Inpatient Hospital Stay: Payer: Medicare Other

## 2022-09-21 ENCOUNTER — Encounter: Payer: Self-pay | Admitting: Nurse Practitioner

## 2022-09-21 ENCOUNTER — Other Ambulatory Visit: Payer: Self-pay

## 2022-09-21 VITALS — BP 167/83 | HR 79 | Temp 98.1°F | Resp 18 | Ht 65.0 in | Wt 183.8 lb

## 2022-09-21 DIAGNOSIS — K580 Irritable bowel syndrome with diarrhea: Secondary | ICD-10-CM | POA: Diagnosis not present

## 2022-09-21 DIAGNOSIS — Z170 Estrogen receptor positive status [ER+]: Secondary | ICD-10-CM

## 2022-09-21 DIAGNOSIS — Z08 Encounter for follow-up examination after completed treatment for malignant neoplasm: Secondary | ICD-10-CM | POA: Diagnosis not present

## 2022-09-21 DIAGNOSIS — E559 Vitamin D deficiency, unspecified: Secondary | ICD-10-CM | POA: Insufficient documentation

## 2022-09-21 DIAGNOSIS — Z853 Personal history of malignant neoplasm of breast: Secondary | ICD-10-CM | POA: Insufficient documentation

## 2022-09-21 DIAGNOSIS — R21 Rash and other nonspecific skin eruption: Secondary | ICD-10-CM | POA: Diagnosis not present

## 2022-09-21 DIAGNOSIS — M810 Age-related osteoporosis without current pathological fracture: Secondary | ICD-10-CM | POA: Insufficient documentation

## 2022-09-21 DIAGNOSIS — C50112 Malignant neoplasm of central portion of left female breast: Secondary | ICD-10-CM | POA: Diagnosis not present

## 2022-09-21 LAB — CBC WITH DIFFERENTIAL (CANCER CENTER ONLY)
Abs Immature Granulocytes: 0.02 10*3/uL (ref 0.00–0.07)
Basophils Absolute: 0.1 10*3/uL (ref 0.0–0.1)
Basophils Relative: 1 %
Eosinophils Absolute: 0.1 10*3/uL (ref 0.0–0.5)
Eosinophils Relative: 1 %
HCT: 39.4 % (ref 36.0–46.0)
Hemoglobin: 13.1 g/dL (ref 12.0–15.0)
Immature Granulocytes: 0 %
Lymphocytes Relative: 24 %
Lymphs Abs: 1.6 10*3/uL (ref 0.7–4.0)
MCH: 29.4 pg (ref 26.0–34.0)
MCHC: 33.2 g/dL (ref 30.0–36.0)
MCV: 88.3 fL (ref 80.0–100.0)
Monocytes Absolute: 0.5 10*3/uL (ref 0.1–1.0)
Monocytes Relative: 8 %
Neutro Abs: 4.5 10*3/uL (ref 1.7–7.7)
Neutrophils Relative %: 66 %
Platelet Count: 262 10*3/uL (ref 150–400)
RBC: 4.46 MIL/uL (ref 3.87–5.11)
RDW: 13.8 % (ref 11.5–15.5)
WBC Count: 6.9 10*3/uL (ref 4.0–10.5)
nRBC: 0 % (ref 0.0–0.2)

## 2022-09-21 LAB — CMP (CANCER CENTER ONLY)
ALT: 12 U/L (ref 0–44)
AST: 12 U/L — ABNORMAL LOW (ref 15–41)
Albumin: 3.9 g/dL (ref 3.5–5.0)
Alkaline Phosphatase: 63 U/L (ref 38–126)
Anion gap: 4 — ABNORMAL LOW (ref 5–15)
BUN: 16 mg/dL (ref 8–23)
CO2: 30 mmol/L (ref 22–32)
Calcium: 9.2 mg/dL (ref 8.9–10.3)
Chloride: 107 mmol/L (ref 98–111)
Creatinine: 0.94 mg/dL (ref 0.44–1.00)
GFR, Estimated: >60 mL/min (ref >60)
Glucose, Bld: 105 mg/dL — ABNORMAL HIGH (ref 70–99)
Potassium: 5.1 mmol/L (ref 3.5–5.1)
Sodium: 141 mmol/L (ref 135–145)
Total Bilirubin: 0.4 mg/dL (ref 0.3–1.2)
Total Protein: 6.6 g/dL (ref 6.5–8.1)

## 2022-09-23 ENCOUNTER — Ambulatory Visit: Payer: Medicare Other | Admitting: Pulmonary Disease

## 2022-10-19 ENCOUNTER — Encounter: Payer: Self-pay | Admitting: Pulmonary Disease

## 2022-10-19 ENCOUNTER — Ambulatory Visit (INDEPENDENT_AMBULATORY_CARE_PROVIDER_SITE_OTHER): Payer: Medicare Other | Admitting: Pulmonary Disease

## 2022-10-19 VITALS — BP 142/78 | HR 79 | Temp 98.0°F | Ht 65.5 in | Wt 183.5 lb

## 2022-10-19 DIAGNOSIS — Z853 Personal history of malignant neoplasm of breast: Secondary | ICD-10-CM | POA: Diagnosis not present

## 2022-10-19 DIAGNOSIS — R918 Other nonspecific abnormal finding of lung field: Secondary | ICD-10-CM

## 2022-10-19 DIAGNOSIS — R9389 Abnormal findings on diagnostic imaging of other specified body structures: Secondary | ICD-10-CM

## 2022-10-19 NOTE — Progress Notes (Signed)
Synopsis: Referred in February 2022 for abnormal CT chest, history of breast cancer, PCP by Eulas Post, MD  Subjective:   PATIENT ID: Shelly [name] GENDER: female DOB: 09-May-1946, MRN: [id number]  Chief Complaint  Patient presents with   Follow-up    No c/o     This is a 76 year old female, past medical history of arthritis, IBS, hyperlipidemia, osteoporosis, vitamin D deficiency.  Followed by Dr. Annamaria Boots from medical oncology patient was diagnosed in November 2020 with a stage Ia (cT1b, N0, M0, G2, ER positive, PR positive, HER-2 negative, breast cancer patient underwent lumpectomy and sentinel node biopsy, patient completed adjuvant chemotherapy and radiation with Dr. Isidore Moos..  Patient Covid vaccine plus booster.  She felt sick after a Covid exposure but at the time she states she tested negative.  She had a follow-up CT image from oncology on 12/04/2020 which revealed bilateral consolidations and infiltrates.  Recommending close follow-up.  Patient was referred for evaluation by pulmonary regarding abnormality seen on CT imaging.  OV 12/18/2020: Here today for follow-up and evaluation of abnormal CT imaging.  She does complain today of dyspnea on exertion, shortness of breath.  It is usually worse in the morning.  She denies hemoptysis.  She does have night sweats that occur 3-4 times a week.  All of which are new over the past couple of months.  OV 01/20/2021: Here today for follow-up after recent bronchoscopy.  Patient was taken for navigational bronchoscopy on 12/27/2020.  BAL results revealed 53% lymphocytes 3% eos.  Culture results from bronchoscopy had polymicrobial normal flora, fungal cultures to date no growth.  AFB smear negative.  Biopsies/cytology of station 7 and transbronchial biopsies were negative except for left lower lobe fine-needle aspirate with atypical cells present.  The patient has subsequently followed up with medical oncology on 01/13/2021, Dr. Burr Medico, office note  reviewed today.  OV 04/23/2021: Here today for follow-up after recent CT scan of the chest.  Doing well since bronchoscopy.  All culture results negative.  Repeat CT scan of the chest last week reviewed today in the office.  Left-sided consolidation has resolved.  She now has a right lower lobe consolidation.  Her shortness of breath cough night sweats have all ended.  From a respiratory standpoint she feels better.  She is able to get around to complete all of her ADLs.  She has a planned vacation to [place] with her husband at the end of the month.  OV 10/19/2022: Here today for follow-up.  She had CT imaging back in June.  Showed resolution of her previous infiltrates.  From respiratory standpoint she is able to get around and complete all of her activities of daily living.  She has no complaints at this time.  She is more concerned about her blood pressure.  She recently had follow-up regarding blood pressure management with PCP and her annual oncology visits.      Oncology History Overview Note  Cancer Staging Cancer of central portion of left female breast [facility]) Staging form: Breast, AJCC 8th Edition - Clinical stage from 09/18/2019: Stage IA (cT1b, cN0, cM0, G2, ER+, PR+, HER2-) - Signed by Truitt Merle, MD on 09/18/2019 - Pathologic stage from 10/06/2019: Stage IA (pT1c, pN1a, cM0, G2, ER+, PR+, HER2-) - Signed by Truitt Merle, MD on 10/19/2019    Cancer of central portion of left female breast ([place])  08/24/2019 Mammogram   Diagnostic mammogram 08/24/19  IMPRESSION: 1. Left breast mass measuring 0.8 x 0.4 x 0.5  cm at the 1 o'clock retroareolar region is suspicious and likely corresponds to the area of distortion seen on mammography.   2. Left breast 3 o'clock retroareolar dilated duct with internal debris versus solid mass.   3. Right breast retroareolar focal asymmetry without sonographic correlate.   08/28/2019 Initial Biopsy   Final Diagnosis 08/28/19 1. Breast, left, needle core  biopsy, 1 o'clock/retroareolar - INVASIVE DUCTAL CARCINOMA, GRADE II. - PERINEURAL INVASION. - SEE MICROSCOPIC DESCRIPTION. 2. Breast, left, needle core biopsy, 3 o'clock retroareolar - DUCTAL PAPILLOMA. - SEE MICROSCOPIC DESCRIPTION.   08/28/2019 Receptors her2   1. PROGNOSTIC INDICATORS 08/28/19 Results: IMMUNOHISTOCHEMICAL AND MORPHOMETRIC ANALYSIS PERFORMED MANUALLY The tumor cells are NEGATIVE for Her2 (0). Estrogen Receptor: 100%, POSITIVE, STRONG STAINING INTENSITY Progesterone Receptor: 50%, POSITIVE, STRONG STAINING INTENSITY Proliferation Marker Ki67: 12%   09/01/2019 Pathology Results   Diagnosis 09/01/19 Breast, right, needle core biopsy, retroareolar - COMPLEX SCLEROSING LESION WITH A PAPILLARY SCLEROSING LESION AND USUAL DUCTAL HYPERPLASIA. SEE NOTE - NEGATIVE FOR CARCINOMA   09/18/2019 Initial Diagnosis   Cancer of central portion of left female breast ([place])   09/18/2019 Cancer Staging   Staging form: Breast, AJCC 8th Edition - Clinical stage from 09/18/2019: Stage IA (cT1b, cN0, cM0, G2, ER+, PR+, HER2-) - Signed by Truitt Merle, MD on 09/18/2019   10/06/2019 Surgery   LEFT BREAST LUMPECTOMY  X2 WITH RADIOACTIVE SEED X2  AND SENTINEL LYMPH NODE BIOPSY and  RIGHT BREAST LUMPECTOMY WITH RADIOACTIVE SEED LOCALIZATION by Dr Marlou Starks  10/06/19    10/06/2019 Pathology Results   FINAL MICROSCOPIC DIAGNOSIS: 10/06/19   A. BREAST, RIGHT, LUMPECTOMY:  - Fibrocystic change with a small incidental intraductal papilloma and  usual ductal hyperplasia  - Biopsy site changes  - Negative for carcinoma   B. LYMPH NODE, LEFT #1, SENTINEL, BIOPSY:  - Metastatic carcinoma to a lymph node (1/1)  - Focus of metastatic carcinoma measures 2.5 mm and shows extranodal  extension   C. LYMPH NODE, LEFT #2, SENTINEL, BIOPSY:  - Lymph node, negative for carcinoma (0/1)   D. LYMPH NODE, LEFT #3, SENTINEL, BIOPSY:  - Lymph node, negative for carcinoma (0/1)   E. BREAST, LEFT, LUMPECTOMY:   - Invasive ductal carcinoma, grade 2, 1.8 cm  - Ductal carcinoma in situ, intermediate grade  - Invasive carcinoma focally involves the anterior margin  - DCIS is less than 1 mm from the superior margin  - Background breast parenchyma with fibrocystic change, usual ductal  hyperplasia and intraductal papilloma.  - Biopsy site changes  - See oncology table    10/06/2019 Miscellaneous   Her Mammaprint showed High Risk Luminal Type B, risk of recurrence in the next 10 years is 29% in high risk disease. MPI -0.335    10/06/2019 Cancer Staging   Staging form: Breast, AJCC 8th Edition - Pathologic stage from 10/06/2019: Stage IA (pT1c, pN1a, cM0, G2, ER+, PR+, HER2-) - Signed by Truitt Merle, MD on 10/19/2019   11/06/2019 Surgery   EXCISION NIPPLE AND AREOLA  MARGIN LEFT BREAST by Dr Marlou Starks  11/06/19    11/06/2019 Pathology Results    FINAL MICROSCOPIC DIAGNOSIS: 11/06/19  A. BREAST, LEFT CENTRAL, RE-EXCISION:  -  Residual ductal carcinoma in situ, intermediate grade  -  Margins uninvolved by carcinoma (0.4 cm; superior margin)  -  Atypical intraductal papilloma  -  Usual ductal hyperplasia  -  Benign skin with seborrheic keratosis  -  Previous surgical site changes  -  No invasive carcinoma identified  12/06/2019 - 12/20/2019 Chemotherapy   Adjuvant chemo TC q3weeks for 4 cycles starting 12/06/19. With first TC infusion she had allergy reaction to docetaxel and [name] was not given on 12/06/19, we discontinued.   -I started her on weekly Abraxane on 12/20/19, but 2 days after infusion she started to feel tachycardic, hot/flushed, and lightheaded. A few days later she had dysuria, diarrhea and abdominal cramps and intermittent chest pain. She opted to d/c chemo and proceed with RT.    01/25/2020 - 03/06/2020 Radiation Therapy   Adjuvant Radiation with Dr Isidore Moos 01/25/20-03/06/20   05/2020 - 07/2020 Anti-estrogen oral therapy   I started her on antiestrogen therapy with Tamoxifen in 03/2020. She  took until 06/06/20 and 58m 06/04/20- 06/2020 but stopped due to exacerbated IBS symptoms. She also tried 2 weeks of Exemestane in 07/2020 but stopped due to increased Anxiety.   06/06/2020 Survivorship   Care plan delivered by [name] NP    12/04/2020 Imaging   CT chest  IMPRESSION: 1. Marked interval worsening of parenchymal disease now involving areas of the LEFT lower lobe, lingula, RIGHT upper lobe and with nodular opacity in the RIGHT lower lobe. Findings could be seen in the setting of COVID-19 infection. Pattern of disease also raises the question of organizing pneumonia. Pneumonitis, drug related changes are also considered. Pattern of disease would be atypical for neoplasm. Pulmonary consultation is suggested if not yet performed. Continued short interval surveillance is also suggested. 2. Post LEFT breast lumpectomy and axillary dissection. 3. Calcified coronary artery disease in LEFT and RIGHT coronary circulation. 4. Stable LEFT adrenal nodule compatible with benign adenoma. 5. Aortic atherosclerosis.   Aortic Atherosclerosis (ICD10-I70.0).      Past Medical History:  Diagnosis Date   Allergy    Arthritis    wrist, shoulder    [place]   left breast IDC   Diverticulitis    Endometrial polyp    Family history of breast cancer    GERD (gastroesophageal reflux disease)    occasional - diet controlled   Hx of adenomatous colonic polyps    Hyperlipidemia    IBS (irritable bowel syndrome)    Missed abortion    x 1 - resolved - no surgery   Osteoporosis    SVD (spontaneous vaginal delivery)    x 1   Transient vision disturbance 2019   was evaluated by ED, no findings   Vitamin D deficiency      Family History  Problem Relation Age of Onset   Hyperlipidemia Mother    Hypertension Mother    Diabetes Mother        type ll   Leukemia Mother [phone]  Colon polyps Mother    Lung cancer Father    Hyperlipidemia Father    Breast cancer Cousin 325       maternal first couin   Heart disease Other    Hypertension Other    Cancer Other    Depression Brother    Breast cancer Maternal Aunt 50   Uterine cancer Paternal Grandmother    Breast cancer Maternal Aunt 576  Lung cancer Maternal Grandfather    Cancer Maternal Grandfather        gastric cancer?   Cancer Maternal Uncle        unknown   Brain cancer Paternal Uncle    Throat cancer Paternal Uncle    Colon cancer Neg Hx    Esophageal cancer Neg Hx    Stomach cancer Neg  Hx    Rectal cancer Neg Hx      Past Surgical History:  Procedure Laterality Date   BREAST EXCISIONAL BIOPSY Right 08/2019   Benign, lesion    BREAST LUMPECTOMY Left 09/2019   BREAST LUMPECTOMY WITH RADIOACTIVE SEED AND SENTINEL LYMPH NODE BIOPSY Left 10/06/2019   Procedure: LEFT BREAST LUMPECTOMY  X2 WITH RADIOACTIVE SEED X2  AND SENTINEL LYMPH NODE BIOPSY;  Surgeon: Jovita Kussmaul, MD;  Location: [place];  Service: General;  Laterality: Left;   BREAST LUMPECTOMY WITH RADIOACTIVE SEED LOCALIZATION Right 10/06/2019   Procedure: RIGHT BREAST LUMPECTOMY WITH RADIOACTIVE SEED LOCALIZATION;  Surgeon: Jovita Kussmaul, MD;  Location: [place];  Service: General;  Laterality: Right;   BRONCHIAL BIOPSY  12/27/2020   Procedure: BRONCHIAL BIOPSIES;  Surgeon: Garner Nash, DO;  Location: [place] [facility];  Service: Pulmonary;;   BRONCHIAL BRUSHINGS  12/27/2020   Procedure: BRONCHIAL BRUSHINGS;  Surgeon: Garner Nash, DO;  Location: [place] [facility];  Service: Pulmonary;;   BRONCHIAL NEEDLE ASPIRATION BIOPSY  12/27/2020   Procedure: BRONCHIAL NEEDLE ASPIRATION BIOPSIES;  Surgeon: Garner Nash, DO;  Location: [place] [facility];  Service: Pulmonary;;   BRONCHIAL WASHINGS  12/27/2020   Procedure: BRONCHIAL WASHINGS;  Surgeon: Garner Nash, DO;  Location: [place] [facility];  Service: Pulmonary;;   COLONOSCOPY     DIAGNOSTIC LAPAROSCOPY     cysts   DILATATION & CURETTAGE/HYSTEROSCOPY WITH TRUECLEAR  N/A 10/31/2013   Procedure: DILATATION & CURETTAGE/HYSTEROSCOPY WITH TRUCLEAR;  Surgeon: Marylynn Pearson, MD;  Location: [facility];  Service: Gynecology;  Laterality: N/A;   DILATION AND CURETTAGE OF UTERUS     MASS EXCISION Left 11/06/2019   Procedure: EXCISION NIPPLE AND AREOLA  MARGIN LEFT BREAST;  Surgeon: Jovita Kussmaul, MD;  Location: [place];  Service: General;  Laterality: Left;   TONSILLECTOMY  1952   VIDEO BRONCHOSCOPY WITH ENDOBRONCHIAL NAVIGATION Bilateral 12/27/2020   Procedure: VIDEO BRONCHOSCOPY WITH ENDOBRONCHIAL NAVIGATION;  Surgeon: Garner Nash, DO;  Location: [place];  Service: Pulmonary;  Laterality: Bilateral;   VIDEO BRONCHOSCOPY WITH ENDOBRONCHIAL ULTRASOUND Bilateral 12/27/2020   Procedure: VIDEO BRONCHOSCOPY WITH ENDOBRONCHIAL ULTRASOUND;  Surgeon: Garner Nash, DO;  Location: [place];  Service: Pulmonary;  Laterality: Bilateral;   WISDOM TOOTH EXTRACTION      Social History   Socioeconomic History   Marital status: Married    Spouse name: Not on file   Number of children: 1   Years of education: Not on file   Highest education level: Not on file  Occupational History   Not on file  Tobacco Use   Smoking status: Former    Packs/day: 0.10    Years: 20.00    Total pack years: 2.00    Types: Cigarettes    Quit date: 05/15/2007    Years since quitting: 15.4   Smokeless tobacco: Never  Vaping Use   Vaping Use: Never used  Substance and Sexual Activity   Alcohol use: Yes    Alcohol/week: 14.0 standard drinks of alcohol    Types: 14 Glasses of wine per week    Comment: 2 glasses wine every evening   Drug use: No   Sexual activity: Yes    Birth control/protection: Post-menopausal  Other Topics Concern   Not on file  Social History Narrative   Lives with husband in 2 story house   Has one daughter, with 3 grandchildren, local, supportive   Enjoys gardening/walking outside with husband   Occasionally  does home exercise  calisthenics but not recently.   Well-balanced diet overall   Social Determinants of Health   Financial Resource Strain: Low Risk  (08/17/2022)   Overall Financial Resource Strain (CARDIA)    Difficulty of Paying Living Expenses: Not hard at all  Food Insecurity: No Food Insecurity (08/17/2022)   Hunger Vital Sign    Worried About Running Out of Food in the Last Year: Never true    Ran Out of Food in the Last Year: Never true  Transportation Needs: No Transportation Needs (08/17/2022)   PRAPARE - Hydrologist (Medical): No    Lack of Transportation (Non-Medical): No  Physical Activity: Inactive (08/17/2022)   Exercise Vital Sign    Days of Exercise per Week: 0 days    Minutes of Exercise per Session: 0 min  Stress: No Stress Concern Present (08/17/2022)   [place]    Feeling of Stress : Not at all  Social Connections: Moderately Isolated (08/15/2021)   Social Connection and Isolation Panel [NHANES]    Frequency of Communication with Friends and Family: Three times a week    Frequency of Social Gatherings with Friends and Family: Three times a week    Attends Religious Services: Never    Active Member of Clubs or Organizations: Not on file    Attends Club or Organization Meetings: Never    Marital Status: Married  Human resources officer Violence: Not At Risk (08/15/2021)   Humiliation, Afraid, Rape, and Kick questionnaire    Fear of Current or Ex-Partner: No    Emotionally Abused: No    Physically Abused: No    Sexually Abused: No     Allergies  Allergen Reactions   Taxotere [Docetaxel] Swelling   Abraxane [Paclitaxel Protein-Bound Part] Other (See Comments)    3 days after receiving she had one episode of very high BP and HR. This happened again 2 days later during the night.    Crestor [Rosuvastatin Calcium] Itching    GI upset   Lipitor [Atorvastatin Calcium] Itching   Lisinopril Cough    Nsaids Other (See Comments)    GI upset   Pravastatin Itching    GI upset   Sulfonamide Derivatives Rash    Hives, itiching     Outpatient Medications Prior to Visit  Medication Sig Dispense Refill   acetaminophen (TYLENOL) 500 MG tablet Take 500 mg by mouth every 6 (six) hours as needed for headache or mild pain.     calcium-vitamin D (OSCAL WITH D) 500-5 MG-MCG tablet Take 1 tablet by mouth daily.     clidinium-chlordiazePOXIDE (LIBRAX) 5-2.5 MG capsule TAKE 1 CAPSULE BY MOUTH DAILY AS NEEDED (IBS). 90 capsule 0   denosumab (PROLIA) 60 MG/ML SOSY injection Inject 60 mg into the skin every 6 (six) months. Last inj was in October     loratadine (CLARITIN) 10 MG tablet Take 10 mg by mouth daily.     OVER THE COUNTER MEDICATION Tums-as needed     lisinopril (ZESTRIL) 10 MG tablet Take 1 tablet (10 mg total) by mouth daily. (Patient not taking: Reported on 10/19/2022) 30 tablet 3   No facility-administered medications prior to visit.    Review of Systems  Constitutional:  Negative for chills, fever, malaise/fatigue and weight loss.  HENT:  Negative for hearing loss, sore throat and tinnitus.   Eyes:  Negative for blurred vision and double vision.  Respiratory:  Positive for cough and shortness  of breath. Negative for hemoptysis, sputum production, wheezing and stridor.   Cardiovascular:  Negative for chest pain, palpitations, orthopnea, leg swelling and PND.  Gastrointestinal:  Negative for abdominal pain, constipation, diarrhea, heartburn, nausea and vomiting.  Genitourinary:  Negative for dysuria, hematuria and urgency.  Musculoskeletal:  Negative for joint pain and myalgias.  Skin:  Negative for itching and rash.  Neurological:  Negative for dizziness, tingling, weakness and headaches.  Endo/Heme/Allergies:  Negative for environmental allergies. Does not bruise/bleed easily.  Psychiatric/Behavioral:  Negative for depression. The patient is not nervous/anxious and does not have  insomnia.   All other systems reviewed and are negative.    Objective:  Physical Exam Vitals reviewed.  Constitutional:      General: She is not in acute distress.    Appearance: She is well-developed.  HENT:     Head: Normocephalic and atraumatic.  Eyes:     General: No scleral icterus.    Conjunctiva/sclera: Conjunctivae normal.     Pupils: Pupils are equal, round, and reactive to light.  Neck:     Vascular: No JVD.     Trachea: No tracheal deviation.  Cardiovascular:     Rate and Rhythm: Normal rate and regular rhythm.     Heart sounds: Normal heart sounds. No murmur heard. Pulmonary:     Effort: Pulmonary effort is normal. No tachypnea, accessory muscle usage or respiratory distress.     Breath sounds: No stridor. No wheezing, rhonchi or rales.  Abdominal:     General: There is no distension.     Palpations: Abdomen is soft.     Tenderness: There is no abdominal tenderness.  Musculoskeletal:        General: No tenderness.     Cervical back: Neck supple.  Lymphadenopathy:     Cervical: No cervical adenopathy.  Skin:    General: Skin is warm and dry.     Capillary Refill: Capillary refill takes less than 2 seconds.     Findings: No rash.  Neurological:     Mental Status: She is alert and oriented to person, place, and time.  Psychiatric:        Behavior: Behavior normal.     Vitals:   10/19/22 1413  BP: (!) 142/78  Pulse: 79  Temp: 98 F (36.7 C)  TempSrc: Oral  SpO2: 93%  Weight: 183 lb 8 oz (83.2 kg)  Height: 5' 5.5" (1.664 m)   93% on  RA BMI Readings from Last 3 Encounters:  10/19/22 30.07 kg/m  09/21/22 30.59 kg/m  08/26/22 30.32 kg/m   Wt Readings from Last 3 Encounters:  10/19/22 183 lb 8 oz (83.2 kg)  09/21/22 183 lb 12.8 oz (83.4 kg)  08/26/22 182 lb 3.2 oz (82.6 kg)     CBC    Component Value Date/Time   WBC 6.9 09/21/2022 0959   WBC 5.0 02/12/2020 1230   RBC 4.46 09/21/2022 0959   HGB 13.1 09/21/2022 0959   HCT 39.4  09/21/2022 0959   HCT 36.3 01/13/2021 1355   PLT 262 09/21/2022 0959   MCV 88.3 09/21/2022 0959   MCH 29.4 09/21/2022 0959   MCHC 33.2 09/21/2022 0959   RDW 13.8 09/21/2022 0959   LYMPHSABS 1.6 09/21/2022 0959   MONOABS 0.5 09/21/2022 0959   EOSABS 0.1 09/21/2022 0959   BASOSABS 0.1 09/21/2022 0959     Chest Imaging: CT chest 12/04/2020: Dense parenchymal abnormalities within the periphery of the left lower lobe as well as right upper lobe.  Airways adjacent concerning for inflammatory process. The patient's images have been independently reviewed by me.    CT Chest June 2022:  Waxing and waning multifocal opacities within the chest.  The left lower lobe is now clear now she has disease within the right lower lobe.  I suspect this correlates well with diagnosis of organizing pneumonia.  Previous bronchoscopy cytologies were negative. The patient's images have been independently reviewed by me.    CT chest June 2023: Infiltrates improved resolved from previous.  Consistent with inflammatory. I suspect organizing pneumonia. The patient's images have been independently reviewed by me.    Pulmonary Functions Testing Results:     No data to display          FeNO:   Pathology:   12/27/2020: Bronchoscopy cytology from lavage negative, left lower lobe fine-needle aspirate with "atypical cells"  Echocardiogram:   Heart Catheterization:     Assessment & Plan:     ICD-10-CM   1. Abnormal CT of the chest  R93.89     2. Pulmonary infiltrate present on computed tomography  R91.8     3. History of breast cancer  Z85.3       Discussion:  This is a 76 year old female that I initially saw for diffuse parenchymal infiltrates that were nonresolving.  She underwent bronchoscopy biopsy cultures.  I think that we were dealing with an organizing pneumonia.  She did well with just follow-up images.  Dissipated on her own.  Repeat imaging showed improvement without steroids.  Also  makes me wonder whether or not she had parenchymal changes related to a postviral syndrome such as COVID.  Plan: She can follow-up with me in 1 year or as needed. Otherwise she can follow-up as needed. She has routine follow-up already scheduled with primary care and oncology. She is can let us know if anything changes in her symptomatology. We appreciate the consultation.    Current Outpatient Medications:    acetaminophen (TYLENOL) 500 MG tablet, Take 500 mg by mouth every 6 (six) hours as needed for headache or mild pain., Disp: , Rfl:    calcium-vitamin D (OSCAL WITH D) 500-5 MG-MCG tablet, Take 1 tablet by mouth daily., Disp: , Rfl:    clidinium-chlordiazePOXIDE (LIBRAX) 5-2.5 MG capsule, TAKE 1 CAPSULE BY MOUTH DAILY AS NEEDED (IBS)., Disp: 90 capsule, Rfl: 0   denosumab (PROLIA) 60 MG/ML SOSY injection, Inject 60 mg into the skin every 6 (six) months. Last inj was in October, Disp: , Rfl:    loratadine (CLARITIN) 10 MG tablet, Take 10 mg by mouth daily., Disp: , Rfl:    OVER THE COUNTER MEDICATION, Tums-as needed, Disp: , Rfl:    lisinopril (ZESTRIL) 10 MG tablet, Take 1 tablet (10 mg total) by mouth daily. (Patient not taking: Reported on 10/19/2022), Disp: 30 tablet, Rfl: 3   Garner Nash, DO [facility] 10/19/2022 2:42 PM

## 2022-10-19 NOTE — Patient Instructions (Signed)
Thank you for visiting Dr. Jossalin Chervenak at [facility]. Today we recommend the following:  Return if symptoms worsen or fail to improve.    Please do your part to reduce the spread of COVID-19.  

## 2022-11-02 DIAGNOSIS — I83893 Varicose veins of bilateral lower extremities with other complications: Secondary | ICD-10-CM | POA: Diagnosis not present

## 2022-11-02 DIAGNOSIS — R252 Cramp and spasm: Secondary | ICD-10-CM | POA: Diagnosis not present

## 2022-11-11 ENCOUNTER — Telehealth: Payer: Self-pay | Admitting: *Deleted

## 2022-11-11 NOTE — Patient Outreach (Signed)
  Care Coordination   11/11/2022 Name: Shelly [name] MRN: [id number] DOB: 12-16-1945   Care Coordination Outreach Attempts:  An unsuccessful telephone outreach was attempted today to offer the patient information about available care coordination services as a benefit of their health plan.   Follow Up Plan:  Additional outreach attempts will be made to offer the patient care coordination information and services.   Encounter Outcome:  No Answer   Care Coordination Interventions:  No, not indicated    Raina Mina, RN Care Management Coordinator [place] Office [phone]

## 2022-11-12 DIAGNOSIS — M542 Cervicalgia: Secondary | ICD-10-CM | POA: Diagnosis not present

## 2022-11-15 ENCOUNTER — Other Ambulatory Visit: Payer: Self-pay | Admitting: Family Medicine

## 2022-11-24 ENCOUNTER — Encounter: Payer: Self-pay | Admitting: *Deleted

## 2022-11-24 ENCOUNTER — Telehealth: Payer: Self-pay | Admitting: *Deleted

## 2022-11-24 NOTE — Patient Outreach (Signed)
  Care Coordination   Follow Up Visit Note   11/24/2022 Name: Shelly [name] MRN: [id number] DOB: 1946-08-31  Shelly [name] is a 77 y.o. year old female who sees Burchette, Alinda Sierras, MD for primary care. I  spoke with spouse Shelly [name] today  What matters to the patients health and wellness today?  na    Goals Addressed   None     SDOH assessments and interventions completed:  No     Care Coordination Interventions:  No, not indicated   Follow up plan: n/a   Encounter Outcome:  Pt. Request to Call Back   Raina Mina, RN Care Management Coordinator [place] Office [phone]

## 2022-11-24 NOTE — Patient Outreach (Signed)
  Care Coordination   Initial Visit Note   11/24/2022 Name: Ramanda Paules MRN: [id number] DOB: 07-12-1946  Akaisha Truman Hudnall is a 77 y.o. year old female who sees Burchette, Alinda Sierras, MD for primary care. I spoke with  Sonny Masters Favor by phone today.  What matters to the patients health and wellness today?  No needs    Goals Addressed             This Visit's Progress    COMPLETED: Care coordination activity       Care Coordination Interventions: Reviewed medications with patient and discussed adherence with all medications and no needed refills Reviewed scheduled/upcoming provider appointments including sufficient transportation sources Screening for signs and symptoms of depression related to chronic disease state  Assessed social determinant of health barriers Educated on care management services utilizing social workers, Designer, jewellery and pharmacy needs. Pt does not have any issues to address at this time.          SDOH assessments and interventions completed:  Yes  SDOH Interventions Today    Flowsheet Row Most Recent Value  SDOH Interventions   Food Insecurity Interventions Intervention Not Indicated  Housing Interventions Intervention Not Indicated  Transportation Interventions Intervention Not Indicated  Utilities Interventions Intervention Not Indicated        Care Coordination Interventions:  Yes, provided   Follow up plan: No further intervention required.   Encounter Outcome:  Pt. Visit Completed   Raina Mina, RN Care Management Coordinator  Office [phone]

## 2022-11-24 NOTE — Patient Instructions (Signed)
Visit Information  Thank you for taking time to visit with me today. Please don't hesitate to contact me if I can be of assistance to you.   Following are the goals we discussed today:   Goals Addressed             This Visit's Progress    COMPLETED: Care coordination activity       Care Coordination Interventions: Reviewed medications with patient and discussed adherence with all medications and no needed refills Reviewed scheduled/upcoming provider appointments including sufficient transportation sources Screening for signs and symptoms of depression related to chronic disease state  Assessed social determinant of health barriers Educated on care management services utilizing social workers, Designer, jewellery and pharmacy needs. Pt does not have any issues to address at this time.          Please call the care guide team at [phone] if you need to cancel or reschedule your appointment.   If you are experiencing a Mental Health or [place] or need someone to talk to, please call the Suicide and Crisis Lifeline: 988  Patient verbalizes understanding of instructions and care plan provided today and agrees to view in [place]. Active MyChart status and patient understanding of how to access instructions and care plan via MyChart confirmed with patient.     No further follow up required: No needs  Raina Mina, RN Care Management Coordinator [place] Office [phone]

## 2022-11-27 DIAGNOSIS — M542 Cervicalgia: Secondary | ICD-10-CM | POA: Diagnosis not present

## 2022-12-01 DIAGNOSIS — M542 Cervicalgia: Secondary | ICD-10-CM | POA: Diagnosis not present

## 2022-12-10 ENCOUNTER — Other Ambulatory Visit: Payer: Self-pay | Admitting: Family Medicine

## 2022-12-14 ENCOUNTER — Other Ambulatory Visit: Payer: Self-pay | Admitting: Family Medicine

## 2022-12-16 MED ORDER — CILIDINIUM-CHLORDIAZEPOXIDE 2.5-5 MG PO CAPS: 1.0000 | ORAL_CAPSULE | Freq: Every day | ORAL | 0 refills | Status: DC | PRN

## 2022-12-18 DIAGNOSIS — M542 Cervicalgia: Secondary | ICD-10-CM | POA: Diagnosis not present

## 2023-01-12 DIAGNOSIS — M542 Cervicalgia: Secondary | ICD-10-CM | POA: Diagnosis not present

## 2023-01-18 ENCOUNTER — Ambulatory Visit (INDEPENDENT_AMBULATORY_CARE_PROVIDER_SITE_OTHER): Payer: Medicare Other | Admitting: Family Medicine

## 2023-01-18 ENCOUNTER — Encounter: Payer: Self-pay | Admitting: Family Medicine

## 2023-01-18 VITALS — BP 152/68 | HR 82 | Temp 98.2°F | Ht 65.5 in | Wt 182.1 lb

## 2023-01-18 DIAGNOSIS — M549 Dorsalgia, unspecified: Secondary | ICD-10-CM

## 2023-01-18 DIAGNOSIS — L989 Disorder of the skin and subcutaneous tissue, unspecified: Secondary | ICD-10-CM

## 2023-01-18 NOTE — Patient Instructions (Signed)
Set up for skin lesion shave excision.

## 2023-01-18 NOTE — Progress Notes (Signed)
Established Patient Office Visit  Subjective   Patient ID: Shelly [name], female    DOB: 07/18/1946  Age: 77 y.o. MRN: [id number]  Chief Complaint  Patient presents with   Shoulder Pain    Patient complains of shoulder pain, x1 month,    Neck Pain    Patient complains of neck pain, x1 month     HPI   Shelly [name] is seen for the following issues  Left upper back pain for at least 8 weeks.  About 6 weeks ago she went to [facility].  She was seen by sports medicine provider there and received steroid injection and felt like pain was worse afterwards.  She was set up for some physical therapy and they tried some dry needling and exercise without much benefit.  She had similar problem years ago and went [place] physical [place] and felt like she had good results with that.  She had been prescribed muscle relaxer recently but was hesitant to take it for fear of side effects.  She has tried heat without improvement.  Has not tried any recent topical creams.  Pain radiates from the left trapezius up toward the neck at times.  No radiculitis symptoms.  No upper extremity numbness or weakness.  Pain is interfering with day-to-day activities and sleep.  Does not carry anything heavy on her left side.  Spends minimal time at computer.  Denies specific injury.  Other issue is irritated skin lesion right arm.  She has scaly raised lesion that recently was irritated and bled some.  Has been present for several months.  Past Medical History:  Diagnosis Date   Allergy    Arthritis    wrist, shoulder    Cancer ([place]) 08/2019   left breast IDC   Diverticulitis    Endometrial polyp    Family history of breast cancer    GERD (gastroesophageal reflux disease)    occasional - diet controlled   Hx of adenomatous colonic polyps    Hyperlipidemia    IBS (irritable bowel syndrome)    Missed abortion    x 1 - resolved - no surgery   Osteoporosis    SVD (spontaneous vaginal delivery)    x 1    Transient vision disturbance 2019   was evaluated by ED, no findings   Vitamin D deficiency    Past Surgical History:  Procedure Laterality Date   BREAST EXCISIONAL BIOPSY Right 08/2019   Benign, lesion    BREAST LUMPECTOMY Left 09/2019   BREAST LUMPECTOMY WITH RADIOACTIVE SEED AND SENTINEL LYMPH NODE BIOPSY Left 10/06/2019   Procedure: LEFT BREAST LUMPECTOMY  X2 WITH RADIOACTIVE SEED X2  AND SENTINEL LYMPH NODE BIOPSY;  Surgeon: Jovita Kussmaul, MD;  Location: [place];  Service: General;  Laterality: Left;   BREAST LUMPECTOMY WITH RADIOACTIVE SEED LOCALIZATION Right 10/06/2019   Procedure: RIGHT BREAST LUMPECTOMY WITH RADIOACTIVE SEED LOCALIZATION;  Surgeon: Jovita Kussmaul, MD;  Location: [place] [facility];  Service: General;  Laterality: Right;   BRONCHIAL BIOPSY  12/27/2020   Procedure: BRONCHIAL BIOPSIES;  Surgeon: Garner Nash, DO;  Location: [facility];  Service: Pulmonary;;   BRONCHIAL BRUSHINGS  12/27/2020   Procedure: BRONCHIAL BRUSHINGS;  Surgeon: Garner Nash, DO;  Location: [place] [facility];  Service: Pulmonary;;   BRONCHIAL NEEDLE ASPIRATION BIOPSY  12/27/2020   Procedure: BRONCHIAL NEEDLE ASPIRATION BIOPSIES;  Surgeon: Garner Nash, DO;  Location: [facility];  Service: Pulmonary;;   BRONCHIAL WASHINGS  12/27/2020  Procedure: BRONCHIAL WASHINGS;  Surgeon: Garner Nash, DO;  Location: [facility];  Service: Pulmonary;;   COLONOSCOPY     DIAGNOSTIC LAPAROSCOPY     cysts   DILATATION & CURETTAGE/HYSTEROSCOPY WITH TRUECLEAR N/A 10/31/2013   Procedure: DILATATION & CURETTAGE/HYSTEROSCOPY WITH TRUCLEAR;  Surgeon: Marylynn Pearson, MD;  Location: [facility];  Service: Gynecology;  Laterality: N/A;   DILATION AND CURETTAGE OF UTERUS     MASS EXCISION Left 11/06/2019   Procedure: EXCISION NIPPLE AND AREOLA  MARGIN LEFT BREAST;  Surgeon: Jovita Kussmaul, MD;  Location: [place];  Service: General;  Laterality: Left;   TONSILLECTOMY   1952   VIDEO BRONCHOSCOPY WITH ENDOBRONCHIAL NAVIGATION Bilateral 12/27/2020   Procedure: VIDEO BRONCHOSCOPY WITH ENDOBRONCHIAL NAVIGATION;  Surgeon: Garner Nash, DO;  Location: [facility];  Service: Pulmonary;  Laterality: Bilateral;   VIDEO BRONCHOSCOPY WITH ENDOBRONCHIAL ULTRASOUND Bilateral 12/27/2020   Procedure: VIDEO BRONCHOSCOPY WITH ENDOBRONCHIAL ULTRASOUND;  Surgeon: Garner Nash, DO;  Location: [place];  Service: Pulmonary;  Laterality: Bilateral;   WISDOM TOOTH EXTRACTION      reports that she quit smoking about 15 years ago. Her smoking use included cigarettes. She has a 2.00 pack-year smoking history. She has never used smokeless tobacco. She reports current alcohol use of about 14.0 standard drinks of alcohol per week. She reports that she does not use drugs. family history includes Brain cancer in her paternal uncle; Breast cancer (age of onset: 59) in her cousin; Breast cancer (age of onset: 78) in her maternal aunt and maternal aunt; Cancer in her maternal grandfather, maternal uncle, and another family member; Colon polyps in her mother; Depression in her brother; Diabetes in her mother; Heart disease in an other family member; Hyperlipidemia in her father and mother; Hypertension in her mother and another family member; Leukemia (age of onset: 5) in her mother; Lung cancer in her father and maternal grandfather; Throat cancer in her paternal uncle; Uterine cancer in her paternal grandmother. Allergies  Allergen Reactions   Taxotere [Docetaxel] Swelling   Abraxane [Paclitaxel Protein-Bound Part] Other (See Comments)    3 days after receiving she had one episode of very high BP and HR. This happened again 2 days later during the night.    Crestor [Rosuvastatin Calcium] Itching    GI upset   Lipitor [Atorvastatin Calcium] Itching   Lisinopril Cough   Nsaids Other (See Comments)    GI upset   Pravastatin Itching    GI upset   Sulfonamide Derivatives Rash     Hives, itiching    Review of Systems  Constitutional:  Negative for chills and fever.  Cardiovascular:  Negative for chest pain.  Musculoskeletal:  Positive for back pain.  Neurological:  Negative for sensory change and weakness.      Objective:     BP (!) 152/68 (BP Location: Right Arm, Cuff Size: Normal)   Pulse 82   Temp 98.2 F (36.8 C) (Oral)   Ht 5' 5.5" (1.664 m)   Wt 182 lb 1.6 oz (82.6 kg)   SpO2 96%   BMI 29.84 kg/m  BP Readings from Last 3 Encounters:  01/18/23 (!) 152/68  10/19/22 (!) 142/78  09/21/22 (!) 167/83   Wt Readings from Last 3 Encounters:  01/18/23 182 lb 1.6 oz (82.6 kg)  10/19/22 183 lb 8 oz (83.2 kg)  09/21/22 183 lb 12.8 oz (83.4 kg)      Physical Exam Vitals reviewed.  Constitutional:      Appearance:  Normal appearance.  Cardiovascular:     Rate and Rhythm: Normal rate and regular rhythm.  Pulmonary:     Effort: Pulmonary effort is normal.     Breath sounds: Normal breath sounds.  Musculoskeletal:     Comments: She has palpable muscle tension left trapezius compared to the right.  Some localized tenderness.  Fair range of motion cervical spine  Skin:    Comments: Raised approximately 8 mm scaly lesion with some dried blood around the base.  Well-demarcated.  Neurological:     Mental Status: She is alert.     Comments: No Focal weakness.      No results found for any visits on 01/18/23.    The ASCVD Risk score (Arnett DK, et al., 2019) failed to calculate for the following reasons:   Cannot find a previous HDL lab   Cannot find a previous total cholesterol lab    Assessment & Plan:   #1 left upper back pain.  Several month duration.  Not improved previously with steroid injection per sports medicine.  -Set up to local physical therapist whom she has had good response with in the past. -She was previously prescribed muscle relaxer but had concerns for side effects -May benefit from some local massage therapy to the  muscles -Continue topical heat  #2 probable irritated seborrheic keratosis right arm.  Patient would like to have excised.  This is irritated because of location.  She will schedule to return for shave excision No follow-ups on file.    Carolann Littler, MD

## 2023-01-22 ENCOUNTER — Ambulatory Visit (INDEPENDENT_AMBULATORY_CARE_PROVIDER_SITE_OTHER): Payer: Medicare Other | Admitting: Family Medicine

## 2023-01-22 ENCOUNTER — Encounter: Payer: Self-pay | Admitting: Family Medicine

## 2023-01-22 VITALS — BP 148/58 | HR 90 | Temp 98.3°F | Ht 65.5 in | Wt 184.3 lb

## 2023-01-22 DIAGNOSIS — L820 Inflamed seborrheic keratosis: Secondary | ICD-10-CM

## 2023-01-22 DIAGNOSIS — I10 Essential (primary) hypertension: Secondary | ICD-10-CM | POA: Diagnosis not present

## 2023-01-22 NOTE — Progress Notes (Signed)
Established Patient Office Visit  Subjective   Patient ID: Shelly [name], female    DOB: [birth year]  Age: 77 y.o. MRN: [id number]  Chief Complaint  Patient presents with   Skin Problem    HPI   [name] had been scheduled for follow-up today to consider removal of irritated skin lesion right posterior arm.  This looks like a irritated verrucous keratosis.  She placed a Band-Aid over this recently after pulling at this and when she took the Band-Aid off the lesion was gone.  She has hypertension and is currently not on any medication.  She had cough with lisinopril.  We tried angiotensin receptor blocker and calcium channel blocker and she had concern for side effects with both.  She states she is monitoring home blood pressures and they have been 120s and down as low as [id number] systolic.  No current headaches.  No peripheral edema.  Past Medical History:  Diagnosis Date   Allergy    Arthritis    wrist, shoulder    Cancer ([place]) 08/2019   left breast IDC   Diverticulitis    Endometrial polyp    Family history of breast cancer    GERD (gastroesophageal reflux disease)    occasional - diet controlled   Hx of adenomatous colonic polyps    Hyperlipidemia    IBS (irritable bowel syndrome)    Missed abortion    x 1 - resolved - no surgery   Osteoporosis    SVD (spontaneous vaginal delivery)    x 1   Transient vision disturbance 2019   was evaluated by ED, no findings   Vitamin D deficiency    Past Surgical History:  Procedure Laterality Date   BREAST EXCISIONAL BIOPSY Right 08/2019   Benign, lesion    BREAST LUMPECTOMY Left 09/2019   BREAST LUMPECTOMY WITH RADIOACTIVE SEED AND SENTINEL LYMPH NODE BIOPSY Left 10/06/2019   Procedure: LEFT BREAST LUMPECTOMY  X2 WITH RADIOACTIVE SEED X2  AND SENTINEL LYMPH NODE BIOPSY;  Surgeon: Jovita Kussmaul, MD;  Location: [facility];  Service: General;  Laterality: Left;   BREAST LUMPECTOMY WITH RADIOACTIVE SEED  LOCALIZATION Right 10/06/2019   Procedure: RIGHT BREAST LUMPECTOMY WITH RADIOACTIVE SEED LOCALIZATION;  Surgeon: Jovita Kussmaul, MD;  Location: [place];  Service: General;  Laterality: Right;   BRONCHIAL BIOPSY  12/27/2020   Procedure: BRONCHIAL BIOPSIES;  Surgeon: Garner Nash, DO;  Location: [facility];  Service: Pulmonary;;   BRONCHIAL BRUSHINGS  12/27/2020   Procedure: BRONCHIAL BRUSHINGS;  Surgeon: Garner Nash, DO;  Location: [facility];  Service: Pulmonary;;   BRONCHIAL NEEDLE ASPIRATION BIOPSY  12/27/2020   Procedure: BRONCHIAL NEEDLE ASPIRATION BIOPSIES;  Surgeon: Garner Nash, DO;  Location: [facility];  Service: Pulmonary;;   BRONCHIAL WASHINGS  12/27/2020   Procedure: BRONCHIAL WASHINGS;  Surgeon: Garner Nash, DO;  Location: [facility];  Service: Pulmonary;;   COLONOSCOPY     DIAGNOSTIC LAPAROSCOPY     cysts   DILATATION & CURETTAGE/HYSTEROSCOPY WITH TRUECLEAR N/A 10/31/2013   Procedure: DILATATION & CURETTAGE/HYSTEROSCOPY WITH TRUCLEAR;  Surgeon: Marylynn Pearson, MD;  Location: [facility];  Service: Gynecology;  Laterality: N/A;   DILATION AND CURETTAGE OF UTERUS     MASS EXCISION Left 11/06/2019   Procedure: EXCISION NIPPLE AND AREOLA  MARGIN LEFT BREAST;  Surgeon: Jovita Kussmaul, MD;  Location: [facility];  Service: General;  Laterality: Left;   [place]  WITH ENDOBRONCHIAL NAVIGATION Bilateral 12/27/2020   Procedure: VIDEO BRONCHOSCOPY WITH ENDOBRONCHIAL NAVIGATION;  Surgeon: Garner Nash, DO;  Location: [place];  Service: Pulmonary;  Laterality: Bilateral;   VIDEO BRONCHOSCOPY WITH ENDOBRONCHIAL ULTRASOUND Bilateral 12/27/2020   Procedure: VIDEO BRONCHOSCOPY WITH ENDOBRONCHIAL ULTRASOUND;  Surgeon: Garner Nash, DO;  Location: [place];  Service: Pulmonary;  Laterality: Bilateral;   WISDOM TOOTH EXTRACTION      reports that she quit smoking about 15 years ago. Her smoking use  included cigarettes. She has a 2.00 pack-year smoking history. She has never used smokeless tobacco. She reports current alcohol use of about 14.0 standard drinks of alcohol per week. She reports that she does not use drugs. family history includes Brain cancer in her paternal uncle; Breast cancer (age of onset: 52) in her cousin; Breast cancer (age of onset: 80) in her maternal aunt and maternal aunt; Cancer in her maternal grandfather, maternal uncle, and another family member; Colon polyps in her mother; Depression in her brother; Diabetes in her mother; Heart disease in an other family member; Hyperlipidemia in her father and mother; Hypertension in her mother and another family member; Leukemia (age of onset: 62) in her mother; Lung cancer in her father and maternal grandfather; Throat cancer in her paternal uncle; Uterine cancer in her paternal grandmother. Allergies  Allergen Reactions   Taxotere [Docetaxel] Swelling   Abraxane [Paclitaxel Protein-Bound Part] Other (See Comments)    3 days after receiving she had one episode of very high BP and HR. This happened again 2 days later during the night.    Crestor [Rosuvastatin Calcium] Itching    GI upset   Lipitor [Atorvastatin Calcium] Itching   Lisinopril Cough   Nsaids Other (See Comments)    GI upset   Pravastatin Itching    GI upset   Sulfonamide Derivatives Rash    Hives, itiching    Review of Systems  Constitutional:  Negative for malaise/fatigue.  Eyes:  Negative for blurred vision.  Respiratory:  Negative for shortness of breath.   Cardiovascular:  Negative for chest pain.  Neurological:  Negative for dizziness, weakness and headaches.      Objective:     BP (!) 148/58 (BP Location: Right Arm, Cuff Size: Normal)   Pulse 90   Temp 98.3 F (36.8 C) (Oral)   Ht 5' 5.5" (1.664 m)   Wt 184 lb 4.8 oz (83.6 kg)   SpO2 95%   BMI 30.20 kg/m    Physical Exam Vitals reviewed.  Constitutional:      General: She is not in  acute distress. Cardiovascular:     Rate and Rhythm: Normal rate and regular rhythm.  Pulmonary:     Effort: Pulmonary effort is normal.     Breath sounds: Normal breath sounds. No wheezing or rales.  Skin:    Comments: Right posterior arm reveals approximately 4 x 5 mm area of erythema where skin lesion had been attached and is now gone.  No signs of secondary infection.  Neurological:     Mental Status: She is alert.      No results found for any visits on 01/22/23.    The ASCVD Risk score (Arnett DK, et al., 2019) failed to calculate for the following reasons:   Cannot find a previous HDL lab   Cannot find a previous total cholesterol lab    Assessment & Plan:   #1 recent irritated seborrheic keratosis which fell out off after some self-induced mild trauma.  We  did explained this may likely grow back at some point and if it does could be treated with liquid nitrogen.  She will observe for now  #2 hypertension with suspected whitecoat syndrome.  Home blood pressure well-controlled.  Continue close monitoring.  Will continue to encourage weight loss and low-sodium diet  Carolann Littler, MD

## 2023-01-25 NOTE — Therapy (Signed)
OUTPATIENT PHYSICAL THERAPY CERVICAL EVALUATION   Patient Name: Shelly [name] MRN: [id number] DOB:1946/06/10, 77 y.o., female Today's Date: 01/26/2023  END OF SESSION:  PT End of Session - 01/26/23 0804     Visit Number 1    Date for PT Re-Evaluation 03/23/23    PT Start Time 0804    PT Stop Time [id number]    PT Time Calculation (min) 40 min    Activity Tolerance Patient tolerated treatment well    Behavior During Therapy [facility] for tasks assessed/performed             Past Medical History:  Diagnosis Date   Allergy    Arthritis    wrist, shoulder    Cancer ([place]) 08/2019   left breast IDC   Diverticulitis    Endometrial polyp    Family history of breast cancer    GERD (gastroesophageal reflux disease)    occasional - diet controlled   Hx of adenomatous colonic polyps    Hyperlipidemia    IBS (irritable bowel syndrome)    Missed abortion    x 1 - resolved - no surgery   Osteoporosis    SVD (spontaneous vaginal delivery)    x 1   Transient vision disturbance 2019   was evaluated by ED, no findings   Vitamin D deficiency    Past Surgical History:  Procedure Laterality Date   BREAST EXCISIONAL BIOPSY Right 08/2019   Benign, lesion    BREAST LUMPECTOMY Left 09/2019   BREAST LUMPECTOMY WITH RADIOACTIVE SEED AND SENTINEL LYMPH NODE BIOPSY Left 10/06/2019   Procedure: LEFT BREAST LUMPECTOMY  X2 WITH RADIOACTIVE SEED X2  AND SENTINEL LYMPH NODE BIOPSY;  Surgeon: Jovita Kussmaul, MD;  Location: [place];  Service: General;  Laterality: Left;   BREAST LUMPECTOMY WITH RADIOACTIVE SEED LOCALIZATION Right 10/06/2019   Procedure: RIGHT BREAST LUMPECTOMY WITH RADIOACTIVE SEED LOCALIZATION;  Surgeon: Jovita Kussmaul, MD;  Location: [place];  Service: General;  Laterality: Right;   BRONCHIAL BIOPSY  12/27/2020   Procedure: BRONCHIAL BIOPSIES;  Surgeon: Garner Nash, DO;  Location: [place] [facility];  Service: Pulmonary;;   BRONCHIAL BRUSHINGS   12/27/2020   Procedure: BRONCHIAL BRUSHINGS;  Surgeon: Garner Nash, DO;  Location: [place] [facility];  Service: Pulmonary;;   BRONCHIAL NEEDLE ASPIRATION BIOPSY  12/27/2020   Procedure: BRONCHIAL NEEDLE ASPIRATION BIOPSIES;  Surgeon: Garner Nash, DO;  Location: [place] [facility];  Service: Pulmonary;;   BRONCHIAL WASHINGS  12/27/2020   Procedure: BRONCHIAL WASHINGS;  Surgeon: Garner Nash, DO;  Location: [place] [facility];  Service: Pulmonary;;   COLONOSCOPY     DIAGNOSTIC LAPAROSCOPY     cysts   DILATATION & CURETTAGE/HYSTEROSCOPY WITH TRUECLEAR N/A 10/31/2013   Procedure: DILATATION & CURETTAGE/HYSTEROSCOPY WITH TRUCLEAR;  Surgeon: Marylynn Pearson, MD;  Location: [place] [facility];  Service: Gynecology;  Laterality: N/A;   DILATION AND CURETTAGE OF UTERUS     MASS EXCISION Left 11/06/2019   Procedure: EXCISION NIPPLE AND AREOLA  MARGIN LEFT BREAST;  Surgeon: Jovita Kussmaul, MD;  Location: [place];  Service: General;  Laterality: Left;   TONSILLECTOMY  1952   VIDEO BRONCHOSCOPY WITH ENDOBRONCHIAL NAVIGATION Bilateral 12/27/2020   Procedure: VIDEO BRONCHOSCOPY WITH ENDOBRONCHIAL NAVIGATION;  Surgeon: Garner Nash, DO;  Location: [place];  Service: Pulmonary;  Laterality: Bilateral;   VIDEO BRONCHOSCOPY WITH ENDOBRONCHIAL ULTRASOUND Bilateral 12/27/2020   Procedure: VIDEO BRONCHOSCOPY WITH ENDOBRONCHIAL ULTRASOUND;  Surgeon: Garner Nash, DO;  Location: [place];  Service: Pulmonary;  Laterality: Bilateral;   WISDOM TOOTH EXTRACTION     Patient Active Problem List   Diagnosis Date Noted   Urinary frequency 04/12/2022   Acute bilateral low back pain without sciatica 04/12/2022   Essential hypertension 01/14/2022   Lung mass    Lesion of lung 12/04/2020   History of adenomatous polyp of colon 08/14/2020   Family history of breast cancer    Cancer of central portion of left female breast ([place]) 09/18/2019   Prediabetes 04/06/2017   Osteoporosis 06/12/2015   IBS  (irritable bowel syndrome) 08/21/2014   Hyperlipidemia 08/05/2011   ABDOMINAL PAIN RIGHT UPPER QUADRANT 10/30/2008   ABDOMINAL BLOATING 10/25/2008   GERD 06/04/2008   DIVERTICULOSIS OF COLON 06/04/2008    PCP: Eulas Post, MD   REFERRING PROVIDER: Eulas Post, MD   REFERRING DIAG: M54.9 (ICD-10-CM) - Upper back pain on left side  THERAPY DIAG:  Cervicalgia  Acute pain of left shoulder  Abnormal posture  Cramp and spasm  Rationale for Evaluation and Treatment: Rehabilitation  ONSET DATE: one month  SUBJECTIVE:                                                                                                                                                                                                         SUBJECTIVE STATEMENT: Reports left neck pain about a month ago. Went to [facility], but didn't do much hands on. It has improved some. Now having soreness up into neck and top of shoulder. Occasionally feels in chest with band exercises. Sleeping on her shoulder hurts. Neck feels weak. She also reports soreness in her low back and in the morning it hurts.   PERTINENT HISTORY:  L breast CA lumpectomy 2020, OP  PAIN:  Are you having pain? Yes: NPRS scale: 4-5/10 Pain location: left UT, neck and shoulder Pain description: strain, pulling Aggravating factors: turning R>L, sleeping on shoulder Relieving factors: tylenol  PRECAUTIONS: None  WEIGHT BEARING RESTRICTIONS: No  FALLS:  Has patient fallen in last 6 months? No  LIVING ENVIRONMENT: Lives with: lives with their spouse Lives in: House/apartment Stairs: Yes: Internal: 13 steps; on right going up Has following equipment at home: None  OCCUPATION: retired  PLOF: Independent  PATIENT GOALS: normal place of no pain at night or turning her head  NEXT MD VISIT: none scheduled  OBJECTIVE:   DIAGNOSTIC FINDINGS:  Arthritis in neck per pt (xray from [facility].  PATIENT SURVEYS:  FOTO  69 (predicted 47)  COGNITION: Overall cognitive status: Within functional limits for tasks  assessed  SENSATION: WFL  POSTURE: rounded shoulders, forward head, and increased thoracic kyphosis  PALPATION: Palpation: TTP at L UT, LS, lats, subscap, pects. Increased tissue tension in L UT, LS Spinal Mobility:WFL   CERVICAL ROM: *pain  Active ROM A/PROM (deg) eval  Flexion 15*  Extension 10  Right lateral flexion 26*  Left lateral flexion 38  Right rotation 38  Left rotation 27*   (Blank rows = not tested)  UPPER EXTREMITY ROM:  A/P ROM Right eval Left eval  Shoulder flexion  143/150  Shoulder extension  FULL  Shoulder abduction  115/129  Shoulder adduction    Shoulder extension    Shoulder internal rotation  FULL  Shoulder external rotation  55/68   (Blank rows = not tested)  UPPER EXTREMITY MMT: L shoulder ABD 4/5 with pain, else 5/5; L mid trap 4-/5, low trap 4+/5; R mid trap 4+, low trap 4-/5  SHOULDER SPECIAL TESTS: Positive HK, neg Impingement  LUMBAR ROM; WFL - tight HS limiting flexion, tight in rotation bil 25%   TODAY'S TREATMENT:                                                                                                                              DATE:   01/26/23 See pt ed  PATIENT EDUCATION:  Education details: PT eval findings, anticipated POC, initial HEP, and postural awareness  Person educated: Patient Education method: Explanation, Demonstration, and Handouts Education comprehension: verbalized understanding and returned demonstration  HOME EXERCISE PROGRAM: Access Code: [email or web address] URL: [email or web address] Date: 01/26/2023 Prepared by: Almyra Free  Exercises - Supine Chin Tuck  - 1 x daily - 7 x weekly - 3 sets - 10 reps - Single Arm Doorway Pec Stretch at 90 Degrees Abduction  - 2 x daily - 7 x weekly - 1 sets - 3 reps - 30 sec hold - Standing Single Shoulder Flexion Wall Slide with Palm Up  - 2 x daily - 7 x weekly - 1  sets - 10 reps - Seated Gentle Upper Trapezius Stretch (Mirrored)  - 2 x daily - 7 x weekly - 1 sets - 3 reps - 30 sec hold - Gentle Levator Scapulae Stretch  - 2 x daily - 7 x weekly - 1 sets - 3 reps - 30 sec hold  ASSESSMENT:  CLINICAL IMPRESSION: Patient is a 77 y.o. female who was seen today for physical therapy evaluation and treatment for left sided neck and shoulder pain. She had a L lumpectomy in 2020 and her shoulder has been tight ever since. Her neck has been hurting x 1 month and she has pain with turning her head and with flexion. Her shoulder hurts with OH activiities and when sleeping on it. She also reports LBP in the morning. She presents with decreased neck and left shoulder ROM and strength, decreased flexibility in the neck, chest and shoulder muscles and significant posture deficits. She will benefit from skilled PT to address  these deficits. .   OBJECTIVE IMPAIRMENTS: decreased ROM, decreased strength, increased muscle spasms, impaired flexibility, impaired UE functional use, postural dysfunction, and pain.   ACTIVITY LIMITATIONS: sleeping and reach over head  PARTICIPATION LIMITATIONS: driving  PERSONAL FACTORS: Age, Fitness, Time since onset of injury/illness/exacerbation, and 1-2 comorbidities: OP and previous lumpectomy surgery  are also affecting patient's functional outcome.   REHAB POTENTIAL: Good  CLINICAL DECISION MAKING: Stable/uncomplicated  EVALUATION COMPLEXITY: Low   GOALS: Goals reviewed with patient? Yes  SHORT TERM GOALS: Target date: 02/23/2023 (Remove Blue Hyperlink)  Patient will be independent with initial HEP.  Baseline:  Goal status: INITIAL    LONG TERM GOALS: Target date: 03/23/2023  Patient will be independent with advanced/ongoing HEP to improve outcomes and carryover.  Baseline:  Goal status: INITIAL  2.  Patient will report 75% improvement in left neck and shoulder pain to improve QOL.  Baseline:  Goal status: INITIAL  3.   Patient will demonstrate functional pain free cervical ROM for safety with driving.  Baseline:  Goal status: INITIAL  4.  Patient will demonstrate improved posture to decrease muscle imbalance. Baseline:  Goal status: INITIAL  5.  Patient will report 23 on FOTO to demonstrate improved functional ability.  Baseline: 47 Goal status: INITIAL  6.  Patient will demonstrate improved active shoulder flexion to 150 deg or better without pain to ease OH ADLS.   Baseline:  Goal status: INITIAL   7. Patient will be able to sleep on L shoulder without waking from pain. Baseline:  Goal status: INITIAL   PLAN:  PT FREQUENCY: 2x/week  PT DURATION: 8 weeks  PLANNED INTERVENTIONS: Therapeutic exercises, Therapeutic activity, Neuromuscular re-education, Patient/Family education, Self Care, Joint mobilization, Dry Needling, Electrical stimulation, Cryotherapy, Moist heat, Taping, Ionotophoresis '4mg'$ /ml Dexamethasone, and Manual therapy  PLAN FOR NEXT SESSION: Review HEP and progress for postural strengthening, chest opening, work on L shoulder ROM (flex/ER); DN/MT to parascap and neck muscles.   Ante Arredondo, PT 01/26/2023, 10:08 AM

## 2023-01-26 ENCOUNTER — Other Ambulatory Visit: Payer: Self-pay

## 2023-01-26 ENCOUNTER — Ambulatory Visit: Payer: Medicare Other | Attending: Family Medicine | Admitting: Physical Therapy

## 2023-01-26 ENCOUNTER — Encounter: Payer: Self-pay | Admitting: Physical Therapy

## 2023-01-26 DIAGNOSIS — R293 Abnormal posture: Secondary | ICD-10-CM | POA: Insufficient documentation

## 2023-01-26 DIAGNOSIS — M25512 Pain in left shoulder: Secondary | ICD-10-CM | POA: Diagnosis not present

## 2023-01-26 DIAGNOSIS — R252 Cramp and spasm: Secondary | ICD-10-CM | POA: Diagnosis not present

## 2023-01-26 DIAGNOSIS — M542 Cervicalgia: Secondary | ICD-10-CM | POA: Diagnosis not present

## 2023-01-26 DIAGNOSIS — M549 Dorsalgia, unspecified: Secondary | ICD-10-CM | POA: Diagnosis not present

## 2023-01-28 ENCOUNTER — Encounter: Payer: Self-pay | Admitting: Physical Therapy

## 2023-01-28 ENCOUNTER — Ambulatory Visit: Payer: Medicare Other | Admitting: Physical Therapy

## 2023-01-28 DIAGNOSIS — M542 Cervicalgia: Secondary | ICD-10-CM | POA: Diagnosis not present

## 2023-01-28 DIAGNOSIS — M25512 Pain in left shoulder: Secondary | ICD-10-CM

## 2023-01-28 DIAGNOSIS — R293 Abnormal posture: Secondary | ICD-10-CM | POA: Diagnosis not present

## 2023-01-28 DIAGNOSIS — R252 Cramp and spasm: Secondary | ICD-10-CM

## 2023-01-28 DIAGNOSIS — M549 Dorsalgia, unspecified: Secondary | ICD-10-CM | POA: Diagnosis not present

## 2023-01-28 NOTE — Therapy (Signed)
OUTPATIENT PHYSICAL THERAPY TREATMENT NOTE   Patient Name: Shelly [name] MRN: [id number] DOB:[birth year], 77 y.o., female Today's Date: 01/28/2023  PCP: Eulas Post, MD REFERRING PROVIDER: Eulas Post, MD  END OF SESSION:   PT End of Session - 01/28/23 1358     Visit Number 2    Date for PT Re-Evaluation 03/23/23    Authorization Type MCR    Progress Note Due on Visit 10    PT Start Time 1400    PT Stop Time [id number]    PT Time Calculation (min) 45 min    Activity Tolerance Patient tolerated treatment well    Behavior During Therapy [facility] for tasks assessed/performed             Past Medical History:  Diagnosis Date   Allergy    Arthritis    wrist, shoulder    Cancer ([place]) 08/2019   left breast IDC   Diverticulitis    Endometrial polyp    Family history of breast cancer    GERD (gastroesophageal reflux disease)    occasional - diet controlled   Hx of adenomatous colonic polyps    Hyperlipidemia    IBS (irritable bowel syndrome)    Missed abortion    x 1 - resolved - no surgery   Osteoporosis    SVD (spontaneous vaginal delivery)    x 1   Transient vision disturbance 2019   was evaluated by ED, no findings   Vitamin D deficiency    Past Surgical History:  Procedure Laterality Date   BREAST EXCISIONAL BIOPSY Right 08/2019   Benign, lesion    BREAST LUMPECTOMY Left 09/2019   BREAST LUMPECTOMY WITH RADIOACTIVE SEED AND SENTINEL LYMPH NODE BIOPSY Left 10/06/2019   Procedure: LEFT BREAST LUMPECTOMY  X2 WITH RADIOACTIVE SEED X2  AND SENTINEL LYMPH NODE BIOPSY;  Surgeon: Jovita Kussmaul, MD;  Location: [place];  Service: General;  Laterality: Left;   BREAST LUMPECTOMY WITH RADIOACTIVE SEED LOCALIZATION Right 10/06/2019   Procedure: RIGHT BREAST LUMPECTOMY WITH RADIOACTIVE SEED LOCALIZATION;  Surgeon: Jovita Kussmaul, MD;  Location: [place];  Service: General;  Laterality: Right;   BRONCHIAL BIOPSY  12/27/2020    Procedure: BRONCHIAL BIOPSIES;  Surgeon: Garner Nash, DO;  Location: [place] [facility];  Service: Pulmonary;;   BRONCHIAL BRUSHINGS  12/27/2020   Procedure: BRONCHIAL BRUSHINGS;  Surgeon: Garner Nash, DO;  Location: [place] [facility];  Service: Pulmonary;;   BRONCHIAL NEEDLE ASPIRATION BIOPSY  12/27/2020   Procedure: BRONCHIAL NEEDLE ASPIRATION BIOPSIES;  Surgeon: Garner Nash, DO;  Location: [place] [facility];  Service: Pulmonary;;   BRONCHIAL WASHINGS  12/27/2020   Procedure: BRONCHIAL WASHINGS;  Surgeon: Garner Nash, DO;  Location: [place] [facility];  Service: Pulmonary;;   COLONOSCOPY     DIAGNOSTIC LAPAROSCOPY     cysts   DILATATION & CURETTAGE/HYSTEROSCOPY WITH TRUECLEAR N/A 10/31/2013   Procedure: DILATATION & CURETTAGE/HYSTEROSCOPY WITH TRUCLEAR;  Surgeon: Marylynn Pearson, MD;  Location: [place] [facility];  Service: Gynecology;  Laterality: N/A;   DILATION AND CURETTAGE OF UTERUS     MASS EXCISION Left 11/06/2019   Procedure: EXCISION NIPPLE AND AREOLA  MARGIN LEFT BREAST;  Surgeon: Jovita Kussmaul, MD;  Location: [place];  Service: General;  Laterality: Left;   TONSILLECTOMY  1952   VIDEO BRONCHOSCOPY WITH ENDOBRONCHIAL NAVIGATION Bilateral 12/27/2020   Procedure: VIDEO BRONCHOSCOPY WITH ENDOBRONCHIAL NAVIGATION;  Surgeon: Garner Nash, DO;  Location: [place];  Service: Pulmonary;  Laterality:  Bilateral;   VIDEO BRONCHOSCOPY WITH ENDOBRONCHIAL ULTRASOUND Bilateral 12/27/2020   Procedure: VIDEO BRONCHOSCOPY WITH ENDOBRONCHIAL ULTRASOUND;  Surgeon: Garner Nash, DO;  Location: [place];  Service: Pulmonary;  Laterality: Bilateral;   WISDOM TOOTH EXTRACTION     Patient Active Problem List   Diagnosis Date Noted   Urinary frequency 04/12/2022   Acute bilateral low back pain without sciatica 04/12/2022   Essential hypertension 01/14/2022   Lung mass    Lesion of lung 12/04/2020   History of adenomatous polyp of colon 08/14/2020   Family history of breast cancer     Cancer of central portion of left female breast ([place]) 09/18/2019   Prediabetes 04/06/2017   Osteoporosis 06/12/2015   IBS (irritable bowel syndrome) 08/21/2014   Hyperlipidemia 08/05/2011   ABDOMINAL PAIN RIGHT UPPER QUADRANT 10/30/2008   ABDOMINAL BLOATING 10/25/2008   GERD 06/04/2008   DIVERTICULOSIS OF COLON 06/04/2008    REFERRING DIAG: M54.9 (ICD-10-CM) - Upper back pain on left side  THERAPY DIAG:  Cervicalgia  Acute pain of left shoulder  Abnormal posture  Cramp and spasm  Rationale for Evaluation and Treatment Rehabilitation  PERTINENT HISTORY: L breast CA lumpectomy 2020, OP  PRECAUTIONS: osteoporosis  SUBJECTIVE:                                                                                                                                                                                      SUBJECTIVE STATEMENT:  I am feeling better now compared to when I was at the evaluation.  I am moving better.  I feel it mostly at night when I lay on my Lt side.    PAIN:  Are you having pain? Yes: NPRS scale: 4-5/10 Pain location: left UT, neck and shoulder Pain description: strain, pulling Aggravating factors: turning R>L, sleeping on shoulder Relieving factors: tylenol   OBJECTIVE: (objective measures completed at initial evaluation unless otherwise dated)   DIAGNOSTIC FINDINGS:  Arthritis in neck per pt (xray from [facility].   PATIENT SURVEYS:  FOTO 42 (predicted 60)   COGNITION: Overall cognitive status: Within functional limits for tasks assessed   SENSATION: WFL   POSTURE: rounded shoulders, forward head, and increased thoracic kyphosis   PALPATION: Palpation: TTP at L UT, LS, lats, subscap, pects. Increased tissue tension in L UT, LS Spinal Mobility:WFL     CERVICAL ROM: *pain   Active ROM A/PROM (deg) eval  Flexion 15*  Extension 10  Right lateral flexion 26*  Left lateral flexion 38  Right rotation 38  Left rotation 27*   (Blank rows  = not tested)   UPPER EXTREMITY ROM:   A/P ROM Right eval Left eval  Shoulder flexion   143/150  Shoulder extension   FULL  Shoulder abduction   115/129  Shoulder adduction      Shoulder extension      Shoulder internal rotation   FULL  Shoulder external rotation   55/68   (Blank rows = not tested)   UPPER EXTREMITY MMT: L shoulder ABD 4/5 with pain, else 5/5; L mid trap 4-/5, low trap 4+/5; R mid trap 4+, low trap 4-/5   SHOULDER SPECIAL TESTS: Positive HK, neg Impingement   LUMBAR ROM; WFL - tight HS limiting flexion, tight in rotation bil 25%     TODAY'S TREATMENT:                                                                                                                              DATE:   01/28/23: NuStep L3 x 3' PT present to discuss plan for session Lt wall slide shoulder flexion x 10 Lt pec stretch in doorway 2x30" Standing bil UE row and ext red tband 1x10 each Seated levator and upper trap stretch with light overpressure x30" each, bil Standing horiz abd and ER bil UE red tband 1x10 each - very fatigued with horiz abd Open book laying on Rt side opening into Lt rotation x10 - great stretch through Lt pec Seated Lt ER AA/ROM and end range stretch with dowel x10  01/26/23 See pt ed   PATIENT EDUCATION:  Education details: PT eval findings, anticipated POC, initial HEP, and postural awareness   Person educated: Patient Education method: Explanation, Demonstration, and Handouts Education comprehension: verbalized understanding and returned demonstration   HOME EXERCISE PROGRAM: Access Code: [email or web address] URL: [email or web address] Date: 01/28/2023 Prepared by: Venetia Night Vickye Astorino  Exercises - Supine Chin Tuck  - 1 x daily - 7 x weekly - 3 sets - 10 reps - Single Arm Doorway Pec Stretch at 90 Degrees Abduction  - 2 x daily - 7 x weekly - 1 sets - 3 reps - 30 sec hold - Standing Single Shoulder Flexion Wall Slide with Palm Up  - 2 x daily - 7 x weekly  - 1 sets - 10 reps - Seated Gentle Upper Trapezius Stretch (Mirrored)  - 2 x daily - 7 x weekly - 1 sets - 3 reps - 30 sec hold - Gentle Levator Scapulae Stretch  - 2 x daily - 7 x weekly - 1 sets - 3 reps - 30 sec hold - Standing Row with Anchored Resistance  - 1 x daily - 7 x weekly - 2 sets - 10 reps - Shoulder extension with resistance - Neutral  - 1 x daily - 7 x weekly - 2 sets - 10 reps - Supine Shoulder Horizontal Abduction with Resistance  - 1 x daily - 7 x weekly - 2 sets - 10 reps - Standing Shoulder Horizontal Abduction with Resistance  - 1 x daily - 7 x weekly - 2 sets - 10 reps - Shoulder  External Rotation and Scapular Retraction with Resistance  - 1 x daily - 7 x weekly - 2 sets - 10 reps - Sidelying Thoracic Rotation with Open Book  - 1 x daily - 7 x weekly - 2 sets - 10 reps - Seated Shoulder External Rotation AAROM with Cane and Hand in Neutral  - 1 x daily - 7 x weekly - 2 sets - 10 reps   ASSESSMENT:   CLINICAL IMPRESSION: Pt presents for first time follow up.  She requested 2 visits of therex before doing any hands on to see how she does.  PT progressed stretches and initiated postural exercise which were well tolerated.  Pt reported feeling looser and like her shoulders were more relaxed end of session.  She may need to use yellow tband for horiz abd as this was very fatiguing today.  Pt not comfortable laying on back so stayed seated and standing for therex.  Progressed HEP and gave handouts end of session.     OBJECTIVE IMPAIRMENTS: decreased ROM, decreased strength, increased muscle spasms, impaired flexibility, impaired UE functional use, postural dysfunction, and pain.    ACTIVITY LIMITATIONS: sleeping and reach over head   PARTICIPATION LIMITATIONS: driving   PERSONAL FACTORS: Age, Fitness, Time since onset of injury/illness/exacerbation, and 1-2 comorbidities: OP and previous lumpectomy surgery  are also affecting patient's functional outcome.    REHAB POTENTIAL:  Good   CLINICAL DECISION MAKING: Stable/uncomplicated   EVALUATION COMPLEXITY: Low     GOALS: Goals reviewed with patient? Yes   SHORT TERM GOALS: Target date: 02/23/2023 (Remove Blue Hyperlink)   Patient will be independent with initial HEP.  Baseline:  Goal status: ongoing       LONG TERM GOALS: Target date: 03/23/2023  Patient will be independent with advanced/ongoing HEP to improve outcomes and carryover.  Baseline:  Goal status: INITIAL   2.  Patient will report 75% improvement in left neck and shoulder pain to improve QOL.  Baseline:  Goal status: INITIAL   3.  Patient will demonstrate functional pain free cervical ROM for safety with driving.  Baseline:  Goal status: INITIAL   4.  Patient will demonstrate improved posture to decrease muscle imbalance. Baseline:  Goal status: INITIAL   5.  Patient will report 49 on FOTO to demonstrate improved functional ability.  Baseline: 47 Goal status: INITIAL   6.  Patient will demonstrate improved active shoulder flexion to 150 deg or better without pain to ease OH ADLS.   Baseline:  Goal status: INITIAL    7. Patient will be able to sleep on L shoulder without waking from pain. Baseline:  Goal status: INITIAL     PLAN:   PT FREQUENCY: 2x/week   PT DURATION: 8 weeks   PLANNED INTERVENTIONS: Therapeutic exercises, Therapeutic activity, Neuromuscular re-education, Patient/Family education, Self Care, Joint mobilization, Dry Needling, Electrical stimulation, Cryotherapy, Moist heat, Taping, Ionotophoresis '4mg'$ /ml Dexamethasone, and Manual therapy   PLAN FOR NEXT SESSION: Pt does not lay on back, wants to try therex before manual PT to see how she does with exercise approach, Review HEP and progress for postural strengthening, chest opening, work on L shoulder ROM (flex/ER); DN/MT to parascap and neck muscles.     Antoney Biven, PT 01/28/23 4:11 PM

## 2023-02-01 ENCOUNTER — Ambulatory Visit: Payer: Medicare Other

## 2023-02-01 DIAGNOSIS — M542 Cervicalgia: Secondary | ICD-10-CM | POA: Diagnosis not present

## 2023-02-01 DIAGNOSIS — M25512 Pain in left shoulder: Secondary | ICD-10-CM

## 2023-02-01 DIAGNOSIS — R252 Cramp and spasm: Secondary | ICD-10-CM | POA: Diagnosis not present

## 2023-02-01 DIAGNOSIS — R293 Abnormal posture: Secondary | ICD-10-CM | POA: Diagnosis not present

## 2023-02-01 DIAGNOSIS — M549 Dorsalgia, unspecified: Secondary | ICD-10-CM | POA: Diagnosis not present

## 2023-02-01 NOTE — Therapy (Signed)
OUTPATIENT PHYSICAL THERAPY TREATMENT NOTE   Patient Name: Shelly [name] MRN: [id number] DOB:[birth year], 77 y.o., female Today's Date: 02/01/2023  PCP: Eulas Post, MD REFERRING PROVIDER: Eulas Post, MD  END OF SESSION:   PT End of Session - 02/01/23 1440     Visit Number 3    Date for PT Re-Evaluation 03/23/23    Authorization Type MCR    Progress Note Due on Visit 10    PT Start Time 1401    PT Stop Time 1441    PT Time Calculation (min) 40 min    Activity Tolerance Patient tolerated treatment well    Behavior During Therapy WFL for tasks assessed/performed              Past Medical History:  Diagnosis Date   Allergy    Arthritis    wrist, shoulder    Cancer ([place]) 08/2019   left breast IDC   Diverticulitis    Endometrial polyp    Family history of breast cancer    GERD (gastroesophageal reflux disease)    occasional - diet controlled   Hx of adenomatous colonic polyps    Hyperlipidemia    IBS (irritable bowel syndrome)    Missed abortion    x 1 - resolved - no surgery   Osteoporosis    SVD (spontaneous vaginal delivery)    x 1   Transient vision disturbance 2019   was evaluated by ED, no findings   Vitamin D deficiency    Past Surgical History:  Procedure Laterality Date   BREAST EXCISIONAL BIOPSY Right 08/2019   Benign, lesion    BREAST LUMPECTOMY Left 09/2019   BREAST LUMPECTOMY WITH RADIOACTIVE SEED AND SENTINEL LYMPH NODE BIOPSY Left 10/06/2019   Procedure: LEFT BREAST LUMPECTOMY  X2 WITH RADIOACTIVE SEED X2  AND SENTINEL LYMPH NODE BIOPSY;  Surgeon: Jovita Kussmaul, MD;  Location: [place];  Service: General;  Laterality: Left;   BREAST LUMPECTOMY WITH RADIOACTIVE SEED LOCALIZATION Right 10/06/2019   Procedure: RIGHT BREAST LUMPECTOMY WITH RADIOACTIVE SEED LOCALIZATION;  Surgeon: Jovita Kussmaul, MD;  Location: [place];  Service: General;  Laterality: Right;   BRONCHIAL BIOPSY  12/27/2020    Procedure: BRONCHIAL BIOPSIES;  Surgeon: Garner Nash, DO;  Location: [place] [facility];  Service: Pulmonary;;   BRONCHIAL BRUSHINGS  12/27/2020   Procedure: BRONCHIAL BRUSHINGS;  Surgeon: Garner Nash, DO;  Location: [facility] [facility];  Service: Pulmonary;;   BRONCHIAL NEEDLE ASPIRATION BIOPSY  12/27/2020   Procedure: BRONCHIAL NEEDLE ASPIRATION BIOPSIES;  Surgeon: Garner Nash, DO;  Location: [place] [facility];  Service: Pulmonary;;   BRONCHIAL WASHINGS  12/27/2020   Procedure: BRONCHIAL WASHINGS;  Surgeon: Garner Nash, DO;  Location: [facility];  Service: Pulmonary;;   COLONOSCOPY     DIAGNOSTIC LAPAROSCOPY     cysts   DILATATION & CURETTAGE/HYSTEROSCOPY WITH TRUECLEAR N/A 10/31/2013   Procedure: DILATATION & CURETTAGE/HYSTEROSCOPY WITH TRUCLEAR;  Surgeon: Marylynn Pearson, MD;  Location: [facility];  Service: Gynecology;  Laterality: N/A;   DILATION AND CURETTAGE OF UTERUS     MASS EXCISION Left 11/06/2019   Procedure: EXCISION NIPPLE AND AREOLA  MARGIN LEFT BREAST;  Surgeon: Jovita Kussmaul, MD;  Location: [place];  Service: General;  Laterality: Left;   TONSILLECTOMY  1952   VIDEO BRONCHOSCOPY WITH ENDOBRONCHIAL NAVIGATION Bilateral 12/27/2020   Procedure: VIDEO BRONCHOSCOPY WITH ENDOBRONCHIAL NAVIGATION;  Surgeon: Garner Nash, DO;  Location: [place];  Service: Pulmonary;  Laterality: Bilateral;   VIDEO BRONCHOSCOPY WITH ENDOBRONCHIAL ULTRASOUND Bilateral 12/27/2020   Procedure: VIDEO BRONCHOSCOPY WITH ENDOBRONCHIAL ULTRASOUND;  Surgeon: Garner Nash, DO;  Location: [place];  Service: Pulmonary;  Laterality: Bilateral;   WISDOM TOOTH EXTRACTION     Patient Active Problem List   Diagnosis Date Noted   Urinary frequency 04/12/2022   Acute bilateral low back pain without sciatica 04/12/2022   Essential hypertension 01/14/2022   Lung mass    Lesion of lung 12/04/2020   History of adenomatous polyp of colon 08/14/2020   Family history of breast cancer     Cancer of central portion of left female breast ([place]) 09/18/2019   Prediabetes 04/06/2017   Osteoporosis 06/12/2015   IBS (irritable bowel syndrome) 08/21/2014   Hyperlipidemia 08/05/2011   ABDOMINAL PAIN RIGHT UPPER QUADRANT 10/30/2008   ABDOMINAL BLOATING 10/25/2008   GERD 06/04/2008   DIVERTICULOSIS OF COLON 06/04/2008    REFERRING DIAG: M54.9 (ICD-10-CM) - Upper back pain on left side  THERAPY DIAG:  Cervicalgia  Acute pain of left shoulder  Abnormal posture  Cramp and spasm  Rationale for Evaluation and Treatment Rehabilitation  PERTINENT HISTORY: L breast CA lumpectomy 2020, OP  PRECAUTIONS: osteoporosis  SUBJECTIVE:                                                                                                                                                                                      SUBJECTIVE STATEMENT:  I'm doing ok.  My neck hurts intermittently, more on the Lt. I'm 15% better since I started PT.    PAIN:  Are you having pain? Yes: NPRS scale: 5/10 Pain location: left UT, neck and shoulder Pain description: strain, pulling Aggravating factors: turning R>L, sleeping on shoulder Relieving factors: tylenol   OBJECTIVE:    DIAGNOSTIC FINDINGS:  Arthritis in neck per pt (xray from [facility].   PATIENT SURVEYS:  FOTO 51 (predicted 60)   COGNITION: Overall cognitive status: Within functional limits for tasks assessed   SENSATION: WFL   POSTURE: rounded shoulders, forward head, and increased thoracic kyphosis   PALPATION: Palpation: TTP at L UT, LS, lats, subscap, pects. Increased tissue tension in L UT, LS Spinal Mobility:WFL     CERVICAL ROM: *pain   Active ROM A/PROM (deg) eval  Flexion 15*  Extension 10  Right lateral flexion 26*  Left lateral flexion 38  Right rotation 38  Left rotation 27*   (Blank rows = not tested)   UPPER EXTREMITY ROM:   A/P ROM Right eval Left eval  Shoulder flexion   143/150  Shoulder  extension   FULL  Shoulder abduction   115/129  Shoulder  adduction      Shoulder extension      Shoulder internal rotation   FULL  Shoulder external rotation   55/68   (Blank rows = not tested)   UPPER EXTREMITY MMT: L shoulder ABD 4/5 with pain, else 5/5; L mid trap 4-/5, low trap 4+/5; R mid trap 4+, low trap 4-/5   SHOULDER SPECIAL TESTS: Positive HK, neg Impingement   LUMBAR ROM; WFL - tight HS limiting flexion, tight in rotation bil 25%     TODAY'S TREATMENT:                                                                                                                              DATE:   02/01/23: NuStep L3 x 5' PT present to discuss plan for session Lt wall slide shoulder flexion x 10 Lt pec stretch in doorway 2x30" Standing bil UE row and ext red tband 1x15 each Seated levator and upper trap stretch with light overpressure x30" each, bil- moderate tactile cues for alignment Standing horiz abd and ER bil UE yellow theraband: horizontal abduction x10, ER 2x5 Open book laying on Rt side opening into Lt rotation x10 - great stretch through Lt pec Manual: addaday to Lt upper trap, scapula   01/28/23: NuStep L3 x 3' PT present to discuss plan for session Lt wall slide shoulder flexion x 10 Lt pec stretch in doorway 2x30" Standing bil UE row and ext red tband 1x10 each Seated levator and upper trap stretch with light overpressure x30" each, bil Standing horiz abd and ER bil UE red tband 1x10 each - very fatigued with horiz abd Open book laying on Rt side opening into Lt rotation x10 - great stretch through Lt pec Seated Lt ER AA/ROM and end range stretch with dowel x10  01/26/23 See pt ed   PATIENT EDUCATION:  Education details: PT eval findings, anticipated POC, initial HEP, and postural awareness   Person educated: Patient Education method: Explanation, Demonstration, and Handouts Education comprehension: verbalized understanding and returned demonstration   HOME  EXERCISE PROGRAM: Access Code: [email or web address] URL: [email or web address] Date: 01/28/2023 Prepared by: Venetia Night Beuhring  Exercises - Supine Chin Tuck  - 1 x daily - 7 x weekly - 3 sets - 10 reps - Single Arm Doorway Pec Stretch at 90 Degrees Abduction  - 2 x daily - 7 x weekly - 1 sets - 3 reps - 30 sec hold - Standing Single Shoulder Flexion Wall Slide with Palm Up  - 2 x daily - 7 x weekly - 1 sets - 10 reps - Seated Gentle Upper Trapezius Stretch (Mirrored)  - 2 x daily - 7 x weekly - 1 sets - 3 reps - 30 sec hold - Gentle Levator Scapulae Stretch  - 2 x daily - 7 x weekly - 1 sets - 3 reps - 30 sec hold - Standing Row with Anchored Resistance  - 1 x daily - 7 x weekly -  2 sets - 10 reps - Shoulder extension with resistance - Neutral  - 1 x daily - 7 x weekly - 2 sets - 10 reps - Supine Shoulder Horizontal Abduction with Resistance  - 1 x daily - 7 x weekly - 2 sets - 10 reps - Standing Shoulder Horizontal Abduction with Resistance  - 1 x daily - 7 x weekly - 2 sets - 10 reps - Shoulder External Rotation and Scapular Retraction with Resistance  - 1 x daily - 7 x weekly - 2 sets - 10 reps - Sidelying Thoracic Rotation with Open Book  - 1 x daily - 7 x weekly - 2 sets - 10 reps - Seated Shoulder External Rotation AAROM with Cane and Hand in Neutral  - 1 x daily - 7 x weekly - 2 sets - 10 reps   ASSESSMENT:   CLINICAL IMPRESSION: Pt reports 15% overall improvement since the start of care.  She wanted to review HEP to make sure she was doing it correctly. PT provided verbal and tactile cues for technique with exercise in the clinic today.  She tends to activate upper traps with UE movement.  Pt reports that she sits a lot to watch TV.  PT educated to take rest breaks to break up long stretches of sitting.  Pt is working on postural corrections and scapular depression with daily tasks.   Patient will benefit from skilled PT to address the below impairments and improve overall function.     OBJECTIVE IMPAIRMENTS: decreased ROM, decreased strength, increased muscle spasms, impaired flexibility, impaired UE functional use, postural dysfunction, and pain.    ACTIVITY LIMITATIONS: sleeping and reach over head   PARTICIPATION LIMITATIONS: driving   PERSONAL FACTORS: Age, Fitness, Time since onset of injury/illness/exacerbation, and 1-2 comorbidities: OP and previous lumpectomy surgery  are also affecting patient's functional outcome.    REHAB POTENTIAL: Good   CLINICAL DECISION MAKING: Stable/uncomplicated   EVALUATION COMPLEXITY: Low     GOALS: Goals reviewed with patient? Yes   SHORT TERM GOALS: Target date: 02/23/2023    Patient will be independent with initial HEP.  Baseline:  Goal status: MET (02/01/23)       LONG TERM GOALS: Target date: 03/23/2023  Patient will be independent with advanced/ongoing HEP to improve outcomes and carryover.  Baseline:  Goal status: INITIAL   2.  Patient will report 75% improvement in left neck and shoulder pain to improve QOL.  Baseline: 15% (02/01/23) Goal status: in progress    3.  Patient will demonstrate functional pain free cervical ROM for safety with driving.  Baseline:  Goal status: INITIAL   4.  Patient will demonstrate improved posture to decrease muscle imbalance. Baseline:  Goal status: INITIAL   5.  Patient will report 34 on FOTO to demonstrate improved functional ability.  Baseline: 47 Goal status: INITIAL   6.  Patient will demonstrate improved active shoulder flexion to 150 deg or better without pain to ease OH ADLS.   Baseline:  Goal status: INITIAL    7. Patient will be able to sleep on L shoulder without waking from pain. Baseline:  Goal status: INITIAL     PLAN:   PT FREQUENCY: 2x/week   PT DURATION: 8 weeks   PLANNED INTERVENTIONS: Therapeutic exercises, Therapeutic activity, Neuromuscular re-education, Patient/Family education, Self Care, Joint mobilization, Dry Needling, Electrical  stimulation, Cryotherapy, Moist heat, Taping, Ionotophoresis 4mg /ml Dexamethasone, and Manual therapy   PLAN FOR NEXT SESSION: Consider DN next session, see how she  did after Addaday mobilization Sigurd Sos, PT 02/01/23 2:42 PM

## 2023-02-02 ENCOUNTER — Encounter: Payer: Self-pay | Admitting: Family Medicine

## 2023-02-03 ENCOUNTER — Ambulatory Visit: Payer: Medicare Other

## 2023-02-03 DIAGNOSIS — M542 Cervicalgia: Secondary | ICD-10-CM | POA: Diagnosis not present

## 2023-02-03 DIAGNOSIS — R293 Abnormal posture: Secondary | ICD-10-CM

## 2023-02-03 DIAGNOSIS — M549 Dorsalgia, unspecified: Secondary | ICD-10-CM | POA: Diagnosis not present

## 2023-02-03 DIAGNOSIS — R252 Cramp and spasm: Secondary | ICD-10-CM

## 2023-02-03 DIAGNOSIS — M25512 Pain in left shoulder: Secondary | ICD-10-CM | POA: Diagnosis not present

## 2023-02-03 NOTE — Therapy (Signed)
OUTPATIENT PHYSICAL THERAPY TREATMENT NOTE   Patient Name: Shelly [name] MRN: [id number] DOB:January 22, 1946, 77 y.o., female Today's Date: 02/03/2023  PCP: Eulas Post, MD REFERRING PROVIDER: Eulas Post, MD  END OF SESSION:   PT End of Session - 02/03/23 1440     Visit Number 4    Date for PT Re-Evaluation 03/23/23    Authorization Type MCR    Progress Note Due on Visit 10    PT Start Time [id number]    PT Stop Time 1440    PT Time Calculation (min) 38 min    Activity Tolerance Patient tolerated treatment well    Behavior During Therapy WFL for tasks assessed/performed               Past Medical History:  Diagnosis Date   Allergy    Arthritis    wrist, shoulder    Cancer ([place]) 08/2019   left breast IDC   Diverticulitis    Endometrial polyp    Family history of breast cancer    GERD (gastroesophageal reflux disease)    occasional - diet controlled   Hx of adenomatous colonic polyps    Hyperlipidemia    IBS (irritable bowel syndrome)    Missed abortion    x 1 - resolved - no surgery   Osteoporosis    SVD (spontaneous vaginal delivery)    x 1   Transient vision disturbance 2019   was evaluated by ED, no findings   Vitamin D deficiency    Past Surgical History:  Procedure Laterality Date   BREAST EXCISIONAL BIOPSY Right 08/2019   Benign, lesion    BREAST LUMPECTOMY Left 09/2019   BREAST LUMPECTOMY WITH RADIOACTIVE SEED AND SENTINEL LYMPH NODE BIOPSY Left 10/06/2019   Procedure: LEFT BREAST LUMPECTOMY  X2 WITH RADIOACTIVE SEED X2  AND SENTINEL LYMPH NODE BIOPSY;  Surgeon: Jovita Kussmaul, MD;  Location: [place];  Service: General;  Laterality: Left;   BREAST LUMPECTOMY WITH RADIOACTIVE SEED LOCALIZATION Right 10/06/2019   Procedure: RIGHT BREAST LUMPECTOMY WITH RADIOACTIVE SEED LOCALIZATION;  Surgeon: Jovita Kussmaul, MD;  Location: [place];  Service: General;  Laterality: Right;   BRONCHIAL BIOPSY  12/27/2020    Procedure: BRONCHIAL BIOPSIES;  Surgeon: Garner Nash, DO;  Location: [place] [facility];  Service: Pulmonary;;   BRONCHIAL BRUSHINGS  12/27/2020   Procedure: BRONCHIAL BRUSHINGS;  Surgeon: Garner Nash, DO;  Location: [place] [facility];  Service: Pulmonary;;   BRONCHIAL NEEDLE ASPIRATION BIOPSY  12/27/2020   Procedure: BRONCHIAL NEEDLE ASPIRATION BIOPSIES;  Surgeon: Garner Nash, DO;  Location: [place] [facility];  Service: Pulmonary;;   BRONCHIAL WASHINGS  12/27/2020   Procedure: BRONCHIAL WASHINGS;  Surgeon: Garner Nash, DO;  Location: [place] [facility];  Service: Pulmonary;;   COLONOSCOPY     DIAGNOSTIC LAPAROSCOPY     cysts   DILATATION & CURETTAGE/HYSTEROSCOPY WITH TRUECLEAR N/A 10/31/2013   Procedure: DILATATION & CURETTAGE/HYSTEROSCOPY WITH TRUCLEAR;  Surgeon: Marylynn Pearson, MD;  Location: [facility];  Service: Gynecology;  Laterality: N/A;   DILATION AND CURETTAGE OF UTERUS     MASS EXCISION Left 11/06/2019   Procedure: EXCISION NIPPLE AND AREOLA  MARGIN LEFT BREAST;  Surgeon: Jovita Kussmaul, MD;  Location: [place];  Service: General;  Laterality: Left;   TONSILLECTOMY  1952   VIDEO BRONCHOSCOPY WITH ENDOBRONCHIAL NAVIGATION Bilateral 12/27/2020   Procedure: VIDEO BRONCHOSCOPY WITH ENDOBRONCHIAL NAVIGATION;  Surgeon: Garner Nash, DO;  Location: [place];  Service: Pulmonary;  Laterality: Bilateral;   VIDEO BRONCHOSCOPY WITH ENDOBRONCHIAL ULTRASOUND Bilateral 12/27/2020   Procedure: VIDEO BRONCHOSCOPY WITH ENDOBRONCHIAL ULTRASOUND;  Surgeon: Garner Nash, DO;  Location: [place];  Service: Pulmonary;  Laterality: Bilateral;   WISDOM TOOTH EXTRACTION     Patient Active Problem List   Diagnosis Date Noted   Urinary frequency 04/12/2022   Acute bilateral low back pain without sciatica 04/12/2022   Essential hypertension 01/14/2022   Lung mass    Lesion of lung 12/04/2020   History of adenomatous polyp of colon 08/14/2020   Family history of breast cancer     Cancer of central portion of left female breast ([place]) 09/18/2019   Prediabetes 04/06/2017   Osteoporosis 06/12/2015   IBS (irritable bowel syndrome) 08/21/2014   Hyperlipidemia 08/05/2011   ABDOMINAL PAIN RIGHT UPPER QUADRANT 10/30/2008   ABDOMINAL BLOATING 10/25/2008   GERD 06/04/2008   DIVERTICULOSIS OF COLON 06/04/2008    REFERRING DIAG: M54.9 (ICD-10-CM) - Upper back pain on left side  THERAPY DIAG:  Cervicalgia  Acute pain of left shoulder  Abnormal posture  Cramp and spasm  Rationale for Evaluation and Treatment Rehabilitation  PERTINENT HISTORY: L breast CA lumpectomy 2020, OP  PRECAUTIONS: osteoporosis  SUBJECTIVE:                                                                                                                                                                                      SUBJECTIVE STATEMENT:  I've had a catch in my neck/back the past couple of days.  I have brief pain that is 5/10 and then it stops.     PAIN:  Are you having pain? Yes: NPRS scale: 0-5/10 Pain location: left UT, neck and shoulder Pain description: strain, pulling Aggravating factors: turning R>L, sleeping on shoulder Relieving factors: tylenol   OBJECTIVE:    DIAGNOSTIC FINDINGS:  Arthritis in neck per pt (xray from [facility].   PATIENT SURVEYS:  FOTO 6 (predicted 60)   COGNITION: Overall cognitive status: Within functional limits for tasks assessed   SENSATION: WFL   POSTURE: rounded shoulders, forward head, and increased thoracic kyphosis   PALPATION: Palpation: TTP at L UT, LS, lats, subscap, pects. Increased tissue tension in L UT, LS Spinal Mobility:WFL     CERVICAL ROM: *pain   Active ROM A/PROM (deg) eval  Flexion 15*  Extension 10  Right lateral flexion 26*  Left lateral flexion 38  Right rotation 38  Left rotation 27*   (Blank rows = not tested)   UPPER EXTREMITY ROM:   A/P ROM Right eval Left eval  Shoulder flexion   143/150   Shoulder extension   FULL  Shoulder  abduction   115/129  Shoulder adduction      Shoulder extension      Shoulder internal rotation   FULL  Shoulder external rotation   55/68   (Blank rows = not tested)   UPPER EXTREMITY MMT: L shoulder ABD 4/5 with pain, else 5/5; L mid trap 4-/5, low trap 4+/5; R mid trap 4+, low trap 4-/5   SHOULDER SPECIAL TESTS: Positive HK, neg Impingement   LUMBAR ROM; WFL - tight HS limiting flexion, tight in rotation bil 25%     TODAY'S TREATMENT:                                                                                                                              DATE:   02/03/23: NuStep L3 x 5' PT present to discuss plan for session Lt wall slide shoulder flexion x 10 Lt pec stretch in doorway 2x30" Standing bil UE row and ext red tband 1x15 each Ball roll outs: forward and lateral x5 each with 10" hold  Standing horiz abd and ER bil UE yellow theraband: horizontal abduction 2x10, ER 2x5 Manual: elongation to bil upper traps and neck in sitting  02/01/23: NuStep L3 x 5' PT present to discuss plan for session Lt wall slide shoulder flexion x 10 Lt pec stretch in doorway 2x30" Standing bil UE row and ext red tband 1x15 each Seated levator and upper trap stretch with light overpressure x30" each, bil- moderate tactile cues for alignment Standing horiz abd and ER bil UE yellow theraband: horizontal abduction x10, ER 2x5 Open book laying on Rt side opening into Lt rotation x10 - great stretch through Lt pec Manual: addaday to Lt upper trap, scapula   01/28/23: NuStep L3 x 3' PT present to discuss plan for session Lt wall slide shoulder flexion x 10 Lt pec stretch in doorway 2x30" Standing bil UE row and ext red tband 1x10 each Seated levator and upper trap stretch with light overpressure x30" each, bil Standing horiz abd and ER bil UE red tband 1x10 each - very fatigued with horiz abd Open book laying on Rt side opening into Lt rotation x10 -  great stretch through Lt pec Seated Lt ER AA/ROM and end range stretch with dowel x10  01/26/23 See pt ed   PATIENT EDUCATION:  Education details: PT eval findings, anticipated POC, initial HEP, and postural awareness   Person educated: Patient Education method: Explanation, Demonstration, and Handouts Education comprehension: verbalized understanding and returned demonstration   HOME EXERCISE PROGRAM: Access Code: [email or web address] URL: [email or web address] Date: 01/28/2023 Prepared by: Venetia Night Beuhring  Exercises - Supine Chin Tuck  - 1 x daily - 7 x weekly - 3 sets - 10 reps - Single Arm Doorway Pec Stretch at 90 Degrees Abduction  - 2 x daily - 7 x weekly - 1 sets - 3 reps - 30 sec hold - Standing Single Shoulder Flexion Wall Slide with Palm Up  - 2  x daily - 7 x weekly - 1 sets - 10 reps - Seated Gentle Upper Trapezius Stretch (Mirrored)  - 2 x daily - 7 x weekly - 1 sets - 3 reps - 30 sec hold - Gentle Levator Scapulae Stretch  - 2 x daily - 7 x weekly - 1 sets - 3 reps - 30 sec hold - Standing Row with Anchored Resistance  - 1 x daily - 7 x weekly - 2 sets - 10 reps - Shoulder extension with resistance - Neutral  - 1 x daily - 7 x weekly - 2 sets - 10 reps - Supine Shoulder Horizontal Abduction with Resistance  - 1 x daily - 7 x weekly - 2 sets - 10 reps - Standing Shoulder Horizontal Abduction with Resistance  - 1 x daily - 7 x weekly - 2 sets - 10 reps - Shoulder External Rotation and Scapular Retraction with Resistance  - 1 x daily - 7 x weekly - 2 sets - 10 reps - Sidelying Thoracic Rotation with Open Book  - 1 x daily - 7 x weekly - 2 sets - 10 reps - Seated Shoulder External Rotation AAROM with Cane and Hand in Neutral  - 1 x daily - 7 x weekly - 2 sets - 10 reps   ASSESSMENT:   CLINICAL IMPRESSION: Pt reports 15% overall improvement since the start of care. Pt reports that she was able to do some housework without increased pain this morning. She doesn't want to  do DN or the Addaday today.  Pt continues to describe a "catch' in her neck and PT educated pt regarding muscle release with DN to reduce this. She prefers to avoid supine position.  She tends to activate upper traps with UE movement, although less today, and PT provided tactile and verbal cues to reduce this. Pt is working on postural corrections and scapular depression with daily tasks.  She has residual tension in Lt upper quadrant after breast surgery.    Patient will benefit from skilled PT to address the below impairments and improve overall function.    OBJECTIVE IMPAIRMENTS: decreased ROM, decreased strength, increased muscle spasms, impaired flexibility, impaired UE functional use, postural dysfunction, and pain.    ACTIVITY LIMITATIONS: sleeping and reach over head   PARTICIPATION LIMITATIONS: driving   PERSONAL FACTORS: Age, Fitness, Time since onset of injury/illness/exacerbation, and 1-2 comorbidities: OP and previous lumpectomy surgery  are also affecting patient's functional outcome.    REHAB POTENTIAL: Good   CLINICAL DECISION MAKING: Stable/uncomplicated   EVALUATION COMPLEXITY: Low     GOALS: Goals reviewed with patient? Yes   SHORT TERM GOALS: Target date: 02/23/2023    Patient will be independent with initial HEP.  Baseline:  Goal status: MET (02/01/23)       LONG TERM GOALS: Target date: 03/23/2023  Patient will be independent with advanced/ongoing HEP to improve outcomes and carryover.  Baseline:  Goal status: INITIAL   2.  Patient will report 75% improvement in left neck and shoulder pain to improve QOL.  Baseline: 15% (02/01/23) Goal status: in progress    3.  Patient will demonstrate functional pain free cervical ROM for safety with driving.  Baseline:  Goal status: INITIAL   4.  Patient will demonstrate improved posture to decrease muscle imbalance. Baseline:  Goal status: INITIAL   5.  Patient will report 70 on FOTO to demonstrate improved functional  ability.  Baseline: 47 Goal status: INITIAL   6.  Patient will demonstrate  improved active shoulder flexion to 150 deg or better without pain to ease OH ADLS.   Baseline:  Goal status: INITIAL    7. Patient will be able to sleep on L shoulder without waking from pain. Baseline:  Goal status: INITIAL     PLAN:   PT FREQUENCY: 2x/week   PT DURATION: 8 weeks   PLANNED INTERVENTIONS: Therapeutic exercises, Therapeutic activity, Neuromuscular re-education, Patient/Family education, Self Care, Joint mobilization, Dry Needling, Electrical stimulation, Cryotherapy, Moist heat, Taping, Ionotophoresis 4mg /ml Dexamethasone, and Manual therapy   PLAN FOR NEXT SESSION: DN if pt agrees to Lt, measure cervical A/ROM, continue postural strength and cervical/thoracic mobility   Sigurd Sos, PT 02/03/23 2:44 PM

## 2023-02-04 NOTE — Therapy (Signed)
OUTPATIENT PHYSICAL THERAPY TREATMENT NOTE   Patient Name: Shelly [name] MRN: [id number] DOB:August 12, 1946, 77 y.o., female Today's Date: 02/08/2023  PCP: Eulas Post, MD REFERRING PROVIDER: Eulas Post, MD  END OF SESSION:   PT End of Session - 02/08/23 1444     Visit Number 5    Date for PT Re-Evaluation 03/23/23    Authorization Type MCR    Progress Note Due on Visit 10    PT Start Time [id number]    PT Stop Time 1440    PT Time Calculation (min) 38 min    Activity Tolerance Patient tolerated treatment well    Behavior During Therapy WFL for tasks assessed/performed                Past Medical History:  Diagnosis Date   Allergy    Arthritis    wrist, shoulder    Cancer ([place]) 08/2019   left breast IDC   Diverticulitis    Endometrial polyp    Family history of breast cancer    GERD (gastroesophageal reflux disease)    occasional - diet controlled   Hx of adenomatous colonic polyps    Hyperlipidemia    IBS (irritable bowel syndrome)    Missed abortion    x 1 - resolved - no surgery   Osteoporosis    SVD (spontaneous vaginal delivery)    x 1   Transient vision disturbance 2019   was evaluated by ED, no findings   Vitamin D deficiency    Past Surgical History:  Procedure Laterality Date   BREAST EXCISIONAL BIOPSY Right 08/2019   Benign, lesion    BREAST LUMPECTOMY Left 09/2019   BREAST LUMPECTOMY WITH RADIOACTIVE SEED AND SENTINEL LYMPH NODE BIOPSY Left 10/06/2019   Procedure: LEFT BREAST LUMPECTOMY  X2 WITH RADIOACTIVE SEED X2  AND SENTINEL LYMPH NODE BIOPSY;  Surgeon: Jovita Kussmaul, MD;  Location: [place];  Service: General;  Laterality: Left;   BREAST LUMPECTOMY WITH RADIOACTIVE SEED LOCALIZATION Right 10/06/2019   Procedure: RIGHT BREAST LUMPECTOMY WITH RADIOACTIVE SEED LOCALIZATION;  Surgeon: Jovita Kussmaul, MD;  Location: [place];  Service: General;  Laterality: Right;   BRONCHIAL BIOPSY  12/27/2020    Procedure: BRONCHIAL BIOPSIES;  Surgeon: Garner Nash, DO;  Location: [facility];  Service: Pulmonary;;   BRONCHIAL BRUSHINGS  12/27/2020   Procedure: BRONCHIAL BRUSHINGS;  Surgeon: Garner Nash, DO;  Location: [facility];  Service: Pulmonary;;   BRONCHIAL NEEDLE ASPIRATION BIOPSY  12/27/2020   Procedure: BRONCHIAL NEEDLE ASPIRATION BIOPSIES;  Surgeon: Garner Nash, DO;  Location: [facility];  Service: Pulmonary;;   BRONCHIAL WASHINGS  12/27/2020   Procedure: BRONCHIAL WASHINGS;  Surgeon: Garner Nash, DO;  Location: [facility];  Service: Pulmonary;;   COLONOSCOPY     DIAGNOSTIC LAPAROSCOPY     cysts   DILATATION & CURETTAGE/HYSTEROSCOPY WITH TRUECLEAR N/A 10/31/2013   Procedure: DILATATION & CURETTAGE/HYSTEROSCOPY WITH TRUCLEAR;  Surgeon: Marylynn Pearson, MD;  Location: [facility];  Service: Gynecology;  Laterality: N/A;   DILATION AND CURETTAGE OF UTERUS     MASS EXCISION Left 11/06/2019   Procedure: EXCISION NIPPLE AND AREOLA  MARGIN LEFT BREAST;  Surgeon: Jovita Kussmaul, MD;  Location: [place];  Service: General;  Laterality: Left;   TONSILLECTOMY  1952   VIDEO BRONCHOSCOPY WITH ENDOBRONCHIAL NAVIGATION Bilateral 12/27/2020   Procedure: VIDEO BRONCHOSCOPY WITH ENDOBRONCHIAL NAVIGATION;  Surgeon: Garner Nash, DO;  Location: [place];  Service:  Pulmonary;  Laterality: Bilateral;   VIDEO BRONCHOSCOPY WITH ENDOBRONCHIAL ULTRASOUND Bilateral 12/27/2020   Procedure: VIDEO BRONCHOSCOPY WITH ENDOBRONCHIAL ULTRASOUND;  Surgeon: Garner Nash, DO;  Location: [place];  Service: Pulmonary;  Laterality: Bilateral;   WISDOM TOOTH EXTRACTION     Patient Active Problem List   Diagnosis Date Noted   Urinary frequency 04/12/2022   Acute bilateral low back pain without sciatica 04/12/2022   Essential hypertension 01/14/2022   Lung mass    Lesion of lung 12/04/2020   History of adenomatous polyp of colon 08/14/2020   Family history of breast cancer     Cancer of central portion of left female breast ([place]) 09/18/2019   Prediabetes 04/06/2017   Osteoporosis 06/12/2015   IBS (irritable bowel syndrome) 08/21/2014   Hyperlipidemia 08/05/2011   ABDOMINAL PAIN RIGHT UPPER QUADRANT 10/30/2008   ABDOMINAL BLOATING 10/25/2008   GERD 06/04/2008   DIVERTICULOSIS OF COLON 06/04/2008    REFERRING DIAG: M54.9 (ICD-10-CM) - Upper back pain on left side  THERAPY DIAG:  Cervicalgia  Acute pain of left shoulder  Abnormal posture  Cramp and spasm  Rationale for Evaluation and Treatment Rehabilitation  PERTINENT HISTORY: L breast CA lumpectomy 2020, OP  PRECAUTIONS: osteoporosis  SUBJECTIVE:                                                                                                                                                                   SUBJECTIVE STATEMENT:  I tried to do the exercises last night but must've over done it as my muscles are tight today.    PAIN:  Are you having pain? Yes: NPRS scale: 0-5/10 Pain location: left UT, neck and shoulder Pain description: strain, pulling Aggravating factors: turning R>L, sleeping on shoulder Relieving factors: tylenol   OBJECTIVE:    DIAGNOSTIC FINDINGS:  Arthritis in neck per pt (xray from [facility].   PATIENT SURVEYS:  FOTO 59 (predicted 60)   COGNITION: Overall cognitive status: Within functional limits for tasks assessed   SENSATION: WFL   POSTURE: rounded shoulders, forward head, and increased thoracic kyphosis   PALPATION: Palpation: TTP at L UT, LS, lats, subscap, pects. Increased tissue tension in L UT, LS Spinal Mobility:WFL     CERVICAL ROM: *pain   Active ROM A/PROM (deg) eval  Flexion 15*  Extension 10  Right lateral flexion 26*  Left lateral flexion 38  Right rotation 38  Left rotation 27*   (Blank rows = not tested)   UPPER EXTREMITY ROM:   A/P ROM Right eval Left eval  Shoulder flexion   143/150  Shoulder extension   FULL   Shoulder abduction   115/129  Shoulder adduction      Shoulder extension      Shoulder internal rotation  FULL  Shoulder external rotation   55/68   (Blank rows = not tested)   UPPER EXTREMITY MMT: L shoulder ABD 4/5 with pain, else 5/5; L mid trap 4-/5, low trap 4+/5; R mid trap 4+, low trap 4-/5   SHOULDER SPECIAL TESTS: Positive HK, neg Impingement   LUMBAR ROM; WFL - tight HS limiting flexion, tight in rotation bil 25%     TODAY'S TREATMENT:                                                                                                                              DATE:   02/08/23: NuStep L5 x 5' PT present to discuss plan for session Bilat wall slide shoulder flexion x 10 Lt pec stretch in doorway 2x30" Standing bil UE row and ext red tband 1x15 each Discussed different bra's with cancer PT due to reports of increased pain with recent bra's.            Ball roll outs: forward and lateral x5 each with 10" hold  Standing horiz abd and ER bil UE yellow theraband: horizontal abduction 2x10, ER 2x5 Seated UT/ LS stretch 2x30 sec Introduced pt to thera-cane for self mobilization.   02/03/23: NuStep L3 x 5' PT present to discuss plan for session Lt wall slide shoulder flexion x 10 Lt pec stretch in doorway 2x30" Standing bil UE row and ext red tband 1x15 each Ball roll outs: forward and lateral x5 each with 10" hold  Standing horiz abd and ER bil UE yellow theraband: horizontal abduction 2x10, ER 2x5 Manual: elongation to bil upper traps and neck in sitting   02/01/23: NuStep L3 x 5' PT present to discuss plan for session Lt wall slide shoulder flexion x 10 Lt pec stretch in doorway 2x30" Standing bil UE row and ext red tband 1x15 each Seated levator and upper trap stretch with light overpressure x30" each, bil- moderate tactile cues for alignment Standing horiz abd and ER bil UE yellow theraband: horizontal abduction x10, ER 2x5 Open book laying on Rt side opening into Lt  rotation x10 - great stretch through Lt pec Manual: addaday to Lt upper trap, scapula   01/28/23: NuStep L3 x 3' PT present to discuss plan for session Lt wall slide shoulder flexion x 10 Lt pec stretch in doorway 2x30" Standing bil UE row and ext red tband 1x10 each Seated levator and upper trap stretch with light overpressure x30" each, bil Standing horiz abd and ER bil UE red tband 1x10 each - very fatigued with horiz abd Open book laying on Rt side opening into Lt rotation x10 - great stretch through Lt pec Seated Lt ER AA/ROM and end range stretch with dowel x10  01/26/23 See pt ed   PATIENT EDUCATION:  Education details: PT eval findings, anticipated POC, initial HEP, and postural awareness   Person educated: Patient Education method: Explanation, Demonstration, and Handouts Education comprehension: verbalized understanding and returned demonstration  HOME EXERCISE PROGRAM: Access Code: [email or web address] URL: [email or web address] Date: 01/28/2023 Prepared by: Venetia Night Beuhring  Exercises - Supine Chin Tuck  - 1 x daily - 7 x weekly - 3 sets - 10 reps - Single Arm Doorway Pec Stretch at 90 Degrees Abduction  - 2 x daily - 7 x weekly - 1 sets - 3 reps - 30 sec hold - Standing Single Shoulder Flexion Wall Slide with Palm Up  - 2 x daily - 7 x weekly - 1 sets - 10 reps - Seated Gentle Upper Trapezius Stretch (Mirrored)  - 2 x daily - 7 x weekly - 1 sets - 3 reps - 30 sec hold - Gentle Levator Scapulae Stretch  - 2 x daily - 7 x weekly - 1 sets - 3 reps - 30 sec hold - Standing Row with Anchored Resistance  - 1 x daily - 7 x weekly - 2 sets - 10 reps - Shoulder extension with resistance - Neutral  - 1 x daily - 7 x weekly - 2 sets - 10 reps - Supine Shoulder Horizontal Abduction with Resistance  - 1 x daily - 7 x weekly - 2 sets - 10 reps - Standing Shoulder Horizontal Abduction with Resistance  - 1 x daily - 7 x weekly - 2 sets - 10 reps - Shoulder External Rotation and  Scapular Retraction with Resistance  - 1 x daily - 7 x weekly - 2 sets - 10 reps - Sidelying Thoracic Rotation with Open Book  - 1 x daily - 7 x weekly - 2 sets - 10 reps - Seated Shoulder External Rotation AAROM with Cane and Hand in Neutral  - 1 x daily - 7 x weekly - 2 sets - 10 reps   ASSESSMENT:   CLINICAL IMPRESSION: Pt reports to PT with continued muscle tension in her neck. She states that she was active with her HEP, but thinks she may have overdone it due to increased tension and pain today. Reviewed exercises with cues for proper form. Further discussion of DN with pt reporting no relief from DN or STM in past. She states that the knot continues to appear. Educated pt on self mobilization with theracane today with good relief. Pt plans to buy one today. Pt discussed bra recommendations with cancer PT today due to reports of increased pain and tension from recently purchased bra's. She has plans to seek out new bra's with appts scheduled in the next month. She has residual tension in Lt upper quadrant after breast surgery.    Patient will benefit from skilled PT to address the below impairments and improve overall function.    OBJECTIVE IMPAIRMENTS: decreased ROM, decreased strength, increased muscle spasms, impaired flexibility, impaired UE functional use, postural dysfunction, and pain.    ACTIVITY LIMITATIONS: sleeping and reach over head   PARTICIPATION LIMITATIONS: driving   PERSONAL FACTORS: Age, Fitness, Time since onset of injury/illness/exacerbation, and 1-2 comorbidities: OP and previous lumpectomy surgery  are also affecting patient's functional outcome.    REHAB POTENTIAL: Good   CLINICAL DECISION MAKING: Stable/uncomplicated   EVALUATION COMPLEXITY: Low     GOALS: Goals reviewed with patient? Yes   SHORT TERM GOALS: Target date: 02/23/2023    Patient will be independent with initial HEP.  Baseline:  Goal status: MET (02/01/23)       LONG TERM GOALS: Target date:  03/23/2023  Patient will be independent with advanced/ongoing HEP to improve outcomes and carryover.  Baseline:  Goal status: INITIAL  2.  Patient will report 75% improvement in left neck and shoulder pain to improve QOL.  Baseline: 15% (02/01/23) Goal status: in progress    3.  Patient will demonstrate functional pain free cervical ROM for safety with driving.  Baseline:  Goal status: INITIAL   4.  Patient will demonstrate improved posture to decrease muscle imbalance. Baseline:  Goal status: INITIAL   5.  Patient will report 63 on FOTO to demonstrate improved functional ability.  Baseline: 47 Goal status: INITIAL   6.  Patient will demonstrate improved active shoulder flexion to 150 deg or better without pain to ease OH ADLS.   Baseline:  Goal status: INITIAL    7. Patient will be able to sleep on L shoulder without waking from pain. Baseline:  Goal status: INITIAL     PLAN:   PT FREQUENCY: 2x/week   PT DURATION: 8 weeks   PLANNED INTERVENTIONS: Therapeutic exercises, Therapeutic activity, Neuromuscular re-education, Patient/Family education, Self Care, Joint mobilization, Dry Needling, Electrical stimulation, Cryotherapy, Moist heat, Taping, Ionotophoresis 4mg /ml Dexamethasone, and Manual therapy   PLAN FOR NEXT SESSION: DN if pt agrees to Lt, measure cervical A/ROM, continue postural strength and cervical/thoracic mobility   Rudi Heap PT, DPT 02/08/23  2:45 PM

## 2023-02-08 ENCOUNTER — Encounter: Payer: Self-pay | Admitting: Physical Therapy

## 2023-02-08 ENCOUNTER — Ambulatory Visit: Payer: Medicare Other | Admitting: Physical Therapy

## 2023-02-08 DIAGNOSIS — M542 Cervicalgia: Secondary | ICD-10-CM

## 2023-02-08 DIAGNOSIS — R252 Cramp and spasm: Secondary | ICD-10-CM

## 2023-02-08 DIAGNOSIS — M25512 Pain in left shoulder: Secondary | ICD-10-CM | POA: Diagnosis not present

## 2023-02-08 DIAGNOSIS — R293 Abnormal posture: Secondary | ICD-10-CM | POA: Diagnosis not present

## 2023-02-08 DIAGNOSIS — M549 Dorsalgia, unspecified: Secondary | ICD-10-CM | POA: Diagnosis not present

## 2023-02-15 ENCOUNTER — Ambulatory Visit: Payer: Medicare Other | Attending: Family Medicine

## 2023-02-15 DIAGNOSIS — M542 Cervicalgia: Secondary | ICD-10-CM | POA: Diagnosis not present

## 2023-02-15 DIAGNOSIS — R293 Abnormal posture: Secondary | ICD-10-CM | POA: Insufficient documentation

## 2023-02-15 DIAGNOSIS — R252 Cramp and spasm: Secondary | ICD-10-CM | POA: Insufficient documentation

## 2023-02-15 DIAGNOSIS — M25512 Pain in left shoulder: Secondary | ICD-10-CM | POA: Insufficient documentation

## 2023-02-15 NOTE — Therapy (Signed)
OUTPATIENT PHYSICAL THERAPY TREATMENT NOTE   Patient Name: Shelly [name] MRN: [id number] DOB:[birth year], 77 y.o., female Today's Date: 02/15/2023  PCP: Eulas Post, MD REFERRING PROVIDER: Eulas Post, MD  END OF SESSION:   PT End of Session - 02/15/23 1229     Visit Number 6    Date for PT Re-Evaluation 03/23/23    Authorization Type MCR    Progress Note Due on Visit 10    PT Start Time [id number]    PT Stop Time 1232    PT Time Calculation (min) 47 min    Activity Tolerance Patient tolerated treatment well    Behavior During Therapy [facility] for tasks assessed/performed                 Past Medical History:  Diagnosis Date   Allergy    Arthritis    wrist, shoulder    Cancer 08/2019   left breast IDC   Diverticulitis    Endometrial polyp    Family history of breast cancer    GERD (gastroesophageal reflux disease)    occasional - diet controlled   Hx of adenomatous colonic polyps    Hyperlipidemia    IBS (irritable bowel syndrome)    Missed abortion    x 1 - resolved - no surgery   Osteoporosis    SVD (spontaneous vaginal delivery)    x 1   Transient vision disturbance 2019   was evaluated by ED, no findings   Vitamin D deficiency    Past Surgical History:  Procedure Laterality Date   BREAST EXCISIONAL BIOPSY Right 08/2019   Benign, lesion    BREAST LUMPECTOMY Left 09/2019   BREAST LUMPECTOMY WITH RADIOACTIVE SEED AND SENTINEL LYMPH NODE BIOPSY Left 10/06/2019   Procedure: LEFT BREAST LUMPECTOMY  X2 WITH RADIOACTIVE SEED X2  AND SENTINEL LYMPH NODE BIOPSY;  Surgeon: Jovita Kussmaul, MD;  Location: [place];  Service: General;  Laterality: Left;   BREAST LUMPECTOMY WITH RADIOACTIVE SEED LOCALIZATION Right 10/06/2019   Procedure: RIGHT BREAST LUMPECTOMY WITH RADIOACTIVE SEED LOCALIZATION;  Surgeon: Jovita Kussmaul, MD;  Location: [place];  Service: General;  Laterality: Right;   BRONCHIAL BIOPSY  12/27/2020    Procedure: BRONCHIAL BIOPSIES;  Surgeon: Garner Nash, DO;  Location: [place] [facility];  Service: Pulmonary;;   BRONCHIAL BRUSHINGS  12/27/2020   Procedure: BRONCHIAL BRUSHINGS;  Surgeon: Garner Nash, DO;  Location: [place] [facility];  Service: Pulmonary;;   BRONCHIAL NEEDLE ASPIRATION BIOPSY  12/27/2020   Procedure: BRONCHIAL NEEDLE ASPIRATION BIOPSIES;  Surgeon: Garner Nash, DO;  Location: [place] [facility];  Service: Pulmonary;;   BRONCHIAL WASHINGS  12/27/2020   Procedure: BRONCHIAL WASHINGS;  Surgeon: Garner Nash, DO;  Location: [place] [facility];  Service: Pulmonary;;   COLONOSCOPY     DIAGNOSTIC LAPAROSCOPY     cysts   DILATATION & CURETTAGE/HYSTEROSCOPY WITH TRUECLEAR N/A 10/31/2013   Procedure: DILATATION & CURETTAGE/HYSTEROSCOPY WITH TRUCLEAR;  Surgeon: Marylynn Pearson, MD;  Location: [place] [facility];  Service: Gynecology;  Laterality: N/A;   DILATION AND CURETTAGE OF UTERUS     MASS EXCISION Left 11/06/2019   Procedure: EXCISION NIPPLE AND AREOLA  MARGIN LEFT BREAST;  Surgeon: Jovita Kussmaul, MD;  Location: [place];  Service: General;  Laterality: Left;   TONSILLECTOMY  1952   VIDEO BRONCHOSCOPY WITH ENDOBRONCHIAL NAVIGATION Bilateral 12/27/2020   Procedure: VIDEO BRONCHOSCOPY WITH ENDOBRONCHIAL NAVIGATION;  Surgeon: Garner Nash, DO;  Location: [place];  Service:  Pulmonary;  Laterality: Bilateral;   VIDEO BRONCHOSCOPY WITH ENDOBRONCHIAL ULTRASOUND Bilateral 12/27/2020   Procedure: VIDEO BRONCHOSCOPY WITH ENDOBRONCHIAL ULTRASOUND;  Surgeon: Garner Nash, DO;  Location: [place];  Service: Pulmonary;  Laterality: Bilateral;   WISDOM TOOTH EXTRACTION     Patient Active Problem List   Diagnosis Date Noted   Urinary frequency 04/12/2022   Acute bilateral low back pain without sciatica 04/12/2022   Essential hypertension 01/14/2022   Lung mass    Lesion of lung 12/04/2020   History of adenomatous polyp of colon 08/14/2020   Family history of breast cancer     Cancer of central portion of left female breast 09/18/2019   Prediabetes 04/06/2017   Osteoporosis 06/12/2015   IBS (irritable bowel syndrome) 08/21/2014   Hyperlipidemia 08/05/2011   ABDOMINAL PAIN RIGHT UPPER QUADRANT 10/30/2008   ABDOMINAL BLOATING 10/25/2008   GERD 06/04/2008   DIVERTICULOSIS OF COLON 06/04/2008    REFERRING DIAG: M54.9 (ICD-10-CM) - Upper back pain on left side  THERAPY DIAG:  Cervicalgia  Acute pain of left shoulder  Abnormal posture  Cramp and spasm  Rationale for Evaluation and Treatment Rehabilitation  PERTINENT HISTORY: L breast CA lumpectomy 2020, OP  PRECAUTIONS: osteoporosis  SUBJECTIVE:                                                                                                                                                                   SUBJECTIVE STATEMENT:  I'm ready to try DN.  The massage that you did 2 visits ago really helped me for 2 days.   PAIN:  Are you having pain? Yes: NPRS scale: 0-4/10 Pain location: left UT, neck and shoulder Pain description: strain, pulling Aggravating factors: turning R>L, sleeping on shoulder Relieving factors: tylenol   OBJECTIVE:    DIAGNOSTIC FINDINGS:  Arthritis in neck per pt (xray from [facility].   PATIENT SURVEYS:  FOTO 31 (predicted 60)   COGNITION: Overall cognitive status: Within functional limits for tasks assessed   SENSATION: WFL   POSTURE: rounded shoulders, forward head, and increased thoracic kyphosis   PALPATION: Palpation: TTP at L UT, LS, lats, subscap, pects. Increased tissue tension in L UT, LS Spinal Mobility:WFL     CERVICAL ROM: *pain   Active ROM A/PROM (deg) eval A/ROM 02/15/23  Flexion 15* 50  Extension 10   Right lateral flexion 26* 32  Left lateral flexion 38 40  Right rotation 38 55  Left rotation 27* 40   (Blank rows = not tested)   UPPER EXTREMITY ROM:   A/P ROM Right eval Left eval  Shoulder flexion   143/150  Shoulder  extension   FULL  Shoulder abduction   115/129  Shoulder adduction      Shoulder extension  Shoulder internal rotation   FULL  Shoulder external rotation   55/68   (Blank rows = not tested)   UPPER EXTREMITY MMT: L shoulder ABD 4/5 with pain, else 5/5; L mid trap 4-/5, low trap 4+/5; R mid trap 4+, low trap 4-/5   SHOULDER SPECIAL TESTS: Positive HK, neg Impingement   LUMBAR ROM; WFL - tight HS limiting flexion, tight in rotation bil 25%     TODAY'S TREATMENT:         02/15/23: Cervical A/ROM 3 ways: 3x20 seconds  NuStep: Level 3x 7 minutes- PT present to discuss Ball roll outs 3 ways Trigger Point Dry-Needling  Treatment instructions: Expect mild to moderate muscle soreness. S/S of pneumothorax if dry needled over a lung field, and to seek immediate medical attention should they occur. Patient verbalized understanding of these instructions and education.  Patient Consent Given: Yes Education handout provided: Previously provided Muscles treated: Lt upper trap, bil cervical multifidi Treatment response/outcome: Utilized skilled palpation to identify trigger points.  During dry needling able to palpate muscle twitch and muscle elongation  Elongation and release to neck after DN Skilled palpation and monitoring by PT during dry needling  02/08/23: NuStep L5 x 6' PT present to discuss plan for session Bilat wall slide shoulder flexion x 10 Lt pec stretch in doorway 2x30" Standing bil UE row and ext red tband 1x15 each Discussed different bra's with cancer PT due to reports of increased pain with recent bra's.            Ball roll outs: forward and lateral x5 each with 10" hold  Standing horiz abd and ER bil UE yellow theraband: horizontal abduction 2x10, ER 2x5 Seated UT/ LS stretch 2x30 sec Introduced pt to thera-cane for self mobilization.                                                                                                                       DATE:    02/08/23: NuStep L5 x 5' PT present to discuss plan for session Bilat wall slide shoulder flexion x 10 Lt pec stretch in doorway 2x30" Standing bil UE row and ext red tband 1x15 each Discussed different bra's with cancer PT due to reports of increased pain with recent bra's.            Ball roll outs: forward and lateral x5 each with 10" hold  Standing horiz abd and ER bil UE yellow theraband: horizontal abduction 2x10, ER 2x5 Seated UT/ LS stretch 2x30 sec Introduced pt to thera-cane for self mobilization.   02/03/23: NuStep L3 x 5' PT present to discuss plan for session Lt wall slide shoulder flexion x 10 Lt pec stretch in doorway 2x30" Standing bil UE row and ext red tband 1x15 each Ball roll outs: forward and lateral x5 each with 10" hold  Standing horiz abd and ER bil UE yellow theraband: horizontal abduction 2x10, ER 2x5 Manual: elongation to bil upper traps and neck in sitting  02/01/23: NuStep L3 x 5' PT present to discuss plan for session Lt wall slide shoulder flexion x 10 Lt pec stretch in doorway 2x30" Standing bil UE row and ext red tband 1x15 each Seated levator and upper trap stretch with light overpressure x30" each, bil- moderate tactile cues for alignment Standing horiz abd and ER bil UE yellow theraband: horizontal abduction x10, ER 2x5 Open book laying on Rt side opening into Lt rotation x10 - great stretch through Lt pec Manual: addaday to Lt upper trap, scapula     PATIENT EDUCATION:  Education details: PT eval findings, anticipated POC, initial HEP, and postural awareness   Person educated: Patient Education method: Explanation, Demonstration, and Handouts Education comprehension: verbalized understanding and returned demonstration   HOME EXERCISE PROGRAM: Access Code: [email or web address] URL: [email or web address] Date: 01/28/2023 Prepared by: Venetia Night Beuhring  Exercises - Supine Chin Tuck  - 1 x daily - 7 x weekly - 3 sets - 10 reps - Single  Arm Doorway Pec Stretch at 90 Degrees Abduction  - 2 x daily - 7 x weekly - 1 sets - 3 reps - 30 sec hold - Standing Single Shoulder Flexion Wall Slide with Palm Up  - 2 x daily - 7 x weekly - 1 sets - 10 reps - Seated Gentle Upper Trapezius Stretch (Mirrored)  - 2 x daily - 7 x weekly - 1 sets - 3 reps - 30 sec hold - Gentle Levator Scapulae Stretch  - 2 x daily - 7 x weekly - 1 sets - 3 reps - 30 sec hold - Standing Row with Anchored Resistance  - 1 x daily - 7 x weekly - 2 sets - 10 reps - Shoulder extension with resistance - Neutral  - 1 x daily - 7 x weekly - 2 sets - 10 reps - Supine Shoulder Horizontal Abduction with Resistance  - 1 x daily - 7 x weekly - 2 sets - 10 reps - Standing Shoulder Horizontal Abduction with Resistance  - 1 x daily - 7 x weekly - 2 sets - 10 reps - Shoulder External Rotation and Scapular Retraction with Resistance  - 1 x daily - 7 x weekly - 2 sets - 10 reps - Sidelying Thoracic Rotation with Open Book  - 1 x daily - 7 x weekly - 2 sets - 10 reps - Seated Shoulder External Rotation AAROM with Cane and Hand in Neutral  - 1 x daily - 7 x weekly - 2 sets - 10 reps   ASSESSMENT:   CLINICAL IMPRESSION: Pt reports good relief after manual therapy last week and she is receptive to DN today.  Pt is doing well with her HEP and reports compliance.   Pt with good twitch response to DN and improved tissue mobility after manual therapy today.  Improved cervical A/ROM with measures today.    Patient will benefit from skilled PT to address the below impairments and improve overall function.    OBJECTIVE IMPAIRMENTS: decreased ROM, decreased strength, increased muscle spasms, impaired flexibility, impaired UE functional use, postural dysfunction, and pain.    ACTIVITY LIMITATIONS: sleeping and reach over head   PARTICIPATION LIMITATIONS: driving   PERSONAL FACTORS: Age, Fitness, Time since onset of injury/illness/exacerbation, and 1-2 comorbidities: OP and previous lumpectomy  surgery  are also affecting patient's functional outcome.    REHAB POTENTIAL: Good   CLINICAL DECISION MAKING: Stable/uncomplicated   EVALUATION COMPLEXITY: Low     GOALS: Goals reviewed with patient? Yes   SHORT TERM  GOALS: Target date: 02/23/2023    Patient will be independent with initial HEP.  Baseline:  Goal status: MET (02/01/23)       LONG TERM GOALS: Target date: 03/23/2023  Patient will be independent with advanced/ongoing HEP to improve outcomes and carryover.  Baseline:  Goal status: INITIAL   2.  Patient will report 75% improvement in left neck and shoulder pain to improve QOL.  Baseline: 15% (02/01/23) Goal status: in progress    3.  Patient will demonstrate functional pain free cervical ROM for safety with driving.  Baseline:  Goal status: INITIAL   4.  Patient will demonstrate improved posture to decrease muscle imbalance. Baseline:  Goal status: INITIAL   5.  Patient will report 51 on FOTO to demonstrate improved functional ability.  Baseline: 47 Goal status: INITIAL   6.  Patient will demonstrate improved active shoulder flexion to 150 deg or better without pain to ease OH ADLS.   Baseline:  Goal status: INITIAL    7. Patient will be able to sleep on L shoulder without waking from pain. Baseline:  Goal status: INITIAL     PLAN:   PT FREQUENCY: 2x/week   PT DURATION: 8 weeks   PLANNED INTERVENTIONS: Therapeutic exercises, Therapeutic activity, Neuromuscular re-education, Patient/Family education, Self Care, Joint mobilization, Dry Needling, Electrical stimulation, Cryotherapy, Moist heat, Taping, Ionotophoresis 4mg /ml Dexamethasone, and Manual therapy   PLAN FOR NEXT SESSION: see how pt did with DN,  continue postural strength and cervical/thoracic mobility   Sigurd Sos, PT 02/15/23 12:37 PM

## 2023-02-17 ENCOUNTER — Ambulatory Visit: Payer: Medicare Other

## 2023-02-17 DIAGNOSIS — M25512 Pain in left shoulder: Secondary | ICD-10-CM | POA: Diagnosis not present

## 2023-02-17 DIAGNOSIS — R252 Cramp and spasm: Secondary | ICD-10-CM | POA: Diagnosis not present

## 2023-02-17 DIAGNOSIS — M542 Cervicalgia: Secondary | ICD-10-CM | POA: Diagnosis not present

## 2023-02-17 DIAGNOSIS — R293 Abnormal posture: Secondary | ICD-10-CM | POA: Diagnosis not present

## 2023-02-17 NOTE — Therapy (Signed)
OUTPATIENT PHYSICAL THERAPY TREATMENT NOTE   Patient Name: Shelly [name] MRN: [id number] DOB:1946/11/12, 77 y.o., female Today's Date: 02/17/2023  PCP: Eulas Post, MD REFERRING PROVIDER: Eulas Post, MD  END OF SESSION:   PT End of Session - 02/17/23 1435     Visit Number 7    Date for PT Re-Evaluation 03/23/23    Authorization Type MCR    Progress Note Due on Visit 10    PT Start Time [id number]    PT Stop Time [id number]    PT Time Calculation (min) 38 min    Activity Tolerance Patient tolerated treatment well    Behavior During Therapy WFL for tasks assessed/performed                  Past Medical History:  Diagnosis Date   Allergy    Arthritis    wrist, shoulder    Cancer 08/2019   left breast IDC   Diverticulitis    Endometrial polyp    Family history of breast cancer    GERD (gastroesophageal reflux disease)    occasional - diet controlled   Hx of adenomatous colonic polyps    Hyperlipidemia    IBS (irritable bowel syndrome)    Missed abortion    x 1 - resolved - no surgery   Osteoporosis    SVD (spontaneous vaginal delivery)    x 1   Transient vision disturbance 2019   was evaluated by ED, no findings   Vitamin D deficiency    Past Surgical History:  Procedure Laterality Date   BREAST EXCISIONAL BIOPSY Right 08/2019   Benign, lesion    BREAST LUMPECTOMY Left 09/2019   BREAST LUMPECTOMY WITH RADIOACTIVE SEED AND SENTINEL LYMPH NODE BIOPSY Left 10/06/2019   Procedure: LEFT BREAST LUMPECTOMY  X2 WITH RADIOACTIVE SEED X2  AND SENTINEL LYMPH NODE BIOPSY;  Surgeon: Jovita Kussmaul, MD;  Location: [place];  Service: General;  Laterality: Left;   BREAST LUMPECTOMY WITH RADIOACTIVE SEED LOCALIZATION Right 10/06/2019   Procedure: RIGHT BREAST LUMPECTOMY WITH RADIOACTIVE SEED LOCALIZATION;  Surgeon: Jovita Kussmaul, MD;  Location: [place];  Service: General;  Laterality: Right;   BRONCHIAL BIOPSY  12/27/2020    Procedure: BRONCHIAL BIOPSIES;  Surgeon: Garner Nash, DO;  Location: [facility];  Service: Pulmonary;;   BRONCHIAL BRUSHINGS  12/27/2020   Procedure: BRONCHIAL BRUSHINGS;  Surgeon: Garner Nash, DO;  Location: [facility];  Service: Pulmonary;;   BRONCHIAL NEEDLE ASPIRATION BIOPSY  12/27/2020   Procedure: BRONCHIAL NEEDLE ASPIRATION BIOPSIES;  Surgeon: Garner Nash, DO;  Location: [facility];  Service: Pulmonary;;   BRONCHIAL WASHINGS  12/27/2020   Procedure: BRONCHIAL WASHINGS;  Surgeon: Garner Nash, DO;  Location: [facility];  Service: Pulmonary;;   COLONOSCOPY     DIAGNOSTIC LAPAROSCOPY     cysts   DILATATION & CURETTAGE/HYSTEROSCOPY WITH TRUECLEAR N/A 10/31/2013   Procedure: DILATATION & CURETTAGE/HYSTEROSCOPY WITH TRUCLEAR;  Surgeon: Marylynn Pearson, MD;  Location: [facility];  Service: Gynecology;  Laterality: N/A;   DILATION AND CURETTAGE OF UTERUS     MASS EXCISION Left 11/06/2019   Procedure: EXCISION NIPPLE AND AREOLA  MARGIN LEFT BREAST;  Surgeon: Jovita Kussmaul, MD;  Location: [place];  Service: General;  Laterality: Left;   TONSILLECTOMY  1952   VIDEO BRONCHOSCOPY WITH ENDOBRONCHIAL NAVIGATION Bilateral 12/27/2020   Procedure: VIDEO BRONCHOSCOPY WITH ENDOBRONCHIAL NAVIGATION;  Surgeon: Garner Nash, DO;  Location: [place];  Service: Pulmonary;  Laterality: Bilateral;   VIDEO BRONCHOSCOPY WITH ENDOBRONCHIAL ULTRASOUND Bilateral 12/27/2020   Procedure: VIDEO BRONCHOSCOPY WITH ENDOBRONCHIAL ULTRASOUND;  Surgeon: Garner Nash, DO;  Location: [place];  Service: Pulmonary;  Laterality: Bilateral;   WISDOM TOOTH EXTRACTION     Patient Active Problem List   Diagnosis Date Noted   Urinary frequency 04/12/2022   Acute bilateral low back pain without sciatica 04/12/2022   Essential hypertension 01/14/2022   Lung mass    Lesion of lung 12/04/2020   History of adenomatous polyp of colon 08/14/2020   Family history of breast cancer     Cancer of central portion of left female breast 09/18/2019   Prediabetes 04/06/2017   Osteoporosis 06/12/2015   IBS (irritable bowel syndrome) 08/21/2014   Hyperlipidemia 08/05/2011   ABDOMINAL PAIN RIGHT UPPER QUADRANT 10/30/2008   ABDOMINAL BLOATING 10/25/2008   GERD 06/04/2008   DIVERTICULOSIS OF COLON 06/04/2008    REFERRING DIAG: M54.9 (ICD-10-CM) - Upper back pain on left side  THERAPY DIAG:  Cervicalgia  Acute pain of left shoulder  Abnormal posture  Cramp and spasm  Rationale for Evaluation and Treatment Rehabilitation  PERTINENT HISTORY: L breast CA lumpectomy 2020, OP  PRECAUTIONS: osteoporosis  SUBJECTIVE:                                                                                                                                                                   SUBJECTIVE STATEMENT:  I was stiff after the DN.  My neck just tightened up.  The massage that we did last week really helped me.     PAIN:  Are you having pain? Yes: NPRS scale: 0-4/10 Pain location: left UT, neck and shoulder Pain description: strain, pulling Aggravating factors: turning R>L, sleeping on shoulder Relieving factors: tylenol   OBJECTIVE:  DIAGNOSTIC FINDINGS:  Arthritis in neck per pt (xray from [facility].   PATIENT SURVEYS:  FOTO 74 (predicted 60)   COGNITION: Overall cognitive status: Within functional limits for tasks assessed   SENSATION: WFL   POSTURE: rounded shoulders, forward head, and increased thoracic kyphosis   PALPATION: Palpation: TTP at L UT, LS, lats, subscap, pects. Increased tissue tension in L UT, LS Spinal Mobility:WFL     CERVICAL ROM: *pain   Active ROM A/PROM (deg) eval A/ROM 02/15/23  Flexion 15* 50  Extension 10   Right lateral flexion 26* 32  Left lateral flexion 38 40  Right rotation 38 55  Left rotation 27* 40   (Blank rows = not tested)   UPPER EXTREMITY ROM:   A/P ROM Right eval Left eval  Shoulder flexion   143/150   Shoulder extension   FULL  Shoulder abduction   115/129  Shoulder adduction  Shoulder extension      Shoulder internal rotation   FULL  Shoulder external rotation   55/68   (Blank rows = not tested)   UPPER EXTREMITY MMT: L shoulder ABD 4/5 with pain, else 5/5; L mid trap 4-/5, low trap 4+/5; R mid trap 4+, low trap 4-/5   SHOULDER SPECIAL TESTS: Positive HK, neg Impingement   LUMBAR ROM; WFL - tight HS limiting flexion, tight in rotation bil 25%     TODAY'S TREATMENT:         02/17/23: NuStep L5 x 8' PT present to discuss plan for session Bilat wall slide shoulder flexion x 10 Lt pec stretch in doorway 3x10 seconds  Standing bil UE row and ext red tband 2x10         Ball roll outs: forward and lateral x5 each with 10" hold  Seated UT/ Levator  stretch 2x30 sec Manual: elongation and release to Lt upper trap and neck            02/15/23: Cervical A/ROM 3 ways: 3x20 seconds  NuStep: Level 3x 7 minutes- PT present to discuss Ball roll outs 3 ways Trigger Point Dry-Needling  Treatment instructions: Expect mild to moderate muscle soreness. S/S of pneumothorax if dry needled over a lung field, and to seek immediate medical attention should they occur. Patient verbalized understanding of these instructions and education.  Patient Consent Given: Yes Education handout provided: Previously provided Muscles treated: Lt upper trap, bil cervical multifidi Treatment response/outcome: Utilized skilled palpation to identify trigger points.  During dry needling able to palpate muscle twitch and muscle elongation  Elongation and release to neck after DN Skilled palpation and monitoring by PT during dry needling                                                                                                                    DATE:   02/08/23: NuStep L5 x 5' PT present to discuss plan for session Bilat wall slide shoulder flexion x 10 Lt pec stretch in doorway 2x30" Standing bil UE row and  ext red tband 1x15 each Discussed different bra's with cancer PT due to reports of increased pain with recent bra's.            Ball roll outs: forward and lateral x5 each with 10" hold  Standing horiz abd and ER bil UE yellow theraband: horizontal abduction 2x10, ER 2x5 Seated UT/ LS stretch 2x30 sec Introduced pt to thera-cane for self mobilization.    PATIENT EDUCATION:  Education details: PT eval findings, anticipated POC, initial HEP, and postural awareness   Person educated: Patient Education method: Explanation, Demonstration, and Handouts Education comprehension: verbalized understanding and returned demonstration   HOME EXERCISE PROGRAM: Access Code: [email or web address] URL: [email or web address] Date: 01/28/2023 Prepared by: Venetia Night Beuhring  Exercises - Supine Chin Tuck  - 1 x daily - 7 x weekly - 3 sets - 10 reps - Single Arm Doorway Pec Stretch at 90 Degrees Abduction  - 2 x daily -  7 x weekly - 1 sets - 3 reps - 30 sec hold - Standing Single Shoulder Flexion Wall Slide with Palm Up  - 2 x daily - 7 x weekly - 1 sets - 10 reps - Seated Gentle Upper Trapezius Stretch (Mirrored)  - 2 x daily - 7 x weekly - 1 sets - 3 reps - 30 sec hold - Gentle Levator Scapulae Stretch  - 2 x daily - 7 x weekly - 1 sets - 3 reps - 30 sec hold - Standing Row with Anchored Resistance  - 1 x daily - 7 x weekly - 2 sets - 10 reps - Shoulder extension with resistance - Neutral  - 1 x daily - 7 x weekly - 2 sets - 10 reps - Supine Shoulder Horizontal Abduction with Resistance  - 1 x daily - 7 x weekly - 2 sets - 10 reps - Standing Shoulder Horizontal Abduction with Resistance  - 1 x daily - 7 x weekly - 2 sets - 10 reps - Shoulder External Rotation and Scapular Retraction with Resistance  - 1 x daily - 7 x weekly - 2 sets - 10 reps - Sidelying Thoracic Rotation with Open Book  - 1 x daily - 7 x weekly - 2 sets - 10 reps - Seated Shoulder External Rotation AAROM with Cane and Hand in Neutral   - 1 x daily - 7 x weekly - 2 sets - 10 reps   ASSESSMENT:   CLINICAL IMPRESSION: Pt reports increased stiffness in the Lt neck after DN last session.  Pt demonstrated improved cervical mobility last session. Pt is now able to sleep on her Lt side without waking due to pain. Pt only experiences pain when she turns her head to the Rt.  Pt has been doing her exercises for flexiblity and strength.  Pt did well with exercises in the clinic without any increased pain and PT monitored for alignment and technique. Pt with tension and trigger points in Lt upper trap and levator and repsponded well to manual therapy.   Patient will benefit from skilled PT to address the below impairments and improve overall function.    OBJECTIVE IMPAIRMENTS: decreased ROM, decreased strength, increased muscle spasms, impaired flexibility, impaired UE functional use, postural dysfunction, and pain.    ACTIVITY LIMITATIONS: sleeping and reach over head   PARTICIPATION LIMITATIONS: driving   PERSONAL FACTORS: Age, Fitness, Time since onset of injury/illness/exacerbation, and 1-2 comorbidities: OP and previous lumpectomy surgery  are also affecting patient's functional outcome.    REHAB POTENTIAL: Good   CLINICAL DECISION MAKING: Stable/uncomplicated   EVALUATION COMPLEXITY: Low     GOALS: Goals reviewed with patient? Yes   SHORT TERM GOALS: Target date: 02/23/2023    Patient will be independent with initial HEP.  Baseline:  Goal status: MET (02/01/23)       LONG TERM GOALS: Target date: 03/23/2023  Patient will be independent with advanced/ongoing HEP to improve outcomes and carryover.  Baseline:  Goal status: INITIAL   2.  Patient will report 75% improvement in left neck and shoulder pain to improve QOL.  Baseline: 15% (02/01/23) Goal status: in progress    3.  Patient will demonstrate functional pain free cervical ROM for safety with driving.  Baseline:  Goal status: INITIAL   4.  Patient will  demonstrate improved posture to decrease muscle imbalance. Baseline:  Goal status: INITIAL   5.  Patient will report 52 on FOTO to demonstrate improved functional ability.  Baseline:  47 Goal status: INITIAL   6.  Patient will demonstrate improved active shoulder flexion to 150 deg or better without pain to ease OH ADLS.   Baseline:  Goal status: INITIAL    7. Patient will be able to sleep on Lt shoulder without waking from pain. Baseline: able to sleep on Lt side without pain (02/17/23) Goal status: MET     PLAN:   PT FREQUENCY: 2x/week   PT DURATION: 8 weeks   PLANNED INTERVENTIONS: Therapeutic exercises, Therapeutic activity, Neuromuscular re-education, Patient/Family education, Self Care, Joint mobilization, Dry Needling, Electrical stimulation, Cryotherapy, Moist heat, Taping, Ionotophoresis 4mg /ml Dexamethasone, and Manual therapy   PLAN FOR NEXT SESSION: manual therapy PRN,  continue postural strength and cervical/thoracic mobility   Sigurd Sos, PT 02/17/23 2:39 PM

## 2023-02-22 ENCOUNTER — Ambulatory Visit: Payer: Medicare Other

## 2023-02-24 ENCOUNTER — Ambulatory Visit: Payer: Medicare Other

## 2023-02-24 DIAGNOSIS — I872 Venous insufficiency (chronic) (peripheral): Secondary | ICD-10-CM | POA: Diagnosis not present

## 2023-02-24 DIAGNOSIS — I83891 Varicose veins of right lower extremities with other complications: Secondary | ICD-10-CM | POA: Diagnosis not present

## 2023-02-24 DIAGNOSIS — Z124 Encounter for screening for malignant neoplasm of cervix: Secondary | ICD-10-CM | POA: Diagnosis not present

## 2023-02-26 ENCOUNTER — Ambulatory Visit: Payer: Medicare Other

## 2023-03-01 DIAGNOSIS — I83891 Varicose veins of right lower extremities with other complications: Secondary | ICD-10-CM | POA: Diagnosis not present

## 2023-03-03 ENCOUNTER — Ambulatory Visit (INDEPENDENT_AMBULATORY_CARE_PROVIDER_SITE_OTHER): Payer: Medicare Other

## 2023-03-03 ENCOUNTER — Encounter: Payer: Self-pay | Admitting: Family Medicine

## 2023-03-03 ENCOUNTER — Ambulatory Visit (INDEPENDENT_AMBULATORY_CARE_PROVIDER_SITE_OTHER): Payer: Medicare Other | Admitting: Family Medicine

## 2023-03-03 VITALS — BP 138/80 | HR 80 | Temp 98.1°F | Wt 183.0 lb

## 2023-03-03 DIAGNOSIS — M25552 Pain in left hip: Secondary | ICD-10-CM

## 2023-03-03 DIAGNOSIS — M25551 Pain in right hip: Secondary | ICD-10-CM | POA: Diagnosis not present

## 2023-03-03 DIAGNOSIS — M818 Other osteoporosis without current pathological fracture: Secondary | ICD-10-CM

## 2023-03-03 MED ORDER — DENOSUMAB 60 MG/ML ~~LOC~~ SOSY
60.0000 mg | PREFILLED_SYRINGE | Freq: Once | SUBCUTANEOUS | Status: AC
  Administered 2023-03-03: 60 mg via SUBCUTANEOUS

## 2023-03-03 NOTE — Progress Notes (Signed)
   Subjective:    Patient ID: Shelly [name], female    DOB: [birth year], 77 y.o.   MRN: [id number]  HPI Here for pain and stiffness in both hips, the right being worse than the left. This started a few years ago, but recently it has gotten worse. She is treating it with Tylenol. She has a hx of low back pain with sciatica, but she says this has not bothered her for several years. No hx of trauma.    Review of Systems  Constitutional: Negative.   Respiratory: Negative.    Cardiovascular: Negative.   Musculoskeletal:  Positive for arthralgias.       Objective:   Physical Exam Constitutional:      Appearance: Normal appearance.     Comments: She gets on the exam table easily   Cardiovascular:     Rate and Rhythm: Normal rate and regular rhythm.     Pulses: Normal pulses.     Heart sounds: Normal heart sounds.  Pulmonary:     Effort: Pulmonary effort is normal.     Breath sounds: Normal breath sounds.  Musculoskeletal:     Comments: There is no tenderness over the spine or the sciatic notches or the hips. ROM of the spine is full. ROM of both hips is full, but internal and external rotation causes pain   Neurological:     Mental Status: She is alert.           Assessment & Plan:  Bilateral hip pain, likely from OA. We will get Xrays of both hips today. She will continue to take Tylenol as needed.  Gershon Crane, MD

## 2023-03-03 NOTE — Addendum Note (Signed)
Addended by: Carola Rhine on: 03/03/2023 11:46 AM   Modules accepted: Orders

## 2023-03-04 ENCOUNTER — Ambulatory Visit: Payer: Medicare Other

## 2023-03-04 ENCOUNTER — Ambulatory Visit: Payer: Medicare Other | Admitting: Physical Therapy

## 2023-03-04 DIAGNOSIS — Z09 Encounter for follow-up examination after completed treatment for conditions other than malignant neoplasm: Secondary | ICD-10-CM | POA: Diagnosis not present

## 2023-03-04 DIAGNOSIS — I83891 Varicose veins of right lower extremities with other complications: Secondary | ICD-10-CM | POA: Diagnosis not present

## 2023-03-10 ENCOUNTER — Other Ambulatory Visit: Payer: Self-pay | Admitting: Family Medicine

## 2023-03-10 DIAGNOSIS — I83892 Varicose veins of left lower extremities with other complications: Secondary | ICD-10-CM | POA: Diagnosis not present

## 2023-03-11 ENCOUNTER — Ambulatory Visit: Payer: Medicare Other | Attending: Family | Admitting: Physical Therapy

## 2023-03-11 ENCOUNTER — Other Ambulatory Visit: Payer: Self-pay

## 2023-03-11 DIAGNOSIS — R279 Unspecified lack of coordination: Secondary | ICD-10-CM | POA: Insufficient documentation

## 2023-03-11 DIAGNOSIS — M6281 Muscle weakness (generalized): Secondary | ICD-10-CM | POA: Insufficient documentation

## 2023-03-11 DIAGNOSIS — R293 Abnormal posture: Secondary | ICD-10-CM | POA: Insufficient documentation

## 2023-03-11 NOTE — Therapy (Signed)
OUTPATIENT PHYSICAL THERAPY FEMALE PELVIC EVALUATION   Patient Name: Shelly [name] MRN: [id number] DOB:03/25/1946, 77 y.o., female Today's Date: 03/11/2023  END OF SESSION:  PT End of Session - 03/11/23 1222     Visit Number 1   8 TOTAL WITH ORTHO   Date for PT Re-Evaluation 06/10/23    Authorization Type MCR    Progress Note Due on Visit 10    PT Start Time 1225    PT Stop Time 1303    PT Time Calculation (min) 38 min    Activity Tolerance Patient tolerated treatment well    Behavior During Therapy WFL for tasks assessed/performed             Past Medical History:  Diagnosis Date   Allergy    Arthritis    wrist, shoulder    Cancer 08/2019   left breast IDC   Diverticulitis    Endometrial polyp    Family history of breast cancer    GERD (gastroesophageal reflux disease)    occasional - diet controlled   Hx of adenomatous colonic polyps    Hyperlipidemia    IBS (irritable bowel syndrome)    Missed abortion    x 1 - resolved - no surgery   Osteoporosis    SVD (spontaneous vaginal delivery)    x 1   Transient vision disturbance 2019   was evaluated by ED, no findings   Vitamin D deficiency    Past Surgical History:  Procedure Laterality Date   BREAST EXCISIONAL BIOPSY Right 08/2019   Benign, lesion    BREAST LUMPECTOMY Left 09/2019   BREAST LUMPECTOMY WITH RADIOACTIVE SEED AND SENTINEL LYMPH NODE BIOPSY Left 10/06/2019   Procedure: LEFT BREAST LUMPECTOMY  X2 WITH RADIOACTIVE SEED X2  AND SENTINEL LYMPH NODE BIOPSY;  Surgeon: Griselda Miner, MD;  Location: [facility];  Service: General;  Laterality: Left;   BREAST LUMPECTOMY WITH RADIOACTIVE SEED LOCALIZATION Right 10/06/2019   Procedure: RIGHT BREAST LUMPECTOMY WITH RADIOACTIVE SEED LOCALIZATION;  Surgeon: Griselda Miner, MD;  Location: [facility];  Service: General;  Laterality: Right;   BRONCHIAL BIOPSY  12/27/2020   Procedure: BRONCHIAL BIOPSIES;  Surgeon: Josephine Igo, DO;  Location: [facility];  Service: Pulmonary;;   BRONCHIAL BRUSHINGS  12/27/2020   Procedure: BRONCHIAL BRUSHINGS;  Surgeon: Josephine Igo, DO;  Location: [facility];  Service: Pulmonary;;   BRONCHIAL NEEDLE ASPIRATION BIOPSY  12/27/2020   Procedure: BRONCHIAL NEEDLE ASPIRATION BIOPSIES;  Surgeon: Josephine Igo, DO;  Location: [facility];  Service: Pulmonary;;   BRONCHIAL WASHINGS  12/27/2020   Procedure: BRONCHIAL WASHINGS;  Surgeon: Josephine Igo, DO;  Location: [facility];  Service: Pulmonary;;   COLONOSCOPY     DIAGNOSTIC LAPAROSCOPY     cysts   DILATATION & CURETTAGE/HYSTEROSCOPY WITH TRUECLEAR N/A 10/31/2013   Procedure: DILATATION & CURETTAGE/HYSTEROSCOPY WITH TRUCLEAR;  Surgeon: Zelphia Cairo, MD;  Location: [facility];  Service: Gynecology;  Laterality: N/A;   DILATION AND CURETTAGE OF UTERUS     MASS EXCISION Left 11/06/2019   Procedure: EXCISION NIPPLE AND AREOLA  MARGIN LEFT BREAST;  Surgeon: Griselda Miner, MD;  [facility];  Service: General;  Laterality: Left;   TONSILLECTOMY  1952   VIDEO BRONCHOSCOPY WITH ENDOBRONCHIAL NAVIGATION Bilateral 12/27/2020   Procedure: VIDEO BRONCHOSCOPY WITH ENDOBRONCHIAL NAVIGATION;  Surgeon: Josephine Igo, DO;  Location: [facility];  Service: Pulmonary;  Laterality: Bilateral;   VIDEO BRONCHOSCOPY WITH ENDOBRONCHIAL ULTRASOUND  Bilateral 12/27/2020   Procedure: VIDEO BRONCHOSCOPY WITH ENDOBRONCHIAL ULTRASOUND;  Surgeon: Josephine Igo, DO;  Location: [facility];  Service: Pulmonary;  Laterality: Bilateral;   WISDOM TOOTH EXTRACTION     Patient Active Problem List   Diagnosis Date Noted   Urinary frequency 04/12/2022   Acute bilateral low back pain without sciatica 04/12/2022   Essential hypertension 01/14/2022   Lung mass    Lesion of lung 12/04/2020   History of adenomatous polyp of colon 08/14/2020   Family history of breast cancer    Cancer of central portion of left female breast  09/18/2019   Prediabetes 04/06/2017   Osteoporosis 06/12/2015   IBS (irritable bowel syndrome) 08/21/2014   Hyperlipidemia 08/05/2011   ABDOMINAL PAIN RIGHT UPPER QUADRANT 10/30/2008   ABDOMINAL BLOATING 10/25/2008   GERD 06/04/2008   DIVERTICULOSIS OF COLON 06/04/2008    PCP: Kristian Covey, MD  REFERRING PROVIDER: Randa Spike, FNP   REFERRING DIAG: N81.10 (ICD-10-CM) - Cystocele, unspecified  THERAPY DIAG:  Muscle weakness (generalized)  Unspecified lack of coordination  Abnormal posture  Rationale for Evaluation and Treatment: Rehabilitation  ONSET DATE: recently had been told she had a cystocele at recent gyn appt  SUBJECTIVE:                                                                                                                                                                                           SUBJECTIVE STATEMENT: Pt reports she wants to learn how to pelvic floor contractions properly to prevent her bladder dropping  Pt does not want to do internal.  Water - 3 glasses  PAIN:  Are you having pain? No  PRECAUTIONS: None  WEIGHT BEARING RESTRICTIONS: No  FALLS:  Has patient fallen in last 6 months? No  LIVING ENVIRONMENT: Lives with: lives with their family Lives in: House/apartment   OCCUPATION: RETIRED  PLOF: Independent  PATIENT GOALS:   PERTINENT HISTORY:  Patient was diagnosed with left sided breast cancer and underwent left lumpectomy with Dr. Carolynne Edouard and is s/p XRT x 30. 2021 osteopenia  Sexual abuse: No  BOWEL MOVEMENT: Pain with bowel movement: No - does have IBS made worse with stress and diary Type of bowel movement:Type (Bristol Stool Scale) 4, Frequency daily/every other, and Strain No Fully empty rectum: Yes:   Leakage: No Pads: No Fiber supplement: No  URINATION: Pain with urination: No Fully empty bladder: Yes:   Stream: Strong Urgency: Yes: sometimes has urge but then needs to take a second and relax  then able to go Frequency: not quicker than every 2 hours, x4 a night however its not a  urge that wakes her but is awake and then goes to urinate.   Leakage:  none Pads: No  INTERCOURSE: Pain with intercourse: After Intercourse - irritation noted afterward and does report dryness, uses lubricant during Ability to have vaginal penetration:  Yes:   Climax: not painful Marinoff Scale: 0/3  PREGNANCY: Vaginal deliveries 1 Tearing Yes: unsure grade C-section deliveries 0 Currently pregnant No  PROLAPSE: Cystocele per referral and pt   OBJECTIVE:   DIAGNOSTIC FINDINGS:    COGNITION: Overall cognitive status: Within functional limits for tasks assessed     SENSATION: Light touch: Appears intact Proprioception: Appears intact  MUSCLE LENGTH: Bil hamstrings and adductors limited by 25%   POSTURE: rounded shoulders, forward head, and posterior pelvic tilt   LUMBARAROM/PROM:  A/PROM A/PROM  eval  Flexion Limited by 25%  Extension WFL  Right lateral flexion WFL  Left lateral flexion WFL  Right rotation Limited by 25%  Left rotation Limited by 25%   (Blank rows = not tested)  LOWER EXTREMITY ROM:  WFL  LOWER EXTREMITY MMT:  Bil hips grossly 4/5, knees 5/5 PALPATION:   General  no TTP                External Perineal Exam pt deferred                              Internal Pelvic Floor pt deferred   Patient confirms identification and approves PT to assess internal pelvic floor and treatment No  PELVIC MMT:   MMT eval  Vaginal   Internal Anal Sphincter   External Anal Sphincter   Puborectalis   Diastasis Recti   (Blank rows = not tested)        TONE: pt deferred   PROLAPSE: pt deferred  TODAY'S TREATMENT:                                                                                                                              DATE:   03/11/23 Examination completed, findings reviewed, pt educated on POC, HEP, and handout on feminine  moisturizers. Pt motivated to participate in PT and agreeable to attempt recommendations.     PATIENT EDUCATION:  Education details: [email or web address] Person educated: Patient Education method: Explanation Education comprehension: verbalized understanding and returned demonstration  HOME EXERCISE PROGRAM: [email or web address]  ASSESSMENT:  CLINICAL IMPRESSION: Patient is a 77 y.o. female  who was seen today for physical therapy evaluation and treatment for cystocele. Pt reports she does not want to do internal and wants to make sure she is doing pelvic floor contractions correctly. PT educated on internal assessment to see if she is doing them correctly but reports she wants to see if she can do them at home first then will return if not. Pt findings above, educated on HEP and pt denied further follow up for PT at this time and would like to call back  if treatment needed later.    OBJECTIVE IMPAIRMENTS: decreased strength and postural dysfunction.   ACTIVITY LIMITATIONS:  none per pt  PARTICIPATION LIMITATIONS: interpersonal relationship  PERSONAL FACTORS: Time since onset of injury/illness/exacerbation are also affecting patient's functional outcome.   REHAB POTENTIAL: Good  CLINICAL DECISION MAKING: Stable/uncomplicated  EVALUATION COMPLEXITY: Low   GOALS: Goals reviewed with patient? Yes  SHORT TERM GOALS: Target date: 04/08/23  Pt to be I with HEP.  Baseline: Goal status: INITIAL    LONG TERM GOALS: Target date: 06/10/23  Pt to be I with advanced HEP.  Baseline:  Goal status: INITIAL  2. Pt to demonstrate improved coordination of pelvic floor and breathing mechanics with 10# squat with appropriate synergistic patterns to decrease strain at pelvic floor . Baseline:  Goal status: INITIAL  3.  Pt to demonstrate at least 4/5 pelvic floor strength for improved pelvic stability and decreased strain at pelvic floor. Baseline:  Goal status: INITIAL   PLAN:  PT FREQUENCY: every other  week  PT DURATION:  4 sessions  PLANNED INTERVENTIONS: Therapeutic exercises, Therapeutic activity, Neuromuscular re-education, Balance training, Patient/Family education, Self Care, Spinal mobilization, Cryotherapy, Moist heat, Biofeedback, and Manual therapy  PLAN FOR NEXT SESSION: internal if pt consents, coordination of pelvic floor and breathing, lifting mechanics  Otelia Sergeant, PT, DPT 04/25/241:05 PM

## 2023-03-11 NOTE — Patient Instructions (Signed)

## 2023-03-18 ENCOUNTER — Ambulatory Visit: Payer: Medicare Other | Attending: Family Medicine

## 2023-03-18 DIAGNOSIS — M542 Cervicalgia: Secondary | ICD-10-CM | POA: Insufficient documentation

## 2023-03-18 DIAGNOSIS — R252 Cramp and spasm: Secondary | ICD-10-CM | POA: Insufficient documentation

## 2023-03-18 DIAGNOSIS — M25512 Pain in left shoulder: Secondary | ICD-10-CM

## 2023-03-18 DIAGNOSIS — M6281 Muscle weakness (generalized): Secondary | ICD-10-CM | POA: Diagnosis not present

## 2023-03-18 DIAGNOSIS — R293 Abnormal posture: Secondary | ICD-10-CM | POA: Insufficient documentation

## 2023-03-18 NOTE — Therapy (Signed)
OUTPATIENT PHYSICAL THERAPY TREATMENT NOTE   Patient Name: Shelly [name] MRN: [id number] DOB:[birth year], 77 y.o., female Today's Date: 03/18/2023  PCP: Kristian Covey, MD REFERRING PROVIDER: Kristian Covey, MD  END OF SESSION:   PT End of Session - 03/18/23 4098     Visit Number 9   1 pelvic, 8 ortho)   Date for PT Re-Evaluation 06/10/23    Authorization Type MCR    Progress Note Due on Visit 10    PT Start Time 0847    PT Stop Time 0928    PT Time Calculation (min) 41 min    Activity Tolerance Patient tolerated treatment well    Behavior During Therapy [facility] for tasks assessed/performed                   Past Medical History:  Diagnosis Date   Allergy    Arthritis    wrist, shoulder    Cancer (HCC) 08/2019   left breast IDC   Diverticulitis    Endometrial polyp    Family history of breast cancer    GERD (gastroesophageal reflux disease)    occasional - diet controlled   Hx of adenomatous colonic polyps    Hyperlipidemia    IBS (irritable bowel syndrome)    Missed abortion    x 1 - resolved - no surgery   Osteoporosis    SVD (spontaneous vaginal delivery)    x 1   Transient vision disturbance 2019   was evaluated by ED, no findings   Vitamin D deficiency    Past Surgical History:  Procedure Laterality Date   BREAST EXCISIONAL BIOPSY Right 08/2019   Benign, lesion    BREAST LUMPECTOMY Left 09/2019   BREAST LUMPECTOMY WITH RADIOACTIVE SEED AND SENTINEL LYMPH NODE BIOPSY Left 10/06/2019   Procedure: LEFT BREAST LUMPECTOMY  X2 WITH RADIOACTIVE SEED X2  AND SENTINEL LYMPH NODE BIOPSY;  Surgeon: Griselda Miner, MD;  Location: [facility];  Service: General;  Laterality: Left;   BREAST LUMPECTOMY WITH RADIOACTIVE SEED LOCALIZATION Right 10/06/2019   Procedure: RIGHT BREAST LUMPECTOMY WITH RADIOACTIVE SEED LOCALIZATION;  Surgeon: Griselda Miner, MD;  Location: [facility];  Service: General;  Laterality: Right;    BRONCHIAL BIOPSY  12/27/2020   Procedure: BRONCHIAL BIOPSIES;  Surgeon: Josephine Igo, DO;  Location: [facility];  Service: Pulmonary;;   BRONCHIAL BRUSHINGS  12/27/2020   Procedure: BRONCHIAL BRUSHINGS;  Surgeon: Josephine Igo, DO;  Location: [facility];  Service: Pulmonary;;   BRONCHIAL NEEDLE ASPIRATION BIOPSY  12/27/2020   Procedure: BRONCHIAL NEEDLE ASPIRATION BIOPSIES;  Surgeon: Josephine Igo, DO;  Location: [facility];  Service: Pulmonary;;   BRONCHIAL WASHINGS  12/27/2020   Procedure: BRONCHIAL WASHINGS;  Surgeon: Josephine Igo, DO;  Location: [facility];  Service: Pulmonary;;   COLONOSCOPY     DIAGNOSTIC LAPAROSCOPY     cysts   DILATATION & CURETTAGE/HYSTEROSCOPY WITH TRUECLEAR N/A 10/31/2013   Procedure: DILATATION & CURETTAGE/HYSTEROSCOPY WITH TRUCLEAR;  Surgeon: Zelphia Cairo, MD;  Location: [facility];  Service: Gynecology;  Laterality: N/A;   DILATION AND CURETTAGE OF UTERUS     MASS EXCISION Left 11/06/2019   Procedure: EXCISION NIPPLE AND AREOLA  MARGIN LEFT BREAST;  Surgeon: Griselda Miner, MD;  Location: [facility];  Service: General;  Laterality: Left;   TONSILLECTOMY  1952   VIDEO BRONCHOSCOPY WITH ENDOBRONCHIAL NAVIGATION Bilateral 12/27/2020   Procedure: VIDEO BRONCHOSCOPY WITH ENDOBRONCHIAL NAVIGATION;  Surgeon: Audie Box  L, DO;  Location: [facility];  Service: Pulmonary;  Laterality: Bilateral;   VIDEO BRONCHOSCOPY WITH ENDOBRONCHIAL ULTRASOUND Bilateral 12/27/2020   Procedure: VIDEO BRONCHOSCOPY WITH ENDOBRONCHIAL ULTRASOUND;  Surgeon: Josephine Igo, DO;  Location: [facility];  Service: Pulmonary;  Laterality: Bilateral;   WISDOM TOOTH EXTRACTION     Patient Active Problem List   Diagnosis Date Noted   Urinary frequency 04/12/2022   Acute bilateral low back pain without sciatica 04/12/2022   Essential hypertension 01/14/2022   Lung mass    Lesion of lung 12/04/2020   History of adenomatous polyp of colon 08/14/2020    Family history of breast cancer    Cancer of central portion of left female breast (HCC) 09/18/2019   Prediabetes 04/06/2017   Osteoporosis 06/12/2015   IBS (irritable bowel syndrome) 08/21/2014   Hyperlipidemia 08/05/2011   ABDOMINAL PAIN RIGHT UPPER QUADRANT 10/30/2008   ABDOMINAL BLOATING 10/25/2008   GERD 06/04/2008   DIVERTICULOSIS OF COLON 06/04/2008    REFERRING DIAG: M54.9 (ICD-10-CM) - Upper back pain on left side  THERAPY DIAG:  Cervicalgia - Plan: PT plan of care cert/re-cert  Acute pain of left shoulder - Plan: PT plan of care cert/re-cert  Cramp and spasm - Plan: PT plan of care cert/re-cert  Rationale for Evaluation and Treatment Rehabilitation  PERTINENT HISTORY: L breast CA lumpectomy 2020, OP  PRECAUTIONS: osteoporosis  SUBJECTIVE:                                                                                                                                                                   SUBJECTIVE STATEMENT:  I am 75% better overall.  I haven't been here because I was having vein surgery.     PAIN:  Are you having pain? Yes: NPRS scale: 0-4/10 Pain location: left UT, neck and shoulder Pain description: strain, pulling Aggravating factors: turning R>L, sleeping on shoulder Relieving factors: tylenol   OBJECTIVE:  DIAGNOSTIC FINDINGS:  Arthritis in neck per pt (xray from [facility].   PATIENT SURVEYS:  FOTO 47 (predicted 60) 03/18/23: 61 (goal met)   COGNITION: Overall cognitive status: Within functional limits for tasks assessed   SENSATION: WFL   POSTURE: rounded shoulders, forward head, and increased thoracic kyphosis   PALPATION: Palpation: TTP at L UT, LS, lats, subscap, pects. Increased tissue tension in L UT, LS Spinal Mobility:WFL     CERVICAL ROM: *pain   Active ROM A/PROM (deg) eval A/ROM 02/15/23 A/ROM 03/18/23  Flexion 15* 50 55  Extension 10    Right lateral flexion 26* 32 30  Left lateral flexion 38 40 50  Right  rotation 38 55 55  Left rotation 27* 40 50   (Blank rows = not tested)   UPPER EXTREMITY ROM:   A/P  ROM Right eval Left eval Left  03/18/23  Shoulder flexion   143/150 145  Shoulder extension   FULL   Shoulder abduction   115/129   Shoulder adduction       Shoulder extension       Shoulder internal rotation   FULL   Shoulder external rotation   55/68    (Blank rows = not tested)   UPPER EXTREMITY MMT: L shoulder ABD 4/5 with pain, else 5/5; L mid trap 4-/5, low trap 4+/5; R mid trap 4+, low trap 4-/5   SHOULDER SPECIAL TESTS: Positive HK, neg Impingement   LUMBAR ROM; WFL - tight HS limiting flexion, tight in rotation bil 25%     TODAY'S TREATMENT:         03/18/23: Arm bike: level 1.5 x 6 minutes (3/3)-PT present to discuss progress Bilat wall slide shoulder flexion x 10 with 5" hold  3 way raises: 1# added-2x10 bil each Seated UT/ Levator  stretch 2x30 sec Manual: elongation and release to Lt upper trap and neck  02/17/23: NuStep L5 x 8' PT present to discuss plan for session Bilat wall slide shoulder flexion x 10 Lt pec stretch in doorway 3x10 seconds  Standing bil UE row and ext red tband 2x10         Ball roll outs: forward and lateral x5 each with 10" hold  Seated UT/ Levator  stretch 2x30 sec Manual: elongation and release to Lt upper trap and neck            02/15/23: Cervical A/ROM 3 ways: 3x20 seconds  NuStep: Level 3x 7 minutes- PT present to discuss Ball roll outs 3 ways Trigger Point Dry-Needling  Treatment instructions: Expect mild to moderate muscle soreness. S/S of pneumothorax if dry needled over a lung field, and to seek immediate medical attention should they occur. Patient verbalized understanding of these instructions and education.  Patient Consent Given: Yes Education handout provided: Previously provided Muscles treated: Lt upper trap, bil cervical multifidi Treatment response/outcome: Utilized skilled palpation to identify trigger points.  During  dry needling able to palpate muscle twitch and muscle elongation  Elongation and release to neck after DN Skilled palpation and monitoring by PT during dry needling                                                                                                                     PATIENT EDUCATION:  Education details: PT eval findings, anticipated POC, initial HEP, and postural awareness   Person educated: Patient Education method: Explanation, Demonstration, and Handouts Education comprehension: verbalized understanding and returned demonstration   HOME EXERCISE PROGRAM: Access Code: [email or web address] URL: [email or web address] Date: 01/28/2023 Prepared by: Loistine Simas Beuhring  Exercises - Supine Chin Tuck  - 1 x daily - 7 x weekly - 3 sets - 10 reps - Single Arm Doorway Pec Stretch at 90 Degrees Abduction  - 2 x daily - 7 x weekly - 1 sets - 3 reps - 30 sec hold -  Standing Single Shoulder Flexion Wall Slide with Palm Up  - 2 x daily - 7 x weekly - 1 sets - 10 reps - Seated Gentle Upper Trapezius Stretch (Mirrored)  - 2 x daily - 7 x weekly - 1 sets - 3 reps - 30 sec hold - Gentle Levator Scapulae Stretch  - 2 x daily - 7 x weekly - 1 sets - 3 reps - 30 sec hold - Standing Row with Anchored Resistance  - 1 x daily - 7 x weekly - 2 sets - 10 reps - Shoulder extension with resistance - Neutral  - 1 x daily - 7 x weekly - 2 sets - 10 reps - Supine Shoulder Horizontal Abduction with Resistance  - 1 x daily - 7 x weekly - 2 sets - 10 reps - Standing Shoulder Horizontal Abduction with Resistance  - 1 x daily - 7 x weekly - 2 sets - 10 reps - Shoulder External Rotation and Scapular Retraction with Resistance  - 1 x daily - 7 x weekly - 2 sets - 10 reps - Sidelying Thoracic Rotation with Open Book  - 1 x daily - 7 x weekly - 2 sets - 10 reps - Seated Shoulder External Rotation AAROM with Cane and Hand in Neutral  - 1 x daily - 7 x weekly - 2 sets - 10 reps   ASSESSMENT:   CLINICAL  IMPRESSION: Lapse in treatment due to vein surgery.  Pt reports 75% overall improvement since the start of care and reports continued neck pain and stiffness with functional activities.  Pt with improved cervical A/ROM overall although still has pain and stiffness at end range. Pt with tension and trigger points in Lt upper trap and levator and repsponded well to manual therapy.   Patient will benefit from skilled PT to address the below impairments and improve overall function.    OBJECTIVE IMPAIRMENTS: decreased ROM, decreased strength, increased muscle spasms, impaired flexibility, impaired UE functional use, postural dysfunction, and pain.    ACTIVITY LIMITATIONS: sleeping and reach over head   PARTICIPATION LIMITATIONS: driving   PERSONAL FACTORS: Age, Fitness, Time since onset of injury/illness/exacerbation, and 1-2 comorbidities: OP and previous lumpectomy surgery  are also affecting patient's functional outcome.    REHAB POTENTIAL: Good   CLINICAL DECISION MAKING: Stable/uncomplicated   EVALUATION COMPLEXITY: Low     GOALS: Goals reviewed with patient? Yes   SHORT TERM GOALS: Target date: 02/23/2023    Patient will be independent with initial HEP.  Baseline:  Goal status: MET (02/01/23)       LONG TERM GOALS: Target date: 04/30/23 Patient will be independent with advanced/ongoing HEP to improve outcomes and carryover.  Baseline:  further advancement is needed (03/18/23) Goal status: In progress    2.  Patient will report 90% improvement in left neck and shoulder pain to improve QOL.  Baseline: 75% (03/18/23) Goal status: revised    3.  Patient will demonstrate functional pain free cervical ROM for safety with driving.  Baseline: see above  Goal status: In progress    4.  Patient will demonstrate improved posture to decrease muscle imbalance. Baseline: difficulty maintaining posture due to functional weakness (03/18/23) Goal status: INITIAL   5.  Patient will report 65 on  FOTO to demonstrate improved functional ability.  Baseline: 61 (03/18/23) Goal status: MET   6.  Patient will demonstrate improved active shoulder flexion to 150 deg or better without pain to ease OH ADLS.   Baseline: 145 (03/18/23)  Goal status: In progress    7. Patient will be able to sleep on Lt shoulder without waking from pain. Baseline: able to sleep on Lt side without pain (02/17/23) Goal status: MET     PLAN:   PT FREQUENCY: 2x/week   PT DURATION: 6 weeks   PLANNED INTERVENTIONS: Therapeutic exercises, Therapeutic activity, Neuromuscular re-education, Patient/Family education, Self Care, Joint mobilization, Dry Needling, Electrical stimulation, Cryotherapy, Moist heat, Taping, Ionotophoresis 4mg /ml Dexamethasone, and Manual therapy   PLAN FOR NEXT SESSION: manual therapy PRN,  continue postural strength and cervical/thoracic mobility   Lorrene Reid, PT 03/18/23 9:32 AM

## 2023-03-22 ENCOUNTER — Other Ambulatory Visit: Payer: Self-pay

## 2023-03-22 DIAGNOSIS — Z170 Estrogen receptor positive status [ER+]: Secondary | ICD-10-CM

## 2023-03-22 NOTE — Progress Notes (Unsigned)
Patient Care Team: Kristian Covey, MD as PCP - General (Family Medicine) Antonietta Barcelona, OD as Referring Physician (Optometry) Griselda Miner, MD as Consulting Physician (General Surgery) Pershing Proud, RN as Oncology Nurse Navigator Donnelly Angelica, RN as Oncology Nurse Navigator Malachy Mood, MD as Consulting Physician (Hematology) Pollyann Samples, NP as Nurse Practitioner (Nurse Practitioner) Lonie Peak, MD as Attending Physician (Radiation Oncology)   CHIEF COMPLAINT: Follow up left breast cancer   Oncology History Overview Note  Cancer Staging Cancer of central portion of left female breast [facility]) Staging form: Breast, AJCC 8th Edition - Clinical stage from 09/18/2019: Stage IA (cT1b, cN0, cM0, G2, ER+, PR+, HER2-) - Signed by Malachy Mood, MD on 09/18/2019 - Pathologic stage from 10/06/2019: Stage IA (pT1c, pN1a, cM0, G2, ER+, PR+, HER2-) - Signed by Malachy Mood, MD on 10/19/2019    Cancer of central portion of left female breast (HCC)  08/24/2019 Mammogram   Diagnostic mammogram 08/24/19  IMPRESSION: 1. Left breast mass measuring 0.8 x 0.4 x 0.5 cm at the 1 o'clock retroareolar region is suspicious and likely corresponds to the area of distortion seen on mammography.   2. Left breast 3 o'clock retroareolar dilated duct with internal debris versus solid mass.   3. Right breast retroareolar focal asymmetry without sonographic correlate.   08/28/2019 Initial Biopsy   Final Diagnosis 08/28/19 1. Breast, left, needle core biopsy, 1 o'clock/retroareolar - INVASIVE DUCTAL CARCINOMA, GRADE II. - PERINEURAL INVASION. - SEE MICROSCOPIC DESCRIPTION. 2. Breast, left, needle core biopsy, 3 o'clock retroareolar - DUCTAL PAPILLOMA. - SEE MICROSCOPIC DESCRIPTION.   08/28/2019 Receptors her2   1. PROGNOSTIC INDICATORS 08/28/19 Results: IMMUNOHISTOCHEMICAL AND MORPHOMETRIC ANALYSIS PERFORMED MANUALLY The tumor cells are NEGATIVE for Her2 (0). Estrogen Receptor: 100%,  POSITIVE, STRONG STAINING INTENSITY Progesterone Receptor: 50%, POSITIVE, STRONG STAINING INTENSITY Proliferation Marker Ki67: 12%   09/01/2019 Pathology Results   Diagnosis 09/01/19 Breast, right, needle core biopsy, retroareolar - COMPLEX SCLEROSING LESION WITH A PAPILLARY SCLEROSING LESION AND USUAL DUCTAL HYPERPLASIA. SEE NOTE - NEGATIVE FOR CARCINOMA   09/18/2019 Initial Diagnosis   Cancer of central portion of left female breast (HCC)   09/18/2019 Cancer Staging   Staging form: Breast, AJCC 8th Edition - Clinical stage from 09/18/2019: Stage IA (cT1b, cN0, cM0, G2, ER+, PR+, HER2-) - Signed by Malachy Mood, MD on 09/18/2019   10/06/2019 Surgery   LEFT BREAST LUMPECTOMY  X2 WITH RADIOACTIVE SEED X2  AND SENTINEL LYMPH NODE BIOPSY and  RIGHT BREAST LUMPECTOMY WITH RADIOACTIVE SEED LOCALIZATION by Dr Carolynne Edouard  10/06/19    10/06/2019 Pathology Results   FINAL MICROSCOPIC DIAGNOSIS: 10/06/19   A. BREAST, RIGHT, LUMPECTOMY:  - Fibrocystic change with a small incidental intraductal papilloma and  usual ductal hyperplasia  - Biopsy site changes  - Negative for carcinoma   B. LYMPH NODE, LEFT #1, SENTINEL, BIOPSY:  - Metastatic carcinoma to a lymph node (1/1)  - Focus of metastatic carcinoma measures 2.5 mm and shows extranodal  extension   C. LYMPH NODE, LEFT #2, SENTINEL, BIOPSY:  - Lymph node, negative for carcinoma (0/1)   D. LYMPH NODE, LEFT #3, SENTINEL, BIOPSY:  - Lymph node, negative for carcinoma (0/1)   E. BREAST, LEFT, LUMPECTOMY:  - Invasive ductal carcinoma, grade 2, 1.8 cm  - Ductal carcinoma in situ, intermediate grade  - Invasive carcinoma focally involves the anterior margin  - DCIS is less than 1 mm from the superior margin  - Background breast parenchyma with fibrocystic  change, usual ductal  hyperplasia and intraductal papilloma.  - Biopsy site changes  - See oncology table    10/06/2019 Miscellaneous   Her Mammaprint showed High Risk Luminal Type B, risk  of recurrence in the next 10 years is 29% in high risk disease. MPI -0.335    10/06/2019 Cancer Staging   Staging form: Breast, AJCC 8th Edition - Pathologic stage from 10/06/2019: Stage IA (pT1c, pN1a, cM0, G2, ER+, PR+, HER2-) - Signed by Malachy Mood, MD on 10/19/2019   11/06/2019 Surgery   EXCISION NIPPLE AND AREOLA  MARGIN LEFT BREAST by Dr Carolynne Edouard  11/06/19    11/06/2019 Pathology Results    FINAL MICROSCOPIC DIAGNOSIS: 11/06/19  A. BREAST, LEFT CENTRAL, RE-EXCISION:  -  Residual ductal carcinoma in situ, intermediate grade  -  Margins uninvolved by carcinoma (0.4 cm; superior margin)  -  Atypical intraductal papilloma  -  Usual ductal hyperplasia  -  Benign skin with seborrheic keratosis  -  Previous surgical site changes  -  No invasive carcinoma identified    12/06/2019 - 12/20/2019 Chemotherapy   Adjuvant chemo TC q3weeks for 4 cycles starting 12/06/19. With first TC infusion she had allergy reaction to docetaxel and Ledell Noss was not given on 12/06/19, we discontinued.   -I started her on weekly Abraxane on 12/20/19, but 2 days after infusion she started to feel tachycardic, hot/flushed, and lightheaded. A few days later she had dysuria, diarrhea and abdominal cramps and intermittent chest pain. She opted to d/c chemo and proceed with RT.    01/25/2020 - 03/06/2020 Radiation Therapy   Adjuvant Radiation with Dr Basilio Cairo 01/25/20-03/06/20   05/2020 - 07/2020 Anti-estrogen oral therapy   I started her on antiestrogen therapy with Tamoxifen in 03/2020. She took until 06/06/20 and 10mg  06/04/20- 06/2020 but stopped due to exacerbated IBS symptoms. She also tried 2 weeks of Exemestane in 07/2020 but stopped due to increased Anxiety.   06/06/2020 Survivorship   Care plan delivered by Santiago Glad, NP    12/04/2020 Imaging   CT chest  IMPRESSION: 1. Marked interval worsening of parenchymal disease now involving areas of the LEFT lower lobe, lingula, RIGHT upper lobe and with nodular opacity in the RIGHT  lower lobe. Findings could be seen in the setting of COVID-19 infection. Pattern of disease also raises the question of organizing pneumonia. Pneumonitis, drug related changes are also considered. Pattern of disease would be atypical for neoplasm. Pulmonary consultation is suggested if not yet performed. Continued short interval surveillance is also suggested. 2. Post LEFT breast lumpectomy and axillary dissection. 3. Calcified coronary artery disease in LEFT and RIGHT coronary circulation. 4. Stable LEFT adrenal nodule compatible with benign adenoma. 5. Aortic atherosclerosis.   Aortic Atherosclerosis (ICD10-I70.0).      CURRENT THERAPY: Surveillance   INTERVAL HISTORY Ms. Watwood returns for follow up as scheduled. Last seen by me 09/21/22.   ROS   Past Medical History:  Diagnosis Date   Allergy    Arthritis    wrist, shoulder    Cancer (HCC) 08/2019   left breast IDC   Diverticulitis    Endometrial polyp    Family history of breast cancer    GERD (gastroesophageal reflux disease)    occasional - diet controlled   Hx of adenomatous colonic polyps    Hyperlipidemia    IBS (irritable bowel syndrome)    Missed abortion    x 1 - resolved - no surgery   Osteoporosis    SVD (spontaneous vaginal delivery)  x 1   Transient vision disturbance 2019   was evaluated by ED, no findings   Vitamin D deficiency      Past Surgical History:  Procedure Laterality Date   BREAST EXCISIONAL BIOPSY Right 08/2019   Benign, lesion    BREAST LUMPECTOMY Left 09/2019   BREAST LUMPECTOMY WITH RADIOACTIVE SEED AND SENTINEL LYMPH NODE BIOPSY Left 10/06/2019   Procedure: LEFT BREAST LUMPECTOMY  X2 WITH RADIOACTIVE SEED X2  AND SENTINEL LYMPH NODE BIOPSY;  Surgeon: Griselda Miner, MD;  Location: [facility];  Service: General;  Laterality: Left;   BREAST LUMPECTOMY WITH RADIOACTIVE SEED LOCALIZATION Right 10/06/2019   Procedure: RIGHT BREAST LUMPECTOMY WITH RADIOACTIVE SEED  LOCALIZATION;  Surgeon: Griselda Miner, MD;  Location: [facility];  Service: General;  Laterality: Right;   BRONCHIAL BIOPSY  12/27/2020   Procedure: BRONCHIAL BIOPSIES;  Surgeon: Josephine Igo, DO;  Location: [facility];  Service: Pulmonary;;   BRONCHIAL BRUSHINGS  12/27/2020   Procedure: BRONCHIAL BRUSHINGS;  Surgeon: Josephine Igo, DO;  Location: [facility];  Service: Pulmonary;;   BRONCHIAL NEEDLE ASPIRATION BIOPSY  12/27/2020   Procedure: BRONCHIAL NEEDLE ASPIRATION BIOPSIES;  Surgeon: Josephine Igo, DO;  Location: [facility];  Service: Pulmonary;;   BRONCHIAL WASHINGS  12/27/2020   Procedure: BRONCHIAL WASHINGS;  Surgeon: Josephine Igo, DO;  Location: [facility];  Service: Pulmonary;;   COLONOSCOPY     DIAGNOSTIC LAPAROSCOPY     cysts   DILATATION & CURETTAGE/HYSTEROSCOPY WITH TRUECLEAR N/A 10/31/2013   Procedure: DILATATION & CURETTAGE/HYSTEROSCOPY WITH TRUCLEAR;  Surgeon: Zelphia Cairo, MD;  Location: [facility];  Service: Gynecology;  Laterality: N/A;   DILATION AND CURETTAGE OF UTERUS     MASS EXCISION Left 11/06/2019   Procedure: EXCISION NIPPLE AND AREOLA  MARGIN LEFT BREAST;  Surgeon: Griselda Miner, MD;  Location: [facility];  Service: General;  Laterality: Left;   TONSILLECTOMY  1952   VIDEO BRONCHOSCOPY WITH ENDOBRONCHIAL NAVIGATION Bilateral 12/27/2020   Procedure: VIDEO BRONCHOSCOPY WITH ENDOBRONCHIAL NAVIGATION;  Surgeon: Josephine Igo, DO;  Location: [facility];  Service: Pulmonary;  Laterality: Bilateral;   VIDEO BRONCHOSCOPY WITH ENDOBRONCHIAL ULTRASOUND Bilateral 12/27/2020   Procedure: VIDEO BRONCHOSCOPY WITH ENDOBRONCHIAL ULTRASOUND;  Surgeon: Josephine Igo, DO;  Location: [facility];  Service: Pulmonary;  Laterality: Bilateral;   WISDOM TOOTH EXTRACTION       Outpatient Encounter Medications as of 03/23/2023  Medication Sig   acetaminophen (TYLENOL) 500 MG tablet Take 500 mg by mouth every 6 (six) hours as needed  for headache or mild pain.   calcium-vitamin D (OSCAL WITH D) 500-5 MG-MCG tablet Take 1 tablet by mouth daily.   clidinium-chlordiazePOXIDE (LIBRAX) 5-2.5 MG capsule TAKE 1 CAPSULE EVERY DAY AS NEEDED   denosumab (PROLIA) 60 MG/ML SOSY injection Inject 60 mg into the skin every 6 (six) months. Last inj was in October   loratadine (CLARITIN) 10 MG tablet Take 10 mg by mouth daily.   OVER THE COUNTER MEDICATION Tums-as needed   [DISCONTINUED] omeprazole (PRILOSEC) 40 MG capsule Take 40 mg by mouth daily.   [DISCONTINUED] pravastatin (PRAVACHOL) 40 MG tablet Take 1 tablet (40 mg total) by mouth every evening.   [DISCONTINUED] prochlorperazine (COMPAZINE) 10 MG tablet Take 1 tablet (10 mg total) by mouth every 6 (six) hours as needed (Nausea or vomiting).   No facility-administered encounter medications on file as of 03/23/2023.     There were no vitals filed for this visit. There  is no height or weight on file to calculate BMI.   PHYSICAL EXAM GENERAL:alert, no distress and comfortable SKIN: no rash  EYES: sclera clear NECK: without mass LYMPH:  no palpable cervical or supraclavicular lymphadenopathy  LUNGS: clear with normal breathing effort HEART: regular rate & rhythm, no lower extremity edema ABDOMEN: abdomen soft, non-tender and normal bowel sounds NEURO: alert & oriented x 3 with fluent speech, no focal motor/sensory deficits Breast exam:  PAC without erythema    CBC    Component Value Date/Time   WBC 6.9 09/21/2022 0959   WBC 5.0 02/12/2020 1230   RBC 4.46 09/21/2022 0959   HGB 13.1 09/21/2022 0959   HCT 39.4 09/21/2022 0959   HCT 36.3 01/13/2021 1355   PLT 262 09/21/2022 0959   MCV 88.3 09/21/2022 0959   MCH 29.4 09/21/2022 0959   MCHC 33.2 09/21/2022 0959   RDW 13.8 09/21/2022 0959   LYMPHSABS 1.6 09/21/2022 0959   MONOABS 0.5 09/21/2022 0959   EOSABS 0.1 09/21/2022 0959   BASOSABS 0.1 09/21/2022 0959     CMP     Component Value Date/Time   NA 141 09/21/2022  0959   K 5.1 09/21/2022 0959   CL 107 09/21/2022 0959   CO2 30 09/21/2022 0959   GLUCOSE 105 (H) 09/21/2022 0959   BUN 16 09/21/2022 0959   CREATININE 0.94 09/21/2022 0959   CREATININE 0.92 08/14/2020 1025   CALCIUM 9.2 09/21/2022 0959   PROT 6.6 09/21/2022 0959   ALBUMIN 3.9 09/21/2022 0959   AST 12 (L) 09/21/2022 0959   ALT 12 09/21/2022 0959   ALKPHOS 63 09/21/2022 0959   BILITOT 0.4 09/21/2022 0959   GFRNONAA >60 09/21/2022 0959   GFRAA >60 02/12/2020 1230   GFRAA >60 01/03/2020 1241     ASSESSMENT & PLAN:Shelly [name] is a 77 y.o. post-menopausal female with    1. Cancer of central portion of left female breast, Stage IA, c(T1bN0M0), ER/PR+, HER2-, Grade II, pT1cN1aM0, stage IA  -Diagnosed in 08/2019, s/p B/l breast lumpectomy and SLNB with Dr. Carolynne Edouard on 10/06/19. Path showed: 1.8 cm IDC and DCIS, anterior margin focally positive; one positive lymph node with extranodal extension (1/3). Re-excision on 11/06/19 showed DCIS only, margins negative.  -Mammaprint showed high risk. She did not tolerate TC (allergic reaction to taxol) or Abraxane (symptomatic tachycardia, GI symptoms). -She completed adjuvant RT with Dr Basilio Cairo 01/25/20 - 03/06/20.  -She declined genetic testing -She took antiestrogen therapy over 2 months 05/2020-07/2020 and stopped due to poor toleration/side effects (exacerbated IBS from tamoxifen; bone concerns and anxiety from exemestane, declined to restart with SSRI).   3. Osteoporosis, Vitamin D deficiency -Diagnosed prior to 2007 in [place]. Last DEXA in 06/2015 (-3.2 at [facility]).  -she completed 5 years of Prolia in 01/2021. -repeat DEXA 08/22/21 showed improvement to osteopenia, T-score -1.5 -Patient resumed Prolia injections with her PCP in October 2022.  -Continue OTC Vit D and Tums (for calcium)   4. IBS, Diarrhea and Acid Reflux -history of IBS, noticed intermittent episodes of diarrhea after taking calcium supplements.  -Her BM are controlled on  Librax and Nexium and imodium. -Most recent colonoscopy on 05/20/21 showed multiple colonic polyps that were removed, non-bleeding internal hemorrhoids and diverticulosis. Pathology reveals no evidence of high grade dysphagia or carcinoma.  3-year recall -Continue to monitor.     PLAN:  No orders of the defined types were placed in this encounter.     All questions were answered. The patient knows to  call the clinic with any problems, questions or concerns. No barriers to learning were detected. I spent *** counseling the patient face to face. The total time spent in the appointment was *** and more than 50% was on counseling, review of test results, and coordination of care.   Santiago Glad, NP-C @DATE @

## 2023-03-23 ENCOUNTER — Encounter: Payer: Self-pay | Admitting: Nurse Practitioner

## 2023-03-23 ENCOUNTER — Inpatient Hospital Stay: Payer: Medicare Other

## 2023-03-23 ENCOUNTER — Other Ambulatory Visit: Payer: Self-pay

## 2023-03-23 ENCOUNTER — Inpatient Hospital Stay: Payer: Medicare Other | Attending: Nurse Practitioner | Admitting: Nurse Practitioner

## 2023-03-23 VITALS — BP 155/72 | HR 68 | Temp 98.6°F | Resp 16 | Wt 184.4 lb

## 2023-03-23 DIAGNOSIS — C50112 Malignant neoplasm of central portion of left female breast: Secondary | ICD-10-CM | POA: Diagnosis not present

## 2023-03-23 DIAGNOSIS — Z08 Encounter for follow-up examination after completed treatment for malignant neoplasm: Secondary | ICD-10-CM | POA: Diagnosis not present

## 2023-03-23 DIAGNOSIS — Z170 Estrogen receptor positive status [ER+]: Secondary | ICD-10-CM

## 2023-03-23 DIAGNOSIS — E559 Vitamin D deficiency, unspecified: Secondary | ICD-10-CM | POA: Insufficient documentation

## 2023-03-23 DIAGNOSIS — M810 Age-related osteoporosis without current pathological fracture: Secondary | ICD-10-CM | POA: Diagnosis not present

## 2023-03-23 DIAGNOSIS — Z853 Personal history of malignant neoplasm of breast: Secondary | ICD-10-CM | POA: Diagnosis not present

## 2023-03-23 LAB — CMP (CANCER CENTER ONLY)
ALT: 17 U/L (ref 0–44)
AST: 15 U/L (ref 15–41)
Albumin: 3.7 g/dL (ref 3.5–5.0)
Alkaline Phosphatase: 66 U/L (ref 38–126)
Anion gap: 6 (ref 5–15)
BUN: 19 mg/dL (ref 8–23)
CO2: 28 mmol/L (ref 22–32)
Calcium: 9.1 mg/dL (ref 8.9–10.3)
Chloride: 102 mmol/L (ref 98–111)
Creatinine: 0.85 mg/dL (ref 0.44–1.00)
GFR, Estimated: >60 mL/min (ref >60)
Glucose, Bld: 108 mg/dL — ABNORMAL HIGH (ref 70–99)
Potassium: 4 mmol/L (ref 3.5–5.1)
Sodium: 136 mmol/L (ref 135–145)
Total Bilirubin: 0.6 mg/dL (ref 0.3–1.2)
Total Protein: 6.9 g/dL (ref 6.5–8.1)

## 2023-03-23 LAB — CBC WITH DIFFERENTIAL (CANCER CENTER ONLY)
Abs Immature Granulocytes: 0.02 10*3/uL (ref 0.00–0.07)
Basophils Absolute: 0 10*3/uL (ref 0.0–0.1)
Basophils Relative: 1 %
Eosinophils Absolute: 0.1 10*3/uL (ref 0.0–0.5)
Eosinophils Relative: 1 %
HCT: 39.7 % (ref 36.0–46.0)
Hemoglobin: 13.3 g/dL (ref 12.0–15.0)
Immature Granulocytes: 0 %
Lymphocytes Relative: 26 %
Lymphs Abs: 1.5 10*3/uL (ref 0.7–4.0)
MCH: 29.7 pg (ref 26.0–34.0)
MCHC: 33.5 g/dL (ref 30.0–36.0)
MCV: 88.6 fL (ref 80.0–100.0)
Monocytes Absolute: 0.5 10*3/uL (ref 0.1–1.0)
Monocytes Relative: 9 %
Neutro Abs: 3.6 10*3/uL (ref 1.7–7.7)
Neutrophils Relative %: 63 %
Platelet Count: 276 10*3/uL (ref 150–400)
RBC: 4.48 MIL/uL (ref 3.87–5.11)
RDW: 13.6 % (ref 11.5–15.5)
WBC Count: 5.7 10*3/uL (ref 4.0–10.5)
nRBC: 0 % (ref 0.0–0.2)

## 2023-03-23 NOTE — Therapy (Unsigned)
OUTPATIENT PHYSICAL THERAPY TREATMENT NOTE   Patient Name: Shelly [name] MRN: [id number] DOB:[birth year], 77 y.o., female Today's Date: 03/25/2023  PCP: Kristian Covey, MD REFERRING PROVIDER: Kristian Covey, MD  END OF SESSION:   PT End of Session - 03/25/23 1439     Visit Number 10    Date for PT Re-Evaluation 06/10/23    Authorization Type MCR    Progress Note Due on Visit 10    PT Start Time 1400    PT Stop Time 0240    PT Time Calculation (min) 760 min    Activity Tolerance Patient tolerated treatment well    Behavior During Therapy [facility] for tasks assessed/performed                    Past Medical History:  Diagnosis Date   Allergy    Arthritis    wrist, shoulder    Cancer (HCC) 08/2019   left breast IDC   Diverticulitis    Endometrial polyp    Family history of breast cancer    GERD (gastroesophageal reflux disease)    occasional - diet controlled   Hx of adenomatous colonic polyps    Hyperlipidemia    IBS (irritable bowel syndrome)    Missed abortion    x 1 - resolved - no surgery   Osteoporosis    SVD (spontaneous vaginal delivery)    x 1   Transient vision disturbance 2019   was evaluated by ED, no findings   Vitamin D deficiency    Past Surgical History:  Procedure Laterality Date   BREAST EXCISIONAL BIOPSY Right 08/2019   Benign, lesion    BREAST LUMPECTOMY Left 09/2019   BREAST LUMPECTOMY WITH RADIOACTIVE SEED AND SENTINEL LYMPH NODE BIOPSY Left 10/06/2019   Procedure: LEFT BREAST LUMPECTOMY  X2 WITH RADIOACTIVE SEED X2  AND SENTINEL LYMPH NODE BIOPSY;  Surgeon: Griselda Miner, MD;  Location: [facility];  Service: General;  Laterality: Left;   BREAST LUMPECTOMY WITH RADIOACTIVE SEED LOCALIZATION Right 10/06/2019   Procedure: RIGHT BREAST LUMPECTOMY WITH RADIOACTIVE SEED LOCALIZATION;  Surgeon: Griselda Miner, MD;  Location: [facility];  Service: General;  Laterality: Right;   BRONCHIAL BIOPSY   12/27/2020   Procedure: BRONCHIAL BIOPSIES;  Surgeon: Josephine Igo, DO;  Location: [facility];  Service: Pulmonary;;   BRONCHIAL BRUSHINGS  12/27/2020   Procedure: BRONCHIAL BRUSHINGS;  Surgeon: Josephine Igo, DO;  Location: [facility];  Service: Pulmonary;;   BRONCHIAL NEEDLE ASPIRATION BIOPSY  12/27/2020   Procedure: BRONCHIAL NEEDLE ASPIRATION BIOPSIES;  Surgeon: Josephine Igo, DO;  Location: [facility];  Service: Pulmonary;;   BRONCHIAL WASHINGS  12/27/2020   Procedure: BRONCHIAL WASHINGS;  Surgeon: Josephine Igo, DO;  Location: [facility];  Service: Pulmonary;;   COLONOSCOPY     DIAGNOSTIC LAPAROSCOPY     cysts   DILATATION & CURETTAGE/HYSTEROSCOPY WITH TRUECLEAR N/A 10/31/2013   Procedure: DILATATION & CURETTAGE/HYSTEROSCOPY WITH TRUCLEAR;  Surgeon: Zelphia Cairo, MD;  Location: [facility];  Service: Gynecology;  Laterality: N/A;   DILATION AND CURETTAGE OF UTERUS     MASS EXCISION Left 11/06/2019   Procedure: EXCISION NIPPLE AND AREOLA  MARGIN LEFT BREAST;  Surgeon: Griselda Miner, MD;  Location: [facility];  Service: General;  Laterality: Left;   TONSILLECTOMY  1952   VIDEO BRONCHOSCOPY WITH ENDOBRONCHIAL NAVIGATION Bilateral 12/27/2020   Procedure: VIDEO BRONCHOSCOPY WITH ENDOBRONCHIAL NAVIGATION;  Surgeon: Josephine Igo, DO;  Location:  [facility];  Service: Pulmonary;  Laterality: Bilateral;   VIDEO BRONCHOSCOPY WITH ENDOBRONCHIAL ULTRASOUND Bilateral 12/27/2020   Procedure: VIDEO BRONCHOSCOPY WITH ENDOBRONCHIAL ULTRASOUND;  Surgeon: Josephine Igo, DO;  Location: [facility];  Service: Pulmonary;  Laterality: Bilateral;   WISDOM TOOTH EXTRACTION     Patient Active Problem List   Diagnosis Date Noted   Urinary frequency 04/12/2022   Acute bilateral low back pain without sciatica 04/12/2022   Essential hypertension 01/14/2022   Lung mass    Lesion of lung 12/04/2020   History of adenomatous polyp of colon 08/14/2020   Family history of  breast cancer    Cancer of central portion of left female breast (HCC) 09/18/2019   Prediabetes 04/06/2017   Osteoporosis 06/12/2015   IBS (irritable bowel syndrome) 08/21/2014   Hyperlipidemia 08/05/2011   ABDOMINAL PAIN RIGHT UPPER QUADRANT 10/30/2008   ABDOMINAL BLOATING 10/25/2008   GERD 06/04/2008   DIVERTICULOSIS OF COLON 06/04/2008    REFERRING DIAG: M54.9 (ICD-10-CM) - Upper back pain on left side  THERAPY DIAG:  Cervicalgia  Acute pain of left shoulder  Cramp and spasm  Rationale for Evaluation and Treatment Rehabilitation  PERTINENT HISTORY: L breast CA lumpectomy 2020, OP  PRECAUTIONS: osteoporosis  SUBJECTIVE:                                                                                                                                                                   SUBJECTIVE STATEMENT:  I had vein surgery and have been wearing my stockings, but yesterday I took them off. Today I am having a lot of cramping and stiffness in my legs. I do have my stockings on again today.    PAIN:  Are you having pain? Yes: NPRS scale: 0-4/10 Pain location: left UT, neck and shoulder Pain description: strain, pulling Aggravating factors: turning R>L, sleeping on shoulder Relieving factors: tylenol   OBJECTIVE:  DIAGNOSTIC FINDINGS:  Arthritis in neck per pt (xray from [facility].   PATIENT SURVEYS:  FOTO 47 (predicted 60) 03/18/23: 61 (goal met)   COGNITION: Overall cognitive status: Within functional limits for tasks assessed   SENSATION: WFL   POSTURE: rounded shoulders, forward head, and increased thoracic kyphosis   PALPATION: Palpation: TTP at L UT, LS, lats, subscap, pects. Increased tissue tension in L UT, LS Spinal Mobility:WFL     CERVICAL ROM: *pain   Active ROM A/PROM (deg) eval A/ROM 02/15/23 A/ROM 03/18/23  Flexion 15* 50 55  Extension 10    Right lateral flexion 26* 32 30  Left lateral flexion 38 40 50  Right rotation 38 55 55  Left  rotation 27* 40 50   (Blank rows = not tested)   UPPER EXTREMITY ROM:   A/P ROM Right eval Left eval  Left  03/18/23  Shoulder flexion   143/150 145  Shoulder extension   FULL   Shoulder abduction   115/129   Shoulder adduction       Shoulder extension       Shoulder internal rotation   FULL   Shoulder external rotation   55/68    (Blank rows = not tested)   UPPER EXTREMITY MMT: L shoulder ABD 4/5 with pain, else 5/5; L mid trap 4-/5, low trap 4+/5; R mid trap 4+, low trap 4-/5   SHOULDER SPECIAL TESTS: Positive HK, neg Impingement   LUMBAR ROM; WFL - tight HS limiting flexion, tight in rotation bil 25%     TODAY'S TREATMENT:     03/25/23: Nustep: level 1 x 6 minutes PT present to discuss progress and assess symptoms.  Addaday to L UT. Seated ball squeezes x10, 5 sec holds.  Bilat wall slide shoulder flexion x 10 with 5" hold  3 way raises: 1# added-2x10 bil each Seated UT/ Levator  stretch 2x30 sec 03/18/23: Arm bike: level 1.5 x 6 minutes (3/3)-PT present to discuss progress Bilat wall slide shoulder flexion x 10 with 5" hold  3 way raises: 1# added-2x10 bil each Seated UT/ Levator  stretch 2x30 sec Manual: elongation and release to Lt upper trap and neck  02/17/23: NuStep L5 x 8' PT present to discuss plan for session Bilat wall slide shoulder flexion x 10 Lt pec stretch in doorway 3x10 seconds  Standing bil UE row and ext red tband 2x10         Ball roll outs: forward and lateral x5 each with 10" hold  Seated UT/ Levator  stretch 2x30 sec Manual: elongation and release to Lt upper trap and neck            02/15/23: Cervical A/ROM 3 ways: 3x20 seconds  NuStep: Level 3x 7 minutes- PT present to discuss Ball roll outs 3 ways Trigger Point Dry-Needling  Treatment instructions: Expect mild to moderate muscle soreness. S/S of pneumothorax if dry needled over a lung field, and to seek immediate medical attention should they occur. Patient verbalized understanding of these  instructions and education.  Patient Consent Given: Yes Education handout provided: Previously provided Muscles treated: Lt upper trap, bil cervical multifidi Treatment response/outcome: Utilized skilled palpation to identify trigger points.  During dry needling able to palpate muscle twitch and muscle elongation  Elongation and release to neck after DN Skilled palpation and monitoring by PT during dry needling                                                                                                                     PATIENT EDUCATION:  Education details: PT eval findings, anticipated POC, initial HEP, and postural awareness   Person educated: Patient Education method: Explanation, Demonstration, and Handouts Education comprehension: verbalized understanding and returned demonstration   HOME EXERCISE PROGRAM: Access Code: [email or web address] URL: [email or web address] Date: 01/28/2023 Prepared by: Loistine Simas Beuhring  Exercises - Supine Chin Tuck  -  1 x daily - 7 x weekly - 3 sets - 10 reps - Single Arm Doorway Pec Stretch at 90 Degrees Abduction  - 2 x daily - 7 x weekly - 1 sets - 3 reps - 30 sec hold - Standing Single Shoulder Flexion Wall Slide with Palm Up  - 2 x daily - 7 x weekly - 1 sets - 10 reps - Seated Gentle Upper Trapezius Stretch (Mirrored)  - 2 x daily - 7 x weekly - 1 sets - 3 reps - 30 sec hold - Gentle Levator Scapulae Stretch  - 2 x daily - 7 x weekly - 1 sets - 3 reps - 30 sec hold - Standing Row with Anchored Resistance  - 1 x daily - 7 x weekly - 2 sets - 10 reps - Shoulder extension with resistance - Neutral  - 1 x daily - 7 x weekly - 2 sets - 10 reps - Supine Shoulder Horizontal Abduction with Resistance  - 1 x daily - 7 x weekly - 2 sets - 10 reps - Standing Shoulder Horizontal Abduction with Resistance  - 1 x daily - 7 x weekly - 2 sets - 10 reps - Shoulder External Rotation and Scapular Retraction with Resistance  - 1 x daily - 7 x weekly - 2 sets  - 10 reps - Sidelying Thoracic Rotation with Open Book  - 1 x daily - 7 x weekly - 2 sets - 10 reps - Seated Shoulder External Rotation AAROM with Cane and Hand in Neutral  - 1 x daily - 7 x weekly - 2 sets - 10 reps   ASSESSMENT:   CLINICAL IMPRESSION: Pt presents to PT with continued improvements in her shoulder, but noted cramping and stiffness in her bilat LE's. L>R. Pt with tension and trigger points in Lt upper trap and levator and repsponded well to Addaday and theracane today. She reports no residual tension present. Re-introduced pt to ball roll outs to help with shoulder flexion with reports of a pulling sensation in her lats. Monitored pt's symptoms in bilat LE's throughout session with reports of improved pain and stiffness throughout. Patient will benefit from skilled PT to address the below impairments and improve overall function.    OBJECTIVE IMPAIRMENTS: decreased ROM, decreased strength, increased muscle spasms, impaired flexibility, impaired UE functional use, postural dysfunction, and pain.    ACTIVITY LIMITATIONS: sleeping and reach over head   PARTICIPATION LIMITATIONS: driving   PERSONAL FACTORS: Age, Fitness, Time since onset of injury/illness/exacerbation, and 1-2 comorbidities: OP and previous lumpectomy surgery  are also affecting patient's functional outcome.    REHAB POTENTIAL: Good   CLINICAL DECISION MAKING: Stable/uncomplicated   EVALUATION COMPLEXITY: Low     GOALS: Goals reviewed with patient? Yes   SHORT TERM GOALS: Target date: 02/23/2023    Patient will be independent with initial HEP.  Baseline:  Goal status: MET (02/01/23)       LONG TERM GOALS: Target date: 04/30/23 Patient will be independent with advanced/ongoing HEP to improve outcomes and carryover.  Baseline:  further advancement is needed (03/18/23) Goal status: In progress    2.  Patient will report 90% improvement in left neck and shoulder pain to improve QOL.  Baseline: 75%  (03/18/23) Goal status: revised    3.  Patient will demonstrate functional pain free cervical ROM for safety with driving.  Baseline: see above  Goal status: In progress    4.  Patient will demonstrate improved posture to decrease muscle imbalance.  Baseline: difficulty maintaining posture due to functional weakness (03/18/23) Goal status: INITIAL   5.  Patient will report 77 on FOTO to demonstrate improved functional ability.  Baseline: 61 (03/18/23) Goal status: MET   6.  Patient will demonstrate improved active shoulder flexion to 150 deg or better without pain to ease OH ADLS.   Baseline: 145 (03/18/23) Goal status: In progress    7. Patient will be able to sleep on Lt shoulder without waking from pain. Baseline: able to sleep on Lt side without pain (02/17/23) Goal status: MET     PLAN:   PT FREQUENCY: 2x/week   PT DURATION: 6 weeks   PLANNED INTERVENTIONS: Therapeutic exercises, Therapeutic activity, Neuromuscular re-education, Patient/Family education, Self Care, Joint mobilization, Dry Needling, Electrical stimulation, Cryotherapy, Moist heat, Taping, Ionotophoresis 4mg /ml Dexamethasone, and Manual therapy   PLAN FOR NEXT SESSION: manual therapy PRN,  continue postural strength and cervical/thoracic mobility   Royal Hawthorn PT, DPT 03/25/23  2:41 PM

## 2023-03-24 ENCOUNTER — Telehealth: Payer: Self-pay

## 2023-03-24 NOTE — Telephone Encounter (Signed)
Faxed signed orders to [facility] for mastectomy products per Dr. Mosetta Putt, received fax confirmation of receipt. Placed original order in to be scanned.

## 2023-03-25 ENCOUNTER — Encounter: Payer: Self-pay | Admitting: Physical Therapy

## 2023-03-25 ENCOUNTER — Ambulatory Visit: Payer: Medicare Other | Admitting: Physical Therapy

## 2023-03-25 DIAGNOSIS — M25512 Pain in left shoulder: Secondary | ICD-10-CM | POA: Diagnosis not present

## 2023-03-25 DIAGNOSIS — R293 Abnormal posture: Secondary | ICD-10-CM | POA: Diagnosis not present

## 2023-03-25 DIAGNOSIS — M6281 Muscle weakness (generalized): Secondary | ICD-10-CM | POA: Diagnosis not present

## 2023-03-25 DIAGNOSIS — M542 Cervicalgia: Secondary | ICD-10-CM | POA: Diagnosis not present

## 2023-03-25 DIAGNOSIS — R252 Cramp and spasm: Secondary | ICD-10-CM | POA: Diagnosis not present

## 2023-03-30 DIAGNOSIS — I83812 Varicose veins of left lower extremities with pain: Secondary | ICD-10-CM | POA: Diagnosis not present

## 2023-03-30 DIAGNOSIS — I83892 Varicose veins of left lower extremities with other complications: Secondary | ICD-10-CM | POA: Diagnosis not present

## 2023-03-31 ENCOUNTER — Ambulatory Visit: Payer: Medicare Other

## 2023-03-31 DIAGNOSIS — M6281 Muscle weakness (generalized): Secondary | ICD-10-CM | POA: Diagnosis not present

## 2023-03-31 DIAGNOSIS — M25512 Pain in left shoulder: Secondary | ICD-10-CM

## 2023-03-31 DIAGNOSIS — M542 Cervicalgia: Secondary | ICD-10-CM

## 2023-03-31 DIAGNOSIS — R252 Cramp and spasm: Secondary | ICD-10-CM

## 2023-03-31 DIAGNOSIS — R293 Abnormal posture: Secondary | ICD-10-CM | POA: Diagnosis not present

## 2023-03-31 NOTE — Therapy (Signed)
OUTPATIENT PHYSICAL THERAPY TREATMENT NOTE   Patient Name: Shelly [name] MRN: [id number] DOB:February 02, 1946, 77 y.o., female Today's Date: 03/31/2023  PCP: Kristian Covey, MD REFERRING PROVIDER: Kristian Covey, MD  END OF SESSION:   PT End of Session - 03/31/23 1452     Visit Number 11    Date for PT Re-Evaluation 06/10/23    Authorization Type MCR    Progress Note Due on Visit 20    PT Start Time 1447    PT Stop Time 1520    PT Time Calculation (min) 33 min    Activity Tolerance Patient tolerated treatment well    Behavior During Therapy WFL for tasks assessed/performed                     Past Medical History:  Diagnosis Date   Allergy    Arthritis    wrist, shoulder    Cancer (HCC) 08/2019   left breast IDC   Diverticulitis    Endometrial polyp    Family history of breast cancer    GERD (gastroesophageal reflux disease)    occasional - diet controlled   Hx of adenomatous colonic polyps    Hyperlipidemia    IBS (irritable bowel syndrome)    Missed abortion    x 1 - resolved - no surgery   Osteoporosis    SVD (spontaneous vaginal delivery)    x 1   Transient vision disturbance 2019   was evaluated by ED, no findings   Vitamin D deficiency    Past Surgical History:  Procedure Laterality Date   BREAST EXCISIONAL BIOPSY Right 08/2019   Benign, lesion    BREAST LUMPECTOMY Left 09/2019   BREAST LUMPECTOMY WITH RADIOACTIVE SEED AND SENTINEL LYMPH NODE BIOPSY Left 10/06/2019   Procedure: LEFT BREAST LUMPECTOMY  X2 WITH RADIOACTIVE SEED X2  AND SENTINEL LYMPH NODE BIOPSY;  Surgeon: Griselda Miner, MD;  Location: [facility];  Service: General;  Laterality: Left;   BREAST LUMPECTOMY WITH RADIOACTIVE SEED LOCALIZATION Right 10/06/2019   Procedure: RIGHT BREAST LUMPECTOMY WITH RADIOACTIVE SEED LOCALIZATION;  Surgeon: Griselda Miner, MD;  Location: [facility];  Service: General;  Laterality: Right;   BRONCHIAL BIOPSY   12/27/2020   Procedure: BRONCHIAL BIOPSIES;  Surgeon: Josephine Igo, DO;  Location: [facility];  Service: Pulmonary;;   BRONCHIAL BRUSHINGS  12/27/2020   Procedure: BRONCHIAL BRUSHINGS;  Surgeon: Josephine Igo, DO;  Location: [facility];  Service: Pulmonary;;   BRONCHIAL NEEDLE ASPIRATION BIOPSY  12/27/2020   Procedure: BRONCHIAL NEEDLE ASPIRATION BIOPSIES;  Surgeon: Josephine Igo, DO;  Location: [facility];  Service: Pulmonary;;   BRONCHIAL WASHINGS  12/27/2020   Procedure: BRONCHIAL WASHINGS;  Surgeon: Josephine Igo, DO;  Location: [facility];  Service: Pulmonary;;   COLONOSCOPY     DIAGNOSTIC LAPAROSCOPY     cysts   DILATATION & CURETTAGE/HYSTEROSCOPY WITH TRUECLEAR N/A 10/31/2013   Procedure: DILATATION & CURETTAGE/HYSTEROSCOPY WITH TRUCLEAR;  Surgeon: Zelphia Cairo, MD;  Location: [facility];  Service: Gynecology;  Laterality: N/A;   DILATION AND CURETTAGE OF UTERUS     MASS EXCISION Left 11/06/2019   Procedure: EXCISION NIPPLE AND AREOLA  MARGIN LEFT BREAST;  Surgeon: Griselda Miner, MD;  Location: [facility];  Service: General;  Laterality: Left;   TONSILLECTOMY  1952   VIDEO BRONCHOSCOPY WITH ENDOBRONCHIAL NAVIGATION Bilateral 12/27/2020   Procedure: VIDEO BRONCHOSCOPY WITH ENDOBRONCHIAL NAVIGATION;  Surgeon: Josephine Igo, DO;  Location: [facility];  Service: Pulmonary;  Laterality: Bilateral;   VIDEO BRONCHOSCOPY WITH ENDOBRONCHIAL ULTRASOUND Bilateral 12/27/2020   Procedure: VIDEO BRONCHOSCOPY WITH ENDOBRONCHIAL ULTRASOUND;  Surgeon: Josephine Igo, DO;  Location: [facility];  Service: Pulmonary;  Laterality: Bilateral;   WISDOM TOOTH EXTRACTION     Patient Active Problem List   Diagnosis Date Noted   Urinary frequency 04/12/2022   Acute bilateral low back pain without sciatica 04/12/2022   Essential hypertension 01/14/2022   Lung mass    Lesion of lung 12/04/2020   History of adenomatous polyp of colon 08/14/2020   Family history of  breast cancer    Cancer of central portion of left female breast (HCC) 09/18/2019   Prediabetes 04/06/2017   Osteoporosis 06/12/2015   IBS (irritable bowel syndrome) 08/21/2014   Hyperlipidemia 08/05/2011   ABDOMINAL PAIN RIGHT UPPER QUADRANT 10/30/2008   ABDOMINAL BLOATING 10/25/2008   GERD 06/04/2008   DIVERTICULOSIS OF COLON 06/04/2008    REFERRING DIAG: M54.9 (ICD-10-CM) - Upper back pain on left side  THERAPY DIAG:  Cervicalgia  Cramp and spasm  Acute pain of left shoulder  Muscle weakness (generalized)  Abnormal posture  Rationale for Evaluation and Treatment Rehabilitation  PERTINENT HISTORY: L breast CA lumpectomy 2020, OP  PRECAUTIONS: osteoporosis  SUBJECTIVE:                                                                                                                                                                   SUBJECTIVE STATEMENT:  I have been having work done on my veins.  I didn't sleep well last night.  I just want to do manual on my neck today.     PAIN:  Are you having pain? Yes: NPRS scale: 3/10 Pain location: left UT, neck and shoulder Pain description: strain, pulling Aggravating factors: turning R>L, sleeping on shoulder Relieving factors: tylenol   OBJECTIVE:  DIAGNOSTIC FINDINGS:  Arthritis in neck per pt (xray from [facility].   PATIENT SURVEYS:  FOTO 47 (predicted 60) 03/18/23: 61 (goal met)   COGNITION: Overall cognitive status: Within functional limits for tasks assessed   SENSATION: WFL   POSTURE: rounded shoulders, forward head, and increased thoracic kyphosis   PALPATION: Palpation: TTP at L UT, LS, lats, subscap, pects. Increased tissue tension in L UT, LS Spinal Mobility:WFL     CERVICAL ROM: *pain   Active ROM A/PROM (deg) eval A/ROM 02/15/23 A/ROM 03/18/23  Flexion 15* 50 55  Extension 10    Right lateral flexion 26* 32 30  Left lateral flexion 38 40 50  Right rotation 38 55 55  Left rotation 27* 40 50    (Blank rows = not tested)   UPPER EXTREMITY ROM:   A/P ROM Right eval Left eval Left  03/18/23  Shoulder flexion   143/150 145  Shoulder extension   FULL   Shoulder abduction   115/129   Shoulder adduction       Shoulder extension       Shoulder internal rotation   FULL   Shoulder external rotation   55/68    (Blank rows = not tested)   UPPER EXTREMITY MMT: L shoulder ABD 4/5 with pain, else 5/5; L mid trap 4-/5, low trap 4+/5; R mid trap 4+, low trap 4-/5   SHOULDER SPECIAL TESTS: Positive HK, neg Impingement   LUMBAR ROM; WFL - tight HS limiting flexion, tight in rotation bil 25%     TODAY'S TREATMENT:    03/31/23:  Newman Pies rolls: 3 ways 5 second hold x5 each 3 way raises 1# 2x10 bil each Upper trap and levator stretch 3x20 seconds  Manual: elongation and release to Lt>Rt neck    03/25/23: Nustep: level 1 x 6 minutes PT present to discuss progress and assess symptoms.  Addaday to L UT. Seated ball squeezes x10, 5 sec holds.  Bilat wall slide shoulder flexion x 10 with 5" hold  3 way raises: 1# added-2x10 bil each Seated UT/ Levator  stretch 2x30 sec  03/18/23: Arm bike: level 1.5 x 6 minutes (3/3)-PT present to discuss progress Bilat wall slide shoulder flexion x 10 with 5" hold  3 way raises: 1# added-2x10 bil each Seated UT/ Levator  stretch 2x30 sec Manual: elongation and release to Lt upper trap and neck  02/17/23: NuStep L5 x 8' PT present to discuss plan for session Bilat wall slide shoulder flexion x 10 Lt pec stretch in doorway 3x10 seconds  Standing bil UE row and ext red tband 2x10         Ball roll outs: forward and lateral x5 each with 10" hold  Seated UT/ Levator  stretch 2x30 sec Manual: elongation and release to Lt upper trap and neck                                                                                                                     PATIENT EDUCATION:  Education details: PT eval findings, anticipated POC, initial HEP, and postural  awareness   Person educated: Patient Education method: Explanation, Demonstration, and Handouts Education comprehension: verbalized understanding and returned demonstration   HOME EXERCISE PROGRAM: Access Code: [email or web address] URL: [email or web address] Date: 01/28/2023 Prepared by: Loistine Simas Beuhring  Exercises - Supine Chin Tuck  - 1 x daily - 7 x weekly - 3 sets - 10 reps - Single Arm Doorway Pec Stretch at 90 Degrees Abduction  - 2 x daily - 7 x weekly - 1 sets - 3 reps - 30 sec hold - Standing Single Shoulder Flexion Wall Slide with Palm Up  - 2 x daily - 7 x weekly - 1 sets - 10 reps - Seated Gentle Upper Trapezius Stretch (Mirrored)  - 2 x daily - 7 x weekly - 1 sets - 3 reps - 30 sec hold -  Gentle Levator Scapulae Stretch  - 2 x daily - 7 x weekly - 1 sets - 3 reps - 30 sec hold - Standing Row with Anchored Resistance  - 1 x daily - 7 x weekly - 2 sets - 10 reps - Shoulder extension with resistance - Neutral  - 1 x daily - 7 x weekly - 2 sets - 10 reps - Supine Shoulder Horizontal Abduction with Resistance  - 1 x daily - 7 x weekly - 2 sets - 10 reps - Standing Shoulder Horizontal Abduction with Resistance  - 1 x daily - 7 x weekly - 2 sets - 10 reps - Shoulder External Rotation and Scapular Retraction with Resistance  - 1 x daily - 7 x weekly - 2 sets - 10 reps - Sidelying Thoracic Rotation with Open Book  - 1 x daily - 7 x weekly - 2 sets - 10 reps - Seated Shoulder External Rotation AAROM with Cane and Hand in Neutral  - 1 x daily - 7 x weekly - 2 sets - 10 reps   ASSESSMENT:   CLINICAL IMPRESSION: Pt arrived with bil leg pain due to vein procedure and requested massage only.  PT encouraged pt to do some gentle mobility.  She tolerated this well.  Monitored pt's symptoms in bilat LE's throughout session with reports of improved pain and stiffness throughout.  Pt with significant tension in bil neck and responded well to manual therapy.  Patient will benefit from skilled  PT to address the below impairments and improve overall function.    OBJECTIVE IMPAIRMENTS: decreased ROM, decreased strength, increased muscle spasms, impaired flexibility, impaired UE functional use, postural dysfunction, and pain.    ACTIVITY LIMITATIONS: sleeping and reach over head   PARTICIPATION LIMITATIONS: driving   PERSONAL FACTORS: Age, Fitness, Time since onset of injury/illness/exacerbation, and 1-2 comorbidities: OP and previous lumpectomy surgery  are also affecting patient's functional outcome.    REHAB POTENTIAL: Good   CLINICAL DECISION MAKING: Stable/uncomplicated   EVALUATION COMPLEXITY: Low     GOALS: Goals reviewed with patient? Yes   SHORT TERM GOALS: Target date: 02/23/2023    Patient will be independent with initial HEP.  Baseline:  Goal status: MET (02/01/23)       LONG TERM GOALS: Target date: 04/30/23 Patient will be independent with advanced/ongoing HEP to improve outcomes and carryover.  Baseline:  further advancement is needed (03/18/23) Goal status: In progress    2.  Patient will report 90% improvement in left neck and shoulder pain to improve QOL.  Baseline: 75% (03/18/23) Goal status: revised    3.  Patient will demonstrate functional pain free cervical ROM for safety with driving.  Baseline: see above  Goal status: In progress    4.  Patient will demonstrate improved posture to decrease muscle imbalance. Baseline: difficulty maintaining posture due to functional weakness (03/18/23) Goal status: INITIAL   5.  Patient will report 57 on FOTO to demonstrate improved functional ability.  Baseline: 61 (03/18/23) Goal status: MET   6.  Patient will demonstrate improved active shoulder flexion to 150 deg or better without pain to ease OH ADLS.   Baseline: 145 (03/18/23) Goal status: In progress    7. Patient will be able to sleep on Lt shoulder without waking from pain. Baseline: able to sleep on Lt side without pain (02/17/23) Goal status: MET      PLAN:   PT FREQUENCY: 2x/week   PT DURATION: 6 weeks   PLANNED INTERVENTIONS: Therapeutic  exercises, Therapeutic activity, Neuromuscular re-education, Patient/Family education, Self Care, Joint mobilization, Dry Needling, Electrical stimulation, Cryotherapy, Moist heat, Taping, Ionotophoresis 4mg /ml Dexamethasone, and Manual therapy   PLAN FOR NEXT SESSION: manual therapy PRN,  continue postural strength and cervical/thoracic mobility   Lorrene Reid, PT 03/31/23 3:26 PM

## 2023-04-14 ENCOUNTER — Ambulatory Visit: Payer: Medicare Other

## 2023-04-14 DIAGNOSIS — R252 Cramp and spasm: Secondary | ICD-10-CM | POA: Diagnosis not present

## 2023-04-14 DIAGNOSIS — M6281 Muscle weakness (generalized): Secondary | ICD-10-CM | POA: Diagnosis not present

## 2023-04-14 DIAGNOSIS — M25512 Pain in left shoulder: Secondary | ICD-10-CM

## 2023-04-14 DIAGNOSIS — R293 Abnormal posture: Secondary | ICD-10-CM

## 2023-04-14 DIAGNOSIS — M542 Cervicalgia: Secondary | ICD-10-CM | POA: Diagnosis not present

## 2023-04-14 NOTE — Therapy (Signed)
OUTPATIENT PHYSICAL THERAPY TREATMENT NOTE   Patient Name: Shelly [name] MRN: [id number] DOB:1945/12/09, 77 y.o., female Today's Date: 04/14/2023  PCP: Kristian Covey, MD REFERRING PROVIDER: Kristian Covey, MD  END OF SESSION:   PT End of Session - 04/14/23 1436     Visit Number 12    Date for PT Re-Evaluation 06/10/23    Authorization Type MCR    Progress Note Due on Visit 20    PT Start Time 1400    PT Stop Time 1432    PT Time Calculation (min) 32 min    Activity Tolerance Patient tolerated treatment well    Behavior During Therapy WFL for tasks assessed/performed                      Past Medical History:  Diagnosis Date   Allergy    Arthritis    wrist, shoulder    Cancer (HCC) 08/2019   left breast IDC   Diverticulitis    Endometrial polyp    Family history of breast cancer    GERD (gastroesophageal reflux disease)    occasional - diet controlled   Hx of adenomatous colonic polyps    Hyperlipidemia    IBS (irritable bowel syndrome)    Missed abortion    x 1 - resolved - no surgery   Osteoporosis    SVD (spontaneous vaginal delivery)    x 1   Transient vision disturbance 2019   was evaluated by ED, no findings   Vitamin D deficiency    Past Surgical History:  Procedure Laterality Date   BREAST EXCISIONAL BIOPSY Right 08/2019   Benign, lesion    BREAST LUMPECTOMY Left 09/2019   BREAST LUMPECTOMY WITH RADIOACTIVE SEED AND SENTINEL LYMPH NODE BIOPSY Left 10/06/2019   Procedure: LEFT BREAST LUMPECTOMY  X2 WITH RADIOACTIVE SEED X2  AND SENTINEL LYMPH NODE BIOPSY;  Surgeon: Griselda Miner, MD;  Location: [facility];  Service: General;  Laterality: Left;   BREAST LUMPECTOMY WITH RADIOACTIVE SEED LOCALIZATION Right 10/06/2019   Procedure: RIGHT BREAST LUMPECTOMY WITH RADIOACTIVE SEED LOCALIZATION;  Surgeon: Griselda Miner, MD;  Location: [facility];  Service: General;  Laterality: Right;   BRONCHIAL BIOPSY   12/27/2020   Procedure: BRONCHIAL BIOPSIES;  Surgeon: Josephine Igo, DO;  Location: [facility];  Service: Pulmonary;;   BRONCHIAL BRUSHINGS  12/27/2020   Procedure: BRONCHIAL BRUSHINGS;  Surgeon: Josephine Igo, DO;  Location: [facility];  Service: Pulmonary;;   BRONCHIAL NEEDLE ASPIRATION BIOPSY  12/27/2020   Procedure: BRONCHIAL NEEDLE ASPIRATION BIOPSIES;  Surgeon: Josephine Igo, DO;  Location: [facility];  Service: Pulmonary;;   BRONCHIAL WASHINGS  12/27/2020   Procedure: BRONCHIAL WASHINGS;  Surgeon: Josephine Igo, DO;  Location: [facility];  Service: Pulmonary;;   COLONOSCOPY     DIAGNOSTIC LAPAROSCOPY     cysts   DILATATION & CURETTAGE/HYSTEROSCOPY WITH TRUECLEAR N/A 10/31/2013   Procedure: DILATATION & CURETTAGE/HYSTEROSCOPY WITH TRUCLEAR;  Surgeon: Zelphia Cairo, MD;  Location: [facility];  Service: Gynecology;  Laterality: N/A;   DILATION AND CURETTAGE OF UTERUS     MASS EXCISION Left 11/06/2019   Procedure: EXCISION NIPPLE AND AREOLA  MARGIN LEFT BREAST;  Surgeon: Griselda Miner, MD;  Location: [facility];  Service: General;  Laterality: Left;   TONSILLECTOMY  1952   VIDEO BRONCHOSCOPY WITH ENDOBRONCHIAL NAVIGATION Bilateral 12/27/2020   Procedure: VIDEO BRONCHOSCOPY WITH ENDOBRONCHIAL NAVIGATION;  Surgeon: Josephine Igo, DO;  Location: [facility];  Service: Pulmonary;  Laterality: Bilateral;   VIDEO BRONCHOSCOPY WITH ENDOBRONCHIAL ULTRASOUND Bilateral 12/27/2020   Procedure: VIDEO BRONCHOSCOPY WITH ENDOBRONCHIAL ULTRASOUND;  Surgeon: Josephine Igo, DO;  Location: [facility];  Service: Pulmonary;  Laterality: Bilateral;   WISDOM TOOTH EXTRACTION     Patient Active Problem List   Diagnosis Date Noted   Urinary frequency 04/12/2022   Acute bilateral low back pain without sciatica 04/12/2022   Essential hypertension 01/14/2022   Lung mass    Lesion of lung 12/04/2020   History of adenomatous polyp of colon 08/14/2020   Family history of  breast cancer    Cancer of central portion of left female breast (HCC) 09/18/2019   Prediabetes 04/06/2017   Osteoporosis 06/12/2015   IBS (irritable bowel syndrome) 08/21/2014   Hyperlipidemia 08/05/2011   ABDOMINAL PAIN RIGHT UPPER QUADRANT 10/30/2008   ABDOMINAL BLOATING 10/25/2008   GERD 06/04/2008   DIVERTICULOSIS OF COLON 06/04/2008    REFERRING DIAG: M54.9 (ICD-10-CM) - Upper back pain on left side  THERAPY DIAG:  Cervicalgia  Cramp and spasm  Acute pain of left shoulder  Muscle weakness (generalized)  Abnormal posture  Rationale for Evaluation and Treatment Rehabilitation  PERTINENT HISTORY: L breast CA lumpectomy 2020, OP  PRECAUTIONS: osteoporosis  SUBJECTIVE:                                                                                                                                                                   SUBJECTIVE STATEMENT: I did some sweeping for too long and made my neck sore yesterday.  I stretched and took a hot shower this morning and now it is better.     PAIN:  Are you having pain? Yes: NPRS scale: 0/10 today.  Sore in Lt upper trap.   Pain location: left UT, neck and shoulder Pain description: strain, pulling Aggravating factors: turning R>L, sleeping on shoulder Relieving factors: tylenol   OBJECTIVE:  DIAGNOSTIC FINDINGS:  Arthritis in neck per pt (xray from [facility].   PATIENT SURVEYS:  FOTO 47 (predicted 60) 03/18/23: 61 (goal met)   COGNITION: Overall cognitive status: Within functional limits for tasks assessed   SENSATION: WFL   POSTURE: rounded shoulders, forward head, and increased thoracic kyphosis   PALPATION: Palpation: TTP at L UT, LS, lats, subscap, pects. Increased tissue tension in L UT, LS Spinal Mobility:WFL     CERVICAL ROM: *pain   Active ROM A/PROM (deg) eval A/ROM 02/15/23 A/ROM 03/18/23  Flexion 15* 50 55  Extension 10    Right lateral flexion 26* 32 30  Left lateral flexion 38 40 50   Right rotation 38 55 55  Left rotation 27* 40 50   (Blank rows = not tested)   UPPER EXTREMITY ROM:  A/P ROM Right eval Left eval Left  03/18/23  Shoulder flexion   143/150 145  Shoulder extension   FULL   Shoulder abduction   115/129   Shoulder adduction       Shoulder extension       Shoulder internal rotation   FULL   Shoulder external rotation   55/68    (Blank rows = not tested)   UPPER EXTREMITY MMT: L shoulder ABD 4/5 with pain, else 5/5; L mid trap 4-/5, low trap 4+/5; R mid trap 4+, low trap 4-/5   SHOULDER SPECIAL TESTS: Positive HK, neg Impingement   LUMBAR ROM; WFL - tight HS limiting flexion, tight in rotation bil 25%     TODAY'S TREATMENT:    04/14/23:  Newman Pies rolls: 3 ways 5 second hold x5 each Shoulder extension and rows: red theraband 2x10 3 way raises 1# 2x10 bil each Upper trap and levator stretch 3x20 seconds  ER with red band: 2x10  Manual: elongation and release to Lt upper trap and neck   03/31/23:  Ball rolls: 3 ways 5 second hold x5 each 3 way raises 1# 2x10 bil each Upper trap and levator stretch 3x20 seconds  Manual: elongation and release to Lt>Rt neck    03/25/23: Nustep: level 1 x 6 minutes PT present to discuss progress and assess symptoms.  Addaday to L UT. Seated ball squeezes x10, 5 sec holds.  Bilat wall slide shoulder flexion x 10 with 5" hold  3 way raises: 1# added-2x10 bil each Seated UT/ Levator  stretch 2x30 sec             PATIENT EDUCATION:  Education details: PT eval findings, anticipated POC, initial HEP, and postural awareness   Person educated: Patient Education method: Explanation, Demonstration, and Handouts Education comprehension: verbalized understanding and returned demonstration   HOME EXERCISE PROGRAM: Access Code: [email or web address] URL: [email or web address] Date: 01/28/2023 Prepared by: Loistine Simas Beuhring  Exercises - Supine Chin Tuck  - 1 x daily - 7 x weekly - 3 sets - 10 reps - Single Arm  Doorway Pec Stretch at 90 Degrees Abduction  - 2 x daily - 7 x weekly - 1 sets - 3 reps - 30 sec hold - Standing Single Shoulder Flexion Wall Slide with Palm Up  - 2 x daily - 7 x weekly - 1 sets - 10 reps - Seated Gentle Upper Trapezius Stretch (Mirrored)  - 2 x daily - 7 x weekly - 1 sets - 3 reps - 30 sec hold - Gentle Levator Scapulae Stretch  - 2 x daily - 7 x weekly - 1 sets - 3 reps - 30 sec hold - Standing Row with Anchored Resistance  - 1 x daily - 7 x weekly - 2 sets - 10 reps - Shoulder extension with resistance - Neutral  - 1 x daily - 7 x weekly - 2 sets - 10 reps - Supine Shoulder Horizontal Abduction with Resistance  - 1 x daily - 7 x weekly - 2 sets - 10 reps - Standing Shoulder Horizontal Abduction with Resistance  - 1 x daily - 7 x weekly - 2 sets - 10 reps - Shoulder External Rotation and Scapular Retraction with Resistance  - 1 x daily - 7 x weekly - 2 sets - 10 reps - Sidelying Thoracic Rotation with Open Book  - 1 x daily - 7 x weekly - 2 sets - 10 reps - Seated Shoulder External Rotation AAROM with Cane and Hand in  Neutral  - 1 x daily - 7 x weekly - 2 sets - 10 reps   ASSESSMENT:   CLINICAL IMPRESSION: Pt with lapse in treatment x 2 weeks.  Overall, she reports that she is doing well and has had minimal pain aside from muscle tension after sweeping.   Pt reports moderate compliance with HEP.   Pt required tactile and verbal cues for scapular depression with band exercises and weights. Pt with significant tension in Lt neck and responded well to manual therapy.  She reports that she has had minimal pain since last session.  Patient will benefit from skilled PT to address the below impairments and improve overall function.    OBJECTIVE IMPAIRMENTS: decreased ROM, decreased strength, increased muscle spasms, impaired flexibility, impaired UE functional use, postural dysfunction, and pain.    ACTIVITY LIMITATIONS: sleeping and reach over head   PARTICIPATION LIMITATIONS:  driving   PERSONAL FACTORS: Age, Fitness, Time since onset of injury/illness/exacerbation, and 1-2 comorbidities: OP and previous lumpectomy surgery  are also affecting patient's functional outcome.    REHAB POTENTIAL: Good   CLINICAL DECISION MAKING: Stable/uncomplicated   EVALUATION COMPLEXITY: Low     GOALS: Goals reviewed with patient? Yes   SHORT TERM GOALS: Target date: 02/23/2023    Patient will be independent with initial HEP.  Baseline:  Goal status: MET (02/01/23)       LONG TERM GOALS: Target date: 04/30/23 Patient will be independent with advanced/ongoing HEP to improve outcomes and carryover.  Baseline:  further advancement is needed (03/18/23) Goal status: In progress    2.  Patient will report 90% improvement in left neck and shoulder pain to improve QOL.  Baseline: 75% (03/18/23) Goal status: revised    3.  Patient will demonstrate functional pain free cervical ROM for safety with driving.  Baseline: see above  Goal status: In progress    4.  Patient will demonstrate improved posture to decrease muscle imbalance. Baseline: difficulty maintaining posture due to functional weakness (03/18/23) Goal status: INITIAL   5.  Patient will report 85 on FOTO to demonstrate improved functional ability.  Baseline: 61 (03/18/23) Goal status: MET   6.  Patient will demonstrate improved active shoulder flexion to 150 deg or better without pain to ease [place] ADLS.   Baseline: 145 (03/18/23) Goal status: In progress    7. Patient will be able to sleep on Lt shoulder without waking from pain. Baseline: able to sleep on Lt side without pain (02/17/23) Goal status: MET     PLAN:   PT FREQUENCY: 2x/week   PT DURATION: 6 weeks   PLANNED INTERVENTIONS: Therapeutic exercises, Therapeutic activity, Neuromuscular re-education, Patient/Family education, Self Care, Joint mobilization, Dry Needling, Electrical stimulation, Cryotherapy, Moist heat, Taping, Ionotophoresis 4mg /ml Dexamethasone,  and Manual therapy   PLAN FOR NEXT SESSION: manual therapy PRN,  continue postural strength and cervical/thoracic mobility,  consider D/C in 1-2 visits if pt still feeling good   Lorrene Reid, PT 04/14/23 2:37 PM

## 2023-04-15 DIAGNOSIS — Z170 Estrogen receptor positive status [ER+]: Secondary | ICD-10-CM | POA: Diagnosis not present

## 2023-04-15 DIAGNOSIS — C50112 Malignant neoplasm of central portion of left female breast: Secondary | ICD-10-CM | POA: Diagnosis not present

## 2023-04-20 ENCOUNTER — Ambulatory Visit: Payer: Medicare Other | Attending: Family Medicine

## 2023-04-20 DIAGNOSIS — M25512 Pain in left shoulder: Secondary | ICD-10-CM | POA: Insufficient documentation

## 2023-04-20 DIAGNOSIS — M6281 Muscle weakness (generalized): Secondary | ICD-10-CM | POA: Insufficient documentation

## 2023-04-20 DIAGNOSIS — M542 Cervicalgia: Secondary | ICD-10-CM | POA: Insufficient documentation

## 2023-04-20 DIAGNOSIS — R252 Cramp and spasm: Secondary | ICD-10-CM | POA: Diagnosis not present

## 2023-04-20 NOTE — Therapy (Signed)
OUTPATIENT PHYSICAL THERAPY TREATMENT NOTE   Patient Name: Shelly [name] MRN: [id number] DOB:December 10, 1945, 77 y.o., female Today's Date: 04/20/2023  PCP: Kristian Covey, MD REFERRING PROVIDER: Kristian Covey, MD  END OF SESSION:   PT End of Session - 04/20/23 1139     Visit Number 13    Date for PT Re-Evaluation 06/10/23    Authorization Type MCR    Progress Note Due on Visit 20    PT Start Time 1102    PT Stop Time 1137    PT Time Calculation (min) 35 min    Activity Tolerance Patient tolerated treatment well    Behavior During Therapy WFL for tasks assessed/performed                       Past Medical History:  Diagnosis Date   Allergy    Arthritis    wrist, shoulder    Cancer (HCC) 08/2019   left breast IDC   Diverticulitis    Endometrial polyp    Family history of breast cancer    GERD (gastroesophageal reflux disease)    occasional - diet controlled   Hx of adenomatous colonic polyps    Hyperlipidemia    IBS (irritable bowel syndrome)    Missed abortion    x 1 - resolved - no surgery   Osteoporosis    SVD (spontaneous vaginal delivery)    x 1   Transient vision disturbance 2019   was evaluated by ED, no findings   Vitamin D deficiency    Past Surgical History:  Procedure Laterality Date   BREAST EXCISIONAL BIOPSY Right 08/2019   Benign, lesion    BREAST LUMPECTOMY Left 09/2019   BREAST LUMPECTOMY WITH RADIOACTIVE SEED AND SENTINEL LYMPH NODE BIOPSY Left 10/06/2019   Procedure: LEFT BREAST LUMPECTOMY  X2 WITH RADIOACTIVE SEED X2  AND SENTINEL LYMPH NODE BIOPSY;  Surgeon: Griselda Miner, MD;  Location: [facility];  Service: General;  Laterality: Left;   BREAST LUMPECTOMY WITH RADIOACTIVE SEED LOCALIZATION Right 10/06/2019   Procedure: RIGHT BREAST LUMPECTOMY WITH RADIOACTIVE SEED LOCALIZATION;  Surgeon: Griselda Miner, MD;  Location: [facility];  Service: General;  Laterality: Right;   BRONCHIAL BIOPSY   12/27/2020   Procedure: BRONCHIAL BIOPSIES;  Surgeon: Josephine Igo, DO;  Location: [facility];  Service: Pulmonary;;   BRONCHIAL BRUSHINGS  12/27/2020   Procedure: BRONCHIAL BRUSHINGS;  Surgeon: Josephine Igo, DO;  Location: [facility];  Service: Pulmonary;;   BRONCHIAL NEEDLE ASPIRATION BIOPSY  12/27/2020   Procedure: BRONCHIAL NEEDLE ASPIRATION BIOPSIES;  Surgeon: Josephine Igo, DO;  Location: [facility];  Service: Pulmonary;;   BRONCHIAL WASHINGS  12/27/2020   Procedure: BRONCHIAL WASHINGS;  Surgeon: Josephine Igo, DO;  Location: [facility];  Service: Pulmonary;;   COLONOSCOPY     DIAGNOSTIC LAPAROSCOPY     cysts   DILATATION & CURETTAGE/HYSTEROSCOPY WITH TRUECLEAR N/A 10/31/2013   Procedure: DILATATION & CURETTAGE/HYSTEROSCOPY WITH TRUCLEAR;  Surgeon: Zelphia Cairo, MD;  Location: [facility];  Service: Gynecology;  Laterality: N/A;   DILATION AND CURETTAGE OF UTERUS     MASS EXCISION Left 11/06/2019   Procedure: EXCISION NIPPLE AND AREOLA  MARGIN LEFT BREAST;  Surgeon: Griselda Miner, MD;  Location: [facility];  Service: General;  Laterality: Left;   TONSILLECTOMY  1952   VIDEO BRONCHOSCOPY WITH ENDOBRONCHIAL NAVIGATION Bilateral 12/27/2020   Procedure: VIDEO BRONCHOSCOPY WITH ENDOBRONCHIAL NAVIGATION;  Surgeon: Josephine Igo,  DO;  Location: [facility];  Service: Pulmonary;  Laterality: Bilateral;   VIDEO BRONCHOSCOPY WITH ENDOBRONCHIAL ULTRASOUND Bilateral 12/27/2020   Procedure: VIDEO BRONCHOSCOPY WITH ENDOBRONCHIAL ULTRASOUND;  Surgeon: Josephine Igo, DO;  Location: [facility];  Service: Pulmonary;  Laterality: Bilateral;   WISDOM TOOTH EXTRACTION     Patient Active Problem List   Diagnosis Date Noted   Urinary frequency 04/12/2022   Acute bilateral low back pain without sciatica 04/12/2022   Essential hypertension 01/14/2022   Lung mass    Lesion of lung 12/04/2020   History of adenomatous polyp of colon 08/14/2020   Family history of  breast cancer    Cancer of central portion of left female breast (HCC) 09/18/2019   Prediabetes 04/06/2017   Osteoporosis 06/12/2015   IBS (irritable bowel syndrome) 08/21/2014   Hyperlipidemia 08/05/2011   ABDOMINAL PAIN RIGHT UPPER QUADRANT 10/30/2008   ABDOMINAL BLOATING 10/25/2008   GERD 06/04/2008   DIVERTICULOSIS OF COLON 06/04/2008    REFERRING DIAG: M54.9 (ICD-10-CM) - Upper back pain on left side  THERAPY DIAG:  Cervicalgia  Cramp and spasm  Acute pain of left shoulder  Muscle weakness (generalized)  Rationale for Evaluation and Treatment Rehabilitation  PERTINENT HISTORY: L breast CA lumpectomy 2020, OP  PRECAUTIONS: osteoporosis  SUBJECTIVE:                                                                                                                                                                   SUBJECTIVE STATEMENT: Yesterday, my neck felt totally better.  Today, I have some tension in my neck.  I think the couch makes it hurt.    PAIN:  Are you having pain? Yes: NPRS scale: 1/10 today.  Sore in Lt upper trap.   Pain location: left UT, neck and shoulder Pain description: strain, pulling Aggravating factors: turning R>L, sleeping on shoulder Relieving factors: tylenol   OBJECTIVE:  DIAGNOSTIC FINDINGS:  Arthritis in neck per pt (xray from [facility].   PATIENT SURVEYS:  FOTO 47 (predicted 60) 03/18/23: 61 (goal met)   COGNITION: Overall cognitive status: Within functional limits for tasks assessed   SENSATION: WFL   POSTURE: rounded shoulders, forward head, and increased thoracic kyphosis   PALPATION: Palpation: TTP at L UT, LS, lats, subscap, pects. Increased tissue tension in L UT, LS Spinal Mobility:WFL     CERVICAL ROM: *pain   Active ROM A/PROM (deg) eval A/ROM 02/15/23 A/ROM 03/18/23  Flexion 15* 50 55  Extension 10    Right lateral flexion 26* 32 30  Left lateral flexion 38 40 50  Right rotation 38 55 55  Left rotation 27* 40  50   (Blank rows = not tested)   UPPER EXTREMITY ROM:   A/P ROM Right eval Left  eval Left  03/18/23  Shoulder flexion   143/150 145  Shoulder extension   FULL   Shoulder abduction   115/129   Shoulder adduction       Shoulder extension       Shoulder internal rotation   FULL   Shoulder external rotation   55/68    (Blank rows = not tested)   UPPER EXTREMITY MMT: L shoulder ABD 4/5 with pain, else 5/5; L mid trap 4-/5, low trap 4+/5; R mid trap 4+, low trap 4-/5   SHOULDER SPECIAL TESTS: Positive HK, neg Impingement   LUMBAR ROM; WFL - tight HS limiting flexion, tight in rotation bil 25%     TODAY'S TREATMENT:    04/20/23:  Ball rolls: 3 ways 5 second hold x5 each Shoulder extension and rows: red theraband 2x10 3 way raises 1# 2x10 bil each Upper trap and levator stretch 3x20 seconds  ER with red band: 2x10  Manual: elongation and release to Lt upper trap and neck   04/14/23:  Ball rolls: 3 ways 5 second hold x5 each Shoulder extension and rows: red theraband 2x10 3 way raises 1# 2x10 bil each Upper trap and levator stretch 3x20 seconds  ER with red band: 2x10  Manual: elongation and release to Lt upper trap and neck   03/31/23:  Ball rolls: 3 ways 5 second hold x5 each 3 way raises 1# 2x10 bil each Upper trap and levator stretch 3x20 seconds  Manual: elongation and release to Lt>Rt neck     PATIENT EDUCATION:  Education details: PT eval findings, anticipated POC, initial HEP, and postural awareness   Person educated: Patient Education method: Explanation, Demonstration, and Handouts Education comprehension: verbalized understanding and returned demonstration   HOME EXERCISE PROGRAM: Access Code: [email or web address] URL: [email or web address] Date: 01/28/2023 Prepared by: Loistine Simas Beuhring  Exercises - Supine Chin Tuck  - 1 x daily - 7 x weekly - 3 sets - 10 reps - Single Arm Doorway Pec Stretch at 90 Degrees Abduction  - 2 x daily - 7 x weekly - 1 sets - 3  reps - 30 sec hold - Standing Single Shoulder Flexion Wall Slide with Palm Up  - 2 x daily - 7 x weekly - 1 sets - 10 reps - Seated Gentle Upper Trapezius Stretch (Mirrored)  - 2 x daily - 7 x weekly - 1 sets - 3 reps - 30 sec hold - Gentle Levator Scapulae Stretch  - 2 x daily - 7 x weekly - 1 sets - 3 reps - 30 sec hold - Standing Row with Anchored Resistance  - 1 x daily - 7 x weekly - 2 sets - 10 reps - Shoulder extension with resistance - Neutral  - 1 x daily - 7 x weekly - 2 sets - 10 reps - Supine Shoulder Horizontal Abduction with Resistance  - 1 x daily - 7 x weekly - 2 sets - 10 reps - Standing Shoulder Horizontal Abduction with Resistance  - 1 x daily - 7 x weekly - 2 sets - 10 reps - Shoulder External Rotation and Scapular Retraction with Resistance  - 1 x daily - 7 x weekly - 2 sets - 10 reps - Sidelying Thoracic Rotation with Open Book  - 1 x daily - 7 x weekly - 2 sets - 10 reps - Seated Shoulder External Rotation AAROM with Cane and Hand in Neutral  - 1 x daily - 7 x weekly - 2 sets - 10 reps  ASSESSMENT:   CLINICAL IMPRESSION:   Overall, she reports that she has fluctuating pain and has more good days than bad days.   Pt reports improved compliance with HEP.   Pt required tactile and verbal cues for scapular depression with band exercises and weights but cueing is less frequent.  Pt with significant tension in Lt neck and responded well to manual therapy.  She reports that she has had minimal pain since last session.  Patient will benefit from skilled PT to address the below impairments and improve overall function.    OBJECTIVE IMPAIRMENTS: decreased ROM, decreased strength, increased muscle spasms, impaired flexibility, impaired UE functional use, postural dysfunction, and pain.    ACTIVITY LIMITATIONS: sleeping and reach over head   PARTICIPATION LIMITATIONS: driving   PERSONAL FACTORS: Age, Fitness, Time since onset of injury/illness/exacerbation, and 1-2 comorbidities:  OP and previous lumpectomy surgery  are also affecting patient's functional outcome.    REHAB POTENTIAL: Good   CLINICAL DECISION MAKING: Stable/uncomplicated   EVALUATION COMPLEXITY: Low     GOALS: Goals reviewed with patient? Yes   SHORT TERM GOALS: Target date: 02/23/2023    Patient will be independent with initial HEP.  Baseline:  Goal status: MET (02/01/23)       LONG TERM GOALS: Target date: 04/30/23 Patient will be independent with advanced/ongoing HEP to improve outcomes and carryover.  Baseline:  further advancement is needed (03/18/23) Goal status: In progress    2.  Patient will report 90% improvement in left neck and shoulder pain to improve QOL.  Baseline: 75% (03/18/23) Goal status: revised    3.  Patient will demonstrate functional pain free cervical ROM for safety with driving.  Baseline: see above  Goal status: In progress    4.  Patient will demonstrate improved posture to decrease muscle imbalance. Baseline: difficulty maintaining posture due to functional weakness (03/18/23) Goal status: INITIAL   5.  Patient will report 58 on FOTO to demonstrate improved functional ability.  Baseline: 61 (03/18/23) Goal status: MET   6.  Patient will demonstrate improved active shoulder flexion to 150 deg or better without pain to ease OH ADLS.   Baseline: 145 (03/18/23) Goal status: In progress    7. Patient will be able to sleep on Lt shoulder without waking from pain. Baseline: able to sleep on Lt side without pain (02/17/23) Goal status: MET     PLAN:   PT FREQUENCY: 2x/week   PT DURATION: 6 weeks   PLANNED INTERVENTIONS: Therapeutic exercises, Therapeutic activity, Neuromuscular re-education, Patient/Family education, Self Care, Joint mobilization, Dry Needling, Electrical stimulation, Cryotherapy, Moist heat, Taping, Ionotophoresis 4mg /ml Dexamethasone, and Manual therapy   PLAN FOR NEXT SESSION: finalize HEP, probable D/C to HEP  Lorrene Reid, PT 04/20/23 11:40  AM  PHYSICAL THERAPY DISCHARGE SUMMARY  Visits from Start of Care: 13  Current functional level related to goals / functional outcomes: See above for most current status.  Pt had to cancel her final session and agreed to D/C to HEP.    Remaining deficits: LT upper trap tension and intermittent pain related to activity.     Education / Equipment: HEP, posture   Patient agrees to discharge. Patient goals were partially met. Patient is being discharged due to being pleased with the current functional level.  Lorrene Reid, PT 04/21/23 4:35 PM

## 2023-04-27 ENCOUNTER — Ambulatory Visit: Payer: Medicare Other

## 2023-05-24 DIAGNOSIS — H524 Presbyopia: Secondary | ICD-10-CM | POA: Diagnosis not present

## 2023-05-24 DIAGNOSIS — H04123 Dry eye syndrome of bilateral lacrimal glands: Secondary | ICD-10-CM | POA: Diagnosis not present

## 2023-06-02 ENCOUNTER — Other Ambulatory Visit: Payer: Self-pay | Admitting: Family

## 2023-06-14 DIAGNOSIS — Z23 Encounter for immunization: Secondary | ICD-10-CM | POA: Diagnosis not present

## 2023-06-30 ENCOUNTER — Ambulatory Visit (INDEPENDENT_AMBULATORY_CARE_PROVIDER_SITE_OTHER): Payer: Medicare Other | Admitting: Family Medicine

## 2023-06-30 ENCOUNTER — Encounter: Payer: Self-pay | Admitting: Family Medicine

## 2023-06-30 VITALS — BP 152/82 | HR 81 | Temp 97.9°F | Wt 185.0 lb

## 2023-06-30 DIAGNOSIS — K5732 Diverticulitis of large intestine without perforation or abscess without bleeding: Secondary | ICD-10-CM

## 2023-06-30 DIAGNOSIS — R1031 Right lower quadrant pain: Secondary | ICD-10-CM | POA: Diagnosis not present

## 2023-06-30 LAB — POC URINALSYSI DIPSTICK (AUTOMATED)
Bilirubin, UA: NEGATIVE
Blood, UA: NEGATIVE
Clarity, UA: NEGATIVE
Color, UA: NEGATIVE
Glucose, UA: NEGATIVE
Ketones, UA: NEGATIVE
Leukocytes, UA: NEGATIVE
Nitrite, UA: NEGATIVE
Protein, UA: NEGATIVE
Spec Grav, UA: 1.015 (ref 1.010–1.025)
Urobilinogen, UA: 0.2 E.U./dL
pH, UA: 6 (ref 5.0–8.0)

## 2023-06-30 MED ORDER — METRONIDAZOLE 500 MG PO TABS: 500.0000 mg | ORAL_TABLET | Freq: Three times a day (TID) | ORAL | 0 refills | Status: DC

## 2023-06-30 MED ORDER — CIPROFLOXACIN HCL 500 MG PO TABS: 500.0000 mg | ORAL_TABLET | Freq: Two times a day (BID) | ORAL | 0 refills | Status: AC

## 2023-06-30 NOTE — Progress Notes (Signed)
   Subjective:    Patient ID: Shelly [name], female    DOB: Mar 31, 1946, 77 y.o.   MRN: [id number]  HPI Here for one week of lower abdominal pains that are different from any she has had before. She has a long hx of IBS which gives her diarrhea during flare ups. This always responds well to taking Librax. With her current pains, her stools have been normal and she gets no response from taking Librax. No fever. No nausea or vomiting. No urinary symptoms. Her colonoscopy in 2-22 showed numerous left sided diverticula.    Review of Systems  Constitutional: Negative.   Respiratory: Negative.    Cardiovascular: Negative.   Gastrointestinal:  Positive for abdominal pain. Negative for abdominal distention, blood in stool, constipation, diarrhea, nausea and vomiting.  Genitourinary: Negative.   Musculoskeletal:  Negative for back pain.       Objective:   Physical Exam Constitutional:      Appearance: Normal appearance. She is not ill-appearing.  Cardiovascular:     Rate and Rhythm: Normal rate and regular rhythm.     Pulses: Normal pulses.     Heart sounds: Normal heart sounds.  Pulmonary:     Effort: Pulmonary effort is normal.  Abdominal:     General: Abdomen is flat. Bowel sounds are normal. There is no distension.     Palpations: Abdomen is soft. There is no mass.     Tenderness: There is no right CVA tenderness, left CVA tenderness, guarding or rebound.     Hernia: No hernia is present.     Comments: She is tender in the LLQ and left flank  Neurological:     Mental Status: She is alert.           Assessment & Plan:  Diverticulitis, treat with 10 days of Cipro and Metronidazole. Follow up as needed.  Gershon Crane, MD

## 2023-06-30 NOTE — Addendum Note (Signed)
Addended by: Carola Rhine on: 06/30/2023 04:33 PM   Modules accepted: Orders

## 2023-07-05 ENCOUNTER — Ambulatory Visit (INDEPENDENT_AMBULATORY_CARE_PROVIDER_SITE_OTHER): Payer: Medicare Other | Admitting: Family Medicine

## 2023-07-05 ENCOUNTER — Encounter: Payer: Self-pay | Admitting: Family Medicine

## 2023-07-05 VITALS — BP 158/70 | HR 74 | Temp 98.2°F | Ht 65.5 in | Wt 188.1 lb

## 2023-07-05 DIAGNOSIS — R1030 Lower abdominal pain, unspecified: Secondary | ICD-10-CM | POA: Diagnosis not present

## 2023-07-05 DIAGNOSIS — I10 Essential (primary) hypertension: Secondary | ICD-10-CM

## 2023-07-05 NOTE — Progress Notes (Signed)
Established Patient Office Visit  Subjective   Patient ID: Shelly [name], female    DOB: [birth year]  Age: 77 y.o. MRN: [id number]  Chief Complaint  Patient presents with   Medical Management of Chronic Issues    HPI   Ms. Loeber is here today to discuss the following issues  Recent lower abdominal pain.  Presumptive diagnosis of acute diverticulitis.  She did have previous colonoscopy showing diverticula descending colon and sigmoid colon.  She was prescribed Cipro and metronidazole.  After reading about potential side effects with Cipro she never took the Cipro but did start the metronidazole.  Has a few days of antibiotic left.  She does feel significantly improved.  No fever.  Stable appetite.  Less abdominal pain.  Never diagnosed with acute diverticulitis previously.  Not confirmed on imaging.  No urinary symptoms.  No stool changes.  No bloody stools.  She has longstanding history of elevated blood pressure here.  She states at home her blood pressures are fairly consistently 130s systolic.  She was tried on multiple blood pressure medications previously including lisinopril which caused cough.  She was fairly certain that amlodipine also cause cough.  When she went on the angiotensin receptor blocker she had frequent bladder infections.  She basically declined further medications.  She did bring in her cuff to compare with ours once previously and we had very similar readings  Past Medical History:  Diagnosis Date   Allergy    Arthritis    wrist, shoulder    Cancer (HCC) 08/2019   left breast IDC   Diverticulitis    Endometrial polyp    Family history of breast cancer    GERD (gastroesophageal reflux disease)    occasional - diet controlled   Hx of adenomatous colonic polyps    Hyperlipidemia    IBS (irritable bowel syndrome)    Missed abortion    x 1 - resolved - no surgery   Osteoporosis    SVD (spontaneous vaginal delivery)    x 1   Transient vision disturbance  2019   was evaluated by ED, no findings   Vitamin D deficiency    Past Surgical History:  Procedure Laterality Date   BREAST EXCISIONAL BIOPSY Right 08/2019   Benign, lesion    BREAST LUMPECTOMY Left 09/2019   BREAST LUMPECTOMY WITH RADIOACTIVE SEED AND SENTINEL LYMPH NODE BIOPSY Left 10/06/2019   Procedure: LEFT BREAST LUMPECTOMY  X2 WITH RADIOACTIVE SEED X2  AND SENTINEL LYMPH NODE BIOPSY;  Surgeon: Griselda Miner, MD;  Location: [facility];  Service: General;  Laterality: Left;   BREAST LUMPECTOMY WITH RADIOACTIVE SEED LOCALIZATION Right 10/06/2019   Procedure: RIGHT BREAST LUMPECTOMY WITH RADIOACTIVE SEED LOCALIZATION;  Surgeon: Griselda Miner, MD;  Location: [facility];  Service: General;  Laterality: Right;   BRONCHIAL BIOPSY  12/27/2020   Procedure: BRONCHIAL BIOPSIES;  Surgeon: Josephine Igo, DO;  Location: [facility];  Service: Pulmonary;;   BRONCHIAL BRUSHINGS  12/27/2020   Procedure: BRONCHIAL BRUSHINGS;  Surgeon: Josephine Igo, DO;  Location: [facility];  Service: Pulmonary;;   BRONCHIAL NEEDLE ASPIRATION BIOPSY  12/27/2020   Procedure: BRONCHIAL NEEDLE ASPIRATION BIOPSIES;  Surgeon: Josephine Igo, DO;  Location: [facility];  Service: Pulmonary;;   BRONCHIAL WASHINGS  12/27/2020   Procedure: BRONCHIAL WASHINGS;  Surgeon: Josephine Igo, DO;  Location: [facility];  Service: Pulmonary;;   COLONOSCOPY     DIAGNOSTIC LAPAROSCOPY     cysts  DILATATION & CURETTAGE/HYSTEROSCOPY WITH TRUECLEAR N/A 10/31/2013   Procedure: DILATATION & CURETTAGE/HYSTEROSCOPY WITH TRUCLEAR;  Surgeon: Zelphia Cairo, MD;  Location: [facility];  Service: Gynecology;  Laterality: N/A;   DILATION AND CURETTAGE OF UTERUS     MASS EXCISION Left 11/06/2019   Procedure: EXCISION NIPPLE AND AREOLA  MARGIN LEFT BREAST;  Surgeon: Griselda Miner, MD;  Location: [facility];  Service: General;  Laterality: Left;   TONSILLECTOMY  1952   VIDEO BRONCHOSCOPY WITH  ENDOBRONCHIAL NAVIGATION Bilateral 12/27/2020   Procedure: VIDEO BRONCHOSCOPY WITH ENDOBRONCHIAL NAVIGATION;  Surgeon: Josephine Igo, DO;  Location: [facility];  Service: Pulmonary;  Laterality: Bilateral;   VIDEO BRONCHOSCOPY WITH ENDOBRONCHIAL ULTRASOUND Bilateral 12/27/2020   Procedure: VIDEO BRONCHOSCOPY WITH ENDOBRONCHIAL ULTRASOUND;  Surgeon: Josephine Igo, DO;  Location: [facility];  Service: Pulmonary;  Laterality: Bilateral;   WISDOM TOOTH EXTRACTION      reports that she quit smoking about 16 years ago. Her smoking use included cigarettes. She started smoking about 36 years ago. She has a 2 pack-year smoking history. She has never used smokeless tobacco. She reports current alcohol use of about 14.0 standard drinks of alcohol per week. She reports that she does not use drugs. family history includes Brain cancer in her paternal uncle; Breast cancer (age of onset: 32) in her cousin; Breast cancer (age of onset: 30) in her maternal aunt and maternal aunt; Cancer in her maternal grandfather, maternal uncle, and another family member; Colon polyps in her mother; Depression in her brother; Diabetes in her mother; Heart disease in an other family member; Hyperlipidemia in her father and mother; Hypertension in her mother and another family member; Leukemia (age of onset: 67) in her mother; Lung cancer in her father and maternal grandfather; Throat cancer in her paternal uncle; Uterine cancer in her paternal grandmother. Allergies  Allergen Reactions   Taxotere [Docetaxel] Swelling   Abraxane [Paclitaxel Protein-Bound Part] Other (See Comments)    3 days after receiving she had one episode of very high BP and HR. This happened again 2 days later during the night.    Crestor [Rosuvastatin Calcium] Itching    GI upset   Lipitor [Atorvastatin Calcium] Itching   Lisinopril Cough   Nsaids Other (See Comments)    GI upset   Pravastatin Itching    GI upset   Sulfonamide Derivatives Rash     Hives, itiching    Review of Systems  Constitutional:  Negative for chills, fever and malaise/fatigue.  Eyes:  Negative for blurred vision.  Respiratory:  Negative for shortness of breath.   Cardiovascular:  Negative for chest pain.  Gastrointestinal:  Negative for blood in stool, constipation, diarrhea, melena, nausea and vomiting.  Genitourinary:  Negative for dysuria.  Neurological:  Negative for dizziness, weakness and headaches.      Objective:     BP (!) 158/70 (BP Location: Left Arm, Patient Position: Sitting, Cuff Size: Normal)   Pulse 74   Temp 98.2 F (36.8 C) (Oral)   Ht 5' 5.5" (1.664 m)   Wt 188 lb 1.6 oz (85.3 kg)   SpO2 96%   BMI 30.83 kg/m  BP Readings from Last 3 Encounters:  07/05/23 (!) 158/70  06/30/23 (!) 152/82  03/23/23 (!) 155/72   Wt Readings from Last 3 Encounters:  07/05/23 188 lb 1.6 oz (85.3 kg)  06/30/23 185 lb (83.9 kg)  03/23/23 184 lb 6.4 oz (83.6 kg)      Physical Exam Vitals reviewed.  Constitutional:  Appearance: Normal appearance. She is well-developed.  Eyes:     Pupils: Pupils are equal, round, and reactive to light.  Neck:     Thyroid: No thyromegaly.     Vascular: No JVD.  Cardiovascular:     Rate and Rhythm: Normal rate and regular rhythm.     Heart sounds:     No gallop.  Pulmonary:     Effort: Pulmonary effort is normal. No respiratory distress.     Breath sounds: Normal breath sounds. No wheezing or rales.  Abdominal:     General: There is no distension.     Palpations: Abdomen is soft.     Tenderness: There is no abdominal tenderness. There is no guarding or rebound.  Musculoskeletal:     Cervical back: Neck supple.  Neurological:     Mental Status: She is alert.      No results found for any visits on 07/05/23.    The ASCVD Risk score (Arnett DK, et al., 2019) failed to calculate for the following reasons:   Cannot find a previous HDL lab   Cannot find a previous total cholesterol lab     Assessment & Plan:   #1 recent lower abdominal pain predominantly left lower quadrant.  Presumptive diagnosis of acute diverticulitis.  Patient never took Cipro for fear of side effects.  She is taking metronidazole.  She is significantly improved.  Will hold on taking quinolone at this time but explained if she has any recurrent pain, fever or other concerns would need to add gram-negative coverage.  Given her reluctance to take quinolones might consider other options if future occurrence such as Augmentin  #2 hypertension history.  Possible whitecoat syndrome.  Consistently better blood pressures at home.  Intolerance to multiple medications previously including lisinopril, amlodipine, and angiotensin receptor blocker.  We recommend she record home readings daily next 2 weeks and set up 2-week follow-up.  Bring in her cuff for comparison.  If control remains in doubt consider 24-hour ambulatory blood pressure monitor.  Return in about 2 weeks (around 07/19/2023).    Evelena Peat, MD

## 2023-07-05 NOTE — Patient Instructions (Signed)
Follow up for any recurrent abdominal pain or fever  Monitor blood pressure daily for next two weeks and record readings.

## 2023-07-07 ENCOUNTER — Telehealth: Payer: Self-pay | Admitting: Family Medicine

## 2023-07-07 NOTE — Telephone Encounter (Signed)
Pt called to say she has diverticulitis and was prescribed Ciprofloxacin  (CIPRO) 500 MG tablets. Pt is also taking metroNIDAZOLE (FLAGYL) 500 MG tablets. Pt states that after some research, she discovered that for some people her age, Ciprofloxacin could even cause death. Pt says she is afraid to take this medication and is wondering if MD could please send an alternate Rx?

## 2023-07-07 NOTE — Telephone Encounter (Signed)
Left a message for the patient to return my call.  

## 2023-07-08 MED ORDER — AMOXICILLIN-POT CLAVULANATE 875-125 MG PO TABS: 1.0000 | ORAL_TABLET | Freq: Two times a day (BID) | ORAL | 0 refills | Status: AC

## 2023-07-08 NOTE — Addendum Note (Signed)
Addended by: Christy Sartorius on: 07/08/2023 03:01 PM   Modules accepted: Orders

## 2023-07-08 NOTE — Telephone Encounter (Signed)
I spoke with the patient and informed her of the message below. Patient reported that she is not feeling better and is still having pain. Patient would like an alternative prescription sent in.

## 2023-07-08 NOTE — Telephone Encounter (Signed)
Patient aware and rx sent  

## 2023-07-20 ENCOUNTER — Ambulatory Visit (INDEPENDENT_AMBULATORY_CARE_PROVIDER_SITE_OTHER): Payer: Medicare Other | Admitting: Family Medicine

## 2023-07-20 ENCOUNTER — Encounter: Payer: Self-pay | Admitting: Family Medicine

## 2023-07-20 VITALS — BP 150/68 | HR 79 | Temp 98.2°F | Ht 65.0 in | Wt 186.6 lb

## 2023-07-20 DIAGNOSIS — K5792 Diverticulitis of intestine, part unspecified, without perforation or abscess without bleeding: Secondary | ICD-10-CM

## 2023-07-20 DIAGNOSIS — I10 Essential (primary) hypertension: Secondary | ICD-10-CM

## 2023-07-20 DIAGNOSIS — M818 Other osteoporosis without current pathological fracture: Secondary | ICD-10-CM

## 2023-07-20 NOTE — Progress Notes (Signed)
Established Patient Office Visit  Subjective   Patient ID: Shelly [name], female    DOB: Aug 02, 1946  Age: 77 y.o. MRN: [id number]  Chief Complaint  Patient presents with   Hypertension    HPI   [name] seen today for medical follow-up.  She has longstanding history of elevated blood pressures and has had intolerance with multiple medications in the past including ACE inhibitors, angiotensin receptor blockers, and calcium channel blockers.  Refer to prior note for details.  She brings in home readings today and these are consistently well-controlled.  She has suspected whitecoat syndrome.  She brings in her cuff today for comparison.  In reviewing recent home readings she only had 1 elevated reading of 178 systolic on the day when she felt poorly and had some acute illness.  She took lisinopril 10 mg daily leftover from prior prescription for a few days but felt "drained ".  Her blood pressures have been extremely well-controlled since then.  She had blood pressure this morning 121/59 which is fairly typical of most of her readings. She is currently not exercising but does have a treadmill and exercise bike at home.  Recent flare of presumed acute diverticulitis.  She had intolerance with Cipro and Flagyl and was switched to Augmentin and symptoms fully cleared.  No abdominal pain at this time.  No fever.  Osteoporosis history.  She is on Prolia injections and will be due for repeat injection soon and requesting that we get this scheduled for her.  Past Medical History:  Diagnosis Date   Allergy    Arthritis    wrist, shoulder    Cancer (HCC) 08/2019   left breast IDC   Diverticulitis    Endometrial polyp    Family history of breast cancer    GERD (gastroesophageal reflux disease)    occasional - diet controlled   Hx of adenomatous colonic polyps    Hyperlipidemia    IBS (irritable bowel syndrome)    Missed abortion    x 1 - resolved - no surgery   Osteoporosis    SVD  (spontaneous vaginal delivery)    x 1   Transient vision disturbance 2019   was evaluated by ED, no findings   Vitamin D deficiency    Past Surgical History:  Procedure Laterality Date   BREAST EXCISIONAL BIOPSY Right 08/2019   Benign, lesion    BREAST LUMPECTOMY Left 09/2019   BREAST LUMPECTOMY WITH RADIOACTIVE SEED AND SENTINEL LYMPH NODE BIOPSY Left 10/06/2019   Procedure: LEFT BREAST LUMPECTOMY  X2 WITH RADIOACTIVE SEED X2  AND SENTINEL LYMPH NODE BIOPSY;  Surgeon: Griselda Miner, MD;  Location: [facility];  Service: General;  Laterality: Left;   BREAST LUMPECTOMY WITH RADIOACTIVE SEED LOCALIZATION Right 10/06/2019   Procedure: RIGHT BREAST LUMPECTOMY WITH RADIOACTIVE SEED LOCALIZATION;  Surgeon: Griselda Miner, MD;  Location: [facility];  Service: General;  Laterality: Right;   BRONCHIAL BIOPSY  12/27/2020   Procedure: BRONCHIAL BIOPSIES;  Surgeon: Josephine Igo, DO;  Location: [facility];  Service: Pulmonary;;   BRONCHIAL BRUSHINGS  12/27/2020   Procedure: BRONCHIAL BRUSHINGS;  Surgeon: Josephine Igo, DO;  Location: [facility];  Service: Pulmonary;;   BRONCHIAL NEEDLE ASPIRATION BIOPSY  12/27/2020   Procedure: BRONCHIAL NEEDLE ASPIRATION BIOPSIES;  Surgeon: Josephine Igo, DO;  Location: [facility];  Service: Pulmonary;;   BRONCHIAL WASHINGS  12/27/2020   Procedure: BRONCHIAL WASHINGS;  Surgeon: Josephine Igo, DO;  Location: [facility];  Service: Pulmonary;;   COLONOSCOPY     DIAGNOSTIC LAPAROSCOPY     cysts   DILATATION & CURETTAGE/HYSTEROSCOPY WITH TRUECLEAR N/A 10/31/2013   Procedure: DILATATION & CURETTAGE/HYSTEROSCOPY WITH TRUCLEAR;  Surgeon: Zelphia Cairo, MD;  Location: [facility];  Service: Gynecology;  Laterality: N/A;   DILATION AND CURETTAGE OF UTERUS     MASS EXCISION Left 11/06/2019   Procedure: EXCISION NIPPLE AND AREOLA  MARGIN LEFT BREAST;  Surgeon: Griselda Miner, MD;  Location: [facility];  Service:  General;  Laterality: Left;   TONSILLECTOMY  1952   VIDEO BRONCHOSCOPY WITH ENDOBRONCHIAL NAVIGATION Bilateral 12/27/2020   Procedure: VIDEO BRONCHOSCOPY WITH ENDOBRONCHIAL NAVIGATION;  Surgeon: Josephine Igo, DO;  Location: [facility];  Service: Pulmonary;  Laterality: Bilateral;   VIDEO BRONCHOSCOPY WITH ENDOBRONCHIAL ULTRASOUND Bilateral 12/27/2020   Procedure: VIDEO BRONCHOSCOPY WITH ENDOBRONCHIAL ULTRASOUND;  Surgeon: Josephine Igo, DO;  Location: [facility];  Service: Pulmonary;  Laterality: Bilateral;   WISDOM TOOTH EXTRACTION      reports that she quit smoking about 16 years ago. Her smoking use included cigarettes. She started smoking about 36 years ago. She has a 2 pack-year smoking history. She has never used smokeless tobacco. She reports current alcohol use of about 14.0 standard drinks of alcohol per week. She reports that she does not use drugs. family history includes Brain cancer in her paternal uncle; Breast cancer (age of onset: 14) in her cousin; Breast cancer (age of onset: 25) in her maternal aunt and maternal aunt; Cancer in her maternal grandfather, maternal uncle, and another family member; Colon polyps in her mother; Depression in her brother; Diabetes in her mother; Heart disease in an other family member; Hyperlipidemia in her father and mother; Hypertension in her mother and another family member; Leukemia (age of onset: 43) in her mother; Lung cancer in her father and maternal grandfather; Throat cancer in her paternal uncle; Uterine cancer in her paternal grandmother. Allergies  Allergen Reactions   Taxotere [Docetaxel] Swelling   Abraxane [Paclitaxel Protein-Bound Part] Other (See Comments)    3 days after receiving she had one episode of very high BP and HR. This happened again 2 days later during the night.    Crestor [Rosuvastatin Calcium] Itching    GI upset   Lipitor [Atorvastatin Calcium] Itching   Lisinopril Cough   Nsaids Other (See Comments)    GI  upset   Pravastatin Itching    GI upset   Sulfonamide Derivatives Rash    Hives, itiching    Review of Systems  Constitutional:  Negative for malaise/fatigue.  Eyes:  Negative for blurred vision.  Respiratory:  Negative for shortness of breath.   Cardiovascular:  Negative for chest pain.  Neurological:  Negative for dizziness, weakness and headaches.  Psychiatric/Behavioral:  Positive for memory loss.       Objective:     BP (!) 150/68   Pulse 79   Temp 98.2 F (36.8 C) (Oral)   Ht 5\' 5"  (1.651 m)   Wt 186 lb 9.6 oz (84.6 kg)   SpO2 95%   BMI 31.05 kg/m  BP Readings from Last 3 Encounters:  07/20/23 (!) 150/68  07/07/23 (!) 158/70  06/30/23 (!) 152/82   Wt Readings from Last 3 Encounters:  07/20/23 186 lb 9.6 oz (84.6 kg)  07/05/23 188 lb 1.6 oz (85.3 kg)  06/30/23 185 lb (83.9 kg)      Physical Exam Vitals reviewed.  Constitutional:  General: She is not in acute distress.    Appearance: She is well-developed.  Eyes:     Pupils: Pupils are equal, round, and reactive to light.  Neck:     Thyroid: No thyromegaly.     Vascular: No JVD.  Cardiovascular:     Rate and Rhythm: Normal rate and regular rhythm.     Heart sounds:     No gallop.  Pulmonary:     Effort: Pulmonary effort is normal. No respiratory distress.     Breath sounds: Normal breath sounds. No wheezing or rales.  Musculoskeletal:     Cervical back: Neck supple.     Right lower leg: No edema.     Left lower leg: No edema.  Neurological:     Mental Status: She is alert.      No results found for any visits on 07/20/23.  Last CBC Lab Results  Component Value Date   WBC 5.7 03/23/2023   HGB 13.3 03/23/2023   HCT 39.7 03/23/2023   MCV 88.6 03/23/2023   MCH 29.7 03/23/2023   RDW 13.6 03/23/2023   PLT 276 03/23/2023   Last metabolic panel Lab Results  Component Value Date   GLUCOSE 108 (H) 03/23/2023   NA 136 03/23/2023   K 4.0 03/23/2023   CL 102 03/23/2023   CO2 28  03/23/2023   BUN 19 03/23/2023   CREATININE 0.85 03/23/2023   GFRNONAA >60 03/23/2023   CALCIUM 9.1 03/23/2023   PROT 6.9 03/23/2023   ALBUMIN 3.7 03/23/2023   BILITOT 0.6 03/23/2023   ALKPHOS 66 03/23/2023   AST 15 03/23/2023   ALT 17 03/23/2023   ANIONGAP 6 03/23/2023   Last lipids Lab Results  Component Value Date   CHOL 166 09/16/2011   HDL 58.90 09/16/2011   LDLCALC 87 09/16/2011   LDLDIRECT 215.9 06/04/2011   TRIG 103.0 09/16/2011   CHOLHDL 3 09/16/2011   Last hemoglobin A1c Lab Results  Component Value Date   HGBA1C 6.6 (H) 08/14/2020      The ASCVD Risk score (Arnett DK, et al., 2019) failed to calculate for the following reasons:   Cannot find a previous HDL lab   Cannot find a previous total cholesterol lab    Assessment & Plan:   #1 hypertension.  Probable whitecoat syndrome.  She brings in her cuff today and we obtain reading left arm seated 166/81 with her cuff and 162/78 with our cuff.  Suspect her home readings are fairly accurate.  She has had consistently well-controlled home readings.  She will continue to monitor. -Reiterated the importance of low-sodium diet -Establish more consistent exercise -We have challenged her again to try to lose more weight  #2 osteoporosis.  Patient on Prolia injections.  She takes calcium and vitamin D supplements.  Will schedule her next Prolia which is due in October  #3 recent abdominal pain.  Presumed acute diverticulitis.  Symptoms resolved following Augmentin.  Follow-up for any recurrent pain   Evelena Peat, MD

## 2023-07-20 NOTE — Patient Instructions (Addendum)
Step up exercise, as discussed  Looks like your BP cuff is accurate.  Continue to monitor.   Remember Prolia injection for October.   Remember Flu vaccine

## 2023-07-21 DIAGNOSIS — Z23 Encounter for immunization: Secondary | ICD-10-CM | POA: Diagnosis not present

## 2023-08-06 ENCOUNTER — Ambulatory Visit (INDEPENDENT_AMBULATORY_CARE_PROVIDER_SITE_OTHER): Payer: Medicare Other | Admitting: Family Medicine

## 2023-08-06 ENCOUNTER — Ambulatory Visit
Admission: RE | Admit: 2023-08-06 | Discharge: 2023-08-06 | Disposition: A | Discharge status: Home / Self Care | Payer: Medicare Other | Source: Ambulatory Visit | Attending: Family Medicine | Admitting: Family Medicine

## 2023-08-06 ENCOUNTER — Telehealth: Payer: Self-pay | Admitting: Family Medicine

## 2023-08-06 ENCOUNTER — Encounter: Payer: Self-pay | Admitting: Family Medicine

## 2023-08-06 VITALS — BP 162/78 | HR 75 | Temp 97.8°F | Ht 65.0 in | Wt 187.0 lb

## 2023-08-06 DIAGNOSIS — I10 Essential (primary) hypertension: Secondary | ICD-10-CM

## 2023-08-06 DIAGNOSIS — S92512A Displaced fracture of proximal phalanx of left lesser toe(s), initial encounter for closed fracture: Secondary | ICD-10-CM | POA: Diagnosis not present

## 2023-08-06 DIAGNOSIS — S90122A Contusion of left lesser toe(s) without damage to nail, initial encounter: Secondary | ICD-10-CM

## 2023-08-06 NOTE — Telephone Encounter (Signed)
08/06/23 - Pt called stating she saw Dr Doreene Burke earlier today, she was to take a trip today with her husband, but unfortunately could not due to hip pain and all round body pain, and was wanting to know if Dr Doreene Burke could fill out a form for her insurance company stating that she was unable to make her trip today, to enable her insurance do a refund for her trip. I spoke with Porfirio Mylar, and she advise pt to speaks with her PCP to fill out the form for her since she was not seen today for anything acute.

## 2023-08-06 NOTE — Progress Notes (Unsigned)
Established Patient Office Visit   Subjective:  Patient ID: Shelly [name], female    DOB: 1946-01-19  Age: 77 y.o. MRN: [id number]  Chief Complaint  Patient presents with   Toe Injury    Left 2nd toe injury x 1 day.     HPI Encounter Diagnoses  Name Primary?   Contusion of lesser toe of left foot without damage to nail, initial encounter Yes   Essential hypertension    Stubbed her left second toe on the door post last night.  It is swollen and tender.  She used ice and has been taking Tylenol.  Longstanding history of hypertension.  Blood pressure at home is lower with systolics running in the 130-140 range.  Her BP cuff has been calibrated with a clinic cuff. {History (Optional):23778}  Review of Systems  Constitutional: Negative.   HENT: Negative.    Eyes:  Negative for blurred vision, discharge and redness.  Respiratory: Negative.    Cardiovascular: Negative.   Gastrointestinal:  Negative for abdominal pain.  Genitourinary: Negative.   Musculoskeletal:  Positive for joint pain. Negative for myalgias.  Skin:  Negative for rash.  Neurological:  Negative for tingling, loss of consciousness and weakness.  Endo/Heme/Allergies:  Negative for polydipsia.     Current Outpatient Medications:    acetaminophen (TYLENOL) 500 MG tablet, Take 500 mg by mouth every 6 (six) hours as needed for headache or mild pain., Disp: , Rfl:    calcium-vitamin D (OSCAL WITH D) 500-5 MG-MCG tablet, Take 1 tablet by mouth daily., Disp: , Rfl:    clidinium-chlordiazePOXIDE (LIBRAX) 5-2.5 MG capsule, TAKE 1 CAPSULE EVERY DAY AS NEEDED, Disp: 90 capsule, Rfl: 0   denosumab (PROLIA) 60 MG/ML SOSY injection, Inject 60 mg into the skin every 6 (six) months. Last inj was in October, Disp: , Rfl:    loratadine (CLARITIN) 10 MG tablet, Take 10 mg by mouth daily., Disp: , Rfl:    OVER THE COUNTER MEDICATION, Tums-as needed, Disp: , Rfl:    Objective:     BP (!) 162/78   Pulse 75   Temp 97.8 F  (36.6 C)   Ht 5\' 5"  (1.651 m)   Wt 187 lb (84.8 kg)   SpO2 95%   BMI 31.12 kg/m  {Vitals History (Optional):23777}  Physical Exam Constitutional:      General: She is not in acute distress.    Appearance: Normal appearance. She is not ill-appearing, toxic-appearing or diaphoretic.  HENT:     Head: Normocephalic and atraumatic.     Right Ear: External ear normal.     Left Ear: External ear normal.  Eyes:     General: No scleral icterus.       Right eye: No discharge.        Left eye: No discharge.     Extraocular Movements: Extraocular movements intact.     Conjunctiva/sclera: Conjunctivae normal.  Pulmonary:     Effort: Pulmonary effort is normal. No respiratory distress.  Musculoskeletal:     Left foot: Normal capillary refill. Swelling and tenderness present. No deformity. Normal pulse.     Comments: Left second toe is swollen without deformity or ecchymosis.  Left foot is cavus.  Skin:    General: Skin is warm and dry.  Neurological:     Mental Status: She is alert and oriented to person, place, and time.  Psychiatric:        Mood and Affect: Mood normal.        Behavior:  Behavior normal.      No results found for any visits on 08/06/23.  {Labs (Optional):23779}  The ASCVD Risk score (Arnett DK, et al., 2019) failed to calculate for the following reasons:   Cannot find a previous HDL lab   Cannot find a previous total cholesterol lab    Assessment & Plan:   Contusion of lesser toe of left foot without damage to nail, initial encounter -     DG Foot Complete Left; Future  Essential hypertension    No follow-ups on file.  Buddy tape with hard shoe.  Will check x-ray.  Elevation with icing for 15 minutes 3 times daily over the next few days.  Follow-up with primary as planned for hypertension.  Mliss Sax, MD

## 2023-08-13 ENCOUNTER — Encounter: Payer: Self-pay | Admitting: Family Medicine

## 2023-08-13 ENCOUNTER — Ambulatory Visit (INDEPENDENT_AMBULATORY_CARE_PROVIDER_SITE_OTHER): Payer: Medicare Other | Admitting: Family Medicine

## 2023-08-13 ENCOUNTER — Ambulatory Visit: Payer: Medicare Other | Admitting: Family Medicine

## 2023-08-13 VITALS — BP 136/78 | HR 73 | Temp 98.1°F | Wt 181.0 lb

## 2023-08-13 DIAGNOSIS — S92515D Nondisplaced fracture of proximal phalanx of left lesser toe(s), subsequent encounter for fracture with routine healing: Secondary | ICD-10-CM | POA: Diagnosis not present

## 2023-08-13 NOTE — Progress Notes (Signed)
Subjective:    Patient ID: Shelly [name], female    DOB: 1946-10-13, 77 y.o.   MRN: [id number]  HPI Here to follow up on an injury to the left toes. On 08-05-23 at home she stubbed her foot against a door post. She quickly had pain and swelling around the 2nd toe. She was seen in the office the next day and Xrays were taken. She was told to stay off her foot, elevate it, and ice it. However she had planned to go on a short vacation with her grandchildren that weekend so she went, and she ended up being on her feet more than she planned to. Now she has had pain and swelling this week. Today it feels better. She is taking Tylenol.    Review of Systems  Constitutional: Negative.   Respiratory: Negative.    Cardiovascular: Negative.   Musculoskeletal:  Positive for arthralgias.       Objective:   Physical Exam Constitutional:      Comments: Walks with a slight limp.   Cardiovascular:     Rate and Rhythm: Normal rate and regular rhythm.     Pulses: Normal pulses.     Heart sounds: Normal heart sounds.  Pulmonary:     Effort: Pulmonary effort is normal.     Breath sounds: Normal breath sounds.  Musculoskeletal:     Comments: The base of the left 2nd toe is swollen and tender. No crepitus. The toes have full ROM and good alignment. The Xrays taken last week show a fracture through the proximal phalanx of the left 2nd toe that does not extend to the joint space. The 2nd and 3rd toes were buddy wrapped together, and this was removed.   Neurological:     Mental Status: She is alert.           Assessment & Plan:  Toe fracture. This should heal just fine over the next few weeks. She will stay off it this weekend and elevate it. Apply ice and take Tylenol. Recheck as needed. We spent a total of ( 31  ) minutes reviewing records and discussing these issues.   Gershon Crane, MD

## 2023-08-19 DIAGNOSIS — Z23 Encounter for immunization: Secondary | ICD-10-CM | POA: Diagnosis not present

## 2023-08-23 ENCOUNTER — Ambulatory Visit (INDEPENDENT_AMBULATORY_CARE_PROVIDER_SITE_OTHER): Payer: Medicare Other

## 2023-08-23 VITALS — Ht 65.0 in | Wt 181.0 lb

## 2023-08-23 DIAGNOSIS — Z0000 Encounter for general adult medical examination without abnormal findings: Secondary | ICD-10-CM

## 2023-08-23 NOTE — Progress Notes (Signed)
Subjective:   Shelly [name] is a 77 y.o. female who presents for Medicare Annual (Subsequent) preventive examination.  Visit Complete: Virtual  I connected with  Shelly [name] on 08/23/23 by a audio enabled telemedicine application and verified that I am speaking with the correct person using two identifiers.  Patient Location: Home  Provider Location: Home Office  I discussed the limitations of evaluation and management by telemedicine. The patient expressed understanding and agreed to proceed.   Because this visit was a virtual/telehealth visit, some criteria may be missing or patient reported. Any vitals not documented were not able to be obtained and vitals that have been documented are patient reported.   Cardiac Risk Factors include: advanced age (>52men, >75 women);hypertension     Objective:    Today's Vitals   08/23/23 1511  Weight: 181 lb (82.1 kg)  Height: 5\' 5"  (1.651 m)   Body mass index is 30.12 kg/m.     08/23/2023    3:19 PM 01/26/2023    8:12 AM 11/24/2022   11:32 AM 08/17/2022    9:16 AM 08/15/2021   10:01 AM 12/27/2020    6:16 AM 03/06/2020   11:29 AM  Advanced Directives  Does Patient Have a Medical Advance Directive? Yes Yes Yes Yes Yes Yes Yes  Type of Estate agent of [place];Living will Healthcare Power of [place];Living will Living will;Healthcare Power of [employer] Power of [place];Living will Healthcare Power of [place];Living will Living will;Healthcare Power of Attorney   Does patient want to make changes to medical advance directive?  No - Patient declined No - Patient declined      Copy of Healthcare Power of Attorney in Chart? No - copy requested No - copy requested  No - copy requested No - copy requested      Current Medications (verified) Outpatient Encounter Medications as of 08/23/2023  Medication Sig   acetaminophen (TYLENOL) 500 MG tablet Take 500 mg by mouth every 6 (six) hours as needed for  headache or mild pain.   calcium-vitamin D (OSCAL WITH D) 500-5 MG-MCG tablet Take 1 tablet by mouth daily.   clidinium-chlordiazePOXIDE (LIBRAX) 5-2.5 MG capsule TAKE 1 CAPSULE EVERY DAY AS NEEDED   denosumab (PROLIA) 60 MG/ML SOSY injection Inject 60 mg into the skin every 6 (six) months. Last inj was in October   loratadine (CLARITIN) 10 MG tablet Take 10 mg by mouth daily.   OVER THE COUNTER MEDICATION Tums-as needed   [DISCONTINUED] omeprazole (PRILOSEC) 40 MG capsule Take 40 mg by mouth daily.   [DISCONTINUED] pravastatin (PRAVACHOL) 40 MG tablet Take 1 tablet (40 mg total) by mouth every evening.   [DISCONTINUED] prochlorperazine (COMPAZINE) 10 MG tablet Take 1 tablet (10 mg total) by mouth every 6 (six) hours as needed (Nausea or vomiting).   No facility-administered encounter medications on file as of 08/23/2023.    Allergies (verified) Taxotere [docetaxel], Abraxane [paclitaxel protein-bound part], Crestor [rosuvastatin calcium], Lipitor [atorvastatin calcium], Lisinopril, Nsaids, Pravastatin, and Sulfonamide derivatives   History: Past Medical History:  Diagnosis Date   Allergy    Arthritis    wrist, shoulder    Cancer (HCC) 08/2019   left breast IDC   Diverticulitis    Endometrial polyp    Family history of breast cancer    GERD (gastroesophageal reflux disease)    occasional - diet controlled   Hx of adenomatous colonic polyps    Hyperlipidemia    IBS (irritable bowel syndrome)    Missed abortion  x 1 - resolved - no surgery   Osteoporosis    SVD (spontaneous vaginal delivery)    x 1   Transient vision disturbance 2019   was evaluated by ED, no findings   Vitamin D deficiency    Past Surgical History:  Procedure Laterality Date   BREAST EXCISIONAL BIOPSY Right 08/2019   Benign, lesion    BREAST LUMPECTOMY Left 09/2019   BREAST LUMPECTOMY WITH RADIOACTIVE SEED AND SENTINEL LYMPH NODE BIOPSY Left 10/06/2019   Procedure: LEFT BREAST LUMPECTOMY  X2 WITH  RADIOACTIVE SEED X2  AND SENTINEL LYMPH NODE BIOPSY;  Surgeon: Griselda Miner, MD;  Location: [facility];  Service: General;  Laterality: Left;   BREAST LUMPECTOMY WITH RADIOACTIVE SEED LOCALIZATION Right 10/06/2019   Procedure: RIGHT BREAST LUMPECTOMY WITH RADIOACTIVE SEED LOCALIZATION;  Surgeon: Griselda Miner, MD;  Location: [facility];  Service: General;  Laterality: Right;   BRONCHIAL BIOPSY  12/27/2020   Procedure: BRONCHIAL BIOPSIES;  Surgeon: Josephine Igo, DO;  Location: [facility];  Service: Pulmonary;;   BRONCHIAL BRUSHINGS  12/27/2020   Procedure: BRONCHIAL BRUSHINGS;  Surgeon: Josephine Igo, DO;  Location: [facility];  Service: Pulmonary;;   BRONCHIAL NEEDLE ASPIRATION BIOPSY  12/27/2020   Procedure: BRONCHIAL NEEDLE ASPIRATION BIOPSIES;  Surgeon: Josephine Igo, DO;  Location: [facility];  Service: Pulmonary;;   BRONCHIAL WASHINGS  12/27/2020   Procedure: BRONCHIAL WASHINGS;  Surgeon: Josephine Igo, DO;  Location: [facility];  Service: Pulmonary;;   COLONOSCOPY     DIAGNOSTIC LAPAROSCOPY     cysts   DILATATION & CURETTAGE/HYSTEROSCOPY WITH TRUECLEAR N/A 10/31/2013   Procedure: DILATATION & CURETTAGE/HYSTEROSCOPY WITH TRUCLEAR;  Surgeon: Zelphia Cairo, MD;  Location: [facility];  Service: Gynecology;  Laterality: N/A;   DILATION AND CURETTAGE OF UTERUS     MASS EXCISION Left 11/06/2019   Procedure: EXCISION NIPPLE AND AREOLA  MARGIN LEFT BREAST;  Surgeon: Griselda Miner, MD;  Location: [facility];  Service: General;  Laterality: Left;   TONSILLECTOMY  1952   VIDEO BRONCHOSCOPY WITH ENDOBRONCHIAL NAVIGATION Bilateral 12/27/2020   Procedure: VIDEO BRONCHOSCOPY WITH ENDOBRONCHIAL NAVIGATION;  Surgeon: Josephine Igo, DO;  Location: [facility];  Service: Pulmonary;  Laterality: Bilateral;   VIDEO BRONCHOSCOPY WITH ENDOBRONCHIAL ULTRASOUND Bilateral 12/27/2020   Procedure: VIDEO BRONCHOSCOPY WITH ENDOBRONCHIAL ULTRASOUND;   Surgeon: Josephine Igo, DO;  Location: [facility];  Service: Pulmonary;  Laterality: Bilateral;   WISDOM TOOTH EXTRACTION     Family History  Problem Relation Age of Onset   Hyperlipidemia Mother    Hypertension Mother    Diabetes Mother        type ll   Leukemia Mother 60   Colon polyps Mother    Lung cancer Father    Hyperlipidemia Father    Breast cancer Cousin 30       maternal first couin   Heart disease Other    Hypertension Other    Cancer Other    Depression Brother    Breast cancer Maternal Aunt 54   Uterine cancer Paternal Grandmother    Breast cancer Maternal Aunt 50   Lung cancer Maternal Grandfather    Cancer Maternal Grandfather        gastric cancer?   Cancer Maternal Uncle        unknown   Brain cancer Paternal Uncle    Throat cancer Paternal Uncle    Colon cancer Neg Hx    Esophageal cancer Neg Hx  Stomach cancer Neg Hx    Rectal cancer Neg Hx    Social History   Socioeconomic History   Marital status: Married    Spouse name: Not on file   Number of children: 1   Years of education: Not on file   Highest education level: Not on file  Occupational History   Not on file  Tobacco Use   Smoking status: Former    Current packs/day: 0.00    Average packs/day: 0.1 packs/day for 20.0 years (2.0 ttl pk-yrs)    Types: Cigarettes    Start date: 05/15/1987    Quit date: 05/15/2007    Years since quitting: 16.2   Smokeless tobacco: Never  Vaping Use   Vaping status: Never Used  Substance and Sexual Activity   Alcohol use: Yes    Alcohol/week: 14.0 standard drinks of alcohol    Types: 14 Glasses of wine per week    Comment: 2 glasses wine every evening   Drug use: No   Sexual activity: Yes    Birth control/protection: Post-menopausal  Other Topics Concern   Not on file  Social History Narrative   Lives with husband in 2 story house   Has one daughter, with 3 grandchildren, local, supportive   Enjoys gardening/walking outside with husband    Occasionally does home exercise calisthenics but not recently.   Well-balanced diet overall   Social Determinants of Health   Financial Resource Strain: Low Risk  (08/23/2023)   Overall Financial Resource Strain (CARDIA)    Difficulty of Paying Living Expenses: Not hard at all  Food Insecurity: No Food Insecurity (08/23/2023)   Hunger Vital Sign    Worried About Running Out of Food in the Last Year: Never true    Ran Out of Food in the Last Year: Never true  Transportation Needs: No Transportation Needs (08/23/2023)   PRAPARE - Administrator, Civil Service (Medical): No    Lack of Transportation (Non-Medical): No  Physical Activity: Inactive (08/23/2023)   Exercise Vital Sign    Days of Exercise per Week: 0 days    Minutes of Exercise per Session: 0 min  Stress: No Stress Concern Present (08/23/2023)   [employer] of Occupational Health - Occupational Stress Questionnaire    Feeling of Stress : Not at all  Social Connections: Moderately Isolated (08/23/2023)   Social Connection and Isolation Panel [NHANES]    Frequency of Communication with Friends and Family: More than three times a week    Frequency of Social Gatherings with Friends and Family: More than three times a week    Attends Religious Services: Never    Database administrator or Organizations: No    Attends Engineer, structural: Never    Marital Status: Married    Tobacco Counseling Counseling given: Not Answered   Clinical Intake:  Pre-visit preparation completed: Yes  Pain : No/denies pain     BMI - recorded: 30.12 Nutritional Status: BMI > 30  Obese Nutritional Risks: None Diabetes: No  How often do you need to have someone help you when you read instructions, pamphlets, or other written materials from your doctor or pharmacy?: 1 - Never  Interpreter Needed?: No  Information entered by :: Theresa Mulligan LPN   Activities of Daily Living    08/23/2023    3:17 PM  In your  present state of health, do you have any difficulty performing the following activities:  Hearing? 0  Vision? 0  Difficulty concentrating  or making decisions? 0  Walking or climbing stairs? 0  Dressing or bathing? 0  Doing errands, shopping? 0  Preparing Food and eating ? N  Using the Toilet? N  In the past six months, have you accidently leaked urine? N  Do you have problems with loss of bowel control? N  Managing your Medications? N  Managing your Finances? N  Housekeeping or managing your Housekeeping? N    Patient Care Team: Kristian Covey, MD as PCP - General (Family Medicine) Antonietta Barcelona, OD as Referring Physician (Optometry) Griselda Miner, MD as Consulting Physician (General Surgery) Pershing Proud, RN as Oncology Nurse Navigator Donnelly Angelica, RN as Oncology Nurse Navigator Malachy Mood, MD as Consulting Physician (Hematology) Pollyann Samples, NP as Nurse Practitioner (Nurse Practitioner) Lonie Peak, MD as Attending Physician (Radiation Oncology)  Indicate any recent Medical Services you may have received from other than [facility] providers in the past year (date may be approximate).     Assessment:   This is a routine wellness examination for Shelly [name].  Hearing/Vision screen Hearing Screening - Comments:: Denies hearing difficulties   Vision Screening - Comments:: Wears rx glasses - up to date with routine eye exams with  [facility]   Goals Addressed               This Visit's Progress     Stay active in personal life (pt-stated)         Depression Screen    08/23/2023    3:17 PM 11/24/2022   11:29 AM 08/17/2022    9:17 AM 01/12/2022    3:08 PM 08/15/2021    9:59 AM 08/15/2021    9:58 AM 08/14/2020    9:24 AM  PHQ 2/9 Scores  PHQ - 2 Score 0 0 0 0 0 0 0  PHQ- 9 Score       0    Fall Risk    08/23/2023    3:18 PM 08/17/2022    9:17 AM 01/12/2022    3:08 PM 08/15/2021   10:02 AM 08/14/2020    9:25 AM  Fall Risk   Falls in the past  year? 0 0 0 0 0  Number falls in past yr: 0 0 0 0   Injury with Fall? 0 0 0 0   Risk for fall due to : No Fall Risks Medication side effect No Fall Risks    Follow up Falls prevention discussed Falls prevention discussed;Falls evaluation completed;Education provided Falls evaluation completed Falls evaluation completed     MEDICARE RISK AT HOME: Medicare Risk at Home Any stairs in or around the home?: Yes If so, are there any without handrails?: No Home free of loose throw rugs in walkways, pet beds, electrical cords, etc?: Yes Adequate lighting in your home to reduce risk of falls?: Yes Life alert?: No Use of a cane, walker or w/c?: No Grab bars in the bathroom?: Yes Shower chair or bench in shower?: Yes Elevated toilet seat or a handicapped toilet?: No  TIMED UP AND GO:  Was the test performed?  No    Cognitive Function:    01/07/2018    3:24 PM  MMSE - Mini Mental State Exam  Not completed: --        08/23/2023    3:19 PM 08/17/2022    9:20 AM  6CIT Screen  What Year? 0 points 0 points  What month? 0 points 0 points  What time? 0 points 0 points  Count back from 20 0 points 0 points  Months in reverse 0 points 0 points  Repeat phrase 0 points 2 points  Total Score 0 points 2 points    Immunizations Immunization History  Administered Date(s) Administered   Fluad Quad(high Dose 65+) 08/14/2020, 08/19/2021, 08/12/2022   Influenza Split 08/05/2011, 07/28/2012   Influenza, High Dose Seasonal PF 07/30/2015, 08/07/2016, 08/11/2017, 08/05/2018, 08/17/2019   Influenza,inj,Quad PF,6+ Mos 08/01/2013, 07/18/2014   Influenza-Unspecified 08/17/2019, 08/16/2021   PFIZER Comirnaty(Gray Top)Covid-19 Tri-Sucrose Vaccine 11/18/2019, 12/26/2019, 02/21/2021   PFIZER(Purple Top)SARS-COV-2 Vaccination 08/12/2020   PNEUMOCOCCAL CONJUGATE-20 06/14/2023   Pneumococcal Conjugate-13 05/14/2014   Pneumococcal Polysaccharide-23 06/05/2011   Pneumococcal-Unspecified 04/30/2017   Td  12/15/2003   Tdap 05/14/2014   Unspecified SARS-COV-2 Vaccination 07/26/2021, 08/08/2022   Zoster Recombinant(Shingrix) 08/22/2019, 07/15/2022, 10/28/2022   Zoster, Live 03/13/2013, 08/01/2019    TDAP status: Up to date  Flu Vaccine status: Up to date  Pneumococcal vaccine status: Up to date  Covid-19 vaccine status: Declined, Education has been provided regarding the importance of this vaccine but patient still declined. Advised may receive this vaccine at local pharmacy or Health Dept.or vaccine clinic. Aware to provide a copy of the vaccination record if obtained from local pharmacy or Health Dept. Verbalized acceptance and understanding.  Qualifies for Shingles Vaccine? Yes   Zostavax completed Yes   Shingrix Completed?: Yes  Screening Tests Health Maintenance  Topic Date Due   COVID-19 Vaccine (7 - 2023-24 season) 07/18/2023   DTaP/Tdap/Td (3 - Td or Tdap) 05/14/2024   Medicare Annual Wellness (AWV)  08/22/2024   Colonoscopy  05/20/2026   Pneumonia Vaccine 21+ Years old  Completed   INFLUENZA VACCINE  Completed   DEXA SCAN  Completed   Hepatitis C Screening  Completed   Zoster Vaccines- Shingrix  Completed   HPV VACCINES  Aged Out    Health Maintenance  Health Maintenance Due  Topic Date Due   COVID-19 Vaccine (7 - 2023-24 season) 07/18/2023    Colorectal cancer screening: Type of screening: Colonoscopy. Completed 05/20/21. Repeat every 3 years    Bone Density status: Completed 08/22/21. Results reflect: Bone density results: OSTEOPOROSIS. Repeat every   years.     Additional Screening:  Hepatitis C Screening: does qualify; Completed 08/14/20  Vision Screening: Recommended annual ophthalmology exams for early detection of glaucoma and other disorders of the eye. Is the patient up to date with their annual eye exam?  Yes  Who is the provider or what is the name of the office in which the patient attends annual eye exams? [facility] If pt is not  established with a provider, would they like to be referred to a provider to establish care? No .   Dental Screening: Recommended annual dental exams for proper oral hygiene    Community Resource Referral / Chronic Care Management:  CRR required this visit?  No   CCM required this visit?  No     Plan:     I have personally reviewed and noted the following in the patient's chart:   Medical and social history Use of alcohol, tobacco or illicit drugs  Current medications and supplements including opioid prescriptions. Patient is not currently taking opioid prescriptions. Functional ability and status Nutritional status Physical activity Advanced directives List of other physicians Hospitalizations, surgeries, and ER visits in previous 12 months Vitals Screenings to include cognitive, depression, and falls Referrals and appointments  In addition, I have reviewed and discussed with patient certain preventive protocols, quality metrics, and  best practice recommendations. A written personalized care plan for preventive services as well as general preventive health recommendations were provided to patient.     Tillie Rung, LPN   16/11/958   After Visit Summary: (MyChart) Due to this being a telephonic visit, the after visit summary with patients personalized plan was offered to patient via MyChart   Nurse Notes: None

## 2023-08-23 NOTE — Patient Instructions (Addendum)
Shelly [name] , Thank you for taking time to come for your Medicare Wellness Visit. I appreciate your ongoing commitment to your health goals. Please review the following plan we discussed and let me know if I can assist you in the future.   Referrals/Orders/Follow-Ups/Clinician Recommendations:   This is a list of the screening recommended for you and due dates:  Health Maintenance  Topic Date Due   COVID-19 Vaccine (7 - 2023-24 season) 07/18/2023   DTaP/Tdap/Td vaccine (3 - Td or Tdap) 05/14/2024   Medicare Annual Wellness Visit  08/22/2024   Colon Cancer Screening  05/20/2026   Pneumonia Vaccine  Completed   Flu Shot  Completed   DEXA scan (bone density measurement)  Completed   Hepatitis C Screening  Completed   Zoster (Shingles) Vaccine  Completed   HPV Vaccine  Aged Out    Advanced directives: (Copy Requested) Please bring a copy of your health care power of attorney and living will to the office to be added to your chart at your convenience.  Next Medicare Annual Wellness Visit scheduled for next year: Yes

## 2023-08-26 ENCOUNTER — Ambulatory Visit
Admission: RE | Admit: 2023-08-26 | Discharge: 2023-08-26 | Disposition: A | Discharge status: Home / Self Care | Payer: Medicare Other | Source: Ambulatory Visit | Attending: Nurse Practitioner | Admitting: Nurse Practitioner

## 2023-08-26 DIAGNOSIS — Z170 Estrogen receptor positive status [ER+]: Secondary | ICD-10-CM

## 2023-08-26 DIAGNOSIS — N6489 Other specified disorders of breast: Secondary | ICD-10-CM | POA: Diagnosis not present

## 2023-08-30 NOTE — Telephone Encounter (Signed)
error 

## 2023-09-02 ENCOUNTER — Ambulatory Visit: Payer: Medicare Other

## 2023-09-02 DIAGNOSIS — M818 Other osteoporosis without current pathological fracture: Secondary | ICD-10-CM | POA: Diagnosis not present

## 2023-09-02 MED ORDER — DENOSUMAB 60 MG/ML ~~LOC~~ SOSY
60.0000 mg | PREFILLED_SYRINGE | Freq: Once | SUBCUTANEOUS | Status: AC
  Administered 2023-09-02: 60 mg via SUBCUTANEOUS

## 2023-09-02 NOTE — Progress Notes (Signed)
Per orders of Dr. Ardyth Harps, injection of Denosumab 60mg /ml  given by Cindy Fullman L Quinetta Shilling. Patient tolerated injection well.

## 2023-09-21 DIAGNOSIS — M79672 Pain in left foot: Secondary | ICD-10-CM | POA: Insufficient documentation

## 2023-09-22 NOTE — Progress Notes (Signed)
Patient Care Team: Kristian Covey, MD as PCP - General (Family Medicine) Antonietta Barcelona, OD as Referring Physician (Optometry) Griselda Miner, MD as Consulting Physician (General Surgery) Pershing Proud, RN as Oncology Nurse Navigator Donnelly Angelica, RN as Oncology Nurse Navigator Malachy Mood, MD as Consulting Physician (Hematology) Pollyann Samples, NP as Nurse Practitioner (Nurse Practitioner) Lonie Peak, MD as Attending Physician (Radiation Oncology)  Clinic Day:  09/26/2023  Referring physician: Kristian Covey, MD  ASSESSMENT & PLAN:   Assessment & Plan: Cancer of central portion of left female breast (HCC) Stage IA, c(T1bN0M0), ER/PR+, HER2-, Grade II, pT1cN1aM0, stage IA  -Diagnosed in 08/2019, s/p B/l breast lumpectomy and SLNB with Dr. Carolynne Edouard on 10/06/19. Path showed: 1.8 cm IDC and DCIS, anterior margin focally positive; one positive lymph node with extranodal extension (1/3). Re-excision on 11/06/19 showed DCIS only, margins negative.  -Mammaprint showed high risk. She did not tolerate TC (allergic reaction to taxol) or Abraxane (symptomatic tachycardia, GI symptoms). -She completed adjuvant RT with Dr Basilio Cairo 01/25/20 - 03/06/20.  -She declined genetic testing -She took antiestrogen therapy over 2 months 05/2020-07/2020 and stopped due to poor toleration/side effects (exacerbated IBS from tamoxifen; bone concerns and anxiety from exemestane, declined to restart with SSRI). -3D diagnostic mammogram was done in 08/26/2023 and was negative for evidence of malignancy.  -Follow-up in 6 months, or sooner if needed   Plan: Labs reviewed  -CBC showing WBC 5.6; Hgb 14.1; Hct 42.0; Plt 287; Anc 3.5 -CMP - K 5.0; glucose 101; BUN 22; Creatinine 0.92; eGFR >60; Ca 9.6; LFTs normal.  . Reviewed recent mammogram done 08/26/2023 which was negative.  Labs and follow up in 6 months.   The patient understands the plans discussed today and is in agreement with them.  She knows  to contact our office if she develops concerns prior to her next appointment.  I provided 25 minutes of face-to-face time during this encounter and > 50% was spent counseling as documented under my assessment and plan.    Carlean Jews, NP  [facility] - A DEPT OF [facility] [address] [place] [address] Dept: [phone] Dept Fax: [phone]   No orders of the defined types were placed in this encounter.     CHIEF COMPLAINT:  CC: f/u left breast cancer  Current Treatment:  surveillance  INTERVAL HISTORY:  Shelly [name] is here today for repeat clinical assessment. Last saw Clayborn Heron, NP on 03/23/2023. She had bilateral diagnostic mammogram on 08/26/2023 which was negative for evidence of malignancy. Recommendation is to return to screening mammogram in 1 year.  She denies fevers or chills. She denies pain. Her appetite is good. Her weight has increased 6 pounds over last 6 months .  I have reviewed the past medical history, past surgical history, social history and family history with the patient and they are unchanged from previous note.  ALLERGIES:  is allergic to taxotere [docetaxel], abraxane [paclitaxel protein-bound part], crestor [rosuvastatin calcium], lipitor [atorvastatin calcium], lisinopril, nsaids, pravastatin, and sulfonamide derivatives.  MEDICATIONS:  Current Outpatient Medications  Medication Sig Dispense Refill   acetaminophen (TYLENOL) 500 MG tablet Take 500 mg by mouth every 6 (six) hours as needed for headache or mild pain.     calcium-vitamin D (OSCAL WITH D) 500-5 MG-MCG tablet Take 1 tablet by mouth daily.     clidinium-chlordiazePOXIDE (LIBRAX) 5-2.5 MG capsule TAKE 1 CAPSULE EVERY DAY AS NEEDED 90  capsule 0   denosumab (PROLIA) 60 MG/ML SOSY injection Inject 60 mg into the skin every 6 (six) months. Last inj was in October     loratadine (CLARITIN) 10 MG tablet Take 10 mg by mouth  daily.     OVER THE COUNTER MEDICATION Tums-as needed     No current facility-administered medications for this visit.    HISTORY OF PRESENT ILLNESS:   Oncology History Overview Note  Cancer Staging Cancer of central portion of left female breast [facility]) Staging form: Breast, AJCC 8th Edition - Clinical stage from 09/18/2019: Stage IA (cT1b, cN0, cM0, G2, ER+, PR+, HER2-) - Signed by Malachy Mood, MD on 09/18/2019 - Pathologic stage from 10/06/2019: Stage IA (pT1c, pN1a, cM0, G2, ER+, PR+, HER2-) - Signed by Malachy Mood, MD on 10/19/2019    Cancer of central portion of left female breast (HCC)  08/24/2019 Mammogram   Diagnostic mammogram 08/24/19  IMPRESSION: 1. Left breast mass measuring 0.8 x 0.4 x 0.5 cm at the 1 o'clock retroareolar region is suspicious and likely corresponds to the area of distortion seen on mammography.   2. Left breast 3 o'clock retroareolar dilated duct with internal debris versus solid mass.   3. Right breast retroareolar focal asymmetry without sonographic correlate.   08/28/2019 Initial Biopsy   Final Diagnosis 08/28/19 1. Breast, left, needle core biopsy, 1 o'clock/retroareolar - INVASIVE DUCTAL CARCINOMA, GRADE II. - PERINEURAL INVASION. - SEE MICROSCOPIC DESCRIPTION. 2. Breast, left, needle core biopsy, 3 o'clock retroareolar - DUCTAL PAPILLOMA. - SEE MICROSCOPIC DESCRIPTION.   08/28/2019 Receptors her2   1. PROGNOSTIC INDICATORS 08/28/19 Results: IMMUNOHISTOCHEMICAL AND MORPHOMETRIC ANALYSIS PERFORMED MANUALLY The tumor cells are NEGATIVE for Her2 (0). Estrogen Receptor: 100%, POSITIVE, STRONG STAINING INTENSITY Progesterone Receptor: 50%, POSITIVE, STRONG STAINING INTENSITY Proliferation Marker Ki67: 12%   09/01/2019 Pathology Results   Diagnosis 09/01/19 Breast, right, needle core biopsy, retroareolar - COMPLEX SCLEROSING LESION WITH A PAPILLARY SCLEROSING LESION AND USUAL DUCTAL HYPERPLASIA. SEE NOTE - NEGATIVE FOR CARCINOMA   09/18/2019  Initial Diagnosis   Cancer of central portion of left female breast (HCC)   09/18/2019 Cancer Staging   Staging form: Breast, AJCC 8th Edition - Clinical stage from 09/18/2019: Stage IA (cT1b, cN0, cM0, G2, ER+, PR+, HER2-) - Signed by Malachy Mood, MD on 09/18/2019   10/06/2019 Surgery   LEFT BREAST LUMPECTOMY  X2 WITH RADIOACTIVE SEED X2  AND SENTINEL LYMPH NODE BIOPSY and  RIGHT BREAST LUMPECTOMY WITH RADIOACTIVE SEED LOCALIZATION by Dr Carolynne Edouard  10/06/19    10/06/2019 Pathology Results   FINAL MICROSCOPIC DIAGNOSIS: 10/06/19   A. BREAST, RIGHT, LUMPECTOMY:  - Fibrocystic change with a small incidental intraductal papilloma and  usual ductal hyperplasia  - Biopsy site changes  - Negative for carcinoma   B. LYMPH NODE, LEFT #1, SENTINEL, BIOPSY:  - Metastatic carcinoma to a lymph node (1/1)  - Focus of metastatic carcinoma measures 2.5 mm and shows extranodal  extension   C. LYMPH NODE, LEFT #2, SENTINEL, BIOPSY:  - Lymph node, negative for carcinoma (0/1)   D. LYMPH NODE, LEFT #3, SENTINEL, BIOPSY:  - Lymph node, negative for carcinoma (0/1)   E. BREAST, LEFT, LUMPECTOMY:  - Invasive ductal carcinoma, grade 2, 1.8 cm  - Ductal carcinoma in situ, intermediate grade  - Invasive carcinoma focally involves the anterior margin  - DCIS is less than 1 mm from the superior margin  - Background breast parenchyma with fibrocystic change, usual ductal  hyperplasia and intraductal papilloma.  - Biopsy site  changes  - See oncology table    10/06/2019 Miscellaneous   Her Mammaprint showed High Risk Luminal Type B, risk of recurrence in the next 10 years is 29% in high risk disease. MPI -0.335    10/06/2019 Cancer Staging   Staging form: Breast, AJCC 8th Edition - Pathologic stage from 10/06/2019: Stage IA (pT1c, pN1a, cM0, G2, ER+, PR+, HER2-) - Signed by Malachy Mood, MD on 10/19/2019   11/06/2019 Surgery   EXCISION NIPPLE AND AREOLA  MARGIN LEFT BREAST by Dr Carolynne Edouard  11/06/19    11/06/2019  Pathology Results    FINAL MICROSCOPIC DIAGNOSIS: 11/06/19  A. BREAST, LEFT CENTRAL, RE-EXCISION:  -  Residual ductal carcinoma in situ, intermediate grade  -  Margins uninvolved by carcinoma (0.4 cm; superior margin)  -  Atypical intraductal papilloma  -  Usual ductal hyperplasia  -  Benign skin with seborrheic keratosis  -  Previous surgical site changes  -  No invasive carcinoma identified    12/06/2019 - 12/20/2019 Chemotherapy   Adjuvant chemo TC q3weeks for 4 cycles starting 12/06/19. With first TC infusion she had allergy reaction to docetaxel and Ledell Noss was not given on 12/06/19, we discontinued.   -I started her on weekly Abraxane on 12/20/19, but 2 days after infusion she started to feel tachycardic, hot/flushed, and lightheaded. A few days later she had dysuria, diarrhea and abdominal cramps and intermittent chest pain. She opted to d/c chemo and proceed with RT.    01/25/2020 - 03/06/2020 Radiation Therapy   Adjuvant Radiation with Dr Basilio Cairo 01/25/20-03/06/20   05/2020 - 07/2020 Anti-estrogen oral therapy   I started her on antiestrogen therapy with Tamoxifen in 03/2020. She took until 06/06/20 and 10mg  06/04/20- 06/2020 but stopped due to exacerbated IBS symptoms. She also tried 2 weeks of Exemestane in 07/2020 but stopped due to increased Anxiety.   06/06/2020 Survivorship   Care plan delivered by Santiago Glad, NP    12/04/2020 Imaging   CT chest  IMPRESSION: 1. Marked interval worsening of parenchymal disease now involving areas of the LEFT lower lobe, lingula, RIGHT upper lobe and with nodular opacity in the RIGHT lower lobe. Findings could be seen in the setting of COVID-19 infection. Pattern of disease also raises the question of organizing pneumonia. Pneumonitis, drug related changes are also considered. Pattern of disease would be atypical for neoplasm. Pulmonary consultation is suggested if not yet performed. Continued short interval surveillance is also suggested. 2. Post LEFT  breast lumpectomy and axillary dissection. 3. Calcified coronary artery disease in LEFT and RIGHT coronary circulation. 4. Stable LEFT adrenal nodule compatible with benign adenoma. 5. Aortic atherosclerosis.   Aortic Atherosclerosis (ICD10-I70.0).   08/26/2023 Mammogram   Bilateral diagnostic mammogram IMPRESSION: No evidence for malignancy within either breast.  RECOMMENDATION: Screening mammogram in one year.(Code:[id number])         REVIEW OF SYSTEMS:   Constitutional: Denies fevers, chills or abnormal weight loss Eyes: Denies blurriness of vision Ears, nose, mouth, throat, and face: Denies mucositis or sore throat Respiratory: Denies cough, dyspnea or wheezes Cardiovascular: Denies palpitation, chest discomfort or lower extremity swelling Gastrointestinal:  Denies nausea, heartburn or change in bowel habits Skin: Denies abnormal skin rashes Lymphatics: Denies new lymphadenopathy or easy bruising Neurological:Denies numbness, tingling or new weaknesses Behavioral/Psych: Mood is stable, no new changes  All other systems were reviewed with the patient and are negative.   VITALS:   Today's Vitals   09/23/23 1100 09/23/23 1114 09/23/23 1133  BP:  (!) 179/72 120/84  Pulse:  68   Resp:  17   Temp:  97.8 F (36.6 C)   TempSrc:  Oral   SpO2:  97%   Weight:  187 lb 11.2 oz (85.1 kg)   PainSc: 0-No pain     Body mass index is 31.23 kg/m.   Wt Readings from Last 3 Encounters:  09/23/23 187 lb 11.2 oz (85.1 kg)  08/23/23 181 lb (82.1 kg)  08/13/23 181 lb (82.1 kg)    Body mass index is 31.23 kg/m.  Performance status (ECOG): 0 - Asymptomatic  PHYSICAL EXAM:   GENERAL:alert, no distress and comfortable SKIN: skin color, texture, turgor are normal, no rashes or significant lesions EYES: normal, Conjunctiva are pink and non-injected, sclera clear OROPHARYNX:no exudate, no erythema and lips, buccal mucosa, and tongue normal  NECK: supple, thyroid normal size,  non-tender, without nodularity LYMPH:  no palpable lymphadenopathy in the cervical, axillary or inguinal LUNGS: clear to auscultation and percussion with normal breathing effort HEART: regular rate & rhythm and no murmurs and no lower extremity edema ABDOMEN:abdomen soft, non-tender and normal bowel sounds Musculoskeletal:no cyanosis of digits and no clubbing  NEURO: alert & oriented x 3 with fluent speech, no focal motor/sensory deficits BREAST: S/p left lumpectomy, incisions completely healed, nipple surgically absent. No palpable mass or nodularity in either breast or axilla that are appreciated today.     LABORATORY DATA:  I have reviewed the data as listed    Component Value Date/Time   NA 141 09/23/2023 1020   K 5.0 09/23/2023 1020   CL 106 09/23/2023 1020   CO2 31 09/23/2023 1020   GLUCOSE 101 (H) 09/23/2023 1020   BUN 22 09/23/2023 1020   CREATININE 0.92 09/23/2023 1020   CREATININE 0.92 08/14/2020 1025   CALCIUM 9.6 09/23/2023 1020   PROT 6.9 09/23/2023 1020   ALBUMIN 4.2 09/23/2023 1020   AST 15 09/23/2023 1020   ALT 14 09/23/2023 1020   ALKPHOS 63 09/23/2023 1020   BILITOT 0.4 09/23/2023 1020   GFRNONAA >60 09/23/2023 1020   GFRAA >60 02/12/2020 1230   GFRAA >60 01/03/2020 1241     Lab Results  Component Value Date   WBC 5.6 09/23/2023   NEUTROABS 3.5 09/23/2023   HGB 14.1 09/23/2023   HCT 42.0 09/23/2023   MCV 87.9 09/23/2023   PLT 287 09/23/2023      RADIOGRAPHIC STUDIES: No results found.

## 2023-09-22 NOTE — Assessment & Plan Note (Addendum)
Stage IA, c(T1bN0M0), ER/PR+, HER2-, Grade II, pT1cN1aM0, stage IA  -Diagnosed in 08/2019, s/p B/l breast lumpectomy and SLNB with Dr. Carolynne Edouard on 10/06/19. Path showed: 1.8 cm IDC and DCIS, anterior margin focally positive; one positive lymph node with extranodal extension (1/3). Re-excision on 11/06/19 showed DCIS only, margins negative.  -Mammaprint showed high risk. She did not tolerate TC (allergic reaction to taxol) or Abraxane (symptomatic tachycardia, GI symptoms). -She completed adjuvant RT with Dr Basilio Cairo 01/25/20 - 03/06/20.  -She declined genetic testing -She took antiestrogen therapy over 2 months 05/2020-07/2020 and stopped due to poor toleration/side effects (exacerbated IBS from tamoxifen; bone concerns and anxiety from exemestane, declined to restart with SSRI). -3D diagnostic mammogram was done in 08/26/2023 and was negative for evidence of malignancy.  -Follow-up in 6 months, or sooner if needed

## 2023-09-23 ENCOUNTER — Inpatient Hospital Stay (HOSPITAL_BASED_OUTPATIENT_CLINIC_OR_DEPARTMENT_OTHER): Payer: Medicare Other | Admitting: Nurse Practitioner

## 2023-09-23 ENCOUNTER — Inpatient Hospital Stay: Payer: Medicare Other | Attending: Hematology

## 2023-09-23 VITALS — BP 120/84 | HR 68 | Temp 97.8°F | Resp 17 | Wt 187.7 lb

## 2023-09-23 DIAGNOSIS — Z08 Encounter for follow-up examination after completed treatment for malignant neoplasm: Secondary | ICD-10-CM | POA: Insufficient documentation

## 2023-09-23 DIAGNOSIS — Z170 Estrogen receptor positive status [ER+]: Secondary | ICD-10-CM

## 2023-09-23 DIAGNOSIS — Z853 Personal history of malignant neoplasm of breast: Secondary | ICD-10-CM | POA: Insufficient documentation

## 2023-09-23 DIAGNOSIS — C50112 Malignant neoplasm of central portion of left female breast: Secondary | ICD-10-CM | POA: Diagnosis not present

## 2023-09-23 LAB — CBC WITH DIFFERENTIAL (CANCER CENTER ONLY)
Abs Immature Granulocytes: 0.01 10*3/uL (ref 0.00–0.07)
Basophils Absolute: 0 10*3/uL (ref 0.0–0.1)
Basophils Relative: 1 %
Eosinophils Absolute: 0.1 10*3/uL (ref 0.0–0.5)
Eosinophils Relative: 1 %
HCT: 42 % (ref 36.0–46.0)
Hemoglobin: 14.1 g/dL (ref 12.0–15.0)
Immature Granulocytes: 0 %
Lymphocytes Relative: 28 %
Lymphs Abs: 1.6 10*3/uL (ref 0.7–4.0)
MCH: 29.5 pg (ref 26.0–34.0)
MCHC: 33.6 g/dL (ref 30.0–36.0)
MCV: 87.9 fL (ref 80.0–100.0)
Monocytes Absolute: 0.5 10*3/uL (ref 0.1–1.0)
Monocytes Relative: 8 %
Neutro Abs: 3.5 10*3/uL (ref 1.7–7.7)
Neutrophils Relative %: 62 %
Platelet Count: 287 10*3/uL (ref 150–400)
RBC: 4.78 MIL/uL (ref 3.87–5.11)
RDW: 13.8 % (ref 11.5–15.5)
WBC Count: 5.6 10*3/uL (ref 4.0–10.5)
nRBC: 0 % (ref 0.0–0.2)

## 2023-09-23 LAB — CMP (CANCER CENTER ONLY)
ALT: 14 U/L (ref 0–44)
AST: 15 U/L (ref 15–41)
Albumin: 4.2 g/dL (ref 3.5–5.0)
Alkaline Phosphatase: 63 U/L (ref 38–126)
Anion gap: 4 — ABNORMAL LOW (ref 5–15)
BUN: 22 mg/dL (ref 8–23)
CO2: 31 mmol/L (ref 22–32)
Calcium: 9.6 mg/dL (ref 8.9–10.3)
Chloride: 106 mmol/L (ref 98–111)
Creatinine: 0.92 mg/dL (ref 0.44–1.00)
GFR, Estimated: >60 mL/min (ref >60)
Glucose, Bld: 101 mg/dL — ABNORMAL HIGH (ref 70–99)
Potassium: 5 mmol/L (ref 3.5–5.1)
Sodium: 141 mmol/L (ref 135–145)
Total Bilirubin: 0.4 mg/dL (ref <1.2)
Total Protein: 6.9 g/dL (ref 6.5–8.1)

## 2023-09-26 ENCOUNTER — Encounter: Payer: Self-pay | Admitting: Nurse Practitioner

## 2023-12-17 ENCOUNTER — Ambulatory Visit (INDEPENDENT_AMBULATORY_CARE_PROVIDER_SITE_OTHER): Payer: Medicare Other | Admitting: Physician Assistant

## 2023-12-17 ENCOUNTER — Encounter: Payer: Self-pay | Admitting: Physician Assistant

## 2023-12-17 ENCOUNTER — Ambulatory Visit: Payer: Self-pay | Admitting: Family Medicine

## 2023-12-17 VITALS — BP 150/88 | HR 77 | Temp 97.7°F | Ht 65.0 in | Wt 189.0 lb

## 2023-12-17 DIAGNOSIS — I10 Essential (primary) hypertension: Secondary | ICD-10-CM | POA: Diagnosis not present

## 2023-12-17 DIAGNOSIS — R300 Dysuria: Secondary | ICD-10-CM

## 2023-12-17 LAB — POCT URINALYSIS DIPSTICK
Bilirubin, UA: NEGATIVE
Blood, UA: NEGATIVE
Glucose, UA: NEGATIVE
Ketones, UA: NEGATIVE
Leukocytes, UA: NEGATIVE
Nitrite, UA: NEGATIVE
Protein, UA: NEGATIVE
Spec Grav, UA: 1.01 (ref 1.010–1.025)
Urobilinogen, UA: 0.2 U/dL
pH, UA: 6 (ref 5.0–8.0)

## 2023-12-17 MED ORDER — CEPHALEXIN 500 MG PO CAPS: 500.0000 mg | ORAL_CAPSULE | Freq: Two times a day (BID) | ORAL | 0 refills | Status: DC

## 2023-12-17 NOTE — Progress Notes (Signed)
Shelly [name] is a 78 y.o. female here for a recurrence of a previously resolved problem.  History of Present Illness:   Chief Complaint  Patient presents with   Dysuria    Pt c/o burning with urination, frequency x 2 days.    HPI  Dysuria: Pt complains of dysuria, bladder fullness/pressure, urinary frequency, urgency, and hesitancy starting 2 days ago.  She also endorses lower abdominal soreness and tenderness when she palpates her abdomen.  She has hx of interstitial cystitis and attributes her current sx to her "eating the wrong foods" -- has been eating spicy and acidic foods.  Reports eating pepperoni pizza and drinking wine Tends to drink water when she feels she is having a flare up.  Since she increased her water intake today her sx have improved.  Denies any unusual vaginal discharge.   Elevated BP: Pt attributes her elevated BP to white coat syndrome.  Checks her BP at home and reports systolic readings ranging from 130-140 with occasional readings in the 150s.  States her BP tends to be elevated when first waking up. Has intolerances to many antihypertensives.  Reports feeling lethargic with antihypertensives, especially during the daytime.   Past Medical History:  Diagnosis Date   Allergy    Arthritis    wrist, shoulder    Cancer (HCC) 08/2019   left breast IDC   Diverticulitis    Endometrial polyp    Family history of breast cancer    GERD (gastroesophageal reflux disease)    occasional - diet controlled   Hx of adenomatous colonic polyps    Hyperlipidemia    IBS (irritable bowel syndrome)    Missed abortion    x 1 - resolved - no surgery   Osteoporosis    SVD (spontaneous vaginal delivery)    x 1   Transient vision disturbance 2019   was evaluated by ED, no findings   Vitamin D deficiency      Social History   Tobacco Use   Smoking status: Former    Current packs/day: 0.00    Average packs/day: 0.1 packs/day for 20.0 years (2.0 ttl pk-yrs)     Types: Cigarettes    Start date: 05/15/1987    Quit date: 05/15/2007    Years since quitting: 16.6   Smokeless tobacco: Never  Vaping Use   Vaping status: Never Used  Substance Use Topics   Alcohol use: Yes    Alcohol/week: 14.0 standard drinks of alcohol    Types: 14 Glasses of wine per week    Comment: 2 glasses wine every evening   Drug use: No    Past Surgical History:  Procedure Laterality Date   BREAST EXCISIONAL BIOPSY Right 08/2019   Benign, lesion    BREAST LUMPECTOMY Left 09/2019   BREAST LUMPECTOMY WITH RADIOACTIVE SEED AND SENTINEL LYMPH NODE BIOPSY Left 10/06/2019   Procedure: LEFT BREAST LUMPECTOMY  X2 WITH RADIOACTIVE SEED X2  AND SENTINEL LYMPH NODE BIOPSY;  Surgeon: Griselda Miner, MD;  Location: [facility];  Service: General;  Laterality: Left;   BREAST LUMPECTOMY WITH RADIOACTIVE SEED LOCALIZATION Right 10/06/2019   Procedure: RIGHT BREAST LUMPECTOMY WITH RADIOACTIVE SEED LOCALIZATION;  Surgeon: Griselda Miner, MD;  Location: [facility];  Service: General;  Laterality: Right;   BRONCHIAL BIOPSY  12/27/2020   Procedure: BRONCHIAL BIOPSIES;  Surgeon: Josephine Igo, DO;  Location: [facility];  Service: Pulmonary;;   BRONCHIAL BRUSHINGS  12/27/2020   Procedure: BRONCHIAL BRUSHINGS;  Surgeon: Josephine Igo, DO;  Location: [facility];  Service: Pulmonary;;   BRONCHIAL NEEDLE ASPIRATION BIOPSY  12/27/2020   Procedure: BRONCHIAL NEEDLE ASPIRATION BIOPSIES;  Surgeon: Josephine Igo, DO;  Location: [facility];  Service: Pulmonary;;   BRONCHIAL WASHINGS  12/27/2020   Procedure: BRONCHIAL WASHINGS;  Surgeon: Josephine Igo, DO;  Location: [facility];  Service: Pulmonary;;   COLONOSCOPY     DIAGNOSTIC LAPAROSCOPY     cysts   DILATATION & CURETTAGE/HYSTEROSCOPY WITH TRUECLEAR N/A 10/31/2013   Procedure: DILATATION & CURETTAGE/HYSTEROSCOPY WITH TRUCLEAR;  Surgeon: Zelphia Cairo, MD;  Location: [facility];  Service: Gynecology;   Laterality: N/A;   DILATION AND CURETTAGE OF UTERUS     MASS EXCISION Left 11/06/2019   Procedure: EXCISION NIPPLE AND AREOLA  MARGIN LEFT BREAST;  Surgeon: Griselda Miner, MD;  Location: [facility];  Service: General;  Laterality: Left;   TONSILLECTOMY  1952   VIDEO BRONCHOSCOPY WITH ENDOBRONCHIAL NAVIGATION Bilateral 12/27/2020   Procedure: VIDEO BRONCHOSCOPY WITH ENDOBRONCHIAL NAVIGATION;  Surgeon: Josephine Igo, DO;  Location: [facility];  Service: Pulmonary;  Laterality: Bilateral;   VIDEO BRONCHOSCOPY WITH ENDOBRONCHIAL ULTRASOUND Bilateral 12/27/2020   Procedure: VIDEO BRONCHOSCOPY WITH ENDOBRONCHIAL ULTRASOUND;  Surgeon: Josephine Igo, DO;  Location: [facility];  Service: Pulmonary;  Laterality: Bilateral;   WISDOM TOOTH EXTRACTION      Family History  Problem Relation Age of Onset   Hyperlipidemia Mother    Hypertension Mother    Diabetes Mother        type ll   Leukemia Mother 51   Colon polyps Mother    Lung cancer Father    Hyperlipidemia Father    Breast cancer Cousin 30       maternal first couin   Heart disease Other    Hypertension Other    Cancer Other    Depression Brother    Breast cancer Maternal Aunt 67   Uterine cancer Paternal Grandmother    Breast cancer Maternal Aunt 50   Lung cancer Maternal Grandfather    Cancer Maternal Grandfather        gastric cancer?   Cancer Maternal Uncle        unknown   Brain cancer Paternal Uncle    Throat cancer Paternal Uncle    Colon cancer Neg Hx    Esophageal cancer Neg Hx    Stomach cancer Neg Hx    Rectal cancer Neg Hx     Allergies  Allergen Reactions   Taxotere [Docetaxel] Swelling   Abraxane [Paclitaxel Protein-Bound Part] Other (See Comments)    3 days after receiving she had one episode of very high BP and HR. This happened again 2 days later during the night.    Crestor [Rosuvastatin Calcium] Itching    GI upset   Lipitor [Atorvastatin Calcium] Itching   Lisinopril Cough    Nsaids Other (See Comments)    GI upset   Pravastatin Itching    GI upset   Sulfonamide Derivatives Rash    Hives, itiching    Current Medications:   Current Outpatient Medications:    acetaminophen (TYLENOL) 500 MG tablet, Take 500 mg by mouth every 6 (six) hours as needed for headache or mild pain., Disp: , Rfl:    calcium-vitamin D (OSCAL WITH D) 500-5 MG-MCG tablet, Take 1 tablet by mouth daily., Disp: , Rfl:    cephALEXin (KEFLEX) 500 MG capsule, Take 1 capsule (500 mg total) by mouth 2 (two) times daily., Disp:  14 capsule, Rfl: 0   clidinium-chlordiazePOXIDE (LIBRAX) 5-2.5 MG capsule, TAKE 1 CAPSULE EVERY DAY AS NEEDED, Disp: 90 capsule, Rfl: 0   denosumab (PROLIA) 60 MG/ML SOSY injection, Inject 60 mg into the skin every 6 (six) months. Last inj was in October, Disp: , Rfl:    loratadine (CLARITIN) 10 MG tablet, Take 10 mg by mouth daily., Disp: , Rfl:    OVER THE COUNTER MEDICATION, Tums-as needed, Disp: , Rfl:    Review of Systems:   Negative unless otherwise specified per HPI.  Vitals:   Vitals:   12/17/23 1139 12/17/23 1204  BP: (!) 160/90 (!) 150/88  Pulse: 77   Temp: 97.7 F (36.5 C)   TempSrc: Temporal   SpO2: 97%   Weight: 189 lb (85.7 kg)   Height: 5\' 5"  (1.651 m)      Body mass index is 31.45 kg/m.  Physical Exam:   Physical Exam Vitals and nursing note reviewed.  Constitutional:      General: She is not in acute distress.    Appearance: She is well-developed. She is not ill-appearing or toxic-appearing.  Cardiovascular:     Rate and Rhythm: Normal rate and regular rhythm.     Pulses: Normal pulses.     Heart sounds: Normal heart sounds, S1 normal and S2 normal.  Pulmonary:     Effort: Pulmonary effort is normal.     Breath sounds: Normal breath sounds.  Abdominal:     Tenderness: There is abdominal tenderness in the suprapubic area.  Skin:    General: Skin is warm and dry.  Neurological:     Mental Status: She is alert.     GCS: GCS eye  subscore is 4. GCS verbal subscore is 5. GCS motor subscore is 6.  Psychiatric:        Speech: Speech normal.        Behavior: Behavior normal. Behavior is cooperative.    Results for orders placed or performed in visit on 12/17/23  POCT urinalysis dipstick  Result Value Ref Range   Color, UA yellow    Clarity, UA clear    Glucose, UA Negative Negative   Bilirubin, UA Negative    Ketones, UA Negative    Spec Grav, UA 1.010 1.010 - 1.025   Blood, UA Negative    pH, UA 6.0 5.0 - 8.0   Protein, UA Negative Negative   Urobilinogen, UA 0.2 0.2 or 1.0 E.U./dL   Nitrite, UA Negative    Leukocytes, UA Negative Negative   Appearance     Odor      Assessment and Plan:   1. Dysuria (Primary) No red flags on discussion, patient is not in any obvious distress during our visit. Discussed that current urine is normal, however urine culture will be sent off to confirm  I did however provide pocket rx for oral keflex should symptoms worsen while we await results Discussed over the counter supportive care options, with recommendations to push fluids  Worsening precautions advised - POCT urinalysis dipstick - Urine Culture  2. Essential hypertension Above goal today No evidence of end-organ damage on my exam Recommend patient monitor home blood pressure at least a few times weekly If home monitoring shows consistent elevation, or any symptom(s) develop, recommend reach out to [place] for further advice on next steps  I, Isabelle Course, acting as a Neurosurgeon for Jarold Motto, [place]., have documented all relevant documentation on the behalf of Jarold Motto, [place], as directed by  Jarold Motto,  PA while in the presence of Jarold Motto, [place].  I, Jarold Motto, [place], have reviewed all documentation for this visit. The documentation on 12/17/23 for the exam, diagnosis, procedures, and orders are all accurate and complete.  Jarold Motto, PA-C

## 2023-12-17 NOTE — Telephone Encounter (Signed)
  Chief Complaint: urinary symptoms Symptoms: lower back pain, bladder fullness sensation, urinary frequency, burning with urination Frequency: x 2 days Pertinent Negatives: Patient denies nausea, vomiting, fever, blood in urine. Disposition: [] ED /[] Urgent Care (no appt availability in office) / [x] Appointment(In office/virtual)/ []  [place]/ [] Home Care/ [] Refused Recommended Disposition /[] [facility]/ []  Follow-up with PCP Additional Notes: Patient states she has a history of cystitis, UTIs. She states the symptoms feel similar to cystitis. She states she has had chronic problems with lower back pain but has felt it also has flared up recently and is unsure if it is related. Patient agreeable to appointment this morning with available provider/location.  Copied from CRM [id number]. Topic: Clinical - Red Word Triage >> Dec 17, 2023  8:01 AM Adele Barthel wrote: Red Word that prompted transfer to Nurse Triage: Pain in lower abdomen for a couple of days. Thinks she may have a urinary tract infection. Been drinking fluids this morning, with no improvement. Reason for Disposition  Side (flank) or lower back pain present  Answer Assessment - Initial Assessment Questions 1. SYMPTOM: "What's the main symptom you're concerned about?" (e.g., frequency, incontinence)     Bladder feels full, painful urination, and urinary frequency.  2. ONSET: "When did the symptoms  start?"     X2 days. Worsened since last night/today.  3. PAIN: "Is there any pain?" If Yes, ask: "How bad is it?" (Scale: 1-10; mild, moderate, severe)     Moderate.  4. CAUSE: "What do you think is causing the symptoms?"     Patient states she had this before and she had received an antibiotic for it. She states she has issues with her bladder in the past but it has been a while since the last time.  5. OTHER SYMPTOMS: "Do you have any other symptoms?" (e.g., blood in urine, fever, flank pain, pain with  urination)     Discomfort in lower abdomen (in my bladder).  Protocols used: Urinary Symptoms-A-AH, Urination Pain - Female-A-AH

## 2023-12-17 NOTE — Patient Instructions (Addendum)
It was great to see you!  If you develop worsening symptom(s), please start the cephalexin antibiotic(s)   General instructions Make sure you: Pee until your bladder is empty. Do not hold pee for a long time. Empty your bladder after sex. Wipe from front to back after pooping if you are a female. Use each tissue one time when you wipe. Drink enough fluid to keep your pee pale yellow. Keep all follow-up visits as told by your doctor. This is important. Contact a doctor if: You do not get better after 1-2 days. Your symptoms go away and then come back. Get help right away if: You have very bad back pain. You have very bad pain in your lower belly. You have a fever. You are sick to your stomach (nauseous). You are throwing up.   Your blood pressure is elevated in our office today.  I recommend that you monitor this at home.  Your goal blood pressure should be around < 130/80, unless you are over 69 years old, your goal may be closer to 140-150/90. Please note if you have been given other goals from a cardiologist or other healthcare provider, please defer to their recommendations.  When preparing to take your blood pressure: Plan ahead. Don't smoke, drink caffeine or exercise within 30 minutes before taking your blood pressure. Empty your bladder. Don't take the measurement over clothes. Remove the clothing over the arm that will be used to measure blood pressure. You can use either arm unless otherwise told by a healthcare provider. Usually there is not a big difference between readings on them. Be still. Allow at least five minutes of quiet rest before measurements. Don't talk or use the phone. Sit correctly. Sit with your back straight and supported (on a dining chair, rather than a sofa). Your feet should be flat on the floor. Do not cross your legs. Support your arm on a flat surface. The middle of the cuff should be placed on the upper arm at heart level.  Measure at the same  time of the day. Take multiple readings and record the results. Each time you measure, take two readings one minute apart. Record the results and bring in to your next office visit.  In order to know how well the medication is working, I would like you to take your readings 1-2 hours after taking your blood pressure medication if possible. Take your blood pressure measurements and record 2-3 days per week.  If you get a high blood pressure reading: A single high reading is not an immediate cause for alarm. If you get a reading that is higher than normal, take your blood pressure a second time. Write down the results of both measurements. Check with your health care professional to see if there's a health concern or whether there may be problems with your monitor. If your blood pressure readings are suddenly higher than 180/120 mm Hg, wait at least one minute and test again. If your readings are still very high, contact your health care professional immediately. You could be having a hypertensive crisis. Call 911 if your blood pressure is higher than 180/120 mm Hg and if you are having new signs or symptoms that may include: Chest pain Shortness of breath Back pain Numbness Weakness Change in vision Difficulty speaking Confusion Dizziness Vomiting  Take care,  Jarold Motto PA-C

## 2023-12-18 LAB — URINE CULTURE
MICRO NUMBER:: 16025830
Result:: NO GROWTH
SPECIMEN QUALITY:: ADEQUATE

## 2023-12-19 ENCOUNTER — Encounter: Payer: Self-pay | Admitting: Physician Assistant

## 2024-01-15 DIAGNOSIS — I519 Heart disease, unspecified: Secondary | ICD-10-CM | POA: Insufficient documentation

## 2024-01-17 ENCOUNTER — Other Ambulatory Visit: Payer: Self-pay

## 2024-01-17 ENCOUNTER — Inpatient Hospital Stay (HOSPITAL_COMMUNITY)
Admission: EM | Admit: 2024-01-17 | Discharge: 2024-01-19 | DRG: 322 | Disposition: A | Discharge status: Home / Self Care | Attending: Cardiovascular Disease | Admitting: Cardiovascular Disease

## 2024-01-17 ENCOUNTER — Emergency Department (HOSPITAL_COMMUNITY)

## 2024-01-17 ENCOUNTER — Ambulatory Visit: Payer: Medicare Other | Admitting: Family Medicine

## 2024-01-17 ENCOUNTER — Encounter (HOSPITAL_COMMUNITY): Payer: Self-pay | Admitting: Emergency Medicine

## 2024-01-17 DIAGNOSIS — Z8049 Family history of malignant neoplasm of other genital organs: Secondary | ICD-10-CM

## 2024-01-17 DIAGNOSIS — Z8249 Family history of ischemic heart disease and other diseases of the circulatory system: Secondary | ICD-10-CM | POA: Diagnosis not present

## 2024-01-17 DIAGNOSIS — Z9189 Other specified personal risk factors, not elsewhere classified: Secondary | ICD-10-CM

## 2024-01-17 DIAGNOSIS — Z806 Family history of leukemia: Secondary | ICD-10-CM

## 2024-01-17 DIAGNOSIS — K589 Irritable bowel syndrome without diarrhea: Secondary | ICD-10-CM | POA: Diagnosis present

## 2024-01-17 DIAGNOSIS — R7989 Other specified abnormal findings of blood chemistry: Principal | ICD-10-CM

## 2024-01-17 DIAGNOSIS — Z818 Family history of other mental and behavioral disorders: Secondary | ICD-10-CM

## 2024-01-17 DIAGNOSIS — E785 Hyperlipidemia, unspecified: Secondary | ICD-10-CM | POA: Diagnosis not present

## 2024-01-17 DIAGNOSIS — Z955 Presence of coronary angioplasty implant and graft: Secondary | ICD-10-CM

## 2024-01-17 DIAGNOSIS — Z808 Family history of malignant neoplasm of other organs or systems: Secondary | ICD-10-CM

## 2024-01-17 DIAGNOSIS — I25110 Atherosclerotic heart disease of native coronary artery with unstable angina pectoris: Secondary | ICD-10-CM | POA: Diagnosis not present

## 2024-01-17 DIAGNOSIS — E7800 Pure hypercholesterolemia, unspecified: Secondary | ICD-10-CM | POA: Diagnosis not present

## 2024-01-17 DIAGNOSIS — G4489 Other headache syndrome: Secondary | ICD-10-CM | POA: Diagnosis not present

## 2024-01-17 DIAGNOSIS — R079 Chest pain, unspecified: Secondary | ICD-10-CM | POA: Diagnosis not present

## 2024-01-17 DIAGNOSIS — Z803 Family history of malignant neoplasm of breast: Secondary | ICD-10-CM

## 2024-01-17 DIAGNOSIS — Z801 Family history of malignant neoplasm of trachea, bronchus and lung: Secondary | ICD-10-CM | POA: Diagnosis not present

## 2024-01-17 DIAGNOSIS — I249 Acute ischemic heart disease, unspecified: Secondary | ICD-10-CM | POA: Diagnosis present

## 2024-01-17 DIAGNOSIS — E119 Type 2 diabetes mellitus without complications: Secondary | ICD-10-CM | POA: Diagnosis present

## 2024-01-17 DIAGNOSIS — R42 Dizziness and giddiness: Secondary | ICD-10-CM | POA: Diagnosis not present

## 2024-01-17 DIAGNOSIS — E118 Type 2 diabetes mellitus with unspecified complications: Secondary | ICD-10-CM | POA: Insufficient documentation

## 2024-01-17 DIAGNOSIS — Z853 Personal history of malignant neoplasm of breast: Secondary | ICD-10-CM | POA: Diagnosis not present

## 2024-01-17 DIAGNOSIS — I200 Unstable angina: Secondary | ICD-10-CM | POA: Diagnosis not present

## 2024-01-17 DIAGNOSIS — E782 Mixed hyperlipidemia: Secondary | ICD-10-CM | POA: Diagnosis not present

## 2024-01-17 DIAGNOSIS — I2510 Atherosclerotic heart disease of native coronary artery without angina pectoris: Secondary | ICD-10-CM | POA: Diagnosis not present

## 2024-01-17 DIAGNOSIS — K219 Gastro-esophageal reflux disease without esophagitis: Secondary | ICD-10-CM | POA: Diagnosis present

## 2024-01-17 DIAGNOSIS — I6782 Cerebral ischemia: Secondary | ICD-10-CM | POA: Diagnosis not present

## 2024-01-17 DIAGNOSIS — Z87891 Personal history of nicotine dependence: Secondary | ICD-10-CM | POA: Diagnosis not present

## 2024-01-17 DIAGNOSIS — Z833 Family history of diabetes mellitus: Secondary | ICD-10-CM | POA: Diagnosis not present

## 2024-01-17 DIAGNOSIS — I10 Essential (primary) hypertension: Secondary | ICD-10-CM

## 2024-01-17 DIAGNOSIS — R778 Other specified abnormalities of plasma proteins: Secondary | ICD-10-CM | POA: Diagnosis not present

## 2024-01-17 DIAGNOSIS — R519 Headache, unspecified: Secondary | ICD-10-CM | POA: Diagnosis not present

## 2024-01-17 LAB — CBC
HCT: 43.7 % (ref 36.0–46.0)
HCT: 41 % (ref 36.0–46.0)
Hemoglobin: 14.3 g/dL (ref 12.0–15.0)
Hemoglobin: 13.7 g/dL (ref 12.0–15.0)
MCH: 29.1 pg (ref 26.0–34.0)
MCH: 29.5 pg (ref 26.0–34.0)
MCHC: 32.7 g/dL (ref 30.0–36.0)
MCHC: 33.4 g/dL (ref 30.0–36.0)
MCV: 89 fL (ref 80.0–100.0)
MCV: 88.2 fL (ref 80.0–100.0)
Platelets: 269 10*3/uL (ref 150–400)
Platelets: 281 K/uL (ref 150–400)
RBC: 4.91 MIL/uL (ref 3.87–5.11)
RBC: 4.65 MIL/uL (ref 3.87–5.11)
RDW: 13.8 % (ref 11.5–15.5)
RDW: 14 % (ref 11.5–15.5)
WBC: 5.9 10*3/uL (ref 4.0–10.5)
WBC: 8.3 K/uL (ref 4.0–10.5)
nRBC: 0 % (ref 0.0–0.2)
nRBC: 0 % (ref 0.0–0.2)

## 2024-01-17 LAB — BASIC METABOLIC PANEL
Anion gap: 12 (ref 5–15)
BUN: 15 mg/dL (ref 8–23)
CO2: 23 mmol/L (ref 22–32)
Calcium: 9.2 mg/dL (ref 8.9–10.3)
Chloride: 105 mmol/L (ref 98–111)
Creatinine, Ser: 0.84 mg/dL (ref 0.44–1.00)
GFR, Estimated: >60 mL/min (ref >60)
Glucose, Bld: 117 mg/dL — ABNORMAL HIGH (ref 70–99)
Potassium: 3.6 mmol/L (ref 3.5–5.1)
Sodium: 140 mmol/L (ref 135–145)

## 2024-01-17 LAB — HEPARIN LEVEL (UNFRACTIONATED)
Heparin Unfractionated: <0.1 [IU]/mL — ABNORMAL LOW (ref 0.30–0.70)
Heparin Unfractionated: 0.23 [IU]/mL — ABNORMAL LOW (ref 0.30–0.70)

## 2024-01-17 LAB — TROPONIN I (HIGH SENSITIVITY)
Troponin I (High Sensitivity): 30 ng/L — ABNORMAL HIGH (ref <18)
Troponin I (High Sensitivity): 72 ng/L — ABNORMAL HIGH (ref <18)
Troponin I (High Sensitivity): 60 ng/L — ABNORMAL HIGH (ref <18)

## 2024-01-17 LAB — CBG MONITORING, ED: Glucose-Capillary: 115 mg/dL — ABNORMAL HIGH (ref 70–99)

## 2024-01-17 LAB — GLUCOSE, CAPILLARY: Glucose-Capillary: 111 mg/dL — ABNORMAL HIGH (ref 70–99)

## 2024-01-17 MED ORDER — INSULIN ASPART 100 UNIT/ML IJ SOLN
0.0000 [IU] | Freq: Three times a day (TID) | INTRAMUSCULAR | Status: DC
  Administered 2024-01-18: 2 [IU] via SUBCUTANEOUS
  Administered 2024-01-19: 3 [IU] via SUBCUTANEOUS

## 2024-01-17 MED ORDER — METOPROLOL TARTRATE 5 MG/5ML IV SOLN
5.0000 mg | Freq: Once | INTRAVENOUS | Status: AC
  Administered 2024-01-17: 5 mg via INTRAVENOUS
  Filled 2024-01-17: qty 5

## 2024-01-17 MED ORDER — NITROGLYCERIN 0.4 MG SL SUBL: 0.4000 mg | SUBLINGUAL_TABLET | SUBLINGUAL | Status: DC | PRN

## 2024-01-17 MED ORDER — ACETAMINOPHEN 325 MG PO TABS
650.0000 mg | ORAL_TABLET | ORAL | Status: DC | PRN
  Administered 2024-01-18 – 2024-01-19 (×2): 650 mg via ORAL
  Filled 2024-01-17 (×2): qty 2

## 2024-01-17 MED ORDER — ASPIRIN 81 MG PO CHEW
81.0000 mg | CHEWABLE_TABLET | ORAL | Status: AC
  Administered 2024-01-18: 81 mg via ORAL
  Filled 2024-01-17: qty 1

## 2024-01-17 MED ORDER — HEPARIN (PORCINE) 25000 UT/250ML-% IV SOLN
1250.0000 [IU]/h | INTRAVENOUS | Status: DC
  Administered 2024-01-17: 900 [IU]/h via INTRAVENOUS
  Administered 2024-01-18: 1250 [IU]/h via INTRAVENOUS
  Filled 2024-01-17 (×2): qty 250

## 2024-01-17 MED ORDER — ASPIRIN 81 MG PO CHEW: 324.0000 mg | CHEWABLE_TABLET | ORAL | Status: AC

## 2024-01-17 MED ORDER — SODIUM CHLORIDE 0.9 % WEIGHT BASED INFUSION
1.0000 mL/kg/h | INTRAVENOUS | Status: DC
  Administered 2024-01-18 (×2): 1 mL/kg/h via INTRAVENOUS

## 2024-01-17 MED ORDER — ASPIRIN 300 MG RE SUPP: 300.0000 mg | RECTAL | Status: AC

## 2024-01-17 MED ORDER — HEPARIN BOLUS VIA INFUSION
4000.0000 [IU] | Freq: Once | INTRAVENOUS | Status: AC
  Administered 2024-01-17: 4000 [IU] via INTRAVENOUS
  Filled 2024-01-17: qty 4000

## 2024-01-17 MED ORDER — ASPIRIN 81 MG PO TBEC
81.0000 mg | DELAYED_RELEASE_TABLET | Freq: Every day | ORAL | Status: DC
  Administered 2024-01-17 – 2024-01-19 (×3): 81 mg via ORAL
  Filled 2024-01-17 (×3): qty 1

## 2024-01-17 MED ORDER — PANTOPRAZOLE SODIUM 40 MG PO TBEC
40.0000 mg | DELAYED_RELEASE_TABLET | Freq: Every day | ORAL | Status: DC
  Administered 2024-01-17 – 2024-01-19 (×3): 40 mg via ORAL
  Filled 2024-01-17 (×3): qty 1

## 2024-01-17 MED ORDER — MELATONIN 3 MG PO TABS
3.0000 mg | ORAL_TABLET | Freq: Every day | ORAL | Status: DC
  Administered 2024-01-17 – 2024-01-18 (×2): 3 mg via ORAL
  Filled 2024-01-17 (×2): qty 1

## 2024-01-17 MED ORDER — ASPIRIN 81 MG PO TBEC: 81.0000 mg | DELAYED_RELEASE_TABLET | Freq: Every day | ORAL | Status: DC

## 2024-01-17 MED ORDER — ONDANSETRON HCL 4 MG/2ML IJ SOLN: 4.0000 mg | Freq: Four times a day (QID) | INTRAMUSCULAR | Status: DC | PRN

## 2024-01-17 MED ORDER — SODIUM CHLORIDE 0.9 % WEIGHT BASED INFUSION
3.0000 mL/kg/h | INTRAVENOUS | Status: AC
  Administered 2024-01-18: 3 mL/kg/h via INTRAVENOUS

## 2024-01-17 NOTE — Progress Notes (Signed)
 PHARMACY - ANTICOAGULATION CONSULT NOTE  Pharmacy Consult for heparin Indication: chest pain/ACS  Allergies  Allergen Reactions   Taxotere [Docetaxel] Swelling   Abraxane [Paclitaxel Protein-Bound Part] Other (See Comments)    3 days after receiving she had one episode of very high BP and HR. This happened again 2 days later during the night.    Crestor [Rosuvastatin Calcium] Itching    GI upset   Lipitor [Atorvastatin Calcium] Itching   Lisinopril Cough   Nsaids Other (See Comments)    GI upset   Pravastatin Itching    GI upset   Sulfonamide Derivatives Rash    Hives, itiching    Patient Measurements: Height: 5\' 5"  (165.1 cm) Weight: 82.6 kg (182 lb) IBW/kg (Calculated) : 57 Heparin Dosing Weight: 75 Kg  Vital Signs: Temp: 97.6 F (36.4 C) (03/03 0856) Temp Source: Oral (03/03 0856) BP: 123/98 (03/03 1030) Pulse Rate: 59 (03/03 1030)  Labs: Recent Labs    01/17/24 0843 01/17/24 1045  HGB 14.3  --   HCT 43.7  --   PLT 269  --   CREATININE 0.84  --   TROPONINIHS 30* 72*    Estimated Creatinine Clearance: 57.6 mL/min (by C-G formula based on SCr of 0.84 mg/dL).   Medical History: Past Medical History:  Diagnosis Date   Allergy    Arthritis    wrist, shoulder    Cancer (HCC) 08/2019   left breast IDC   Diverticulitis    Endometrial polyp    Family history of breast cancer    GERD (gastroesophageal reflux disease)    occasional - diet controlled   Hx of adenomatous colonic polyps    Hyperlipidemia    IBS (irritable bowel syndrome)    Missed abortion    x 1 - resolved - no surgery   Osteoporosis    SVD (spontaneous vaginal delivery)    x 1   Transient vision disturbance 2019   was evaluated by ED, no findings   Vitamin D deficiency     Assessment: 17 yoF presenting with jaw pain found to have slightly elevated troponin. A prior CT chest on 04/22/22 showed Left and right coronary artery calcifications. No oral anticoagulation reported PTA. CBC  WNL. Pharmacy consutled to manage heparin infusion.   Goal of Therapy:  Heparin level 0.3-0.7 units/ml Monitor platelets by anticoagulation protocol: Yes   Plan:  Give 4000 units bolus x 1 Start heparin infusion at 900 units/hr Check anti-Xa level in 8 hours and daily while on heparin Continue to monitor H&H and platelets  Ruben Im, PharmD Clinical Pharmacist 01/17/2024 2:46 PM Please check AMION for all [facility] Pharmacy numbers

## 2024-01-17 NOTE — Progress Notes (Signed)
 PHARMACY - ANTICOAGULATION CONSULT NOTE  Pharmacy Consult for heparin Indication: chest pain/ACS  Allergies  Allergen Reactions   Taxotere [Docetaxel] Swelling   Abraxane [Paclitaxel Protein-Bound Part] Other (See Comments)    3 days after receiving she had one episode of very high BP and HR. This happened again 2 days later during the night.    Crestor [Rosuvastatin Calcium] Itching    GI upset   Lipitor [Atorvastatin Calcium] Itching   Lisinopril Cough   Nsaids Other (See Comments)    GI upset   Pravastatin Itching    GI upset   Sulfonamide Derivatives Rash    Hives, itiching    Patient Measurements: Height: 5\' 5"  (165.1 cm) Weight: 84 kg (185 lb 3 oz) IBW/kg (Calculated) : 57 Heparin Dosing Weight: 75 Kg  Vital Signs: Temp: 98.9 F (37.2 C) (03/03 2050) Temp Source: Oral (03/03 2050) BP: 151/63 (03/03 2050) Pulse Rate: 79 (03/03 2050)  Labs: Recent Labs    01/17/24 0843 01/17/24 1045 01/17/24 1500 01/17/24 1630 01/17/24 2324  HGB 14.3  --   --   --  13.7  HCT 43.7  --   --   --  41.0  PLT 269  --   --   --  281  HEPARINUNFRC  --   --   --  <0.10* 0.23*  CREATININE 0.84  --   --   --   --   TROPONINIHS 30* 72* 60*  --   --     Estimated Creatinine Clearance: 58.1 mL/min (by C-G formula based on SCr of 0.84 mg/dL).  Assessment: 66 yoF presenting with jaw pain found to have slightly elevated troponin. A prior CT chest on 04/22/22 showed Left and right coronary artery calcifications. No oral anticoagulation reported PTA. CBC WNL. Pharmacy consutled to manage heparin infusion.   Heparin level 0.23 on 900 units/hr (subtherapeutic, drawn 6h after infusion started). Per RN, no issues with running continuously or signs/symptoms of bleeding. CBC stable  Goal of Therapy:  Heparin level 0.3-0.7 units/ml Monitor platelets by anticoagulation protocol: Yes   Plan:  Increase heparin infusion to 1100 units/hr Check anti-Xa level in 8 hours and daily while on  heparin Continue to monitor H&H and platelets  Arabella Merles, PharmD. Clinical Pharmacist 01/17/2024 11:48 PM

## 2024-01-17 NOTE — ED Provider Notes (Signed)
 [place] EMERGENCY DEPARTMENT AT [facility] Provider Note   CSN: [id number] Arrival date & time: 01/17/24  4098     History  Chief Complaint  Shelly [name] presents with   Hypertension    Shelly Shelly [name] is a 78 y.o. female.  Shelly Shelly [name] is a 79 yo female with pmhx significant for htn, diverticulitis, IBS, gerd, and breast cancer.  Shelly Shelly [name] has had multiple medication intolerances to her bp meds.  Her latest medication was lisinopril.  Shelly Shelly [name] developed a dry cough at night with that and could not sleep, so Shelly Shelly [name] took herself off of it.  Shelly Shelly [name] did have an appt with her pcp today to go on something else, but Shelly Shelly [name] developed a severe headache and was worried Shelly Shelly [name] was having a stroke.  Shelly Shelly [name] said Shelly headache is gone now and Shelly Shelly [name] feels fine.  Shelly Shelly [name] denied any numbness or weakness.       Home Medications Prior to Admission medications   Medication Sig Start Date End Date Taking? Authorizing Provider  acetaminophen (TYLENOL) 500 MG tablet Take 500 mg by mouth every 6 (six) hours as needed for headache or mild pain.   Yes [provider]  calcium-vitamin D (OSCAL WITH D) 500-5 MG-MCG tablet Take 1 tablet by mouth daily.   Yes [provider]  clidinium-chlordiazePOXIDE (LIBRAX) 5-2.5 MG capsule TAKE 1 CAPSULE EVERY DAY AS NEEDED Shelly [name] taking differently: Take 1 capsule by mouth daily as needed (bowel issue). 03/10/23  Yes Worthy Rancher B, FNP  denosumab (PROLIA) 60 MG/ML SOSY injection Inject 60 mg into Shelly skin every 6 (six) months. Last inj was in October   Yes [provider]  loratadine (CLARITIN) 10 MG tablet Take 10 mg by mouth daily.   Yes [provider]  omeprazole (PRILOSEC) 40 MG capsule Take 40 mg by mouth daily.  01/25/12  [provider]  pravastatin (PRAVACHOL) 40 MG tablet Take 1 tablet (40 mg total) by mouth every evening. 09/18/11 01/25/12  Burchette, Elberta Fortis, MD  prochlorperazine (COMPAZINE) 10 MG tablet Take 1 tablet (10 mg total) by mouth every 6  (six) hours as needed (Nausea or vomiting). 11/24/19 12/12/19  Malachy Mood, MD      Allergies    Taxotere [docetaxel], Abraxane [paclitaxel protein-bound part], Crestor [rosuvastatin calcium], Lipitor [atorvastatin calcium], Lisinopril, Nsaids, Pravastatin, and Sulfonamide derivatives    Review of Systems   Review of Systems  Neurological:  Positive for headaches.  All other systems reviewed and are negative.   Physical Exam Updated Vital Signs BP (!) 157/61   Pulse 71   Temp 97.6 F (36.4 C) (Oral)   Resp 17   Ht 5\' 5"  (1.651 m)   Wt 82.6 kg   SpO2 99%   BMI 30.29 kg/m  Physical Exam Vitals and nursing note reviewed.  Constitutional:      Appearance: Normal appearance.  HENT:     Head: Normocephalic and atraumatic.     Right Ear: External ear normal.     Left Ear: External ear normal.     Nose: Nose normal.     Mouth/Throat:     Mouth: Mucous membranes are moist.     Pharynx: Oropharynx is clear.  Eyes:     Extraocular Movements: Extraocular movements intact.     Conjunctiva/sclera: Conjunctivae normal.     Pupils: Pupils are equal, round, and reactive to light.  Cardiovascular:     Rate and Rhythm: Normal rate and regular rhythm.     Pulses: Normal pulses.  Heart sounds: Normal heart sounds.  Pulmonary:     Effort: Pulmonary effort is normal.     Breath sounds: Normal breath sounds.  Abdominal:     General: Abdomen is flat. Bowel sounds are normal.     Palpations: Abdomen is soft.  Musculoskeletal:        General: Normal range of motion.     Cervical back: Normal range of motion and neck supple.  Skin:    General: Skin is warm.     Capillary Refill: Capillary refill takes less than 2 seconds.  Neurological:     General: No focal deficit present.     Mental Status: Shelly Shelly [name] is alert and oriented to person, place, and time.  Psychiatric:        Mood and Affect: Mood normal.        Behavior: Behavior normal.     ED Results / Procedures / Treatments    Labs (all labs ordered are listed, but only abnormal results are displayed) Labs Reviewed  BASIC METABOLIC PANEL - Abnormal; Notable for Shelly following components:      Result Value   Glucose, Bld 117 (*)    All other components within normal limits  TROPONIN I (HIGH SENSITIVITY) - Abnormal; Notable for Shelly following components:   Troponin I (High Sensitivity) 30 (*)    All other components within normal limits  TROPONIN I (HIGH SENSITIVITY) - Abnormal; Notable for Shelly following components:   Troponin I (High Sensitivity) 72 (*)    All other components within normal limits  CBC  HEPARIN LEVEL (UNFRACTIONATED)  CBC  TROPONIN I (HIGH SENSITIVITY)    EKG EKG Interpretation Date/Time:  Monday January 17 2024 08:56:08 EST Ventricular Rate:  79 PR Interval:  144 QRS Duration:  95 QT Interval:  366 QTC Calculation: 420 R Axis:   35  Text Interpretation: Sinus rhythm No significant change since last tracing Confirmed by Jacalyn Lefevre [phone]) on 01/17/2024 11:04:25 AM  Radiology CT Head Wo Contrast Result Date: 01/17/2024 CLINICAL DATA:  Headache EXAM: CT HEAD WITHOUT CONTRAST TECHNIQUE: Contiguous axial images were obtained from Shelly base of Shelly skull through Shelly vertex without intravenous contrast. RADIATION DOSE REDUCTION: Shelly exam was performed according to Shelly departmental dose-optimization program which includes automated exposure control, adjustment of the mA and/or kV according to Shelly [name] size and/or use of iterative reconstruction technique. COMPARISON:  CT head 11/17/2018 FINDINGS: Brain: No acute intracranial hemorrhage. No CT evidence of acute infarct. Similar hypoattenuation in Shelly white matter particularly within Shelly left corona radiata likely reflecting chronic microvascular ischemic changes. No edema, mass effect, or midline shift. Shelly basilar cisterns are patent. Ventricles: Shelly ventricles are normal. Vascular: No hyperdense vessel or unexpected calcification. Skull: No acute  or aggressive finding. Orbits: Orbits are symmetric. Sinuses: Mucosal thickening in Shelly partially visualized left maxillary sinus. Other: Mastoid air cells are clear. IMPRESSION: 1. No acute intracranial process. 2. Similar mild chronic microvascular ischemic changes. Electronically Signed   By: Emily Filbert M.D.   On: 01/17/2024 12:10   DG Chest Port 1 View Result Date: 01/17/2024 CLINICAL DATA:  Hypertension. EXAM: PORTABLE CHEST 1 VIEW COMPARISON:  12/27/2020 FINDINGS: Shelly heart size and mediastinal contours are within normal limits. Both lungs are clear. Shelly visualized skeletal structures are unremarkable. IMPRESSION: No active disease. Electronically Signed   By: Danae Orleans M.D.   On: 01/17/2024 11:03    Procedures Procedures    Medications Ordered in ED Medications  heparin ADULT infusion 100 units/mL (25000  units/228mL) (has no administration in time range)  heparin bolus via infusion 4,000 Units (has no administration in time range)  aspirin EC tablet 81 mg (has no administration in time range)  metoprolol tartrate (LOPRESSOR) injection 5 mg (5 mg Intravenous Given 01/17/24 0935)    ED Course/ Medical Decision Making/ A&P                                 Medical Decision Making Amount and/or Complexity of Data Reviewed Labs: ordered. Radiology: ordered.  Risk Prescription drug management. Decision regarding hospitalization.   Shelly Shelly [name] presents to Shelly ED for concern of htn/headache, Shelly involves an extensive number of treatment options, and is a complaint that carries with it a high risk of complications and morbidity.  Shelly differential diagnosis includes ich, migraine, electrolyte abn, htn   Co morbidities that complicate Shelly Shelly [name] evaluation  htn, diverticulitis, IBS, gerd, and breast cancer.   Additional history obtained:  Additional history obtained from epic chart review External records from outside source obtained and reviewed including EMS  report/husband   Lab Tests:  I Ordered, and personally interpreted labs.  Shelly pertinent results include:  cbc nl, bmp nl, trop 30; repeat trop 72   Imaging Studies ordered:  I ordered imaging studies including cxr and ct head  I independently visualized and interpreted imaging which showed  CXR: No active disease.  CT head: No acute intracranial process.  2. Similar mild chronic microvascular ischemic changes.   I agree with Shelly radiologist interpretation   Cardiac Monitoring:  Shelly Shelly [name] was maintained on a cardiac monitor.  I personally viewed and interpreted Shelly cardiac monitored which showed an underlying rhythm of: nsr   Medicines ordered and prescription drug management:  I ordered medication including lopressor  for htn  Reevaluation of Shelly Shelly [name] after these medicines showed that Shelly Shelly [name] improved I have reviewed Shelly patients home medicines and have made adjustments as needed   Test Considered:  ct   Critical Interventions:  Cards consult   Consultations Obtained:  I requested consultation with cardiology,  and discussed lab and imaging findings as well as pertinent plan - they will admit and plan for cath tomorrow   Problem List / ED Course:  NSTEMI:  Shelly Shelly [name] cp free now.  Shelly Shelly [name] will be admitted to cards.  Heparin started. HTN:  Shelly Shelly [name] given lopressor and has tolerated Shelly well.   Reevaluation:  After Shelly interventions noted above, I reevaluated Shelly Shelly [name] and found that they have :improved   Social Determinants of Health:  Lives at home   Dispostion:  After consideration of Shelly diagnostic results and Shelly patients response to treatment, I feel that Shelly patent would benefit from admission.          Final Clinical Impression(s) / ED Diagnoses Final diagnoses:  Elevated troponin  Hypertension, unspecified type    Rx / DC Orders ED Discharge Orders     None         Jacalyn Lefevre, MD 01/17/24 [phone]

## 2024-01-17 NOTE — H&P (Addendum)
 Cardiology Admission History and Physical   Patient ID: Shelly [name] MRN: [id number]; DOB: [birth year]   Admission date: 01/17/2024  PCP:  Kristian Covey, MD   [facility] Providers Cardiologist:  Nicki Guadalajara, MD  (new)      Chief Complaint:  Jaw Pain  Patient Profile:   Shelly [name] is a 78 y.o. female with a past medical history of hypertension, coronary calcifications on non contrast CT on 2023,hyperlipidemia intolerant to statin, Diabetes, GERD, TMJ, and breast cancer who is being seen 01/17/2024 for the evaluation of Jaw pain.  History of Present Illness:   Shelly [name] has no prior cardiac history or stroke and presented to emergency department after having neck stiffness and jaw pain this morning. Denies any chest pain or shortness of breath. In the emergency department the BMP showed a glucose of 117 and Creatinine of 0.84 and was otherwise unremarkable. The CBC was unremarkable with a Hemoglobin of 14.3 and platelets at 269. Chest x-ray did not show any signs of active disease. Troponin's 30-> 72. Initial blood pressure was elevated at 185/79 and a pulse of 79, was given IV lopressor 5 mg. The  pulse dropped to 59 with a BP of 123/98. A prior CT chest on 04/22/22 showed Left and right coronary artery calcifications. A1C on 07/2020 was 6.6. LDL on 05/2011 was 215.9  On physical exam Shelly [name] reported having 8/10 burning Jaw pain and headache yesterday for 30 minutes while walking around. The jaw pain improved with rest. This morning she had Jaw pain for 15 minutes while walking to bed after eating breakfast. The pain was not associated with muscle use like past TMJ was and was more severe. Has not had any jaw pain since the episode this morning. Reported taking lisinopril for hypertension but stopping after having a cough. Home blood pressures were mostly 120's/60's. Denies chest pain, shortness of breath, nausea, vomiting, or diaphoresis. Reported smoking 1/5  pack per day for about 40 years and quit about 20 years ago.     Past Medical History:  Diagnosis Date   Allergy    Arthritis    wrist, shoulder    Cancer (HCC) 08/2019   left breast IDC   Diverticulitis    Endometrial polyp    Family history of breast cancer    GERD (gastroesophageal reflux disease)    occasional - diet controlled   Hx of adenomatous colonic polyps    Hyperlipidemia    IBS (irritable bowel syndrome)    Missed abortion    x 1 - resolved - no surgery   Osteoporosis    SVD (spontaneous vaginal delivery)    x 1   Transient vision disturbance 2019   was evaluated by ED, no findings   Vitamin D deficiency     Past Surgical History:  Procedure Laterality Date   BREAST EXCISIONAL BIOPSY Right 08/2019   Benign, lesion    BREAST LUMPECTOMY Left 09/2019   BREAST LUMPECTOMY WITH RADIOACTIVE SEED AND SENTINEL LYMPH NODE BIOPSY Left 10/06/2019   Procedure: LEFT BREAST LUMPECTOMY  X2 WITH RADIOACTIVE SEED X2  AND SENTINEL LYMPH NODE BIOPSY;  Surgeon: Griselda Miner, MD;  Location: [facility];  Service: General;  Laterality: Left;   BREAST LUMPECTOMY WITH RADIOACTIVE SEED LOCALIZATION Right 10/06/2019   Procedure: RIGHT BREAST LUMPECTOMY WITH RADIOACTIVE SEED LOCALIZATION;  Surgeon: Griselda Miner, MD;  Location: [facility];  Service: General;  Laterality: Right;   BRONCHIAL BIOPSY  12/27/2020   Procedure: BRONCHIAL BIOPSIES;  Surgeon: Josephine Igo, DO;  Location: [facility];  Service: Pulmonary;;   BRONCHIAL BRUSHINGS  12/27/2020   Procedure: BRONCHIAL BRUSHINGS;  Surgeon: Josephine Igo, DO;  Location: [facility];  Service: Pulmonary;;   BRONCHIAL NEEDLE ASPIRATION BIOPSY  12/27/2020   Procedure: BRONCHIAL NEEDLE ASPIRATION BIOPSIES;  Surgeon: Josephine Igo, DO;  Location: [facility];  Service: Pulmonary;;   BRONCHIAL WASHINGS  12/27/2020   Procedure: BRONCHIAL WASHINGS;  Surgeon: Josephine Igo, DO;  Location: [facility];   Service: Pulmonary;;   COLONOSCOPY     DIAGNOSTIC LAPAROSCOPY     cysts   DILATATION & CURETTAGE/HYSTEROSCOPY WITH TRUECLEAR N/A 10/31/2013   Procedure: DILATATION & CURETTAGE/HYSTEROSCOPY WITH TRUCLEAR;  Surgeon: Zelphia Cairo, MD;  Location: [facility];  Service: Gynecology;  Laterality: N/A;   DILATION AND CURETTAGE OF UTERUS     MASS EXCISION Left 11/06/2019   Procedure: EXCISION NIPPLE AND AREOLA  MARGIN LEFT BREAST;  Surgeon: Griselda Miner, MD;  Location: [facility];  Service: General;  Laterality: Left;   TONSILLECTOMY  1952   VIDEO BRONCHOSCOPY WITH ENDOBRONCHIAL NAVIGATION Bilateral 12/27/2020   Procedure: VIDEO BRONCHOSCOPY WITH ENDOBRONCHIAL NAVIGATION;  Surgeon: Josephine Igo, DO;  Location: [facility];  Service: Pulmonary;  Laterality: Bilateral;   VIDEO BRONCHOSCOPY WITH ENDOBRONCHIAL ULTRASOUND Bilateral 12/27/2020   Procedure: VIDEO BRONCHOSCOPY WITH ENDOBRONCHIAL ULTRASOUND;  Surgeon: Josephine Igo, DO;  Location: [facility];  Service: Pulmonary;  Laterality: Bilateral;   WISDOM TOOTH EXTRACTION       Medications Prior to Admission: Prior to Admission medications   Medication Sig Start Date End Date Taking? Authorizing Provider  acetaminophen (TYLENOL) 500 MG tablet Take 500 mg by mouth every 6 (six) hours as needed for headache or mild pain.   Yes [provider]  calcium-vitamin D (OSCAL WITH D) 500-5 MG-MCG tablet Take 1 tablet by mouth daily.   Yes [provider]  clidinium-chlordiazePOXIDE (LIBRAX) 5-2.5 MG capsule TAKE 1 CAPSULE EVERY DAY AS NEEDED Patient taking differently: Take 1 capsule by mouth daily as needed (bowel issue). 03/10/23  Yes Worthy Rancher B, FNP  denosumab (PROLIA) 60 MG/ML SOSY injection Inject 60 mg into the skin every 6 (six) months. Last inj was in October   Yes [provider]  loratadine (CLARITIN) 10 MG tablet Take 10 mg by mouth daily.   Yes [provider]  omeprazole (PRILOSEC) 40  MG capsule Take 40 mg by mouth daily.  01/25/12  [provider]  pravastatin (PRAVACHOL) 40 MG tablet Take 1 tablet (40 mg total) by mouth every evening. 09/18/11 01/25/12  Burchette, Elberta Fortis, MD  prochlorperazine (COMPAZINE) 10 MG tablet Take 1 tablet (10 mg total) by mouth every 6 (six) hours as needed (Nausea or vomiting). 11/24/19 12/12/19  Malachy Mood, MD     Allergies:    Allergies  Allergen Reactions   Taxotere [Docetaxel] Swelling   Abraxane [Paclitaxel Protein-Bound Part] Other (See Comments)    3 days after receiving she had one episode of very high BP and HR. This happened again 2 days later during the night.    Crestor [Rosuvastatin Calcium] Itching    GI upset   Lipitor [Atorvastatin Calcium] Itching   Lisinopril Cough   Nsaids Other (See Comments)    GI upset   Pravastatin Itching    GI upset   Sulfonamide Derivatives Rash    Hives, itiching    Social History:   Social History  Socioeconomic History   Marital status: Married    Spouse name: Not on file   Number of children: 1   Years of education: Not on file   Highest education level: Not on file  Occupational History   Not on file  Tobacco Use   Smoking status: Former    Current packs/day: 0.00    Average packs/day: 0.1 packs/day for 20.0 years (2.0 ttl pk-yrs)    Types: Cigarettes    Start date: 05/15/1987    Quit date: 05/15/2007    Years since quitting: 16.6   Smokeless tobacco: Never  Vaping Use   Vaping status: Never Used  Substance and Sexual Activity   Alcohol use: Yes    Alcohol/week: 14.0 standard drinks of alcohol    Types: 14 Glasses of wine per week    Comment: 2 glasses wine every evening   Drug use: No   Sexual activity: Yes    Birth control/protection: Post-menopausal  Other Topics Concern   Not on file  Social History Narrative   Lives with husband in 2 story house   Has one daughter, with 3 grandchildren, local, supportive   Enjoys gardening/walking outside with husband    Occasionally does home exercise calisthenics but not recently.   Well-balanced diet overall   Social Drivers of Corporate investment banker Strain: Low Risk  (08/23/2023)   Overall Financial Resource Strain (CARDIA)    Difficulty of Paying Living Expenses: Not hard at all  Food Insecurity: No Food Insecurity (01/17/2024)   Hunger Vital Sign    Worried About Running Out of Food in the Last Year: Never true    Ran Out of Food in the Last Year: Never true  Transportation Needs: No Transportation Needs (01/17/2024)   PRAPARE - Administrator, Civil Service (Medical): No    Lack of Transportation (Non-Medical): No  Physical Activity: Inactive (08/23/2023)   Exercise Vital Sign    Days of Exercise per Week: 0 days    Minutes of Exercise per Session: 0 min  Stress: No Stress Concern Present (08/23/2023)   [employer] of Occupational Health - Occupational Stress Questionnaire    Feeling of Stress : Not at all  Social Connections: Moderately Isolated (01/17/2024)   Social Connection and Isolation Panel [NHANES]    Frequency of Communication with Friends and Family: More than three times a week    Frequency of Social Gatherings with Friends and Family: Twice a week    Attends Religious Services: Never    Database administrator or Organizations: No    Attends Banker Meetings: Never    Marital Status: Married  Catering manager Violence: Not At Risk (01/17/2024)   Humiliation, Afraid, Rape, and Kick questionnaire    Fear of Current or Ex-Partner: No    Emotionally Abused: No    Physically Abused: No    Sexually Abused: No    Family History:   The patient's family history includes Brain cancer in her paternal uncle; Breast cancer (age of onset: 1) in her cousin; Breast cancer (age of onset: 40) in her maternal aunt and maternal aunt; Cancer in her maternal grandfather, maternal uncle, and another family member; Colon polyps in her mother; Depression in her brother;  Diabetes in her mother; Heart disease in an other family member; Hyperlipidemia in her father and mother; Hypertension in her mother and another family member; Leukemia (age of onset: 63) in her mother; Lung cancer in her father and maternal grandfather;  Throat cancer in her paternal uncle; Uterine cancer in her paternal grandmother. There is no history of Colon cancer, Esophageal cancer, Stomach cancer, or Rectal cancer.    ROS:  Please see the history of present illness.  All other ROS reviewed and negative.     Physical Exam/Data:   Vitals:   01/17/24 0851 01/17/24 0856 01/17/24 1030 01/17/24 1300  BP:  (!) 185/79 (!) 123/98 (!) 157/61  Pulse:  79 (!) 59 71  Resp:  18 18 17   Temp:  97.6 F (36.4 C)    TempSrc:  Oral    SpO2:  100% 100% 99%  Weight: 82.6 kg     Height: 5\' 5"  (1.651 m)      No intake or output data in the 24 hours ending 01/17/24 1631    01/17/2024    8:51 AM 12/17/2023   11:39 AM 09/23/2023   11:14 AM  Last 3 Weights  Weight (lbs) 182 lb 189 lb 187 lb 11.2 oz  Weight (kg) 82.555 kg 85.73 kg 85.14 kg     Body mass index is 30.29 kg/m.  General:  Well nourished, well developed, in no acute distress HEENT: normal Neck: no JVD Vascular: No carotid bruits; Distal pulses 2+ bilaterally   Cardiac:  normal S1, S2; RRR; no murmur  Lungs:  clear to auscultation bilaterally, no wheezing, rhonchi or rales  Abd: soft, nontender, no hepatomegaly  Ext: no edema Musculoskeletal:  No deformities Skin: warm and dry  Neuro:  CNs 2-12 intact, no focal abnormalities noted Psych:  Normal affect    EKG:  The ECG that was done 01/17/24 was personally reviewed and demonstrates normal sinus, heart rate 79.  Relevant CV Studies: Echo pending  Laboratory Data:  High Sensitivity Troponin:   Recent Labs  Lab 01/17/24 0843 01/17/24 1045  TROPONINIHS 30* 72*      Chemistry Recent Labs  Lab 01/17/24 0843  NA 140  K 3.6  CL 105  CO2 23  GLUCOSE 117*  BUN 15   CREATININE 0.84  CALCIUM 9.2  GFRNONAA >60  ANIONGAP 12    No results for input(s): "PROT", "ALBUMIN", "AST", "ALT", "ALKPHOS", "BILITOT" in the last 168 hours. Lipids No results for input(s): "CHOL", "TRIG", "HDL", "LABVLDL", "LDLCALC", "CHOLHDL" in the last 168 hours. Hematology Recent Labs  Lab 01/17/24 0843  WBC 5.9  RBC 4.91  HGB 14.3  HCT 43.7  MCV 89.0  MCH 29.1  MCHC 32.7  RDW 13.8  PLT 269   Thyroid No results for input(s): "TSH", "FREET4" in the last 168 hours. BNPNo results for input(s): "BNP", "PROBNP" in the last 168 hours.  DDimer No results for input(s): "DDIMER" in the last 168 hours.   Radiology/Studies:  CT Head Wo Contrast Result Date: 01/17/2024 CLINICAL DATA:  Headache EXAM: CT HEAD WITHOUT CONTRAST TECHNIQUE: Contiguous axial images were obtained from the base of the skull through the vertex without intravenous contrast. RADIATION DOSE REDUCTION: This exam was performed according to the departmental dose-optimization program which includes automated exposure control, adjustment of the mA and/or kV according to patient size and/or use of iterative reconstruction technique. COMPARISON:  CT head 11/17/2018 FINDINGS: Brain: No acute intracranial hemorrhage. No CT evidence of acute infarct. Similar hypoattenuation in the white matter particularly within the left corona radiata likely reflecting chronic microvascular ischemic changes. No edema, mass effect, or midline shift. The basilar cisterns are patent. Ventricles: The ventricles are normal. Vascular: No hyperdense vessel or unexpected calcification. Skull: No acute or aggressive  finding. Orbits: Orbits are symmetric. Sinuses: Mucosal thickening in the partially visualized left maxillary sinus. Other: Mastoid air cells are clear. IMPRESSION: 1. No acute intracranial process. 2. Similar mild chronic microvascular ischemic changes. Electronically Signed   By: Emily Filbert M.D.   On: 01/17/2024 12:10   DG Chest Port 1  View Result Date: 01/17/2024 CLINICAL DATA:  Hypertension. EXAM: PORTABLE CHEST 1 VIEW COMPARISON:  12/27/2020 FINDINGS: The heart size and mediastinal contours are within normal limits. Both lungs are clear. The visualized skeletal structures are unremarkable. IMPRESSION: No active disease. Electronically Signed   By: Danae Orleans M.D.   On: 01/17/2024 11:03     Assessment and Plan:  Naudia Crosley is a 78 y.o. female with a past medical history of hypertension, coronary calcifications on non contrast CT on 2023,hyperlipidemia intolerant to statin, Diabetes, GERD, TMJ, and breast cancer who is being seen 01/17/2024 for the evaluation of Jaw pain.  Jaw pain and elevated troponin's High sensitivity troponin's 30-> 72,  - had 8/10 burning jaw pain for 30 minutes yesterday and 15 minutes today. Both episodes were worse with exertion and improves with rest.Currently does not have jaw pain. I suspect that the jaw pain that is relieved with rest is her anginal equivalent. With family history of heart attacks, hypertension, hyperlipidemia prior tobacco use, diabetes, increased age, and prior coronary calcifications on chest CT on 04/2022 she is at high risk of ACS. - Due to high number of risk factors and Jaw pain in an elderly female diabetic plan for Left heart catheterization. Creatinine is 0.8 - Ordered echo to assess heart function. - Ordered Stat Hstn to trend troponin's. - Continue IV heparin and start aspirin 81 mg due to suspected ACS - PRN nitroglycerine.  Hypertension - had a cough with taking lisinopril - not on home medications at this time will follow trends.  Hyperlipidemia LDL Goal <70 - will not start high intensity statin due to reported previous itching with crestor and lipitor.  - prior LDL was 215.9 on 05/2011. - Order lipid panel and LPA. - will likely need referral to lipid clinic for [id number]  Prior tobacco use - smoked for at least 40 years. Quit about 20 years  ago  Diabetes - A1C 6.6 in 2021. Glucose was elevated when presenting to the ER. - order A1C and SSI  GERD - Patient has a history of GI upset with NSAIDs. No true allergy to aspirin known. - Start Protonix 40mg  daily for GI protection.  Suspect that this is ACS and that jaw pain is her anginal equivalent. Plan for [facility] tomorrow she is NPO after midnight. Tentatively scheduled for the cath lab at 1:30 pm tomorrow.    Informed Consent   Shared Decision Making/Informed Consent The risks [stroke (1 in 1000), death (1 in 1000), kidney failure [usually temporary] (1 in 500), bleeding (1 in 200), allergic reaction [possibly serious] (1 in 200)], benefits (diagnostic support and management of coronary artery disease) and alternatives of a cardiac catheterization were discussed in detail with Shelly [name] and she is willing to proceed.      Risk Assessment/Risk Scores:    TIMI Risk Score for Unstable Angina or Non-ST Elevation MI:   The patient's TIMI risk score is 3, which indicates a 13% risk of all cause mortality, new or recurrent myocardial infarction or need for urgent revascularization in the next 14 days.      Code Status: Full Code  Severity of Illness: The appropriate patient status  for this patient is OBSERVATION. Observation status is judged to be reasonable and necessary in order to provide the required intensity of service to ensure the patient's safety. The patient's presenting symptoms, physical exam findings, and initial radiographic and laboratory data in the context of their medical condition is felt to place them at decreased risk for further clinical deterioration. Furthermore, it is anticipated that the patient will be medically stable for discharge from the hospital within 2 midnights of admission.    For questions or updates, please contact [facility] Please consult [email or web address] for contact info under     Signed, Arabella Merles, PA-C  01/17/2024 4:31 PM    Patient seen and examined. Agree with assessment and plan.  Shelly [name] is a very pleasant 78 year old female who has a history of hypertension, hyperlipidemia, diabetes mellitus, GERD, TMJ, and history of prior breast cancer.  She has had issues with TMJ discomfort.  She has a prior 30 to 40-year history of tobacco use smoking approximately 5 to 7 cigarettes a day.  She quit approximately 20 years ago.  She has intolerance to numerous medications and had recently been placed on lisinopril which she discontinued secondary to cough. She has GERD.  Remotely, she was started on statin therapy for hyperlipidemia.  Prior LDL in 2012 was 215.  Apparently she stopped statins.  It does not appear that she has had any follow-up lipid analysis since that time.  Over the past several days, she began to notice exertionally precipitated jaw pain holding which was different from her prior TMJ discomfort.  She experienced that yesterday in the cold weather and it recurred again today.  Due to recurrent symptomatology, she presented to the emergency room for evaluation.  Presently, she is pain-free.  Blood pressure in the ER was notable for diastolic hypertension.  A remote CT of her chest and June 2023 demonstrated left and right coronary artery calcifications.  On exam HEENT is unremarkable.  There is no JVD.  Lungs are clear.  There was no chest wall tenderness to palpation.  Rhythm was regular with no S3 gallop.  Abdomen was soft nontender.  There was no significant edema.  Her ECG shows sinus rhythm at 79 bpm without significant ST-T abnormalities.  Initial troponin is 30 with subsequent troponin 72.  Labs revealed glucose mildly elevated at 117.  Renal function normal.  CBC stable. I had a lengthy discussion with her today.  With her significant history of marked hyperlipidemia, remote tobacco use, and recent exertionally precipitated jaw pain which may be an anginal equivalent, I discussed evaluation for CAD with  initial coronary CTA versus definitive cardiac catheterization.  After much discussion, she prefers a definitive evaluation.  I reviewed the  risks and benefits of a cardiac catheterization including, but not limited to, death, stroke, MI, kidney damage and bleeding were discussed with the patient who indicates understanding and agrees to proceed.  Will keep n.p.o. overnight and plan cardiac catheterization.  With GERD history, and pantoprazole.  Will check fasting lipid panel and LP(a) in a.m.   Lennette Bihari, MD, [facility] 01/17/2024 4:34 PM

## 2024-01-17 NOTE — ED Notes (Signed)
 Pt ambulated to restroom. Instructed to obtain urine sample. Specimen cup provided.

## 2024-01-17 NOTE — ED Notes (Signed)
 RN called CCMD for cardiac monitoring.

## 2024-01-17 NOTE — ED Triage Notes (Signed)
 Pt BIB by EMS for HTN. Reports HA since yesterday afternoon with some relief. Hx of HTN. Prescribed lisinopril but noncompliant with medications d/t onset of persistent cough. Pt woke up this AM with neck stiffness into jaw but reports hx of TMJ. Denies CP or SHOB.  EMS VS: BP 200/90 HR 67 CBG 231

## 2024-01-17 NOTE — ED Notes (Signed)
 Transported to CT

## 2024-01-18 ENCOUNTER — Inpatient Hospital Stay (HOSPITAL_COMMUNITY)

## 2024-01-18 ENCOUNTER — Encounter (HOSPITAL_COMMUNITY): Payer: Self-pay | Admitting: Internal Medicine

## 2024-01-18 ENCOUNTER — Encounter (HOSPITAL_COMMUNITY)
Admission: EM | Disposition: A | Discharge status: Home / Self Care | Payer: Self-pay | Source: Home / Self Care | Attending: Cardiovascular Disease

## 2024-01-18 DIAGNOSIS — I200 Unstable angina: Secondary | ICD-10-CM | POA: Diagnosis not present

## 2024-01-18 DIAGNOSIS — Z9189 Other specified personal risk factors, not elsewhere classified: Secondary | ICD-10-CM | POA: Diagnosis not present

## 2024-01-18 DIAGNOSIS — E785 Hyperlipidemia, unspecified: Secondary | ICD-10-CM | POA: Diagnosis not present

## 2024-01-18 DIAGNOSIS — I2510 Atherosclerotic heart disease of native coronary artery without angina pectoris: Secondary | ICD-10-CM | POA: Diagnosis not present

## 2024-01-18 DIAGNOSIS — R079 Chest pain, unspecified: Secondary | ICD-10-CM

## 2024-01-18 DIAGNOSIS — R7989 Other specified abnormal findings of blood chemistry: Secondary | ICD-10-CM | POA: Diagnosis not present

## 2024-01-18 HISTORY — PX: CORONARY ANGIOGRAPHY: CATH118303

## 2024-01-18 HISTORY — PX: CORONARY IMAGING/OCT: CATH118326

## 2024-01-18 HISTORY — PX: CORONARY STENT INTERVENTION: CATH118234

## 2024-01-18 LAB — BASIC METABOLIC PANEL
Anion gap: 9 (ref 5–15)
BUN: 21 mg/dL (ref 8–23)
CO2: 22 mmol/L (ref 22–32)
Calcium: 8.7 mg/dL — ABNORMAL LOW (ref 8.9–10.3)
Chloride: 108 mmol/L (ref 98–111)
Creatinine, Ser: 1.11 mg/dL — ABNORMAL HIGH (ref 0.44–1.00)
GFR, Estimated: 51 mL/min — ABNORMAL LOW (ref >60)
Glucose, Bld: 127 mg/dL — ABNORMAL HIGH (ref 70–99)
Potassium: 3.9 mmol/L (ref 3.5–5.1)
Sodium: 139 mmol/L (ref 135–145)

## 2024-01-18 LAB — ECHOCARDIOGRAM COMPLETE
AR max vel: 2.06 cm2
AV Area VTI: 2.25 cm2
AV Area mean vel: 1.8 cm2
AV Mean grad: 3 mmHg
AV Peak grad: 4.8 mmHg
Ao pk vel: 1.09 m/s
Area-P 1/2: 3.95 cm2
Calc EF: 60.8 %
Height: 65 in
MV VTI: 2.6 cm2
S' Lateral: 2.5 cm
Single Plane A2C EF: 63.9 %
Single Plane A4C EF: 63.1 %
Weight: 2966.4 [oz_av]

## 2024-01-18 LAB — CBC
HCT: 42.5 % (ref 36.0–46.0)
Hemoglobin: 14.1 g/dL (ref 12.0–15.0)
MCH: 29.1 pg (ref 26.0–34.0)
MCHC: 33.2 g/dL (ref 30.0–36.0)
MCV: 87.8 fL (ref 80.0–100.0)
Platelets: 284 10*3/uL (ref 150–400)
RBC: 4.84 MIL/uL (ref 3.87–5.11)
RDW: 14.1 % (ref 11.5–15.5)
WBC: 8 10*3/uL (ref 4.0–10.5)
nRBC: 0 % (ref 0.0–0.2)

## 2024-01-18 LAB — POCT ACTIVATED CLOTTING TIME
Activated Clotting Time: 481 s
Activated Clotting Time: 389 s

## 2024-01-18 LAB — LIPID PANEL
Cholesterol: 266 mg/dL — ABNORMAL HIGH (ref 0–200)
HDL: 50 mg/dL (ref >40)
LDL Cholesterol: 185 mg/dL — ABNORMAL HIGH (ref 0–99)
Total CHOL/HDL Ratio: 5.3 ratio
Triglycerides: 155 mg/dL — ABNORMAL HIGH (ref <150)
VLDL: 31 mg/dL (ref 0–40)

## 2024-01-18 LAB — HEMOGLOBIN A1C
Hgb A1c MFr Bld: 6.7 % — ABNORMAL HIGH (ref 4.8–5.6)
Mean Plasma Glucose: 145.59 mg/dL

## 2024-01-18 LAB — GLUCOSE, CAPILLARY
Glucose-Capillary: 134 mg/dL — ABNORMAL HIGH (ref 70–99)
Glucose-Capillary: 117 mg/dL — ABNORMAL HIGH (ref 70–99)
Glucose-Capillary: 98 mg/dL (ref 70–99)
Glucose-Capillary: 129 mg/dL — ABNORMAL HIGH (ref 70–99)

## 2024-01-18 LAB — HEPARIN LEVEL (UNFRACTIONATED): Heparin Unfractionated: <0.1 [IU]/mL — ABNORMAL LOW (ref 0.30–0.70)

## 2024-01-18 SURGERY — CORONARY ANGIOGRAPHY (CATH LAB)
Anesthesia: LOCAL

## 2024-01-18 MED ORDER — HEPARIN (PORCINE) IN NACL 1000-0.9 UT/500ML-% IV SOLN
INTRAVENOUS | Status: DC | PRN
  Administered 2024-01-18 (×2): 500 mL via INTRA_ARTERIAL

## 2024-01-18 MED ORDER — TICAGRELOR 90 MG PO TABS
ORAL_TABLET | ORAL | Status: DC | PRN
  Administered 2024-01-18: 180 mg via ORAL

## 2024-01-18 MED ORDER — HYDRALAZINE HCL 20 MG/ML IJ SOLN
INTRAMUSCULAR | Status: DC | PRN
  Administered 2024-01-18 (×2): 10 mg via INTRAVENOUS

## 2024-01-18 MED ORDER — VERAPAMIL HCL 2.5 MG/ML IV SOLN
INTRAVENOUS | Status: AC
  Filled 2024-01-18: qty 2

## 2024-01-18 MED ORDER — NITROGLYCERIN IN D5W 200-5 MCG/ML-% IV SOLN
INTRAVENOUS | Status: AC
  Filled 2024-01-18: qty 250

## 2024-01-18 MED ORDER — NITROGLYCERIN 1 MG/10 ML FOR IR/CATH LAB
INTRA_ARTERIAL | Status: AC
Start: 2024-01-18 — End: 2024-01-19
  Filled 2024-01-18: qty 10

## 2024-01-18 MED ORDER — ROSUVASTATIN CALCIUM 5 MG PO TABS
10.0000 mg | ORAL_TABLET | Freq: Every day | ORAL | Status: DC
  Administered 2024-01-18 – 2024-01-19 (×2): 10 mg via ORAL
  Filled 2024-01-18 (×2): qty 2

## 2024-01-18 MED ORDER — METOPROLOL SUCCINATE ER 25 MG PO TB24
25.0000 mg | ORAL_TABLET | Freq: Every day | ORAL | Status: DC
  Administered 2024-01-18 – 2024-01-19 (×2): 25 mg via ORAL
  Filled 2024-01-18 (×2): qty 1

## 2024-01-18 MED ORDER — HYDRALAZINE HCL 20 MG/ML IJ SOLN
INTRAMUSCULAR | Status: AC
  Filled 2024-01-18: qty 1

## 2024-01-18 MED ORDER — VERAPAMIL HCL 2.5 MG/ML IV SOLN
INTRAVENOUS | Status: DC | PRN
  Administered 2024-01-18: 10 mL via INTRA_ARTERIAL

## 2024-01-18 MED ORDER — TICAGRELOR 90 MG PO TABS
90.0000 mg | ORAL_TABLET | Freq: Two times a day (BID) | ORAL | Status: DC
  Administered 2024-01-18 – 2024-01-19 (×2): 90 mg via ORAL
  Filled 2024-01-18 (×2): qty 1

## 2024-01-18 MED ORDER — FENTANYL CITRATE (PF) 100 MCG/2ML IJ SOLN
INTRAMUSCULAR | Status: AC
  Filled 2024-01-18: qty 2

## 2024-01-18 MED ORDER — MIDAZOLAM HCL 2 MG/2ML IJ SOLN
INTRAMUSCULAR | Status: DC | PRN
  Administered 2024-01-18 (×2): 1 mg via INTRAVENOUS

## 2024-01-18 MED ORDER — HYDRALAZINE HCL 20 MG/ML IJ SOLN: 10.0000 mg | INTRAMUSCULAR | Status: DC | PRN

## 2024-01-18 MED ORDER — IOHEXOL 350 MG/ML SOLN
INTRAVENOUS | Status: DC | PRN
  Administered 2024-01-18: 125 mL

## 2024-01-18 MED ORDER — TICAGRELOR 90 MG PO TABS
ORAL_TABLET | ORAL | Status: AC
  Filled 2024-01-18: qty 2

## 2024-01-18 MED ORDER — LIDOCAINE HCL (PF) 1 % IJ SOLN
INTRAMUSCULAR | Status: DC | PRN
  Administered 2024-01-18: 2 mL

## 2024-01-18 MED ORDER — HEPARIN SODIUM (PORCINE) 1000 UNIT/ML IJ SOLN
INTRAMUSCULAR | Status: AC
  Filled 2024-01-18: qty 10

## 2024-01-18 MED ORDER — LIDOCAINE HCL (PF) 1 % IJ SOLN
INTRAMUSCULAR | Status: AC
  Filled 2024-01-18: qty 30

## 2024-01-18 MED ORDER — HEPARIN SODIUM (PORCINE) 1000 UNIT/ML IJ SOLN
INTRAMUSCULAR | Status: DC | PRN
  Administered 2024-01-18: 5000 [IU] via INTRA_ARTERIAL
  Administered 2024-01-18: 4000 [IU] via INTRA_ARTERIAL

## 2024-01-18 MED ORDER — FENTANYL CITRATE (PF) 100 MCG/2ML IJ SOLN
INTRAMUSCULAR | Status: DC | PRN
  Administered 2024-01-18 (×2): 25 ug via INTRAVENOUS

## 2024-01-18 MED ORDER — MIDAZOLAM HCL 2 MG/2ML IJ SOLN
INTRAMUSCULAR | Status: AC
  Filled 2024-01-18: qty 2

## 2024-01-18 SURGICAL SUPPLY — 21 items
BALLN EMERGE MR 2.5X15 (BALLOONS) ×1 IMPLANT
BALLN ~~LOC~~ EUPHORA RX 3.5X8 (BALLOONS) ×1 IMPLANT
BALLOON EMERGE MR 2.5X15 (BALLOONS) IMPLANT
BALLOON ~~LOC~~ EUPHORA RX 3.5X8 (BALLOONS) IMPLANT
CATH DIAG 6FR PIGTAIL ANGLED (CATHETERS) IMPLANT
CATH DRAGONFLY OPSTAR (CATHETERS) IMPLANT
CATH INFINITI AMBI 6FR TG (CATHETERS) IMPLANT
CATH LAUNCHER 6FR JR4 (CATHETERS) IMPLANT
DEVICE RAD COMP TR BAND LRG (VASCULAR PRODUCTS) IMPLANT
ELECT DEFIB PAD ADLT CADENCE (PAD) IMPLANT
GLIDESHEATH SLEND SS 6F .021 (SHEATH) IMPLANT
GUIDEWIRE VAS SION BLUE 190 (WIRE) IMPLANT
KIT ENCORE 26 ADVANTAGE (KITS) IMPLANT
KIT HEMO VALVE WATCHDOG (MISCELLANEOUS) IMPLANT
PACK CARDIAC CATHETERIZATION (CUSTOM PROCEDURE TRAY) ×1 IMPLANT
SET ATX-X65L (MISCELLANEOUS) IMPLANT
SHEATH PROBE COVER 6X72 (BAG) IMPLANT
STENT SYNERGY XD 3.50X12 (Permanent Stent) IMPLANT
SYNERGY XD 3.50X12 (Permanent Stent) ×2 IMPLANT
WIRE ASAHI PROWATER 180CM (WIRE) IMPLANT
WIRE EMERALD 3MM-J .035X260CM (WIRE) IMPLANT

## 2024-01-18 NOTE — Progress Notes (Addendum)
   Patient Name: Shelly [name] Date of Encounter: 01/18/2024 [facility] Cardiologist: Nicki Guadalajara, MD   Interval Summary  .    No recurrent jaw pain this morning. Family at the bedside.   Vital Signs .    Vitals:   01/17/24 2050 01/18/24 0109 01/18/24 0543 01/18/24 0721  BP: (!) 151/63 (!) 143/61 (!) 153/68 (!) 156/65  Pulse: 79 71 68 67  Resp: 20 20 20 19   Temp: 98.9 F (37.2 C) 97.8 F (36.6 C) 97.8 F (36.6 C) 97.9 F (36.6 C)  TempSrc: Oral Oral Oral Oral  SpO2: 94% 91% 97% 96%  Weight:   84.1 kg   Height:        Intake/Output Summary (Last 24 hours) at 01/18/2024 1033 Last data filed at 01/18/2024 0900 Gross per 24 hour  Intake 1233.53 ml  Output --  Net 1233.53 ml      01/18/2024    5:43 AM 01/17/2024    6:42 PM 01/17/2024    8:51 AM  Last 3 Weights  Weight (lbs) 185 lb 6.4 oz 185 lb 3 oz 182 lb  Weight (kg) 84.097 kg 84 kg 82.555 kg      Telemetry/ECG    Sinus Rhythm - Personally Reviewed  Physical Exam .    GEN: No acute distress.   Neck: No JVD Cardiac: RRR, no murmurs, rubs, or gallops.  Respiratory: Clear to auscultation bilaterally. GI: Soft, nontender, non-distended  MS: No edema  Assessment & Plan .     78 y.o. female with a past medical history of hypertension, coronary calcifications on non contrast CT on 2023,hyperlipidemia intolerant to statin, Diabetes, GERD, TMJ, and breast cancer who was seen 01/17/2024 for the evaluation of Jaw pain.   Jaw pain w/mild elevated troponin -- Presented with 2 episodes of jaw pain with exertion.  Does report a history of TMJ but states these episodes felt somewhat different.  -- EKG with no acute ST/T wave abnormality, high-sensitivity troponin 30>>72>>60 -- On IV heparin, aspirin -- Echocardiogram pending -- Given concern for jaw pain being anginal equivalent, plan for cardiac catheterization today  HTN -- Reported a cough with prior lisinopril, also reported intolerances to amlodipine as well  as losartan -- Will add Toprol 25mg  daily   HLD -- LDL 185, HDL 50 -- Notes indicate a prior allergy possibly to statins though patient is unclear regarding this -- Agreeable to retrial Crestor 20 mg daily  DM -- Denies any reported history of the same, hemoglobin A1c 6.7 -- Will need to consider therapy post cath  GERD TMJ  For questions or updates, please contact [facility] Please consult [email or web address] for contact info under        Signed, Laverda Page, NP    Patient seen and examined. Agree with assessment and plan.  Cath data reviewed with Dr. Roby Lofts in the cardiac Cath Lab.  Patient had high-grade RCA stenosis which was successfully dilated.  Due to more proximal disease, a second stent was placed in the proximal RCA.  Mild nonobstructive disease in the left coronary system.  Plan to continue DAPT for minimum of 1 year aspirin/Brilinta.  Will attempt resumption of lipid-lowering therapy.   Lennette Bihari, MD, [facility] 01/18/2024 3:44 PM

## 2024-01-18 NOTE — Plan of Care (Signed)
  Problem: Education: Goal: Ability to describe self-care measures that may prevent or decrease complications (Diabetes Survival Skills Education) will improve Outcome: Progressing Goal: Individualized Educational Video(s) Outcome: Progressing   Problem: Coping: Goal: Ability to adjust to condition or change in health will improve Outcome: Progressing   Problem: Fluid Volume: Goal: Ability to maintain a balanced intake and output will improve Outcome: Progressing   Problem: Health Behavior/Discharge Planning: Goal: Ability to identify and utilize available resources and services will improve Outcome: Progressing Goal: Ability to manage health-related needs will improve Outcome: Progressing   Problem: Metabolic: Goal: Ability to maintain appropriate glucose levels will improve Outcome: Progressing   Problem: Nutritional: Goal: Maintenance of adequate nutrition will improve Outcome: Progressing Goal: Progress toward achieving an optimal weight will improve Outcome: Progressing   Problem: Skin Integrity: Goal: Risk for impaired skin integrity will decrease Outcome: Progressing   Problem: Tissue Perfusion: Goal: Adequacy of tissue perfusion will improve Outcome: Progressing   Problem: Education: Goal: Understanding of cardiac disease, CV risk reduction, and recovery process will improve Outcome: Progressing Goal: Individualized Educational Video(s) Outcome: Progressing   Problem: Activity: Goal: Ability to tolerate increased activity will improve Outcome: Progressing   Problem: Cardiac: Goal: Ability to achieve and maintain adequate cardiovascular perfusion will improve Outcome: Progressing   Problem: Health Behavior/Discharge Planning: Goal: Ability to safely manage health-related needs after discharge will improve Outcome: Progressing   Problem: Education: Goal: Understanding of CV disease, CV risk reduction, and recovery process will improve Outcome:  Progressing Goal: Individualized Educational Video(s) Outcome: Progressing   Problem: Activity: Goal: Ability to return to baseline activity level will improve Outcome: Progressing   Problem: Cardiovascular: Goal: Ability to achieve and maintain adequate cardiovascular perfusion will improve Outcome: Progressing Goal: Vascular access site(s) Level 0-1 will be maintained Outcome: Progressing   Problem: Health Behavior/Discharge Planning: Goal: Ability to safely manage health-related needs after discharge will improve Outcome: Progressing   Problem: Education: Goal: Knowledge of General Education information will improve Description: Including pain rating scale, medication(s)/side effects and non-pharmacologic comfort measures Outcome: Progressing   Problem: Health Behavior/Discharge Planning: Goal: Ability to manage health-related needs will improve Outcome: Progressing   Problem: Clinical Measurements: Goal: Ability to maintain clinical measurements within normal limits will improve Outcome: Progressing Goal: Will remain free from infection Outcome: Progressing Goal: Diagnostic test results will improve Outcome: Progressing Goal: Respiratory complications will improve Outcome: Progressing Goal: Cardiovascular complication will be avoided Outcome: Progressing   Problem: Activity: Goal: Risk for activity intolerance will decrease Outcome: Progressing   Problem: Nutrition: Goal: Adequate nutrition will be maintained Outcome: Progressing   Problem: Coping: Goal: Level of anxiety will decrease Outcome: Progressing   Problem: Elimination: Goal: Will not experience complications related to bowel motility Outcome: Progressing Goal: Will not experience complications related to urinary retention Outcome: Progressing   Problem: Pain Managment: Goal: General experience of comfort will improve and/or be controlled Outcome: Progressing   Problem: Safety: Goal: Ability  to remain free from injury will improve Outcome: Progressing   Problem: Skin Integrity: Goal: Risk for impaired skin integrity will decrease Outcome: Progressing

## 2024-01-18 NOTE — Progress Notes (Signed)
  Echocardiogram 2D Echocardiogram has been performed.  Ocie Doyne RDCS 01/18/2024, 11:43 AM

## 2024-01-18 NOTE — Progress Notes (Signed)
 PHARMACY - ANTICOAGULATION CONSULT NOTE  Pharmacy Consult for heparin Indication: chest pain/ACS  Allergies  Allergen Reactions   Taxotere [Docetaxel] Swelling   Abraxane [Paclitaxel Protein-Bound Part] Other (See Comments)    3 days after receiving she had one episode of very high BP and HR. This happened again 2 days later during the night.    Crestor [Rosuvastatin Calcium] Itching    GI upset   Lipitor [Atorvastatin Calcium] Itching   Lisinopril Cough   Nsaids Other (See Comments)    GI upset   Pravastatin Itching    GI upset   Sulfonamide Derivatives Rash    Hives, itiching    Patient Measurements: Height: 5\' 5"  (165.1 cm) Weight: 84.1 kg (185 lb 6.4 oz) IBW/kg (Calculated) : 57 Heparin Dosing Weight: 75 Kg  Vital Signs: Temp: 97.9 F (36.6 C) (03/04 0721) Temp Source: Oral (03/04 0721) BP: 156/65 (03/04 0721) Pulse Rate: 67 (03/04 0721)  Labs: Recent Labs    01/17/24 0843 01/17/24 1045 01/17/24 1500 01/17/24 1630 01/17/24 2324 01/18/24 0354 01/18/24 0849  HGB 14.3  --   --   --  13.7 14.1  --   HCT 43.7  --   --   --  41.0 42.5  --   PLT 269  --   --   --  281 284  --   HEPARINUNFRC  --   --   --  <0.10* 0.23*  --  <0.10*  CREATININE 0.84  --   --   --   --  1.11*  --   TROPONINIHS 30* 72* 60*  --   --   --   --     Estimated Creatinine Clearance: 44 mL/min (A) (by C-G formula based on SCr of 1.11 mg/dL (H)).  Assessment: 74 yoF presenting with jaw pain found to have slightly elevated troponin. A prior CT chest on 04/22/22 showed Left and right coronary artery calcifications. No oral anticoagulation reported PTA. CBC WNL. Pharmacy consutled to manage heparin infusion.   Heparin level < 0.1 on 1100 units/hr (subtherapeutic), CBC stable  Goal of Therapy:  Heparin level 0.3-0.7 units/ml Monitor platelets by anticoagulation protocol: Yes   Plan:  -Increase heparin to 1250 units/hr -Will follow plans post cath  Harland German, PharmD Clinical  Pharmacist **Pharmacist phone directory can now be found on [email or web address] ([email or web address]).  Listed under [facility].

## 2024-01-18 NOTE — Interval H&P Note (Signed)
 History and Physical Interval Note:  01/18/2024 6:46 AM  Shelly [name]  has presented today for surgery, with the diagnosis of unstable angina.  The various methods of treatment have been discussed with the patient and family. After consideration of risks, benefits and other options for treatment, the patient has consented to  Procedure(s): LEFT HEART CATH AND CORONARY ANGIOGRAPHY (N/A) as a surgical intervention.  The patient's history has been reviewed, patient examined, no change in status, stable for surgery.  I have reviewed the patient's chart and labs.  Questions were answered to the patient's satisfaction.     [name]

## 2024-01-19 ENCOUNTER — Telehealth (HOSPITAL_COMMUNITY): Payer: Self-pay | Admitting: Pharmacy Technician

## 2024-01-19 ENCOUNTER — Other Ambulatory Visit (HOSPITAL_COMMUNITY): Payer: Self-pay

## 2024-01-19 DIAGNOSIS — I10 Essential (primary) hypertension: Secondary | ICD-10-CM

## 2024-01-19 DIAGNOSIS — R7989 Other specified abnormal findings of blood chemistry: Secondary | ICD-10-CM | POA: Diagnosis not present

## 2024-01-19 DIAGNOSIS — E118 Type 2 diabetes mellitus with unspecified complications: Secondary | ICD-10-CM | POA: Insufficient documentation

## 2024-01-19 DIAGNOSIS — E782 Mixed hyperlipidemia: Secondary | ICD-10-CM

## 2024-01-19 DIAGNOSIS — I200 Unstable angina: Secondary | ICD-10-CM | POA: Diagnosis not present

## 2024-01-19 LAB — BASIC METABOLIC PANEL
Anion gap: 9 (ref 5–15)
BUN: 14 mg/dL (ref 8–23)
CO2: 21 mmol/L — ABNORMAL LOW (ref 22–32)
Calcium: 8.8 mg/dL — ABNORMAL LOW (ref 8.9–10.3)
Chloride: 109 mmol/L (ref 98–111)
Creatinine, Ser: 1 mg/dL (ref 0.44–1.00)
GFR, Estimated: 57 mL/min — ABNORMAL LOW (ref >60)
Glucose, Bld: 192 mg/dL — ABNORMAL HIGH (ref 70–99)
Potassium: 4 mmol/L (ref 3.5–5.1)
Sodium: 139 mmol/L (ref 135–145)

## 2024-01-19 LAB — GLUCOSE, CAPILLARY: Glucose-Capillary: 152 mg/dL — ABNORMAL HIGH (ref 70–99)

## 2024-01-19 LAB — LIPOPROTEIN A (LPA): Lipoprotein (a): 154.1 nmol/L — ABNORMAL HIGH (ref <75.0)

## 2024-01-19 MED ORDER — NITROGLYCERIN 0.4 MG SL SUBL
0.4000 mg | SUBLINGUAL_TABLET | SUBLINGUAL | 2 refills | Status: AC | PRN
  Filled 2024-01-19: qty 25, 7d supply, fill #0

## 2024-01-19 MED ORDER — METFORMIN HCL ER 500 MG PO TB24
500.0000 mg | ORAL_TABLET | Freq: Every day | ORAL | 0 refills | Status: DC
  Filled 2024-01-19: qty 30, 30d supply, fill #0

## 2024-01-19 MED ORDER — METOPROLOL SUCCINATE ER 25 MG PO TB24
25.0000 mg | ORAL_TABLET | Freq: Every day | ORAL | 0 refills | Status: DC
  Filled 2024-01-19: qty 90, 90d supply, fill #0

## 2024-01-19 MED ORDER — TICAGRELOR 90 MG PO TABS
90.0000 mg | ORAL_TABLET | Freq: Two times a day (BID) | ORAL | 2 refills | Status: DC
  Filled 2024-01-19 – 2024-02-14 (×2): qty 60, 30d supply, fill #0
  Filled 2024-03-15: qty 60, 30d supply, fill #1
  Filled 2024-04-14: qty 60, 30d supply, fill #2
  Filled 2024-05-15 – 2024-05-16 (×2): qty 60, 30d supply, fill #3
  Filled 2024-06-14: qty 60, 30d supply, fill #4
  Filled 2024-06-15: qty 60, 30d supply, fill #0
  Filled 2024-07-13: qty 60, 30d supply, fill #1
  Filled 2024-08-16: qty 60, 30d supply, fill #2
  Filled 2024-09-14: qty 60, 30d supply, fill #3

## 2024-01-19 MED ORDER — ROSUVASTATIN CALCIUM 10 MG PO TABS
10.0000 mg | ORAL_TABLET | Freq: Every day | ORAL | 0 refills | Status: DC
  Filled 2024-01-19: qty 90, 90d supply, fill #0

## 2024-01-19 MED ORDER — ASPIRIN 81 MG PO TBEC
81.0000 mg | DELAYED_RELEASE_TABLET | Freq: Every day | ORAL | 2 refills | Status: DC
  Filled 2024-01-19 – 2024-04-14 (×2): qty 90, 90d supply, fill #0
  Filled 2024-07-13 – 2024-07-14 (×2): qty 90, 90d supply, fill #1

## 2024-01-19 NOTE — Telephone Encounter (Signed)
 Patient Product/process development scientist completed.    The patient is insured through [employer]. Patient has Medicare and is not eligible for a copay card, but may be able to apply for patient assistance or Medicare RX Payment Plan (Patient Must reach out to their plan, if eligible for payment plan), if available.    Ran test claim for Brilinta 90 mg and the current 30 day co-pay is $65.38.  Ran test claim for Farxiga 10 mg and the current 30 day co-pay is $84.36.  Ran test claim for Jardiance 10 mg and the current 30 day co-pay is $88.54.  This test claim was processed through [facility]- copay amounts may vary at other pharmacies due to pharmacy/plan contracts, or as the patient moves through the different stages of their insurance plan.     Shelly [name], CPHT Pharmacy Technician III Certified Patient Advocate [facility] Patient Advocate Team Direct Number: [phone]  Fax: [phone]

## 2024-01-19 NOTE — TOC CM/SW Note (Signed)
 Transition of Care [facility]) - Inpatient Brief Assessment   Patient Details  Name: Shelly [name] MRN: [id number] Date of Birth: [birth year]  Transition of Care [facility]) CM/SW Contact:    Gala Lewandowsky, RN Phone Number: 01/19/2024, 2:31 PM   Clinical Narrative: Late Entry: Patient presented for unstable angina. PTA patient was from home with spouse. No home needs identified and patient has transportation home.    Transition of Care Asessment: Insurance and Status: Insurance coverage has been reviewed Patient has primary care physician: Yes Home environment has been reviewed: reviewed Prior level of function:: independent Prior/Current Home Services: No current home services Social Drivers of Health Review: SDOH reviewed no interventions necessary Readmission risk has been reviewed: Yes Transition of care needs: no transition of care needs at this time

## 2024-01-19 NOTE — Discharge Summary (Addendum)
 Discharge Summary    Patient ID: Shelly [name] MRN: [id number]; DOB: 1945/12/05  Admit date: 01/17/2024 Discharge date: 01/19/2024  PCP:  Kristian Covey, MD   [facility] Providers Cardiologist:  Nicki Guadalajara, MD     Discharge Diagnoses    Principal Problem:   Unstable angina [facility]) Active Problems:   Hyperlipidemia   IBS (irritable bowel syndrome)   Essential hypertension   Elevated troponin   Quit using tobacco in remote past   Type 2 diabetes mellitus with complication, without long-term current use of insulin [facility])  Diagnostic Studies/Procedures    Cath: 01/18/2024    Prox LAD lesion is 20% stenosed.   Prox RCA to Mid RCA lesion is 80% stenosed.   Prox RCA lesion is 70% stenosed.   A stent was successfully placed.   A stent was successfully placed.   Post intervention, there is a 0% residual stenosis.   Post intervention, there is a 0% residual stenosis.   1.  High-grade tandem lesions of right coronary artery treated with 2 drug-eluting stents. 2.  Mild nonobstructive disease of the left system.   Recommendation: The results were reviewed with Dr. Tresa Endo.  Dual antiplatelet therapy for 1 year then Brilinta monotherapy indefinitely.  Diagnostic Dominance: Right  Intervention   Echo: 01/18/2024  IMPRESSIONS     1. Left ventricular ejection fraction, by estimation, is 60 to 65%. The  left ventricle has normal function. Left ventricular endocardial border  not optimally defined to evaluate regional wall motion. Left ventricular  diastolic parameters were normal.   2. Right ventricular systolic function was not well visualized. The right  ventricular size is not well visualized.   3. The mitral valve is grossly normal. Trivial mitral valve  regurgitation. No evidence of mitral stenosis.   4. The aortic valve is tricuspid. Aortic valve regurgitation is not  visualized. No aortic stenosis is present.   5. The inferior vena cava is normal in  size with greater than 50%  respiratory variability, suggesting right atrial pressure of 3 mmHg.   FINDINGS   Left Ventricle: Left ventricular ejection fraction, by estimation, is 60  to 65%. The left ventricle has normal function. Left ventricular  endocardial border not optimally defined to evaluate regional wall motion.  Global longitudinal strain performed but   not reported based on interpreter judgement due to suboptimal tracking.  The left ventricular internal cavity size was normal in size. There is no  left ventricular hypertrophy. Left ventricular diastolic parameters were  normal.   Right Ventricle: The right ventricular size is not well visualized. Right  vetricular wall thickness was not well visualized. Right ventricular  systolic function was not well visualized.   Left Atrium: Left atrial size was normal in size.   Right Atrium: Right atrial size was normal in size.   Pericardium: There is no evidence of pericardial effusion.   Mitral Valve: The mitral valve is grossly normal. Trivial mitral valve  regurgitation. No evidence of mitral valve stenosis. MV peak gradient, 4.1  mmHg. The mean mitral valve gradient is 2.0 mmHg.   Tricuspid Valve: The tricuspid valve is grossly normal. Tricuspid valve  regurgitation is not demonstrated. No evidence of tricuspid stenosis.   Aortic Valve: The aortic valve is tricuspid. Aortic valve regurgitation is  not visualized. No aortic stenosis is present. Aortic valve mean gradient  measures 3.0 mmHg. Aortic valve peak gradient measures 4.8 mmHg. Aortic  valve area, by VTI measures 2.25  cm.  Pulmonic Valve: The pulmonic valve was grossly normal. Pulmonic valve  regurgitation is trivial. No evidence of pulmonic stenosis.   Aorta: The aortic root and ascending aorta are structurally normal, with  no evidence of dilitation.   Venous: The inferior vena cava is normal in size with greater than 50%  respiratory variability,  suggesting right atrial pressure of 3 mmHg.   IAS/Shunts: The interatrial septum was not well visualized.  _____________   History of Present Illness     Shelly [name] is a 78 y.o. female with a past medical history of hypertension, coronary calcifications on non contrast CT on 2023,hyperlipidemia intolerant to statin, Diabetes, GERD, TMJ, and breast cancer who was seen 01/17/2024 for the evaluation of Jaw pain.   Shelly [name] has no prior cardiac history or stroke and presented to emergency department after having neck stiffness and jaw pain this morning. Denied any chest pain or shortness of breath. In the emergency department the BMP showed a glucose of 117 and creatinine of 0.84 and was otherwise unremarkable. The CBC was unremarkable with a Hemoglobin of 14.3 and platelets at 269. Chest x-ray did not show any signs of active disease. Troponin's 30-> 72. Initial blood pressure was elevated at 185/79 and a pulse of 79, was given IV lopressor 5 mg. The  pulse dropped to 59 with a BP of 123/98. A prior CT chest on 04/22/22 showed coronary artery calcifications. A1C on 07/2020 was 6.6. LDL on 05/2011 was 215.9   On physical exam Shelly [name] reported having 8/10 burning Jaw pain and headache the day prior for 30 minutes while walking around. The jaw pain improved with rest. The morning of admission she had Jaw pain for 15 minutes while walking to bed after eating breakfast. The pain was not associated with muscle use like past TMJ was and was more severe. Reported taking lisinopril for hypertension but stopping after having a cough. Home blood pressures were mostly 120's/60's. Denied chest pain, shortness of breath, nausea, vomiting, or diaphoresis. Reported smoking 1/5 pack per day for about 40 years and quit about 20 years ago.   Hospital Course    Jaw pain w/mild elevated troponin -- Presented with 2 episodes of jaw pain with exertion.  Does report a history of TMJ but states these episodes felt  somewhat different.  -- EKG with no acute ST/T wave abnormality, high-sensitivity troponin 30>>72>>60 -- underwent cardiac cath noted above with PCI/DES x2 to RCA, mild non-obstructive disease. Recommendations for DAPT with ASA/Brilinta for at least one year, with Brilinta indefinitely  -- seen by cardiac rehab -- continue ASA, Brilinta, statin, Toprol   HTN -- Reported a cough with prior lisinopril, also reported intolerances to amlodipine as well as losartan -- continue Toprol 25mg  daily    HLD -- LDL 185, HDL 50, LP(a) 154 -- Notes indicate a prior allergy possibly to statins though patient is unclear regarding this -- Agreeable to retrial Crestor 10 mg daily -- will refer to lipid clinic   DM -- Denies any reported history of the same, hemoglobin A1c 6.7 -- agreeable to start metformin 500mg  daily -- follow up with PCP   GERD TMJ  General: Well developed, well nourished, female appearing in no acute distress. Head: Normocephalic, atraumatic.  Neck: Supple without bruits, JVD. Lungs:  Resp regular and unlabored, CTA. Heart: RRR, S1, S2, no S3, S4, or murmur; no rub. Abdomen: Soft, non-tender, non-distended with normoactive bowel sounds. No hepatomegaly. No rebound/guarding. No obvious abdominal masses. Extremities: No  clubbing, cyanosis, edema. Distal pedal pulses are 2+ bilaterally. Right radial cath site stable without bruising or hematoma Neuro: Alert and oriented X 3. Moves all extremities spontaneously. Psych: Normal affect.  Patient was seen by myself and Dr. Tresa Endo and deemed stable for discharge home. Follow up arranged in the office. Medications sent to the [facility] pharmacy prior to discharge  Did the patient have an acute coronary syndrome (MI, NSTEMI, STEMI, etc) this admission?:  Yes                               AHA/ACC ACS Clinical Performance & Quality Measures: Aspirin prescribed? - Yes ADP Receptor Inhibitor (Plavix/Clopidogrel, Brilinta/Ticagrelor or  Effient/Prasugrel) prescribed (includes medically managed patients)? - Yes Beta Blocker prescribed? - Yes High Intensity Statin (Lipitor 40-80mg  or Crestor 20-40mg ) prescribed? - No - intolerant  EF assessed during THIS hospitalization? - Yes For EF <40%, was ACEI/ARB prescribed? - Not Applicable (EF >/= 40%) For EF <40%, Aldosterone Antagonist (Spironolactone or Eplerenone) prescribed? - Not Applicable (EF >/= 40%) Cardiac Rehab Phase II ordered (including medically managed patients)? - Yes       The patient will be scheduled for a TOC follow up appointment in 10-14 days.  A message has been sent to the [facility] and Scheduling Pool at the office where the patient should be seen for follow up.  _____________  Discharge Vitals Blood pressure (!) 126/54, pulse 76, temperature 98 F (36.7 C), temperature source Oral, resp. rate 18, height 5\' 5"  (1.651 m), weight 83.6 kg, SpO2 96%.  Filed Weights   01/18/24 0543 01/18/24 1612 01/19/24 0323  Weight: 84.1 kg 84.6 kg 83.6 kg    Labs & Radiologic Studies    CBC Recent Labs    01/17/24 2324 01/18/24 0354  WBC 8.3 8.0  HGB 13.7 14.1  HCT 41.0 42.5  MCV 88.2 87.8  PLT 281 284   Basic Metabolic Panel Recent Labs    11/91/47 0354 01/19/24 0845  NA 139 139  K 3.9 4.0  CL 108 109  CO2 22 21*  GLUCOSE 127* 192*  BUN 21 14  CREATININE 1.11* 1.00  CALCIUM 8.7* 8.8*   Liver Function Tests No results for input(s): "AST", "ALT", "ALKPHOS", "BILITOT", "PROT", "ALBUMIN" in the last 72 hours. No results for input(s): "LIPASE", "AMYLASE" in the last 72 hours. High Sensitivity Troponin:   Recent Labs  Lab 01/17/24 0843 01/17/24 1045 01/17/24 1500  TROPONINIHS 30* 72* 60*    BNP Invalid input(s): "POCBNP" D-Dimer No results for input(s): "DDIMER" in the last 72 hours. Hemoglobin A1C Recent Labs    01/18/24 0354  HGBA1C 6.7*   Fasting Lipid Panel Recent Labs    01/18/24 0354  CHOL 266*  HDL 50  LDLCALC 185*  TRIG 155*   CHOLHDL 5.3   Thyroid Function Tests No results for input(s): "TSH", "T4TOTAL", "T3FREE", "THYROIDAB" in the last 72 hours.  Invalid input(s): "FREET3" _____________  CARDIAC CATHETERIZATION Result Date: 01/18/2024   Prox LAD lesion is 20% stenosed.   Prox RCA to Mid RCA lesion is 80% stenosed.   Prox RCA lesion is 70% stenosed.   A stent was successfully placed.   A stent was successfully placed.   Post intervention, there is a 0% residual stenosis.   Post intervention, there is a 0% residual stenosis. 1.  High-grade tandem lesions of right coronary artery treated with 2 drug-eluting stents. 2.  Mild nonobstructive disease of the  left system. Recommendation: The results were reviewed with Dr. Tresa Endo.  Dual antiplatelet therapy for 1 year then Brilinta monotherapy indefinitely.   ECHOCARDIOGRAM COMPLETE Result Date: 01/18/2024    ECHOCARDIOGRAM REPORT   Patient Name:   Shelly [name] Date of Exam: 01/18/2024 Medical Rec #:  [id number]          Height:       65.0 in Accession #:    [id number]         Weight:       185.4 lb Date of Birth:  02-03-1946         BSA:          1.915 m Patient Age:    26 years           BP:           156/65 mmHg Patient Gender: F                  HR:           67 bpm. Exam Location:  Inpatient Procedure: 2D Echo, 3D Echo, Cardiac Doppler, Color Doppler and Strain Analysis            (Both Spectral and Color Flow Doppler were utilized during            procedure). Indications:    Chest pain  History:        Patient has no prior history of Echocardiogram examinations.                 Risk Factors:Dyslipidemia and Hypertension.  Sonographer:    Vern Claude Referring Phys: [id number] ZANE ADAMS IMPRESSIONS  1. Left ventricular ejection fraction, by estimation, is 60 to 65%. The left ventricle has normal function. Left ventricular endocardial border not optimally defined to evaluate regional wall motion. Left ventricular diastolic parameters were normal.  2. Right ventricular systolic  function was not well visualized. The right ventricular size is not well visualized.  3. The mitral valve is grossly normal. Trivial mitral valve regurgitation. No evidence of mitral stenosis.  4. The aortic valve is tricuspid. Aortic valve regurgitation is not visualized. No aortic stenosis is present.  5. The inferior vena cava is normal in size with greater than 50% respiratory variability, suggesting right atrial pressure of 3 mmHg. FINDINGS  Left Ventricle: Left ventricular ejection fraction, by estimation, is 60 to 65%. The left ventricle has normal function. Left ventricular endocardial border not optimally defined to evaluate regional wall motion. Global longitudinal strain performed but  not reported based on interpreter judgement due to suboptimal tracking. The left ventricular internal cavity size was normal in size. There is no left ventricular hypertrophy. Left ventricular diastolic parameters were normal. Right Ventricle: The right ventricular size is not well visualized. Right vetricular wall thickness was not well visualized. Right ventricular systolic function was not well visualized. Left Atrium: Left atrial size was normal in size. Right Atrium: Right atrial size was normal in size. Pericardium: There is no evidence of pericardial effusion. Mitral Valve: The mitral valve is grossly normal. Trivial mitral valve regurgitation. No evidence of mitral valve stenosis. MV peak gradient, 4.1 mmHg. The mean mitral valve gradient is 2.0 mmHg. Tricuspid Valve: The tricuspid valve is grossly normal. Tricuspid valve regurgitation is not demonstrated. No evidence of tricuspid stenosis. Aortic Valve: The aortic valve is tricuspid. Aortic valve regurgitation is not visualized. No aortic stenosis is present. Aortic valve mean gradient measures 3.0 mmHg. Aortic valve peak gradient measures 4.8  mmHg. Aortic valve area, by VTI measures 2.25 cm. Pulmonic Valve: The pulmonic valve was grossly normal. Pulmonic valve  regurgitation is trivial. No evidence of pulmonic stenosis. Aorta: The aortic root and ascending aorta are structurally normal, with no evidence of dilitation. Venous: The inferior vena cava is normal in size with greater than 50% respiratory variability, suggesting right atrial pressure of 3 mmHg. IAS/Shunts: The interatrial septum was not well visualized.  LEFT VENTRICLE PLAX 2D LVIDd:         3.80 cm      Diastology LVIDs:         2.50 cm      LV e' medial:    7.94 cm/s LV PW:         1.00 cm      LV E/e' medial:  9.4 LV IVS:        1.00 cm      LV e' lateral:   7.51 cm/s LVOT diam:     1.80 cm      LV E/e' lateral: 9.9 LV SV:         56 LV SV Index:   29 LVOT Area:     2.54 cm  LV Volumes (MOD) LV vol d, MOD A2C: 99.2 ml LV vol d, MOD A4C: 164.0 ml LV vol s, MOD A2C: 35.8 ml LV vol s, MOD A4C: 60.5 ml LV SV MOD A2C:     63.4 ml LV SV MOD A4C:     164.0 ml LV SV MOD BP:      81.7 ml RIGHT VENTRICLE             IVC RV S prime:     12.90 cm/s  IVC diam: 1.70 cm LEFT ATRIUM           Index        RIGHT ATRIUM           Index LA diam:      3.40 cm 1.78 cm/m   RA Area:     15.20 cm LA Vol (A2C): 31.5 ml 16.45 ml/m  RA Volume:   44.10 ml  23.02 ml/m LA Vol (A4C): 36.5 ml 19.06 ml/m  AORTIC VALVE                    PULMONIC VALVE AV Area (Vmax):    2.06 cm     PV Vmax:          0.78 m/s AV Area (Vmean):   1.80 cm     PV Peak grad:     2.4 mmHg AV Area (VTI):     2.25 cm     PR End Diast Vel: 3.04 msec AV Vmax:           109.00 cm/s AV Vmean:          73.900 cm/s AV VTI:            0.249 m AV Peak Grad:      4.8 mmHg AV Mean Grad:      3.0 mmHg LVOT Vmax:         88.20 cm/s LVOT Vmean:        52.400 cm/s LVOT VTI:          0.220 m LVOT/AV VTI ratio: 0.88  AORTA Ao Root diam: 2.60 cm Ao Asc diam:  3.10 cm MITRAL VALVE MV Area (PHT): 3.95 cm     SHUNTS MV Area VTI:   2.60 cm  Systemic VTI:  0.22 m MV Peak grad:  4.1 mmHg     Systemic Diam: 1.80 cm MV Mean grad:  2.0 mmHg MV Vmax:       1.01 m/s MV Vmean:       63.3 cm/s MV Decel Time: 192 msec MV E velocity: 74.50 cm/s MV A velocity: 100.00 cm/s MV E/A ratio:  0.74 Lennie Odor MD Electronically signed by Lennie Odor MD Signature Date/Time: 01/18/2024/1:47:16 PM    Final    CT Head Wo Contrast Result Date: 01/17/2024 CLINICAL DATA:  Headache EXAM: CT HEAD WITHOUT CONTRAST TECHNIQUE: Contiguous axial images were obtained from the base of the skull through the vertex without intravenous contrast. RADIATION DOSE REDUCTION: This exam was performed according to the departmental dose-optimization program which includes automated exposure control, adjustment of the mA and/or kV according to patient size and/or use of iterative reconstruction technique. COMPARISON:  CT head 11/17/2018 FINDINGS: Brain: No acute intracranial hemorrhage. No CT evidence of acute infarct. Similar hypoattenuation in the white matter particularly within the left corona radiata likely reflecting chronic microvascular ischemic changes. No edema, mass effect, or midline shift. The basilar cisterns are patent. Ventricles: The ventricles are normal. Vascular: No hyperdense vessel or unexpected calcification. Skull: No acute or aggressive finding. Orbits: Orbits are symmetric. Sinuses: Mucosal thickening in the partially visualized left maxillary sinus. Other: Mastoid air cells are clear. IMPRESSION: 1. No acute intracranial process. 2. Similar mild chronic microvascular ischemic changes. Electronically Signed   By: Emily Filbert M.D.   On: 01/17/2024 12:10   DG Chest Port 1 View Result Date: 01/17/2024 CLINICAL DATA:  Hypertension. EXAM: PORTABLE CHEST 1 VIEW COMPARISON:  12/27/2020 FINDINGS: The heart size and mediastinal contours are within normal limits. Both lungs are clear. The visualized skeletal structures are unremarkable. IMPRESSION: No active disease. Electronically Signed   By: Danae Orleans M.D.   On: 01/17/2024 11:03   Disposition   Pt is being discharged home today in good  condition.  Follow-up Plans & Appointments     Follow-up Information     Burchette, Elberta Fortis, MD Follow up.   Specialty: Family Medicine Why: Please follow up regarding diabetes management Contact information: [address] Christena Flake [facility] [place] [address] [phone]                Discharge Instructions     AMB Referral to [facility]   Complete by: As directed    Reason For Referral: Lipids        Discharge Medications   Allergies as of 01/19/2024       Reactions   Taxotere [docetaxel] Swelling   Abraxane [paclitaxel Protein-bound Part] Other (See Comments)   3 days after receiving she had one episode of very high BP and HR. This happened again 2 days later during the night.    Amlodipine    Caused IBS flare-up. Returned after stopping and restarting med   Artist Calcium] Itching   GI upset   Lipitor [atorvastatin Calcium] Itching   Lisinopril Cough   Losartan    Diffuse arthralgias and back pain. Returned after stopping and restarting med   Nsaids Other (See Comments)   GI upset   Pravastatin Itching   GI upset   Sulfonamide Derivatives Rash   Hives, itiching        Medication List     TAKE these medications    acetaminophen 500 MG tablet Commonly known as: TYLENOL Take 500 mg by mouth every 6 (six) hours  as needed for headache or mild pain.   aspirin EC 81 MG tablet Take 1 tablet (81 mg total) by mouth daily. Swallow whole.   calcium-vitamin D 500-5 MG-MCG tablet Commonly known as: OSCAL WITH D Take 1 tablet by mouth daily.   clidinium-chlordiazePOXIDE 5-2.5 MG capsule Commonly known as: LIBRAX TAKE 1 CAPSULE EVERY DAY AS NEEDED What changed: See the new instructions.   denosumab 60 MG/ML Sosy injection Commonly known as: PROLIA Inject 60 mg into the skin every 6 (six) months. Last inj was in October   loratadine 10 MG tablet Commonly known as: CLARITIN Take 10 mg by mouth daily.   metformin 500 MG (OSM) 24  hr tablet Commonly known as: FORTAMET Take 1 tablet (500 mg total) by mouth daily with breakfast.   metoprolol succinate 25 MG 24 hr tablet Commonly known as: TOPROL-XL Take 1 tablet (25 mg total) by mouth daily.   nitroGLYCERIN 0.4 MG SL tablet Commonly known as: NITROSTAT Place 1 tablet (0.4 mg total) under the tongue every 5 (five) minutes x 3 doses as needed for chest pain.   rosuvastatin 10 MG tablet Commonly known as: CRESTOR Take 1 tablet (10 mg total) by mouth daily.   ticagrelor 90 MG Tabs tablet Commonly known as: BRILINTA Take 1 tablet (90 mg total) by mouth 2 (two) times daily.           Outstanding Labs/Studies   FLP/LFTs in 8 weeks  BMET at follow up  Duration of Discharge Encounter: APP Time: 30 minutes   Signed, Laverda Page, NP 01/19/2024, 10:12 AM   Patient seen and examined. Agree with assessment and plan.  Cath data reviewed with patient with ultimate placement of mid and proximal RCA stents by Dr. Roby Lofts.  No recurrent jaw pain following successful intervention.  With LDL cholesterol at 185 and significant elevation of LP(a) at 154.1, discussed need for aggressive lipid management particularly with markedly elevated cholesterol and LP(a), to be evaluated by pharmacy in lipid clinic.  Patient was restarted on low-dose statin therapy with rosuvastatin 10 mg patient is also having discussion with research team regarding potential enrollment in clinical trial of oral factor XI a inhibitor with Librexia.  Will need improved glucose control.  Discharge note by Laverda Page reviewed.  Okay for discharge today. 32 minutes Lennette Bihari, MD, [facility] 01/19/2024 11:31 AM

## 2024-01-19 NOTE — Plan of Care (Signed)
  Problem: Metabolic: Goal: Ability to maintain appropriate glucose levels will improve Outcome: Progressing   Problem: Nutritional: Goal: Maintenance of adequate nutrition will improve Outcome: Progressing   Problem: Activity: Goal: Ability to tolerate increased activity will improve Outcome: Progressing   Problem: Cardiac: Goal: Ability to achieve and maintain adequate cardiovascular perfusion will improve Outcome: Progressing   Problem: Activity: Goal: Ability to return to baseline activity level will improve Outcome: Progressing   Problem: Health Behavior/Discharge Planning: Goal: Ability to manage health-related needs will improve Outcome: Progressing   Problem: Clinical Measurements: Goal: Respiratory complications will improve Outcome: Progressing Goal: Cardiovascular complication will be avoided Outcome: Progressing   Problem: Activity: Goal: Risk for activity intolerance will decrease Outcome: Progressing   Problem: Nutrition: Goal: Adequate nutrition will be maintained Outcome: Progressing   Problem: Elimination: Goal: Will not experience complications related to urinary retention Outcome: Progressing   Problem: Safety: Goal: Ability to remain free from injury will improve Outcome: Progressing

## 2024-01-19 NOTE — Progress Notes (Signed)
 CARDIAC REHAB PHASE I   PRE:  Rate/Rhythm: 70 SR    BP: sitting 136/65    SpO2:   MODE:  Ambulation: 430 ft   POST:  Rate/Rhythm: 79 SR    BP: sitting 132/69     SpO2:   Tolerated well, no c/o. Discussed with pt stents, Brilinta importance, restrictions, diet, exercise, NTG and CRPII. Pt receptive, eager to make changes. Eager to participate in [facility] and will send referral to [place] [id number]   Ethelda Chick BS, ACSM-CEP 01/19/2024 11:24 AM

## 2024-01-20 ENCOUNTER — Telehealth: Payer: Self-pay

## 2024-01-20 NOTE — Transitions of Care (Post Inpatient/ED Visit) (Signed)
 01/20/2024  Name: Shelly [name] MRN: [id number] DOB: Aug 21, 1946  Today's TOC FU Call Status: Today's TOC FU Call Status:: Successful TOC FU Call Completed TOC FU Call Complete Date: 01/20/24 Patient's Name and Date of Birth confirmed.  Transition Care Management Follow-up Telephone Call Date of Discharge: 01/21/24 Discharge Facility: [facility]) Type of Discharge: Inpatient Admission Primary Inpatient Discharge Diagnosis:: angina How have you been since you were released from the hospital?: Better Any questions or concerns?: No  Items Reviewed: Did you receive and understand the discharge instructions provided?: Yes Medications obtained,verified, and reconciled?: Yes (Medications Reviewed) Any new allergies since your discharge?: No Dietary orders reviewed?: Yes Do you have support at home?: Yes People in Home: spouse  Medications Reviewed Today: Medications Reviewed Today     Reviewed by Karena Addison, LPN (Licensed Practical Nurse) on 01/20/24 at [phone]  Med List Status: <None>   Medication Order Taking? Sig Documenting Provider Last Dose Status Informant  acetaminophen (TYLENOL) 500 MG tablet [id number] No Take 500 mg by mouth every 6 (six) hours as needed for headache or mild pain. [provider] 01/16/2024 Active Self, Pharmacy Records  aspirin EC 81 MG tablet [id number]  Take 1 tablet (81 mg total) by mouth daily. Swallow whole. Arty Baumgartner, NP  Active   calcium-vitamin D Ruthell Rummage WITH D) 500-5 MG-MCG tablet [id number] No Take 1 tablet by mouth daily. [provider] Past Week Active Self, Pharmacy Records  clidinium-chlordiazePOXIDE (LIBRAX) 5-2.5 MG capsule [id number] No TAKE 1 CAPSULE EVERY DAY AS NEEDED  Patient taking differently: Take 1 capsule by mouth daily as needed (bowel issue).   Eulis Foster, FNP 01/16/2024 Active Self, Pharmacy Records  denosumab (PROLIA) 60 MG/ML SOSY injection [id number] No Inject 60 mg into the skin every 6  (six) months. Last inj was in October [provider] Taking Active Self, Pharmacy Records           Med Note Rollene Rotunda Jan 17, 2024  9:38 AM) Next dose expected in April  loratadine (CLARITIN) 10 MG tablet [id number] No Take 10 mg by mouth daily. [provider] 01/16/2024 Active Self, Pharmacy Records  metFORMIN (GLUCOPHAGE-XR) 500 MG 24 hr tablet [id number]  Take 1 tablet (500 mg total) by mouth daily with breakfast. Laverda Page B, NP  Active   metoprolol succinate (TOPROL-XL) 25 MG 24 hr tablet [id number]  Take 1 tablet (25 mg total) by mouth daily. Arty Baumgartner, NP  Active   nitroGLYCERIN (NITROSTAT) 0.4 MG SL tablet [id number]  Place 1 tablet (0.4 mg total) under the tongue every 5 (five) minutes x 3 doses as needed for chest pain. Arty Baumgartner, NP  Active   Discontinued 01/25/12 1113 (Error)   Discontinued 01/25/12 1106 (Error)   Discontinued 12/12/19 2049   rosuvastatin (CRESTOR) 10 MG tablet [id number]  Take 1 tablet (10 mg total) by mouth daily. Arty Baumgartner, NP  Active   ticagrelor (BRILINTA) 90 MG TABS tablet [id number]  Take 1 tablet (90 mg total) by mouth 2 (two) times daily. Arty Baumgartner, NP  Active             Home Care and Equipment/Supplies: Were Home Health Services Ordered?: NA Any new equipment or medical supplies ordered?: NA  Functional Questionnaire: Do you need assistance with bathing/showering or dressing?: No Do you need assistance with meal preparation?: No Do you need assistance with eating?: No Do you have difficulty maintaining continence: No Do you need  assistance with getting out of bed/getting out of a chair/moving?: No Do you have difficulty managing or taking your medications?: No  Follow up appointments reviewed: PCP Follow-up appointment confirmed?: Yes Date of PCP follow-up appointment?: 01/21/24 Follow-up Provider: [facility] Follow-up appointment confirmed?:  Yes Date of Specialist follow-up appointment?: 01/26/24 Follow-Up Specialty Provider:: [facility]ent Do you need transportation to your follow-up appointment?: No Do you understand care options if your condition(s) worsen?: Yes-patient verbalized understanding    SIGNATURE Karena Addison, LPN [facility] Nurse Health Advisor Direct Dial [phone]

## 2024-01-21 ENCOUNTER — Ambulatory Visit: Admitting: Family Medicine

## 2024-01-21 ENCOUNTER — Encounter: Payer: Self-pay | Admitting: Family Medicine

## 2024-01-21 VITALS — BP 168/80 | HR 64 | Temp 98.4°F | Wt 187.8 lb

## 2024-01-21 DIAGNOSIS — E785 Hyperlipidemia, unspecified: Secondary | ICD-10-CM | POA: Diagnosis not present

## 2024-01-21 DIAGNOSIS — I25110 Atherosclerotic heart disease of native coronary artery with unstable angina pectoris: Secondary | ICD-10-CM

## 2024-01-21 DIAGNOSIS — Z7984 Long term (current) use of oral hypoglycemic drugs: Secondary | ICD-10-CM | POA: Diagnosis not present

## 2024-01-21 DIAGNOSIS — I10 Essential (primary) hypertension: Secondary | ICD-10-CM | POA: Diagnosis not present

## 2024-01-21 DIAGNOSIS — E118 Type 2 diabetes mellitus with unspecified complications: Secondary | ICD-10-CM

## 2024-01-21 NOTE — Patient Instructions (Signed)
 Set up 3 month follow up.   Set up to get labs in 2 months.

## 2024-01-21 NOTE — Progress Notes (Signed)
 Established Patient Office Visit  Subjective   Patient ID: Shelly [name], female    DOB: 11-14-1946  Age: 78 y.o. MRN: [id number]  Chief Complaint  Patient presents with   Medical Management of Chronic Issues    HPI   Shelly [name] is seen for hospital follow-up.  Her past medical history significant for hypertension, GERD, IBS, hyperglycemia, osteoporosis, history of breast cancer, hyperlipidemia.  Previous reported intolerance with statins.  She has also had intolerance with multiple blood pressure medications.  She quit smoking about 20 years ago.  She states that last Sunday she was out shopping with her daughter when she had sudden onset of throbbing type pain in her neck and jaw and those symptoms eventually subsided.  She had no associated nausea, diaphoresis, arm, or chest pain.  Her symptoms eventually resolved.  She did not seek out any care at that time.  Monday after eating breakfast she had recurrence of the same symptoms this time her symptoms were more severe.  Her husband noted that she seemed to be clammy and "limp ".  No syncope.  EMS was called and she was taken in for further evaluation.  In the ER she had a chest x-ray which showed no acute findings.  Initial troponin was 30 and subsequent troponin 72.  She had initial elevated blood pressure which improved following IV Lopressor.  EKG no acute ST-T wave changes.  Underwent cardiac cath and had tandem drug eluting stents to the RCA.  Recommendation for dual antiplatelet therapy with aspirin and Brilinta for at least 1 year with Brilinta indefinitely.  She has been referred to cardiac rehab.  She is now taking aspirin, Brilinta, rosuvastatin, and Toprol-XL.  She was also started on metformin extended release 500 mg once daily.  Previously, she has had intolerance with multiple medications.  She does have longstanding IBS.  We have tried multiple blood pressure medications including ACE, ARB, and calcium channel blocker and  she could not tolerate any of those.  She currently is tolerating Crestor 10 mg daily.  She did have recent A1c 6.7%.  She does have some concerns about tolerating metformin with her IBS history.  She denies any recurrent neck/jaw symptoms or other concerns since discharge.  Home blood pressures have been mostly controlled although she had reading up over 150s systolic today.  Most of her readings been 120s to 130s systolic  Past Medical History:  Diagnosis Date   Allergy    Arthritis    wrist, shoulder    Cancer (HCC) 08/2019   left breast IDC   Diverticulitis    Endometrial polyp    Family history of breast cancer    GERD (gastroesophageal reflux disease)    occasional - diet controlled   Hx of adenomatous colonic polyps    Hyperlipidemia    IBS (irritable bowel syndrome)    Missed abortion    x 1 - resolved - no surgery   Osteoporosis    SVD (spontaneous vaginal delivery)    x 1   Transient vision disturbance 2019   was evaluated by ED, no findings   Vitamin D deficiency    Past Surgical History:  Procedure Laterality Date   BREAST EXCISIONAL BIOPSY Right 08/2019   Benign, lesion    BREAST LUMPECTOMY Left 09/2019   BREAST LUMPECTOMY WITH RADIOACTIVE SEED AND SENTINEL LYMPH NODE BIOPSY Left 10/06/2019   Procedure: LEFT BREAST LUMPECTOMY  X2 WITH RADIOACTIVE SEED X2  AND SENTINEL LYMPH NODE BIOPSY;  Surgeon: Griselda Miner, MD;  Location: [facility];  Service: General;  Laterality: Left;   BREAST LUMPECTOMY WITH RADIOACTIVE SEED LOCALIZATION Right 10/06/2019   Procedure: RIGHT BREAST LUMPECTOMY WITH RADIOACTIVE SEED LOCALIZATION;  Surgeon: Griselda Miner, MD;  Location: [facility];  Service: General;  Laterality: Right;   BRONCHIAL BIOPSY  12/27/2020   Procedure: BRONCHIAL BIOPSIES;  Surgeon: Josephine Igo, DO;  Location: [facility];  Service: Pulmonary;;   BRONCHIAL BRUSHINGS  12/27/2020   Procedure: BRONCHIAL BRUSHINGS;  Surgeon: Josephine Igo, DO;  Location: [facility];  Service: Pulmonary;;   BRONCHIAL NEEDLE ASPIRATION BIOPSY  12/27/2020   Procedure: BRONCHIAL NEEDLE ASPIRATION BIOPSIES;  Surgeon: Josephine Igo, DO;  Location: [facility];  Service: Pulmonary;;   BRONCHIAL WASHINGS  12/27/2020   Procedure: BRONCHIAL WASHINGS;  Surgeon: Josephine Igo, DO;  Location: [facility];  Service: Pulmonary;;   COLONOSCOPY     CORONARY ANGIOGRAPHY N/A 01/18/2024   Procedure: CORONARY ANGIOGRAPHY;  Surgeon: [name], MD;  Location: [facility];  Service: Cardiovascular;  Laterality: N/A;   CORONARY IMAGING/OCT N/A 01/18/2024   Procedure: CORONARY IMAGING/OCT;  Surgeon: [name], MD;  Location: [facility];  Service: Cardiovascular;  Laterality: N/A;   CORONARY STENT INTERVENTION N/A 01/18/2024   Procedure: CORONARY STENT INTERVENTION;  Surgeon: [name], MD;  Location: [facility];  Service: Cardiovascular;  Laterality: N/A;   DIAGNOSTIC LAPAROSCOPY     cysts   DILATATION & CURETTAGE/HYSTEROSCOPY WITH TRUECLEAR N/A 10/31/2013   Procedure: DILATATION & CURETTAGE/HYSTEROSCOPY WITH TRUCLEAR;  Surgeon: Zelphia Cairo, MD;  Location: [facility];  Service: Gynecology;  Laterality: N/A;   DILATION AND CURETTAGE OF UTERUS     MASS EXCISION Left 11/06/2019   Procedure: EXCISION NIPPLE AND AREOLA  MARGIN LEFT BREAST;  Surgeon: Griselda Miner, MD;  Location: [facility];  Service: General;  Laterality: Left;   TONSILLECTOMY  1952   VIDEO BRONCHOSCOPY WITH ENDOBRONCHIAL NAVIGATION Bilateral 12/27/2020   Procedure: VIDEO BRONCHOSCOPY WITH ENDOBRONCHIAL NAVIGATION;  Surgeon: Josephine Igo, DO;  Location: [facility];  Service: Pulmonary;  Laterality: Bilateral;   VIDEO BRONCHOSCOPY WITH ENDOBRONCHIAL ULTRASOUND Bilateral 12/27/2020   Procedure: VIDEO BRONCHOSCOPY WITH ENDOBRONCHIAL ULTRASOUND;  Surgeon: Josephine Igo, DO;  Location: [facility];  Service: Pulmonary;  Laterality:  Bilateral;   WISDOM TOOTH EXTRACTION      reports that she quit smoking about 16 years ago. Her smoking use included cigarettes. She started smoking about 36 years ago. She has a 2 pack-year smoking history. She has never used smokeless tobacco. She reports current alcohol use of about 14.0 standard drinks of alcohol per week. She reports that she does not use drugs. family history includes Brain cancer in her paternal uncle; Breast cancer (age of onset: 45) in her cousin; Breast cancer (age of onset: 96) in her maternal aunt and maternal aunt; Cancer in her maternal grandfather, maternal uncle, and another family member; Colon polyps in her mother; Depression in her brother; Diabetes in her mother; Heart disease in an other family member; Hyperlipidemia in her father and mother; Hypertension in her mother and another family member; Leukemia (age of onset: 45) in her mother; Lung cancer in her father and maternal grandfather; Throat cancer in her paternal uncle; Uterine cancer in her paternal grandmother. Allergies  Allergen Reactions   Taxotere [Docetaxel] Swelling   Abraxane [Paclitaxel Protein-Bound Part] Other (See Comments)    3 days  after receiving she had one episode of very high BP and HR. This happened again 2 days later during the night.    Amlodipine     Caused IBS flare-up. Returned after stopping and restarting med   [employer] Calcium] Itching    GI upset   Lipitor [Atorvastatin Calcium] Itching   Lisinopril Cough   Losartan     Diffuse arthralgias and back pain. Returned after stopping and restarting med   Nsaids Other (See Comments)    GI upset   Pravastatin Itching    GI upset   Sulfonamide Derivatives Rash    Hives, itiching    Review of Systems  Constitutional:  Negative for chills, fever and malaise/fatigue.  Eyes:  Negative for blurred vision.  Respiratory:  Negative for shortness of breath.   Cardiovascular:  Negative for chest pain.  Neurological:   Negative for dizziness, weakness and headaches.      Objective:     BP (!) 168/80   Pulse 64   Temp 98.4 F (36.9 C) (Oral)   Wt 187 lb 12.8 oz (85.2 kg)   SpO2 95%   BMI 31.25 kg/m  BP Readings from Last 3 Encounters:  01/21/24 (!) 168/80  01/19/24 (!) 147/77  12/17/23 (!) 150/88   Wt Readings from Last 3 Encounters:  01/21/24 187 lb 12.8 oz (85.2 kg)  01/19/24 184 lb 4.8 oz (83.6 kg)  12/17/23 189 lb (85.7 kg)      Physical Exam Vitals reviewed.  Constitutional:      Appearance: She is well-developed.  Eyes:     Pupils: Pupils are equal, round, and reactive to light.  Neck:     Thyroid: No thyromegaly.     Vascular: No JVD.  Cardiovascular:     Rate and Rhythm: Normal rate and regular rhythm.     Heart sounds:     No gallop.  Pulmonary:     Effort: Pulmonary effort is normal. No respiratory distress.     Breath sounds: Normal breath sounds. No wheezing or rales.  Musculoskeletal:     Cervical back: Neck supple.     Right lower leg: No edema.     Left lower leg: No edema.  Neurological:     Mental Status: She is alert.      No results found for any visits on 01/21/24.  Last CBC Lab Results  Component Value Date   WBC 8.0 01/18/2024   HGB 14.1 01/18/2024   HCT 42.5 01/18/2024   MCV 87.8 01/18/2024   MCH 29.1 01/18/2024   RDW 14.1 01/18/2024   PLT 284 01/18/2024   Last metabolic panel Lab Results  Component Value Date   GLUCOSE 192 (H) 01/19/2024   NA 139 01/19/2024   K 4.0 01/19/2024   CL 109 01/19/2024   CO2 21 (L) 01/19/2024   BUN 14 01/19/2024   CREATININE 1.00 01/19/2024   GFRNONAA 57 (L) 01/19/2024   CALCIUM 8.8 (L) 01/19/2024   PROT 6.9 09/23/2023   ALBUMIN 4.2 09/23/2023   BILITOT 0.4 09/23/2023   ALKPHOS 63 09/23/2023   AST 15 09/23/2023   ALT 14 09/23/2023   ANIONGAP 9 01/19/2024   Last lipids Lab Results  Component Value Date   CHOL 266 (H) 01/18/2024   HDL 50 01/18/2024   LDLCALC 185 (H) 01/18/2024   LDLDIRECT  215.9 06/04/2011   TRIG 155 (H) 01/18/2024   CHOLHDL 5.3 01/18/2024   Last hemoglobin A1c Lab Results  Component Value Date   HGBA1C  6.7 (H) 01/18/2024      The 10-year ASCVD risk score (Arnett DK, et al., 2019) is: 70%    Assessment & Plan:   #1 recent unstable angina with non-ST elevation MI with cath showing tandem RCA lesions which were stented successfully.  Patient currently pain-free.  She is on dual antiplatelet therapy with aspirin and Brilinta She has been referred for cardiac rehab Continue close follow up with Cardiology.  #2 hyperlipidemia.  Reported intolerance with statins but currently tolerating rosuvastatin 10 mg daily.  She previously reported itching with rosuvastatin but so far has had none.  Be in touch for any hives or diffuse pruritus and reemphasized importance of low saturated fat diet.  Will need follow-up fasting lipid and hepatic panel in 2 months.  Order placed  #3 history of hyperglycemia.  Recent A1c 6.7%.  Patient currently tolerating metformin extended release 500 mg once daily.  Continue current dosage and repeat A1c in 3 months.  If she develops any intolerance with metformin may be a candidate for SGLT2 medication especially with her cardiac history  #4 hypertension.  Has been very challenging to manage in the past.  She has had multiple various side effects and intolerance to multiple drugs including ACE, ARB, calcium channel blocker.  Currently tolerating low-dose Toprol-XL.  Blood pressure repeat today after rest 152/68.  Recommend close monitoring over the next several days.  If consistently over 140 systolic we may need to look at additional medication.  Her side effects with losartan were somewhat soft with arthralgias and back pain and may be worth exploring trial of another ARB such as Micardis   Return in about 3 months (around 04/22/2024).    Evelena Peat, MD

## 2024-01-26 ENCOUNTER — Ambulatory Visit: Attending: Physician Assistant | Admitting: Cardiology

## 2024-01-26 ENCOUNTER — Encounter: Payer: Self-pay | Admitting: Physician Assistant

## 2024-01-26 VITALS — BP 130/60 | HR 57 | Ht 65.0 in | Wt 186.0 lb

## 2024-01-26 DIAGNOSIS — E782 Mixed hyperlipidemia: Secondary | ICD-10-CM | POA: Diagnosis not present

## 2024-01-26 DIAGNOSIS — E118 Type 2 diabetes mellitus with unspecified complications: Secondary | ICD-10-CM | POA: Insufficient documentation

## 2024-01-26 DIAGNOSIS — I2510 Atherosclerotic heart disease of native coronary artery without angina pectoris: Secondary | ICD-10-CM | POA: Insufficient documentation

## 2024-01-26 DIAGNOSIS — I10 Essential (primary) hypertension: Secondary | ICD-10-CM | POA: Insufficient documentation

## 2024-01-26 NOTE — Progress Notes (Unsigned)
 Cardiology Office Note:  .   Date:  01/26/2024  ID:  Shelly [name], DOB Oct 05, 1946, MRN [id number] PCP: Kristian Covey, MD  [facility] Providers Cardiologist:  Nicki Guadalajara, MD {   History of Present Illness: .   Shelly [name] is a 78 y.o. female CAD status post DES x 2 to RCA, hypertension, hyperlipidemia, type 2 diabetes, GERD, TMJ, breast cancer, previous smoker.  Patient previously with no prior cardiac history.  Admitted in March 2025 and presented with exertional jaw pain/neck stiffness but no frank chest pain or shortness of breath.  Troponins were mildly elevated 30-72.  Taken for cardiac catheterization that demonstrated 80% stenosis of proximal/mid RCA, status post successful placement of 2 DES.  Otherwise mild nonobstructive disease in the left system.  Discharged on DAPT with Brilinta.   Today she presents back for follow-up and has done extremely well with no complaints.  She notes sleeping somewhat poorly but reports to being anxious with her recent hospitalization.  Also has issues with frequent urination at night but with known history of IBS and drinks wine nightly, discussed this is an irritant and encourage reducing frequency and quantity.  Reports no bleeding or issues with her Brilinta.  100% compliant with all of her medications.  No dyspnea on the Brilinta.  ROS: Denies: Chest pain, shortness of breath, orthopnea, peripheral edema, palpitations, decreased exercise intolerance,   Studies Reviewed: Marland Kitchen   EKG Interpretation Date/Time:  Wednesday January 26 2024 13:31:03 EDT Ventricular Rate:  57 PR Interval:  156 QRS Duration:  80 QT Interval:  418 QTC Calculation: 406 R Axis:   5  Text Interpretation: Sinus bradycardia When compared with ECG of 19-Jan-2024 08:48, No significant change was found No significant changes. No acute STTW changes. Confirmed by Yvonna Alanis [id number]) on 01/26/2024 1:42:40 PM  Cardiac Studies & Procedures    ______________________________________________________________________________________________ CARDIAC CATHETERIZATION  CARDIAC CATHETERIZATION 01/18/2024  Narrative   Prox LAD lesion is 20% stenosed.   Prox RCA to Mid RCA lesion is 80% stenosed.   Prox RCA lesion is 70% stenosed.   A stent was successfully placed.   A stent was successfully placed.   Post intervention, there is a 0% residual stenosis.   Post intervention, there is a 0% residual stenosis.  1.  High-grade tandem lesions of right coronary artery treated with 2 drug-eluting stents. 2.  Mild nonobstructive disease of the left system.  Recommendation: The results were reviewed with Dr. Tresa Endo.  Dual antiplatelet therapy for 1 year then Brilinta monotherapy indefinitely.  Findings Coronary Findings Diagnostic  Dominance: Right  Left Anterior Descending The vessel exhibits minimal luminal irregularities. Prox LAD lesion is 20% stenosed.  Left Circumflex The vessel exhibits minimal luminal irregularities.  Right Coronary Artery Prox RCA lesion is 70% stenosed. Prox RCA to Mid RCA lesion is 80% stenosed.  Intervention  Prox RCA lesion Stent A stent was successfully placed. Post-Intervention Lesion Assessment The intervention was successful. Pre-interventional TIMI flow is 3. Post-intervention TIMI flow is 3. There is a 0% residual stenosis post intervention.  Prox RCA to Mid RCA lesion Stent A stent was successfully placed. Post-Intervention Lesion Assessment The intervention was successful. Pre-interventional TIMI flow is 3. Post-intervention TIMI flow is 3. There is a 0% residual stenosis post intervention.     ECHOCARDIOGRAM  ECHOCARDIOGRAM COMPLETE 01/18/2024  Narrative ECHOCARDIOGRAM REPORT    Patient Name:   Shelly [name] Date of Exam: 01/18/2024 Medical Rec #:  [id number]  Height:       65.0 in Accession #:    [id number]         Weight:       185.4 lb Date of Birth:  1946-03-21          BSA:          1.915 m Patient Age:    77 years           BP:           156/65 mmHg Patient Gender: F                  HR:           67 bpm. Exam Location:  Inpatient  Procedure: 2D Echo, 3D Echo, Cardiac Doppler, Color Doppler and Strain Analysis (Both Spectral and Color Flow Doppler were utilized during procedure).  Indications:    Chest pain  History:        Patient has no prior history of Echocardiogram examinations. Risk Factors:Dyslipidemia and Hypertension.  Sonographer:    Vern Claude Referring Phys: [id number] ZANE ADAMS  IMPRESSIONS   1. Left ventricular ejection fraction, by estimation, is 60 to 65%. The left ventricle has normal function. Left ventricular endocardial border not optimally defined to evaluate regional wall motion. Left ventricular diastolic parameters were normal. 2. Right ventricular systolic function was not well visualized. The right ventricular size is not well visualized. 3. The mitral valve is grossly normal. Trivial mitral valve regurgitation. No evidence of mitral stenosis. 4. The aortic valve is tricuspid. Aortic valve regurgitation is not visualized. No aortic stenosis is present. 5. The inferior vena cava is normal in size with greater than 50% respiratory variability, suggesting right atrial pressure of 3 mmHg.  FINDINGS Left Ventricle: Left ventricular ejection fraction, by estimation, is 60 to 65%. The left ventricle has normal function. Left ventricular endocardial border not optimally defined to evaluate regional wall motion. Global longitudinal strain performed but not reported based on interpreter judgement due to suboptimal tracking. The left ventricular internal cavity size was normal in size. There is no left ventricular hypertrophy. Left ventricular diastolic parameters were normal.  Right Ventricle: The right ventricular size is not well visualized. Right vetricular wall thickness was not well visualized. Right ventricular systolic  function was not well visualized.  Left Atrium: Left atrial size was normal in size.  Right Atrium: Right atrial size was normal in size.  Pericardium: There is no evidence of pericardial effusion.  Mitral Valve: The mitral valve is grossly normal. Trivial mitral valve regurgitation. No evidence of mitral valve stenosis. MV peak gradient, 4.1 mmHg. The mean mitral valve gradient is 2.0 mmHg.  Tricuspid Valve: The tricuspid valve is grossly normal. Tricuspid valve regurgitation is not demonstrated. No evidence of tricuspid stenosis.  Aortic Valve: The aortic valve is tricuspid. Aortic valve regurgitation is not visualized. No aortic stenosis is present. Aortic valve mean gradient measures 3.0 mmHg. Aortic valve peak gradient measures 4.8 mmHg. Aortic valve area, by VTI measures 2.25 cm.  Pulmonic Valve: The pulmonic valve was grossly normal. Pulmonic valve regurgitation is trivial. No evidence of pulmonic stenosis.  Aorta: The aortic root and ascending aorta are structurally normal, with no evidence of dilitation.  Venous: The inferior vena cava is normal in size with greater than 50% respiratory variability, suggesting right atrial pressure of 3 mmHg.  IAS/Shunts: The interatrial septum was not well visualized.   LEFT VENTRICLE PLAX 2D LVIDd:  3.80 cm      Diastology LVIDs:         2.50 cm      LV e' medial:    7.94 cm/s LV PW:         1.00 cm      LV E/e' medial:  9.4 LV IVS:        1.00 cm      LV e' lateral:   7.51 cm/s LVOT diam:     1.80 cm      LV E/e' lateral: 9.9 LV SV:         56 LV SV Index:   29 LVOT Area:     2.54 cm  LV Volumes (MOD) LV vol d, MOD A2C: 99.2 ml LV vol d, MOD A4C: 164.0 ml LV vol s, MOD A2C: 35.8 ml LV vol s, MOD A4C: 60.5 ml LV SV MOD A2C:     63.4 ml LV SV MOD A4C:     164.0 ml LV SV MOD BP:      81.7 ml  RIGHT VENTRICLE             IVC RV S prime:     12.90 cm/s  IVC diam: 1.70 cm  LEFT ATRIUM           Index        RIGHT ATRIUM            Index LA diam:      3.40 cm 1.78 cm/m   RA Area:     15.20 cm LA Vol (A2C): 31.5 ml 16.45 ml/m  RA Volume:   44.10 ml  23.02 ml/m LA Vol (A4C): 36.5 ml 19.06 ml/m AORTIC VALVE                    PULMONIC VALVE AV Area (Vmax):    2.06 cm     PV Vmax:          0.78 m/s AV Area (Vmean):   1.80 cm     PV Peak grad:     2.4 mmHg AV Area (VTI):     2.25 cm     PR End Diast Vel: 3.04 msec AV Vmax:           109.00 cm/s AV Vmean:          73.900 cm/s AV VTI:            0.249 m AV Peak Grad:      4.8 mmHg AV Mean Grad:      3.0 mmHg LVOT Vmax:         88.20 cm/s LVOT Vmean:        52.400 cm/s LVOT VTI:          0.220 m LVOT/AV VTI ratio: 0.88  AORTA Ao Root diam: 2.60 cm Ao Asc diam:  3.10 cm  MITRAL VALVE MV Area (PHT): 3.95 cm     SHUNTS MV Area VTI:   2.60 cm     Systemic VTI:  0.22 m MV Peak grad:  4.1 mmHg     Systemic Diam: 1.80 cm MV Mean grad:  2.0 mmHg MV Vmax:       1.01 m/s MV Vmean:      63.3 cm/s MV Decel Time: 192 msec MV E velocity: 74.50 cm/s MV A velocity: 100.00 cm/s MV E/A ratio:  0.74  Lennie Odor MD Electronically signed by Lennie Odor MD Signature Date/Time: 01/18/2024/1:47:16 PM    Final  ______________________________________________________________________________________________        Risk Assessment/Calculations:             Physical Exam:   VS:  BP 130/60   Pulse (!) 57   Ht 5\' 5"  (1.651 m)   Wt 186 lb (84.4 kg)   SpO2 96%   BMI 30.95 kg/m    Wt Readings from Last 3 Encounters:  01/26/24 186 lb (84.4 kg)  01/21/24 187 lb 12.8 oz (85.2 kg)  01/19/24 184 lb 4.8 oz (83.6 kg)    GEN: Well nourished, well developed in no acute distress NECK: No JVD; No carotid bruits CARDIAC: RRR, no murmurs, rubs, gallops RESPIRATORY:  Clear to auscultation without rales, wheezing or rhonchi  ABDOMEN: Soft, non-tender, non-distended EXTREMITIES:  No edema; No deformity   Right radial cath site free of any acute  signs of infection, small hematoma with ecchymosis.  ASSESSMENT AND PLAN: .    CAD status post DES to RCA x 2 Anginal equivalents were jaw pain/neck stiffness.  Other than RCA disease, cath demonstrating only mild nonobstructive CAD in the left system.  No further complaints. Plan for DAPT with aspirin and Brilinta for at least 1 year, then Brilinta monotherapy indefinitely.  Tolerating well and compliant.  Reports able to afford Continue with Toprol-XL 25 mg, rosuvastatin 10 mg, has as needed nitro Echo EF 60 to 65%, unable to assess RWMA No longer takes omeprazole but pantoprazole if needed would be the preferred option if they were ever switched to Plavix  Hypertension 110s-120s, keeps well-documented blood pressure cuff at home.  Well-controlled. Reported cough with lisinopril.  Intolerant to amlodipine and losartan (back pain, flared her  IBS).  Continue Toprol-XL as above.  Hyperlipidemia LDL 185, LP(a) 154 Reported possible myalgias in the past so started on low-dose rosuvastatin 10 mg, scheduled to see lipid clinic  Type 2 diabetes A1c 6.7%.  Continue with metformin 500 mg daily.  {The patient has an active order for outpatient cardiac rehabilitation.   Please indicate if the patient is ready to start. Do NOT delete this.  It will auto delete.  Refresh note, then sign.              Click here to document readiness and see contraindications.  :1}  Cardiac Rehabilitation Eligibility Assessment  The patient is ready to start cardiac rehabilitation from a cardiac standpoint.       Dispo: 6 months  Signed, Abagail Kitchens, PA-C

## 2024-01-26 NOTE — Patient Instructions (Signed)
 Medication Instructions:  NO CHANGES *If you need a refill on your cardiac medications before your next appointment, please call your pharmacy*   Lab Work: NO LABS If you have labs (blood work) drawn today and your tests are completely normal, you will receive your results only by: MyChart Message (if you have MyChart) OR A paper copy in the mail If you have any lab test that is abnormal or we need to change your treatment, we will call you to review the results.   Testing/Procedures: NO TESTING   Follow-Up: At [facility], you and your health needs are our priority.  As part of our continuing mission to provide you with exceptional heart care, we have created designated Provider Care Teams.  These Care Teams include your primary Cardiologist (physician) and Advanced Practice Providers (APPs -  Physician Assistants and Nurse Practitioners) who all work together to provide you with the care you need, when you need it.  Your next appointment:   6 month(s)  Provider:   Nicki Guadalajara, MD   Other Instructions

## 2024-01-27 ENCOUNTER — Telehealth (HOSPITAL_COMMUNITY): Payer: Self-pay

## 2024-01-27 NOTE — Telephone Encounter (Signed)
 Pt insurance is active and benefits verified through Medicare A/B. Co-pay $0.00, DED $257.00/$165.02 met, out of pocket $0.00/$0.00 met, co-insurance 20%. No pre-authorization required. Passport, 01/27/24 @ 3:48PM, REF#[id number]   How many CR sessions are covered? (36 visits for TCR, 72 visits for ICR)72 Is this a lifetime maximum or an annual maximum? Lifetime Has the member used any of these services to date? No Is there a time limit (weeks/months) on start of program and/or program completion? No   2ndary insurance is active and benefits verified through [place]. Co-pay $0.00, DED $0.00/$0.00 met, out of pocket $0.00/$0.00 met, co-insurance 0%. No pre-authorization required. Passport, 01/27/24 @ 3:51PM, REF#[id number]      Will contact patient to see if she is interested in the Cardiac Rehab Program. If interested, patient will need to complete follow up appt. Once completed, patient will be contacted for scheduling upon review by the RN Navigator.

## 2024-01-28 ENCOUNTER — Telehealth (HOSPITAL_COMMUNITY): Payer: Self-pay

## 2024-01-28 ENCOUNTER — Encounter (HOSPITAL_COMMUNITY): Payer: Self-pay

## 2024-01-28 NOTE — Telephone Encounter (Signed)
 Attempted to call patient in regards to Cardiac Rehab - LM on VM Mailed letter

## 2024-01-28 NOTE — Telephone Encounter (Signed)
 Pt returned CR phone call and stated she is interested,  patient will come in for orientation on 02/01/24 @ 10:30AM and will attend the 1:45PM exercise class.   Pensions consultant.

## 2024-02-01 ENCOUNTER — Encounter (HOSPITAL_COMMUNITY)
Admission: RE | Admit: 2024-02-01 | Discharge: 2024-02-01 | Disposition: A | Discharge status: Home / Self Care | Source: Ambulatory Visit | Attending: Cardiovascular Disease | Admitting: Cardiovascular Disease

## 2024-02-01 VITALS — BP 128/60 | Ht 65.25 in | Wt 187.8 lb

## 2024-02-01 DIAGNOSIS — Z48812 Encounter for surgical aftercare following surgery on the circulatory system: Secondary | ICD-10-CM | POA: Diagnosis not present

## 2024-02-01 DIAGNOSIS — Z955 Presence of coronary angioplasty implant and graft: Secondary | ICD-10-CM | POA: Diagnosis not present

## 2024-02-01 NOTE — Progress Notes (Signed)
 Cardiac Individual Treatment Plan  Patient Details  Name: Shelly [name] MRN: [id number] Date of Birth: 1946-03-20 Referring Provider:   Flowsheet Row INTENSIVE CARDIAC REHAB ORIENT from 02/01/2024 in [facility]  Referring Provider Nicki Guadalajara, MD       Initial Encounter Date:  Flowsheet Row INTENSIVE CARDIAC REHAB ORIENT from 02/01/2024 in [facility]  Date 02/01/24       Visit Diagnosis: 01/18/24 Status post coronary artery stent placement RCA  Patient's Home Medications on Admission:  Current Outpatient Medications:    acetaminophen (TYLENOL) 500 MG tablet, Take 500 mg by mouth every 6 (six) hours as needed for headache or mild pain., Disp: , Rfl:    aspirin EC 81 MG tablet, Take 1 tablet (81 mg total) by mouth daily. Swallow whole., Disp: 90 tablet, Rfl: 2   clidinium-chlordiazePOXIDE (LIBRAX) 5-2.5 MG capsule, TAKE 1 CAPSULE EVERY DAY AS NEEDED (Patient taking differently: Take 1 capsule by mouth daily as needed (bowel issue).), Disp: 90 capsule, Rfl: 0   denosumab (PROLIA) 60 MG/ML SOSY injection, Inject 60 mg into the skin every 6 (six) months. Last inj was in October, Disp: , Rfl:    loratadine (CLARITIN) 10 MG tablet, Take 10 mg by mouth daily., Disp: , Rfl:    metoprolol succinate (TOPROL-XL) 25 MG 24 hr tablet, Take 1 tablet (25 mg total) by mouth daily., Disp: 90 tablet, Rfl: 0   nitroGLYCERIN (NITROSTAT) 0.4 MG SL tablet, Place 1 tablet (0.4 mg total) under the tongue every 5 (five) minutes x 3 doses as needed for chest pain., Disp: 25 tablet, Rfl: 2   rosuvastatin (CRESTOR) 10 MG tablet, Take 1 tablet (10 mg total) by mouth daily., Disp: 90 tablet, Rfl: 0   ticagrelor (BRILINTA) 90 MG TABS tablet, Take 1 tablet (90 mg total) by mouth 2 (two) times daily., Disp: 180 tablet, Rfl: 2   calcium-vitamin D (OSCAL WITH D) 500-5 MG-MCG tablet, Take 1 tablet by  mouth daily. (Patient not taking: Reported on 01/26/2024), Disp: , Rfl:    metFORMIN (GLUCOPHAGE-XR) 500 MG 24 hr tablet, Take 1 tablet (500 mg total) by mouth daily with breakfast. (Patient not taking: Reported on 02/01/2024), Disp: 30 tablet, Rfl: 0  Past Medical History: Past Medical History:  Diagnosis Date   Allergy    Arthritis    wrist, shoulder    Cancer (HCC) 08/2019   left breast IDC   Diverticulitis    Endometrial polyp    Family history of breast cancer    GERD (gastroesophageal reflux disease)    occasional - diet controlled   Hx of adenomatous colonic polyps    Hyperlipidemia    IBS (irritable bowel syndrome)    Missed abortion    x 1 - resolved - no surgery   Osteoporosis    SVD (spontaneous vaginal delivery)    x 1   Transient vision disturbance 2019   was evaluated by ED, no findings   Vitamin D deficiency     Tobacco Use: Social History   Tobacco Use  Smoking Status Former   Current packs/day: 0.00   Average packs/day: 0.1 packs/day for 20.0 years (2.0 ttl pk-yrs)   Types: Cigarettes   Start date: 05/15/1987   Quit date: 05/15/2007   Years since quitting: 16.7  Smokeless Tobacco Never    Labs: Review Flowsheet  More data exists      Latest Ref  Rng & Units 04/06/2017 10/22/2017 08/05/2018 08/14/2020 01/18/2024  Labs for ITP Cardiac and Pulmonary Rehab  Cholestrol 0 - 200 mg/dL - - - - 478   LDL (calc) 0 - 99 mg/dL - - - - 295   HDL-C >62 mg/dL - - - - 50   Trlycerides <150 mg/dL - - - - 130   Hemoglobin A1c 4.8 - 5.6 % 6.2  6.3  6.1  6.6  6.7     Capillary Blood Glucose: Lab Results  Component Value Date   GLUCAP 152 (H) 01/19/2024   GLUCAP 129 (H) 01/18/2024   GLUCAP 98 01/18/2024   GLUCAP 117 (H) 01/18/2024   GLUCAP 134 (H) 01/18/2024     Exercise Target Goals: Exercise Program Goal: Individual exercise prescription set using results from initial 6 min walk test and THRR while considering  patient's activity barriers and safety.    Exercise Prescription Goal: Initial exercise prescription builds to 30-45 minutes a day of aerobic activity, 2-3 days per week.  Home exercise guidelines will be given to patient during program as part of exercise prescription that the participant will acknowledge.  Activity Barriers & Risk Stratification:  Activity Barriers & Cardiac Risk Stratification - 02/01/24 1158       Activity Barriers & Cardiac Risk Stratification   Activity Barriers Arthritis;Back Problems;Joint Problems;Deconditioning;Neck/Spine Problems;Balance Concerns    Cardiac Risk Stratification High             6 Minute Walk:  6 Minute Walk     Row Name 02/01/24 1130         6 Minute Walk   Phase Initial     Distance 1256 feet     Walk Time 6 minutes     # of Rest Breaks 0     MPH 2.38     METS 2.01     RPE 9     Perceived Dyspnea  0     VO2 Peak 7.1     Symptoms Yes (comment)     Comments chronic low back pain 3/10     Resting HR 62 bpm     Resting BP 128/60     Resting Oxygen Saturation  93 %     Exercise Oxygen Saturation  during 6 min walk 96 %     Max Ex. HR 76 bpm     Max Ex. BP 156/70     2 Minute Post BP 130/78              Oxygen Initial Assessment:   Oxygen Re-Evaluation:   Oxygen Discharge (Final Oxygen Re-Evaluation):   Initial Exercise Prescription:  Initial Exercise Prescription - 02/01/24 1200       Date of Initial Exercise RX and Referring Provider   Date 02/01/24    Referring Provider Nicki Guadalajara, MD    Expected Discharge Date 04/26/24      NuStep   Level 1    SPM 75    Minutes 30    METs 2      Prescription Details   Frequency (times per week) 3    Duration Progress to 30 minutes of continuous aerobic without signs/symptoms of physical distress      Intensity   THRR 40-80% of Max Heartrate 56-113    Ratings of Perceived Exertion 11-13    Perceived Dyspnea 0-4      Progression   Progression Continue progressive overload as per policy without  signs/symptoms or physical distress.  Resistance Training   Training Prescription Yes    Weight 2 lbs    Reps 10-15             Perform Capillary Blood Glucose checks as needed.  Exercise Prescription Changes:   Exercise Comments:   Exercise Goals and Review:   Exercise Goals     Row Name 02/01/24 1054             Exercise Goals   Increase Physical Activity Yes       Intervention Provide advice, education, support and counseling about physical activity/exercise needs.;Develop an individualized exercise prescription for aerobic and resistive training based on initial evaluation findings, risk stratification, comorbidities and participant's personal goals.       Expected Outcomes Short Term: Attend rehab on a regular basis to increase amount of physical activity.;Long Term: Add in home exercise to make exercise part of routine and to increase amount of physical activity.;Long Term: Exercising regularly at least 3-5 days a week.       Increase Strength and Stamina Yes       Intervention Provide advice, education, support and counseling about physical activity/exercise needs.;Develop an individualized exercise prescription for aerobic and resistive training based on initial evaluation findings, risk stratification, comorbidities and participant's personal goals.       Expected Outcomes Short Term: Increase workloads from initial exercise prescription for resistance, speed, and METs.;Short Term: Perform resistance training exercises routinely during rehab and add in resistance training at home;Long Term: Improve cardiorespiratory fitness, muscular endurance and strength as measured by increased METs and functional capacity ( )       Able to understand and use rate of perceived exertion (RPE) scale Yes       Intervention Provide education and explanation on how to use RPE scale       Expected Outcomes Short Term: Able to use RPE daily in rehab to express subjective intensity  level;Long Term:  Able to use RPE to guide intensity level when exercising independently       Knowledge and understanding of Target Heart Rate Range (THRR) Yes       Intervention Provide education and explanation of THRR including how the numbers were predicted and where they are located for reference       Expected Outcomes Short Term: Able to state/look up THRR;Long Term: Able to use THRR to govern intensity when exercising independently;Short Term: Able to use daily as guideline for intensity in rehab       Understanding of Exercise Prescription Yes       Intervention Provide education, explanation, and written materials on patient's individual exercise prescription       Expected Outcomes Short Term: Able to explain program exercise prescription;Long Term: Able to explain home exercise prescription to exercise independently                Exercise Goals Re-Evaluation :   Discharge Exercise Prescription (Final Exercise Prescription Changes):   Nutrition:  Target Goals: Understanding of nutrition guidelines, daily intake of sodium 1500mg , cholesterol 200mg , calories 30% from fat and 7% or less from saturated fats, daily to have 5 or more servings of fruits and vegetables.  Biometrics:  Pre Biometrics - 02/01/24 1048       Pre Biometrics   Waist Circumference 43.5 inches    Hip Circumference 43 inches    Waist to Hip Ratio 1.01 %    Triceps Skinfold 23 mm    % Body Fat 43.4 %  Grip Strength 18 kg    Flexibility --   Not performed 4/10 low back pain today   Single Leg Stand 11.5 seconds              Nutrition Therapy Plan and Nutrition Goals:   Nutrition Assessments:  MEDIFICTS Score Key: >=70 Need to make dietary changes  40-70 Heart Healthy Diet <= 40 Therapeutic Level Cholesterol Diet    Picture Your Plate Scores: <40 Unhealthy dietary pattern with much room for improvement. 41-50 Dietary pattern unlikely to meet recommendations for good health and  room for improvement. 51-60 More healthful dietary pattern, with some room for improvement.  >60 Healthy dietary pattern, although there may be some specific behaviors that could be improved.    Nutrition Goals Re-Evaluation:   Nutrition Goals Re-Evaluation:   Nutrition Goals Discharge (Final Nutrition Goals Re-Evaluation):   Psychosocial: Target Goals: Acknowledge presence or absence of significant depression and/or stress, maximize coping skills, provide positive support system. Participant is able to verbalize types and ability to use techniques and skills needed for reducing stress and depression.  Initial Review & Psychosocial Screening:  Initial Psych Review & Screening - 02/01/24 1205       Initial Review   Current issues with Current Sleep Concerns      Family Dynamics   Good Support System? Yes   Pt has spouse at home for support     Barriers   Psychosocial barriers to participate in program There are no identifiable barriers or psychosocial needs.      Screening Interventions   Interventions Encouraged to exercise             Quality of Life Scores:  Quality of Life - 02/01/24 1328       Quality of Life   Select Quality of Life      Quality of Life Scores   Health/Function Pre 25.33 %    Socioeconomic Pre 21.79 %    Psych/Spiritual Pre 26.29 %    Family Pre 28.3 %    GLOBAL Pre 25.24 %            Scores of 19 and below usually indicate a poorer quality of life in these areas.  A difference of  2-3 points is a clinically meaningful difference.  A difference of 2-3 points in the total score of the Quality of Life Index has been associated with significant improvement in overall quality of life, self-image, physical symptoms, and general health in studies assessing change in quality of life.  PHQ-9: Review Flowsheet  More data exists      02/01/2024 08/23/2023 11/24/2022 08/17/2022 01/12/2022  Depression screen PHQ 2/9  Decreased Interest 0 0 0 0 0   Down, Depressed, Hopeless 0 0 0 0 0  PHQ - 2 Score 0 0 0 0 0  Altered sleeping 2 - - - -  Tired, decreased energy 1 - - - -  Change in appetite 0 - - - -  Feeling bad or failure about yourself  0 - - - -  Trouble concentrating 0 - - - -  Moving slowly or fidgety/restless 0 - - - -  Suicidal thoughts 0 - - - -  PHQ-9 Score 3 - - - -  Difficult doing work/chores Somewhat difficult - - - -   Interpretation of Total Score  Total Score Depression Severity:  1-4 = Minimal depression, 5-9 = Mild depression, 10-14 = Moderate depression, 15-19 = Moderately severe depression, 20-27 = Severe depression  Psychosocial Evaluation and Intervention:   Psychosocial Re-Evaluation:   Psychosocial Discharge (Final Psychosocial Re-Evaluation):   Vocational Rehabilitation: Provide vocational rehab assistance to qualifying candidates.   Vocational Rehab Evaluation & Intervention:  Vocational Rehab - 02/01/24 1206       Initial Vocational Rehab Evaluation & Intervention   Assessment shows need for Vocational Rehabilitation No   Pt is retired            Education: Education Goals: Education classes will be provided on a weekly basis, covering required topics. Participant will state understanding/return demonstration of topics presented.     Core Videos: Exercise    Move It!  Clinical staff conducted group or individual video education with verbal and written material and guidebook.  Patient learns the recommended Pritikin exercise program. Exercise with the goal of living a long, healthy life. Some of the health benefits of exercise include controlled diabetes, healthier blood pressure levels, improved cholesterol levels, improved heart and lung capacity, improved sleep, and better body composition. Everyone should speak with their doctor before starting or changing an exercise routine.  Biomechanical Limitations Clinical staff conducted group or individual video education with verbal  and written material and guidebook.  Patient learns how biomechanical limitations can impact exercise and how we can mitigate and possibly overcome limitations to have an impactful and balanced exercise routine.  Body Composition Clinical staff conducted group or individual video education with verbal and written material and guidebook.  Patient learns that body composition (ratio of muscle mass to fat mass) is a key component to assessing overall fitness, rather than body weight alone. Increased fat mass, especially visceral belly fat, can put [place] at increased risk for metabolic syndrome, type 2 diabetes, heart disease, and even death. It is recommended to combine diet and exercise (cardiovascular and resistance training) to improve your body composition. Seek guidance from your physician and exercise physiologist before implementing an exercise routine.  Exercise Action Plan Clinical staff conducted group or individual video education with verbal and written material and guidebook.  Patient learns the recommended strategies to achieve and enjoy long-term exercise adherence, including variety, self-motivation, self-efficacy, and positive decision making. Benefits of exercise include fitness, good health, weight management, more energy, better sleep, less stress, and overall well-being.  Medical   Heart Disease Risk Reduction Clinical staff conducted group or individual video education with verbal and written material and guidebook.  Patient learns our heart is our most vital organ as it circulates oxygen, nutrients, white blood cells, and hormones throughout the entire body, and carries waste away. Data supports a plant-based eating plan like the Pritikin Program for its effectiveness in slowing progression of and reversing heart disease. The video provides a number of recommendations to address heart disease.   Metabolic Syndrome and Belly Fat  Clinical staff conducted group or individual video  education with verbal and written material and guidebook.  Patient learns what metabolic syndrome is, how it leads to heart disease, and how one can reverse it and keep it from coming back. You have metabolic syndrome if you have 3 of the following 5 criteria: abdominal obesity, high blood pressure, high triglycerides, low HDL cholesterol, and high blood sugar.  Hypertension and Heart Disease Clinical staff conducted group or individual video education with verbal and written material and guidebook.  Patient learns that high blood pressure, or hypertension, is very common in the [place]. Hypertension is largely due to excessive salt intake, but other important risk factors include being overweight, physical inactivity,  drinking too much alcohol, smoking, and not eating enough potassium from fruits and vegetables. High blood pressure is a leading risk factor for heart attack, stroke, congestive heart failure, dementia, kidney failure, and premature death. Long-term effects of excessive salt intake include stiffening of the arteries and thickening of heart muscle and organ damage. Recommendations include ways to reduce hypertension and the risk of heart disease.  Diseases of Our Time - Focusing on Diabetes Clinical staff conducted group or individual video education with verbal and written material and guidebook.  Patient learns why the best way to stop diseases of our time is prevention, through food and other lifestyle changes. Medicine (such as prescription pills and surgeries) is often only a Band-Aid on the problem, not a long-term solution. Most common diseases of our time include obesity, type 2 diabetes, hypertension, heart disease, and cancer. The Pritikin Program is recommended and has been proven to help reduce, reverse, and/or prevent the damaging effects of metabolic syndrome.  Nutrition   Overview of the Pritikin Eating Plan  Clinical staff conducted group or individual video education  with verbal and written material and guidebook.  Patient learns about the Pritikin Eating Plan for disease risk reduction. The Pritikin Eating Plan emphasizes a wide variety of unrefined, minimally-processed carbohydrates, like fruits, vegetables, whole grains, and legumes. Go, Caution, and Stop food choices are explained. Plant-based and lean animal proteins are emphasized. Rationale provided for low sodium intake for blood pressure control, low added sugars for blood sugar stabilization, and low added fats and oils for coronary artery disease risk reduction and weight management.  Calorie Density  Clinical staff conducted group or individual video education with verbal and written material and guidebook.  Patient learns about calorie density and how it impacts the Pritikin Eating Plan. Knowing the characteristics of the food you choose will help you decide whether those foods will lead to weight gain or weight loss, and whether you want to consume more or less of them. Weight loss is usually a side effect of the Pritikin Eating Plan because of its focus on low calorie-dense foods.  Label Reading  Clinical staff conducted group or individual video education with verbal and written material and guidebook.  Patient learns about the Pritikin recommended label reading guidelines and corresponding recommendations regarding calorie density, added sugars, sodium content, and whole grains.  Dining Out - Part 1  Clinical staff conducted group or individual video education with verbal and written material and guidebook.  Patient learns that restaurant meals can be sabotaging because they can be so high in calories, fat, sodium, and/or sugar. Patient learns recommended strategies on how to positively address this and avoid unhealthy pitfalls.  Facts on Fats  Clinical staff conducted group or individual video education with verbal and written material and guidebook.  Patient learns that lifestyle modifications  can be just as effective, if not more so, as many medications for lowering your risk of heart disease. A Pritikin lifestyle can help to reduce your risk of inflammation and atherosclerosis (cholesterol build-up, or plaque, in the artery walls). Lifestyle interventions such as dietary choices and physical activity address the cause of atherosclerosis. A review of the types of fats and their impact on blood cholesterol levels, along with dietary recommendations to reduce fat intake is also included.  Nutrition Action Plan  Clinical staff conducted group or individual video education with verbal and written material and guidebook.  Patient learns how to incorporate Pritikin recommendations into their lifestyle. Recommendations include planning and keeping  personal health goals in mind as an important part of their success.  Healthy Mind-Set    Healthy Minds, Bodies, Hearts  Clinical staff conducted group or individual video education with verbal and written material and guidebook.  Patient learns how to identify when they are stressed. Video will discuss the impact of that stress, as well as the many benefits of stress management. Patient will also be introduced to stress management techniques. The way we think, act, and feel has an impact on our hearts.  How Our Thoughts Can Heal Our Hearts  Clinical staff conducted group or individual video education with verbal and written material and guidebook.  Patient learns that negative thoughts can cause depression and anxiety. This can result in negative lifestyle behavior and serious health problems. Cognitive behavioral therapy is an effective method to help control our thoughts in order to change and improve our emotional outlook.  Additional Videos:  Exercise    Improving Performance  Clinical staff conducted group or individual video education with verbal and written material and guidebook.  Patient learns to use a non-linear approach by alternating  intensity levels and lengths of time spent exercising to help burn more calories and lose more body fat. Cardiovascular exercise helps improve heart health, metabolism, hormonal balance, blood sugar control, and recovery from fatigue. Resistance training improves strength, endurance, balance, coordination, reaction time, metabolism, and muscle mass. Flexibility exercise improves circulation, posture, and balance. Seek guidance from your physician and exercise physiologist before implementing an exercise routine and learn your capabilities and proper form for all exercise.  Introduction to Yoga  Clinical staff conducted group or individual video education with verbal and written material and guidebook.  Patient learns about yoga, a discipline of the coming together of mind, breath, and body. The benefits of yoga include improved flexibility, improved range of motion, better posture and core strength, increased lung function, weight loss, and positive self-image. Yoga's heart health benefits include lowered blood pressure, healthier heart rate, decreased cholesterol and triglyceride levels, improved immune function, and reduced stress. Seek guidance from your physician and exercise physiologist before implementing an exercise routine and learn your capabilities and proper form for all exercise.  Medical   Aging: Enhancing Your Quality of Life  Clinical staff conducted group or individual video education with verbal and written material and guidebook.  Patient learns key strategies and recommendations to stay in good physical health and enhance quality of life, such as prevention strategies, having an advocate, securing a Health Care Proxy and Power of Attorney, and keeping a list of medications and system for tracking them. It also discusses how to avoid risk for bone loss.  Biology of Weight Control  Clinical staff conducted group or individual video education with verbal and written material and  guidebook.  Patient learns that weight gain occurs because we consume more calories than we burn (eating more, moving less). Even if your body weight is normal, you may have higher ratios of fat compared to muscle mass. Too much body fat puts you at increased risk for cardiovascular disease, heart attack, stroke, type 2 diabetes, and obesity-related cancers. In addition to exercise, following the Pritikin Eating Plan can help reduce your risk.  Decoding Lab Results  Clinical staff conducted group or individual video education with verbal and written material and guidebook.  Patient learns that lab test reflects one measurement whose values change over time and are influenced by many factors, including medication, stress, sleep, exercise, food, hydration, pre-existing medical conditions, and more.  It is recommended to use the knowledge from this video to become more involved with your lab results and evaluate your numbers to speak with your doctor.   Diseases of Our Time - Overview  Clinical staff conducted group or individual video education with verbal and written material and guidebook.  Patient learns that according to the CDC, 50% to 70% of chronic diseases (such as obesity, type 2 diabetes, elevated lipids, hypertension, and heart disease) are avoidable through lifestyle improvements including healthier food choices, listening to satiety cues, and increased physical activity.  Sleep Disorders Clinical staff conducted group or individual video education with verbal and written material and guidebook.  Patient learns how good quality and duration of sleep are important to overall health and well-being. Patient also learns about sleep disorders and how they impact health along with recommendations to address them, including discussing with a physician.  Nutrition  Dining Out - Part 2 Clinical staff conducted group or individual video education with verbal and written material and guidebook.   Patient learns how to plan ahead and communicate in order to maximize their dining experience in a healthy and nutritious manner. Included are recommended food choices based on the type of restaurant the patient is visiting.   Fueling a Banker conducted group or individual video education with verbal and written material and guidebook.  There is a strong connection between our food choices and our health. Diseases like obesity and type 2 diabetes are very prevalent and are in large-part due to lifestyle choices. The Pritikin Eating Plan provides plenty of food and hunger-curbing satisfaction. It is easy to follow, affordable, and helps reduce health risks.  Menu Workshop  Clinical staff conducted group or individual video education with verbal and written material and guidebook.  Patient learns that restaurant meals can sabotage health goals because they are often packed with calories, fat, sodium, and sugar. Recommendations include strategies to plan ahead and to communicate with the manager, chef, or server to help order a healthier meal.  Planning Your Eating Strategy  Clinical staff conducted group or individual video education with verbal and written material and guidebook.  Patient learns about the Pritikin Eating Plan and its benefit of reducing the risk of disease. The Pritikin Eating Plan does not focus on calories. Instead, it emphasizes high-quality, nutrient-rich foods. By knowing the characteristics of the foods, we choose, we can determine their calorie density and make informed decisions.  Targeting Your Nutrition Priorities  Clinical staff conducted group or individual video education with verbal and written material and guidebook.  Patient learns that lifestyle habits have a tremendous impact on disease risk and progression. This video provides eating and physical activity recommendations based on your personal health goals, such as reducing LDL cholesterol,  losing weight, preventing or controlling type 2 diabetes, and reducing high blood pressure.  Vitamins and Minerals  Clinical staff conducted group or individual video education with verbal and written material and guidebook.  Patient learns different ways to obtain key vitamins and minerals, including through a recommended healthy diet. It is important to discuss all supplements you take with your doctor.   Healthy Mind-Set    Smoking Cessation  Clinical staff conducted group or individual video education with verbal and written material and guidebook.  Patient learns that cigarette smoking and tobacco addiction pose a serious health risk which affects millions of people. Stopping smoking will significantly reduce the risk of heart disease, lung disease, and many forms of cancer. Recommended  strategies for quitting are covered, including working with your doctor to develop a successful plan.  Culinary   Becoming a Set designer conducted group or individual video education with verbal and written material and guidebook.  Patient learns that cooking at home can be healthy, cost-effective, quick, and puts them in control. Keys to cooking healthy recipes will include looking at your recipe, assessing your equipment needs, planning ahead, making it simple, choosing cost-effective seasonal ingredients, and limiting the use of added fats, salts, and sugars.  Cooking - Breakfast and Snacks  Clinical staff conducted group or individual video education with verbal and written material and guidebook.  Patient learns how important breakfast is to satiety and nutrition through the entire day. Recommendations include key foods to eat during breakfast to help stabilize blood sugar levels and to prevent overeating at meals later in the day. Planning ahead is also a key component.  Cooking - Educational psychologist conducted group or individual video education with verbal and written  material and guidebook.  Patient learns eating strategies to improve overall health, including an approach to cook more at home. Recommendations include thinking of animal protein as a side on your plate rather than center stage and focusing instead on lower calorie dense options like vegetables, fruits, whole grains, and plant-based proteins, such as beans. Making sauces in large quantities to freeze for later and leaving the skin on your vegetables are also recommended to maximize your experience.  Cooking - Healthy Salads and Dressing Clinical staff conducted group or individual video education with verbal and written material and guidebook.  Patient learns that vegetables, fruits, whole grains, and legumes are the foundations of the Pritikin Eating Plan. Recommendations include how to incorporate each of these in flavorful and healthy salads, and how to create homemade salad dressings. Proper handling of ingredients is also covered. Cooking - Soups and [employer] - Soups and Desserts Clinical staff conducted group or individual video education with verbal and written material and guidebook.  Patient learns that Pritikin soups and desserts make for easy, nutritious, and delicious snacks and meal components that are low in sodium, fat, sugar, and calorie density, while high in vitamins, minerals, and filling fiber. Recommendations include simple and healthy ideas for soups and desserts.   Overview     The Pritikin Solution Program Overview Clinical staff conducted group or individual video education with verbal and written material and guidebook.  Patient learns that the results of the Pritikin Program have been documented in more than 100 articles published in peer-reviewed journals, and the benefits include reducing risk factors for (and, in some cases, even reversing) high cholesterol, high blood pressure, type 2 diabetes, obesity, and more! An overview of the three key pillars of the  Pritikin Program will be covered: eating well, doing regular exercise, and having a healthy mind-set.  WORKSHOPS  Exercise: Exercise Basics: Building Your Action Plan Clinical staff led group instruction and group discussion with PowerPoint presentation and patient guidebook. To enhance the learning environment the use of posters, models and videos may be added. At the conclusion of this workshop, patients will comprehend the difference between physical activity and exercise, as well as the benefits of incorporating both, into their routine. Patients will understand the FITT (Frequency, Intensity, Time, and Type) principle and how to use it to build an exercise action plan. In addition, safety concerns and other considerations for exercise and cardiac rehab will be addressed by the presenter.  The purpose of this lesson is to promote a comprehensive and effective weekly exercise routine in order to improve patients' overall level of fitness.   Managing Heart Disease: Your Path to a Healthier Heart Clinical staff led group instruction and group discussion with PowerPoint presentation and patient guidebook. To enhance the learning environment the use of posters, models and videos may be added.At the conclusion of this workshop, patients will understand the anatomy and physiology of the heart. Additionally, they will understand how Pritikin's three pillars impact the risk factors, the progression, and the management of heart disease.  The purpose of this lesson is to provide a high-level overview of the heart, heart disease, and how the Pritikin lifestyle positively impacts risk factors.  Exercise Biomechanics Clinical staff led group instruction and group discussion with PowerPoint presentation and patient guidebook. To enhance the learning environment the use of posters, models and videos may be added. Patients will learn how the structural parts of their bodies function and how these functions  impact their daily activities, movement, and exercise. Patients will learn how to promote a neutral spine, learn how to manage pain, and identify ways to improve their physical movement in order to promote healthy living. The purpose of this lesson is to expose patients to common physical limitations that impact physical activity. Participants will learn practical ways to adapt and manage aches and pains, and to minimize their effect on regular exercise. Patients will learn how to maintain good posture while sitting, walking, and lifting.  Balance Training and Fall Prevention  Clinical staff led group instruction and group discussion with PowerPoint presentation and patient guidebook. To enhance the learning environment the use of posters, models and videos may be added. At the conclusion of this workshop, patients will understand the importance of their sensorimotor skills (vision, proprioception, and the vestibular system) in maintaining their ability to balance as they age. Patients will apply a variety of balancing exercises that are appropriate for their current level of function. Patients will understand the common causes for poor balance, possible solutions to these problems, and ways to modify their physical environment in order to minimize their fall risk. The purpose of this lesson is to teach patients about the importance of maintaining balance as they age and ways to minimize their risk of falling.  WORKSHOPS   Nutrition:  Fueling a Ship broker led group instruction and group discussion with PowerPoint presentation and patient guidebook. To enhance the learning environment the use of posters, models and videos may be added. Patients will review the foundational principles of the Pritikin Eating Plan and understand what constitutes a serving size in each of the food groups. Patients will also learn Pritikin-friendly foods that are better choices when away from home and  review make-ahead meal and snack options. Calorie density will be reviewed and applied to three nutrition priorities: weight maintenance, weight loss, and weight gain. The purpose of this lesson is to reinforce (in a group setting) the key concepts around what patients are recommended to eat and how to apply these guidelines when away from home by planning and selecting Pritikin-friendly options. Patients will understand how calorie density may be adjusted for different weight management goals.  Mindful Eating  Clinical staff led group instruction and group discussion with PowerPoint presentation and patient guidebook. To enhance the learning environment the use of posters, models and videos may be added. Patients will briefly review the concepts of the Pritikin Eating Plan and the importance of low-calorie dense  foods. The concept of mindful eating will be introduced as well as the importance of paying attention to internal hunger signals. Triggers for non-hunger eating and techniques for dealing with triggers will be explored. The purpose of this lesson is to provide patients with the opportunity to review the basic principles of the Pritikin Eating Plan, discuss the value of eating mindfully and how to measure internal cues of hunger and fullness using the Hunger Scale. Patients will also discuss reasons for non-hunger eating and learn strategies to use for controlling emotional eating.  Targeting Your Nutrition Priorities Clinical staff led group instruction and group discussion with PowerPoint presentation and patient guidebook. To enhance the learning environment the use of posters, models and videos may be added. Patients will learn how to determine their genetic susceptibility to disease by reviewing their family history. Patients will gain insight into the importance of diet as part of an overall healthy lifestyle in mitigating the impact of genetics and other environmental insults. The purpose of  this lesson is to provide patients with the opportunity to assess their personal nutrition priorities by looking at their family history, their own health history and current risk factors. Patients will also be able to discuss ways of prioritizing and modifying the Pritikin Eating Plan for their highest risk areas  Menu  Clinical staff led group instruction and group discussion with PowerPoint presentation and patient guidebook. To enhance the learning environment the use of posters, models and videos may be added. Using menus brought in from [employer], or printed from [employer], patients will apply the Pritikin dining out guidelines that were presented in the [employer] video. Patients will also be able to practice these guidelines in a variety of provided scenarios. The purpose of this lesson is to provide patients with the opportunity to practice hands-on learning of the Pritikin Dining Out guidelines with actual menus and practice scenarios.  Label Reading Clinical staff led group instruction and group discussion with PowerPoint presentation and patient guidebook. To enhance the learning environment the use of posters, models and videos may be added. Patients will review and discuss the Pritikin label reading guidelines presented in Pritikin's Label Reading Educational series video. Using fool labels brought in from local grocery stores and markets, patients will apply the label reading guidelines and determine if the packaged food meet the Pritikin guidelines. The purpose of this lesson is to provide patients with the opportunity to review, discuss, and practice hands-on learning of the Pritikin Label Reading guidelines with actual packaged food labels. Cooking School  Pritikin's [employer] are designed to teach patients ways to prepare quick, simple, and affordable recipes at home. The importance of nutrition's role in chronic disease risk reduction is  reflected in its emphasis in the overall Pritikin program. By learning how to prepare essential core Pritikin Eating Plan recipes, patients will increase control over what they eat; be able to customize the flavor of foods without the use of added salt, sugar, or fat; and improve the quality of the food they consume. By learning a set of core recipes which are easily assembled, quickly prepared, and affordable, patients are more likely to prepare more healthy foods at home. These workshops focus on convenient breakfasts, simple entres, side dishes, and desserts which can be prepared with minimal effort and are consistent with nutrition recommendations for cardiovascular risk reduction. Cooking [employer] are taught by a Armed forces logistics/support/administrative officer (RD) who has been trained by the [employer]  team. The chef or RD has a clear understanding of the importance of minimizing - if not completely eliminating - added fat, sugar, and sodium in recipes. Throughout the series of Cooking School Workshop sessions, patients will learn about healthy ingredients and efficient methods of cooking to build confidence in their capability to prepare    Cooking School weekly topics:  Adding Flavor- Sodium-Free  Fast and Healthy Breakfasts  Powerhouse Plant-Based Proteins  Satisfying Salads and Dressings  Simple Sides and Sauces  International Cuisine-Spotlight on the [employer] Zones  Delicious Desserts  Savory Soups  [employer] - Meals in a Astronomer Appetizers and Snacks  Comforting Weekend Breakfasts  One-Pot Wonders   Fast Evening Meals  Landscape architect Your Pritikin Plate  WORKSHOPS   Healthy Mindset (Psychosocial):  Focused Goals, Sustainable Changes Clinical staff led group instruction and group discussion with PowerPoint presentation and patient guidebook. To enhance the learning environment the use of posters, models and videos may be added. Patients will be able to  apply effective goal setting strategies to establish at least one personal goal, and then take consistent, meaningful action toward that goal. They will learn to identify common barriers to achieving personal goals and develop strategies to overcome them. Patients will also gain an understanding of how our mind-set can impact our ability to achieve goals and the importance of cultivating a positive and growth-oriented mind-set. The purpose of this lesson is to provide patients with a deeper understanding of how to set and achieve personal goals, as well as the tools and strategies needed to overcome common obstacles which may arise along the way.  From Head to Heart: The Power of a Healthy Outlook  Clinical staff led group instruction and group discussion with PowerPoint presentation and patient guidebook. To enhance the learning environment the use of posters, models and videos may be added. Patients will be able to recognize and describe the impact of emotions and mood on physical health. They will discover the importance of self-care and explore self-care practices which may work for them. Patients will also learn how to utilize the 4 C's to cultivate a healthier outlook and better manage stress and challenges. The purpose of this lesson is to demonstrate to patients how a healthy outlook is an essential part of maintaining good health, especially as they continue their cardiac rehab journey.  Healthy Sleep for a Healthy Heart Clinical staff led group instruction and group discussion with PowerPoint presentation and patient guidebook. To enhance the learning environment the use of posters, models and videos may be added. At the conclusion of this workshop, patients will be able to demonstrate knowledge of the importance of sleep to overall health, well-being, and quality of life. They will understand the symptoms of, and treatments for, common sleep disorders. Patients will also be able to identify daytime  and nighttime behaviors which impact sleep, and they will be able to apply these tools to help manage sleep-related challenges. The purpose of this lesson is to provide patients with a general overview of sleep and outline the importance of quality sleep. Patients will learn about a few of the most common sleep disorders. Patients will also be introduced to the concept of "sleep hygiene," and discover ways to self-manage certain sleeping problems through simple daily behavior changes. Finally, the workshop will motivate patients by clarifying the links between quality sleep and their goals of heart-healthy living.   Recognizing and Reducing Stress Clinical staff led group instruction and group discussion  with PowerPoint presentation and patient guidebook. To enhance the learning environment the use of posters, models and videos may be added. At the conclusion of this workshop, patients will be able to understand the types of stress reactions, differentiate between acute and chronic stress, and recognize the impact that chronic stress has on their health. They will also be able to apply different coping mechanisms, such as reframing negative self-talk. Patients will have the opportunity to practice a variety of stress management techniques, such as deep abdominal breathing, progressive muscle relaxation, and/or guided imagery.  The purpose of this lesson is to educate patients on the role of stress in their lives and to provide healthy techniques for coping with it.  Learning Barriers/Preferences:  Learning Barriers/Preferences - 02/01/24 1206       Learning Barriers/Preferences   Learning Barriers Sight   wears reading glasses   Learning Preferences Audio;Verbal Instruction;Computer/Internet;Video;Written Material;Group Instruction;Individual Instruction;Pictoral;Skilled Demonstration             Education Topics:  Knowledge Questionnaire Score:  Knowledge Questionnaire Score - 02/01/24 1329        Knowledge Questionnaire Score   Pre Score 24/24             Core Components/Risk Factors/Patient Goals at Admission:  Personal Goals and Risk Factors at Admission - 02/01/24 1206       Core Components/Risk Factors/Patient Goals on Admission    Weight Management Yes;Obesity;Weight Loss    Intervention Weight Management: Develop a combined nutrition and exercise program designed to reach desired caloric intake, while maintaining appropriate intake of nutrient and fiber, sodium and fats, and appropriate energy expenditure required for the weight goal.;Weight Management: Provide education and appropriate resources to help participant work on and attain dietary goals.;Weight Management/Obesity: Establish reasonable short term and long term weight goals.    Admit Weight 187 lb 14.4 oz (85.2 kg)    Goal Weight: Long Term 176 lb (79.8 kg)    Expected Outcomes Short Term: Continue to assess and modify interventions until short term weight is achieved;Long Term: Adherence to nutrition and physical activity/exercise program aimed toward attainment of established weight goal;Weight Loss: Understanding of general recommendations for a balanced deficit meal plan, which promotes 1-2 lb weight loss per week and includes a negative energy balance of [phone] kcal/d;Understanding recommendations for meals to include 15-35% energy as protein, 25-35% energy from fat, 35-60% energy from carbohydrates, less than 200mg  of dietary cholesterol, 20-35 gm of total fiber daily;Understanding of distribution of calorie intake throughout the day with the consumption of 4-5 meals/snacks    Hypertension Yes    Intervention Provide education on lifestyle modifcations including regular physical activity/exercise, weight management, moderate sodium restriction and increased consumption of fresh fruit, vegetables, and low fat dairy, alcohol moderation, and smoking cessation.;Monitor prescription use compliance.    Expected  Outcomes Short Term: Continued assessment and intervention until BP is < 140/34mm HG in hypertensive participants. < 130/73mm HG in hypertensive participants with diabetes, heart failure or chronic kidney disease.;Long Term: Maintenance of blood pressure at goal levels.    Lipids Yes    Intervention Provide education and support for participant on nutrition & aerobic/resistive exercise along with prescribed medications to achieve LDL 70mg , HDL >40mg .    Expected Outcomes Short Term: Participant states understanding of desired cholesterol values and is compliant with medications prescribed. Participant is following exercise prescription and nutrition guidelines.;Long Term: Cholesterol controlled with medications as prescribed, with individualized exercise RX and with personalized nutrition plan. Value goals: LDL <  70mg , HDL > 40 mg.             Core Components/Risk Factors/Patient Goals Review:    Core Components/Risk Factors/Patient Goals at Discharge (Final Review):    ITP Comments:  ITP Comments     Row Name 02/01/24 1012           ITP Comments Dr. Armanda Magic medical director. Introduction to pritikin education/intensive cardiac rehab. Initial orientation packet reviewed with patient.                Comments: Participant attended orientation for the cardiac rehabilitation program on  02/01/2024  to perform initial intake and exercise walk test. Patient introduced to the Pritikin Program education and orientation packet was reviewed. Completed 6-minute walk test, measurements, initial ITP, and exercise prescription. Vital signs stable. Telemetry-normal sinus rhythm, asymptomatic.   Service time was from 10:10 to 12:23.

## 2024-02-01 NOTE — Progress Notes (Signed)
 Cardiac Rehab Medication Review   Does the patient  feel that his/her medications are working for him/her?  yes  Has the patient been experiencing any side effects to the medications prescribed?  no  Does the patient measure his/her own blood pressure or blood glucose at home?  yes   Does the patient have any problems obtaining medications due to transportation or finances?   No  Understanding of regimen: good Understanding of indications: good Potential of compliance: excellent    Comments: Pt has no questions regarding her medications. Pt checks blood pressures at home several times per week.    Lorin Picket 02/01/2024 10:44 AM

## 2024-02-02 ENCOUNTER — Encounter: Payer: Self-pay | Admitting: Family Medicine

## 2024-02-02 ENCOUNTER — Ambulatory Visit: Admitting: Family Medicine

## 2024-02-02 VITALS — BP 138/60 | HR 65 | Temp 98.0°F | Wt 184.2 lb

## 2024-02-02 DIAGNOSIS — I10 Essential (primary) hypertension: Secondary | ICD-10-CM | POA: Diagnosis not present

## 2024-02-02 DIAGNOSIS — E118 Type 2 diabetes mellitus with unspecified complications: Secondary | ICD-10-CM | POA: Diagnosis not present

## 2024-02-02 DIAGNOSIS — I2510 Atherosclerotic heart disease of native coronary artery without angina pectoris: Secondary | ICD-10-CM

## 2024-02-02 DIAGNOSIS — E782 Mixed hyperlipidemia: Secondary | ICD-10-CM

## 2024-02-02 NOTE — Progress Notes (Signed)
 Established Patient Office Visit  Subjective   Patient ID: Shelly [name], female    DOB: [birth year]  Age: 78 y.o. MRN: [id number]  Chief Complaint  Patient presents with   Medical Management of Chronic Issues    HPI   Shelly [name] is seen to discuss several exercise and dietary issues.  Refer to recent note for details.  She had recent non-ST elevation MI with cath showing tandem RCA lesions which were stented successfully.  She is now on dual antiplatelet therapy with aspirin and Brilinta.  She is taking rosuvastatin.  She went for cardiac rehab orientation yesterday and is excited to get that started.  She has already made some substantial lifestyle changes.  She and her husband are tackling this together.  They have been eating more fish and she is trying to eat less processed and snack foods.  She is eating lots of vegetables.  Trying to eat lower glycemic foods.  Has already walked up to 3 miles couple days.  She has already lost couple of pounds.  She hopes to lose about 20 more.  No further chest pains.  Does have history of type 2 diabetes with most recent A1c 6.7%.  She had been taking extended release metformin 500 mg once daily was stopped this on her own just days ago.  She states she fell like she was having some GI side effects.  Her hypertension has been very challenging to manage with prior reported side effects to multiple medications previously.  She is currently taking low-dose Toprol-XL.  Home blood pressures continue to range 130s to 140s systolic and diastolics usually well-controlled.  Past Medical History:  Diagnosis Date   Allergy    Arthritis    wrist, shoulder    Cancer (HCC) 08/2019   left breast IDC   Diverticulitis    Endometrial polyp    Family history of breast cancer    GERD (gastroesophageal reflux disease)    occasional - diet controlled   Hx of adenomatous colonic polyps    Hyperlipidemia    IBS (irritable bowel syndrome)    Missed abortion     x 1 - resolved - no surgery   Osteoporosis    SVD (spontaneous vaginal delivery)    x 1   Transient vision disturbance 2019   was evaluated by ED, no findings   Vitamin D deficiency    Past Surgical History:  Procedure Laterality Date   BREAST EXCISIONAL BIOPSY Right 08/2019   Benign, lesion    BREAST LUMPECTOMY Left 09/2019   BREAST LUMPECTOMY WITH RADIOACTIVE SEED AND SENTINEL LYMPH NODE BIOPSY Left 10/06/2019   Procedure: LEFT BREAST LUMPECTOMY  X2 WITH RADIOACTIVE SEED X2  AND SENTINEL LYMPH NODE BIOPSY;  Surgeon: Griselda Miner, MD;  Location: [facility];  Service: General;  Laterality: Left;   BREAST LUMPECTOMY WITH RADIOACTIVE SEED LOCALIZATION Right 10/06/2019   Procedure: RIGHT BREAST LUMPECTOMY WITH RADIOACTIVE SEED LOCALIZATION;  Surgeon: Griselda Miner, MD;  Location: [facility];  Service: General;  Laterality: Right;   BRONCHIAL BIOPSY  12/27/2020   Procedure: BRONCHIAL BIOPSIES;  Surgeon: Josephine Igo, DO;  Location: [facility];  Service: Pulmonary;;   BRONCHIAL BRUSHINGS  12/27/2020   Procedure: BRONCHIAL BRUSHINGS;  Surgeon: Josephine Igo, DO;  Location: [facility];  Service: Pulmonary;;   BRONCHIAL NEEDLE ASPIRATION BIOPSY  12/27/2020   Procedure: BRONCHIAL NEEDLE ASPIRATION BIOPSIES;  Surgeon: Josephine Igo, DO;  Location: [facility];  Service:  Pulmonary;;   BRONCHIAL WASHINGS  12/27/2020   Procedure: BRONCHIAL WASHINGS;  Surgeon: Josephine Igo, DO;  Location: [facility];  Service: Pulmonary;;   COLONOSCOPY     CORONARY ANGIOGRAPHY N/A 01/18/2024   Procedure: CORONARY ANGIOGRAPHY;  Surgeon: [name], MD;  Location: [facility];  Service: Cardiovascular;  Laterality: N/A;   CORONARY IMAGING/OCT N/A 01/18/2024   Procedure: CORONARY IMAGING/OCT;  Surgeon: [name], MD;  Location: [facility];  Service: Cardiovascular;  Laterality: N/A;   CORONARY STENT INTERVENTION N/A 01/18/2024   Procedure:  CORONARY STENT INTERVENTION;  Surgeon: [name], MD;  Location: [facility];  Service: Cardiovascular;  Laterality: N/A;   DIAGNOSTIC LAPAROSCOPY     cysts   DILATATION & CURETTAGE/HYSTEROSCOPY WITH TRUECLEAR N/A 10/31/2013   Procedure: DILATATION & CURETTAGE/HYSTEROSCOPY WITH TRUCLEAR;  Surgeon: Zelphia Cairo, MD;  Location: [facility];  Service: Gynecology;  Laterality: N/A;   DILATION AND CURETTAGE OF UTERUS     MASS EXCISION Left 11/06/2019   Procedure: EXCISION NIPPLE AND AREOLA  MARGIN LEFT BREAST;  Surgeon: Griselda Miner, MD;  Location: [facility];  Service: General;  Laterality: Left;   TONSILLECTOMY  1952   VIDEO BRONCHOSCOPY WITH ENDOBRONCHIAL NAVIGATION Bilateral 12/27/2020   Procedure: VIDEO BRONCHOSCOPY WITH ENDOBRONCHIAL NAVIGATION;  Surgeon: Josephine Igo, DO;  Location: [facility];  Service: Pulmonary;  Laterality: Bilateral;   VIDEO BRONCHOSCOPY WITH ENDOBRONCHIAL ULTRASOUND Bilateral 12/27/2020   Procedure: VIDEO BRONCHOSCOPY WITH ENDOBRONCHIAL ULTRASOUND;  Surgeon: Josephine Igo, DO;  Location: [facility];  Service: Pulmonary;  Laterality: Bilateral;   WISDOM TOOTH EXTRACTION      reports that she quit smoking about 16 years ago. Her smoking use included cigarettes. She started smoking about 36 years ago. She has a 2 pack-year smoking history. She has never used smokeless tobacco. She reports current alcohol use of about 14.0 standard drinks of alcohol per week. She reports that she does not use drugs. family history includes Brain cancer in her paternal uncle; Breast cancer (age of onset: 88) in her cousin; Breast cancer (age of onset: 55) in her maternal aunt and maternal aunt; Cancer in her maternal grandfather, maternal uncle, and another family member; Colon polyps in her mother; Depression in her brother; Diabetes in her mother; Heart disease in an other family member; Hyperlipidemia in her father and mother; Hypertension in her mother and  another family member; Leukemia (age of onset: 34) in her mother; Lung cancer in her father and maternal grandfather; Throat cancer in her paternal uncle; Uterine cancer in her paternal grandmother. Allergies  Allergen Reactions   Taxotere [Docetaxel] Swelling   Abraxane [Paclitaxel Protein-Bound Part] Other (See Comments)    3 days after receiving she had one episode of very high BP and HR. This happened again 2 days later during the night.    Amlodipine     Caused IBS flare-up. Returned after stopping and restarting med   [employer] Calcium] Itching    GI upset   Lipitor [Atorvastatin Calcium] Itching   Lisinopril Cough   Losartan     Diffuse arthralgias and back pain. Returned after stopping and restarting med   Nsaids Other (See Comments)    GI upset   Pravastatin Itching    GI upset   Sulfonamide Derivatives Rash    Hives, itiching    Review of Systems  Constitutional:  Negative for malaise/fatigue.  Eyes:  Negative for blurred vision.  Respiratory:  Negative  for cough and shortness of breath.   Cardiovascular:  Negative for chest pain and leg swelling.  Gastrointestinal:  Negative for abdominal pain.  Neurological:  Negative for dizziness, weakness and headaches.      Objective:     BP 138/60 (BP Location: Right Arm, Cuff Size: Normal)   Pulse 65   Temp 98 F (36.7 C) (Oral)   Wt 184 lb 3.2 oz (83.6 kg)   SpO2 95%   BMI 30.42 kg/m  BP Readings from Last 3 Encounters:  02/02/24 138/60  02/01/24 128/60  01/26/24 130/60   Wt Readings from Last 3 Encounters:  02/02/24 184 lb 3.2 oz (83.6 kg)  02/01/24 187 lb 13.3 oz (85.2 kg)  01/26/24 186 lb (84.4 kg)      Physical Exam Vitals reviewed.  Constitutional:      Appearance: She is well-developed.  Eyes:     Pupils: Pupils are equal, round, and reactive to light.  Neck:     Thyroid: No thyromegaly.     Vascular: No JVD.  Cardiovascular:     Rate and Rhythm: Normal rate and regular rhythm.      Heart sounds:     No gallop.  Pulmonary:     Effort: Pulmonary effort is normal. No respiratory distress.     Breath sounds: Normal breath sounds. No wheezing or rales.  Musculoskeletal:     Cervical back: Neck supple.     Right lower leg: No edema.     Left lower leg: No edema.  Neurological:     Mental Status: She is alert.      No results found for any visits on 02/02/24.  Last CBC Lab Results  Component Value Date   WBC 8.0 01/18/2024   HGB 14.1 01/18/2024   HCT 42.5 01/18/2024   MCV 87.8 01/18/2024   MCH 29.1 01/18/2024   RDW 14.1 01/18/2024   PLT 284 01/18/2024   Last metabolic panel Lab Results  Component Value Date   GLUCOSE 192 (H) 01/19/2024   NA 139 01/19/2024   K 4.0 01/19/2024   CL 109 01/19/2024   CO2 21 (L) 01/19/2024   BUN 14 01/19/2024   CREATININE 1.00 01/19/2024   GFRNONAA 57 (L) 01/19/2024   CALCIUM 8.8 (L) 01/19/2024   PROT 6.9 09/23/2023   ALBUMIN 4.2 09/23/2023   BILITOT 0.4 09/23/2023   ALKPHOS 63 09/23/2023   AST 15 09/23/2023   ALT 14 09/23/2023   ANIONGAP 9 01/19/2024   Last lipids Lab Results  Component Value Date   CHOL 266 (H) 01/18/2024   HDL 50 01/18/2024   LDLCALC 185 (H) 01/18/2024   LDLDIRECT 215.9 06/04/2011   TRIG 155 (H) 01/18/2024   CHOLHDL 5.3 01/18/2024   Last hemoglobin A1c Lab Results  Component Value Date   HGBA1C 6.7 (H) 01/18/2024      The 10-year ASCVD risk score (Arnett DK, et al., 2019) is: 55.5%    Assessment & Plan:   #1 CAD with recent NSTEMI with recent stenting of tandem RCA lesions.  No further chest pain.  She has already made some positive lifestyle changes and walks 3 miles past couple days.  She has made some positive dietary changes and already lost couple pounds.  We strongly recommend that she complete and follow through with her cardiac rehab program.  She is tolerating low-dose rosuvastatin and she is reminded to set up future labs sometime in April for fasting lipid and hepatic  panel  #2 hypertension.  History  of intolerance with multiple medications.  Currently blood pressure improving with just low-dose metoprolol.  We have advised her that with adequate weight loss she may be able to manage this without additional medication.  We also discussed the value of regular aerobic activity in lowering blood pressure even independent of weight loss.  #3 history of type 2 diabetes.  Last A1c 6.7%.  Patient has decided she does not want to take metformin.  Recheck A1c at follow-up in June.  If not to goal at that point consider possible SGLT2 medication.  With adequate weight loss suspect she can manage this without any oral diabetic medications   Evelena Peat, MD

## 2024-02-04 ENCOUNTER — Telehealth: Payer: Self-pay

## 2024-02-04 NOTE — Telephone Encounter (Signed)
 Copied from CRM [id number]. Topic: General - Other >> Feb 04, 2024  2:00 PM Aletta Edouard wrote: Reason for CRM: patient is needing a call back regarding her IBS she would like a call back

## 2024-02-04 NOTE — Telephone Encounter (Signed)
 I spoke with the patient and she reported flare up for IBS. Patient reported she has been taking Libra 5-2.5 mg with little relief. Patient inquired if any of her current medications may be exacerbating her IBS symptoms. Patient also inquired if it is possible for her to discontinue some of her heart medication due to her IBS.

## 2024-02-07 ENCOUNTER — Telehealth: Payer: Self-pay | Admitting: Family Medicine

## 2024-02-07 ENCOUNTER — Encounter (HOSPITAL_COMMUNITY)
Admission: RE | Admit: 2024-02-07 | Discharge: 2024-02-07 | Disposition: A | Discharge status: Home / Self Care | Source: Ambulatory Visit | Attending: Cardiovascular Disease | Admitting: Cardiovascular Disease

## 2024-02-07 DIAGNOSIS — Z955 Presence of coronary angioplasty implant and graft: Secondary | ICD-10-CM | POA: Diagnosis not present

## 2024-02-07 DIAGNOSIS — Z48812 Encounter for surgical aftercare following surgery on the circulatory system: Secondary | ICD-10-CM | POA: Diagnosis not present

## 2024-02-07 LAB — GLUCOSE, CAPILLARY: Glucose-Capillary: 98 mg/dL (ref 70–99)

## 2024-02-07 NOTE — Telephone Encounter (Signed)
 Schedule after 4/17, $0

## 2024-02-07 NOTE — Telephone Encounter (Signed)
 Pt has been sch for 03-07-2024

## 2024-02-07 NOTE — Telephone Encounter (Signed)
 Patient informed of the message below and voiced understanding

## 2024-02-07 NOTE — Telephone Encounter (Signed)
 Copied from CRM [id number]. Topic: General - Other >> Feb 07, 2024  8:54 AM Melissa C wrote: Reason for CRM: patient would like a call back to get prolia injection scheduled. She stated she gets it every 6 months but it has to be approved so she wants to ensure it is approved and scheduled.

## 2024-02-07 NOTE — Progress Notes (Signed)
 Daily Session Note  Patient Details  Name: Shelly [name] MRN: [id number] Date of Birth: 05/29/1946 Referring Provider:   Flowsheet Row INTENSIVE CARDIAC REHAB ORIENT from 02/01/2024 in [facility]  Referring Provider Nicki Guadalajara, MD       Encounter Date: 02/07/2024  Check In:  Session Check In - 02/07/24 1609       Check-In   Supervising physician immediately available to respond to emergencies [facility] MD immediately available    Physician(s) Eligha Bridegroom, NP    Location [facility]    Staff Present Lorin Picket, MS, ACSM-CEP, CCRP, Exercise Physiologist;Johnny Hale Bogus, MS, Exercise Physiologist;Casey Thedore Mins, RN, BSN    Virtual Visit No    Medication changes reported     No    Fall or balance concerns reported    No    Tobacco Cessation No Change    Warm-up and Cool-down Performed as group-led instruction    Resistance Training Performed Yes    VAD Patient? No    PAD/SET Patient? No      Pain Assessment   Currently in Pain? No/denies    Pain Score 0-No pain    Multiple Pain Sites No             Capillary Blood Glucose: Results for orders placed or performed during the hospital encounter of 02/07/24 (from the past 24 hours)  Glucose, capillary     Status: None   Collection Time: 02/07/24  3:42 PM  Result Value Ref Range   Glucose-Capillary 98 70 - 99 mg/dL     Exercise Prescription Changes - 02/07/24 1600       Response to Exercise   Blood Pressure (Admit) 124/70    Blood Pressure (Exercise) 140/56    Blood Pressure (Exit) 104/72    Heart Rate (Admit) 60 bpm    Heart Rate (Exercise) 72 bpm    Heart Rate (Exit) 66 bpm    Rating of Perceived Exertion (Exercise) 11    Perceived Dyspnea (Exercise) 0    Symptoms 6/10 Right arm pain    Comments Pt first day in CRP2 program    Duration Progress to 30 minutes of  aerobic without signs/symptoms of physical  distress      Progression   Progression Continue to progress workloads to maintain intensity without signs/symptoms of physical distress.    Average METs 1.5      Resistance Training   Training Prescription Yes    Weight 2    Reps 10-15      Interval Training   Interval Training No      NuStep   Level 1    SPM 55    Minutes 19    METs 1.5             Social History   Tobacco Use  Smoking Status Former   Current packs/day: 0.00   Average packs/day: 0.1 packs/day for 20.0 years (2.0 ttl pk-yrs)   Types: Cigarettes   Start date: 05/15/1987   Quit date: 05/15/2007   Years since quitting: 16.7  Smokeless Tobacco Never    Goals Met:  Exercise tolerated well No report of concerns or symptoms today Strength training completed today  Goals Unmet:  Not Applicable  Comments: Pt started cardiac rehab today.  Pt tolerated light exercise without difficulty. VSS, telemetry-Sinus Rhythm, asymptomatic.  Medication list reconciled. Pt denies barriers to medicaiton compliance.  PSYCHOSOCIAL ASSESSMENT:  PHQ-3. Pt  exhibits positive coping skills, hopeful outlook with supportive family. No psychosocial needs identified at this time, no psychosocial interventions necessary.    Pt enjoys shopping and spending time outdoors.   Pt oriented to exercise equipment and routine. Dennie Bible said she was nervous about starting exercise but felt better after exercise was over   Understanding verbalized.Thayer Headings RN BSN    Dr. Armanda Magic is Medical Director for Cardiac Rehab at [facility].

## 2024-02-08 LAB — GLUCOSE, CAPILLARY: Glucose-Capillary: 109 mg/dL — ABNORMAL HIGH (ref 70–99)

## 2024-02-09 ENCOUNTER — Encounter (HOSPITAL_COMMUNITY)
Admission: RE | Admit: 2024-02-09 | Discharge: 2024-02-09 | Disposition: A | Discharge status: Home / Self Care | Source: Ambulatory Visit | Attending: Cardiovascular Disease | Admitting: Cardiovascular Disease

## 2024-02-09 DIAGNOSIS — Z955 Presence of coronary angioplasty implant and graft: Secondary | ICD-10-CM | POA: Diagnosis not present

## 2024-02-09 DIAGNOSIS — Z48812 Encounter for surgical aftercare following surgery on the circulatory system: Secondary | ICD-10-CM | POA: Diagnosis not present

## 2024-02-11 ENCOUNTER — Encounter (HOSPITAL_COMMUNITY)
Admission: RE | Admit: 2024-02-11 | Discharge: 2024-02-11 | Disposition: A | Discharge status: Home / Self Care | Source: Ambulatory Visit | Attending: Cardiovascular Disease | Admitting: Cardiovascular Disease

## 2024-02-11 DIAGNOSIS — Z955 Presence of coronary angioplasty implant and graft: Secondary | ICD-10-CM | POA: Diagnosis not present

## 2024-02-11 DIAGNOSIS — Z48812 Encounter for surgical aftercare following surgery on the circulatory system: Secondary | ICD-10-CM | POA: Diagnosis not present

## 2024-02-11 NOTE — Progress Notes (Signed)
 Cardiac Individual Treatment Plan  Patient Details  Name: Shelly [name] MRN: [id number] Date of Birth: 11-19-1945 Referring Provider:   Flowsheet Row INTENSIVE CARDIAC REHAB ORIENT from 02/01/2024 in [facility] for [facility], Vascular, & Lung Health  Referring Provider Nicki Guadalajara, MD       Initial Encounter Date:  Flowsheet Row INTENSIVE CARDIAC REHAB ORIENT from 02/01/2024 in [facility] for Heart, Vascular, & Lung Health  Date 02/01/24       Visit Diagnosis: 01/18/24 Status post coronary artery stent placement RCA  Patient's Home Medications on Admission:  Current Outpatient Medications:    acetaminophen (TYLENOL) 500 MG tablet, Take 500 mg by mouth every 6 (six) hours as needed for headache or mild pain., Disp: , Rfl:    aspirin EC 81 MG tablet, Take 1 tablet (81 mg total) by mouth daily. Swallow whole., Disp: 90 tablet, Rfl: 2   calcium-vitamin D (OSCAL WITH D) 500-5 MG-MCG tablet, Take 1 tablet by mouth daily. (Patient not taking: Reported on 01/26/2024), Disp: , Rfl:    clidinium-chlordiazePOXIDE (LIBRAX) 5-2.5 MG capsule, TAKE 1 CAPSULE EVERY DAY AS NEEDED (Patient taking differently: Take 1 capsule by mouth daily as needed (bowel issue).), Disp: 90 capsule, Rfl: 0   denosumab (PROLIA) 60 MG/ML SOSY injection, Inject 60 mg into the skin every 6 (six) months. Last inj was in October, Disp: , Rfl:    loratadine (CLARITIN) 10 MG tablet, Take 10 mg by mouth daily., Disp: , Rfl:    metFORMIN (GLUCOPHAGE-XR) 500 MG 24 hr tablet, Take 1 tablet (500 mg total) by mouth daily with breakfast. (Patient not taking: Reported on 02/01/2024), Disp: 30 tablet, Rfl: 0   metoprolol succinate (TOPROL-XL) 25 MG 24 hr tablet, Take 1 tablet (25 mg total) by mouth daily., Disp: 90 tablet, Rfl: 0   nitroGLYCERIN (NITROSTAT) 0.4 MG SL tablet, Place 1 tablet (0.4 mg total) under the tongue every 5 (five) minutes x 3 doses as needed for chest pain., Disp: 25  tablet, Rfl: 2   rosuvastatin (CRESTOR) 10 MG tablet, Take 1 tablet (10 mg total) by mouth daily., Disp: 90 tablet, Rfl: 0   ticagrelor (BRILINTA) 90 MG TABS tablet, Take 1 tablet (90 mg total) by mouth 2 (two) times daily., Disp: 180 tablet, Rfl: 2  Past Medical History: Past Medical History:  Diagnosis Date   Allergy    Arthritis    wrist, shoulder    Cancer (HCC) 08/2019   left breast IDC   Diverticulitis    Endometrial polyp    Family history of breast cancer    GERD (gastroesophageal reflux disease)    occasional - diet controlled   Hx of adenomatous colonic polyps    Hyperlipidemia    IBS (irritable bowel syndrome)    Missed abortion    x 1 - resolved - no surgery   Osteoporosis    SVD (spontaneous vaginal delivery)    x 1   Transient vision disturbance 2019   was evaluated by ED, no findings   Vitamin D deficiency     Tobacco Use: Social History   Tobacco Use  Smoking Status Former   Current packs/day: 0.00   Average packs/day: 0.1 packs/day for 20.0 years (2.0 ttl pk-yrs)   Types: Cigarettes   Start date: 05/15/1987   Quit date: 05/15/2007   Years since quitting: 16.7  Smokeless Tobacco Never    Labs: Review Flowsheet  More data exists      Latest Ref  Rng & Units 04/06/2017 10/22/2017 08/05/2018 08/14/2020 01/18/2024  Labs for ITP Cardiac and Pulmonary Rehab  Cholestrol 0 - 200 mg/dL - - - - 161   LDL (calc) 0 - 99 mg/dL - - - - 096   HDL-C >04 mg/dL - - - - 50   Trlycerides <150 mg/dL - - - - 540   Hemoglobin A1c 4.8 - 5.6 % 6.2  6.3  6.1  6.6  6.7     Capillary Blood Glucose: Lab Results  Component Value Date   GLUCAP 98 02/07/2024   GLUCAP 109 (H) 02/07/2024   GLUCAP 152 (H) 01/19/2024   GLUCAP 129 (H) 01/18/2024   GLUCAP 98 01/18/2024     Exercise Target Goals: Exercise Program Goal: Individual exercise prescription set using results from initial 6 min walk test and THRR while considering  patient's activity barriers and safety.   Exercise  Prescription Goal: Initial exercise prescription builds to 30-45 minutes a day of aerobic activity, 2-3 days per week.  Home exercise guidelines will be given to patient during program as part of exercise prescription that the participant will acknowledge.  Activity Barriers & Risk Stratification:  Activity Barriers & Cardiac Risk Stratification - 02/01/24 1158       Activity Barriers & Cardiac Risk Stratification   Activity Barriers Arthritis;Back Problems;Joint Problems;Deconditioning;Neck/Spine Problems;Balance Concerns    Cardiac Risk Stratification High             6 Minute Walk:  6 Minute Walk     Row Name 02/01/24 1130         6 Minute Walk   Phase Initial     Distance 1256 feet     Walk Time 6 minutes     # of Rest Breaks 0     MPH 2.38     METS 2.01     RPE 9     Perceived Dyspnea  0     VO2 Peak 7.1     Symptoms Yes (comment)     Comments chronic low back pain 3/10     Resting HR 62 bpm     Resting BP 128/60     Resting Oxygen Saturation  93 %     Exercise Oxygen Saturation  during 6 min walk 96 %     Max Ex. HR 76 bpm     Max Ex. BP 156/70     2 Minute Post BP 130/78              Oxygen Initial Assessment:   Oxygen Re-Evaluation:   Oxygen Discharge (Final Oxygen Re-Evaluation):   Initial Exercise Prescription:  Initial Exercise Prescription - 02/01/24 1200       Date of Initial Exercise RX and Referring Provider   Date 02/01/24    Referring Provider Nicki Guadalajara, MD    Expected Discharge Date 04/26/24      NuStep   Level 1    SPM 75    Minutes 30    METs 2      Prescription Details   Frequency (times per week) 3    Duration Progress to 30 minutes of continuous aerobic without signs/symptoms of physical distress      Intensity   THRR 40-80% of Max Heartrate 56-113    Ratings of Perceived Exertion 11-13    Perceived Dyspnea 0-4      Progression   Progression Continue progressive overload as per policy without signs/symptoms  or physical distress.      Resistance  Training   Training Prescription Yes    Weight 2 lbs    Reps 10-15             Perform Capillary Blood Glucose checks as needed.  Exercise Prescription Changes:   Exercise Prescription Changes     Row Name 02/07/24 1600             Response to Exercise   Blood Pressure (Admit) 124/70       Blood Pressure (Exercise) 140/56       Blood Pressure (Exit) 104/72       Heart Rate (Admit) 60 bpm       Heart Rate (Exercise) 72 bpm       Heart Rate (Exit) 66 bpm       Rating of Perceived Exertion (Exercise) 11       Perceived Dyspnea (Exercise) 0       Symptoms 6/10 Right arm pain       Comments Pt first day in CRP2 program       Duration Progress to 30 minutes of  aerobic without signs/symptoms of physical distress         Progression   Progression Continue to progress workloads to maintain intensity without signs/symptoms of physical distress.       Average METs 1.5         Resistance Training   Training Prescription Yes       Weight 2       Reps 10-15         Interval Training   Interval Training No         NuStep   Level 1       SPM 55       Minutes 19       METs 1.5                Exercise Comments:   Exercise Goals and Review:   Exercise Goals     Row Name 02/01/24 1054             Exercise Goals   Increase Physical Activity Yes       Intervention Provide advice, education, support and counseling about physical activity/exercise needs.;Develop an individualized exercise prescription for aerobic and resistive training based on initial evaluation findings, risk stratification, comorbidities and participant's personal goals.       Expected Outcomes Short Term: Attend rehab on a regular basis to increase amount of physical activity.;Long Term: Add in home exercise to make exercise part of routine and to increase amount of physical activity.;Long Term: Exercising regularly at least 3-5 days a week.        Increase Strength and Stamina Yes       Intervention Provide advice, education, support and counseling about physical activity/exercise needs.;Develop an individualized exercise prescription for aerobic and resistive training based on initial evaluation findings, risk stratification, comorbidities and participant's personal goals.       Expected Outcomes Short Term: Increase workloads from initial exercise prescription for resistance, speed, and METs.;Short Term: Perform resistance training exercises routinely during rehab and add in resistance training at home;Long Term: Improve cardiorespiratory fitness, muscular endurance and strength as measured by increased METs and functional capacity ( )       Able to understand and use rate of perceived exertion (RPE) scale Yes       Intervention Provide education and explanation on how to use RPE scale       Expected Outcomes Short Term: Able to  use RPE daily in rehab to express subjective intensity level;Long Term:  Able to use RPE to guide intensity level when exercising independently       Knowledge and understanding of Target Heart Rate Range (THRR) Yes       Intervention Provide education and explanation of THRR including how the numbers were predicted and where they are located for reference       Expected Outcomes Short Term: Able to state/look up THRR;Long Term: Able to use THRR to govern intensity when exercising independently;Short Term: Able to use daily as guideline for intensity in rehab       Understanding of Exercise Prescription Yes       Intervention Provide education, explanation, and written materials on patient's individual exercise prescription       Expected Outcomes Short Term: Able to explain program exercise prescription;Long Term: Able to explain home exercise prescription to exercise independently                Exercise Goals Re-Evaluation :  Exercise Goals Re-Evaluation     Row Name 02/07/24 1649              Exercise Goal Re-Evaluation   Exercise Goals Review Increase Physical Activity;Understanding of Exercise Prescription;Increase Strength and Stamina;Knowledge and understanding of Target Heart Rate Range (THRR);Able to understand and use rate of perceived exertion (RPE) scale       Comments Pt first day in CRP2 program. Pt tolerated Nustep for 19 min and stopped due to 6/10 R arm pain, no other s/sx reported. Pt avg METs 1.5. Pt is learning her RPE scale, THRR, and ExRx.       Expected Outcomes Will continue to progress workloads as tolerated without s/sx.                Discharge Exercise Prescription (Final Exercise Prescription Changes):  Exercise Prescription Changes - 02/07/24 1600       Response to Exercise   Blood Pressure (Admit) 124/70    Blood Pressure (Exercise) 140/56    Blood Pressure (Exit) 104/72    Heart Rate (Admit) 60 bpm    Heart Rate (Exercise) 72 bpm    Heart Rate (Exit) 66 bpm    Rating of Perceived Exertion (Exercise) 11    Perceived Dyspnea (Exercise) 0    Symptoms 6/10 Right arm pain    Comments Pt first day in CRP2 program    Duration Progress to 30 minutes of  aerobic without signs/symptoms of physical distress      Progression   Progression Continue to progress workloads to maintain intensity without signs/symptoms of physical distress.    Average METs 1.5      Resistance Training   Training Prescription Yes    Weight 2    Reps 10-15      Interval Training   Interval Training No      NuStep   Level 1    SPM 55    Minutes 19    METs 1.5             Nutrition:  Target Goals: Understanding of nutrition guidelines, daily intake of sodium 1500mg , cholesterol 200mg , calories 30% from fat and 7% or less from saturated fats, daily to have 5 or more servings of fruits and vegetables.  Biometrics:  Pre Biometrics - 02/01/24 1048       Pre Biometrics   Waist Circumference 43.5 inches    Hip Circumference 43 inches    Waist to Hip Ratio  1.01 %    Triceps Skinfold 23 mm    % Body Fat 43.4 %    Grip Strength 18 kg    Flexibility --   Not performed 4/10 low back pain today   Single Leg Stand 11.5 seconds              Nutrition Therapy Plan and Nutrition Goals:  Nutrition Therapy & Goals - 02/07/24 1600       Nutrition Therapy   Diet Heart Healthy Diet    Drug/Food Interactions Statins/Certain Fruits      Personal Nutrition Goals   Nutrition Goal Patient to identify strategies for reducing cardiovascular risk by attending the [facility] education and nutrition series weekly.    Personal Goal #2 Patient to improve diet quality by using the plate method as a guide for meal planning to include lean protein/plant protein, fruits, vegetables, whole grains, nonfat dairy as part of a well-balanced diet.    Comments Dennie Bible has medical history of CAD, HTN, DM2,s/p coronary artery stent placement. Dennie Bible reports that she has started making some dietary changes including decreased saturated fat intake and increased omega 3 rich fish intake. She reports lengthy history of IBS and is apprehensive to attending the [facility] nutrition classes. LDL remains elevated; she is scheduled to see the lipid clinic. A1c is <7%. Patient will benefit from participation in intensive cardiac rehab for nutrition, exercise, and lifestyle modification.      Intervention Plan   Intervention Prescribe, educate and counsel regarding individualized specific dietary modifications aiming towards targeted core components such as weight, hypertension, lipid management, diabetes, heart failure and other comorbidities.;Nutrition handout(s) given to patient.    Expected Outcomes Short Term Goal: Understand basic principles of dietary content, such as calories, fat, sodium, cholesterol and nutrients.;Long Term Goal: Adherence to prescribed nutrition plan.             Nutrition Assessments:  Nutrition Assessments - 02/07/24 1604       Rate Your Plate Scores   Pre  Score 64            MEDIFICTS Score Key: >=70 Need to make dietary changes  40-70 Heart Healthy Diet <= 40 Therapeutic Level Cholesterol Diet   Flowsheet Row INTENSIVE CARDIAC REHAB from 02/07/2024 in [facility] for Heart, Vascular, & Lung Health  Picture Your Plate Total Score on Admission 64      Picture Your Plate Scores: <41 Unhealthy dietary pattern with much room for improvement. 41-50 Dietary pattern unlikely to meet recommendations for good health and room for improvement. 51-60 More healthful dietary pattern, with some room for improvement.  >60 Healthy dietary pattern, although there may be some specific behaviors that could be improved.    Nutrition Goals Re-Evaluation:  Nutrition Goals Re-Evaluation     Row Name 02/07/24 1600             Goals   Current Weight 187 lb 13.3 oz (85.2 kg)       Comment A1c 6.7, Lpa 154, cholesterol 266, triglycerides 155, LDL 185       Expected Outcome Pat has medical history of CAD, HTN, DM2,s/p coronary artery stent placement. Dennie Bible reports that she has started making some dietary changes including decreased saturated fat intake and increased omega 3 rich fish intake. She reports lengthy history of IBS and is apprehensive to attending the [facility] nutrition classes. LDL remains elevated; she is scheduled to see the lipid clinic. A1c is <7%. Patient will benefit from  participation in intensive cardiac rehab for nutrition, exercise, and lifestyle modification.                Nutrition Goals Re-Evaluation:  Nutrition Goals Re-Evaluation     Row Name 02/07/24 1600             Goals   Current Weight 187 lb 13.3 oz (85.2 kg)       Comment A1c 6.7, Lpa 154, cholesterol 266, triglycerides 155, LDL 185       Expected Outcome Pat has medical history of CAD, HTN, DM2,s/p coronary artery stent placement. Dennie Bible reports that she has started making some dietary changes including decreased saturated fat intake and  increased omega 3 rich fish intake. She reports lengthy history of IBS and is apprehensive to attending the [facility] nutrition classes. LDL remains elevated; she is scheduled to see the lipid clinic. A1c is <7%. Patient will benefit from participation in intensive cardiac rehab for nutrition, exercise, and lifestyle modification.                Nutrition Goals Discharge (Final Nutrition Goals Re-Evaluation):  Nutrition Goals Re-Evaluation - 02/07/24 1600       Goals   Current Weight 187 lb 13.3 oz (85.2 kg)    Comment A1c 6.7, Lpa 154, cholesterol 266, triglycerides 155, LDL 185    Expected Outcome Pat has medical history of CAD, HTN, DM2,s/p coronary artery stent placement. Dennie Bible reports that she has started making some dietary changes including decreased saturated fat intake and increased omega 3 rich fish intake. She reports lengthy history of IBS and is apprehensive to attending the [facility] nutrition classes. LDL remains elevated; she is scheduled to see the lipid clinic. A1c is <7%. Patient will benefit from participation in intensive cardiac rehab for nutrition, exercise, and lifestyle modification.             Psychosocial: Target Goals: Acknowledge presence or absence of significant depression and/or stress, maximize coping skills, provide positive support system. Participant is able to verbalize types and ability to use techniques and skills needed for reducing stress and depression.  Initial Review & Psychosocial Screening:  Initial Psych Review & Screening - 02/01/24 1205       Initial Review   Current issues with Current Sleep Concerns      Family Dynamics   Good Support System? Yes   Pt has spouse at home for support     Barriers   Psychosocial barriers to participate in program There are no identifiable barriers or psychosocial needs.      Screening Interventions   Interventions Encouraged to exercise             Quality of Life Scores:  Quality of Life -  02/01/24 1328       Quality of Life   Select Quality of Life      Quality of Life Scores   Health/Function Pre 25.33 %    Socioeconomic Pre 21.79 %    Psych/Spiritual Pre 26.29 %    Family Pre 28.3 %    GLOBAL Pre 25.24 %            Scores of 19 and below usually indicate a poorer quality of life in these areas.  A difference of  2-3 points is a clinically meaningful difference.  A difference of 2-3 points in the total score of the Quality of Life Index has been associated with significant improvement in overall quality of life, self-image, physical symptoms, and general  health in studies assessing change in quality of life.  PHQ-9: Review Flowsheet  More data exists      02/01/2024 08/23/2023 11/24/2022 08/17/2022 01/12/2022  Depression screen PHQ 2/9  Decreased Interest 0 0 0 0 0  Down, Depressed, Hopeless 0 0 0 0 0  PHQ - 2 Score 0 0 0 0 0  Altered sleeping 2 - - - -  Tired, decreased energy 1 - - - -  Change in appetite 0 - - - -  Feeling bad or failure about yourself  0 - - - -  Trouble concentrating 0 - - - -  Moving slowly or fidgety/restless 0 - - - -  Suicidal thoughts 0 - - - -  PHQ-9 Score 3 - - - -  Difficult doing work/chores Somewhat difficult - - - -   Interpretation of Total Score  Total Score Depression Severity:  1-4 = Minimal depression, 5-9 = Mild depression, 10-14 = Moderate depression, 15-19 = Moderately severe depression, 20-27 = Severe depression   Psychosocial Evaluation and Intervention:   Psychosocial Re-Evaluation:  Psychosocial Re-Evaluation     Row Name 02/07/24 1638             Psychosocial Re-Evaluation   Current issues with Current Sleep Concerns;Current Anxiety/Panic       Comments Dennie Bible said she was nervous about starting exercise but felt better after exercise was over. Will discuss PHQ 9 in the upcoming week       Expected Outcomes Dennie Bible will have decreased or controlled anxiety and better sleepipng habits upon completion of cardiac  rehab.       Interventions Stress management education;Relaxation education;Encouraged to attend Cardiac Rehabilitation for the exercise       Continue Psychosocial Services  Follow up required by staff                Psychosocial Discharge (Final Psychosocial Re-Evaluation):  Psychosocial Re-Evaluation - 02/07/24 1638       Psychosocial Re-Evaluation   Current issues with Current Sleep Concerns;Current Anxiety/Panic    Comments Dennie Bible said she was nervous about starting exercise but felt better after exercise was over. Will discuss PHQ 9 in the upcoming week    Expected Outcomes Dennie Bible will have decreased or controlled anxiety and better sleepipng habits upon completion of cardiac rehab.    Interventions Stress management education;Relaxation education;Encouraged to attend Cardiac Rehabilitation for the exercise    Continue Psychosocial Services  Follow up required by staff             Vocational Rehabilitation: Provide vocational rehab assistance to qualifying candidates.   Vocational Rehab Evaluation & Intervention:  Vocational Rehab - 02/01/24 1206       Initial Vocational Rehab Evaluation & Intervention   Assessment shows need for Vocational Rehabilitation No   Pt is retired            Education: Education Goals: Education classes will be provided on a weekly basis, covering required topics. Participant will state understanding/return demonstration of topics presented.    Education     Row Name 02/07/24 1600     Education   Cardiac Education Topics [facility]   Select Workshops     Workshops   Educator Exercise Physiologist   Select Psychosocial   Psychosocial Workshop Focused Goals, Sustainable Changes   Instruction Review Code 1- Verbalizes Understanding   Class Start Time 1345   Class Stop Time 1443   Class Time Calculation (min) 58 min    Row  Name 02/09/24 1600     Education   Cardiac Education Topics [facility]   Museum/gallery exhibitions officer   Weekly Topic Comforting Weekend Breakfasts   Instruction Review Code 1- Verbalizes Understanding   Class Start Time 1400   Class Stop Time 1440   Class Time Calculation (min) 40 min    Row Name 02/11/24 1500     Education   Cardiac Education Topics [facility]   Licensed conveyancer Nutrition   Nutrition Dining Out - Part 1   Instruction Review Code 1- Verbalizes Understanding   Class Start Time 1400   Class Stop Time 1440   Class Time Calculation (min) 40 min            Core Videos: Exercise    Move It!  Clinical staff conducted group or individual video education with verbal and written material and guidebook.  Patient learns the recommended [facility] exercise program. Exercise with the goal of living a long, healthy life. Some of the health benefits of exercise include controlled diabetes, healthier blood pressure levels, improved cholesterol levels, improved heart and lung capacity, improved sleep, and better body composition. Everyone should speak with their doctor before starting or changing an exercise routine.  Biomechanical Limitations Clinical staff conducted group or individual video education with verbal and written material and guidebook.  Patient learns how biomechanical limitations can impact exercise and how we can mitigate and possibly overcome limitations to have an impactful and balanced exercise routine.  Body Composition Clinical staff conducted group or individual video education with verbal and written material and guidebook.  Patient learns that body composition (ratio of muscle mass to fat mass) is a key component to assessing overall fitness, rather than body weight alone. Increased fat mass, especially visceral belly fat, can put [place] at increased risk for metabolic syndrome, type 2 diabetes, heart disease, and even death. It is recommended to combine diet and exercise  (cardiovascular and resistance training) to improve your body composition. Seek guidance from your physician and exercise physiologist before implementing an exercise routine.  Exercise Action Plan Clinical staff conducted group or individual video education with verbal and written material and guidebook.  Patient learns the recommended strategies to achieve and enjoy long-term exercise adherence, including variety, self-motivation, self-efficacy, and positive decision making. Benefits of exercise include fitness, good health, weight management, more energy, better sleep, less stress, and overall well-being.  Medical   Heart Disease Risk Reduction Clinical staff conducted group or individual video education with verbal and written material and guidebook.  Patient learns our heart is our most vital organ as it circulates oxygen, nutrients, white blood cells, and hormones throughout the entire body, and carries waste away. Data supports a plant-based eating plan like the [facility] Program for its effectiveness in slowing progression of and reversing heart disease. The video provides a number of recommendations to address heart disease.   Metabolic Syndrome and Belly Fat  Clinical staff conducted group or individual video education with verbal and written material and guidebook.  Patient learns what metabolic syndrome is, how it leads to heart disease, and how one can reverse it and keep it from coming back. You have metabolic syndrome if you have 3 of the following 5 criteria: abdominal obesity, high blood pressure, high triglycerides, low HDL cholesterol, and high blood sugar.  Hypertension and Heart Disease Clinical staff conducted group  or individual video education with verbal and written material and guidebook.  Patient learns that high blood pressure, or hypertension, is very common in the [place]. Hypertension is largely due to excessive salt intake, but other important risk factors  include being overweight, physical inactivity, drinking too much alcohol, smoking, and not eating enough potassium from fruits and vegetables. High blood pressure is a leading risk factor for heart attack, stroke, congestive heart failure, dementia, kidney failure, and premature death. Long-term effects of excessive salt intake include stiffening of the arteries and thickening of heart muscle and organ damage. Recommendations include ways to reduce hypertension and the risk of heart disease.  Diseases of Our Time - Focusing on Diabetes Clinical staff conducted group or individual video education with verbal and written material and guidebook.  Patient learns why the best way to stop diseases of our time is prevention, through food and other lifestyle changes. Medicine (such as prescription pills and surgeries) is often only a Band-Aid on the problem, not a long-term solution. Most common diseases of our time include obesity, type 2 diabetes, hypertension, heart disease, and cancer. The Pritikin Program is recommended and has been proven to help reduce, reverse, and/or prevent the damaging effects of metabolic syndrome.  Nutrition   Overview of the Pritikin Eating Plan  Clinical staff conducted group or individual video education with verbal and written material and guidebook.  Patient learns about the Pritikin Eating Plan for disease risk reduction. The Pritikin Eating Plan emphasizes a wide variety of unrefined, minimally-processed carbohydrates, like fruits, vegetables, whole grains, and legumes. Go, Caution, and Stop food choices are explained. Plant-based and lean animal proteins are emphasized. Rationale provided for low sodium intake for blood pressure control, low added sugars for blood sugar stabilization, and low added fats and oils for coronary artery disease risk reduction and weight management.  Calorie Density  Clinical staff conducted group or individual video education with verbal and  written material and guidebook.  Patient learns about calorie density and how it impacts the Pritikin Eating Plan. Knowing the characteristics of the food you choose will help you decide whether those foods will lead to weight gain or weight loss, and whether you want to consume more or less of them. Weight loss is usually a side effect of the Pritikin Eating Plan because of its focus on low calorie-dense foods.  Label Reading  Clinical staff conducted group or individual video education with verbal and written material and guidebook.  Patient learns about the Pritikin recommended label reading guidelines and corresponding recommendations regarding calorie density, added sugars, sodium content, and whole grains.  Dining Out - Part 1  Clinical staff conducted group or individual video education with verbal and written material and guidebook.  Patient learns that restaurant meals can be sabotaging because they can be so high in calories, fat, sodium, and/or sugar. Patient learns recommended strategies on how to positively address this and avoid unhealthy pitfalls.  Facts on Fats  Clinical staff conducted group or individual video education with verbal and written material and guidebook.  Patient learns that lifestyle modifications can be just as effective, if not more so, as many medications for lowering your risk of heart disease. A Pritikin lifestyle can help to reduce your risk of inflammation and atherosclerosis (cholesterol build-up, or plaque, in the artery walls). Lifestyle interventions such as dietary choices and physical activity address the cause of atherosclerosis. A review of the types of fats and their impact on blood cholesterol levels, along with  dietary recommendations to reduce fat intake is also included.  Nutrition Action Plan  Clinical staff conducted group or individual video education with verbal and written material and guidebook.  Patient learns how to incorporate Pritikin  recommendations into their lifestyle. Recommendations include planning and keeping personal health goals in mind as an important part of their success.  Healthy Mind-Set    Healthy Minds, Bodies, Hearts  Clinical staff conducted group or individual video education with verbal and written material and guidebook.  Patient learns how to identify when they are stressed. Video will discuss the impact of that stress, as well as the many benefits of stress management. Patient will also be introduced to stress management techniques. The way we think, act, and feel has an impact on our hearts.  How Our Thoughts Can Heal Our Hearts  Clinical staff conducted group or individual video education with verbal and written material and guidebook.  Patient learns that negative thoughts can cause depression and anxiety. This can result in negative lifestyle behavior and serious health problems. Cognitive behavioral therapy is an effective method to help control our thoughts in order to change and improve our emotional outlook.  Additional Videos:  Exercise    Improving Performance  Clinical staff conducted group or individual video education with verbal and written material and guidebook.  Patient learns to use a non-linear approach by alternating intensity levels and lengths of time spent exercising to help burn more calories and lose more body fat. Cardiovascular exercise helps improve heart health, metabolism, hormonal balance, blood sugar control, and recovery from fatigue. Resistance training improves strength, endurance, balance, coordination, reaction time, metabolism, and muscle mass. Flexibility exercise improves circulation, posture, and balance. Seek guidance from your physician and exercise physiologist before implementing an exercise routine and learn your capabilities and proper form for all exercise.  Introduction to Yoga  Clinical staff conducted group or individual video education with verbal and  written material and guidebook.  Patient learns about yoga, a discipline of the coming together of mind, breath, and body. The benefits of yoga include improved flexibility, improved range of motion, better posture and core strength, increased lung function, weight loss, and positive self-image. Yoga's heart health benefits include lowered blood pressure, healthier heart rate, decreased cholesterol and triglyceride levels, improved immune function, and reduced stress. Seek guidance from your physician and exercise physiologist before implementing an exercise routine and learn your capabilities and proper form for all exercise.  Medical   Aging: Enhancing Your Quality of Life  Clinical staff conducted group or individual video education with verbal and written material and guidebook.  Patient learns key strategies and recommendations to stay in good physical health and enhance quality of life, such as prevention strategies, having an advocate, securing a Health Care Proxy and Power of Attorney, and keeping a list of medications and system for tracking them. It also discusses how to avoid risk for bone loss.  Biology of Weight Control  Clinical staff conducted group or individual video education with verbal and written material and guidebook.  Patient learns that weight gain occurs because we consume more calories than we burn (eating more, moving less). Even if your body weight is normal, you may have higher ratios of fat compared to muscle mass. Too much body fat puts you at increased risk for cardiovascular disease, heart attack, stroke, type 2 diabetes, and obesity-related cancers. In addition to exercise, following the Pritikin Eating Plan can help reduce your risk.  Decoding Lab Results  Clinical staff  conducted group or individual video education with verbal and written material and guidebook.  Patient learns that lab test reflects one measurement whose values change over time and are influenced  by many factors, including medication, stress, sleep, exercise, food, hydration, pre-existing medical conditions, and more. It is recommended to use the knowledge from this video to become more involved with your lab results and evaluate your numbers to speak with your doctor.   Diseases of Our Time - Overview  Clinical staff conducted group or individual video education with verbal and written material and guidebook.  Patient learns that according to the CDC, 50% to 70% of chronic diseases (such as obesity, type 2 diabetes, elevated lipids, hypertension, and heart disease) are avoidable through lifestyle improvements including healthier food choices, listening to satiety cues, and increased physical activity.  Sleep Disorders Clinical staff conducted group or individual video education with verbal and written material and guidebook.  Patient learns how good quality and duration of sleep are important to overall health and well-being. Patient also learns about sleep disorders and how they impact health along with recommendations to address them, including discussing with a physician.  Nutrition  Dining Out - Part 2 Clinical staff conducted group or individual video education with verbal and written material and guidebook.  Patient learns how to plan ahead and communicate in order to maximize their dining experience in a healthy and nutritious manner. Included are recommended food choices based on the type of restaurant the patient is visiting.   Fueling a Banker conducted group or individual video education with verbal and written material and guidebook.  There is a strong connection between our food choices and our health. Diseases like obesity and type 2 diabetes are very prevalent and are in large-part due to lifestyle choices. The Pritikin Eating Plan provides plenty of food and hunger-curbing satisfaction. It is easy to follow, affordable, and helps reduce health  risks.  Menu Workshop  Clinical staff conducted group or individual video education with verbal and written material and guidebook.  Patient learns that restaurant meals can sabotage health goals because they are often packed with calories, fat, sodium, and sugar. Recommendations include strategies to plan ahead and to communicate with the manager, chef, or server to help order a healthier meal.  Planning Your Eating Strategy  Clinical staff conducted group or individual video education with verbal and written material and guidebook.  Patient learns about the Pritikin Eating Plan and its benefit of reducing the risk of disease. The Pritikin Eating Plan does not focus on calories. Instead, it emphasizes high-quality, nutrient-rich foods. By knowing the characteristics of the foods, we choose, we can determine their calorie density and make informed decisions.  Targeting Your Nutrition Priorities  Clinical staff conducted group or individual video education with verbal and written material and guidebook.  Patient learns that lifestyle habits have a tremendous impact on disease risk and progression. This video provides eating and physical activity recommendations based on your personal health goals, such as reducing LDL cholesterol, losing weight, preventing or controlling type 2 diabetes, and reducing high blood pressure.  Vitamins and Minerals  Clinical staff conducted group or individual video education with verbal and written material and guidebook.  Patient learns different ways to obtain key vitamins and minerals, including through a recommended healthy diet. It is important to discuss all supplements you take with your doctor.   Healthy Mind-Set    Smoking Cessation  Clinical staff conducted group or individual video  education with verbal and written material and guidebook.  Patient learns that cigarette smoking and tobacco addiction pose a serious health risk which affects millions of  people. Stopping smoking will significantly reduce the risk of heart disease, lung disease, and many forms of cancer. Recommended strategies for quitting are covered, including working with your doctor to develop a successful plan.  Culinary   Becoming a Set designer conducted group or individual video education with verbal and written material and guidebook.  Patient learns that cooking at home can be healthy, cost-effective, quick, and puts them in control. Keys to cooking healthy recipes will include looking at your recipe, assessing your equipment needs, planning ahead, making it simple, choosing cost-effective seasonal ingredients, and limiting the use of added fats, salts, and sugars.  Cooking - Breakfast and Snacks  Clinical staff conducted group or individual video education with verbal and written material and guidebook.  Patient learns how important breakfast is to satiety and nutrition through the entire day. Recommendations include key foods to eat during breakfast to help stabilize blood sugar levels and to prevent overeating at meals later in the day. Planning ahead is also a key component.  Cooking - Educational psychologist conducted group or individual video education with verbal and written material and guidebook.  Patient learns eating strategies to improve overall health, including an approach to cook more at home. Recommendations include thinking of animal protein as a side on your plate rather than center stage and focusing instead on lower calorie dense options like vegetables, fruits, whole grains, and plant-based proteins, such as beans. Making sauces in large quantities to [name] for later and leaving the skin on your vegetables are also recommended to maximize your experience.  Cooking - Healthy Salads and Dressing Clinical staff conducted group or individual video education with verbal and written material and guidebook.  Patient learns that  vegetables, fruits, whole grains, and legumes are the foundations of the Pritikin Eating Plan. Recommendations include how to incorporate each of these in flavorful and healthy salads, and how to create homemade salad dressings. Proper handling of ingredients is also covered. Cooking - Soups and [employer] - Soups and Desserts Clinical staff conducted group or individual video education with verbal and written material and guidebook.  Patient learns that Pritikin soups and desserts make for easy, nutritious, and delicious snacks and meal components that are low in sodium, fat, sugar, and calorie density, while high in vitamins, minerals, and filling fiber. Recommendations include simple and healthy ideas for soups and desserts.   Overview     The Pritikin Solution Program Overview Clinical staff conducted group or individual video education with verbal and written material and guidebook.  Patient learns that the results of the Pritikin Program have been documented in more than 100 articles published in peer-reviewed journals, and the benefits include reducing risk factors for (and, in some cases, even reversing) high cholesterol, high blood pressure, type 2 diabetes, obesity, and more! An overview of the three key pillars of the Pritikin Program will be covered: eating well, doing regular exercise, and having a healthy mind-set.  WORKSHOPS  Exercise: Exercise Basics: Building Your Action Plan Clinical staff led group instruction and group discussion with PowerPoint presentation and patient guidebook. To enhance the learning environment the use of posters, models and videos may be added. At the conclusion of this workshop, patients will comprehend the difference between physical activity and exercise, as well as the benefits of  incorporating both, into their routine. Patients will understand the FITT (Frequency, Intensity, Time, and Type) principle and how to use it to build an exercise action  plan. In addition, safety concerns and other considerations for exercise and cardiac rehab will be addressed by the presenter. The purpose of this lesson is to promote a comprehensive and effective weekly exercise routine in order to improve patients' overall level of fitness.   Managing Heart Disease: Your Path to a Healthier Heart Clinical staff led group instruction and group discussion with PowerPoint presentation and patient guidebook. To enhance the learning environment the use of posters, models and videos may be added.At the conclusion of this workshop, patients will understand the anatomy and physiology of the heart. Additionally, they will understand how Pritikin's three pillars impact the risk factors, the progression, and the management of heart disease.  The purpose of this lesson is to provide a high-level overview of the heart, heart disease, and how the Pritikin lifestyle positively impacts risk factors.  Exercise Biomechanics Clinical staff led group instruction and group discussion with PowerPoint presentation and patient guidebook. To enhance the learning environment the use of posters, models and videos may be added. Patients will learn how the structural parts of their bodies function and how these functions impact their daily activities, movement, and exercise. Patients will learn how to promote a neutral spine, learn how to manage pain, and identify ways to improve their physical movement in order to promote healthy living. The purpose of this lesson is to expose patients to common physical limitations that impact physical activity. Participants will learn practical ways to adapt and manage aches and pains, and to minimize their effect on regular exercise. Patients will learn how to maintain good posture while sitting, walking, and lifting.  Balance Training and Fall Prevention  Clinical staff led group instruction and group discussion with PowerPoint presentation and  patient guidebook. To enhance the learning environment the use of posters, models and videos may be added. At the conclusion of this workshop, patients will understand the importance of their sensorimotor skills (vision, proprioception, and the vestibular system) in maintaining their ability to balance as they age. Patients will apply a variety of balancing exercises that are appropriate for their current level of function. Patients will understand the common causes for poor balance, possible solutions to these problems, and ways to modify their physical environment in order to minimize their fall risk. The purpose of this lesson is to teach patients about the importance of maintaining balance as they age and ways to minimize their risk of falling.  WORKSHOPS   Nutrition:  Fueling a Ship broker led group instruction and group discussion with PowerPoint presentation and patient guidebook. To enhance the learning environment the use of posters, models and videos may be added. Patients will review the foundational principles of the Pritikin Eating Plan and understand what constitutes a serving size in each of the food groups. Patients will also learn Pritikin-friendly foods that are better choices when away from home and review make-ahead meal and snack options. Calorie density will be reviewed and applied to three nutrition priorities: weight maintenance, weight loss, and weight gain. The purpose of this lesson is to reinforce (in a group setting) the key concepts around what patients are recommended to eat and how to apply these guidelines when away from home by planning and selecting Pritikin-friendly options. Patients will understand how calorie density may be adjusted for different weight management goals.  Mindful Eating  Clinical  staff led group instruction and group discussion with PowerPoint presentation and patient guidebook. To enhance the learning environment the use of posters,  models and videos may be added. Patients will briefly review the concepts of the Pritikin Eating Plan and the importance of low-calorie dense foods. The concept of mindful eating will be introduced as well as the importance of paying attention to internal hunger signals. Triggers for non-hunger eating and techniques for dealing with triggers will be explored. The purpose of this lesson is to provide patients with the opportunity to review the basic principles of the Pritikin Eating Plan, discuss the value of eating mindfully and how to measure internal cues of hunger and fullness using the Hunger Scale. Patients will also discuss reasons for non-hunger eating and learn strategies to use for controlling emotional eating.  Targeting Your Nutrition Priorities Clinical staff led group instruction and group discussion with PowerPoint presentation and patient guidebook. To enhance the learning environment the use of posters, models and videos may be added. Patients will learn how to determine their genetic susceptibility to disease by reviewing their family history. Patients will gain insight into the importance of diet as part of an overall healthy lifestyle in mitigating the impact of genetics and other environmental insults. The purpose of this lesson is to provide patients with the opportunity to assess their personal nutrition priorities by looking at their family history, their own health history and current risk factors. Patients will also be able to discuss ways of prioritizing and modifying the Pritikin Eating Plan for their highest risk areas  Menu  Clinical staff led group instruction and group discussion with PowerPoint presentation and patient guidebook. To enhance the learning environment the use of posters, models and videos may be added. Using menus brought in from [employer], or printed from [employer], patients will apply the Pritikin dining out guidelines that were presented in the  [employer] video. Patients will also be able to practice these guidelines in a variety of provided scenarios. The purpose of this lesson is to provide patients with the opportunity to practice hands-on learning of the Pritikin Dining Out guidelines with actual menus and practice scenarios.  Label Reading Clinical staff led group instruction and group discussion with PowerPoint presentation and patient guidebook. To enhance the learning environment the use of posters, models and videos may be added. Patients will review and discuss the Pritikin label reading guidelines presented in Pritikin's Label Reading Educational series video. Using fool labels brought in from local grocery stores and markets, patients will apply the label reading guidelines and determine if the packaged food meet the Pritikin guidelines. The purpose of this lesson is to provide patients with the opportunity to review, discuss, and practice hands-on learning of the Pritikin Label Reading guidelines with actual packaged food labels. Cooking School  Pritikin's [employer] are designed to teach patients ways to prepare quick, simple, and affordable recipes at home. The importance of nutrition's role in chronic disease risk reduction is reflected in its emphasis in the overall Pritikin program. By learning how to prepare essential core Pritikin Eating Plan recipes, patients will increase control over what they eat; be able to customize the flavor of foods without the use of added salt, sugar, or fat; and improve the quality of the food they consume. By learning a set of core recipes which are easily assembled, quickly prepared, and affordable, patients are more likely to prepare more healthy foods at home. These workshops focus on convenient  breakfasts, simple entres, side dishes, and desserts which can be prepared with minimal effort and are consistent with nutrition recommendations for cardiovascular risk  reduction. Cooking [employer] are taught by a Armed forces logistics/support/administrative officer (RD) who has been trained by the [employer]. The chef or RD has a clear understanding of the importance of minimizing - if not completely eliminating - added fat, sugar, and sodium in recipes. Throughout the series of Cooking School Workshop sessions, patients will learn about healthy ingredients and efficient methods of cooking to build confidence in their capability to prepare    Cooking School weekly topics:  Adding Flavor- Sodium-Free  Fast and Healthy Breakfasts  Powerhouse Plant-Based Proteins  Satisfying Salads and Dressings  Simple Sides and Sauces  International Cuisine-Spotlight on the [employer] Zones  Delicious Desserts  Savory Soups  [employer] - Meals in a Astronomer Appetizers and Snacks  Comforting Weekend Breakfasts  One-Pot Wonders   Fast Evening Meals  Landscape architect Your Pritikin Plate  WORKSHOPS   Healthy Mindset (Psychosocial):  Focused Goals, Sustainable Changes Clinical staff led group instruction and group discussion with PowerPoint presentation and patient guidebook. To enhance the learning environment the use of posters, models and videos may be added. Patients will be able to apply effective goal setting strategies to establish at least one personal goal, and then take consistent, meaningful action toward that goal. They will learn to identify common barriers to achieving personal goals and develop strategies to overcome them. Patients will also gain an understanding of how our mind-set can impact our ability to achieve goals and the importance of cultivating a positive and growth-oriented mind-set. The purpose of this lesson is to provide patients with a deeper understanding of how to set and achieve personal goals, as well as the tools and strategies needed to overcome common obstacles which may arise along the way.  From Head to Heart: The  Power of a Healthy Outlook  Clinical staff led group instruction and group discussion with PowerPoint presentation and patient guidebook. To enhance the learning environment the use of posters, models and videos may be added. Patients will be able to recognize and describe the impact of emotions and mood on physical health. They will discover the importance of self-care and explore self-care practices which may work for them. Patients will also learn how to utilize the 4 C's to cultivate a healthier outlook and better manage stress and challenges. The purpose of this lesson is to demonstrate to patients how a healthy outlook is an essential part of maintaining good health, especially as they continue their cardiac rehab journey.  Healthy Sleep for a Healthy Heart Clinical staff led group instruction and group discussion with PowerPoint presentation and patient guidebook. To enhance the learning environment the use of posters, models and videos may be added. At the conclusion of this workshop, patients will be able to demonstrate knowledge of the importance of sleep to overall health, well-being, and quality of life. They will understand the symptoms of, and treatments for, common sleep disorders. Patients will also be able to identify daytime and nighttime behaviors which impact sleep, and they will be able to apply these tools to help manage sleep-related challenges. The purpose of this lesson is to provide patients with a general overview of sleep and outline the importance of quality sleep. Patients will learn about a few of the most common sleep disorders. Patients will also be introduced to the concept of "sleep hygiene," and  discover ways to self-manage certain sleeping problems through simple daily behavior changes. Finally, the workshop will motivate patients by clarifying the links between quality sleep and their goals of heart-healthy living.   Recognizing and Reducing Stress Clinical staff led  group instruction and group discussion with PowerPoint presentation and patient guidebook. To enhance the learning environment the use of posters, models and videos may be added. At the conclusion of this workshop, patients will be able to understand the types of stress reactions, differentiate between acute and chronic stress, and recognize the impact that chronic stress has on their health. They will also be able to apply different coping mechanisms, such as reframing negative self-talk. Patients will have the opportunity to practice a variety of stress management techniques, such as deep abdominal breathing, progressive muscle relaxation, and/or guided imagery.  The purpose of this lesson is to educate patients on the role of stress in their lives and to provide healthy techniques for coping with it.  Learning Barriers/Preferences:  Learning Barriers/Preferences - 02/01/24 1206       Learning Barriers/Preferences   Learning Barriers Sight   wears reading glasses   Learning Preferences Audio;Verbal Instruction;Computer/Internet;Video;Written Material;Group Instruction;Individual Instruction;Pictoral;Skilled Demonstration             Education Topics:  Knowledge Questionnaire Score:  Knowledge Questionnaire Score - 02/01/24 1329       Knowledge Questionnaire Score   Pre Score 24/24             Core Components/Risk Factors/Patient Goals at Admission:  Personal Goals and Risk Factors at Admission - 02/01/24 1206       Core Components/Risk Factors/Patient Goals on Admission    Weight Management Yes;Obesity;Weight Loss    Intervention Weight Management: Develop a combined nutrition and exercise program designed to reach desired caloric intake, while maintaining appropriate intake of nutrient and fiber, sodium and fats, and appropriate energy expenditure required for the weight goal.;Weight Management: Provide education and appropriate resources to help participant work on and attain  dietary goals.;Weight Management/Obesity: Establish reasonable short term and long term weight goals.    Admit Weight 187 lb 14.4 oz (85.2 kg)    Goal Weight: Long Term 176 lb (79.8 kg)    Expected Outcomes Short Term: Continue to assess and modify interventions until short term weight is achieved;Long Term: Adherence to nutrition and physical activity/exercise program aimed toward attainment of established weight goal;Weight Loss: Understanding of general recommendations for a balanced deficit meal plan, which promotes 1-2 lb weight loss per week and includes a negative energy balance of [phone] kcal/d;Understanding recommendations for meals to include 15-35% energy as protein, 25-35% energy from fat, 35-60% energy from carbohydrates, less than 200mg  of dietary cholesterol, 20-35 gm of total fiber daily;Understanding of distribution of calorie intake throughout the day with the consumption of 4-5 meals/snacks    Hypertension Yes    Intervention Provide education on lifestyle modifcations including regular physical activity/exercise, weight management, moderate sodium restriction and increased consumption of fresh fruit, vegetables, and low fat dairy, alcohol moderation, and smoking cessation.;Monitor prescription use compliance.    Expected Outcomes Short Term: Continued assessment and intervention until BP is < 140/18mm HG in hypertensive participants. < 130/30mm HG in hypertensive participants with diabetes, heart failure or chronic kidney disease.;Long Term: Maintenance of blood pressure at goal levels.    Lipids Yes    Intervention Provide education and support for participant on nutrition & aerobic/resistive exercise along with prescribed medications to achieve LDL 70mg , HDL >40mg .  Expected Outcomes Short Term: Participant states understanding of desired cholesterol values and is compliant with medications prescribed. Participant is following exercise prescription and nutrition guidelines.;Long  Term: Cholesterol controlled with medications as prescribed, with individualized exercise RX and with personalized nutrition plan. Value goals: LDL < 70mg , HDL > 40 mg.             Core Components/Risk Factors/Patient Goals Review:   Goals and Risk Factor Review     Row Name 02/07/24 1641 02/11/24 0911           Core Components/Risk Factors/Patient Goals Review   Personal Goals Review Weight Management/Obesity;Hypertension;Lipids Weight Management/Obesity;Hypertension;Lipids      Review Dennie Bible started cardiac rehab on 02/07/24. Pat did well with exercise. vital signs and CBg's were stable. Pat started cardiac rehab on 02/07/24. Dennie Bible is off to a good start  with exercise. vital signs and CBg's were stable. Dennie Bible is not taking metformin at this time, Spot checked CBG      Expected Outcomes Dennie Bible will continue to participate in cardiac rehab for exercise, nutrition and lifestyle modifications Dennie Bible will continue to participate in cardiac rehab for exercise, nutrition and lifestyle modifications               Core Components/Risk Factors/Patient Goals at Discharge (Final Review):   Goals and Risk Factor Review - 02/11/24 0911       Core Components/Risk Factors/Patient Goals Review   Personal Goals Review Weight Management/Obesity;Hypertension;Lipids    Review Dennie Bible started cardiac rehab on 02/07/24. Dennie Bible is off to a good start  with exercise. vital signs and CBg's were stable. Dennie Bible is not taking metformin at this time, Spot checked CBG    Expected Outcomes Dennie Bible will continue to participate in cardiac rehab for exercise, nutrition and lifestyle modifications             ITP Comments:  ITP Comments     Row Name 02/01/24 1012 02/07/24 1638 02/11/24 0909       ITP Comments Dr. Armanda Magic medical director. Introduction to pritikin education/intensive cardiac rehab. Initial orientation packet reviewed with patient. 30 Day ITP Review. Pat started cardiac rehab on 02/07/24. Pat did well with  exercise. 30 Day ITP Review. Pat started cardiac rehab on 02/07/24. Dennie Bible is off to a good start  with exercise.              Comments: See ITP comments.Thayer Headings RN BSN

## 2024-02-14 ENCOUNTER — Encounter (HOSPITAL_COMMUNITY)

## 2024-02-14 ENCOUNTER — Other Ambulatory Visit (HOSPITAL_COMMUNITY): Payer: Self-pay

## 2024-02-14 ENCOUNTER — Telehealth (HOSPITAL_COMMUNITY): Payer: Self-pay

## 2024-02-14 NOTE — Telephone Encounter (Signed)
 Patient called out due to "stomach bug". Confirmed she does not have fever, cough or other flu/COVID symptoms. Patient advised she must be symptom-free for 48 hours before returning to classes.

## 2024-02-15 ENCOUNTER — Ambulatory Visit (INDEPENDENT_AMBULATORY_CARE_PROVIDER_SITE_OTHER): Admitting: Family Medicine

## 2024-02-15 ENCOUNTER — Other Ambulatory Visit (HOSPITAL_COMMUNITY): Payer: Self-pay

## 2024-02-15 ENCOUNTER — Encounter: Payer: Self-pay | Admitting: Family Medicine

## 2024-02-15 VITALS — BP 132/74 | HR 63 | Temp 97.8°F | Ht 65.25 in | Wt 184.9 lb

## 2024-02-15 DIAGNOSIS — H9222 Otorrhagia, left ear: Secondary | ICD-10-CM | POA: Diagnosis not present

## 2024-02-15 DIAGNOSIS — R0981 Nasal congestion: Secondary | ICD-10-CM | POA: Diagnosis not present

## 2024-02-15 DIAGNOSIS — E782 Mixed hyperlipidemia: Secondary | ICD-10-CM | POA: Diagnosis not present

## 2024-02-15 DIAGNOSIS — H6123 Impacted cerumen, bilateral: Secondary | ICD-10-CM | POA: Diagnosis not present

## 2024-02-15 LAB — POC COVID19 BINAXNOW: SARS Coronavirus 2 Ag: NEGATIVE

## 2024-02-15 LAB — POCT INFLUENZA A/B
Influenza A, POC: NEGATIVE
Influenza B, POC: NEGATIVE

## 2024-02-15 NOTE — Progress Notes (Signed)
 Established Patient Office Visit  Subjective   Patient ID: Shelly [name], female    DOB: 21-Jun-1946  Age: 78 y.o. MRN: [id number]  Chief Complaint  Patient presents with   Ear Injury    Left ear is bleeding     HPI   Shelly [name] is seen with a bit of bleeding from her left ear noted yesterday.  She gives recent history that she and her husband went for a walk in the park Sunday.  Pollen was very heavy.  She had some increased congestion symptoms afterwards which she attributed to the allergies.  That night, she made a tomato dish and she ended up developing some nausea and vomiting later that night which she attributed to the acidity of the tomatoes.  Has had no symptoms since then.  No chest pains.  Felt much better yesterday.  Yesterday did notice some congestion in her left ear.  She thought she may have had some water trapped and stuck her finger in her ear and thinks that she may have nicked the wall.  She has noted little bit of dried blood since then.  No significant ear pain.  Denies any cough or fever.  No ongoing nasal congestion.  No sinus pain.  Past Medical History:  Diagnosis Date   Allergy    Arthritis    wrist, shoulder    Cancer (HCC) 08/2019   left breast IDC   Diverticulitis    Endometrial polyp    Family history of breast cancer    GERD (gastroesophageal reflux disease)    occasional - diet controlled   Hx of adenomatous colonic polyps    Hyperlipidemia    IBS (irritable bowel syndrome)    Missed abortion    x 1 - resolved - no surgery   Osteoporosis    SVD (spontaneous vaginal delivery)    x 1   Transient vision disturbance 2019   was evaluated by ED, no findings   Vitamin D deficiency    Past Surgical History:  Procedure Laterality Date   BREAST EXCISIONAL BIOPSY Right 08/2019   Benign, lesion    BREAST LUMPECTOMY Left 09/2019   BREAST LUMPECTOMY WITH RADIOACTIVE SEED AND SENTINEL LYMPH NODE BIOPSY Left 10/06/2019   Procedure: LEFT BREAST  LUMPECTOMY  X2 WITH RADIOACTIVE SEED X2  AND SENTINEL LYMPH NODE BIOPSY;  Surgeon: Griselda Miner, MD;  Location: [facility];  Service: General;  Laterality: Left;   BREAST LUMPECTOMY WITH RADIOACTIVE SEED LOCALIZATION Right 10/06/2019   Procedure: RIGHT BREAST LUMPECTOMY WITH RADIOACTIVE SEED LOCALIZATION;  Surgeon: Griselda Miner, MD;  Location: [facility];  Service: General;  Laterality: Right;   BRONCHIAL BIOPSY  12/27/2020   Procedure: BRONCHIAL BIOPSIES;  Surgeon: Josephine Igo, DO;  Location: [facility];  Service: Pulmonary;;   BRONCHIAL BRUSHINGS  12/27/2020   Procedure: BRONCHIAL BRUSHINGS;  Surgeon: Josephine Igo, DO;  Location: [facility];  Service: Pulmonary;;   BRONCHIAL NEEDLE ASPIRATION BIOPSY  12/27/2020   Procedure: BRONCHIAL NEEDLE ASPIRATION BIOPSIES;  Surgeon: Josephine Igo, DO;  Location: [facility];  Service: Pulmonary;;   BRONCHIAL WASHINGS  12/27/2020   Procedure: BRONCHIAL WASHINGS;  Surgeon: Josephine Igo, DO;  Location: [facility];  Service: Pulmonary;;   COLONOSCOPY     CORONARY ANGIOGRAPHY N/A 01/18/2024   Procedure: CORONARY ANGIOGRAPHY;  Surgeon: [name], MD;  Location: [facility];  Service: Cardiovascular;  Laterality: N/A;   CORONARY IMAGING/OCT N/A 01/18/2024  Procedure: CORONARY IMAGING/OCT;  Surgeon: [name], MD;  Location: [facility];  Service: Cardiovascular;  Laterality: N/A;   CORONARY STENT INTERVENTION N/A 01/18/2024   Procedure: CORONARY STENT INTERVENTION;  Surgeon: [name], MD;  Location: [facility];  Service: Cardiovascular;  Laterality: N/A;   DIAGNOSTIC LAPAROSCOPY     cysts   DILATATION & CURETTAGE/HYSTEROSCOPY WITH TRUECLEAR N/A 10/31/2013   Procedure: DILATATION & CURETTAGE/HYSTEROSCOPY WITH TRUCLEAR;  Surgeon: Zelphia Cairo, MD;  Location: [facility];  Service: Gynecology;  Laterality: N/A;   DILATION AND CURETTAGE OF UTERUS     MASS EXCISION Left  11/06/2019   Procedure: EXCISION NIPPLE AND AREOLA  MARGIN LEFT BREAST;  Surgeon: Griselda Miner, MD;  Location: [facility];  Service: General;  Laterality: Left;   TONSILLECTOMY  1952   VIDEO BRONCHOSCOPY WITH ENDOBRONCHIAL NAVIGATION Bilateral 12/27/2020   Procedure: VIDEO BRONCHOSCOPY WITH ENDOBRONCHIAL NAVIGATION;  Surgeon: Josephine Igo, DO;  Location: [facility];  Service: Pulmonary;  Laterality: Bilateral;   VIDEO BRONCHOSCOPY WITH ENDOBRONCHIAL ULTRASOUND Bilateral 12/27/2020   Procedure: VIDEO BRONCHOSCOPY WITH ENDOBRONCHIAL ULTRASOUND;  Surgeon: Josephine Igo, DO;  Location: [facility];  Service: Pulmonary;  Laterality: Bilateral;   WISDOM TOOTH EXTRACTION      reports that she quit smoking about 16 years ago. Her smoking use included cigarettes. She started smoking about 36 years ago. She has a 2 pack-year smoking history. She has never used smokeless tobacco. She reports current alcohol use of about 14.0 standard drinks of alcohol per week. She reports that she does not use drugs. family history includes Brain cancer in her paternal uncle; Breast cancer (age of onset: 56) in her cousin; Breast cancer (age of onset: 8) in her maternal aunt and maternal aunt; Cancer in her maternal grandfather, maternal uncle, and another family member; Colon polyps in her mother; Depression in her brother; Diabetes in her mother; Heart disease in an other family member; Hyperlipidemia in her father and mother; Hypertension in her mother and another family member; Leukemia (age of onset: 18) in her mother; Lung cancer in her father and maternal grandfather; Throat cancer in her paternal uncle; Uterine cancer in her paternal grandmother. Allergies  Allergen Reactions   Taxotere [Docetaxel] Swelling   Abraxane [Paclitaxel Protein-Bound Part] Other (See Comments)    3 days after receiving she had one episode of very high BP and HR. This happened again 2 days later during the night.     Amlodipine     Caused IBS flare-up. Returned after stopping and restarting med   [employer] Calcium] Itching    GI upset   Lipitor [Atorvastatin Calcium] Itching   Lisinopril Cough   Losartan     Diffuse arthralgias and back pain. Returned after stopping and restarting med   Nsaids Other (See Comments)    GI upset   Pravastatin Itching    GI upset   Sulfonamide Derivatives Rash    Hives, itiching     Review of Systems  Constitutional:  Negative for chills and fever.  HENT:  Positive for congestion. Negative for ear discharge, ear pain and hearing loss.   Respiratory:  Negative for cough.       Objective:     BP 132/74 (BP Location: Left Arm, Patient Position: Sitting, Cuff Size: Normal)   Pulse 63   Temp 97.8 F (36.6 C) (Oral)   Ht 5' 5.25" (1.657 m)   Wt 184 lb 14.4 oz (83.9 kg)  SpO2 95%   BMI 30.53 kg/m  BP Readings from Last 3 Encounters:  02/15/24 132/74  02/02/24 138/60  02/01/24 128/60   Wt Readings from Last 3 Encounters:  02/15/24 184 lb 14.4 oz (83.9 kg)  02/02/24 184 lb 3.2 oz (83.6 kg)  02/01/24 187 lb 13.3 oz (85.2 kg)      Physical Exam Vitals reviewed.  Constitutional:      General: She is not in acute distress.    Appearance: She is not ill-appearing.  HENT:     Ears:     Comments: She has some cerumen in both canals.  Very small amount of dried blood in the left external canal.  No active bleeding. Cardiovascular:     Rate and Rhythm: Normal rate and regular rhythm.  Pulmonary:     Effort: Pulmonary effort is normal.     Breath sounds: Normal breath sounds. No wheezing or rales.  Neurological:     Mental Status: She is alert.      Results for orders placed or performed in visit on 02/15/24  POC COVID-19 BinaxNow  Result Value Ref Range   SARS Coronavirus 2 Ag Negative Negative  POC Influenza A/B  Result Value Ref Range   Influenza A, POC Negative Negative   Influenza B, POC Negative Negative      The 10-year  ASCVD risk score (Arnett DK, et al., 2019) is: 52.3%    Assessment & Plan:   #1 nasal congestion.  Suspect related to allergic rhinitis.  She had ,influenza and COVID testing which were both negative.  No evidence for bacterial illness at this time.  Lung exam was clear.  Continue over-the-counter plain antihistamine as needed may supplement with nasal steroid if needed  #2 cerumen impaction bilaterally.  We discussed risk of removal with curette including low risk of bleeding and trauma.  Patient consented.  We are able to remove a large piece of cerumen out bilaterally.  She had a little bit of tiny residual wax deep in the canal up next to the eardrum  We discussed outpatient use of Cerumenex or Debrox and leave for 30 minutes to an hour followed by gentle irrigation with lukewarm water with bulb syringe to help soften up and remove any what remaining wax  #3 history of CAD.  Patient recently went on a statin.  Reminder to set up fasting lipid and hepatic panel in a couple of weeks.  #4 bleeding from left ear.  This is very minimal and probably related where she placed her finger with a fairly long nail into the canal.  No active bleeding at this time.  She does not take any anticoagulants.  She does take Brilinta and aspirin.  Reassurance.  No evidence for trauma to the ear drum  No follow-ups on file.    Evelena Peat, MD

## 2024-02-15 NOTE — Patient Instructions (Signed)
 Consider OTC wax softener such as Cerumenex or Debrox.  Leave in about 30 minutes and rinse with lukewarm water and rubber bulb syringe.   Set up fasting lipid and hepatic panel in about 2-3 weeks.

## 2024-02-16 ENCOUNTER — Encounter (HOSPITAL_COMMUNITY)
Admission: RE | Admit: 2024-02-16 | Discharge: 2024-02-16 | Disposition: A | Discharge status: Home / Self Care | Source: Ambulatory Visit | Attending: Cardiovascular Disease | Admitting: Cardiovascular Disease

## 2024-02-16 DIAGNOSIS — Z955 Presence of coronary angioplasty implant and graft: Secondary | ICD-10-CM | POA: Diagnosis not present

## 2024-02-18 ENCOUNTER — Encounter (HOSPITAL_COMMUNITY)
Admission: RE | Admit: 2024-02-18 | Discharge: 2024-02-18 | Disposition: A | Discharge status: Home / Self Care | Source: Ambulatory Visit | Attending: Cardiovascular Disease | Admitting: Cardiovascular Disease

## 2024-02-18 DIAGNOSIS — Z955 Presence of coronary angioplasty implant and graft: Secondary | ICD-10-CM | POA: Diagnosis not present

## 2024-02-21 ENCOUNTER — Encounter (HOSPITAL_COMMUNITY)
Admission: RE | Admit: 2024-02-21 | Discharge: 2024-02-21 | Disposition: A | Discharge status: Home / Self Care | Source: Ambulatory Visit | Attending: Cardiovascular Disease

## 2024-02-21 DIAGNOSIS — Z955 Presence of coronary angioplasty implant and graft: Secondary | ICD-10-CM | POA: Diagnosis not present

## 2024-02-23 ENCOUNTER — Encounter (HOSPITAL_COMMUNITY)
Admission: RE | Admit: 2024-02-23 | Discharge: 2024-02-23 | Disposition: A | Discharge status: Home / Self Care | Source: Ambulatory Visit | Attending: Cardiovascular Disease

## 2024-02-23 DIAGNOSIS — Z955 Presence of coronary angioplasty implant and graft: Secondary | ICD-10-CM | POA: Diagnosis not present

## 2024-02-25 ENCOUNTER — Encounter (HOSPITAL_COMMUNITY)
Admission: RE | Admit: 2024-02-25 | Discharge: 2024-02-25 | Disposition: A | Discharge status: Home / Self Care | Source: Ambulatory Visit | Attending: Cardiovascular Disease | Admitting: Cardiovascular Disease

## 2024-02-25 DIAGNOSIS — Z955 Presence of coronary angioplasty implant and graft: Secondary | ICD-10-CM | POA: Diagnosis not present

## 2024-02-28 ENCOUNTER — Encounter (HOSPITAL_COMMUNITY)
Admission: RE | Admit: 2024-02-28 | Discharge: 2024-02-28 | Disposition: A | Discharge status: Home / Self Care | Source: Ambulatory Visit | Attending: Cardiovascular Disease | Admitting: Cardiovascular Disease

## 2024-02-28 DIAGNOSIS — Z955 Presence of coronary angioplasty implant and graft: Secondary | ICD-10-CM | POA: Diagnosis not present

## 2024-03-01 ENCOUNTER — Encounter (HOSPITAL_COMMUNITY)
Admission: RE | Admit: 2024-03-01 | Discharge: 2024-03-01 | Disposition: A | Discharge status: Home / Self Care | Source: Ambulatory Visit | Attending: Cardiovascular Disease | Admitting: Cardiovascular Disease

## 2024-03-01 DIAGNOSIS — Z955 Presence of coronary angioplasty implant and graft: Secondary | ICD-10-CM | POA: Diagnosis not present

## 2024-03-03 ENCOUNTER — Telehealth (HOSPITAL_COMMUNITY): Payer: Self-pay

## 2024-03-03 ENCOUNTER — Encounter (HOSPITAL_COMMUNITY)
Admission: RE | Admit: 2024-03-03 | Discharge: 2024-03-03 | Disposition: A | Discharge status: Home / Self Care | Source: Ambulatory Visit | Attending: Cardiovascular Disease | Admitting: Cardiovascular Disease

## 2024-03-03 NOTE — Telephone Encounter (Signed)
 Patient c/o for 1:45pm class due to company from out of town. Will return to class Monday.

## 2024-03-06 ENCOUNTER — Encounter (HOSPITAL_COMMUNITY)
Admission: RE | Admit: 2024-03-06 | Discharge: 2024-03-06 | Disposition: A | Discharge status: Home / Self Care | Source: Ambulatory Visit | Attending: Cardiovascular Disease | Admitting: Cardiovascular Disease

## 2024-03-06 DIAGNOSIS — Z955 Presence of coronary angioplasty implant and graft: Secondary | ICD-10-CM

## 2024-03-07 ENCOUNTER — Ambulatory Visit (INDEPENDENT_AMBULATORY_CARE_PROVIDER_SITE_OTHER)

## 2024-03-07 DIAGNOSIS — M818 Other osteoporosis without current pathological fracture: Secondary | ICD-10-CM | POA: Diagnosis not present

## 2024-03-07 MED ORDER — DENOSUMAB 60 MG/ML ~~LOC~~ SOSY
60.0000 mg | PREFILLED_SYRINGE | Freq: Once | SUBCUTANEOUS | Status: AC
  Administered 2024-03-07: 60 mg via SUBCUTANEOUS

## 2024-03-07 NOTE — Progress Notes (Signed)
 Per orders of Dr. Darren Em, injection of Denosumab  60 mg/ml given by Ulanda Tackett A Kobie Whidby. Patient tolerated injection well.

## 2024-03-08 ENCOUNTER — Encounter (HOSPITAL_COMMUNITY)
Admission: RE | Admit: 2024-03-08 | Discharge: 2024-03-08 | Disposition: A | Discharge status: Home / Self Care | Source: Ambulatory Visit | Attending: Cardiovascular Disease | Admitting: Cardiovascular Disease

## 2024-03-08 DIAGNOSIS — Z955 Presence of coronary angioplasty implant and graft: Secondary | ICD-10-CM

## 2024-03-08 NOTE — Progress Notes (Signed)
 Cardiac Individual Treatment Plan  Patient Details  Name: Shelly [name] MRN: [id number] Date of Birth: 1945-12-24 Referring Provider:   Flowsheet Row INTENSIVE CARDIAC REHAB ORIENT from 02/01/2024 in [facility]  Referring Provider Magnus Schuller, MD       Initial Encounter Date:  Flowsheet Row INTENSIVE CARDIAC REHAB ORIENT from 02/01/2024 in [facility]  Date 02/01/24       Visit Diagnosis: 01/18/24 Status post coronary artery stent placement RCA  Patient's Home Medications on Admission:  Current Outpatient Medications:    acetaminophen  (TYLENOL ) 500 MG tablet, Take 500 mg by mouth every 6 (six) hours as needed for headache or mild pain., Disp: , Rfl:    aspirin  EC 81 MG tablet, Take 1 tablet (81 mg total) by mouth daily. Swallow whole., Disp: 90 tablet, Rfl: 2   calcium -vitamin D  (OSCAL WITH D) 500-5 MG-MCG tablet, Take 1 tablet by mouth daily., Disp: , Rfl:    clidinium-chlordiazePOXIDE  (LIBRAX ) 5-2.5 MG capsule, TAKE 1 CAPSULE EVERY DAY AS NEEDED (Patient taking differently: Take 1 capsule by mouth daily as needed (bowel issue).), Disp: 90 capsule, Rfl: 0   denosumab  (PROLIA ) 60 MG/ML SOSY injection, Inject 60 mg into the skin every 6 (six) months. Last inj was in October, Disp: , Rfl:    loratadine (CLARITIN) 10 MG tablet, Take 10 mg by mouth daily., Disp: , Rfl:    metFORMIN  (GLUCOPHAGE -XR) 500 MG 24 hr tablet, Take 1 tablet (500 mg total) by mouth daily with breakfast., Disp: 30 tablet, Rfl: 0   metoprolol  succinate (TOPROL -XL) 25 MG 24 hr tablet, Take 1 tablet (25 mg total) by mouth daily., Disp: 90 tablet, Rfl: 0   nitroGLYCERIN  (NITROSTAT ) 0.4 MG SL tablet, Place 1 tablet (0.4 mg total) under the tongue every 5 (five) minutes x 3 doses as needed for chest pain., Disp: 25 tablet, Rfl: 2   rosuvastatin  (CRESTOR ) 10 MG tablet, Take 1 tablet (10 mg total) by  mouth daily., Disp: 90 tablet, Rfl: 0   ticagrelor  (BRILINTA ) 90 MG TABS tablet, Take 1 tablet (90 mg total) by mouth 2 (two) times daily., Disp: 180 tablet, Rfl: 2  Past Medical History: Past Medical History:  Diagnosis Date   Allergy    Arthritis    wrist, shoulder    Cancer (HCC) 08/2019   left breast IDC   Diverticulitis    Endometrial polyp    Family history of breast cancer    GERD (gastroesophageal reflux disease)    occasional - diet controlled   Hx of adenomatous colonic polyps    Hyperlipidemia    IBS (irritable bowel syndrome)    Missed abortion    x 1 - resolved - no surgery   Osteoporosis    SVD (spontaneous vaginal delivery)    x 1   Transient vision disturbance 2019   was evaluated by ED, no findings   Vitamin D  deficiency     Tobacco Use: Social History   Tobacco Use  Smoking Status Former   Current packs/day: 0.00   Average packs/day: 0.1 packs/day for 20.0 years (2.0 ttl pk-yrs)   Types: Cigarettes   Start date: 05/15/1987   Quit date: 05/15/2007   Years since quitting: 16.8  Smokeless Tobacco Never    Labs: Review Flowsheet  More data exists      Latest Ref Rng & Units 04/06/2017 10/22/2017 08/05/2018 08/14/2020 01/18/2024  Labs for ITP  Cardiac and Pulmonary Rehab  Cholestrol 0 - 200 mg/dL - - - - 161   LDL (calc) 0 - 99 mg/dL - - - - 096   HDL-C >04 mg/dL - - - - 50   Trlycerides <150 mg/dL - - - - 540   Hemoglobin A1c 4.8 - 5.6 % 6.2  6.3  6.1  6.6  6.7     Capillary Blood Glucose: Lab Results  Component Value Date   GLUCAP 98 02/07/2024   GLUCAP 109 (H) 02/07/2024   GLUCAP 152 (H) 01/19/2024   GLUCAP 129 (H) 01/18/2024   GLUCAP 98 01/18/2024     Exercise Target Goals: Exercise Program Goal: Individual exercise prescription set using results from initial 6 min walk test and THRR while considering  patient's activity barriers and safety.   Exercise Prescription Goal: Initial exercise prescription builds to 30-45 minutes a day of  aerobic activity, 2-3 days per week.  Home exercise guidelines will be given to patient during program as part of exercise prescription that the participant will acknowledge.  Activity Barriers & Risk Stratification:  Activity Barriers & Cardiac Risk Stratification - 02/01/24 1158       Activity Barriers & Cardiac Risk Stratification   Activity Barriers Arthritis;Back Problems;Joint Problems;Deconditioning;Neck/Spine Problems;Balance Concerns    Cardiac Risk Stratification High             6 Minute Walk:  6 Minute Walk     Row Name 02/01/24 1130         6 Minute Walk   Phase Initial     Distance 1256 feet     Walk Time 6 minutes     # of Rest Breaks 0     MPH 2.38     METS 2.01     RPE 9     Perceived Dyspnea  0     VO2 Peak 7.1     Symptoms Yes (comment)     Comments chronic low back pain 3/10     Resting HR 62 bpm     Resting BP 128/60     Resting Oxygen Saturation  93 %     Exercise Oxygen Saturation  during 6 min walk 96 %     Max Ex. HR 76 bpm     Max Ex. BP 156/70     2 Minute Post BP 130/78              Oxygen Initial Assessment:   Oxygen Re-Evaluation:   Oxygen Discharge (Final Oxygen Re-Evaluation):   Initial Exercise Prescription:  Initial Exercise Prescription - 02/01/24 1200       Date of Initial Exercise RX and Referring Provider   Date 02/01/24    Referring Provider Magnus Schuller, MD    Expected Discharge Date 04/26/24      NuStep   Level 1    SPM 75    Minutes 30    METs 2      Prescription Details   Frequency (times per week) 3    Duration Progress to 30 minutes of continuous aerobic without signs/symptoms of physical distress      Intensity   THRR 40-80% of Max Heartrate 56-113    Ratings of Perceived Exertion 11-13    Perceived Dyspnea 0-4      Progression   Progression Continue progressive overload as per policy without signs/symptoms or physical distress.      Resistance Training   Training Prescription Yes     Weight 2 lbs  Reps 10-15             Perform Capillary Blood Glucose checks as needed.  Exercise Prescription Changes:   Exercise Prescription Changes     Row Name 02/07/24 1600 02/21/24 1630           Response to Exercise   Blood Pressure (Admit) 124/70 132/64      Blood Pressure (Exercise) 140/56 144/62      Blood Pressure (Exit) 104/72 118/64      Heart Rate (Admit) 60 bpm 58 bpm      Heart Rate (Exercise) 72 bpm 83 bpm      Heart Rate (Exit) 66 bpm 59 bpm      Rating of Perceived Exertion (Exercise) 11 11.5      Perceived Dyspnea (Exercise) 0 0      Symptoms 6/10 Right arm pain --      Comments Pt first day in CRP2 program Reviewed MET's and goals      Duration Progress to 30 minutes of  aerobic without signs/symptoms of physical distress Progress to 30 minutes of  aerobic without signs/symptoms of physical distress      Intensity -- THRR unchanged        Progression   Progression Continue to progress workloads to maintain intensity without signs/symptoms of physical distress. Continue to progress workloads to maintain intensity without signs/symptoms of physical distress.      Average METs 1.5 2.41        Resistance Training   Training Prescription Yes Yes      Weight 2 2      Reps 10-15 10-15      Time -- 10 Minutes        Interval Training   Interval Training No No        NuStep   Level 1 1      SPM 55 73      Minutes 19 15      METs 1.5 1.9        Track   Laps -- 15      Minutes -- 15      METs -- 2.91               Exercise Comments:   Exercise Comments     Row Name 02/21/24 1630           Exercise Comments Reviewed MET's and goals. Pt tolerated exercise well with an average MET level of 2.41. She is feeling much stronger and has increased to 30 mins exercise on two modalities. Shes doing great with walking the track and is increasing her balance.                Exercise Goals and Review:   Exercise Goals     Row Name  02/01/24 1054             Exercise Goals   Increase Physical Activity Yes       Intervention Provide advice, education, support and counseling about physical activity/exercise needs.;Develop an individualized exercise prescription for aerobic and resistive training based on initial evaluation findings, risk stratification, comorbidities and participant's personal goals.       Expected Outcomes Short Term: Attend rehab on a regular basis to increase amount of physical activity.;Long Term: Add in home exercise to make exercise part of routine and to increase amount of physical activity.;Long Term: Exercising regularly at least 3-5 days a week.       Increase Strength and Stamina  Yes       Intervention Provide advice, education, support and counseling about physical activity/exercise needs.;Develop an individualized exercise prescription for aerobic and resistive training based on initial evaluation findings, risk stratification, comorbidities and participant's personal goals.       Expected Outcomes Short Term: Increase workloads from initial exercise prescription for resistance, speed, and METs.;Short Term: Perform resistance training exercises routinely during rehab and add in resistance training at home;Long Term: Improve cardiorespiratory fitness, muscular endurance and strength as measured by increased METs and functional capacity ( )       Able to understand and use rate of perceived exertion (RPE) scale Yes       Intervention Provide education and explanation on how to use RPE scale       Expected Outcomes Short Term: Able to use RPE daily in rehab to express subjective intensity level;Long Term:  Able to use RPE to guide intensity level when exercising independently       Knowledge and understanding of Target Heart Rate Range (THRR) Yes       Intervention Provide education and explanation of THRR including how the numbers were predicted and where they are located for reference        Expected Outcomes Short Term: Able to state/look up THRR;Long Term: Able to use THRR to govern intensity when exercising independently;Short Term: Able to use daily as guideline for intensity in rehab       Understanding of Exercise Prescription Yes       Intervention Provide education, explanation, and written materials on patient's individual exercise prescription       Expected Outcomes Short Term: Able to explain program exercise prescription;Long Term: Able to explain home exercise prescription to exercise independently                Exercise Goals Re-Evaluation :  Exercise Goals Re-Evaluation     Row Name 02/07/24 1649 02/21/24 1630           Exercise Goal Re-Evaluation   Exercise Goals Review Increase Physical Activity;Understanding of Exercise Prescription;Increase Strength and Stamina;Knowledge and understanding of Target Heart Rate Range (THRR);Able to understand and use rate of perceived exertion (RPE) scale Increase Physical Activity;Understanding of Exercise Prescription;Increase Strength and Stamina;Knowledge and understanding of Target Heart Rate Range (THRR);Able to understand and use rate of perceived exertion (RPE) scale      Comments Pt first day in CRP2 program. Pt tolerated Nustep for 19 min and stopped due to 6/10 R arm pain, no other s/sx reported. Pt avg METs 1.5. Pt is learning her RPE scale, THRR, and ExRx. Reviewed MET's and goals. Pt tolerated exercise well with an average MET level of 2.41. She is feeling much stronger and has increased to 30 mins exercise on two modalities. Shes doing great with walking the track and is increasing her balance.      Expected Outcomes Will continue to progress workloads as tolerated without s/sx. Will continue to progress workloads as tolerated without s/sx.               Discharge Exercise Prescription (Final Exercise Prescription Changes):  Exercise Prescription Changes - 02/21/24 1630       Response to Exercise    Blood Pressure (Admit) 132/64    Blood Pressure (Exercise) 144/62    Blood Pressure (Exit) 118/64    Heart Rate (Admit) 58 bpm    Heart Rate (Exercise) 83 bpm    Heart Rate (Exit) 59 bpm    Rating of Perceived  Exertion (Exercise) 11.5    Perceived Dyspnea (Exercise) 0    Comments Reviewed MET's and goals    Duration Progress to 30 minutes of  aerobic without signs/symptoms of physical distress    Intensity THRR unchanged      Progression   Progression Continue to progress workloads to maintain intensity without signs/symptoms of physical distress.    Average METs 2.41      Resistance Training   Training Prescription Yes    Weight 2    Reps 10-15    Time 10 Minutes      Interval Training   Interval Training No      NuStep   Level 1    SPM 73    Minutes 15    METs 1.9      Track   Laps 15    Minutes 15    METs 2.91             Nutrition:  Target Goals: Understanding of nutrition guidelines, daily intake of sodium 1500mg , cholesterol 200mg , calories 30% from fat and 7% or less from saturated fats, daily to have 5 or more servings of fruits and vegetables.  Biometrics:  Pre Biometrics - 02/01/24 1048       Pre Biometrics   Waist Circumference 43.5 inches    Hip Circumference 43 inches    Waist to Hip Ratio 1.01 %    Triceps Skinfold 23 mm    % Body Fat 43.4 %    Grip Strength 18 kg    Flexibility --   Not performed 4/10 low back pain today   Single Leg Stand 11.5 seconds              Nutrition Therapy Plan and Nutrition Goals:  Nutrition Therapy & Goals - 03/06/24 1438       Nutrition Therapy   Diet Heart Healthy Diet    Drug/Food Interactions Statins/Certain Fruits      Personal Nutrition Goals   Nutrition Goal Patient to identify strategies for reducing cardiovascular risk by attending the [facility] education and nutrition series weekly.   goal in action   Personal Goal #2 Patient to improve diet quality by using the plate method as a guide  for meal planning to include lean protein/plant protein, fruits, vegetables, whole grains, nonfat dairy as part of a well-balanced diet.   goal in action.   Comments Goals in action. Shelly [name] has medical history of CAD, HTN, DM2,s/p coronary artery stent placement. Shelly [name] reports that she has started making some dietary changes including decreased saturated fat intake and increased omega 3 rich fish intake. She has decreased refined carbohydrate intake.She is attending the [facility] education series regularly. LDL remains elevated; she is scheduled to see the lipid clinic. A1c is at goal <7%. She is down 1.1# since starting with our program. Patient will benefit from participation in intensive cardiac rehab for nutrition, exercise, and lifestyle modification.      Intervention Plan   Intervention Prescribe, educate and counsel regarding individualized specific dietary modifications aiming towards targeted core components such as weight, hypertension, lipid management, diabetes, heart failure and other comorbidities.;Nutrition handout(s) given to patient.    Expected Outcomes Short Term Goal: Understand basic principles of dietary content, such as calories, fat, sodium, cholesterol and nutrients.;Long Term Goal: Adherence to prescribed nutrition plan.             Nutrition Assessments:  Nutrition Assessments - 02/07/24 1604       Rate Your Plate Scores  Pre Score 64            MEDIFICTS Score Key: >=70 Need to make dietary changes  40-70 Heart Healthy Diet <= 40 Therapeutic Level Cholesterol Diet   Flowsheet Row INTENSIVE CARDIAC REHAB from 02/07/2024 in [facility]  Picture Your Plate Total Score on Admission 64      Picture Your Plate Scores: <16 Unhealthy dietary pattern with much room for improvement. 41-50 Dietary pattern unlikely to meet recommendations for good health and room for improvement. 51-60 More healthful dietary  pattern, with some room for improvement.  >60 Healthy dietary pattern, although there may be some specific behaviors that could be improved.    Nutrition Goals Re-Evaluation:  Nutrition Goals Re-Evaluation     Row Name 02/07/24 1600 03/06/24 1438           Goals   Current Weight 187 lb 13.3 oz (85.2 kg) 186 lb 11.7 oz (84.7 kg)      Comment A1c 6.7, Lpa 154, cholesterol 266, triglycerides 155, LDL 185 no new labs; most recent labs A1c 6.7, Lpa 154, cholesterol 266, triglycerides 155, LDL 185      Expected Outcome Shelly [name] has medical history of CAD, HTN, DM2,s/p coronary artery stent placement. Shelly [name] reports that she has started making some dietary changes including decreased saturated fat intake and increased omega 3 rich fish intake. She reports lengthy history of IBS and is apprehensive to attending the [facility] nutrition classes. LDL remains elevated; she is scheduled to see the lipid clinic. A1c is <7%. Patient will benefit from participation in intensive cardiac rehab for nutrition, exercise, and lifestyle modification. Goals in action. Shelly [name] has medical history of CAD, HTN, DM2,s/p coronary artery stent placement. Shelly [name] reports that she has started making some dietary changes including decreased saturated fat intake and increased omega 3 rich fish intake. She has decreased refined carbohydrate intake.She is attending the [facility] education series regularly. LDL remains elevated; she is scheduled to see the lipid clinic. A1c is at goal <7%. She is down 1.1# since starting with our program. Patient will benefit from participation in intensive cardiac rehab for nutrition, exercise, and lifestyle modification.               Nutrition Goals Re-Evaluation:  Nutrition Goals Re-Evaluation     Row Name 02/07/24 1600 03/06/24 1438           Goals   Current Weight 187 lb 13.3 oz (85.2 kg) 186 lb 11.7 oz (84.7 kg)      Comment A1c 6.7, Lpa 154, cholesterol 266, triglycerides 155, LDL 185 no new labs;  most recent labs A1c 6.7, Lpa 154, cholesterol 266, triglycerides 155, LDL 185      Expected Outcome Shelly [name] has medical history of CAD, HTN, DM2,s/p coronary artery stent placement. Shelly [name] reports that she has started making some dietary changes including decreased saturated fat intake and increased omega 3 rich fish intake. She reports lengthy history of IBS and is apprehensive to attending the [facility] nutrition classes. LDL remains elevated; she is scheduled to see the lipid clinic. A1c is <7%. Patient will benefit from participation in intensive cardiac rehab for nutrition, exercise, and lifestyle modification. Goals in action. Shelly [name] has medical history of CAD, HTN, DM2,s/p coronary artery stent placement. Shelly [name] reports that she has started making some dietary changes including decreased saturated fat intake and increased omega 3 rich fish intake. She has decreased refined carbohydrate intake.She is attending the [facility]  education series regularly. LDL remains elevated; she is scheduled to see the lipid clinic. A1c is at goal <7%. She is down 1.1# since starting with our program. Patient will benefit from participation in intensive cardiac rehab for nutrition, exercise, and lifestyle modification.               Nutrition Goals Discharge (Final Nutrition Goals Re-Evaluation):  Nutrition Goals Re-Evaluation - 03/06/24 1438       Goals   Current Weight 186 lb 11.7 oz (84.7 kg)    Comment no new labs; most recent labs A1c 6.7, Lpa 154, cholesterol 266, triglycerides 155, LDL 185    Expected Outcome Goals in action. Shelly [name] has medical history of CAD, HTN, DM2,s/p coronary artery stent placement. Shelly [name] reports that she has started making some dietary changes including decreased saturated fat intake and increased omega 3 rich fish intake. She has decreased refined carbohydrate intake.She is attending the [facility] education series regularly. LDL remains elevated; she is scheduled to see the lipid clinic. A1c is at goal  <7%. She is down 1.1# since starting with our program. Patient will benefit from participation in intensive cardiac rehab for nutrition, exercise, and lifestyle modification.             Psychosocial: Target Goals: Acknowledge presence or absence of significant depression and/or stress, maximize coping skills, provide positive support system. Participant is able to verbalize types and ability to use techniques and skills needed for reducing stress and depression.  Initial Review & Psychosocial Screening:  Initial Psych Review & Screening - 02/01/24 1205       Initial Review   Current issues with Current Sleep Concerns      Family Dynamics   Good Support System? Yes   Pt has spouse at home for support     Barriers   Psychosocial barriers to participate in program There are no identifiable barriers or psychosocial needs.      Screening Interventions   Interventions Encouraged to exercise             Quality of Life Scores:  Quality of Life - 02/01/24 1328       Quality of Life   Select Quality of Life      Quality of Life Scores   Health/Function Pre 25.33 %    Socioeconomic Pre 21.79 %    Psych/Spiritual Pre 26.29 %    Family Pre 28.3 %    GLOBAL Pre 25.24 %            Scores of 19 and below usually indicate a poorer quality of life in these areas.  A difference of  2-3 points is a clinically meaningful difference.  A difference of 2-3 points in the total score of the Quality of Life Index has been associated with significant improvement in overall quality of life, self-image, physical symptoms, and general health in studies assessing change in quality of life.  PHQ-9: Review Flowsheet  More data exists      02/01/2024 08/23/2023 11/24/2022 08/17/2022 01/12/2022  Depression screen PHQ 2/9  Decreased Interest 0 0 0 0 0  Down, Depressed, Hopeless 0 0 0 0 0  PHQ - 2 Score 0 0 0 0 0  Altered sleeping 2 - - - -  Tired, decreased energy 1 - - - -  Change in  appetite 0 - - - -  Feeling bad or failure about yourself  0 - - - -  Trouble concentrating 0 - - - -  Moving slowly or fidgety/restless 0 - - - -  Suicidal thoughts 0 - - - -  PHQ-9 Score 3 - - - -  Difficult doing work/chores Somewhat difficult - - - -   Interpretation of Total Score  Total Score Depression Severity:  1-4 = Minimal depression, 5-9 = Mild depression, 10-14 = Moderate depression, 15-19 = Moderately severe depression, 20-27 = Severe depression   Psychosocial Evaluation and Intervention:   Psychosocial Re-Evaluation:  Psychosocial Re-Evaluation     Row Name 02/07/24 1638 03/08/24 1259           Psychosocial Re-Evaluation   Current issues with Current Sleep Concerns;Current Anxiety/Panic Current Sleep Concerns;Current Anxiety/Panic      Comments Shelly [name] said she was nervous about starting exercise but felt better after exercise was over. Will discuss PHQ 9 in the upcoming week Reviewed PHQ-9 Shelly [name] says she has ongoing issues with insomnia. Shelly [name] says that her energy level has improved since she has been participaitng in cardiac rehab.      Expected Outcomes Shelly [name] will have decreased or controlled anxiety and better sleepipng habits upon completion of cardiac rehab. Shelly [name] will have decreased or controlled anxiety and better sleepipng habits upon completion of cardiac rehab.      Interventions Stress management education;Relaxation education;Encouraged to attend Cardiac Rehabilitation for the exercise Stress management education;Relaxation education;Encouraged to attend Cardiac Rehabilitation for the exercise      Continue Psychosocial Services  Follow up required by staff Follow up required by staff               Psychosocial Discharge (Final Psychosocial Re-Evaluation):  Psychosocial Re-Evaluation - 03/08/24 1259       Psychosocial Re-Evaluation   Current issues with Current Sleep Concerns;Current Anxiety/Panic    Comments Reviewed PHQ-9 Shelly [name] says she has ongoing issues  with insomnia. Shelly [name] says that her energy level has improved since she has been participaitng in cardiac rehab.    Expected Outcomes Shelly [name] will have decreased or controlled anxiety and better sleepipng habits upon completion of cardiac rehab.    Interventions Stress management education;Relaxation education;Encouraged to attend Cardiac Rehabilitation for the exercise    Continue Psychosocial Services  Follow up required by staff             Vocational Rehabilitation: Provide vocational rehab assistance to qualifying candidates.   Vocational Rehab Evaluation & Intervention:  Vocational Rehab - 02/01/24 1206       Initial Vocational Rehab Evaluation & Intervention   Assessment shows need for Vocational Rehabilitation No   Pt is retired            Education: Education Goals: Education classes will be provided on a weekly basis, covering required topics. Participant will state understanding/return demonstration of topics presented.    Education     Row Name 02/07/24 1600     Education   Cardiac Education Topics Pritikin   Geographical information systems officer Psychosocial   Psychosocial Workshop Focused Goals, Sustainable Changes   Instruction Review Code 1- Verbalizes Understanding   Class Start Time 1345   Class Stop Time 1443   Class Time Calculation (min) 58 min    Row Name 02/09/24 1600     Education   Cardiac Education Topics Pritikin   Customer service manager   Weekly Topic Comforting Weekend Breakfasts   Instruction Review Code 1- Verbalizes Understanding   Class  Start Time 1400   Class Stop Time 1440   Class Time Calculation (min) 40 min    Row Name 02/11/24 1500     Education   Cardiac Education Topics Pritikin   Licensed conveyancer Nutrition   Nutrition Dining Out - Part 1   Instruction Review Code 1- Verbalizes Understanding    Class Start Time 1400   Class Stop Time 1440   Class Time Calculation (min) 40 min    Row Name 02/16/24 1500     Education   Cardiac Education Topics Pritikin   Customer service manager   Weekly Topic Fast Evening Meals   Instruction Review Code 1- Verbalizes Understanding   Class Start Time 1400   Class Stop Time 1440   Class Time Calculation (min) 40 min    Row Name 02/18/24 1400     Education   Cardiac Education Topics Pritikin   Nurse, children's   Educator Dietitian   Nutrition Vitamins and Minerals   Instruction Review Code 1- Verbalizes Understanding   Class Start Time 1400   Class Stop Time 1440   Class Time Calculation (min) 40 min    Row Name 02/21/24 1500     Education   Cardiac Education Topics Pritikin   Glass blower/designer Nutrition   Nutrition Workshop Fueling a Forensic psychologist   Instruction Review Code 1- Teaching laboratory technician Start Time 1400   Class Stop Time 1440   Class Time Calculation (min) 40 min    Row Name 02/23/24 1500     Education   Cardiac Education Topics Pritikin   Customer service manager   Weekly Topic International Cuisine- Spotlight on the [employer] Zones   Instruction Review Code 1- Verbalizes Understanding   Class Start Time 1400   Class Stop Time 1440   Class Time Calculation (min) 40 min    Row Name 02/25/24 1400     Education   Cardiac Education Topics Pritikin   Psychologist, forensic Exercise Education   Exercise Education Improving Performance   Instruction Review Code 1- Verbalizes Understanding   Class Start Time 1400   Class Stop Time 1435   Class Time Calculation (min) 35 min    Row Name 02/28/24 1600     Education   Cardiac Education Topics Pritikin   [employer] Workshops     Workshops   Educator  Exercise Physiologist   [employer] Psychosocial   Psychosocial Workshop Healthy Sleep for a Healthy Heart   Instruction Review Code 1- Verbalizes Understanding   Class Start Time 1403   Class Stop Time 1457   Class Time Calculation (min) 54 min    Row Name 03/01/24 1400     Education   Cardiac Education Topics Pritikin   Customer service manager   Weekly Topic Simple Sides and Sauces   Instruction Review Code 1- Verbalizes Understanding   Class Start Time 1355   Class Stop Time 1431   Class Time Calculation (min) 36 min    Row Name 03/06/24 1500     Education  Cardiac Education Topics Pritikin   Geographical information systems officer Exercise   Exercise Workshop Managing Heart Disease: Your Path to a Healthier Heart   Instruction Review Code 1- Verbalizes Understanding   Class Start Time 1400   Class Stop Time 1448   Class Time Calculation (min) 48 min            Core Videos: Exercise    Move It!  Clinical staff conducted group or individual video education with verbal and written material and guidebook.  Patient learns the recommended Pritikin exercise program. Exercise with the goal of living a long, healthy life. Some of the health benefits of exercise include controlled diabetes, healthier blood pressure levels, improved cholesterol levels, improved heart and lung capacity, improved sleep, and better body composition. Everyone should speak with their doctor before starting or changing an exercise routine.  Biomechanical Limitations Clinical staff conducted group or individual video education with verbal and written material and guidebook.  Patient learns how biomechanical limitations can impact exercise and how we can mitigate and possibly overcome limitations to have an impactful and balanced exercise routine.  Body Composition Clinical staff conducted group or individual video education  with verbal and written material and guidebook.  Patient learns that body composition (ratio of muscle mass to fat mass) is a key component to assessing overall fitness, rather than body weight alone. Increased fat mass, especially visceral belly fat, can put us  at increased risk for metabolic syndrome, type 2 diabetes, heart disease, and even death. It is recommended to combine diet and exercise (cardiovascular and resistance training) to improve your body composition. Seek guidance from your physician and exercise physiologist before implementing an exercise routine.  Exercise Action Plan Clinical staff conducted group or individual video education with verbal and written material and guidebook.  Patient learns the recommended strategies to achieve and enjoy long-term exercise adherence, including variety, self-motivation, self-efficacy, and positive decision making. Benefits of exercise include fitness, good health, weight management, more energy, better sleep, less stress, and overall well-being.  Medical   Heart Disease Risk Reduction Clinical staff conducted group or individual video education with verbal and written material and guidebook.  Patient learns our heart is our most vital organ as it circulates oxygen, nutrients, white blood cells, and hormones throughout the entire body, and carries waste away. Data supports a plant-based eating plan like the Pritikin Program for its effectiveness in slowing progression of and reversing heart disease. The video provides a number of recommendations to address heart disease.   Metabolic Syndrome and Belly Fat  Clinical staff conducted group or individual video education with verbal and written material and guidebook.  Patient learns what metabolic syndrome is, how it leads to heart disease, and how one can reverse it and keep it from coming back. You have metabolic syndrome if you have 3 of the following 5 criteria: abdominal obesity, high blood  pressure, high triglycerides, low HDL cholesterol, and high blood sugar.  Hypertension and Heart Disease Clinical staff conducted group or individual video education with verbal and written material and guidebook.  Patient learns that high blood pressure, or hypertension, is very common in the [place] . Hypertension is largely due to excessive salt intake, but other important risk factors include being overweight, physical inactivity, drinking too much alcohol, smoking, and not eating enough potassium from fruits and vegetables. High blood pressure is a leading risk factor for heart attack, stroke, congestive heart failure,  dementia, kidney failure, and premature death. Long-term effects of excessive salt intake include stiffening of the arteries and thickening of heart muscle and organ damage. Recommendations include ways to reduce hypertension and the risk of heart disease.  Diseases of Our Time - Focusing on Diabetes Clinical staff conducted group or individual video education with verbal and written material and guidebook.  Patient learns why the best way to stop diseases of our time is prevention, through food and other lifestyle changes. Medicine (such as prescription pills and surgeries) is often only a Band-Aid on the problem, not a long-term solution. Most common diseases of our time include obesity, type 2 diabetes, hypertension, heart disease, and cancer. The Pritikin Program is recommended and has been proven to help reduce, reverse, and/or prevent the damaging effects of metabolic syndrome.  Nutrition   Overview of the Pritikin Eating Plan  Clinical staff conducted group or individual video education with verbal and written material and guidebook.  Patient learns about the Pritikin Eating Plan for disease risk reduction. The Pritikin Eating Plan emphasizes a wide variety of unrefined, minimally-processed carbohydrates, like fruits, vegetables, whole grains, and legumes. Go, Caution,  and Stop food choices are explained. Plant-based and lean animal proteins are emphasized. Rationale provided for low sodium intake for blood pressure control, low added sugars for blood sugar stabilization, and low added fats and oils for coronary artery disease risk reduction and weight management.  Calorie Density  Clinical staff conducted group or individual video education with verbal and written material and guidebook.  Patient learns about calorie density and how it impacts the Pritikin Eating Plan. Knowing the characteristics of the food you choose will help you decide whether those foods will lead to weight gain or weight loss, and whether you want to consume more or less of them. Weight loss is usually a side effect of the Pritikin Eating Plan because of its focus on low calorie-dense foods.  Label Reading  Clinical staff conducted group or individual video education with verbal and written material and guidebook.  Patient learns about the Pritikin recommended label reading guidelines and corresponding recommendations regarding calorie density, added sugars, sodium content, and whole grains.  Dining Out - Part 1  Clinical staff conducted group or individual video education with verbal and written material and guidebook.  Patient learns that restaurant meals can be sabotaging because they can be so high in calories, fat, sodium, and/or sugar. Patient learns recommended strategies on how to positively address this and avoid unhealthy pitfalls.  Facts on Fats  Clinical staff conducted group or individual video education with verbal and written material and guidebook.  Patient learns that lifestyle modifications can be just as effective, if not more so, as many medications for lowering your risk of heart disease. A Pritikin lifestyle can help to reduce your risk of inflammation and atherosclerosis (cholesterol build-up, or plaque, in the artery walls). Lifestyle interventions such as dietary  choices and physical activity address the cause of atherosclerosis. A review of the types of fats and their impact on blood cholesterol levels, along with dietary recommendations to reduce fat intake is also included.  Nutrition Action Plan  Clinical staff conducted group or individual video education with verbal and written material and guidebook.  Patient learns how to incorporate Pritikin recommendations into their lifestyle. Recommendations include planning and keeping personal health goals in mind as an important part of their success.  Healthy Mind-Set    Healthy Minds, Bodies, Hearts  Clinical staff conducted group or individual  video education with verbal and written material and guidebook.  Patient learns how to identify when they are stressed. Video will discuss the impact of that stress, as well as the many benefits of stress management. Patient will also be introduced to stress management techniques. The way we think, act, and feel has an impact on our hearts.  How Our Thoughts Can Heal Our Hearts  Clinical staff conducted group or individual video education with verbal and written material and guidebook.  Patient learns that negative thoughts can cause depression and anxiety. This can result in negative lifestyle behavior and serious health problems. Cognitive behavioral therapy is an effective method to help control our thoughts in order to change and improve our emotional outlook.  Additional Videos:  Exercise    Improving Performance  Clinical staff conducted group or individual video education with verbal and written material and guidebook.  Patient learns to use a non-linear approach by alternating intensity levels and lengths of time spent exercising to help burn more calories and lose more body fat. Cardiovascular exercise helps improve heart health, metabolism, hormonal balance, blood sugar control, and recovery from fatigue. Resistance training improves strength, endurance,  balance, coordination, reaction time, metabolism, and muscle mass. Flexibility exercise improves circulation, posture, and balance. Seek guidance from your physician and exercise physiologist before implementing an exercise routine and learn your capabilities and proper form for all exercise.  Introduction to Yoga  Clinical staff conducted group or individual video education with verbal and written material and guidebook.  Patient learns about yoga, a discipline of the coming together of mind, breath, and body. The benefits of yoga include improved flexibility, improved range of motion, better posture and core strength, increased lung function, weight loss, and positive self-image. Yoga's heart health benefits include lowered blood pressure, healthier heart rate, decreased cholesterol and triglyceride levels, improved immune function, and reduced stress. Seek guidance from your physician and exercise physiologist before implementing an exercise routine and learn your capabilities and proper form for all exercise.  Medical   Aging: Enhancing Your Quality of Life  Clinical staff conducted group or individual video education with verbal and written material and guidebook.  Patient learns key strategies and recommendations to stay in good physical health and enhance quality of life, such as prevention strategies, having an advocate, securing a Health Care Proxy and Power of Attorney, and keeping a list of medications and system for tracking them. It also discusses how to avoid risk for bone loss.  Biology of Weight Control  Clinical staff conducted group or individual video education with verbal and written material and guidebook.  Patient learns that weight gain occurs because we consume more calories than we burn (eating more, moving less). Even if your body weight is normal, you may have higher ratios of fat compared to muscle mass. Too much body fat puts you at increased risk for cardiovascular disease,  heart attack, stroke, type 2 diabetes, and obesity-related cancers. In addition to exercise, following the Pritikin Eating Plan can help reduce your risk.  Decoding Lab Results  Clinical staff conducted group or individual video education with verbal and written material and guidebook.  Patient learns that lab test reflects one measurement whose values change over time and are influenced by many factors, including medication, stress, sleep, exercise, food, hydration, pre-existing medical conditions, and more. It is recommended to use the knowledge from this video to become more involved with your lab results and evaluate your numbers to speak with your doctor.  Diseases of Our Time - Overview  Clinical staff conducted group or individual video education with verbal and written material and guidebook.  Patient learns that according to the CDC, 50% to 70% of chronic diseases (such as obesity, type 2 diabetes, elevated lipids, hypertension, and heart disease) are avoidable through lifestyle improvements including healthier food choices, listening to satiety cues, and increased physical activity.  Sleep Disorders Clinical staff conducted group or individual video education with verbal and written material and guidebook.  Patient learns how good quality and duration of sleep are important to overall health and well-being. Patient also learns about sleep disorders and how they impact health along with recommendations to address them, including discussing with a physician.  Nutrition  Dining Out - Part 2 Clinical staff conducted group or individual video education with verbal and written material and guidebook.  Patient learns how to plan ahead and communicate in order to maximize their dining experience in a healthy and nutritious manner. Included are recommended food choices based on the type of restaurant the patient is visiting.   Fueling a Banker conducted group or  individual video education with verbal and written material and guidebook.  There is a strong connection between our food choices and our health. Diseases like obesity and type 2 diabetes are very prevalent and are in large-part due to lifestyle choices. The Pritikin Eating Plan provides plenty of food and hunger-curbing satisfaction. It is easy to follow, affordable, and helps reduce health risks.  Menu Workshop  Clinical staff conducted group or individual video education with verbal and written material and guidebook.  Patient learns that restaurant meals can sabotage health goals because they are often packed with calories, fat, sodium, and sugar. Recommendations include strategies to plan ahead and to communicate with the manager, chef, or server to help order a healthier meal.  Planning Your Eating Strategy  Clinical staff conducted group or individual video education with verbal and written material and guidebook.  Patient learns about the Pritikin Eating Plan and its benefit of reducing the risk of disease. The Pritikin Eating Plan does not focus on calories. Instead, it emphasizes high-quality, nutrient-rich foods. By knowing the characteristics of the foods, we choose, we can determine their calorie density and make informed decisions.  Targeting Your Nutrition Priorities  Clinical staff conducted group or individual video education with verbal and written material and guidebook.  Patient learns that lifestyle habits have a tremendous impact on disease risk and progression. This video provides eating and physical activity recommendations based on your personal health goals, such as reducing LDL cholesterol, losing weight, preventing or controlling type 2 diabetes, and reducing high blood pressure.  Vitamins and Minerals  Clinical staff conducted group or individual video education with verbal and written material and guidebook.  Patient learns different ways to obtain key vitamins and  minerals, including through a recommended healthy diet. It is important to discuss all supplements you take with your doctor.   Healthy Mind-Set    Smoking Cessation  Clinical staff conducted group or individual video education with verbal and written material and guidebook.  Patient learns that cigarette smoking and tobacco addiction pose a serious health risk which affects millions of people. Stopping smoking will significantly reduce the risk of heart disease, lung disease, and many forms of cancer. Recommended strategies for quitting are covered, including working with your doctor to develop a successful plan.  Culinary   Becoming a Set designer conducted group or  individual video education with verbal and written material and guidebook.  Patient learns that cooking at home can be healthy, cost-effective, quick, and puts them in control. Keys to cooking healthy recipes will include looking at your recipe, assessing your equipment needs, planning ahead, making it simple, choosing cost-effective seasonal ingredients, and limiting the use of added fats, salts, and sugars.  Cooking - Breakfast and Snacks  Clinical staff conducted group or individual video education with verbal and written material and guidebook.  Patient learns how important breakfast is to satiety and nutrition through the entire day. Recommendations include key foods to eat during breakfast to help stabilize blood sugar levels and to prevent overeating at meals later in the day. Planning ahead is also a key component.  Cooking - Educational psychologist conducted group or individual video education with verbal and written material and guidebook.  Patient learns eating strategies to improve overall health, including an approach to cook more at home. Recommendations include thinking of animal protein as a side on your plate rather than center stage and focusing instead on lower calorie dense options like  vegetables, fruits, whole grains, and plant-based proteins, such as beans. Making sauces in large quantities to freeze for later and leaving the skin on your vegetables are also recommended to maximize your experience.  Cooking - Healthy Salads and Dressing Clinical staff conducted group or individual video education with verbal and written material and guidebook.  Patient learns that vegetables, fruits, whole grains, and legumes are the foundations of the Pritikin Eating Plan. Recommendations include how to incorporate each of these in flavorful and healthy salads, and how to create homemade salad dressings. Proper handling of ingredients is also covered. Cooking - Soups and [employer] - Soups and Desserts Clinical staff conducted group or individual video education with verbal and written material and guidebook.  Patient learns that Pritikin soups and desserts make for easy, nutritious, and delicious snacks and meal components that are low in sodium, fat, sugar, and calorie density, while high in vitamins, minerals, and filling fiber. Recommendations include simple and healthy ideas for soups and desserts.   Overview     The Pritikin Solution Program Overview Clinical staff conducted group or individual video education with verbal and written material and guidebook.  Patient learns that the results of the Pritikin Program have been documented in more than 100 articles published in peer-reviewed journals, and the benefits include reducing risk factors for (and, in some cases, even reversing) high cholesterol, high blood pressure, type 2 diabetes, obesity, and more! An overview of the three key pillars of the Pritikin Program will be covered: eating well, doing regular exercise, and having a healthy mind-set.  WORKSHOPS  Exercise: Exercise Basics: Building Your Action Plan Clinical staff led group instruction and group discussion with PowerPoint presentation and patient guidebook. To enhance  the learning environment the use of posters, models and videos may be added. At the conclusion of this workshop, patients will comprehend the difference between physical activity and exercise, as well as the benefits of incorporating both, into their routine. Patients will understand the FITT (Frequency, Intensity, Time, and Type) principle and how to use it to build an exercise action plan. In addition, safety concerns and other considerations for exercise and cardiac rehab will be addressed by the presenter. The purpose of this lesson is to promote a comprehensive and effective weekly exercise routine in order to improve patients' overall level of fitness.   Managing Heart Disease:  Your Path to a Healthier Heart Clinical staff led group instruction and group discussion with PowerPoint presentation and patient guidebook. To enhance the learning environment the use of posters, models and videos may be added.At the conclusion of this workshop, patients will understand the anatomy and physiology of the heart. Additionally, they will understand how Pritikin's three pillars impact the risk factors, the progression, and the management of heart disease.  The purpose of this lesson is to provide a high-level overview of the heart, heart disease, and how the Pritikin lifestyle positively impacts risk factors.  Exercise Biomechanics Clinical staff led group instruction and group discussion with PowerPoint presentation and patient guidebook. To enhance the learning environment the use of posters, models and videos may be added. Patients will learn how the structural parts of their bodies function and how these functions impact their daily activities, movement, and exercise. Patients will learn how to promote a neutral spine, learn how to manage pain, and identify ways to improve their physical movement in order to promote healthy living. The purpose of this lesson is to expose patients to common  physical limitations that impact physical activity. Participants will learn practical ways to adapt and manage aches and pains, and to minimize their effect on regular exercise. Patients will learn how to maintain good posture while sitting, walking, and lifting.  Balance Training and Fall Prevention  Clinical staff led group instruction and group discussion with PowerPoint presentation and patient guidebook. To enhance the learning environment the use of posters, models and videos may be added. At the conclusion of this workshop, patients will understand the importance of their sensorimotor skills (vision, proprioception, and the vestibular system) in maintaining their ability to balance as they age. Patients will apply a variety of balancing exercises that are appropriate for their current level of function. Patients will understand the common causes for poor balance, possible solutions to these problems, and ways to modify their physical environment in order to minimize their fall risk. The purpose of this lesson is to teach patients about the importance of maintaining balance as they age and ways to minimize their risk of falling.  WORKSHOPS   Nutrition:  Fueling a Ship broker led group instruction and group discussion with PowerPoint presentation and patient guidebook. To enhance the learning environment the use of posters, models and videos may be added. Patients will review the foundational principles of the Pritikin Eating Plan and understand what constitutes a serving size in each of the food groups. Patients will also learn Pritikin-friendly foods that are better choices when away from home and review make-ahead meal and snack options. Calorie density will be reviewed and applied to three nutrition priorities: weight maintenance, weight loss, and weight gain. The purpose of this lesson is to reinforce (in a group setting) the key concepts around what patients are  recommended to eat and how to apply these guidelines when away from home by planning and selecting Pritikin-friendly options. Patients will understand how calorie density may be adjusted for different weight management goals.  Mindful Eating  Clinical staff led group instruction and group discussion with PowerPoint presentation and patient guidebook. To enhance the learning environment the use of posters, models and videos may be added. Patients will briefly review the concepts of the Pritikin Eating Plan and the importance of low-calorie dense foods. The concept of mindful eating will be introduced as well as the importance of paying attention to internal hunger signals. Triggers for non-hunger eating and techniques for dealing  with triggers will be explored. The purpose of this lesson is to provide patients with the opportunity to review the basic principles of the Pritikin Eating Plan, discuss the value of eating mindfully and how to measure internal cues of hunger and fullness using the Hunger Scale. Patients will also discuss reasons for non-hunger eating and learn strategies to use for controlling emotional eating.  Targeting Your Nutrition Priorities Clinical staff led group instruction and group discussion with PowerPoint presentation and patient guidebook. To enhance the learning environment the use of posters, models and videos may be added. Patients will learn how to determine their genetic susceptibility to disease by reviewing their family history. Patients will gain insight into the importance of diet as part of an overall healthy lifestyle in mitigating the impact of genetics and other environmental insults. The purpose of this lesson is to provide patients with the opportunity to assess their personal nutrition priorities by looking at their family history, their own health history and current risk factors. Patients will also be able to discuss ways of prioritizing and modifying the Pritikin  Eating Plan for their highest risk areas  Menu  Clinical staff led group instruction and group discussion with PowerPoint presentation and patient guidebook. To enhance the learning environment the use of posters, models and videos may be added. Using menus brought in from [employer], or printed from [employer], patients will apply the Pritikin dining out guidelines that were presented in the [employer] video. Patients will also be able to practice these guidelines in a variety of provided scenarios. The purpose of this lesson is to provide patients with the opportunity to practice hands-on learning of the Pritikin Dining Out guidelines with actual menus and practice scenarios.  Label Reading Clinical staff led group instruction and group discussion with PowerPoint presentation and patient guidebook. To enhance the learning environment the use of posters, models and videos may be added. Patients will review and discuss the Pritikin label reading guidelines presented in Pritikin's Label Reading Educational series video. Using fool labels brought in from local grocery stores and markets, patients will apply the label reading guidelines and determine if the packaged food meet the Pritikin guidelines. The purpose of this lesson is to provide patients with the opportunity to review, discuss, and practice hands-on learning of the Pritikin Label Reading guidelines with actual packaged food labels. Cooking School  Pritikin's [employer] are designed to teach patients ways to prepare quick, simple, and affordable recipes at home. The importance of nutrition's role in chronic disease risk reduction is reflected in its emphasis in the overall Pritikin program. By learning how to prepare essential core Pritikin Eating Plan recipes, patients will increase control over what they eat; be able to customize the flavor of foods without the use of added salt, sugar, or fat; and  improve the quality of the food they consume. By learning a set of core recipes which are easily assembled, quickly prepared, and affordable, patients are more likely to prepare more healthy foods at home. These workshops focus on convenient breakfasts, simple entres, side dishes, and desserts which can be prepared with minimal effort and are consistent with nutrition recommendations for cardiovascular risk reduction. Cooking [employer] are taught by a Armed forces logistics/support/administrative officer (RD) who has been trained by the [employer]. The chef or RD has a clear understanding of the importance of minimizing - if not completely eliminating - added fat, sugar, and sodium in recipes. Throughout the  series of Cooking School Workshop sessions, patients will learn about healthy ingredients and efficient methods of cooking to build confidence in their capability to prepare    Cooking School weekly topics:  Adding Flavor- Sodium-Free  Fast and Healthy Breakfasts  Powerhouse Plant-Based Proteins  Satisfying Salads and Dressings  Simple Sides and Sauces  International Cuisine-Spotlight on the [employer] Zones  Delicious Desserts  Savory Soups  [employer] - Meals in a Astronomer Appetizers and Snacks  Comforting Weekend Breakfasts  One-Pot Wonders   Fast Evening Meals  Landscape architect Your Pritikin Plate  WORKSHOPS   Healthy Mindset (Psychosocial):  Focused Goals, Sustainable Changes Clinical staff led group instruction and group discussion with PowerPoint presentation and patient guidebook. To enhance the learning environment the use of posters, models and videos may be added. Patients will be able to apply effective goal setting strategies to establish at least one personal goal, and then take consistent, meaningful action toward that goal. They will learn to identify common barriers to achieving personal goals and develop strategies to overcome them. Patients will  also gain an understanding of how our mind-set can impact our ability to achieve goals and the importance of cultivating a positive and growth-oriented mind-set. The purpose of this lesson is to provide patients with a deeper understanding of how to set and achieve personal goals, as well as the tools and strategies needed to overcome common obstacles which may arise along the way.  From Head to Heart: The Power of a Healthy Outlook  Clinical staff led group instruction and group discussion with PowerPoint presentation and patient guidebook. To enhance the learning environment the use of posters, models and videos may be added. Patients will be able to recognize and describe the impact of emotions and mood on physical health. They will discover the importance of self-care and explore self-care practices which may work for them. Patients will also learn how to utilize the 4 C's to cultivate a healthier outlook and better manage stress and challenges. The purpose of this lesson is to demonstrate to patients how a healthy outlook is an essential part of maintaining good health, especially as they continue their cardiac rehab journey.  Healthy Sleep for a Healthy Heart Clinical staff led group instruction and group discussion with PowerPoint presentation and patient guidebook. To enhance the learning environment the use of posters, models and videos may be added. At the conclusion of this workshop, patients will be able to demonstrate knowledge of the importance of sleep to overall health, well-being, and quality of life. They will understand the symptoms of, and treatments for, common sleep disorders. Patients will also be able to identify daytime and nighttime behaviors which impact sleep, and they will be able to apply these tools to help manage sleep-related challenges. The purpose of this lesson is to provide patients with a general overview of sleep and outline the importance of quality sleep. Patients will  learn about a few of the most common sleep disorders. Patients will also be introduced to the concept of "sleep hygiene," and discover ways to self-manage certain sleeping problems through simple daily behavior changes. Finally, the workshop will motivate patients by clarifying the links between quality sleep and their goals of heart-healthy living.   Recognizing and Reducing Stress Clinical staff led group instruction and group discussion with PowerPoint presentation and patient guidebook. To enhance the learning environment the use of posters, models and videos may be added. At the conclusion of this workshop, patients will  be able to understand the types of stress reactions, differentiate between acute and chronic stress, and recognize the impact that chronic stress has on their health. They will also be able to apply different coping mechanisms, such as reframing negative self-talk. Patients will have the opportunity to practice a variety of stress management techniques, such as deep abdominal breathing, progressive muscle relaxation, and/or guided imagery.  The purpose of this lesson is to educate patients on the role of stress in their lives and to provide healthy techniques for coping with it.  Learning Barriers/Preferences:  Learning Barriers/Preferences - 02/01/24 1206       Learning Barriers/Preferences   Learning Barriers Sight   wears reading glasses   Learning Preferences Audio;Verbal Instruction;Computer/Internet;Video;Written Material;Group Instruction;Individual Instruction;Pictoral;Skilled Demonstration             Education Topics:  Knowledge Questionnaire Score:  Knowledge Questionnaire Score - 02/01/24 1329       Knowledge Questionnaire Score   Pre Score 24/24             Core Components/Risk Factors/Patient Goals at Admission:  Personal Goals and Risk Factors at Admission - 02/01/24 1206       Core Components/Risk Factors/Patient Goals on Admission     Weight Management Yes;Obesity;Weight Loss    Intervention Weight Management: Develop a combined nutrition and exercise program designed to reach desired caloric intake, while maintaining appropriate intake of nutrient and fiber, sodium and fats, and appropriate energy expenditure required for the weight goal.;Weight Management: Provide education and appropriate resources to help participant work on and attain dietary goals.;Weight Management/Obesity: Establish reasonable short term and long term weight goals.    Admit Weight 187 lb 14.4 oz (85.2 kg)    Goal Weight: Long Term 176 lb (79.8 kg)    Expected Outcomes Short Term: Continue to assess and modify interventions until short term weight is achieved;Long Term: Adherence to nutrition and physical activity/exercise program aimed toward attainment of established weight goal;Weight Loss: Understanding of general recommendations for a balanced deficit meal plan, which promotes 1-2 lb weight loss per week and includes a negative energy balance of [phone] kcal/d;Understanding recommendations for meals to include 15-35% energy as protein, 25-35% energy from fat, 35-60% energy from carbohydrates, less than 200mg  of dietary cholesterol, 20-35 gm of total fiber daily;Understanding of distribution of calorie intake throughout the day with the consumption of 4-5 meals/snacks    Hypertension Yes    Intervention Provide education on lifestyle modifcations including regular physical activity/exercise, weight management, moderate sodium restriction and increased consumption of fresh fruit, vegetables, and low fat dairy, alcohol moderation, and smoking cessation.;Monitor prescription use compliance.    Expected Outcomes Short Term: Continued assessment and intervention until BP is < 140/70mm HG in hypertensive participants. < 130/3mm HG in hypertensive participants with diabetes, heart failure or chronic kidney disease.;Long Term: Maintenance of blood pressure at goal  levels.    Lipids Yes    Intervention Provide education and support for participant on nutrition & aerobic/resistive exercise along with prescribed medications to achieve LDL 70mg , HDL >40mg .    Expected Outcomes Short Term: Participant states understanding of desired cholesterol values and is compliant with medications prescribed. Participant is following exercise prescription and nutrition guidelines.;Long Term: Cholesterol controlled with medications as prescribed, with individualized exercise RX and with personalized nutrition plan. Value goals: LDL < 70mg , HDL > 40 mg.             Core Components/Risk Factors/Patient Goals Review:   Goals and Risk Factor Review  Row Name 02/07/24 1641 02/11/24 0911 03/08/24 1303         Core Components/Risk Factors/Patient Goals Review   Personal Goals Review Weight Management/Obesity;Hypertension;Lipids Weight Management/Obesity;Hypertension;Lipids Weight Management/Obesity;Hypertension;Lipids     Review Shelly [name] started cardiac rehab on 02/07/24. Shelly [name] did well with exercise. vital signs and CBg's were stable. Shelly [name] started cardiac rehab on 02/07/24. Shelly [name] is off to a good start  with exercise. vital signs and CBg's were stable. Shelly [name] is not taking metformin  at this time, Spot checked CBG Shelly [name] is doing well with exercise at cardiac rehab. vital signs have been  stable.     Expected Outcomes Shelly [name] will continue to participate in cardiac rehab for exercise, nutrition and lifestyle modifications Shelly [name] will continue to participate in cardiac rehab for exercise, nutrition and lifestyle modifications Shelly [name] will continue to participate in cardiac rehab for exercise, nutrition and lifestyle modifications              Core Components/Risk Factors/Patient Goals at Discharge (Final Review):   Goals and Risk Factor Review - 03/08/24 1303       Core Components/Risk Factors/Patient Goals Review   Personal Goals Review Weight Management/Obesity;Hypertension;Lipids     Review Shelly [name] is doing well with exercise at cardiac rehab. vital signs have been  stable.    Expected Outcomes Shelly [name] will continue to participate in cardiac rehab for exercise, nutrition and lifestyle modifications             ITP Comments:  ITP Comments     Row Name 02/01/24 1012 02/07/24 1638 02/11/24 0909 03/08/24 1258     ITP Comments Dr. Gaylyn Keas medical director. Introduction to pritikin education/intensive cardiac rehab. Initial orientation packet reviewed with patient. 30 Day ITP Review. Shelly [name] started cardiac rehab on 02/07/24. Shelly [name] did well with exercise. 30 Day ITP Review. Shelly [name] started cardiac rehab on 02/07/24. Shelly [name] is off to a good start  with exercise. 30 Day ITP Review. Shelly [name] has good attendance and participation with exercise at cardiac rehab             Comments: See ITP comments.Monte Antonio RN BSN

## 2024-03-09 ENCOUNTER — Telehealth: Payer: Self-pay

## 2024-03-09 NOTE — Telephone Encounter (Signed)
 Pt ready for scheduling for PROLIA  on or after : 03/07/24  Option# 1: Buy/Bill (Office supplied medication)  Out-of-pocket cost due at time of clinic visit: $0  Number of injection/visits approved: ---  Primary: MEDICARE Prolia  co-insurance: 0% Admin fee co-insurance: 0%  Secondary: UHC AARP MEDSUP Prolia  co-insurance:  Admin fee co-insurance:   Medical Benefit Details: Date Benefits were checked: 02/04/24 Deductible: $257 Met of $257 Required/ Coinsurance: 0%/ Admin Fee: 0%  Prior Auth: N/A PA# Expiration Date:   # of doses approved: ----------------------------------------------------------------------- Option# 2- Med Obtained from pharmacy:  Pharmacy benefit: Copay $--- (Paid to pharmacy) Admin Fee: --- (Pay at clinic)  Prior Auth: N/A PA# Expiration Date:   # of doses approved:   If patient wants fill through the pharmacy benefit please send prescription to:  --- , and include estimated need by date in rx notes. Pharmacy will ship medication directly to the office.  Patient NOT eligible for Prolia  Copay Card. Copay Card can make patient's cost as little as $25. Link to apply: [email or web address]  ** This summary of benefits is an estimation of the patient's out-of-pocket cost. Exact cost may very based on individual plan coverage.

## 2024-03-10 ENCOUNTER — Encounter (HOSPITAL_COMMUNITY)
Admission: RE | Admit: 2024-03-10 | Discharge: 2024-03-10 | Disposition: A | Discharge status: Home / Self Care | Source: Ambulatory Visit | Attending: Cardiovascular Disease | Admitting: Cardiovascular Disease

## 2024-03-10 DIAGNOSIS — Z955 Presence of coronary angioplasty implant and graft: Secondary | ICD-10-CM | POA: Diagnosis not present

## 2024-03-13 ENCOUNTER — Encounter (HOSPITAL_COMMUNITY)
Admission: RE | Admit: 2024-03-13 | Discharge: 2024-03-13 | Disposition: A | Discharge status: Home / Self Care | Source: Ambulatory Visit | Attending: Cardiovascular Disease | Admitting: Cardiovascular Disease

## 2024-03-13 DIAGNOSIS — Z955 Presence of coronary angioplasty implant and graft: Secondary | ICD-10-CM

## 2024-03-14 DIAGNOSIS — Z01419 Encounter for gynecological examination (general) (routine) without abnormal findings: Secondary | ICD-10-CM | POA: Diagnosis not present

## 2024-03-14 DIAGNOSIS — Z6831 Body mass index (BMI) 31.0-31.9, adult: Secondary | ICD-10-CM | POA: Diagnosis not present

## 2024-03-15 ENCOUNTER — Other Ambulatory Visit (HOSPITAL_COMMUNITY): Payer: Self-pay

## 2024-03-15 ENCOUNTER — Encounter (HOSPITAL_COMMUNITY)
Admission: RE | Admit: 2024-03-15 | Discharge: 2024-03-15 | Disposition: A | Discharge status: Home / Self Care | Source: Ambulatory Visit | Attending: Cardiovascular Disease

## 2024-03-15 DIAGNOSIS — Z955 Presence of coronary angioplasty implant and graft: Secondary | ICD-10-CM

## 2024-03-17 ENCOUNTER — Encounter (HOSPITAL_COMMUNITY)
Admission: RE | Admit: 2024-03-17 | Discharge: 2024-03-17 | Disposition: A | Discharge status: Home / Self Care | Source: Ambulatory Visit | Attending: Cardiovascular Disease | Admitting: Cardiovascular Disease

## 2024-03-17 DIAGNOSIS — Z955 Presence of coronary angioplasty implant and graft: Secondary | ICD-10-CM | POA: Insufficient documentation

## 2024-03-17 NOTE — Progress Notes (Signed)
 Incomplete Session Note  Patient Details  Name: Catlin Mckiver MRN: [id number] Date of Birth: 02-Jun-1946 Referring Provider:   Flowsheet Row INTENSIVE CARDIAC REHAB ORIENT from 02/01/2024 in [facility]  Referring Provider Magnus Schuller, MD       Odetta Benes Haas did not complete her rehab session. Deatra Face stated she did not get enough rest last night d/t being up with her ill dog. She stated she didn't feel well today, "in a fog", tired, did not drink much water. Ate an egg salad sandwich for lunch. VSS 132/74, HR 63, 95% on RA. Spoke to primary RN Shelagh Derrick stated that she doesn't need to exercise for today. Deatra Face called her husband to come pick her up. Informed NP Katlyn West onsite provider for the day.

## 2024-03-20 ENCOUNTER — Encounter (HOSPITAL_COMMUNITY)
Admission: RE | Admit: 2024-03-20 | Discharge: 2024-03-20 | Disposition: A | Discharge status: Home / Self Care | Source: Ambulatory Visit | Attending: Cardiovascular Disease | Admitting: Cardiovascular Disease

## 2024-03-20 DIAGNOSIS — Z955 Presence of coronary angioplasty implant and graft: Secondary | ICD-10-CM | POA: Diagnosis not present

## 2024-03-21 ENCOUNTER — Telehealth: Payer: Self-pay | Admitting: Pharmacy Technician

## 2024-03-21 ENCOUNTER — Encounter: Payer: Self-pay | Admitting: Pharmacist

## 2024-03-21 ENCOUNTER — Other Ambulatory Visit (HOSPITAL_COMMUNITY): Payer: Self-pay

## 2024-03-21 ENCOUNTER — Ambulatory Visit: Attending: Cardiology | Admitting: Pharmacist

## 2024-03-21 VITALS — Wt 185.2 lb

## 2024-03-21 DIAGNOSIS — E782 Mixed hyperlipidemia: Secondary | ICD-10-CM | POA: Insufficient documentation

## 2024-03-21 DIAGNOSIS — I2510 Atherosclerotic heart disease of native coronary artery without angina pectoris: Secondary | ICD-10-CM

## 2024-03-21 MED ORDER — PRALUENT 150 MG/ML ~~LOC~~ SOAJ
150.0000 mg | SUBCUTANEOUS | 1 refills | Status: DC
  Filled 2024-03-21: qty 6, 84d supply, fill #0
  Filled 2024-06-09: qty 6, 84d supply, fill #1

## 2024-03-21 NOTE — Telephone Encounter (Signed)
 Patient Advocate Encounter   The patient was approved for a [employer] grant that will help cover the cost of Praluent Total amount awarded, 2500.00.  Effective: 02/20/24 - 02/18/25   ZOX:[id number] [id number] [id number]  [employer] ID: [id number]   Pharmacy provided with approval and processing information. Patient informed via mychart

## 2024-03-21 NOTE — Telephone Encounter (Signed)
 Thank you. Can you enroll her in [employer]?

## 2024-03-21 NOTE — Telephone Encounter (Signed)
 This encounter was created in error - please disregard.

## 2024-03-21 NOTE — Addendum Note (Signed)
 Addended by: Sunny English on: 03/21/2024 04:10 PM   Modules accepted: Orders

## 2024-03-21 NOTE — Progress Notes (Signed)
 Patient ID: Shelly [name]                 DOB: 1946-02-25                    MRN: [id number]     HPI: Shelly [name] is a 78 y.o. female patient referred to lipid clinic by Shelly [name], PAc. PMH is significant for unstable angina, HTN, CAD s/p NSTEMI in March 2025 with 2x DES to proximal/id RCA, T2DM, IBS, GERD, HLD.   Today, patient presents in good spirits. She is accompanied by her husband, Shelly [name]. She reports that she is taking all her medications as prescribed at discharge, except for metformin . She started having severe stomach pain ~2 days after discharge. Her PCP, Dr. Darren [name] advised to hold metformin . Stomach pain has improved since stopping metformin , however she continues to have more mild abdominal cramping every evening. She endorses making several diet changes after NSTEMI including decreasing sodium and sugar intake. She reports she has been concerned about the interaction between Brilinta  and alcohol, and she has cut back alcohol intake from 2 glasses of wine nightly to 1 glass nightly. She also reports some occasionally dyspnea about once/day since starting Brilinta .  Current Medications: rosuvastatin  10 mg daily Intolerances: atorvastatin  (itching), rosuvastatin  (itching, GI upset), pravastatin  (itching) Risk Factors: ASCVD, T2DM,  LDL goal: <55 mg/dL  Diet: Reports that her and her husband have decrease sodium and sugar intake by cutting back intake of potato chips and desserts. They eat out about once/week but try to choose healthier options. Protein primarily includes fish (grilled salmon, trout, shrimp). Tries to avoid fried foods  Exercise: did not discuss today  Social History:  Alcohol: one glass of wine with dinner   Lipid Panel     Component Value Date/Time   CHOL 266 (H) 01/18/2024 0354   TRIG 155 (H) 01/18/2024 0354   HDL 50 01/18/2024 0354   CHOLHDL 5.3 01/18/2024 0354   VLDL 31 01/18/2024 0354   LDLCALC 185 (H) 01/18/2024 0354   LDLDIRECT 215.9  06/04/2011 1004     Past Medical History:  Diagnosis Date   Allergy    Arthritis    wrist, shoulder    Cancer (HCC) 08/2019   left breast IDC   Diverticulitis    Endometrial polyp    Family history of breast cancer    GERD (gastroesophageal reflux disease)    occasional - diet controlled   Hx of adenomatous colonic polyps    Hyperlipidemia    IBS (irritable bowel syndrome)    Missed abortion    x 1 - resolved - no surgery   Osteoporosis    SVD (spontaneous vaginal delivery)    x 1   Transient vision disturbance 2019   was evaluated by ED, no findings   Vitamin D  deficiency     Current Outpatient Medications on File Prior to Visit  Medication Sig Dispense Refill   acetaminophen  (TYLENOL ) 500 MG tablet Take 500 mg by mouth every 6 (six) hours as needed for headache or mild pain.     aspirin  EC 81 MG tablet Take 1 tablet (81 mg total) by mouth daily. Swallow whole. 90 tablet 2   calcium -vitamin D  (OSCAL WITH D) 500-5 MG-MCG tablet Take 1 tablet by mouth daily.     clidinium-chlordiazePOXIDE  (LIBRAX ) 5-2.5 MG capsule TAKE 1 CAPSULE EVERY DAY AS NEEDED (Patient taking differently: Take 1 capsule by mouth daily as needed (bowel issue).) 90 capsule 0  denosumab  (PROLIA ) 60 MG/ML SOSY injection Inject 60 mg into the skin every 6 (six) months. Last inj was in October     loratadine (CLARITIN) 10 MG tablet Take 10 mg by mouth daily.     metFORMIN  (GLUCOPHAGE -XR) 500 MG 24 hr tablet Take 1 tablet (500 mg total) by mouth daily with breakfast. 30 tablet 0   metoprolol  succinate (TOPROL -XL) 25 MG 24 hr tablet Take 1 tablet (25 mg total) by mouth daily. 90 tablet 0   nitroGLYCERIN  (NITROSTAT ) 0.4 MG SL tablet Place 1 tablet (0.4 mg total) under the tongue every 5 (five) minutes x 3 doses as needed for chest pain. 25 tablet 2   rosuvastatin  (CRESTOR ) 10 MG tablet Take 1 tablet (10 mg total) by mouth daily. 90 tablet 0   ticagrelor  (BRILINTA ) 90 MG TABS tablet Take 1 tablet (90 mg total) by  mouth 2 (two) times daily. 180 tablet 2   [DISCONTINUED] omeprazole (PRILOSEC) 40 MG capsule Take 40 mg by mouth daily.     [DISCONTINUED] pravastatin  (PRAVACHOL ) 40 MG tablet Take 1 tablet (40 mg total) by mouth every evening. 30 tablet 3   [DISCONTINUED] prochlorperazine  (COMPAZINE ) 10 MG tablet Take 1 tablet (10 mg total) by mouth every 6 (six) hours as needed (Nausea or vomiting). 30 tablet 1   No current facility-administered medications on file prior to visit.    Allergies  Allergen Reactions   Taxotere  [Docetaxel ] Swelling   Abraxane  [Paclitaxel  Protein-Bound Part] Other (See Comments)    3 days after receiving she had one episode of very high BP and HR. This happened again 2 days later during the night.    Amlodipine      Caused IBS flare-up. Returned after stopping and restarting med   Crestor  [Rosuvastatin  Calcium ] Itching    GI upset   Lipitor [Atorvastatin  Calcium ] Itching   Lisinopril  Cough   Losartan      Diffuse arthralgias and back pain. Returned after stopping and restarting med   Nsaids Other (See Comments)    GI upset   Pravastatin  Itching    GI upset   Sulfonamide Derivatives Rash    Hives, itiching    Assessment/Plan:  1. Hyperlipidemia - Currently uncontrolled  with LDL-C of 185 mg/dL above goal < 55 mg/dL given history of Z6XW and ASCVD with recent NSTEMI (March 2025). Patient is tolerating moderate intensity rosuvastatin , Given baseline LDL-C of 185 mg/dL (and history of LDL > 960 mg/dL in 4540), anticipate patient will need additional lipid lowering therapy to achieve cholesterol goal. Patient amenable to starting PCSK9i. Discussed efficacy, dosing, side effects, and copay information.  - Start Repatha 140 mg injected into the skin once every 14 days . If this is not covered by insurance, may also consider Praluent 150 mg injected into the skin once every 14 days. - Continue rosuvastatin  10 mg daily - Will collaborate with patient advocate team to investigate  coverage and communicate results of PA to patient - Repeat fasting lipid panel in 8-12 weeks  .Arthea Larsson, PharmD PGY1 Pharmacy Resident  Seen with PGY1 resident and agree with plan  Joelene Murrain, PharmD, BCACP, CDCES, CPP [address], [address] [place], [place], [address] Phone: [phone], Fax: [phone]

## 2024-03-21 NOTE — Patient Instructions (Addendum)
 It was great to see you today!  We are going to submit a prior authorization for Repatha (evolucumab) which is an injection to lower your LDL cholesterol. You will inject this medication (140 mg) every 2 weeks (14 days).   We will call you once we hear back from your insurance. We will send the prescription to [facility].   Please come back for repeat labs in 2-3 months. You do not need an appointment, just come to the lab downstairs.   Let us  know if you have any questions or concerns

## 2024-03-21 NOTE — Telephone Encounter (Signed)
 Pharmacy Patient Advocate Encounter   Received notification from Physician's Office that prior authorization for Praluent is required/requested.   Insurance verification completed.   The patient is insured through [facility] .   Per test claim: PA required; PA submitted to above mentioned insurance via CoverMyMeds Key/confirmation #/EOC [id number] Status is pending

## 2024-03-21 NOTE — Telephone Encounter (Signed)
-----   Message from Adra Alanis sent at 03/21/2024 10:29 AM EDT ----- Hello - please assist in completing a PA for Repatha 140 mg every 2 weeks (or Praluent 150 mg/dL). Patient had a recent NSTEMI in March 2025, she is on maximally tolerated rousvastatin 10 mg daily, and has baseline LDL-C of 185 mg/dL (though has a remote history of LDL-C 212 mg/dL). Please let me know if there are any additional questions or concerns. Thank you!  Arthea Larsson, PharmD PGY1 Pharmacy Resident

## 2024-03-21 NOTE — Telephone Encounter (Signed)
 Pharmacy Patient Advocate Encounter  Received notification from [employer] that Prior Authorization for Praluent (preferred) has been APPROVED from 03/07/24 to 11/15/2098. Ran test claim, Copay is $69.94- one month. This test claim was processed through [facility]- copay amounts may vary at other pharmacies due to pharmacy/plan contracts, or as the patient moves through the different stages of their insurance plan.   PA #/Case ID/Reference #: [id number]

## 2024-03-22 ENCOUNTER — Other Ambulatory Visit: Payer: Self-pay

## 2024-03-22 ENCOUNTER — Other Ambulatory Visit (INDEPENDENT_AMBULATORY_CARE_PROVIDER_SITE_OTHER)

## 2024-03-22 ENCOUNTER — Encounter (HOSPITAL_COMMUNITY)
Admission: RE | Admit: 2024-03-22 | Discharge: 2024-03-22 | Disposition: A | Discharge status: Home / Self Care | Source: Ambulatory Visit | Attending: Cardiovascular Disease

## 2024-03-22 ENCOUNTER — Other Ambulatory Visit

## 2024-03-22 ENCOUNTER — Other Ambulatory Visit (HOSPITAL_COMMUNITY): Payer: Self-pay

## 2024-03-22 DIAGNOSIS — Z955 Presence of coronary angioplasty implant and graft: Secondary | ICD-10-CM | POA: Diagnosis not present

## 2024-03-22 DIAGNOSIS — E785 Hyperlipidemia, unspecified: Secondary | ICD-10-CM | POA: Diagnosis not present

## 2024-03-22 DIAGNOSIS — Z170 Estrogen receptor positive status [ER+]: Secondary | ICD-10-CM

## 2024-03-22 LAB — HEPATIC FUNCTION PANEL
ALT: 14 U/L (ref 0–35)
AST: 15 U/L (ref 0–37)
Albumin: 4.1 g/dL (ref 3.5–5.2)
Alkaline Phosphatase: 60 U/L (ref 39–117)
Bilirubin, Direct: 0.1 mg/dL (ref 0.0–0.3)
Total Bilirubin: 0.5 mg/dL (ref 0.2–1.2)
Total Protein: 6.8 g/dL (ref 6.0–8.3)

## 2024-03-22 LAB — LIPID PANEL
Cholesterol: 133 mg/dL (ref 0–200)
HDL: 41.4 mg/dL (ref >39.00)
LDL Cholesterol: 65 mg/dL (ref 0–99)
NonHDL: 91.9
Total CHOL/HDL Ratio: 3
Triglycerides: 134 mg/dL (ref 0.0–149.0)
VLDL: 26.8 mg/dL (ref 0.0–40.0)

## 2024-03-22 NOTE — Progress Notes (Signed)
 Reviewed home exercise with pt today. Pt is tolerating exercise well. Pt will continue to exercise on their own by walking and walking dog for 15 minutes per session 2 times a da,  1-2 days a week in addition to the 3 days in CRP2. Advised pt on THRR, RPE scale, hydration and temperature/humidity precautions. Reinforced NTG use, S/S to stop exercise and when to call MD vs 911. Encouraged warm up cool down and stretches with exercise sessions. Pt verbalized understanding, all questions were answered and pt was given a copy to take home.    Arlander Labrum, MS, ACSM-CEP 03/22/2024 4:49 PM

## 2024-03-23 ENCOUNTER — Inpatient Hospital Stay: Payer: Medicare Other | Attending: Nurse Practitioner

## 2024-03-23 ENCOUNTER — Telehealth: Payer: Self-pay | Admitting: *Deleted

## 2024-03-23 ENCOUNTER — Inpatient Hospital Stay: Payer: Medicare Other | Admitting: Nurse Practitioner

## 2024-03-23 VITALS — BP 150/78 | HR 54 | Temp 97.6°F | Resp 17 | Wt 187.3 lb

## 2024-03-23 DIAGNOSIS — C50112 Malignant neoplasm of central portion of left female breast: Secondary | ICD-10-CM | POA: Diagnosis not present

## 2024-03-23 DIAGNOSIS — Z853 Personal history of malignant neoplasm of breast: Secondary | ICD-10-CM | POA: Diagnosis not present

## 2024-03-23 DIAGNOSIS — Z170 Estrogen receptor positive status [ER+]: Secondary | ICD-10-CM | POA: Diagnosis not present

## 2024-03-23 DIAGNOSIS — Z9221 Personal history of antineoplastic chemotherapy: Secondary | ICD-10-CM | POA: Diagnosis not present

## 2024-03-23 DIAGNOSIS — Z923 Personal history of irradiation: Secondary | ICD-10-CM | POA: Insufficient documentation

## 2024-03-23 LAB — CBC WITH DIFFERENTIAL (CANCER CENTER ONLY)
Abs Immature Granulocytes: 0.01 10*3/uL (ref 0.00–0.07)
Basophils Absolute: 0 10*3/uL (ref 0.0–0.1)
Basophils Relative: 1 %
Eosinophils Absolute: 0.1 10*3/uL (ref 0.0–0.5)
Eosinophils Relative: 1 %
HCT: 39.6 % (ref 36.0–46.0)
Hemoglobin: 13.2 g/dL (ref 12.0–15.0)
Immature Granulocytes: 0 %
Lymphocytes Relative: 23 %
Lymphs Abs: 1.4 10*3/uL (ref 0.7–4.0)
MCH: 28.9 pg (ref 26.0–34.0)
MCHC: 33.3 g/dL (ref 30.0–36.0)
MCV: 86.8 fL (ref 80.0–100.0)
Monocytes Absolute: 0.5 10*3/uL (ref 0.1–1.0)
Monocytes Relative: 9 %
Neutro Abs: 4.1 10*3/uL (ref 1.7–7.7)
Neutrophils Relative %: 66 %
Platelet Count: 263 10*3/uL (ref 150–400)
RBC: 4.56 MIL/uL (ref 3.87–5.11)
RDW: 13.8 % (ref 11.5–15.5)
WBC Count: 6.1 10*3/uL (ref 4.0–10.5)
nRBC: 0 % (ref 0.0–0.2)

## 2024-03-23 LAB — CMP (CANCER CENTER ONLY)
ALT: 15 U/L (ref 0–44)
AST: 13 U/L — ABNORMAL LOW (ref 15–41)
Albumin: 4.1 g/dL (ref 3.5–5.0)
Alkaline Phosphatase: 63 U/L (ref 38–126)
Anion gap: 6 (ref 5–15)
BUN: 23 mg/dL (ref 8–23)
CO2: 28 mmol/L (ref 22–32)
Calcium: 9 mg/dL (ref 8.9–10.3)
Chloride: 106 mmol/L (ref 98–111)
Creatinine: 1.03 mg/dL — ABNORMAL HIGH (ref 0.44–1.00)
GFR, Estimated: 55 mL/min — ABNORMAL LOW (ref >60)
Glucose, Bld: 108 mg/dL — ABNORMAL HIGH (ref 70–99)
Potassium: 4.6 mmol/L (ref 3.5–5.1)
Sodium: 140 mmol/L (ref 135–145)
Total Bilirubin: 0.4 mg/dL (ref 0.0–1.2)
Total Protein: 6.7 g/dL (ref 6.5–8.1)

## 2024-03-23 NOTE — Progress Notes (Signed)
 Patient Care Team: Marquetta Sit, MD as PCP - General (Family Medicine) Millicent Ally, MD as PCP - Cardiology (Cardiology) Marshel Skeeters, OD as Referring Physician (Optometry) Caralyn Chandler, MD as Consulting Physician (General Surgery) Auther Bo, RN as Oncology Nurse Navigator Alane Hsu, RN as Oncology Nurse Navigator Sonja Berlin, MD as Consulting Physician (Hematology) Burton, Lacie K, NP as Nurse Practitioner (Nurse Practitioner) Colie Dawes, MD as Attending Physician (Radiation Oncology)  Clinic Day:  03/29/2024  Referring physician: Marquetta Sit, MD  ASSESSMENT & PLAN:   Assessment & Plan: Cancer of central portion of left female breast (HCC) Stage IA, c(T1bN0M0), ER/PR+, HER2-, Grade II, pT1cN1aM0, stage IA  -Diagnosed in 08/2019, s/p B/l breast lumpectomy and SLNB with Dr. Alethea Andes on 10/06/19. Path showed: 1.8 cm IDC and DCIS, anterior margin focally positive; one positive lymph node with extranodal extension (1/3). Re-excision on 11/06/19 showed DCIS only, margins negative.  -Mammaprint showed high risk. She did not tolerate TC (allergic reaction to taxol ) or Abraxane  (symptomatic tachycardia, GI symptoms). -She completed adjuvant RT with Dr Lurena Sally 01/25/20 - 03/06/20.  -She declined genetic testing -She took antiestrogen therapy over 2 months 05/2020-07/2020 and stopped due to poor toleration/side effects (exacerbated IBS from tamoxifen ; bone concerns and anxiety from exemestane , declined to restart with SSRI). -3D diagnostic mammogram was done in 08/26/2023 and was negative for evidence of malignancy.  New order for diagnostic mammogram will be scheduled for 08/2024.  -Follow-up in 6 months, or sooner if needed   Plan:  Labs reviewed.  -all stable and unremarkable.  Diagnostic mammogram done 08/26/2023 with benign results. Will order 3D diagnostic mammogram for 08/2024.  Continue breast cancer surveillance.  Labs and follow up 6 months and sooner  if needed.     The patient understands the plans discussed today and is in agreement with them.  She knows to contact our office if she develops concerns prior to her next appointment.  I provided 25 minutes of face-to-face time during this encounter and > 50% was spent counseling as documented under my assessment and plan.    Sharyon Deis, NP  [place] [facility] - A DEPT OF [facility] [address] [place] [address] Dept: [phone] Dept Fax: [phone]   Orders Placed This Encounter  Procedures   MM 3D DIAGNOSTIC MAMMOGRAM BILATERAL BREAST    Standing Status:   Future    Expected Date:   08/29/2024    Expiration Date:   03/29/2025    Reason for Exam (SYMPTOM  OR DIAGNOSIS REQUIRED):   breast cancer follow up    Preferred imaging location?:   [facility]      CHIEF COMPLAINT:  CC: Left breast cancer, estrogen receptor positive  Current Treatment: Surveillance  INTERVAL HISTORY:  Shelly [name] is here today for repeat clinical assessment.  She was last seen by myself on 09/23/2023.  She will be due for diagnostic mammogram in October 2025.  She does report some intermittent left breast pain along the surgical scars. She denies new lumps or masses in the breast. She denies chest pain, chest pressure, or shortness of breath. She denies headaches or visual disturbances. She denies abdominal pain, nausea, vomiting, or changes in bowel or bladder habits.  She denies fevers or chills. She denies pain. Her appetite is good. Her weight has been stable.  I have reviewed the past medical history, past surgical history, social history and  family history with the patient and they are unchanged from previous note.  ALLERGIES:  is allergic to taxotere  [docetaxel ], abraxane  [paclitaxel  protein-bound part], amlodipine , crestor  [rosuvastatin  calcium ], lipitor [atorvastatin  calcium ], lisinopril , losartan , nsaids, pravastatin ,  and sulfonamide derivatives.  MEDICATIONS:  Current Outpatient Medications  Medication Sig Dispense Refill   acetaminophen  (TYLENOL ) 500 MG tablet Take 500 mg by mouth every 6 (six) hours as needed for headache or mild pain.     Alirocumab  (PRALUENT ) 150 MG/ML SOAJ Inject 1 mL (150 mg total) into the skin every 14 (fourteen) days. 6 mL 1   aspirin  EC 81 MG tablet Take 1 tablet (81 mg total) by mouth daily. Swallow whole. 90 tablet 2   clidinium-chlordiazePOXIDE  (LIBRAX ) 5-2.5 MG capsule TAKE 1 CAPSULE EVERY DAY AS NEEDED (Patient taking differently: Take 1 capsule by mouth daily as needed (bowel issue).) 90 capsule 0   denosumab  (PROLIA ) 60 MG/ML SOSY injection Inject 60 mg into the skin every 6 (six) months. Last inj was in October     loratadine (CLARITIN) 10 MG tablet Take 10 mg by mouth daily.     metoprolol  succinate (TOPROL -XL) 25 MG 24 hr tablet Take 1 tablet (25 mg total) by mouth daily. 90 tablet 0   nitroGLYCERIN  (NITROSTAT ) 0.4 MG SL tablet Place 1 tablet (0.4 mg total) under the tongue every 5 (five) minutes x 3 doses as needed for chest pain. 25 tablet 2   rosuvastatin  (CRESTOR ) 10 MG tablet Take 1 tablet (10 mg total) by mouth daily. 90 tablet 0   ticagrelor  (BRILINTA ) 90 MG TABS tablet Take 1 tablet (90 mg total) by mouth 2 (two) times daily. 180 tablet 2   No current facility-administered medications for this visit.    HISTORY OF PRESENT ILLNESS:   Oncology History Overview Note  Cancer Staging Cancer of central portion of left female breast [facility]) Staging form: Breast, AJCC 8th Edition - Clinical stage from 09/18/2019: Stage IA (cT1b, cN0, cM0, G2, ER+, PR+, HER2-) - Signed by Sonja Heidelberg, MD on 09/18/2019 - Pathologic stage from 10/06/2019: Stage IA (pT1c, pN1a, cM0, G2, ER+, PR+, HER2-) - Signed by Sonja [place], MD on 10/19/2019    Cancer of central portion of left female breast (HCC)  08/24/2019 Mammogram   Diagnostic mammogram 08/24/19  IMPRESSION: 1. Left breast mass  measuring 0.8 x 0.4 x 0.5 cm at the 1 o'clock retroareolar region is suspicious and likely corresponds to the area of distortion seen on mammography.   2. Left breast 3 o'clock retroareolar dilated duct with internal debris versus solid mass.   3. Right breast retroareolar focal asymmetry without sonographic correlate.   08/28/2019 Initial Biopsy   Final Diagnosis 08/28/19 1. Breast, left, needle core biopsy, 1 o'clock/retroareolar - INVASIVE DUCTAL CARCINOMA, GRADE II. - PERINEURAL INVASION. - SEE MICROSCOPIC DESCRIPTION. 2. Breast, left, needle core biopsy, 3 o'clock retroareolar - DUCTAL PAPILLOMA. - SEE MICROSCOPIC DESCRIPTION.   08/28/2019 Receptors her2   1. PROGNOSTIC INDICATORS 08/28/19 Results: IMMUNOHISTOCHEMICAL AND MORPHOMETRIC ANALYSIS PERFORMED MANUALLY The tumor cells are NEGATIVE for Her2 (0). Estrogen Receptor: 100%, POSITIVE, STRONG STAINING INTENSITY Progesterone Receptor: 50%, POSITIVE, STRONG STAINING INTENSITY Proliferation Marker Ki67: 12%   09/01/2019 Pathology Results   Diagnosis 09/01/19 Breast, right, needle core biopsy, retroareolar - COMPLEX SCLEROSING LESION WITH A PAPILLARY SCLEROSING LESION AND USUAL DUCTAL HYPERPLASIA. SEE NOTE - NEGATIVE FOR CARCINOMA   09/18/2019 Initial Diagnosis   Cancer of central portion of left female breast (HCC)   09/18/2019 Cancer Staging   Staging form: Breast,  AJCC 8th Edition - Clinical stage from 09/18/2019: Stage IA (cT1b, cN0, cM0, G2, ER+, PR+, HER2-) - Signed by Sonja Easton, MD on 09/18/2019   10/06/2019 Surgery   LEFT BREAST LUMPECTOMY  X2 WITH RADIOACTIVE SEED X2  AND SENTINEL LYMPH NODE BIOPSY and  RIGHT BREAST LUMPECTOMY WITH RADIOACTIVE SEED LOCALIZATION by Dr Alethea Andes  10/06/19    10/06/2019 Pathology Results   FINAL MICROSCOPIC DIAGNOSIS: 10/06/19   A. BREAST, RIGHT, LUMPECTOMY:  - Fibrocystic change with a small incidental intraductal papilloma and  usual ductal hyperplasia  - Biopsy site changes   - Negative for carcinoma   B. LYMPH NODE, LEFT #1, SENTINEL, BIOPSY:  - Metastatic carcinoma to a lymph node (1/1)  - Focus of metastatic carcinoma measures 2.5 mm and shows extranodal  extension   C. LYMPH NODE, LEFT #2, SENTINEL, BIOPSY:  - Lymph node, negative for carcinoma (0/1)   D. LYMPH NODE, LEFT #3, SENTINEL, BIOPSY:  - Lymph node, negative for carcinoma (0/1)   E. BREAST, LEFT, LUMPECTOMY:  - Invasive ductal carcinoma, grade 2, 1.8 cm  - Ductal carcinoma in situ, intermediate grade  - Invasive carcinoma focally involves the anterior margin  - DCIS is less than 1 mm from the superior margin  - Background breast parenchyma with fibrocystic change, usual ductal  hyperplasia and intraductal papilloma.  - Biopsy site changes  - See oncology table    10/06/2019 Miscellaneous   Her Mammaprint showed High Risk Luminal Type B, risk of recurrence in the next 10 years is 29% in high risk disease. MPI -0.335    10/06/2019 Cancer Staging   Staging form: Breast, AJCC 8th Edition - Pathologic stage from 10/06/2019: Stage IA (pT1c, pN1a, cM0, G2, ER+, PR+, HER2-) - Signed by Sonja Quinton, MD on 10/19/2019   11/06/2019 Surgery   EXCISION NIPPLE AND AREOLA  MARGIN LEFT BREAST by Dr Alethea Andes  11/06/19    11/06/2019 Pathology Results    FINAL MICROSCOPIC DIAGNOSIS: 11/06/19  A. BREAST, LEFT CENTRAL, RE-EXCISION:  -  Residual ductal carcinoma in situ, intermediate grade  -  Margins uninvolved by carcinoma (0.4 cm; superior margin)  -  Atypical intraductal papilloma  -  Usual ductal hyperplasia  -  Benign skin with seborrheic keratosis  -  Previous surgical site changes  -  No invasive carcinoma identified    12/06/2019 - 12/20/2019 Chemotherapy   Adjuvant chemo TC q3weeks for 4 cycles starting 12/06/19. With first TC infusion she had allergy reaction to docetaxel  and carbo was not given on 12/06/19, we discontinued.   -I started her on weekly Abraxane  on 12/20/19, but 2 days after infusion  she started to feel tachycardic, hot/flushed, and lightheaded. A few days later she had dysuria, diarrhea and abdominal cramps and intermittent chest pain. She opted to d/c chemo and proceed with RT.    01/25/2020 - 03/06/2020 Radiation Therapy   Adjuvant Radiation with Dr Lurena Sally 01/25/20-03/06/20   05/2020 - 07/2020 Anti-estrogen oral therapy   I started her on antiestrogen therapy with Tamoxifen  in 03/2020. She took until 06/06/20 and 10mg  06/04/20- 06/2020 but stopped due to exacerbated IBS symptoms. She also tried 2 weeks of Exemestane  in 07/2020 but stopped due to increased Anxiety.   06/06/2020 Survivorship   Care plan delivered by Lacie Burton, NP    12/04/2020 Imaging   CT chest  IMPRESSION: 1. Marked interval worsening of parenchymal disease now involving areas of the LEFT lower lobe, lingula, RIGHT upper lobe and with nodular opacity in the RIGHT  lower lobe. Findings could be seen in the setting of COVID-19 infection. Pattern of disease also raises the question of organizing pneumonia. Pneumonitis, drug related changes are also considered. Pattern of disease would be atypical for neoplasm. Pulmonary consultation is suggested if not yet performed. Continued short interval surveillance is also suggested. 2. Post LEFT breast lumpectomy and axillary dissection. 3. Calcified coronary artery disease in LEFT and RIGHT coronary circulation. 4. Stable LEFT adrenal nodule compatible with benign adenoma. 5. Aortic atherosclerosis.   Aortic Atherosclerosis (ICD10-I70.0).   08/26/2023 Mammogram   Bilateral diagnostic mammogram IMPRESSION: No evidence for malignancy within either breast.  RECOMMENDATION: Screening mammogram in one year.(Code:[id number])         REVIEW OF SYSTEMS:   Constitutional: Denies fevers, chills or abnormal weight loss Eyes: Denies blurriness of vision Ears, nose, mouth, throat, and face: Denies mucositis or sore throat Respiratory: Denies cough, dyspnea or  wheezes Cardiovascular: Denies palpitation, chest discomfort or lower extremity swelling Gastrointestinal:  Denies nausea, heartburn or change in bowel habits Skin: Denies abnormal skin rashes Lymphatics: Denies new lymphadenopathy or easy bruising Neurological:Denies numbness, tingling or new weaknesses Behavioral/Psych: Mood is stable, no new changes  All other systems were reviewed with the patient and are negative.   VITALS:   Today's Vitals   03/23/24 1102 03/23/24 1123  BP: (!) 150/78   Pulse: (!) 54   Resp: 17   Temp: 97.6 F (36.4 C)   SpO2: 95%   Weight: 187 lb 4.8 oz (85 kg)   PainSc:  0-No pain   Body mass index is 30.93 kg/m.   Wt Readings from Last 3 Encounters:  03/23/24 187 lb 4.8 oz (85 kg)  03/21/24 185 lb 3.2 oz (84 kg)  02/15/24 184 lb 14.4 oz (83.9 kg)    Body mass index is 30.93 kg/m.  Performance status (ECOG): 1 - Symptomatic but completely ambulatory  PHYSICAL EXAM:   GENERAL:alert, no distress and comfortable SKIN: skin color, texture, turgor are normal, no rashes or significant lesions EYES: normal, Conjunctiva are pink and non-injected, sclera clear OROPHARYNX:no exudate, no erythema and lips, buccal mucosa, and tongue normal  NECK: supple, thyroid normal size, non-tender, without nodularity LYMPH:  no palpable lymphadenopathy in the cervical, axillary or inguinal LUNGS: clear to auscultation and percussion with normal breathing effort HEART: regular rate & rhythm and no murmurs and no lower extremity edema ABDOMEN:abdomen soft, non-tender and normal bowel sounds Musculoskeletal:no cyanosis of digits and no clubbing  NEURO: alert & oriented x 3 with fluent speech, no focal motor/sensory deficits BREAST: Left breast has some firm tissue adjacent to the surgical scars. No palpable lumps or masses in left breast. No nipple inversion or nipple discharge. There is no axillary lymphadenopathy on the left. There are no palpable lumps or masses in  the right breast. No nippled inversion or nipple discharge. There is no axillary lymphadenopathy on the right.   LABORATORY DATA:  I have reviewed the data as listed    Component Value Date/Time   NA 140 03/23/2024 1031   K 4.6 03/23/2024 1031   CL 106 03/23/2024 1031   CO2 28 03/23/2024 1031   GLUCOSE 108 (H) 03/23/2024 1031   BUN 23 03/23/2024 1031   CREATININE 1.03 (H) 03/23/2024 1031   CREATININE 0.92 08/14/2020 1025   CALCIUM  9.0 03/23/2024 1031   PROT 6.7 03/23/2024 1031   ALBUMIN 4.1 03/23/2024 1031   AST 13 (L) 03/23/2024 1031   ALT 15 03/23/2024 1031   ALKPHOS 63 03/23/2024  1031   BILITOT 0.4 03/23/2024 1031   GFRNONAA 55 (L) 03/23/2024 1031   GFRAA >60 02/12/2020 1230   GFRAA >60 01/03/2020 1241    Lab Results  Component Value Date   WBC 6.1 03/23/2024   NEUTROABS 4.1 03/23/2024   HGB 13.2 03/23/2024   HCT 39.6 03/23/2024   MCV 86.8 03/23/2024   PLT 263 03/23/2024

## 2024-03-23 NOTE — Assessment & Plan Note (Addendum)
 Stage IA, c(T1bN0M0), ER/PR+, HER2-, Grade II, pT1cN1aM0, stage IA  -Diagnosed in 08/2019, s/p B/l breast lumpectomy and SLNB with Dr. Alethea Andes on 10/06/19. Path showed: 1.8 cm IDC and DCIS, anterior margin focally positive; one positive lymph node with extranodal extension (1/3). Re-excision on 11/06/19 showed DCIS only, margins negative.  -Mammaprint showed high risk. She did not tolerate TC (allergic reaction to taxol ) or Abraxane  (symptomatic tachycardia, GI symptoms). -She completed adjuvant RT with Dr Lurena Sally 01/25/20 - 03/06/20.  -She declined genetic testing -She took antiestrogen therapy over 2 months 05/2020-07/2020 and stopped due to poor toleration/side effects (exacerbated IBS from tamoxifen ; bone concerns and anxiety from exemestane , declined to restart with SSRI). -3D diagnostic mammogram was done in 08/26/2023 and was negative for evidence of malignancy.  New order for diagnostic mammogram will be scheduled for 08/2024.  -Follow-up in 6 months, or sooner if needed

## 2024-03-23 NOTE — Telephone Encounter (Signed)
 Copied from CRM [id number]. Topic: Clinical - Medical Advice >> Mar 23, 2024  8:24 AM Adonis Hoot wrote: Reason for CRM: Patient called in stating that she just started cholesterol injections,however her recent  labs show that her cholesterol looks good.She is wondering should she still take the cholesterol injections?

## 2024-03-24 ENCOUNTER — Encounter (HOSPITAL_COMMUNITY)
Admission: RE | Admit: 2024-03-24 | Discharge: 2024-03-24 | Disposition: A | Discharge status: Home / Self Care | Source: Ambulatory Visit | Attending: Cardiovascular Disease

## 2024-03-24 ENCOUNTER — Telehealth: Payer: Self-pay | Admitting: Nurse Practitioner

## 2024-03-24 DIAGNOSIS — Z955 Presence of coronary angioplasty implant and graft: Secondary | ICD-10-CM

## 2024-03-24 NOTE — Telephone Encounter (Signed)
 Scheduled appointments with the patient. Te patient is aware of the appointment details.

## 2024-03-24 NOTE — Telephone Encounter (Signed)
 Patient informed of the message below and voiced understanding

## 2024-03-24 NOTE — Telephone Encounter (Signed)
 Patient and husband called in to follow up. She says LDL has greatly improved and is back within normal range. She would like to know if she still needs to begin injection on Sunday. Please advise.

## 2024-03-27 ENCOUNTER — Telehealth (HOSPITAL_COMMUNITY): Payer: Self-pay

## 2024-03-27 ENCOUNTER — Encounter (HOSPITAL_COMMUNITY): Admission: RE | Admit: 2024-03-27 | Source: Ambulatory Visit

## 2024-03-27 NOTE — Telephone Encounter (Signed)
 Patient left message calling out for 1:45pm class, stated they are "not feeling up to coming" today but will be back Wednesday.

## 2024-03-29 ENCOUNTER — Encounter: Payer: Self-pay | Admitting: Nurse Practitioner

## 2024-03-29 ENCOUNTER — Encounter (HOSPITAL_COMMUNITY)
Admission: RE | Admit: 2024-03-29 | Discharge: 2024-03-29 | Disposition: A | Discharge status: Home / Self Care | Source: Ambulatory Visit | Attending: Cardiovascular Disease

## 2024-03-29 DIAGNOSIS — Z955 Presence of coronary angioplasty implant and graft: Secondary | ICD-10-CM | POA: Diagnosis not present

## 2024-03-30 ENCOUNTER — Other Ambulatory Visit (HOSPITAL_COMMUNITY): Payer: Self-pay

## 2024-03-30 NOTE — Telephone Encounter (Signed)
Sent patient a mychart response.

## 2024-03-31 ENCOUNTER — Encounter (HOSPITAL_COMMUNITY)
Admission: RE | Admit: 2024-03-31 | Discharge: 2024-03-31 | Disposition: A | Discharge status: Home / Self Care | Source: Ambulatory Visit | Attending: Cardiovascular Disease | Admitting: Cardiovascular Disease

## 2024-03-31 DIAGNOSIS — Z955 Presence of coronary angioplasty implant and graft: Secondary | ICD-10-CM | POA: Diagnosis not present

## 2024-04-03 ENCOUNTER — Encounter (HOSPITAL_COMMUNITY)
Admission: RE | Admit: 2024-04-03 | Discharge: 2024-04-03 | Disposition: A | Discharge status: Home / Self Care | Source: Ambulatory Visit | Attending: Cardiovascular Disease | Admitting: Cardiovascular Disease

## 2024-04-03 DIAGNOSIS — Z955 Presence of coronary angioplasty implant and graft: Secondary | ICD-10-CM | POA: Diagnosis not present

## 2024-04-05 ENCOUNTER — Encounter (HOSPITAL_COMMUNITY)
Admission: RE | Admit: 2024-04-05 | Discharge: 2024-04-05 | Disposition: A | Discharge status: Home / Self Care | Source: Ambulatory Visit | Attending: Cardiovascular Disease | Admitting: Cardiovascular Disease

## 2024-04-05 DIAGNOSIS — Z955 Presence of coronary angioplasty implant and graft: Secondary | ICD-10-CM

## 2024-04-05 NOTE — Progress Notes (Signed)
 Cardiac Individual Treatment Plan  Patient Details  Name: Shelly [name] MRN: [id number] Date of Birth: [birth year] Referring Provider:   Flowsheet Row INTENSIVE CARDIAC REHAB ORIENT from 02/01/2024 in [facility]  Referring Provider Magnus Schuller, MD       Initial Encounter Date:  Flowsheet Row INTENSIVE CARDIAC REHAB ORIENT from 02/01/2024 in [facility]  Date 02/01/24       Visit Diagnosis: 01/18/24 Status post coronary artery stent placement RCA  Patient's Home Medications on Admission:  Current Outpatient Medications:    acetaminophen  (TYLENOL ) 500 MG tablet, Take 500 mg by mouth every 6 (six) hours as needed for headache or mild pain., Disp: , Rfl:    Alirocumab  (PRALUENT ) 150 MG/ML SOAJ, Inject 1 mL (150 mg total) into the skin every 14 (fourteen) days., Disp: 6 mL, Rfl: 1   aspirin  EC 81 MG tablet, Take 1 tablet (81 mg total) by mouth daily. Swallow whole., Disp: 90 tablet, Rfl: 2   clidinium-chlordiazePOXIDE  (LIBRAX ) 5-2.5 MG capsule, TAKE 1 CAPSULE EVERY DAY AS NEEDED (Patient taking differently: Take 1 capsule by mouth daily as needed (bowel issue).), Disp: 90 capsule, Rfl: 0   denosumab  (PROLIA ) 60 MG/ML SOSY injection, Inject 60 mg into the skin every 6 (six) months. Last inj was in October, Disp: , Rfl:    loratadine (CLARITIN) 10 MG tablet, Take 10 mg by mouth daily., Disp: , Rfl:    metoprolol  succinate (TOPROL -XL) 25 MG 24 hr tablet, Take 1 tablet (25 mg total) by mouth daily., Disp: 90 tablet, Rfl: 0   nitroGLYCERIN  (NITROSTAT ) 0.4 MG SL tablet, Place 1 tablet (0.4 mg total) under the tongue every 5 (five) minutes x 3 doses as needed for chest pain., Disp: 25 tablet, Rfl: 2   rosuvastatin  (CRESTOR ) 10 MG tablet, Take 1 tablet (10 mg total) by mouth daily., Disp: 90 tablet, Rfl: 0   ticagrelor  (BRILINTA ) 90 MG TABS tablet, Take 1 tablet (90 mg total) by  mouth 2 (two) times daily., Disp: 180 tablet, Rfl: 2  Past Medical History: Past Medical History:  Diagnosis Date   Allergy    Arthritis    wrist, shoulder    Cancer (HCC) 08/2019   left breast IDC   Diverticulitis    Endometrial polyp    Family history of breast cancer    GERD (gastroesophageal reflux disease)    occasional - diet controlled   Hx of adenomatous colonic polyps    Hyperlipidemia    IBS (irritable bowel syndrome)    Missed abortion    x 1 - resolved - no surgery   Osteoporosis    SVD (spontaneous vaginal delivery)    x 1   Transient vision disturbance 2019   was evaluated by ED, no findings   Vitamin D  deficiency     Tobacco Use: Social History   Tobacco Use  Smoking Status Former   Current packs/day: 0.00   Average packs/day: 0.1 packs/day for 20.0 years (2.0 ttl pk-yrs)   Types: Cigarettes   Start date: 05/15/1987   Quit date: 05/15/2007   Years since quitting: 16.9  Smokeless Tobacco Never    Labs: Review Flowsheet  More data exists      Latest Ref Rng & Units 10/22/2017 08/05/2018 08/14/2020 01/18/2024 03/22/2024  Labs for ITP Cardiac and Pulmonary Rehab  Cholestrol 0 - 200 mg/dL - - - 782  956   LDL (calc)  0 - 99 mg/dL - - - 295  65   HDL-C >28.41 mg/dL - - - 50  32.44   Trlycerides 0.0 - 149.0 mg/dL - - - 010  272.5   Hemoglobin A1c 4.8 - 5.6 % 6.3  6.1  6.6  6.7  -    Capillary Blood Glucose: Lab Results  Component Value Date   GLUCAP 98 02/07/2024   GLUCAP 109 (H) 02/07/2024   GLUCAP 152 (H) 01/19/2024   GLUCAP 129 (H) 01/18/2024   GLUCAP 98 01/18/2024     Exercise Target Goals: Exercise Program Goal: Individual exercise prescription set using results from initial 6 min walk test and THRR while considering  patient's activity barriers and safety.   Exercise Prescription Goal: Initial exercise prescription builds to 30-45 minutes a day of aerobic activity, 2-3 days per week.  Home exercise guidelines will be given to patient during  program as part of exercise prescription that the participant will acknowledge.  Activity Barriers & Risk Stratification:  Activity Barriers & Cardiac Risk Stratification - 02/01/24 1158       Activity Barriers & Cardiac Risk Stratification   Activity Barriers Arthritis;Back Problems;Joint Problems;Deconditioning;Neck/Spine Problems;Balance Concerns    Cardiac Risk Stratification High             6 Minute Walk:  6 Minute Walk     Row Name 02/01/24 1130         6 Minute Walk   Phase Initial     Distance 1256 feet     Walk Time 6 minutes     # of Rest Breaks 0     MPH 2.38     METS 2.01     RPE 9     Perceived Dyspnea  0     VO2 Peak 7.1     Symptoms Yes (comment)     Comments chronic low back pain 3/10     Resting HR 62 bpm     Resting BP 128/60     Resting Oxygen Saturation  93 %     Exercise Oxygen Saturation  during 6 min walk 96 %     Max Ex. HR 76 bpm     Max Ex. BP 156/70     2 Minute Post BP 130/78              Oxygen Initial Assessment:   Oxygen Re-Evaluation:   Oxygen Discharge (Final Oxygen Re-Evaluation):   Initial Exercise Prescription:  Initial Exercise Prescription - 02/01/24 1200       Date of Initial Exercise RX and Referring Provider   Date 02/01/24    Referring Provider Magnus Schuller, MD    Expected Discharge Date 04/26/24      NuStep   Level 1    SPM 75    Minutes 30    METs 2      Prescription Details   Frequency (times per week) 3    Duration Progress to 30 minutes of continuous aerobic without signs/symptoms of physical distress      Intensity   THRR 40-80% of Max Heartrate 56-113    Ratings of Perceived Exertion 11-13    Perceived Dyspnea 0-4      Progression   Progression Continue progressive overload as per policy without signs/symptoms or physical distress.      Resistance Training   Training Prescription Yes    Weight 2 lbs    Reps 10-15  Perform Capillary Blood Glucose checks as  needed.  Exercise Prescription Changes:   Exercise Prescription Changes     Row Name 02/07/24 1600 02/21/24 1630 03/10/24 1600 03/22/24 1600       Response to Exercise   Blood Pressure (Admit) 124/70 132/64 128/70 138/60    Blood Pressure (Exercise) 140/56 144/62 --  over 10 visits --  over 10 sessions, no exercise BP    Blood Pressure (Exit) 104/72 118/64 132/62 138/72    Heart Rate (Admit) 60 bpm 58 bpm 64 bpm 64 bpm    Heart Rate (Exercise) 72 bpm 83 bpm 88 bpm 82 bpm    Heart Rate (Exit) 66 bpm 59 bpm 62 bpm 65 bpm    Rating of Perceived Exertion (Exercise) 11 11.5 12 12     Perceived Dyspnea (Exercise) 0 0 0 0    Symptoms 6/10 Right arm pain -- none none    Comments Pt first day in CRP2 program Reviewed MET's and goals Reviewed METs with pt Reviewed METs, goals, home ExRx    Duration Progress to 30 minutes of  aerobic without signs/symptoms of physical distress Progress to 30 minutes of  aerobic without signs/symptoms of physical distress Continue with 30 min of aerobic exercise without signs/symptoms of physical distress. Continue with 30 min of aerobic exercise without signs/symptoms of physical distress.    Intensity -- THRR unchanged THRR unchanged THRR unchanged      Progression   Progression Continue to progress workloads to maintain intensity without signs/symptoms of physical distress. Continue to progress workloads to maintain intensity without signs/symptoms of physical distress. Continue to progress workloads to maintain intensity without signs/symptoms of physical distress. Continue to progress workloads to maintain intensity without signs/symptoms of physical distress.    Average METs 1.5 2.41 2.4 2.26      Resistance Training   Training Prescription Yes Yes Yes No  no wts on wed    Weight 2 2 2  --    Reps 10-15 10-15 10-15 --    Time -- 10 Minutes 10 Minutes --      Interval Training   Interval Training No No No No      NuStep   Level 1 1 2 2     SPM 55 73 79 59     Minutes 19 15 15 15     METs 1.5 1.9 1.9 1.6      Track   Laps -- 15 15 15     Minutes -- 15 15 15     METs -- 2.91 2.91 2.91      Home Exercise Plan   Plans to continue exercise at -- -- -- Home (comment)  walking    Frequency -- -- -- Add 2 additional days to program exercise sessions.    Initial Home Exercises Provided -- -- -- 03/22/24             Exercise Comments:   Exercise Comments     Row Name 02/21/24 1630 03/10/24 1632 03/22/24 1646       Exercise Comments Reviewed MET's and goals. Pt tolerated exercise well with an average MET level of 2.41. She is feeling much stronger and has increased to 30 mins exercise on two modalities. Shes doing great with walking the track and is increasing her balance. Reviewed METs with pt today, pt has plateaued with an avg MET level of 2.4. Pt was able to increase workload on nustep to lvl 2 and tolerated well without s/sx. Discussed how increasing workloads and spm  impacts MET levels and pt voiced understanding. Pt stated lvl 2 felt good and was excited about increase. Reviewed METs, goals, and home ExRx. Discussed continuing to progress MET levels and techniques to work towards some pt goals. Pt will add 1-2 days in addition to CRP2 of walking 15 minutes and walking dog.              Exercise Goals and Review:   Exercise Goals     Row Name 02/01/24 1054             Exercise Goals   Increase Physical Activity Yes       Intervention Provide advice, education, support and counseling about physical activity/exercise needs.;Develop an individualized exercise prescription for aerobic and resistive training based on initial evaluation findings, risk stratification, comorbidities and participant's personal goals.       Expected Outcomes Short Term: Attend rehab on a regular basis to increase amount of physical activity.;Long Term: Add in home exercise to make exercise part of routine and to increase amount of physical activity.;Long  Term: Exercising regularly at least 3-5 days a week.       Increase Strength and Stamina Yes       Intervention Provide advice, education, support and counseling about physical activity/exercise needs.;Develop an individualized exercise prescription for aerobic and resistive training based on initial evaluation findings, risk stratification, comorbidities and participant's personal goals.       Expected Outcomes Short Term: Increase workloads from initial exercise prescription for resistance, speed, and METs.;Short Term: Perform resistance training exercises routinely during rehab and add in resistance training at home;Long Term: Improve cardiorespiratory fitness, muscular endurance and strength as measured by increased METs and functional capacity ( )       Able to understand and use rate of perceived exertion (RPE) scale Yes       Intervention Provide education and explanation on how to use RPE scale       Expected Outcomes Short Term: Able to use RPE daily in rehab to express subjective intensity level;Long Term:  Able to use RPE to guide intensity level when exercising independently       Knowledge and understanding of Target Heart Rate Range (THRR) Yes       Intervention Provide education and explanation of THRR including how the numbers were predicted and where they are located for reference       Expected Outcomes Short Term: Able to state/look up THRR;Long Term: Able to use THRR to govern intensity when exercising independently;Short Term: Able to use daily as guideline for intensity in rehab       Understanding of Exercise Prescription Yes       Intervention Provide education, explanation, and written materials on patient's individual exercise prescription       Expected Outcomes Short Term: Able to explain program exercise prescription;Long Term: Able to explain home exercise prescription to exercise independently                Exercise Goals Re-Evaluation :  Exercise Goals  Re-Evaluation     Row Name 02/07/24 1649 02/21/24 1630 03/10/24 1631 03/22/24 1634       Exercise Goal Re-Evaluation   Exercise Goals Review Increase Physical Activity;Understanding of Exercise Prescription;Increase Strength and Stamina;Knowledge and understanding of Target Heart Rate Range (THRR);Able to understand and use rate of perceived exertion (RPE) scale Increase Physical Activity;Understanding of Exercise Prescription;Increase Strength and Stamina;Knowledge and understanding of Target Heart Rate Range (THRR);Able to understand and use rate of perceived  exertion (RPE) scale -- Increase Physical Activity;Understanding of Exercise Prescription;Increase Strength and Stamina;Knowledge and understanding of Target Heart Rate Range (THRR);Able to understand and use rate of perceived exertion (RPE) scale    Comments Pt first day in CRP2 program. Pt tolerated Nustep for 19 min and stopped due to 6/10 R arm pain, no other s/sx reported. Pt avg METs 1.5. Pt is learning her RPE scale, THRR, and ExRx. Reviewed MET's and goals. Pt tolerated exercise well with an average MET level of 2.41. She is feeling much stronger and has increased to 30 mins exercise on two modalities. Shes doing great with walking the track and is increasing her balance. -- Reviewed MET's, goals, home ExRx. Pt tolerated exercise well with an average MET level of 2.26. She has maintained her MET levels constently on the nustep and track. Discussed increasing workload on nustep soon. Pt feels okay about goals, discussed healthy sleep habits such as creating routine, turning off screens, and trying relaxation techniques to help pt relax before bedtime and she was agreeable to trying them. Discussed home ExRx and starting a walking program, pt feels she can tolerate 15 minutes of walking 1-2 days per week in addition to CRP2. Encouraged pt to try walking 2 15 minute sessions per day, pt said she would do one and walk the other with her dog.     Expected Outcomes Will continue to progress workloads as tolerated without s/sx. Will continue to progress workloads as tolerated without s/sx. -- Will continue to progress workloads as tolerated without s/sx.             Discharge Exercise Prescription (Final Exercise Prescription Changes):  Exercise Prescription Changes - 03/22/24 1600       Response to Exercise   Blood Pressure (Admit) 138/60    Blood Pressure (Exercise) --   over 10 sessions, no exercise BP   Blood Pressure (Exit) 138/72    Heart Rate (Admit) 64 bpm    Heart Rate (Exercise) 82 bpm    Heart Rate (Exit) 65 bpm    Rating of Perceived Exertion (Exercise) 12    Perceived Dyspnea (Exercise) 0    Symptoms none    Comments Reviewed METs, goals, home ExRx    Duration Continue with 30 min of aerobic exercise without signs/symptoms of physical distress.    Intensity THRR unchanged      Progression   Progression Continue to progress workloads to maintain intensity without signs/symptoms of physical distress.    Average METs 2.26      Resistance Training   Training Prescription No   no wts on wed     Interval Training   Interval Training No      NuStep   Level 2    SPM 59    Minutes 15    METs 1.6      Track   Laps 15    Minutes 15    METs 2.91      Home Exercise Plan   Plans to continue exercise at Home (comment)   walking   Frequency Add 2 additional days to program exercise sessions.    Initial Home Exercises Provided 03/22/24             Nutrition:  Target Goals: Understanding of nutrition guidelines, daily intake of sodium 1500mg , cholesterol 200mg , calories 30% from fat and 7% or less from saturated fats, daily to have 5 or more servings of fruits and vegetables.  Biometrics:  Pre Biometrics - 02/01/24 1048  Pre Biometrics   Waist Circumference 43.5 inches    Hip Circumference 43 inches    Waist to Hip Ratio 1.01 %    Triceps Skinfold 23 mm    % Body Fat 43.4 %    Grip  Strength 18 kg    Flexibility --   Not performed 4/10 low back pain today   Single Leg Stand 11.5 seconds              Nutrition Therapy Plan and Nutrition Goals:  Nutrition Therapy & Goals - 03/06/24 1438       Nutrition Therapy   Diet Heart Healthy Diet    Drug/Food Interactions Statins/Certain Fruits      Personal Nutrition Goals   Nutrition Goal Patient to identify strategies for reducing cardiovascular risk by attending the [facility] education and nutrition series weekly.   goal in action   Personal Goal #2 Patient to improve diet quality by using the plate method as a guide for meal planning to include lean protein/plant protein, fruits, vegetables, whole grains, nonfat dairy as part of a well-balanced diet.   goal in action.   Comments Goals in action. Shelly [name] has medical history of CAD, HTN, DM2,s/p coronary artery stent placement. Shelly [name] reports that she has started making some dietary changes including decreased saturated fat intake and increased omega 3 rich fish intake. She has decreased refined carbohydrate intake.She is attending the [facility] education series regularly. LDL remains elevated; she is scheduled to see the lipid clinic. A1c is at goal <7%. She is down 1.1# since starting with our program. Patient will benefit from participation in intensive cardiac rehab for nutrition, exercise, and lifestyle modification.      Intervention Plan   Intervention Prescribe, educate and counsel regarding individualized specific dietary modifications aiming towards targeted core components such as weight, hypertension, lipid management, diabetes, heart failure and other comorbidities.;Nutrition handout(s) given to patient.    Expected Outcomes Short Term Goal: Understand basic principles of dietary content, such as calories, fat, sodium, cholesterol and nutrients.;Long Term Goal: Adherence to prescribed nutrition plan.             Nutrition Assessments:  Nutrition Assessments -  02/07/24 1604       Rate Your Plate Scores   Pre Score 64            MEDIFICTS Score Key: >=70 Need to make dietary changes  40-70 Heart Healthy Diet <= 40 Therapeutic Level Cholesterol Diet   Flowsheet Row INTENSIVE CARDIAC REHAB from 02/07/2024 in [facility]  Picture Your Plate Total Score on Admission 64      Picture Your Plate Scores: <16 Unhealthy dietary pattern with much room for improvement. 41-50 Dietary pattern unlikely to meet recommendations for good health and room for improvement. 51-60 More healthful dietary pattern, with some room for improvement.  >60 Healthy dietary pattern, although there may be some specific behaviors that could be improved.    Nutrition Goals Re-Evaluation:  Nutrition Goals Re-Evaluation     Row Name 02/07/24 1600 03/06/24 1438           Goals   Current Weight 187 lb 13.3 oz (85.2 kg) 186 lb 11.7 oz (84.7 kg)      Comment A1c 6.7, Lpa 154, cholesterol 266, triglycerides 155, LDL 185 no new labs; most recent labs A1c 6.7, Lpa 154, cholesterol 266, triglycerides 155, LDL 185      Expected Outcome Shelly [name] has medical history  of CAD, HTN, DM2,s/p coronary artery stent placement. Shelly [name] reports that she has started making some dietary changes including decreased saturated fat intake and increased omega 3 rich fish intake. She reports lengthy history of IBS and is apprehensive to attending the [facility] nutrition classes. LDL remains elevated; she is scheduled to see the lipid clinic. A1c is <7%. Patient will benefit from participation in intensive cardiac rehab for nutrition, exercise, and lifestyle modification. Goals in action. Shelly [name] has medical history of CAD, HTN, DM2,s/p coronary artery stent placement. Shelly [name] reports that she has started making some dietary changes including decreased saturated fat intake and increased omega 3 rich fish intake. She has decreased refined carbohydrate intake.She  is attending the [facility] education series regularly. LDL remains elevated; she is scheduled to see the lipid clinic. A1c is at goal <7%. She is down 1.1# since starting with our program. Patient will benefit from participation in intensive cardiac rehab for nutrition, exercise, and lifestyle modification.               Nutrition Goals Re-Evaluation:  Nutrition Goals Re-Evaluation     Row Name 02/07/24 1600 03/06/24 1438           Goals   Current Weight 187 lb 13.3 oz (85.2 kg) 186 lb 11.7 oz (84.7 kg)      Comment A1c 6.7, Lpa 154, cholesterol 266, triglycerides 155, LDL 185 no new labs; most recent labs A1c 6.7, Lpa 154, cholesterol 266, triglycerides 155, LDL 185      Expected Outcome Shelly [name] has medical history of CAD, HTN, DM2,s/p coronary artery stent placement. Shelly [name] reports that she has started making some dietary changes including decreased saturated fat intake and increased omega 3 rich fish intake. She reports lengthy history of IBS and is apprehensive to attending the [facility] nutrition classes. LDL remains elevated; she is scheduled to see the lipid clinic. A1c is <7%. Patient will benefit from participation in intensive cardiac rehab for nutrition, exercise, and lifestyle modification. Goals in action. Shelly [name] has medical history of CAD, HTN, DM2,s/p coronary artery stent placement. Shelly [name] reports that she has started making some dietary changes including decreased saturated fat intake and increased omega 3 rich fish intake. She has decreased refined carbohydrate intake.She is attending the [facility] education series regularly. LDL remains elevated; she is scheduled to see the lipid clinic. A1c is at goal <7%. She is down 1.1# since starting with our program. Patient will benefit from participation in intensive cardiac rehab for nutrition, exercise, and lifestyle modification.               Nutrition Goals Discharge (Final Nutrition Goals Re-Evaluation):  Nutrition Goals Re-Evaluation -  03/06/24 1438       Goals   Current Weight 186 lb 11.7 oz (84.7 kg)    Comment no new labs; most recent labs A1c 6.7, Lpa 154, cholesterol 266, triglycerides 155, LDL 185    Expected Outcome Goals in action. Shelly [name] has medical history of CAD, HTN, DM2,s/p coronary artery stent placement. Shelly [name] reports that she has started making some dietary changes including decreased saturated fat intake and increased omega 3 rich fish intake. She has decreased refined carbohydrate intake.She is attending the [facility] education series regularly. LDL remains elevated; she is scheduled to see the lipid clinic. A1c is at goal <7%. She is down 1.1# since starting with our program. Patient will benefit from participation in intensive cardiac rehab for nutrition, exercise, and lifestyle modification.  Psychosocial: Target Goals: Acknowledge presence or absence of significant depression and/or stress, maximize coping skills, provide positive support system. Participant is able to verbalize types and ability to use techniques and skills needed for reducing stress and depression.  Initial Review & Psychosocial Screening:  Initial Psych Review & Screening - 02/01/24 1205       Initial Review   Current issues with Current Sleep Concerns      Family Dynamics   Good Support System? Yes   Pt has spouse at home for support     Barriers   Psychosocial barriers to participate in program There are no identifiable barriers or psychosocial needs.      Screening Interventions   Interventions Encouraged to exercise             Quality of Life Scores:  Quality of Life - 02/01/24 1328       Quality of Life   Select Quality of Life      Quality of Life Scores   Health/Function Pre 25.33 %    Socioeconomic Pre 21.79 %    Psych/Spiritual Pre 26.29 %    Family Pre 28.3 %    GLOBAL Pre 25.24 %            Scores of 19 and below usually indicate a poorer quality of life in these areas.  A  difference of  2-3 points is a clinically meaningful difference.  A difference of 2-3 points in the total score of the Quality of Life Index has been associated with significant improvement in overall quality of life, self-image, physical symptoms, and general health in studies assessing change in quality of life.  PHQ-9: Review Flowsheet  More data exists      02/01/2024 08/23/2023 11/24/2022 08/17/2022 01/12/2022  Depression screen PHQ 2/9  Decreased Interest 0 0 0 0 0  Down, Depressed, Hopeless 0 0 0 0 0  PHQ - 2 Score 0 0 0 0 0  Altered sleeping 2 - - - -  Tired, decreased energy 1 - - - -  Change in appetite 0 - - - -  Feeling bad or failure about yourself  0 - - - -  Trouble concentrating 0 - - - -  Moving slowly or fidgety/restless 0 - - - -  Suicidal thoughts 0 - - - -  PHQ-9 Score 3 - - - -  Difficult doing work/chores Somewhat difficult - - - -   Interpretation of Total Score  Total Score Depression Severity:  1-4 = Minimal depression, 5-9 = Mild depression, 10-14 = Moderate depression, 15-19 = Moderately severe depression, 20-27 = Severe depression   Psychosocial Evaluation and Intervention:   Psychosocial Re-Evaluation:  Psychosocial Re-Evaluation     Row Name 02/07/24 1638 03/08/24 1259 04/05/24 0813         Psychosocial Re-Evaluation   Current issues with Current Sleep Concerns;Current Anxiety/Panic Current Sleep Concerns;Current Anxiety/Panic Current Sleep Concerns;Current Anxiety/Panic     Comments Shelly [name] said she was nervous about starting exercise but felt better after exercise was over. Will discuss PHQ 9 in the upcoming week Reviewed PHQ-9 Shelly [name] says she has ongoing issues with insomnia. Shelly [name] says that her energy level has improved since she has been participaitng in cardiac rehab. Shelly [name] says that participating in cardiac rehab has helped and she does not feels as anxious     Expected Outcomes Shelly [name] will have decreased or controlled anxiety and better sleepipng habits upon  completion of cardiac rehab. Shelly [name] will have  decreased or controlled anxiety and better sleepipng habits upon completion of cardiac rehab. Shelly [name] will have decreased or controlled anxiety and better sleepipng habits upon completion of cardiac rehab.     Interventions Stress management education;Relaxation education;Encouraged to attend Cardiac Rehabilitation for the exercise Stress management education;Relaxation education;Encouraged to attend Cardiac Rehabilitation for the exercise Stress management education;Relaxation education;Encouraged to attend Cardiac Rehabilitation for the exercise     Continue Psychosocial Services  Follow up required by staff Follow up required by staff Follow up required by staff              Psychosocial Discharge (Final Psychosocial Re-Evaluation):  Psychosocial Re-Evaluation - 04/05/24 0813       Psychosocial Re-Evaluation   Current issues with Current Sleep Concerns;Current Anxiety/Panic    Comments Shelly [name] says that participating in cardiac rehab has helped and she does not feels as anxious    Expected Outcomes Shelly [name] will have decreased or controlled anxiety and better sleepipng habits upon completion of cardiac rehab.    Interventions Stress management education;Relaxation education;Encouraged to attend Cardiac Rehabilitation for the exercise    Continue Psychosocial Services  Follow up required by staff             Vocational Rehabilitation: Provide vocational rehab assistance to qualifying candidates.   Vocational Rehab Evaluation & Intervention:  Vocational Rehab - 02/01/24 1206       Initial Vocational Rehab Evaluation & Intervention   Assessment shows need for Vocational Rehabilitation No   Pt is retired            Education: Education Goals: Education classes will be provided on a weekly basis, covering required topics. Participant will state understanding/return demonstration of topics presented.    Education     Row Name 02/07/24 1600      Education   Cardiac Education Topics Pritikin   Geographical information systems officer Psychosocial   Psychosocial Workshop Focused Goals, Sustainable Changes   Instruction Review Code 1- Verbalizes Understanding   Class Start Time 1345   Class Stop Time 1443   Class Time Calculation (min) 58 min    Row Name 02/09/24 1600     Education   Cardiac Education Topics Pritikin   Customer service manager   Weekly Topic Comforting Weekend Breakfasts   Instruction Review Code 1- Verbalizes Understanding   Class Start Time 1400   Class Stop Time 1440   Class Time Calculation (min) 40 min    Row Name 02/11/24 1500     Education   Cardiac Education Topics Pritikin   Licensed conveyancer Nutrition   Nutrition Dining Out - Part 1   Instruction Review Code 1- Verbalizes Understanding   Class Start Time 1400   Class Stop Time 1440   Class Time Calculation (min) 40 min    Row Name 02/16/24 1500     Education   Cardiac Education Topics Pritikin   Customer service manager   Weekly Topic Fast Evening Meals   Instruction Review Code 1- Verbalizes Understanding   Class Start Time 1400   Class Stop Time 1440   Class Time Calculation (min) 40 min    Row Name 02/18/24 1400     Education   Cardiac Education Topics Pritikin  Select Core Videos     Core Videos   Educator Dietitian   Nutrition Vitamins and Minerals   Instruction Review Code 1- Verbalizes Understanding   Class Start Time 1400   Class Stop Time 1440   Class Time Calculation (min) 40 min    Row Name 02/21/24 1500     Education   Cardiac Education Topics Pritikin   Glass blower/designer Nutrition   Nutrition Workshop Fueling a Forensic psychologist   Instruction Review Code 1- Tax inspector   Class Start Time  1400   Class Stop Time 1440   Class Time Calculation (min) 40 min    Row Name 02/23/24 1500     Education   Cardiac Education Topics Pritikin   Customer service manager   Weekly Topic International Cuisine- Spotlight on the [employer] Zones   Instruction Review Code 1- Verbalizes Understanding   Class Start Time 1400   Class Stop Time 1440   Class Time Calculation (min) 40 min    Row Name 02/25/24 1400     Education   Cardiac Education Topics Pritikin   Psychologist, forensic Exercise Education   Exercise Education Improving Performance   Instruction Review Code 1- Verbalizes Understanding   Class Start Time 1400   Class Stop Time 1435   Class Time Calculation (min) 35 min    Row Name 02/28/24 1600     Education   Cardiac Education Topics Pritikin   Select Workshops     Workshops   Educator Exercise Physiologist   Select Psychosocial   Psychosocial Workshop Healthy Sleep for a Healthy Heart   Instruction Review Code 1- Verbalizes Understanding   Class Start Time 1403   Class Stop Time 1457   Class Time Calculation (min) 54 min    Row Name 03/01/24 1400     Education   Cardiac Education Topics Pritikin   Customer service manager   Weekly Topic Simple Sides and Sauces   Instruction Review Code 1- Verbalizes Understanding   Class Start Time 1355   Class Stop Time 1431   Class Time Calculation (min) 36 min    Row Name 03/06/24 1500     Education   Cardiac Education Topics Pritikin   Select Workshops     Workshops   Educator Exercise Physiologist   Select Exercise   Exercise Workshop Managing Heart Disease: Your Path to a Healthier Heart   Instruction Review Code 1- Verbalizes Understanding   Class Start Time 1400   Class Stop Time 1448   Class Time Calculation (min) 48 min    Row Name 03/08/24 1500     Education   Cardiac  Education Topics Pritikin  Simultaneous filing. User may not have seen previous data.   Dealer. User may not have seen previous data.     Ecologist. User may not have seen previous data.   Weekly Topic Powerhouse Plant-Based Proteins  Simultaneous filing. User may not have seen previous data.   Instruction Review Code 1- Verbalizes Understanding  Simultaneous filing. User may not have seen previous data.   Class Start Time 1400  Simultaneous filing. User may not have seen previous data.  Class Stop Time 1435  Simultaneous filing. User may not have seen previous data.   Class Time Calculation (min) 35 min  Simultaneous filing. User may not have seen previous data.    Row Name 03/10/24 1400     Education   Cardiac Education Topics Pritikin   Psychologist, forensic General Education   General Education Hypertension and Heart Disease   Instruction Review Code 1- Verbalizes Understanding   Class Start Time 1350   Class Stop Time 1433   Class Time Calculation (min) 43 min    Row Name 03/13/24 1600     Education   Cardiac Education Topics Pritikin   Geographical information systems officer Psychosocial   Psychosocial Workshop From Head to Heart: The Power of a Healthy Outlook   Instruction Review Code 1- Verbalizes Understanding   Class Start Time 1400   Class Stop Time 1149   Class Time Calculation (min) 1309 min    Row Name 03/15/24 1600     Education   Cardiac Education Topics Pritikin   Customer service manager   Weekly Topic Tasty Appetizers and Snacks   Instruction Review Code 1- Verbalizes Understanding   Class Start Time 1400   Class Stop Time 1445   Class Time Calculation (min) 45 min    Row Name 03/20/24 1400     Education   Cardiac Education Topics  Pritikin   Nurse, children's Exercise Physiologist   Select Psychosocial   Psychosocial Healthy Minds, Bodies, Hearts   Instruction Review Code 1- Verbalizes Understanding   Class Start Time 1410   Class Stop Time 1445   Class Time Calculation (min) 35 min    Row Name 03/22/24 1400     Education   Cardiac Education Topics Pritikin   Customer service manager   Weekly Topic Adding Flavor - Sodium-Free   Instruction Review Code 1- Verbalizes Understanding   Class Start Time 1400   Class Stop Time 1440   Class Time Calculation (min) 40 min    Row Name 03/24/24 1600     Education   Cardiac Education Topics Pritikin   Geographical information systems officer Exercise   Exercise Workshop Location manager and Fall Prevention   Psychosocial Workshop Healthy Sleep for a Healthy Heart   Instruction Review Code 1- Verbalizes Understanding   Class Start Time 1407   Class Stop Time 1447   Class Time Calculation (min) 40 min    Row Name 03/29/24 1500     Education   Cardiac Education Topics Pritikin   Nurse, children's Exercise Physiologist   Select Nutrition   Nutrition Other  Label reading   Instruction Review Code 1- Verbalizes Understanding   Class Start Time 1350   Class Stop Time 1435   Class Time Calculation (min) 45 min    Row Name 03/31/24 1500     Education   Cardiac Education Topics Pritikin   Customer service manager   Weekly Topic Fast and Healthy Breakfasts   Instruction Review Code 1-  Verbalizes Understanding   Class Start Time 1400   Class Stop Time 1440   Class Time Calculation (min) 40 min    Row Name 04/03/24 1500     Education   Cardiac Education Topics Pritikin   Hospital doctor Education   General  Education Metabolic Syndrome and Belly Fat   Instruction Review Code 1- Verbalizes Understanding   Class Start Time 1355   Class Stop Time 1440   Class Time Calculation (min) 45 min            Core Videos: Exercise    Move It!  Clinical staff conducted group or individual video education with verbal and written material and guidebook.  Patient learns the recommended Pritikin exercise program. Exercise with the goal of living a long, healthy life. Some of the health benefits of exercise include controlled diabetes, healthier blood pressure levels, improved cholesterol levels, improved heart and lung capacity, improved sleep, and better body composition. Everyone should speak with their doctor before starting or changing an exercise routine.  Biomechanical Limitations Clinical staff conducted group or individual video education with verbal and written material and guidebook.  Patient learns how biomechanical limitations can impact exercise and how we can mitigate and possibly overcome limitations to have an impactful and balanced exercise routine.  Body Composition Clinical staff conducted group or individual video education with verbal and written material and guidebook.  Patient learns that body composition (ratio of muscle mass to fat mass) is a key component to assessing overall fitness, rather than body weight alone. Increased fat mass, especially visceral belly fat, can put us  at increased risk for metabolic syndrome, type 2 diabetes, heart disease, and even death. It is recommended to combine diet and exercise (cardiovascular and resistance training) to improve your body composition. Seek guidance from your physician and exercise physiologist before implementing an exercise routine.  Exercise Action Plan Clinical staff conducted group or individual video education with verbal and written material and guidebook.  Patient learns the recommended strategies to achieve and enjoy long-term  exercise adherence, including variety, self-motivation, self-efficacy, and positive decision making. Benefits of exercise include fitness, good health, weight management, more energy, better sleep, less stress, and overall well-being.  Medical   Heart Disease Risk Reduction Clinical staff conducted group or individual video education with verbal and written material and guidebook.  Patient learns our heart is our most vital organ as it circulates oxygen, nutrients, white blood cells, and hormones throughout the entire body, and carries waste away. Data supports a plant-based eating plan like the Pritikin Program for its effectiveness in slowing progression of and reversing heart disease. The video provides a number of recommendations to address heart disease.   Metabolic Syndrome and Belly Fat  Clinical staff conducted group or individual video education with verbal and written material and guidebook.  Patient learns what metabolic syndrome is, how it leads to heart disease, and how one can reverse it and keep it from coming back. You have metabolic syndrome if you have 3 of the following 5 criteria: abdominal obesity, high blood pressure, high triglycerides, low HDL cholesterol, and high blood sugar.  Hypertension and Heart Disease Clinical staff conducted group or individual video education with verbal and written material and guidebook.  Patient learns that high blood pressure, or hypertension, is very common in the [place] . Hypertension is largely due to excessive salt intake, but other important risk  factors include being overweight, physical inactivity, drinking too much alcohol, smoking, and not eating enough potassium from fruits and vegetables. High blood pressure is a leading risk factor for heart attack, stroke, congestive heart failure, dementia, kidney failure, and premature death. Long-term effects of excessive salt intake include stiffening of the arteries and thickening of heart  muscle and organ damage. Recommendations include ways to reduce hypertension and the risk of heart disease.  Diseases of Our Time - Focusing on Diabetes Clinical staff conducted group or individual video education with verbal and written material and guidebook.  Patient learns why the best way to stop diseases of our time is prevention, through food and other lifestyle changes. Medicine (such as prescription pills and surgeries) is often only a Band-Aid on the problem, not a long-term solution. Most common diseases of our time include obesity, type 2 diabetes, hypertension, heart disease, and cancer. The Pritikin Program is recommended and has been proven to help reduce, reverse, and/or prevent the damaging effects of metabolic syndrome.  Nutrition   Overview of the Pritikin Eating Plan  Clinical staff conducted group or individual video education with verbal and written material and guidebook.  Patient learns about the Pritikin Eating Plan for disease risk reduction. The Pritikin Eating Plan emphasizes a wide variety of unrefined, minimally-processed carbohydrates, like fruits, vegetables, whole grains, and legumes. Go, Caution, and Stop food choices are explained. Plant-based and lean animal proteins are emphasized. Rationale provided for low sodium intake for blood pressure control, low added sugars for blood sugar stabilization, and low added fats and oils for coronary artery disease risk reduction and weight management.  Calorie Density  Clinical staff conducted group or individual video education with verbal and written material and guidebook.  Patient learns about calorie density and how it impacts the Pritikin Eating Plan. Knowing the characteristics of the food you choose will help you decide whether those foods will lead to weight gain or weight loss, and whether you want to consume more or less of them. Weight loss is usually a side effect of the Pritikin Eating Plan because of its focus on  low calorie-dense foods.  Label Reading  Clinical staff conducted group or individual video education with verbal and written material and guidebook.  Patient learns about the Pritikin recommended label reading guidelines and corresponding recommendations regarding calorie density, added sugars, sodium content, and whole grains.  Dining Out - Part 1  Clinical staff conducted group or individual video education with verbal and written material and guidebook.  Patient learns that restaurant meals can be sabotaging because they can be so high in calories, fat, sodium, and/or sugar. Patient learns recommended strategies on how to positively address this and avoid unhealthy pitfalls.  Facts on Fats  Clinical staff conducted group or individual video education with verbal and written material and guidebook.  Patient learns that lifestyle modifications can be just as effective, if not more so, as many medications for lowering your risk of heart disease. A Pritikin lifestyle can help to reduce your risk of inflammation and atherosclerosis (cholesterol build-up, or plaque, in the artery walls). Lifestyle interventions such as dietary choices and physical activity address the cause of atherosclerosis. A review of the types of fats and their impact on blood cholesterol levels, along with dietary recommendations to reduce fat intake is also included.  Nutrition Action Plan  Clinical staff conducted group or individual video education with verbal and written material and guidebook.  Patient learns how to incorporate Pritikin recommendations into their  lifestyle. Recommendations include planning and keeping personal health goals in mind as an important part of their success.  Healthy Mind-Set    Healthy Minds, Bodies, Hearts  Clinical staff conducted group or individual video education with verbal and written material and guidebook.  Patient learns how to identify when they are stressed. Video will discuss  the impact of that stress, as well as the many benefits of stress management. Patient will also be introduced to stress management techniques. The way we think, act, and feel has an impact on our hearts.  How Our Thoughts Can Heal Our Hearts  Clinical staff conducted group or individual video education with verbal and written material and guidebook.  Patient learns that negative thoughts can cause depression and anxiety. This can result in negative lifestyle behavior and serious health problems. Cognitive behavioral therapy is an effective method to help control our thoughts in order to change and improve our emotional outlook.  Additional Videos:  Exercise    Improving Performance  Clinical staff conducted group or individual video education with verbal and written material and guidebook.  Patient learns to use a non-linear approach by alternating intensity levels and lengths of time spent exercising to help burn more calories and lose more body fat. Cardiovascular exercise helps improve heart health, metabolism, hormonal balance, blood sugar control, and recovery from fatigue. Resistance training improves strength, endurance, balance, coordination, reaction time, metabolism, and muscle mass. Flexibility exercise improves circulation, posture, and balance. Seek guidance from your physician and exercise physiologist before implementing an exercise routine and learn your capabilities and proper form for all exercise.  Introduction to Yoga  Clinical staff conducted group or individual video education with verbal and written material and guidebook.  Patient learns about yoga, a discipline of the coming together of mind, breath, and body. The benefits of yoga include improved flexibility, improved range of motion, better posture and core strength, increased lung function, weight loss, and positive self-image. Yoga's heart health benefits include lowered blood pressure, healthier heart rate, decreased  cholesterol and triglyceride levels, improved immune function, and reduced stress. Seek guidance from your physician and exercise physiologist before implementing an exercise routine and learn your capabilities and proper form for all exercise.  Medical   Aging: Enhancing Your Quality of Life  Clinical staff conducted group or individual video education with verbal and written material and guidebook.  Patient learns key strategies and recommendations to stay in good physical health and enhance quality of life, such as prevention strategies, having an advocate, securing a Health Care Proxy and Power of Attorney, and keeping a list of medications and system for tracking them. It also discusses how to avoid risk for bone loss.  Biology of Weight Control  Clinical staff conducted group or individual video education with verbal and written material and guidebook.  Patient learns that weight gain occurs because we consume more calories than we burn (eating more, moving less). Even if your body weight is normal, you may have higher ratios of fat compared to muscle mass. Too much body fat puts you at increased risk for cardiovascular disease, heart attack, stroke, type 2 diabetes, and obesity-related cancers. In addition to exercise, following the Pritikin Eating Plan can help reduce your risk.  Decoding Lab Results  Clinical staff conducted group or individual video education with verbal and written material and guidebook.  Patient learns that lab test reflects one measurement whose values change over time and are influenced by many factors, including medication, stress, sleep, exercise, food,  hydration, pre-existing medical conditions, and more. It is recommended to use the knowledge from this video to become more involved with your lab results and evaluate your numbers to speak with your doctor.   Diseases of Our Time - Overview  Clinical staff conducted group or individual video education with verbal  and written material and guidebook.  Patient learns that according to the CDC, 50% to 70% of chronic diseases (such as obesity, type 2 diabetes, elevated lipids, hypertension, and heart disease) are avoidable through lifestyle improvements including healthier food choices, listening to satiety cues, and increased physical activity.  Sleep Disorders Clinical staff conducted group or individual video education with verbal and written material and guidebook.  Patient learns how good quality and duration of sleep are important to overall health and well-being. Patient also learns about sleep disorders and how they impact health along with recommendations to address them, including discussing with a physician.  Nutrition  Dining Out - Part 2 Clinical staff conducted group or individual video education with verbal and written material and guidebook.  Patient learns how to plan ahead and communicate in order to maximize their dining experience in a healthy and nutritious manner. Included are recommended food choices based on the type of restaurant the patient is visiting.   Fueling a Banker conducted group or individual video education with verbal and written material and guidebook.  There is a strong connection between our food choices and our health. Diseases like obesity and type 2 diabetes are very prevalent and are in large-part due to lifestyle choices. The Pritikin Eating Plan provides plenty of food and hunger-curbing satisfaction. It is easy to follow, affordable, and helps reduce health risks.  Menu Workshop  Clinical staff conducted group or individual video education with verbal and written material and guidebook.  Patient learns that restaurant meals can sabotage health goals because they are often packed with calories, fat, sodium, and sugar. Recommendations include strategies to plan ahead and to communicate with the manager, chef, or server to help order a healthier  meal.  Planning Your Eating Strategy  Clinical staff conducted group or individual video education with verbal and written material and guidebook.  Patient learns about the Pritikin Eating Plan and its benefit of reducing the risk of disease. The Pritikin Eating Plan does not focus on calories. Instead, it emphasizes high-quality, nutrient-rich foods. By knowing the characteristics of the foods, we choose, we can determine their calorie density and make informed decisions.  Targeting Your Nutrition Priorities  Clinical staff conducted group or individual video education with verbal and written material and guidebook.  Patient learns that lifestyle habits have a tremendous impact on disease risk and progression. This video provides eating and physical activity recommendations based on your personal health goals, such as reducing LDL cholesterol, losing weight, preventing or controlling type 2 diabetes, and reducing high blood pressure.  Vitamins and Minerals  Clinical staff conducted group or individual video education with verbal and written material and guidebook.  Patient learns different ways to obtain key vitamins and minerals, including through a recommended healthy diet. It is important to discuss all supplements you take with your doctor.   Healthy Mind-Set    Smoking Cessation  Clinical staff conducted group or individual video education with verbal and written material and guidebook.  Patient learns that cigarette smoking and tobacco addiction pose a serious health risk which affects millions of people. Stopping smoking will significantly reduce the risk of heart disease, lung disease,  and many forms of cancer. Recommended strategies for quitting are covered, including working with your doctor to develop a successful plan.  Culinary   Becoming a Set designer conducted group or individual video education with verbal and written material and guidebook.  Patient learns  that cooking at home can be healthy, cost-effective, quick, and puts them in control. Keys to cooking healthy recipes will include looking at your recipe, assessing your equipment needs, planning ahead, making it simple, choosing cost-effective seasonal ingredients, and limiting the use of added fats, salts, and sugars.  Cooking - Breakfast and Snacks  Clinical staff conducted group or individual video education with verbal and written material and guidebook.  Patient learns how important breakfast is to satiety and nutrition through the entire day. Recommendations include key foods to eat during breakfast to help stabilize blood sugar levels and to prevent overeating at meals later in the day. Planning ahead is also a key component.  Cooking - Educational psychologist conducted group or individual video education with verbal and written material and guidebook.  Patient learns eating strategies to improve overall health, including an approach to cook more at home. Recommendations include thinking of animal protein as a side on your plate rather than center stage and focusing instead on lower calorie dense options like vegetables, fruits, whole grains, and plant-based proteins, such as beans. Making sauces in large quantities to freeze for later and leaving the skin on your vegetables are also recommended to maximize your experience.  Cooking - Healthy Salads and Dressing Clinical staff conducted group or individual video education with verbal and written material and guidebook.  Patient learns that vegetables, fruits, whole grains, and legumes are the foundations of the Pritikin Eating Plan. Recommendations include how to incorporate each of these in flavorful and healthy salads, and how to create homemade salad dressings. Proper handling of ingredients is also covered. Cooking - Soups and [employer] - Soups and Desserts Clinical staff conducted group or individual video education with  verbal and written material and guidebook.  Patient learns that Pritikin soups and desserts make for easy, nutritious, and delicious snacks and meal components that are low in sodium, fat, sugar, and calorie density, while high in vitamins, minerals, and filling fiber. Recommendations include simple and healthy ideas for soups and desserts.   Overview     The Pritikin Solution Program Overview Clinical staff conducted group or individual video education with verbal and written material and guidebook.  Patient learns that the results of the Pritikin Program have been documented in more than 100 articles published in peer-reviewed journals, and the benefits include reducing risk factors for (and, in some cases, even reversing) high cholesterol, high blood pressure, type 2 diabetes, obesity, and more! An overview of the three key pillars of the Pritikin Program will be covered: eating well, doing regular exercise, and having a healthy mind-set.  WORKSHOPS  Exercise: Exercise Basics: Building Your Action Plan Clinical staff led group instruction and group discussion with PowerPoint presentation and patient guidebook. To enhance the learning environment the use of posters, models and videos may be added. At the conclusion of this workshop, patients will comprehend the difference between physical activity and exercise, as well as the benefits of incorporating both, into their routine. Patients will understand the FITT (Frequency, Intensity, Time, and Type) principle and how to use it to build an exercise action plan. In addition, safety concerns and other considerations for exercise and cardiac rehab  will be addressed by the presenter. The purpose of this lesson is to promote a comprehensive and effective weekly exercise routine in order to improve patients' overall level of fitness.   Managing Heart Disease: Your Path to a Healthier Heart Clinical staff led group instruction and group discussion with  PowerPoint presentation and patient guidebook. To enhance the learning environment the use of posters, models and videos may be added.At the conclusion of this workshop, patients will understand the anatomy and physiology of the heart. Additionally, they will understand how Pritikin's three pillars impact the risk factors, the progression, and the management of heart disease.  The purpose of this lesson is to provide a high-level overview of the heart, heart disease, and how the Pritikin lifestyle positively impacts risk factors.  Exercise Biomechanics Clinical staff led group instruction and group discussion with PowerPoint presentation and patient guidebook. To enhance the learning environment the use of posters, models and videos may be added. Patients will learn how the structural parts of their bodies function and how these functions impact their daily activities, movement, and exercise. Patients will learn how to promote a neutral spine, learn how to manage pain, and identify ways to improve their physical movement in order to promote healthy living. The purpose of this lesson is to expose patients to common physical limitations that impact physical activity. Participants will learn practical ways to adapt and manage aches and pains, and to minimize their effect on regular exercise. Patients will learn how to maintain good posture while sitting, walking, and lifting.  Balance Training and Fall Prevention  Clinical staff led group instruction and group discussion with PowerPoint presentation and patient guidebook. To enhance the learning environment the use of posters, models and videos may be added. At the conclusion of this workshop, patients will understand the importance of their sensorimotor skills (vision, proprioception, and the vestibular system) in maintaining their ability to balance as they age. Patients will apply a variety of balancing exercises that are appropriate for their  current level of function. Patients will understand the common causes for poor balance, possible solutions to these problems, and ways to modify their physical environment in order to minimize their fall risk. The purpose of this lesson is to teach patients about the importance of maintaining balance as they age and ways to minimize their risk of falling.  WORKSHOPS   Nutrition:  Fueling a Ship broker led group instruction and group discussion with PowerPoint presentation and patient guidebook. To enhance the learning environment the use of posters, models and videos may be added. Patients will review the foundational principles of the Pritikin Eating Plan and understand what constitutes a serving size in each of the food groups. Patients will also learn Pritikin-friendly foods that are better choices when away from home and review make-ahead meal and snack options. Calorie density will be reviewed and applied to three nutrition priorities: weight maintenance, weight loss, and weight gain. The purpose of this lesson is to reinforce (in a group setting) the key concepts around what patients are recommended to eat and how to apply these guidelines when away from home by planning and selecting Pritikin-friendly options. Patients will understand how calorie density may be adjusted for different weight management goals.  Mindful Eating  Clinical staff led group instruction and group discussion with PowerPoint presentation and patient guidebook. To enhance the learning environment the use of posters, models and videos may be added. Patients will briefly review the concepts of the Pritikin Eating Plan  and the importance of low-calorie dense foods. The concept of mindful eating will be introduced as well as the importance of paying attention to internal hunger signals. Triggers for non-hunger eating and techniques for dealing with triggers will be explored. The purpose of this lesson is to provide  patients with the opportunity to review the basic principles of the Pritikin Eating Plan, discuss the value of eating mindfully and how to measure internal cues of hunger and fullness using the Hunger Scale. Patients will also discuss reasons for non-hunger eating and learn strategies to use for controlling emotional eating.  Targeting Your Nutrition Priorities Clinical staff led group instruction and group discussion with PowerPoint presentation and patient guidebook. To enhance the learning environment the use of posters, models and videos may be added. Patients will learn how to determine their genetic susceptibility to disease by reviewing their family history. Patients will gain insight into the importance of diet as part of an overall healthy lifestyle in mitigating the impact of genetics and other environmental insults. The purpose of this lesson is to provide patients with the opportunity to assess their personal nutrition priorities by looking at their family history, their own health history and current risk factors. Patients will also be able to discuss ways of prioritizing and modifying the Pritikin Eating Plan for their highest risk areas  Menu  Clinical staff led group instruction and group discussion with PowerPoint presentation and patient guidebook. To enhance the learning environment the use of posters, models and videos may be added. Using menus brought in from [employer], or printed from [employer], patients will apply the Pritikin dining out guidelines that were presented in the [employer] video. Patients will also be able to practice these guidelines in a variety of provided scenarios. The purpose of this lesson is to provide patients with the opportunity to practice hands-on learning of the Pritikin Dining Out guidelines with actual menus and practice scenarios.  Label Reading Clinical staff led group instruction and group discussion with PowerPoint  presentation and patient guidebook. To enhance the learning environment the use of posters, models and videos may be added. Patients will review and discuss the Pritikin label reading guidelines presented in Pritikin's Label Reading Educational series video. Using fool labels brought in from local grocery stores and markets, patients will apply the label reading guidelines and determine if the packaged food meet the Pritikin guidelines. The purpose of this lesson is to provide patients with the opportunity to review, discuss, and practice hands-on learning of the Pritikin Label Reading guidelines with actual packaged food labels. Cooking School  Pritikin's [employer] are designed to teach patients ways to prepare quick, simple, and affordable recipes at home. The importance of nutrition's role in chronic disease risk reduction is reflected in its emphasis in the overall Pritikin program. By learning how to prepare essential core Pritikin Eating Plan recipes, patients will increase control over what they eat; be able to customize the flavor of foods without the use of added salt, sugar, or fat; and improve the quality of the food they consume. By learning a set of core recipes which are easily assembled, quickly prepared, and affordable, patients are more likely to prepare more healthy foods at home. These workshops focus on convenient breakfasts, simple entres, side dishes, and desserts which can be prepared with minimal effort and are consistent with nutrition recommendations for cardiovascular risk reduction. Cooking [employer] are taught by a Armed forces logistics/support/administrative officer (RD) who has been  trained by the [employer] team. The chef or RD has a clear understanding of the importance of minimizing - if not completely eliminating - added fat, sugar, and sodium in recipes. Throughout the series of Cooking School Workshop sessions, patients will learn about healthy ingredients and  efficient methods of cooking to build confidence in their capability to prepare    Cooking School weekly topics:  Adding Flavor- Sodium-Free  Fast and Healthy Breakfasts  Powerhouse Plant-Based Proteins  Satisfying Salads and Dressings  Simple Sides and Sauces  International Cuisine-Spotlight on the [employer] Zones  Delicious Desserts  Savory Soups  [employer] - Meals in a Astronomer Appetizers and Snacks  Comforting Weekend Breakfasts  One-Pot Wonders   Fast Evening Meals  Landscape architect Your Pritikin Plate  WORKSHOPS   Healthy Mindset (Psychosocial):  Focused Goals, Sustainable Changes Clinical staff led group instruction and group discussion with PowerPoint presentation and patient guidebook. To enhance the learning environment the use of posters, models and videos may be added. Patients will be able to apply effective goal setting strategies to establish at least one personal goal, and then take consistent, meaningful action toward that goal. They will learn to identify common barriers to achieving personal goals and develop strategies to overcome them. Patients will also gain an understanding of how our mind-set can impact our ability to achieve goals and the importance of cultivating a positive and growth-oriented mind-set. The purpose of this lesson is to provide patients with a deeper understanding of how to set and achieve personal goals, as well as the tools and strategies needed to overcome common obstacles which may arise along the way.  From Head to Heart: The Power of a Healthy Outlook  Clinical staff led group instruction and group discussion with PowerPoint presentation and patient guidebook. To enhance the learning environment the use of posters, models and videos may be added. Patients will be able to recognize and describe the impact of emotions and mood on physical health. They will discover the importance of self-care and explore self-care  practices which may work for them. Patients will also learn how to utilize the 4 C's to cultivate a healthier outlook and better manage stress and challenges. The purpose of this lesson is to demonstrate to patients how a healthy outlook is an essential part of maintaining good health, especially as they continue their cardiac rehab journey.  Healthy Sleep for a Healthy Heart Clinical staff led group instruction and group discussion with PowerPoint presentation and patient guidebook. To enhance the learning environment the use of posters, models and videos may be added. At the conclusion of this workshop, patients will be able to demonstrate knowledge of the importance of sleep to overall health, well-being, and quality of life. They will understand the symptoms of, and treatments for, common sleep disorders. Patients will also be able to identify daytime and nighttime behaviors which impact sleep, and they will be able to apply these tools to help manage sleep-related challenges. The purpose of this lesson is to provide patients with a general overview of sleep and outline the importance of quality sleep. Patients will learn about a few of the most common sleep disorders. Patients will also be introduced to the concept of "sleep hygiene," and discover ways to self-manage certain sleeping problems through simple daily behavior changes. Finally, the workshop will motivate patients by clarifying the links between quality sleep and their goals of heart-healthy living.   Recognizing and Reducing Stress Clinical staff  led group instruction and group discussion with PowerPoint presentation and patient guidebook. To enhance the learning environment the use of posters, models and videos may be added. At the conclusion of this workshop, patients will be able to understand the types of stress reactions, differentiate between acute and chronic stress, and recognize the impact that chronic stress has on their health. They  will also be able to apply different coping mechanisms, such as reframing negative self-talk. Patients will have the opportunity to practice a variety of stress management techniques, such as deep abdominal breathing, progressive muscle relaxation, and/or guided imagery.  The purpose of this lesson is to educate patients on the role of stress in their lives and to provide healthy techniques for coping with it.  Learning Barriers/Preferences:  Learning Barriers/Preferences - 02/01/24 1206       Learning Barriers/Preferences   Learning Barriers Sight   wears reading glasses   Learning Preferences Audio;Verbal Instruction;Computer/Internet;Video;Written Material;Group Instruction;Individual Instruction;Pictoral;Skilled Demonstration             Education Topics:  Knowledge Questionnaire Score:  Knowledge Questionnaire Score - 02/01/24 1329       Knowledge Questionnaire Score   Pre Score 24/24             Core Components/Risk Factors/Patient Goals at Admission:  Personal Goals and Risk Factors at Admission - 02/01/24 1206       Core Components/Risk Factors/Patient Goals on Admission    Weight Management Yes;Obesity;Weight Loss    Intervention Weight Management: Develop a combined nutrition and exercise program designed to reach desired caloric intake, while maintaining appropriate intake of nutrient and fiber, sodium and fats, and appropriate energy expenditure required for the weight goal.;Weight Management: Provide education and appropriate resources to help participant work on and attain dietary goals.;Weight Management/Obesity: Establish reasonable short term and long term weight goals.    Admit Weight 187 lb 14.4 oz (85.2 kg)    Goal Weight: Long Term 176 lb (79.8 kg)    Expected Outcomes Short Term: Continue to assess and modify interventions until short term weight is achieved;Long Term: Adherence to nutrition and physical activity/exercise program aimed toward attainment  of established weight goal;Weight Loss: Understanding of general recommendations for a balanced deficit meal plan, which promotes 1-2 lb weight loss per week and includes a negative energy balance of [phone] kcal/d;Understanding recommendations for meals to include 15-35% energy as protein, 25-35% energy from fat, 35-60% energy from carbohydrates, less than 200mg  of dietary cholesterol, 20-35 gm of total fiber daily;Understanding of distribution of calorie intake throughout the day with the consumption of 4-5 meals/snacks    Hypertension Yes    Intervention Provide education on lifestyle modifcations including regular physical activity/exercise, weight management, moderate sodium restriction and increased consumption of fresh fruit, vegetables, and low fat dairy, alcohol moderation, and smoking cessation.;Monitor prescription use compliance.    Expected Outcomes Short Term: Continued assessment and intervention until BP is < 140/31mm HG in hypertensive participants. < 130/63mm HG in hypertensive participants with diabetes, heart failure or chronic kidney disease.;Long Term: Maintenance of blood pressure at goal levels.    Lipids Yes    Intervention Provide education and support for participant on nutrition & aerobic/resistive exercise along with prescribed medications to achieve LDL 70mg , HDL >40mg .    Expected Outcomes Short Term: Participant states understanding of desired cholesterol values and is compliant with medications prescribed. Participant is following exercise prescription and nutrition guidelines.;Long Term: Cholesterol controlled with medications as prescribed, with individualized exercise RX and with personalized  nutrition plan. Value goals: LDL < 70mg , HDL > 40 mg.             Core Components/Risk Factors/Patient Goals Review:   Goals and Risk Factor Review     Row Name 02/07/24 1641 02/11/24 0911 03/08/24 1303 04/05/24 0815       Core Components/Risk Factors/Patient Goals  Review   Personal Goals Review Weight Management/Obesity;Hypertension;Lipids Weight Management/Obesity;Hypertension;Lipids Weight Management/Obesity;Hypertension;Lipids Weight Management/Obesity;Hypertension;Lipids    Review Shelly [name] started cardiac rehab on 02/07/24. Shelly [name] did well with exercise. vital signs and CBg's were stable. Shelly [name] started cardiac rehab on 02/07/24. Shelly [name] is off to a good start  with exercise. vital signs and CBg's were stable. Shelly [name] is not taking metformin  at this time, Spot checked CBG Shelly [name] is doing well with exercise at cardiac rehab. vital signs have been  stable. Shelly [name] continues to do  well with exercise at cardiac rehab. vital signs have been  stable. Shelly [name] and another participant went ot visit [facility]. Shelly [name] is considering continuing her exercise there when she completes cardiac rehab    Expected Outcomes Shelly [name] will continue to participate in cardiac rehab for exercise, nutrition and lifestyle modifications Shelly [name] will continue to participate in cardiac rehab for exercise, nutrition and lifestyle modifications Shelly [name] will continue to participate in cardiac rehab for exercise, nutrition and lifestyle modifications Shelly [name] will continue to participate in cardiac rehab for exercise, nutrition and lifestyle modifications             Core Components/Risk Factors/Patient Goals at Discharge (Final Review):   Goals and Risk Factor Review - 04/05/24 0815       Core Components/Risk Factors/Patient Goals Review   Personal Goals Review Weight Management/Obesity;Hypertension;Lipids    Review Shelly [name] continues to do  well with exercise at cardiac rehab. vital signs have been  stable. Shelly [name] and another participant went ot visit [facility]. Shelly [name] is considering continuing her exercise there when she completes cardiac rehab    Expected Outcomes Shelly [name] will continue to participate in cardiac rehab for exercise, nutrition and lifestyle modifications             ITP Comments:  ITP Comments     Row Name 02/01/24  1012 02/07/24 1638 02/11/24 0909 03/08/24 1258 04/05/24 0812   ITP Comments Dr. Gaylyn Keas medical director. Introduction to pritikin education/intensive cardiac rehab. Initial orientation packet reviewed with patient. 30 Day ITP Review. Shelly [name] started cardiac rehab on 02/07/24. Shelly [name] did well with exercise. 30 Day ITP Review. Shelly [name] started cardiac rehab on 02/07/24. Shelly [name] is off to a good start  with exercise. 30 Day ITP Review. Shelly [name] has good attendance and participation with exercise at cardiac rehab 30 Day ITP Review. Shelly [name] continues to have  good attendance and participation with exercise at cardiac rehab            Comments: See ITP comments.Monte Antonio RN BSN

## 2024-04-07 ENCOUNTER — Encounter (HOSPITAL_COMMUNITY)
Admission: RE | Admit: 2024-04-07 | Discharge: 2024-04-07 | Disposition: A | Discharge status: Home / Self Care | Source: Ambulatory Visit | Attending: Cardiovascular Disease | Admitting: Cardiovascular Disease

## 2024-04-07 DIAGNOSIS — Z955 Presence of coronary angioplasty implant and graft: Secondary | ICD-10-CM | POA: Diagnosis not present

## 2024-04-12 ENCOUNTER — Encounter (HOSPITAL_COMMUNITY)

## 2024-04-14 ENCOUNTER — Other Ambulatory Visit: Payer: Self-pay | Admitting: Cardiology

## 2024-04-14 ENCOUNTER — Other Ambulatory Visit (HOSPITAL_COMMUNITY): Payer: Self-pay

## 2024-04-14 ENCOUNTER — Encounter (HOSPITAL_COMMUNITY)

## 2024-04-14 MED ORDER — METOPROLOL SUCCINATE ER 25 MG PO TB24
25.0000 mg | ORAL_TABLET | Freq: Every day | ORAL | 0 refills | Status: DC
  Filled 2024-04-14 – 2024-04-17 (×2): qty 90, 90d supply, fill #0

## 2024-04-14 MED ORDER — ROSUVASTATIN CALCIUM 10 MG PO TABS
10.0000 mg | ORAL_TABLET | Freq: Every day | ORAL | 0 refills | Status: DC
  Filled 2024-04-14 – 2024-04-17 (×2): qty 90, 90d supply, fill #0

## 2024-04-17 ENCOUNTER — Encounter (HOSPITAL_COMMUNITY): Admission: RE | Admit: 2024-04-17 | Source: Ambulatory Visit

## 2024-04-17 ENCOUNTER — Other Ambulatory Visit (HOSPITAL_COMMUNITY): Payer: Self-pay

## 2024-04-17 ENCOUNTER — Telehealth (HOSPITAL_COMMUNITY): Payer: Self-pay

## 2024-04-17 NOTE — Telephone Encounter (Signed)
 Patient c/o for 1:45pm class today, no reason given.

## 2024-04-19 ENCOUNTER — Encounter (HOSPITAL_COMMUNITY)
Admission: RE | Admit: 2024-04-19 | Discharge: 2024-04-19 | Disposition: A | Discharge status: Home / Self Care | Source: Ambulatory Visit | Attending: Cardiovascular Disease | Admitting: Cardiovascular Disease

## 2024-04-19 ENCOUNTER — Ambulatory Visit: Admitting: Family Medicine

## 2024-04-19 DIAGNOSIS — Z955 Presence of coronary angioplasty implant and graft: Secondary | ICD-10-CM | POA: Diagnosis not present

## 2024-04-21 ENCOUNTER — Encounter (HOSPITAL_COMMUNITY)
Admission: RE | Admit: 2024-04-21 | Discharge: 2024-04-21 | Disposition: A | Discharge status: Home / Self Care | Source: Ambulatory Visit | Attending: Cardiovascular Disease | Admitting: Cardiovascular Disease

## 2024-04-21 DIAGNOSIS — Z955 Presence of coronary angioplasty implant and graft: Secondary | ICD-10-CM

## 2024-04-24 ENCOUNTER — Encounter: Payer: Self-pay | Admitting: Family Medicine

## 2024-04-24 ENCOUNTER — Ambulatory Visit (INDEPENDENT_AMBULATORY_CARE_PROVIDER_SITE_OTHER): Admitting: Family Medicine

## 2024-04-24 VITALS — BP 136/62 | HR 64 | Temp 97.6°F | Wt 183.2 lb

## 2024-04-24 DIAGNOSIS — E118 Type 2 diabetes mellitus with unspecified complications: Secondary | ICD-10-CM

## 2024-04-24 DIAGNOSIS — I10 Essential (primary) hypertension: Secondary | ICD-10-CM | POA: Diagnosis not present

## 2024-04-24 DIAGNOSIS — E782 Mixed hyperlipidemia: Secondary | ICD-10-CM | POA: Diagnosis not present

## 2024-04-24 LAB — POCT GLYCOSYLATED HEMOGLOBIN (HGB A1C): Hemoglobin A1C: 6.4 % — AB (ref 4.0–5.6)

## 2024-04-24 NOTE — Progress Notes (Signed)
 Established Patient Office Visit  Subjective   Patient ID: Shelly [name], female    DOB: [birth year]  Age: 78 y.o. MRN: [id number]  Chief Complaint  Patient presents with   Medical Management of Chronic Issues    HPI   [name] seen for medical follow-up.  Her recent history is that she developed some unstable angina back in the spring and had non-ST elevation MI with tandem RCA lesions that were stented.  She is just finishing up cardiac rehab program now and feels that that has been beneficial.  She also has set up membership with [employer] to hopefully continue her exercise habits.  She also has treadmill and exercise bike at home.  She and her husband have made some positive dietary changes.  She is trying to watch sodium intake more closely.  Eating lower glycemic foods.  Less beef and much more fish.  Recent cholesterol 133 with LDL 65.  This was prior to her going on Praluent .  Also remains on rosuvastatin .  She is taking low-dose Toprol -XL but no other blood pressure medications.  She has type 2 diabetes but did not tolerate metformin  secondary to abdominal cramps.  She would prefer to try to manage this with lifestyle changes.  Blood pressures at home have been extremely well-controlled mostly 115 or so systolic and 70 diastolic.  Past Medical History:  Diagnosis Date   Allergy    Arthritis    wrist, shoulder    Cancer (HCC) 08/2019   left breast IDC   Diverticulitis    Endometrial polyp    Family history of breast cancer    GERD (gastroesophageal reflux disease)    occasional - diet controlled   Hx of adenomatous colonic polyps    Hyperlipidemia    IBS (irritable bowel syndrome)    Missed abortion    x 1 - resolved - no surgery   Osteoporosis    SVD (spontaneous vaginal delivery)    x 1   Transient vision disturbance 2019   was evaluated by ED, no findings   Vitamin D  deficiency    Past Surgical History:  Procedure Laterality Date   BREAST EXCISIONAL  BIOPSY Right 08/2019   Benign, lesion    BREAST LUMPECTOMY Left 09/2019   BREAST LUMPECTOMY WITH RADIOACTIVE SEED AND SENTINEL LYMPH NODE BIOPSY Left 10/06/2019   Procedure: LEFT BREAST LUMPECTOMY  X2 WITH RADIOACTIVE SEED X2  AND SENTINEL LYMPH NODE BIOPSY;  Surgeon: Caralyn Chandler, MD;  Location: [facility];  Service: General;  Laterality: Left;   BREAST LUMPECTOMY WITH RADIOACTIVE SEED LOCALIZATION Right 10/06/2019   Procedure: RIGHT BREAST LUMPECTOMY WITH RADIOACTIVE SEED LOCALIZATION;  Surgeon: Caralyn Chandler, MD;  Location: [facility];  Service: General;  Laterality: Right;   BRONCHIAL BIOPSY  12/27/2020   Procedure: BRONCHIAL BIOPSIES;  Surgeon: Prudy Brownie, DO;  Location: [facility];  Service: Pulmonary;;   BRONCHIAL BRUSHINGS  12/27/2020   Procedure: BRONCHIAL BRUSHINGS;  Surgeon: Prudy Brownie, DO;  Location: [facility];  Service: Pulmonary;;   BRONCHIAL NEEDLE ASPIRATION BIOPSY  12/27/2020   Procedure: BRONCHIAL NEEDLE ASPIRATION BIOPSIES;  Surgeon: Prudy Brownie, DO;  Location: [facility];  Service: Pulmonary;;   BRONCHIAL WASHINGS  12/27/2020   Procedure: BRONCHIAL WASHINGS;  Surgeon: Prudy Brownie, DO;  Location: [facility];  Service: Pulmonary;;   COLONOSCOPY     CORONARY ANGIOGRAPHY N/A 01/18/2024   Procedure: CORONARY ANGIOGRAPHY;  Surgeon: Kyra Phy, MD;  Location: [facility];  Service: Cardiovascular;  Laterality: N/A;   CORONARY IMAGING/OCT N/A 01/18/2024   Procedure: CORONARY IMAGING/OCT;  Surgeon: Kyra Phy, MD;  Location: [facility];  Service: Cardiovascular;  Laterality: N/A;   CORONARY STENT INTERVENTION N/A 01/18/2024   Procedure: CORONARY STENT INTERVENTION;  Surgeon: Kyra Phy, MD;  Location: [facility];  Service: Cardiovascular;  Laterality: N/A;   DIAGNOSTIC LAPAROSCOPY     cysts   DILATATION & CURETTAGE/HYSTEROSCOPY WITH TRUECLEAR N/A 10/31/2013   Procedure: DILATATION &  CURETTAGE/HYSTEROSCOPY WITH TRUCLEAR;  Surgeon: Ashby Lawman, MD;  Location: [facility];  Service: Gynecology;  Laterality: N/A;   DILATION AND CURETTAGE OF UTERUS     MASS EXCISION Left 11/06/2019   Procedure: EXCISION NIPPLE AND AREOLA  MARGIN LEFT BREAST;  Surgeon: Caralyn Chandler, MD;  Location: [facility];  Service: General;  Laterality: Left;   TONSILLECTOMY  1952   VIDEO BRONCHOSCOPY WITH ENDOBRONCHIAL NAVIGATION Bilateral 12/27/2020   Procedure: VIDEO BRONCHOSCOPY WITH ENDOBRONCHIAL NAVIGATION;  Surgeon: Prudy Brownie, DO;  Location: [facility];  Service: Pulmonary;  Laterality: Bilateral;   VIDEO BRONCHOSCOPY WITH ENDOBRONCHIAL ULTRASOUND Bilateral 12/27/2020   Procedure: VIDEO BRONCHOSCOPY WITH ENDOBRONCHIAL ULTRASOUND;  Surgeon: Prudy Brownie, DO;  Location: [facility];  Service: Pulmonary;  Laterality: Bilateral;   WISDOM TOOTH EXTRACTION      reports that she quit smoking about 16 years ago. Her smoking use included cigarettes. She started smoking about 36 years ago. She has a 2 pack-year smoking history. She has never used smokeless tobacco. She reports current alcohol use of about 14.0 standard drinks of alcohol per week. She reports that she does not use drugs. family history includes Brain cancer in her paternal uncle; Breast cancer (age of onset: 52) in her cousin; Breast cancer (age of onset: 54) in her maternal aunt and maternal aunt; Cancer in her maternal grandfather, maternal uncle, and another family member; Colon polyps in her mother; Depression in her brother; Diabetes in her mother; Heart disease in an other family member; Hyperlipidemia in her father and mother; Hypertension in her mother and another family member; Leukemia (age of onset: 68) in her mother; Lung cancer in her father and maternal grandfather; Throat cancer in her paternal uncle; Uterine cancer in her paternal grandmother. Allergies  Allergen Reactions   Taxotere  [Docetaxel ] Swelling    Abraxane  [Paclitaxel  Protein-Bound Part] Other (See Comments)    3 days after receiving she had one episode of very high BP and HR. This happened again 2 days later during the night.    Amlodipine      Caused IBS flare-up. Returned after stopping and restarting med   Crestor  [Rosuvastatin  Calcium ] Itching    GI upset   Lipitor [Atorvastatin  Calcium ] Itching   Lisinopril  Cough   Losartan      Diffuse arthralgias and back pain. Returned after stopping and restarting med   Nsaids Other (See Comments)    GI upset   Pravastatin  Itching    GI upset   Sulfonamide Derivatives Rash    Hives, itiching    Review of Systems  Constitutional:  Negative for malaise/fatigue.  Eyes:  Negative for blurred vision.  Respiratory:  Negative for shortness of breath.   Cardiovascular:  Negative for chest pain.  Neurological:  Negative for dizziness, weakness and headaches.      Objective:      BP 136/62 (BP Location: Left Arm, Cuff Size: Normal)   Pulse 64   Temp 97.6 F (  36.4 C) (Oral)   Wt 183 lb 3.2 oz (83.1 kg)   SpO2 95%   BMI 30.25 kg/m  BP Readings from Last 3 Encounters:  04/24/24 136/62  03/23/24 (!) 150/78  02/15/24 132/74   Wt Readings from Last 3 Encounters:  04/24/24 183 lb 3.2 oz (83.1 kg)  03/23/24 187 lb 4.8 oz (85 kg)  03/21/24 185 lb 3.2 oz (84 kg)      Physical Exam Vitals reviewed.  Constitutional:      General: She is not in acute distress.    Appearance: She is well-developed. She is not ill-appearing.  Eyes:     Pupils: Pupils are equal, round, and reactive to light.  Neck:     Thyroid: No thyromegaly.     Vascular: No JVD.  Cardiovascular:     Rate and Rhythm: Normal rate and regular rhythm.     Heart sounds:     No gallop.  Pulmonary:     Effort: Pulmonary effort is normal. No respiratory distress.     Breath sounds: Normal breath sounds. No wheezing or rales.  Musculoskeletal:     Cervical back: Neck supple.  Neurological:     Mental Status: She  is alert.      Results for orders placed or performed in visit on 04/24/24  POC HgB A1c  Result Value Ref Range   Hemoglobin A1C 6.4 (A) 4.0 - 5.6 %   HbA1c POC (<> result, manual entry)     HbA1c, POC (prediabetic range)     HbA1c, POC (controlled diabetic range)        The 10-year ASCVD risk score (Arnett DK, et al., 2019) is: 54.9%    Assessment & Plan:   #1 type 2 diabetes improved with A1c 6.4%.  She has done a good job with exercise and has made some dietary changes.  We did discuss possible SGLT2 medication such as Jardiance down the road especially if her A1c climbs again.  She prefers less medicine at this point if possible  #2 hypertension.  Probable element of whitecoat syndrome.  Blood pressure well-controlled at home readings.  Improved significantly here today.  Continue low-sodium diet, regular exercise, and weight loss.  She will continue close monitoring at home  #3 dyslipidemia.  Goal LDL less than 55.  Recent addition of Praluent  per cardiology.  She will plan to get follow-up fasting labs in a couple months.  Continue low saturated fat diet   Glean Lamy, MD

## 2024-04-24 NOTE — Patient Instructions (Signed)
 A1C today improved to 6.4%  Keep up the good work  [facility] plan on 3 month follow up.

## 2024-04-26 ENCOUNTER — Encounter (HOSPITAL_COMMUNITY)
Admission: RE | Admit: 2024-04-26 | Discharge: 2024-04-26 | Disposition: A | Discharge status: Home / Self Care | Source: Ambulatory Visit | Attending: Cardiovascular Disease | Admitting: Cardiovascular Disease

## 2024-04-26 VITALS — Ht 65.25 in

## 2024-04-26 DIAGNOSIS — Z955 Presence of coronary angioplasty implant and graft: Secondary | ICD-10-CM

## 2024-04-28 ENCOUNTER — Encounter (HOSPITAL_COMMUNITY)
Admission: RE | Admit: 2024-04-28 | Discharge: 2024-04-28 | Disposition: A | Discharge status: Home / Self Care | Source: Ambulatory Visit | Attending: Cardiovascular Disease

## 2024-04-28 ENCOUNTER — Telehealth: Payer: Self-pay | Admitting: Cardiovascular Disease

## 2024-04-28 DIAGNOSIS — Z955 Presence of coronary angioplasty implant and graft: Secondary | ICD-10-CM | POA: Diagnosis not present

## 2024-04-28 NOTE — Telephone Encounter (Signed)
 Pt c/o medication issue:  1. Name of Medication:   ticagrelor  (BRILINTA ) 90 MG TABS tablet    2. How are you currently taking this medication (dosage and times per day)? As written   3. Are you having a reaction (difficulty breathing--STAT)? No   4. What is your medication issue? Pt called in stating since taking this med, she has been fatigue and shaky. She also states she wakes up with bruises on her. Please advise.

## 2024-04-28 NOTE — Telephone Encounter (Addendum)
 Burnetta Cart, PA-C to Pugh, Lindsey Rhodes D, LPN  Millicent Ally, MD     04/28/24  3:15 PM These are atypical symptoms that I do not suspect are related to her Brilinta .  A few things.  She admitted to poor sleep which very reasonably could be related to her current symptoms.  Brilinta  is the most ideal therapy with her 2 stents.  Brilinta  is highly recommended for at least the first month, preferably up to year but not unreasonable to switch to Plavix but would defer to Dr. Loetta Ringer to make that decision.   I would tease out her symptoms more to see if this is related to something else.  You can offer her a follow-up visit.  Also with known history of breast cancer so that certainly is on the differential.   She reports that she has a lot of bruising- she is concerned. She has not been doing anything to cause bruising. So, just by scratching/itching causes bruising.  She is wondering if she is on maybe too much- maybe needs a lower dose?   Will forward to Dr Loetta Ringer and our pharmacy team

## 2024-04-28 NOTE — Progress Notes (Signed)
 Discharge Progress Report  Patient Details  Name: Shelly [name] MRN: [id number] Date of Birth: 1946-10-18 Referring Provider:   Flowsheet Row INTENSIVE CARDIAC REHAB ORIENT from 02/01/2024 in [facility]  Referring Provider Debby Sor, MD     Number of Visits: 66  Reason for Discharge:  Patient reached a stable level of exercise. Patient independent in their exercise. Patient has met program and personal goals.  Smoking History:  Social History   Tobacco Use  Smoking Status Former   Current packs/day: 0.00   Average packs/day: 0.1 packs/day for 20.0 years (2.0 ttl pk-yrs)   Types: Cigarettes   Start date: 05/15/1987   Quit date: 05/15/2007   Years since quitting: 17.0  Smokeless Tobacco Never    Diagnosis:  01/18/24 Status post coronary artery stent placement RCA  ADL UCSD:   Initial Exercise Prescription:  Initial Exercise Prescription - 02/01/24 1200       Date of Initial Exercise RX and Referring Provider   Date 02/01/24    Referring Provider Debby Sor, MD    Expected Discharge Date 04/26/24      NuStep   Level 1    SPM 75    Minutes 30    METs 2      Prescription Details   Frequency (times per week) 3    Duration Progress to 30 minutes of continuous aerobic without signs/symptoms of physical distress      Intensity   THRR 40-80% of Max Heartrate 56-113    Ratings of Perceived Exertion 11-13    Perceived Dyspnea 0-4      Progression   Progression Continue progressive overload as per policy without signs/symptoms or physical distress.      Resistance Training   Training Prescription Yes    Weight 2 lbs    Reps 10-15          Discharge Exercise Prescription (Final Exercise Prescription Changes):  Exercise Prescription Changes - 04/28/24 1632       Response to Exercise   Blood Pressure (Admit) 140/60    Blood Pressure (Exercise) 148/60   over 10 sessions, no exercise BP    Blood Pressure (Exit) 128/70    Heart Rate (Admit) 66 bpm    Heart Rate (Exercise) 86 bpm    Heart Rate (Exit) 71 bpm    Rating of Perceived Exertion (Exercise) 12    Perceived Dyspnea (Exercise) 0    Symptoms none    Comments Pt graduated the [employer] program    Duration Continue with 30 min of aerobic exercise without signs/symptoms of physical distress.    Intensity THRR unchanged      Progression   Progression Continue to progress workloads to maintain intensity without signs/symptoms of physical distress.    Average METs 2.4      Resistance Training   Training Prescription Yes   no wts on wed   Weight 2    Reps 10-15    Time 10 Minutes      Interval Training   Interval Training No      NuStep   Level 2    SPM 59    Minutes 6    METs 3.19      Track   Laps --   post 1560ft   Minutes 6    METs 3.19      Home Exercise Plan   Plans to continue exercise at Home (comment)   walking  Frequency Add 2 additional days to program exercise sessions.    Initial Home Exercises Provided 03/22/24          Functional Capacity:  6 Minute Walk     Row Name 02/01/24 1130 04/28/24 1622       6 Minute Walk   Phase Initial Discharge    Distance 1256 feet 1510 feet    Distance % Change -- 20.22 %    Distance Feet Change -- 254 ft    Walk Time 6 minutes 6 minutes    # of Rest Breaks 0 0    MPH 2.38 2.86    METS 2.01 2.54    RPE 9 13    Perceived Dyspnea  0 0    VO2 Peak 7.1 8.88    Symptoms Yes (comment) No    Comments chronic low back pain 3/10 --    Resting HR 62 bpm 66 bpm    Resting BP 128/60 140/60    Resting Oxygen Saturation  93 % --    Exercise Oxygen Saturation  during 6 min walk 96 % --    Max Ex. HR 76 bpm 86 bpm    Max Ex. BP 156/70 148/60    2 Minute Post BP 130/78 --       Psychological, QOL, Others - Outcomes: PHQ 2/9:    04/28/2024    3:34 PM 02/01/2024   10:52 AM 08/23/2023    3:17 PM 11/24/2022   11:29 AM 08/17/2022    9:17 AM   Depression screen PHQ 2/9  Decreased Interest 0 0 0 0 0  Down, Depressed, Hopeless 0 0 0 0 0  PHQ - 2 Score 0 0 0 0 0  Altered sleeping 1 2     Tired, decreased energy 1 1     Change in appetite 0 0     Feeling bad or failure about yourself  0 0     Trouble concentrating 0 0     Moving slowly or fidgety/restless 1 0     Suicidal thoughts 0 0     PHQ-9 Score 3 3     Difficult doing work/chores Somewhat difficult Somewhat difficult       Quality of Life:  Quality of Life - 04/28/24 1644       Quality of Life Scores   Health/Function Post 23.36 %    Socioeconomic Post 26.43 %    Psych/Spiritual Post 24.86 %    Family Post 28.3 %    GLOBAL Post 25.08 %          Personal Goals: Goals established at orientation with interventions provided to work toward goal.  Personal Goals and Risk Factors at Admission - 02/01/24 1206       Core Components/Risk Factors/Patient Goals on Admission    Weight Management Yes;Obesity;Weight Loss    Intervention Weight Management: Develop a combined nutrition and exercise program designed to reach desired caloric intake, while maintaining appropriate intake of nutrient and fiber, sodium and fats, and appropriate energy expenditure required for the weight goal.;Weight Management: Provide education and appropriate resources to help participant work on and attain dietary goals.;Weight Management/Obesity: Establish reasonable short term and long term weight goals.    Admit Weight 187 lb 14.4 oz (85.2 kg)    Goal Weight: Long Term 176 lb (79.8 kg)    Expected Outcomes Short Term: Continue to assess and modify interventions until short term weight is achieved;Long Term: Adherence to nutrition and physical activity/exercise program  aimed toward attainment of established weight goal;Weight Loss: Understanding of general recommendations for a balanced deficit meal plan, which promotes 1-2 lb weight loss per week and includes a negative energy balance of  [phone] kcal/d;Understanding recommendations for meals to include 15-35% energy as protein, 25-35% energy from fat, 35-60% energy from carbohydrates, less than 200mg  of dietary cholesterol, 20-35 gm of total fiber daily;Understanding of distribution of calorie intake throughout the day with the consumption of 4-5 meals/snacks    Hypertension Yes    Intervention Provide education on lifestyle modifcations including regular physical activity/exercise, weight management, moderate sodium restriction and increased consumption of fresh fruit, vegetables, and low fat dairy, alcohol moderation, and smoking cessation.;Monitor prescription use compliance.    Expected Outcomes Short Term: Continued assessment and intervention until BP is < 140/96mm HG in hypertensive participants. < 130/53mm HG in hypertensive participants with diabetes, heart failure or chronic kidney disease.;Long Term: Maintenance of blood pressure at goal levels.    Lipids Yes    Intervention Provide education and support for participant on nutrition & aerobic/resistive exercise along with prescribed medications to achieve LDL 70mg , HDL >40mg .    Expected Outcomes Short Term: Participant states understanding of desired cholesterol values and is compliant with medications prescribed. Participant is following exercise prescription and nutrition guidelines.;Long Term: Cholesterol controlled with medications as prescribed, with individualized exercise RX and with personalized nutrition plan. Value goals: LDL < 70mg , HDL > 40 mg.           Personal Goals Discharge:  Goals and Risk Factor Review     Row Name 02/07/24 1641 02/11/24 0911 03/08/24 1303 04/05/24 0815 05/11/24 1636     Core Components/Risk Factors/Patient Goals Review   Personal Goals Review Weight Management/Obesity;Hypertension;Lipids Weight Management/Obesity;Hypertension;Lipids Weight Management/Obesity;Hypertension;Lipids Weight Management/Obesity;Hypertension;Lipids Weight  Management/Obesity;Hypertension;Lipids   Review Bruna started cardiac rehab on 02/07/24. Pat did well with exercise. vital signs and CBg's were stable. Pat started cardiac rehab on 02/07/24. Bruna is off to a good start  with exercise. vital signs and CBg's were stable. Bruna is not taking metformin  at this time, Spot checked CBG Bruna is doing well with exercise at cardiac rehab. vital signs have been  stable. Bruna continues to do  well with exercise at cardiac rehab. vital signs have been  stable. Pat and another participant went ot visit [facility]. Bruna is considering continuing her exercise there when she completes cardiac rehab Pat completed exercise at cardiac rehab on 04/28/24   Expected Outcomes Bruna will continue to participate in cardiac rehab for exercise, nutrition and lifestyle modifications Bruna will continue to participate in cardiac rehab for exercise, nutrition and lifestyle modifications Bruna will continue to participate in cardiac rehab for exercise, nutrition and lifestyle modifications Bruna will continue to participate in cardiac rehab for exercise, nutrition and lifestyle modifications Bruna will continue to exercise, follow  nutrition and lifestyle modifications upon completion of cardiac rehab.      Exercise Goals and Review:  Exercise Goals     Row Name 02/01/24 1054             Exercise Goals   Increase Physical Activity Yes       Intervention Provide advice, education, support and counseling about physical activity/exercise needs.;Develop an individualized exercise prescription for aerobic and resistive training based on initial evaluation findings, risk stratification, comorbidities and participant's personal goals.       Expected Outcomes Short Term: Attend rehab on a regular basis to increase amount of physical activity.;Long Term: Add in home  exercise to make exercise part of routine and to increase amount of physical activity.;Long Term: Exercising regularly at least 3-5 days a  week.       Increase Strength and Stamina Yes       Intervention Provide advice, education, support and counseling about physical activity/exercise needs.;Develop an individualized exercise prescription for aerobic and resistive training based on initial evaluation findings, risk stratification, comorbidities and participant's personal goals.       Expected Outcomes Short Term: Increase workloads from initial exercise prescription for resistance, speed, and METs.;Short Term: Perform resistance training exercises routinely during rehab and add in resistance training at home;Long Term: Improve cardiorespiratory fitness, muscular endurance and strength as measured by increased METs and functional capacity ( )       Able to understand and use rate of perceived exertion (RPE) scale Yes       Intervention Provide education and explanation on how to use RPE scale       Expected Outcomes Short Term: Able to use RPE daily in rehab to express subjective intensity level;Long Term:  Able to use RPE to guide intensity level when exercising independently       Knowledge and understanding of Target Heart Rate Range (THRR) Yes       Intervention Provide education and explanation of THRR including how the numbers were predicted and where they are located for reference       Expected Outcomes Short Term: Able to state/look up THRR;Long Term: Able to use THRR to govern intensity when exercising independently;Short Term: Able to use daily as guideline for intensity in rehab       Understanding of Exercise Prescription Yes       Intervention Provide education, explanation, and written materials on patient's individual exercise prescription       Expected Outcomes Short Term: Able to explain program exercise prescription;Long Term: Able to explain home exercise prescription to exercise independently          Exercise Goals Re-Evaluation:  Exercise Goals Re-Evaluation     Row Name 02/07/24 1649 02/21/24 1630 03/10/24  1631 03/22/24 1634 04/28/24 1635     Exercise Goal Re-Evaluation   Exercise Goals Review Increase Physical Activity;Understanding of Exercise Prescription;Increase Strength and Stamina;Knowledge and understanding of Target Heart Rate Range (THRR);Able to understand and use rate of perceived exertion (RPE) scale Increase Physical Activity;Understanding of Exercise Prescription;Increase Strength and Stamina;Knowledge and understanding of Target Heart Rate Range (THRR);Able to understand and use rate of perceived exertion (RPE) scale -- Increase Physical Activity;Understanding of Exercise Prescription;Increase Strength and Stamina;Knowledge and understanding of Target Heart Rate Range (THRR);Able to understand and use rate of perceived exertion (RPE) scale Increase Physical Activity;Understanding of Exercise Prescription;Increase Strength and Stamina;Knowledge and understanding of Target Heart Rate Range (THRR);Able to understand and use rate of perceived exertion (RPE) scale   Comments Pt first day in CRP2 program. Pt tolerated Nustep for 19 min and stopped due to 6/10 R arm pain, no other s/sx reported. Pt avg METs 1.5. Pt is learning her RPE scale, THRR, and ExRx. Reviewed MET's and goals. Pt tolerated exercise well with an average MET level of 2.41. She is feeling much stronger and has increased to 30 mins exercise on two modalities. Shes doing great with walking the track and is increasing her balance. -- Reviewed MET's, goals, home ExRx. Pt tolerated exercise well with an average MET level of 2.26. She has maintained her MET levels constently on the nustep and track. Discussed increasing workload on nustep  soon. Pt feels okay about goals, discussed healthy sleep habits such as creating routine, turning off screens, and trying relaxation techniques to help pt relax before bedtime and she was agreeable to trying them. Discussed home ExRx and starting a walking program, pt feels she can tolerate 15 minutes of  walking 1-2 days per week in addition to CRP2. Encouraged pt to try walking 2 15 minute sessions per day, pt said she would do one and walk the other with her dog. Pt graduated the [employer]. Pt tolerated exercise well with an average MET level of 2.4. She did great in the program and increased on her walk test by 278ft for a total of 1551ft. She will keep up her progress by working out at [facility] 4-5 days for 30-60 mins per session   Expected Outcomes Will continue to progress workloads as tolerated without s/sx. Will continue to progress workloads as tolerated without s/sx. -- Will continue to progress workloads as tolerated without s/sx. Will continue to progress workloads as tolerated without s/sx.      Nutrition & Weight - Outcomes:  Pre Biometrics - 02/01/24 1048       Pre Biometrics   Waist Circumference 43.5 inches    Hip Circumference 43 inches    Waist to Hip Ratio 1.01 %    Triceps Skinfold 23 mm    % Body Fat 43.4 %    Grip Strength 18 kg    Flexibility --   Not performed 4/10 low back pain today   Single Leg Stand 11.5 seconds          Post Biometrics - 04/28/24 1400        Post  Biometrics   Height 5' 5.25 (1.657 m)    Waist Circumference 41.25 inches    Hip Circumference 41 inches    Waist to Hip Ratio 1.01 %    Triceps Skinfold 19 mm    Grip Strength 18 kg    Flexibility --   Unable to reach   Single Leg Stand 15 seconds          Nutrition:  Nutrition Therapy & Goals - 04/28/24 0829       Nutrition Therapy   Diet Heart Healthy Diet    Drug/Food Interactions Statins/Certain Fruits      Personal Nutrition Goals   Nutrition Goal Patient to identify strategies for reducing cardiovascular risk by attending the [employer] education and nutrition series weekly.   goal in action   Personal Goal #2 Patient to improve diet quality by using the plate method as a guide for meal planning to include lean protein/plant protein, fruits, vegetables, whole  grains, nonfat dairy as part of a well-balanced diet.   goal in action.   Comments Goals in action. Bruna has medical history of CAD, HTN, DM2,s/p coronary artery stent placement. Bruna reports that she has started making some dietary changes including decreased saturated fat intake and increased omega 3 rich fish intake. She has decreased refined carbohydrate intake.She is attending the [employer] education series regularly. LDL is at goal. A1c is at goal <7% and has improved into a prediabetic range. She is down 1.5# since starting with our program. Patient will benefit from adherence to nutrition, exercise, and lifestyle modification.      Intervention Plan   Intervention Prescribe, educate and counsel regarding individualized specific dietary modifications aiming towards targeted core components such as weight, hypertension, lipid management, diabetes, heart failure and other comorbidities.;Nutrition handout(s) given to patient.  Expected Outcomes Short Term Goal: Understand basic principles of dietary content, such as calories, fat, sodium, cholesterol and nutrients.;Long Term Goal: Adherence to prescribed nutrition plan.          Nutrition Discharge:  Nutrition Assessments - 02/07/24 1604       Rate Your Plate Scores   Pre Score 64          Education Questionnaire Score:  Knowledge Questionnaire Score - 04/28/24 1648       Knowledge Questionnaire Score   Post Score 22/24          Goals reviewed with patient; copy given to patient.Pt graduates from  Intensive/Traditional cardiac rehab program on 04/28/24 with completion of  56 exercise and education sessions. Pt maintained good attendance and progressed nicely during her  participation in rehab as evidenced by increased MET level. Pat increased her distance on her post exercise test by 254 feet.   Medication list reconciled. Repeat  PHQ score- 3 .  Pt has made significant lifestyle changes and should be commended for her success.  Pat achieved her goals during cardiac rehab.   Pt plans to continue exercise at the [facility] 2-3 days a week. We are proud of Pat's progress!Hadassah Elpidio Quan RN BSN

## 2024-04-28 NOTE — Telephone Encounter (Signed)
 Patient returned RN's call.

## 2024-04-28 NOTE — Telephone Encounter (Signed)
 Called patient left message on personal voice mail to call back.

## 2024-04-28 NOTE — Telephone Encounter (Signed)
 Spoke to patient she stated she takes Brilinta  90 mg twice a day.Stated Brilinta  is causing her to feel extremely tired,makes her shaky. She is not resting well at night.She would like Brilinta  to be changed to a different blood thinner.Advised I will send message to PA that saw you in March for advice.

## 2024-05-11 NOTE — Telephone Encounter (Signed)
 Patient just had 2 stents placed to her proximal and mid RCA in March 2025.  Ideally, patient should be maintained on DAPT for at least 1 year following her intervention.  Brilinta  is a more effective drug particularly with her multiple stenting.  The concerned about potential switching to clopidogrel at this time is that she may not be a complete responder to clopidogrel and this would need to be checked.  At present, I would try to continue the aspirin /Brilinta .  In the future after 1 year could discontinue aspirin  and possibly can reduce Brilinta  to the reduced dose or switch to clopidogrel.

## 2024-05-12 NOTE — Telephone Encounter (Signed)
 Patient is following up. She says her husband assisted her with Praluent  injection and she would like to know if she needs to do anything else. Please advise.

## 2024-05-12 NOTE — Telephone Encounter (Signed)
 Went over the information from Dr Joesphine note- she verbalized understanding. She reports that the bruising is not too bad. Just easy bruising- heals easily and well.

## 2024-05-12 NOTE — Telephone Encounter (Signed)
 Spoke with Pt. Pt accidentally took shot out of her leg to soon. Told her I thought she could wait the 2 weeks until next treatment and then try again but I would confirm with PharmD about that. Went to PharmD and they confirmed. Called pt back and she stated understanding.

## 2024-05-15 ENCOUNTER — Ambulatory Visit: Payer: Self-pay

## 2024-05-15 ENCOUNTER — Other Ambulatory Visit: Payer: Self-pay

## 2024-05-15 ENCOUNTER — Other Ambulatory Visit (HOSPITAL_BASED_OUTPATIENT_CLINIC_OR_DEPARTMENT_OTHER): Payer: Self-pay

## 2024-05-15 ENCOUNTER — Emergency Department (HOSPITAL_BASED_OUTPATIENT_CLINIC_OR_DEPARTMENT_OTHER)
Admission: EM | Admit: 2024-05-15 | Discharge: 2024-05-15 | Disposition: A | Discharge status: Home / Self Care | Attending: Emergency Medicine | Admitting: Emergency Medicine

## 2024-05-15 ENCOUNTER — Other Ambulatory Visit (HOSPITAL_COMMUNITY): Payer: Self-pay

## 2024-05-15 ENCOUNTER — Encounter (HOSPITAL_COMMUNITY): Payer: Self-pay | Admitting: Emergency Medicine

## 2024-05-15 ENCOUNTER — Emergency Department (HOSPITAL_COMMUNITY)
Admission: EM | Admit: 2024-05-15 | Discharge: 2024-05-15 | Disposition: A | Discharge status: Home / Self Care | Source: Home / Self Care | Attending: Emergency Medicine | Admitting: Emergency Medicine

## 2024-05-15 DIAGNOSIS — S90464A Insect bite (nonvenomous), right lesser toe(s), initial encounter: Secondary | ICD-10-CM | POA: Insufficient documentation

## 2024-05-15 DIAGNOSIS — I10 Essential (primary) hypertension: Secondary | ICD-10-CM | POA: Diagnosis not present

## 2024-05-15 DIAGNOSIS — R21 Rash and other nonspecific skin eruption: Secondary | ICD-10-CM | POA: Insufficient documentation

## 2024-05-15 DIAGNOSIS — Z853 Personal history of malignant neoplasm of breast: Secondary | ICD-10-CM | POA: Diagnosis not present

## 2024-05-15 DIAGNOSIS — Z7982 Long term (current) use of aspirin: Secondary | ICD-10-CM | POA: Insufficient documentation

## 2024-05-15 DIAGNOSIS — Z87891 Personal history of nicotine dependence: Secondary | ICD-10-CM | POA: Diagnosis not present

## 2024-05-15 DIAGNOSIS — L03031 Cellulitis of right toe: Secondary | ICD-10-CM | POA: Diagnosis not present

## 2024-05-15 DIAGNOSIS — L089 Local infection of the skin and subcutaneous tissue, unspecified: Secondary | ICD-10-CM | POA: Diagnosis not present

## 2024-05-15 DIAGNOSIS — M79674 Pain in right toe(s): Secondary | ICD-10-CM | POA: Diagnosis present

## 2024-05-15 DIAGNOSIS — L0889 Other specified local infections of the skin and subcutaneous tissue: Secondary | ICD-10-CM | POA: Diagnosis not present

## 2024-05-15 DIAGNOSIS — W57XXXA Bitten or stung by nonvenomous insect and other nonvenomous arthropods, initial encounter: Secondary | ICD-10-CM | POA: Insufficient documentation

## 2024-05-15 MED ORDER — CEFADROXIL 500 MG PO CAPS
500.0000 mg | ORAL_CAPSULE | Freq: Two times a day (BID) | ORAL | 0 refills | Status: DC
  Filled 2024-05-15: qty 12, 6d supply, fill #0

## 2024-05-15 MED ORDER — HYDROCODONE-ACETAMINOPHEN 5-325 MG PO TABS
1.0000 | ORAL_TABLET | Freq: Once | ORAL | Status: AC
  Administered 2024-05-15: 1 via ORAL
  Filled 2024-05-15: qty 1

## 2024-05-15 MED ORDER — AMOXICILLIN-POT CLAVULANATE 875-125 MG PO TABS
1.0000 | ORAL_TABLET | Freq: Two times a day (BID) | ORAL | 0 refills | Status: DC
Start: 2024-05-15 — End: 2024-05-23

## 2024-05-15 MED ORDER — TRIAMCINOLONE ACETONIDE 0.1 % EX CREA
1.0000 | TOPICAL_CREAM | Freq: Two times a day (BID) | CUTANEOUS | 0 refills | Status: DC
  Filled 2024-05-15: qty 30, 15d supply, fill #0

## 2024-05-15 NOTE — ED Triage Notes (Signed)
 Pt in ambulatory with R 2nd toe pain, redness, warmth and swelling - states she thinks she possibly got bitten by a spider 3 days ago. Pt went to [facility] ED today, and was given a rx for Duricef but has not taken a dose due to fear of negative GI side effects, is asking for an IM version of abx instead. Pain has reportedly sharply worsened over the past 2hrs

## 2024-05-15 NOTE — ED Notes (Signed)
Patient verbalizes understanding of discharge instructions. Opportunity for questioning and answers were provided. Armband removed by staff, pt discharged from ED. Ambulated out to lobby with husband

## 2024-05-15 NOTE — ED Provider Notes (Signed)
 [place] EMERGENCY DEPARTMENT AT [facility] Provider Note   CSN: [id number] Arrival date & time: 05/15/24  1343     Patient presents with: Insect Bite   Shelly [name] is a 78 y.o. female.   HPI   78 year old female presents emergency department with concern for insect bite.  States that 2 to 3 days ago was cleaning her closet and noted a scab-like area on the top of her second toe of her right foot.  States this is sitting, redness is extended to the end of her toe.  She has noticed rash extending to her forefoot on the right side.  Describes rash as itching/burning.  Denies any fevers, chills.  Denies any change in hygiene products or known allergen exposure.  Has been trying warm/cold compresses without improvement prompting visit to the emergency department.  Past medical history significant for GERD, IBS, diverticulitis, malignancy  Prior to Admission medications   Medication Sig Start Date End Date Taking? Authorizing Provider  cefadroxil (DURICEF) 500 MG capsule Take 1 capsule (500 mg total) by mouth 2 (two) times daily. 05/15/24  Yes Silver Fell A, PA  triamcinolone  cream (KENALOG ) 0.1 % Apply 1 Application topically 2 (two) times daily. Do not exceed 2 weeks of consecutive treatment. 05/15/24  Yes Silver Fell A, PA  acetaminophen  (TYLENOL ) 500 MG tablet Take 500 mg by mouth every 6 (six) hours as needed for headache or mild pain.    [provider]  Alirocumab  (PRALUENT ) 150 MG/ML SOAJ Inject 1 mL (150 mg total) into the skin every 14 (fourteen) days. 03/21/24   Burnard Debby LABOR, MD  aspirin  EC 81 MG tablet Take 1 tablet (81 mg total) by mouth daily. Swallow whole. 01/19/24   Henry Manuelita NOVAK, NP  clidinium-chlordiazePOXIDE  (LIBRAX ) 5-2.5 MG capsule TAKE 1 CAPSULE EVERY DAY AS NEEDED 03/10/23   Webb, Padonda B, FNP  denosumab  (PROLIA ) 60 MG/ML SOSY injection Inject 60 mg into the skin every 6 (six) months. Last inj was in October    [provider]   loratadine (CLARITIN) 10 MG tablet Take 10 mg by mouth daily.    [provider]  metoprolol  succinate (TOPROL -XL) 25 MG 24 hr tablet Take 1 tablet (25 mg total) by mouth daily. Please keep upcoming appointment in September 2025 for future refills. Thank you 04/14/24   Henry Manuelita B, NP  nitroGLYCERIN  (NITROSTAT ) 0.4 MG SL tablet Place 1 tablet (0.4 mg total) under the tongue every 5 (five) minutes x 3 doses as needed for chest pain. 01/19/24   Henry Manuelita NOVAK, NP  rosuvastatin  (CRESTOR ) 10 MG tablet Take 1 tablet (10 mg total) by mouth daily. Please keep upcoming appointment in September 2025 for future refills. Thank you 04/14/24   Henry Manuelita B, NP  ticagrelor  (BRILINTA ) 90 MG TABS tablet Take 1 tablet (90 mg total) by mouth 2 (two) times daily. 01/19/24   Henry Manuelita NOVAK, NP  omeprazole (PRILOSEC) 40 MG capsule Take 40 mg by mouth daily.  01/25/12  [provider]  pravastatin  (PRAVACHOL ) 40 MG tablet Take 1 tablet (40 mg total) by mouth every evening. 09/18/11 01/25/12  Burchette, Wolm ORN, MD  prochlorperazine  (COMPAZINE ) 10 MG tablet Take 1 tablet (10 mg total) by mouth every 6 (six) hours as needed (Nausea or vomiting). 11/24/19 12/12/19  Lanny Callander, MD    Allergies: Taxotere  [docetaxel ], Abraxane  [paclitaxel  protein-bound part], Amlodipine , Crestor  [rosuvastatin  calcium ], Lipitor [atorvastatin  calcium ], Lisinopril , Losartan , Nsaids, Pravastatin , and Sulfonamide derivatives    Review of  Systems  All other systems reviewed and are negative.   Updated Vital Signs BP (!) 158/57 (BP Location: Right Arm)   Pulse 61   Temp 98.1 F (36.7 C) (Oral)   Resp 16   Ht 5' 5 (1.651 m)   Wt 83.1 kg   SpO2 95%   BMI 30.49 kg/m   Physical Exam Vitals and nursing note reviewed.  Constitutional:      General: She is not in acute distress.    Appearance: She is well-developed.  HENT:     Head: Normocephalic and atraumatic.   Eyes:     Conjunctiva/sclera: Conjunctivae  normal.    Cardiovascular:     Rate and Rhythm: Normal rate and regular rhythm.  Pulmonary:     Effort: Pulmonary effort is normal. No respiratory distress.     Breath sounds: Normal breath sounds.  Abdominal:     Palpations: Abdomen is soft.     Tenderness: There is no abdominal tenderness.   Musculoskeletal:        General: No swelling.     Cervical back: Neck supple.     Comments: Full range of motion bilateral ankles, digits.  Patient with punctate scab-like area dorsal aspect of second toe right foot.  Erythema dorsal aspect of toe is slightly warm to the touch.  Maculopapular rash extending proximal aspect of digit distal second metacarpal right foot.  Pedal and posttibial pulses 2+ bilaterally no obvious bony tenderness.  No areas of palpable fluctuance.   Skin:    General: Skin is warm and dry.     Capillary Refill: Capillary refill takes less than 2 seconds.   Neurological:     Mental Status: She is alert.   Psychiatric:        Mood and Affect: Mood normal.     (all labs ordered are listed, but only abnormal results are displayed) Labs Reviewed - No data to display  EKG: None  Radiology: No results found.   Procedures   Medications Ordered in the ED - No data to display                                  Medical Decision Making Risk Prescription drug management.   This patient presents to the ED for concern of toe redness, this involves an extensive number of treatment options, and is a complaint that carries with it a high risk of complications and morbidity.  The differential diagnosis includes cellulitis, erysipelas, necrotizing infection, osteomyelitis, contact dermatitis, paronychia, felon, tenosynovitis, other   Co morbidities that complicate the patient evaluation  See HPI   Additional history obtained:  Additional history obtained from EMR External records from outside source obtained and reviewed including the records   Lab  Tests:  N/a   Imaging Studies ordered:  N/a   Cardiac Monitoring: / EKG:  N/a   Consultations Obtained:  N/a   Problem List / ED Course / Critical interventions / Medication management  Cellulitis, rash Reevaluation of the patient showed that the patient stayed the same I have reviewed the patients home medicines and have made adjustments as needed   Social Determinants of Health:  Former cigarette use.  Denies illicit drug use.   Test / Admission - Considered:  Cellulitis, rash Vitals signs significant for hypertension blood pressure 150/57. Otherwise within normal range and stable throughout visit. 78 year old female presents emergency department with concern for insect bite.  States  that 2 to 3 days ago was cleaning her closet and noted a scab-like area on the top of her second toe of her right foot.  States this is sitting, redness is extended to the end of her toe.  She has noticed rash extending to her forefoot on the right side.  Describes rash as itching/burning.  Denies any fevers, chills.  Denies any change in hygiene products or known allergen exposure.  Has been trying warm/cold compresses without improvement prompting visit to the emergency department. On exam, erythema appreciated dorsal distal aspect of second digit of right foot.  Maculopapular rash extending into the right forefoot as above.  No pulse deficits symptoms as ischemic limb.  No bony tenderness or reported traumatic mechanism to be suspicious for fracture or dislocation.  Low clinical suspicion for abscess formation.  Distal aspect of patient's second digit concerning for cellulitis given known puncture wound from insect bite.  Patient with appearance of contact dermatitis rash extending into forefoot.  Will treat patient's symptoms with antibiotics and topical corticosteroid for treatment of both pathologies.  Will recommend follow-up with PCP in the outpatient setting for reassessment.  Treatment  plan discussed with patient and she was understanding was agreeable to said plan.  Patient overall well-appearing, afebrile in no acute distress. Worrisome signs and symptoms were discussed with the patient, and the patient acknowledged understanding to return to the ED if noticed. Patient was stable upon discharge.       Final diagnoses:  Insect bite of lesser toe of right foot, initial encounter  Rash    ED Discharge Orders          Ordered    cefadroxil (DURICEF) 500 MG capsule  2 times daily        05/15/24 1541    triamcinolone  cream (KENALOG ) 0.1 %  2 times daily        05/15/24 1541               Silver Wonda LABOR, [place] 05/15/24 1556    Levander Houston, MD 05/16/24 1134

## 2024-05-15 NOTE — ED Triage Notes (Signed)
 Pt via pov from home with right 2nd toe pain, presumably from some kind of insect or spider 2 days ago. Pt states it is worse today than it has been. Pt has redness and swelling to the 2nd toes and top of the right foot. Pt alert & oriented, nad noted.

## 2024-05-15 NOTE — Telephone Encounter (Signed)
 FYI Only or Action Required?: FYI only for provider.  Patient was last seen in primary care on 04/24/2024 by Micheal Wolm ORN, MD. Called Nurse Triage reporting Insect Bite. Symptoms began 3 days ago. Interventions attempted: Rest, hydration, or home remedies. Symptoms are: unchanged.  Triage Disposition: See Physician Within 24 Hours  Patient/caregiver understands and will follow disposition?: Yes  Copied from CRM [id number]. Topic: Clinical - Red Word Triage >> May 15, 2024  8:23 AM Shelly [name] wrote: Red Word that prompted transfer to Nurse Triage: Spider bite, red & swollen Reason for Disposition  [1] Red or very tender (to touch) area AND [2] started over 24 hours after the bite  Answer Assessment - Initial Assessment Questions 1. TYPE of INSECT: What type of insect was it?      Patient is thinking that it is a spider bite 2. ONSET: When did you get bitten?      Patient is thinking she was bit on Friday night 3. LOCATION: Where is the insect bite located?      Right second toe 4. REDNESS: Is the area red or pink? If Yes, ask: What size is area of redness? (inches or cm). When did the redness start?     Yes the area is red. The redness is located around the top of the toe. 5. PAIN: Is there any pain? If Yes, ask: How bad is it?  (Scale 1-10; or mild, moderate, severe)     Mild 6. ITCHING: Does it itch? If Yes, ask: How bad is the itch?    - MILD: doesn't interfere with normal activities   - MODERATE-SEVERE: interferes with work, school, sleep, or other activities      no 7. SWELLING: How big is the swelling? (inches, cm, or compare to coins)     Mild swelling 8. OTHER SYMPTOMS: Do you have any other symptoms?  (e.g., difficulty breathing, hives)     Rash to the toe.  Protocols used: Insect Bite-A-AH

## 2024-05-15 NOTE — Discharge Instructions (Signed)
 It appears you have an infection involving your right second toe.  You may continue to take Duricef antibiotic as prescribed earlier today.  If you felt the medication is not well-tolerated then you may switch to Augmentin  that I have prescribed.  You may take 1 or the other antibiotic but not both.  Follow-up with your doctor for further care, return if you have any concern.

## 2024-05-15 NOTE — Telephone Encounter (Signed)
 Copied from CRM [id number]. Topic: Clinical - Red Word Triage >> May 15, 2024 11:10 AM Larissa RAMAN wrote: [facility] that prompted transfer to Nurse Triage: SPIDER BITE   ----------------------------------------------------------------------- From previous Reason for Contact - Cancel/Reschedule: Patient/patient representative is calling to cancel or reschedule an appointment. Refer to attachments for appointment information.    FYI Only or Action Required?: FYI only for provider.  Patient was last seen in primary care on 04/24/2024 by Micheal Wolm ORN, MD.   Triage Disposition: See Physician Within 24 Hours  Patient/caregiver understands and will follow disposition?: Yes    Reason for Disposition  Nursing judgment or information in reference  Answer Assessment - Initial Assessment Questions 1. REASON FOR CALL: What is your main concern right now?     Wanted to be seen today if possible.    Patient advised that there are no appointments today available. Appointment tomorrow has been added to wait list already.  Protocols used: No Guideline Available-A-AH

## 2024-05-15 NOTE — Discharge Instructions (Addendum)
 As discussed, will return antibiotic pills by mouth given concern for possible bacterial component of the rash on the top of your toe.  He also seems to be allergic component.  Will put you on steroid cream to place over area of rash on your right foot.  Use this twice daily.  Recommend follow-up with primary care for reassessment.  Please do not hesitate to return to emergency department if the worrisome signs and symptoms we discussed become apparent.

## 2024-05-15 NOTE — ED Provider Notes (Signed)
 [place] EMERGENCY DEPARTMENT AT [facility] Provider Note   CSN: [id number] Arrival date & time: 05/15/24  2050     Patient presents with: Insect Bite and Toe Pain   Shelly [name] is a 78 y.o. female.   The history is provided by the patient, the spouse and medical records. No language interpreter was used.  Toe Pain     78 year old female history of prediabetes, breast cancer, hypertension, presenting with concerns of toe infection.  Patient thought that she may have been bit by a spider 3 days ago as she noticed pain and swelling involving her right second toe.  She went to the ER today for a visit with her complaint and subsequently was discharged home with Duricef and triamcinolone  patient states she was reading the potential side effect of the antibiotic and was concerned that it may upset her stomach.  She has not taken it yet but presented today requesting for a different antibiotic perhaps something intramuscular.  She endorsed a throbbing burning pain to her toe and has noticed increased redness.  She does not endorse any fever she did not see a spider that bit her and she denies any significant itchiness.  Prior to Admission medications   Medication Sig Start Date End Date Taking? Authorizing Provider  acetaminophen  (TYLENOL ) 500 MG tablet Take 500 mg by mouth every 6 (six) hours as needed for headache or mild pain.    [provider]  Alirocumab  (PRALUENT ) 150 MG/ML SOAJ Inject 1 mL (150 mg total) into the skin every 14 (fourteen) days. 03/21/24   Burnard Debby LABOR, MD  aspirin  EC 81 MG tablet Take 1 tablet (81 mg total) by mouth daily. Swallow whole. 01/19/24   Henry Manuelita NOVAK, NP  cefadroxil (DURICEF) 500 MG capsule Take 1 capsule (500 mg total) by mouth 2 (two) times daily. 05/15/24   Silver Wonda LABOR, PA  clidinium-chlordiazePOXIDE  (LIBRAX ) 5-2.5 MG capsule TAKE 1 CAPSULE EVERY DAY AS NEEDED 03/10/23   Webb, Padonda B, FNP  denosumab  (PROLIA ) 60 MG/ML  SOSY injection Inject 60 mg into the skin every 6 (six) months. Last inj was in October    [provider]  loratadine (CLARITIN) 10 MG tablet Take 10 mg by mouth daily.    [provider]  metoprolol  succinate (TOPROL -XL) 25 MG 24 hr tablet Take 1 tablet (25 mg total) by mouth daily. Please keep upcoming appointment in September 2025 for future refills. Thank you 04/14/24   Henry Manuelita NOVAK, NP  nitroGLYCERIN  (NITROSTAT ) 0.4 MG SL tablet Place 1 tablet (0.4 mg total) under the tongue every 5 (five) minutes x 3 doses as needed for chest pain. 01/19/24   Henry Manuelita NOVAK, NP  rosuvastatin  (CRESTOR ) 10 MG tablet Take 1 tablet (10 mg total) by mouth daily. Please keep upcoming appointment in September 2025 for future refills. Thank you 04/14/24   Henry Manuelita B, NP  ticagrelor  (BRILINTA ) 90 MG TABS tablet Take 1 tablet (90 mg total) by mouth 2 (two) times daily. 01/19/24   Henry Manuelita NOVAK, NP  triamcinolone  cream (KENALOG ) 0.1 % Apply 1 Application topically 2 (two) times daily. Do not exceed 2 weeks of consecutive treatment. 05/15/24   Silver Wonda LABOR, PA  omeprazole (PRILOSEC) 40 MG capsule Take 40 mg by mouth daily.  01/25/12  [provider]  pravastatin  (PRAVACHOL ) 40 MG tablet Take 1 tablet (40 mg total) by mouth every evening. 09/18/11 01/25/12  Burchette, Wolm ORN, MD  prochlorperazine  (COMPAZINE ) 10 MG  tablet Take 1 tablet (10 mg total) by mouth every 6 (six) hours as needed (Nausea or vomiting). 11/24/19 12/12/19  Lanny Callander, MD    Allergies: Taxotere  [docetaxel ], Abraxane  [paclitaxel  protein-bound part], Amlodipine , Crestor  [rosuvastatin  calcium ], Lipitor [atorvastatin  calcium ], Lisinopril , Losartan , Nsaids, Pravastatin , and Sulfonamide derivatives    Review of Systems  Constitutional:  Negative for fever.  Skin:  Positive for rash.    Updated Vital Signs BP (!) 177/80 (BP Location: Left Arm)   Pulse 79   Temp 98.1 F (36.7 C)   Resp 18   Wt 83.1 kg   SpO2  95%   BMI 30.49 kg/m   Physical Exam Vitals and nursing note reviewed.  Constitutional:      General: She is not in acute distress.    Appearance: She is well-developed.  HENT:     Head: Atraumatic.   Eyes:     Conjunctiva/sclera: Conjunctivae normal.    Musculoskeletal:     Cervical back: Neck supple.   Skin:    Findings: No rash (Right foot, second toe: Toe is erythematous and edematous with tenderness to palpation.  There several 2mm maculopapular lesions on the dorsum of the toe extending towards the foot.  Brisk cap refill no nail involvement.).   Neurological:     Mental Status: She is alert.     (all labs ordered are listed, but only abnormal results are displayed) Labs Reviewed - No data to display  EKG: None  Radiology: No results found.   Procedures   Medications Ordered in the ED  HYDROcodone -acetaminophen  (NORCO/VICODIN) 5-325 MG per tablet 1 tablet (1 tablet Oral Given 05/15/24 2132)                                    Medical Decision Making  BP (!) 177/80 (BP Location: Left Arm)   Pulse 79   Temp 98.1 F (36.7 C)   Resp 18   Wt 83.1 kg   SpO2 95%   BMI 30.49 kg/m   35:73 PM  78 year old female history of prediabetes, breast cancer, hypertension, presenting with concerns of toe infection.  Patient thought that she may have been bit by a spider 3 days ago as she noticed pain and swelling involving her right second toe.  She went to the ER today for a visit with her complaint and subsequently was discharged home with Duricef and triamcinolone  patient states she was reading the potential side effect of the antibiotic and was concerned that it may upset her stomach.  She has not taken it yet but presented today requesting for a different antibiotic perhaps something intramuscular.  She endorsed a throbbing burning pain to her toe and has noticed increased redness.  She does not endorse any fever she did not see a spider that bit her and she denies any  significant itchiness.  On exam, patient does have erythema involving her right second toe with several maculopapular lesions on the dorsum of the toe.  Finding consistent with cellulitis.  No abscess.  I encourage patient to continue to take Duricef as previously prescribed however I agreed to prescribe amoxicillin  to have on hand if she cannot tolerates Duracef.  I gave pt return precaution.      Final diagnoses:  Toe infection    ED Discharge Orders          Ordered    amoxicillin -clavulanate (AUGMENTIN ) 875-125 MG tablet  Every 12  hours        05/15/24 2125               Nivia Colon, PA-C 05/15/24 2141    Suzette Pac, MD 05/17/24 1131

## 2024-05-16 ENCOUNTER — Other Ambulatory Visit (HOSPITAL_COMMUNITY): Payer: Self-pay

## 2024-05-16 ENCOUNTER — Encounter (HOSPITAL_COMMUNITY): Payer: Self-pay | Admitting: *Deleted

## 2024-05-16 ENCOUNTER — Other Ambulatory Visit (HOSPITAL_BASED_OUTPATIENT_CLINIC_OR_DEPARTMENT_OTHER): Payer: Self-pay

## 2024-05-16 ENCOUNTER — Emergency Department (HOSPITAL_COMMUNITY)
Admission: EM | Admit: 2024-05-16 | Discharge: 2024-05-16 | Discharge status: Against Medical Advice | Attending: Emergency Medicine | Admitting: Emergency Medicine

## 2024-05-16 ENCOUNTER — Ambulatory Visit: Admitting: Family Medicine

## 2024-05-16 ENCOUNTER — Other Ambulatory Visit: Payer: Self-pay

## 2024-05-16 ENCOUNTER — Telehealth: Payer: Self-pay

## 2024-05-16 DIAGNOSIS — Z5321 Procedure and treatment not carried out due to patient leaving prior to being seen by health care provider: Secondary | ICD-10-CM | POA: Insufficient documentation

## 2024-05-16 DIAGNOSIS — M79671 Pain in right foot: Secondary | ICD-10-CM | POA: Diagnosis not present

## 2024-05-16 NOTE — ED Triage Notes (Signed)
 Pt seen last night for suspected spider bite to right second toe. Redness and swelling note to foot. Has taken 2 amoxicillin  that was prescribed and given a cream. Reports throbbing pain to foot

## 2024-05-16 NOTE — Transitions of Care (Post Inpatient/ED Visit) (Signed)
 05/16/2024  Name: Shelly [name] MRN: [id number] DOB: [birth year]  Today's TOC FU Call Status: Today's TOC FU Call Status:: Successful TOC FU Call Completed TOC FU Call Complete Date: 05/16/24 Patient's Name and Date of Birth confirmed.  Transition Care Management Follow-up Telephone Call Date of Discharge: 05/15/24 Discharge Facility: [facility] (also seen at [facility] 6/30) Type of Discharge: Emergency Department Reason for ED Visit: Other: (Bug bite and skin infection) How have you been since you were released from the hospital?: Same Any questions or concerns?: No  Items Reviewed: Did you receive and understand the discharge instructions provided?: Yes Medications obtained,verified, and reconciled?: Yes (Medications Reviewed) Any new allergies since your discharge?: No Dietary orders reviewed?: NA Do you have support at home?: Yes People in Home [RPT]: spouse Name of Support/Comfort Primary Source: Husband  Medications Reviewed Today: Medications Reviewed Today     Reviewed by Brien Rea LABOR, CMA (Certified Medical Assistant) on 05/16/24 at 1345  Med List Status: <None>   Medication Order Taking? Sig Documenting Provider Last Dose Status Informant  acetaminophen  (TYLENOL ) 500 MG tablet [id number] Yes Take 500 mg by mouth every 6 (six) hours as needed for headache or mild pain. [provider]  Active Self, Pharmacy Records  Alirocumab  (PRALUENT ) 150 MG/ML SOAJ [id number] Yes Inject 1 mL (150 mg total) into the skin every 14 (fourteen) days. Burnard Debby LABOR, MD  Active   amoxicillin -clavulanate (AUGMENTIN ) 875-125 MG tablet [id number] Yes Take 1 tablet by mouth every 12 (twelve) hours. Nivia Colon, PA-C  Active   aspirin  EC 81 MG tablet [id number] Yes Take 1 tablet (81 mg total) by mouth daily. Swallow whole. Henry Manuelita NOVAK, NP  Active   cefadroxil (DURICEF) 500 MG capsule [id number] Yes Take 1 capsule (500 mg total) by mouth 2 (two) times daily.  Silver Fell A, [place]  Active   clidinium-chlordiazePOXIDE  (LIBRAX ) 5-2.5 MG capsule [id number] Yes TAKE 1 CAPSULE EVERY DAY AS NEEDED Webb, Padonda B, FNP  Active Self, Pharmacy Records  denosumab  (PROLIA ) 60 MG/ML SOSY injection [id number] Yes Inject 60 mg into the skin every 6 (six) months. Last inj was in October [provider]  Active Self, Pharmacy Records           Med Note CARLEEN, Gordon D   Mon Jan 17, 2024  9:38 AM) Next dose expected in April  loratadine (CLARITIN) 10 MG tablet [id number] Yes Take 10 mg by mouth daily. [provider]  Active Self, Pharmacy Records  metoprolol  succinate (TOPROL -XL) 25 MG 24 hr tablet [id number] Yes Take 1 tablet (25 mg total) by mouth daily. Please keep upcoming appointment in September 2025 for future refills. Thank you Henry Manuelita NOVAK, NP  Active   nitroGLYCERIN  (NITROSTAT ) 0.4 MG SL tablet [id number] Yes Place 1 tablet (0.4 mg total) under the tongue every 5 (five) minutes x 3 doses as needed for chest pain. Henry Manuelita NOVAK, NP  Active     Discontinued 01/25/12 1113 (Error)     Discontinued 01/25/12 1106 (Error)     Discontinued 12/12/19 2049   rosuvastatin  (CRESTOR ) 10 MG tablet [id number] Yes Take 1 tablet (10 mg total) by mouth daily. Please keep upcoming appointment in September 2025 for future refills. Thank you Henry Manuelita NOVAK, NP  Active   ticagrelor  (BRILINTA ) 90 MG TABS tablet [id number] Yes Take 1 tablet (90 mg total) by mouth 2 (two) times daily. Henry Manuelita NOVAK, NP  Active   triamcinolone  cream (KENALOG ) 0.1 % [id number] Yes Apply 1  Application topically 2 (two) times daily. Do not exceed 2 weeks of consecutive treatment. Silver Wonda LABOR, PA  Active             Home Care and Equipment/Supplies: Were Home Health Services Ordered?: NA Any new equipment or medical supplies ordered?: NA  Functional Questionnaire: Do you need assistance with bathing/showering or dressing?: No Do you need assistance with  meal preparation?: No Do you need assistance with eating?: No Do you have difficulty maintaining continence: No Do you need assistance with getting out of bed/getting out of a chair/moving?: No Do you have difficulty managing or taking your medications?: No  Follow up appointments reviewed: PCP Follow-up appointment confirmed?: Yes Date of PCP follow-up appointment?: 05/23/24 Follow-up Provider: Dr. Micheal Specialist [facility] Follow-up appointment confirmed?: NA Do you need transportation to your follow-up appointment?: No Do you understand care options if your condition(s) worsen?: Yes-patient verbalized understanding    SIGNATURE: Rea LABOR Silvan CMA 1:53 pm

## 2024-05-17 ENCOUNTER — Encounter: Payer: Self-pay | Admitting: Family Medicine

## 2024-05-17 ENCOUNTER — Ambulatory Visit (INDEPENDENT_AMBULATORY_CARE_PROVIDER_SITE_OTHER): Admitting: Family Medicine

## 2024-05-17 VITALS — BP 142/62 | HR 66 | Temp 97.8°F | Wt 182.6 lb

## 2024-05-17 DIAGNOSIS — L239 Allergic contact dermatitis, unspecified cause: Secondary | ICD-10-CM

## 2024-05-17 DIAGNOSIS — L259 Unspecified contact dermatitis, unspecified cause: Secondary | ICD-10-CM | POA: Diagnosis not present

## 2024-05-17 MED ORDER — METHYLPREDNISOLONE ACETATE 80 MG/ML IJ SUSP
80.0000 mg | Freq: Once | INTRAMUSCULAR | Status: AC
  Administered 2024-05-17: 80 mg via INTRAMUSCULAR

## 2024-05-17 NOTE — Patient Instructions (Signed)
 Elevate foot frequently  Finish out antibiotic.  Follow up for any fever or progressive swelling or redness.

## 2024-05-17 NOTE — Progress Notes (Signed)
 Established Patient Office Visit  Subjective   Patient ID: Shelly [name], female    DOB: 05/20/1946  Age: 78 y.o. MRN: [id number]  Chief Complaint  Patient presents with   Hospitalization Follow-up    HPI   Ms. Dolley seen today accompanied by husband with recent rash right foot.  This past Sunday she recalls noticing some pruritus.  When she looked down she saw area of redness and swelling and thought perhaps she had an insect bite or spider bite.  She actually went to the ER twice on 6/30.  First time went to [facility] and was thought to have possible cellulitis and prescribed Duricef and triamcinolone  cream.  She had concerns about possible adverse effects with Duricef and never got that filled.  She then went to [facility], ER that night and was prescribed Augmentin  and currently on her third pill.  She went back to the ER again last night but left before being seen because of a long wait.  Denies any recent fevers or chills.  Has involvement of the right foot 2nd and 3rd toes.  Erythematous rash with some vesiculation.  No skin necrosis.  No right leg or thigh rash.  Past Medical History:  Diagnosis Date   Allergy    Arthritis    wrist, shoulder    Cancer (HCC) 08/2019   left breast IDC   Diverticulitis    Endometrial polyp    Family history of breast cancer    GERD (gastroesophageal reflux disease)    occasional - diet controlled   Hx of adenomatous colonic polyps    Hyperlipidemia    IBS (irritable bowel syndrome)    Missed abortion    x 1 - resolved - no surgery   Osteoporosis    SVD (spontaneous vaginal delivery)    x 1   Transient vision disturbance 2019   was evaluated by ED, no findings   Vitamin D  deficiency    Past Surgical History:  Procedure Laterality Date   BREAST EXCISIONAL BIOPSY Right 08/2019   Benign, lesion    BREAST LUMPECTOMY Left 09/2019   BREAST LUMPECTOMY WITH RADIOACTIVE SEED AND SENTINEL LYMPH NODE BIOPSY Left 10/06/2019    Procedure: LEFT BREAST LUMPECTOMY  X2 WITH RADIOACTIVE SEED X2  AND SENTINEL LYMPH NODE BIOPSY;  Surgeon: Curvin Deward MOULD, MD;  Location: [facility];  Service: General;  Laterality: Left;   BREAST LUMPECTOMY WITH RADIOACTIVE SEED LOCALIZATION Right 10/06/2019   Procedure: RIGHT BREAST LUMPECTOMY WITH RADIOACTIVE SEED LOCALIZATION;  Surgeon: Curvin Deward MOULD, MD;  Location: [facility];  Service: General;  Laterality: Right;   BRONCHIAL BIOPSY  12/27/2020   Procedure: BRONCHIAL BIOPSIES;  Surgeon: Brenna Adine CROME, DO;  Location: [facility];  Service: Pulmonary;;   BRONCHIAL BRUSHINGS  12/27/2020   Procedure: BRONCHIAL BRUSHINGS;  Surgeon: Brenna Adine CROME, DO;  Location: [facility];  Service: Pulmonary;;   BRONCHIAL NEEDLE ASPIRATION BIOPSY  12/27/2020   Procedure: BRONCHIAL NEEDLE ASPIRATION BIOPSIES;  Surgeon: Brenna Adine CROME, DO;  Location: [facility];  Service: Pulmonary;;   BRONCHIAL WASHINGS  12/27/2020   Procedure: BRONCHIAL WASHINGS;  Surgeon: Brenna Adine CROME, DO;  [facility]: [facility];  Service: Pulmonary;;   COLONOSCOPY     CORONARY ANGIOGRAPHY N/A 01/18/2024   Procedure: CORONARY ANGIOGRAPHY;  Surgeon: Wendel Lurena POUR, MD;  [facility];  Service: Cardiovascular;  Laterality: N/A;   CORONARY IMAGING/OCT N/A 01/18/2024   Procedure: CORONARY IMAGING/OCT;  Surgeon: Thukkani, Arun K,  MD;  [facility];  Service: Cardiovascular;  Laterality: N/A;   CORONARY STENT INTERVENTION N/A 01/18/2024   Procedure: CORONARY STENT INTERVENTION;  Surgeon: Wendel Lurena POUR, MD;  [facility];  Service: Cardiovascular;  Laterality: N/A;   DIAGNOSTIC LAPAROSCOPY     cysts   DILATATION & CURETTAGE/HYSTEROSCOPY WITH TRUECLEAR N/A 10/31/2013   Procedure: DILATATION & CURETTAGE/HYSTEROSCOPY WITH TRUCLEAR;  Surgeon: Truman Corona, MD;  Location: [facility];  Service: Gynecology;  Laterality: N/A;   DILATION AND CURETTAGE OF UTERUS      MASS EXCISION Left 11/06/2019   Procedure: EXCISION NIPPLE AND AREOLA  MARGIN LEFT BREAST;  Surgeon: Curvin Deward MOULD, MD;  Location: [facility];  Service: General;  Laterality: Left;   TONSILLECTOMY  1952   VIDEO BRONCHOSCOPY WITH ENDOBRONCHIAL NAVIGATION Bilateral 12/27/2020   Procedure: VIDEO BRONCHOSCOPY WITH ENDOBRONCHIAL NAVIGATION;  Surgeon: Brenna Adine CROME, DO;  [facility];  Service: Pulmonary;  Laterality: Bilateral;   VIDEO BRONCHOSCOPY WITH ENDOBRONCHIAL ULTRASOUND Bilateral 12/27/2020   Procedure: VIDEO BRONCHOSCOPY WITH ENDOBRONCHIAL ULTRASOUND;  Surgeon: Brenna Adine CROME, DO;  Location: [facility];  Service: Pulmonary;  Laterality: Bilateral;   WISDOM TOOTH EXTRACTION      reports that she quit smoking about 17 years ago. Her smoking use included cigarettes. She started smoking about 37 years ago. She has a 2 pack-year smoking history. She has never used smokeless tobacco. She reports current alcohol use of about 14.0 standard drinks of alcohol per week. She reports that she does not use drugs. family history includes Brain cancer in her paternal uncle; Breast cancer (age of onset: 60) in her cousin; Breast cancer (age of onset: 58) in her maternal aunt and maternal aunt; Cancer in her maternal grandfather, maternal uncle, and another family member; Colon polyps in her mother; Depression in her brother; Diabetes in her mother; Heart disease in an other family member; Hyperlipidemia in her father and mother; Hypertension in her mother and another family member; Leukemia (age of onset: 108) in her mother; Lung cancer in her father and maternal grandfather; Throat cancer in her paternal uncle; Uterine cancer in her paternal grandmother. Allergies  Allergen Reactions   Taxotere  [Docetaxel ] Swelling   Abraxane  [Paclitaxel  Protein-Bound Part] Other (See Comments)    3 days after receiving she had one episode of very high BP and HR. This happened again 2 days later  during the night.    Amlodipine      Caused IBS flare-up. Returned after stopping and restarting med   Crestor  [Rosuvastatin  Calcium ] Itching    GI upset   Lipitor [Atorvastatin  Calcium ] Itching   Lisinopril  Cough   Losartan      Diffuse arthralgias and back pain. Returned after stopping and restarting med   Nsaids Other (See Comments)    GI upset   Pravastatin  Itching    GI upset   Sulfonamide Derivatives Rash    Hives, itiching     Review of Systems  Constitutional:  Negative for chills and fever.  Skin:  Positive for rash.      Objective:     BP (!) 142/62 (BP Location: Left Arm, Patient Position: Sitting, Cuff Size: Normal)   Pulse 66   Temp 97.8 F (36.6 C) (Oral)   Wt 182 lb 9.6 oz (82.8 kg)   SpO2 95%   BMI 32.35 kg/m  BP Readings from Last 3 Encounters:  05/17/24 (!) 142/62  05/16/24 (!) 178/74  05/15/24 (!) 177/80   Wt Readings from  Last 3 Encounters:  05/17/24 182 lb 9.6 oz (82.8 kg)  05/16/24 183 lb (83 kg)  05/15/24 183 lb 3.2 oz (83.1 kg)      Physical Exam Vitals reviewed.  Constitutional:      General: She is not in acute distress.    Appearance: She is not ill-appearing.  Skin:    Findings: Rash present.     Comments: She has some mild erythema involving right foot 2nd and 3rd toes with some vesiculation dorsally.  Mild purpuric changes.  No visible break in the skin.  No necrosis.  Foot is warm to touch with excellent distal pulses.  Neurological:     Mental Status: She is alert.      No results found for any visits on 05/17/24.    The 10-year ASCVD risk score (Arnett DK, et al., 2019) is: 58%    Assessment & Plan:   Problem List Items Addressed This Visit   None Visit Diagnoses       Contact allergic reaction    -  Primary     Contact dermatitis, unspecified contact dermatitis type, unspecified trigger       Relevant Medications   methylPREDNISolone  acetate (DEPO-MEDROL ) injection 80 mg (Completed)     Patient presents  with suspected purpuric contact dermatitis.  She does not have any warmth or erythematous streaking or other indications of advancing cellulitis at this time.  No necrosis.  Good distal pulses.  -Finish out current antibiotic although this really does not look like any kind of classic cellulitis with vesiculation and initial pruritus - Discussed Depo-Medrol  80 mg IM to hopefully counter allergic reaction and patient consents.  We reviewed potential side effects. - Follow-up promptly for any fever, progressive redness, or other concerns.  She is aware this will take several days if not weeks to fully resolve  No follow-ups on file.    Wolm Scarlet, MD

## 2024-05-23 ENCOUNTER — Ambulatory Visit (INDEPENDENT_AMBULATORY_CARE_PROVIDER_SITE_OTHER): Admitting: Family Medicine

## 2024-05-23 ENCOUNTER — Encounter: Payer: Self-pay | Admitting: Family Medicine

## 2024-05-23 VITALS — BP 124/60 | HR 60 | Temp 97.8°F | Wt 183.5 lb

## 2024-05-23 DIAGNOSIS — I519 Heart disease, unspecified: Secondary | ICD-10-CM | POA: Diagnosis not present

## 2024-05-23 DIAGNOSIS — L239 Allergic contact dermatitis, unspecified cause: Secondary | ICD-10-CM | POA: Diagnosis not present

## 2024-05-23 DIAGNOSIS — I10 Essential (primary) hypertension: Secondary | ICD-10-CM | POA: Diagnosis not present

## 2024-05-23 DIAGNOSIS — D692 Other nonthrombocytopenic purpura: Secondary | ICD-10-CM

## 2024-05-23 DIAGNOSIS — E118 Type 2 diabetes mellitus with unspecified complications: Secondary | ICD-10-CM | POA: Diagnosis not present

## 2024-05-23 NOTE — Progress Notes (Signed)
 Established Patient Office Visit  Subjective   Patient ID: Shelly [name], female    DOB: 1946/08/23  Age: 78 y.o. MRN: [id number]  Chief Complaint  Patient presents with   Medical Management of Chronic Issues    HPI   Shelly [name] seen for follow-up regarding recent rash right foot 2nd and 3rd toes.  She initially thought she had infection related to some sort of bite.  She went to the ER twice and was given 2 different course of antibiotics for presumed cellulitis.  When we saw her she had no fever or warmth and we felt like this more likely represented a purpuric contact dermatitis.  We gave her Depo-Medrol  80 mg IM and she immediately saw some improvement by the next day.  Less swelling.  Less redness.  Rash is greatly improved since then.  In retrospect, she recalls putting some kind of foot balm on just the right foot the day before the rash started and wonders if that may have been the trigger for her reaction.  She threw that to both foot balm away.  She recently got away from her exercise with walking and cycling.  Recently finished up cardiac rehab and to be doing very good with consistent exercise until this rash came up.  She hopes to get back to her exercise this week.  She and her husband are made positive dietary changes.  She is eating out less.  Had some very helpful nutritional counseling during her cardiac rehab.  Has lost some weight and feels better overall.  Lipids currently being treated with Praluent  and Crestor  and tolerating well.  Recent lipids greatly improved.  No recent chest pains.  Blood pressure also stable.  Previous intolerance with multiple antihypertensive medications. She has history of type 2 diabetes and recent A1c 6.4% and she has been out of manages thus far without diabetic medications  Past Medical History:  Diagnosis Date   Allergy    Arthritis    wrist, shoulder    Cancer (HCC) 08/2019   left breast IDC   Diverticulitis    Endometrial polyp     Family history of breast cancer    GERD (gastroesophageal reflux disease)    occasional - diet controlled   Hx of adenomatous colonic polyps    Hyperlipidemia    IBS (irritable bowel syndrome)    Missed abortion    x 1 - resolved - no surgery   Osteoporosis    SVD (spontaneous vaginal delivery)    x 1   Transient vision disturbance 2019   was evaluated by ED, no findings   Vitamin D  deficiency    Past Surgical History:  Procedure Laterality Date   BREAST EXCISIONAL BIOPSY Right 08/2019   Benign, lesion    BREAST LUMPECTOMY Left 09/2019   BREAST LUMPECTOMY WITH RADIOACTIVE SEED AND SENTINEL LYMPH NODE BIOPSY Left 10/06/2019   Procedure: LEFT BREAST LUMPECTOMY  X2 WITH RADIOACTIVE SEED X2  AND SENTINEL LYMPH NODE BIOPSY;  Surgeon: Curvin Deward MOULD, MD;  Location: [facility];  Service: General;  Laterality: Left;   BREAST LUMPECTOMY WITH RADIOACTIVE SEED LOCALIZATION Right 10/06/2019   Procedure: RIGHT BREAST LUMPECTOMY WITH RADIOACTIVE SEED LOCALIZATION;  Surgeon: Curvin Deward MOULD, MD;  Location: [facility];  Service: General;  Laterality: Right;   BRONCHIAL BIOPSY  12/27/2020   Procedure: BRONCHIAL BIOPSIES;  Surgeon: Brenna Adine CROME, DO;  Location[facility];  Service: Pulmonary;;   BRONCHIAL BRUSHINGS  12/27/2020  Procedure: BRONCHIAL BRUSHINGS;  Surgeon: Brenna Adine CROME, DO;  [facility];  Service: Pulmonary;;   BRONCHIAL NEEDLE ASPIRATION BIOPSY  12/27/2020   Procedure: BRONCHIAL NEEDLE ASPIRATION BIOPSIES;  Surgeon: Brenna Adine CROME, DO;  Location[facility];  Service: Pulmonary;;   BRONCHIAL WASHINGS  12/27/2020   Procedure: BRONCHIAL WASHINGS;  Surgeon: Brenna Adine CROME, DO;  [facility];  Service: Pulmonary;;   COLONOSCOPY     CORONARY ANGIOGRAPHY N/A 01/18/2024   Procedure: CORONARY ANGIOGRAPHY;  Surgeon: Wendel Lurena POUR, MD;  [facility];  Service: Cardiovascular;  Laterality: N/A;   CORONARY  IMAGING/OCT N/A 01/18/2024   Procedure: CORONARY IMAGING/OCT;  Surgeon: Wendel Lurena POUR, MD;  [facility];  Service: Cardiovascular;  Laterality: N/A;   CORONARY STENT INTERVENTION N/A 01/18/2024   Procedure: CORONARY STENT INTERVENTION;  Surgeon: Wendel Lurena POUR, MD;  [facility];  Service: Cardiovascular;  Laterality: N/A;   DIAGNOSTIC LAPAROSCOPY     cysts   DILATATION & CURETTAGE/HYSTEROSCOPY WITH TRUECLEAR N/A 10/31/2013   Procedure: DILATATION & CURETTAGE/HYSTEROSCOPY WITH TRUCLEAR;  Surgeon: Truman Corona, MD;  Location: [facility];  Service: Gynecology;  Laterality: N/A;   DILATION AND CURETTAGE OF UTERUS     MASS EXCISION Left 11/06/2019   Procedure: EXCISION NIPPLE AND AREOLA  MARGIN LEFT BREAST;  Surgeon: Curvin Deward MOULD, MD;  Location: [facility];  Service: General;  Laterality: Left;   TONSILLECTOMY  1952   VIDEO BRONCHOSCOPY WITH ENDOBRONCHIAL NAVIGATION Bilateral 12/27/2020   Procedure: VIDEO BRONCHOSCOPY WITH ENDOBRONCHIAL NAVIGATION;  Surgeon: Brenna Adine CROME, DO;  Location[facility];  Service: Pulmonary;  Laterality: Bilateral;   VIDEO BRONCHOSCOPY WITH ENDOBRONCHIAL ULTRASOUND Bilateral 12/27/2020   Procedure: VIDEO BRONCHOSCOPY WITH ENDOBRONCHIAL ULTRASOUND;  Surgeon: Brenna Adine CROME, DO;  Location: [facility];  Service: Pulmonary;  Laterality: Bilateral;   WISDOM TOOTH EXTRACTION      reports that she quit smoking about 17 years ago. Her smoking use included cigarettes. She started smoking about 37 years ago. She has a 2 pack-year smoking history. She has never used smokeless tobacco. She reports current alcohol use of about 14.0 standard drinks of alcohol per week. She reports that she does not use drugs. family history includes Brain cancer in her paternal uncle; Breast cancer (age of onset: 63) in her cousin; Breast cancer (age of onset: 36) in her maternal aunt and maternal aunt; Cancer in her maternal grandfather, maternal  uncle, and another family member; Colon polyps in her mother; Depression in her brother; Diabetes in her mother; Heart disease in an other family member; Hyperlipidemia in her father and mother; Hypertension in her mother and another family member; Leukemia (age of onset: 23) in her mother; Lung cancer in her father and maternal grandfather; Throat cancer in her paternal uncle; Uterine cancer in her paternal grandmother. Allergies  Allergen Reactions   Taxotere  [Docetaxel ] Swelling   Abraxane  [Paclitaxel  Protein-Bound Part] Other (See Comments)    3 days after receiving she had one episode of very high BP and HR. This happened again 2 days later during the night.    Amlodipine      Caused IBS flare-up. Returned after stopping and restarting med   Crestor  [Rosuvastatin  Calcium ] Itching    GI upset   Lipitor [Atorvastatin  Calcium ] Itching   Lisinopril  Cough   Losartan      Diffuse arthralgias and back pain. Returned after stopping and restarting med   Nsaids Other (See Comments)    GI  upset   Pravastatin  Itching    GI upset   Sulfonamide Derivatives Rash    Hives, itiching    Review of Systems  Constitutional:  Negative for chills, fever and malaise/fatigue.  Eyes:  Negative for blurred vision.  Respiratory:  Negative for shortness of breath.   Cardiovascular:  Negative for chest pain.  Neurological:  Negative for dizziness, weakness and headaches.      Objective:     BP 124/60 (BP Location: Left Arm, Patient Position: Sitting, Cuff Size: Normal)   Pulse 60   Temp 97.8 F (36.6 C) (Oral)   Wt 183 lb 8 oz (83.2 kg)   SpO2 94%   BMI 32.51 kg/m  BP Readings from Last 3 Encounters:  05/23/24 124/60  05/17/24 (!) 142/62  05/16/24 (!) 178/74   Wt Readings from Last 3 Encounters:  05/23/24 183 lb 8 oz (83.2 kg)  05/17/24 182 lb 9.6 oz (82.8 kg)  05/16/24 183 lb (83 kg)      Physical Exam Vitals reviewed.  Constitutional:      General: She is not in acute distress.     Appearance: She is not ill-appearing.  Cardiovascular:     Rate and Rhythm: Normal rate and regular rhythm.  Pulmonary:     Effort: Pulmonary effort is normal.     Breath sounds: Normal breath sounds.  Skin:    Comments: Right foot examined.  She still has some purpura changes involving the right second and to a lesser extent third toe.  Much less erythema.  No warmth.  Nontender.  Less edema.  No skin necrosis  Neurological:     Mental Status: She is alert.      No results found for any visits on 05/23/24.    The 10-year ASCVD risk score (Arnett DK, et al., 2019) is: 48.3%    Assessment & Plan:   #1 recent purpuric contact dermatitis.  Rash started improving dramatically following Depo-Medrol .  No further intervention needed at this time.  She is encouraged to get back to her usual exercise activities  #2 type 2 diabetes.  Recently controlled with A1c 6.4% a month ago.  Continue weight loss efforts and lower glycemic diet.  Recheck A1c at  47-month follow-up  #3 hypertension currently stable.  She is done a great job reducing sodium intake and losing weight which has helped  #4 hyperlipidemia.  Goal LDL less than 55 with her history of CAD.  Currently on Praluent  and Crestor .  Continue low saturated fat diet. Wolm Scarlet, MD

## 2024-05-25 DIAGNOSIS — H04123 Dry eye syndrome of bilateral lacrimal glands: Secondary | ICD-10-CM | POA: Diagnosis not present

## 2024-05-25 DIAGNOSIS — Z961 Presence of intraocular lens: Secondary | ICD-10-CM | POA: Diagnosis not present

## 2024-06-15 ENCOUNTER — Other Ambulatory Visit (HOSPITAL_COMMUNITY): Payer: Self-pay

## 2024-06-15 ENCOUNTER — Other Ambulatory Visit (HOSPITAL_BASED_OUTPATIENT_CLINIC_OR_DEPARTMENT_OTHER): Payer: Self-pay

## 2024-06-23 ENCOUNTER — Telehealth: Payer: Self-pay | Admitting: Physician Assistant

## 2024-06-23 NOTE — Telephone Encounter (Signed)
 Spoke to patient she stated she is having trouble sleeping at night since she had coronary stents this past April.Advised she will need to call PCP.She will keep appointment with Dayna Dunn NP 9/10 at 9:15 am.

## 2024-06-23 NOTE — Telephone Encounter (Signed)
 Pt c/o medication issue:  1. Name of Medication:   ticagrelor  (BRILINTA ) 90 MG TABS tablet    2. How are you currently taking this medication (dosage and times per day)?  As prescribed  3. Are you having a reaction (difficulty breathing--STAT)?   4. What is your medication issue?   Patient's main concern is that she isn't getting much sleep during the night--last night she barely slept at all.   She mentions that she bruises easily and questions whether Brilinta  dose may need to be changed.

## 2024-07-04 ENCOUNTER — Ambulatory Visit: Admitting: Family Medicine

## 2024-07-12 ENCOUNTER — Other Ambulatory Visit (HOSPITAL_BASED_OUTPATIENT_CLINIC_OR_DEPARTMENT_OTHER): Payer: Self-pay

## 2024-07-12 ENCOUNTER — Other Ambulatory Visit: Payer: Self-pay | Admitting: Cardiology

## 2024-07-13 ENCOUNTER — Other Ambulatory Visit (HOSPITAL_BASED_OUTPATIENT_CLINIC_OR_DEPARTMENT_OTHER): Payer: Self-pay

## 2024-07-13 MED ORDER — METOPROLOL SUCCINATE ER 25 MG PO TB24
25.0000 mg | ORAL_TABLET | Freq: Every day | ORAL | 0 refills | Status: DC
  Filled 2024-07-13: qty 90, 90d supply, fill #0

## 2024-07-13 MED ORDER — ROSUVASTATIN CALCIUM 10 MG PO TABS
10.0000 mg | ORAL_TABLET | Freq: Every day | ORAL | 0 refills | Status: DC
  Filled 2024-07-13: qty 90, 90d supply, fill #0

## 2024-07-14 ENCOUNTER — Other Ambulatory Visit (HOSPITAL_COMMUNITY): Payer: Self-pay

## 2024-07-14 ENCOUNTER — Other Ambulatory Visit (HOSPITAL_BASED_OUTPATIENT_CLINIC_OR_DEPARTMENT_OTHER): Payer: Self-pay

## 2024-07-14 ENCOUNTER — Other Ambulatory Visit: Payer: Self-pay

## 2024-07-25 ENCOUNTER — Other Ambulatory Visit (HOSPITAL_COMMUNITY): Payer: Self-pay

## 2024-07-25 NOTE — Progress Notes (Unsigned)
   Cardiology Office Note    Date:  07/25/2024  ID:  [name], [name] 1945-12-27, MRN [id number] PCP:  Micheal Wolm ORN, MD  Cardiologist:  Debby Sor, MD (Inactive)  Electrophysiologist:  None   Chief Complaint: ***  History of Present Illness: .    Shelly [name] is a 78 y.o. female with visit-pertinent history of with CAD with mild NSTEMI s/p DES x 2 to RCA, hypertension, prior cough with lisinopril , hyperlipidemia, type 2 diabetes, GERD, TMJ, breast cancer, former tobacco use seen for follow-up. She was admitted 01/2024 with jaw pain that felt different than prior TMJ, with troponin elevation to 72. Underwent cath s/p DES x2 to RCA, otherwise residual 20% prox LAD disease. Echo showed EF 60-65%, trivial MR otherwise unrevealing. She has had prior intolerances to several medications including amlodipine , losartan , atorvastatin , rosuvastatin . She was amenable to re-trial of rosuvastatin  and later required referral to lipid clinic in 03/2024 for consideration of PCSK9i. Of note she has hx of ED visit 11/2018 for transient blurry vision, felt less likely TIA. CT head has not shown any stroke.  TK saw in 01/2024 otherwise only saw sheng Consider change to effient not sleeping well ??? Transient vision change in 2019 Consider TSH Rechecking lipids New doc Consider caroitd  CAD Hyperlipidemia Essential HTN History of transient blurry vision   Labwork independently reviewed: 03/2024 CBC wnl, K 4.6, Cr 1.03, LFTs ok, LDL 65, trig 145  ROS: .    Please see the history of present illness. Otherwise, review of systems is positive for ***.  All other systems are reviewed and otherwise negative.  Studies Reviewed: SABRA    EKG:  EKG is ordered today, personally reviewed, demonstrating ***  CV Studies: Cardiac studies reviewed are outlined and summarized above. Otherwise please see EMR for full report.   Current Reported Medications:.    No outpatient medications have been marked  as taking for the 07/26/24 encounter (Appointment) with Malissa Slay N, PA-C.    Physical Exam:    VS:  There were no vitals taken for this visit.   Wt Readings from Last 3 Encounters:  05/23/24 183 lb 8 oz (83.2 kg)  05/17/24 182 lb 9.6 oz (82.8 kg)  05/16/24 183 lb (83 kg)    GEN: Well nourished, well developed in no acute distress NECK: No JVD; No carotid bruits CARDIAC: ***RRR, no murmurs, rubs, gallops RESPIRATORY:  Clear to auscultation without rales, wheezing or rhonchi  ABDOMEN: Soft, non-tender, non-distended EXTREMITIES:  No edema; No acute deformity   Asessement and Plan:.     ***     Disposition: F/u with ***  Signed, Chris Cripps N Rachell Druckenmiller, PA-C

## 2024-07-26 ENCOUNTER — Ambulatory Visit: Attending: Cardiovascular Disease | Admitting: Physician Assistant

## 2024-07-26 ENCOUNTER — Ambulatory Visit (INDEPENDENT_AMBULATORY_CARE_PROVIDER_SITE_OTHER)

## 2024-07-26 ENCOUNTER — Encounter: Payer: Self-pay | Admitting: Physician Assistant

## 2024-07-26 VITALS — BP 130/70 | HR 56 | Resp 16 | Ht 63.0 in | Wt 184.2 lb

## 2024-07-26 DIAGNOSIS — R42 Dizziness and giddiness: Secondary | ICD-10-CM | POA: Diagnosis not present

## 2024-07-26 DIAGNOSIS — Z8669 Personal history of other diseases of the nervous system and sense organs: Secondary | ICD-10-CM | POA: Diagnosis not present

## 2024-07-26 DIAGNOSIS — E785 Hyperlipidemia, unspecified: Secondary | ICD-10-CM | POA: Diagnosis not present

## 2024-07-26 DIAGNOSIS — I2510 Atherosclerotic heart disease of native coronary artery without angina pectoris: Secondary | ICD-10-CM | POA: Insufficient documentation

## 2024-07-26 DIAGNOSIS — G479 Sleep disorder, unspecified: Secondary | ICD-10-CM | POA: Insufficient documentation

## 2024-07-26 DIAGNOSIS — I10 Essential (primary) hypertension: Secondary | ICD-10-CM | POA: Diagnosis not present

## 2024-07-26 NOTE — Progress Notes (Unsigned)
 Applied a 7 day Zio XT monitor to patient in the office  Loni to read

## 2024-07-26 NOTE — Patient Instructions (Addendum)
 Medication Instructions:  Your physician recommends that you continue on your current medications as directed. Please refer to the Current Medication list given to you today.  *If you need a refill on your cardiac medications before your next appointment, please call your pharmacy*  Lab Work: FASTING- CMET, LIPIDs, CBC, TSH at your earliest convenience  If you have labs (blood work) drawn today and your tests are completely normal, you will receive your results only by: MyChart Message (if you have MyChart) OR A paper copy in the mail If you have any lab test that is abnormal or we need to change your treatment, we will call you to review the results.  Testing/Procedures: Your physician has requested that you have a carotid duplex. This test is an ultrasound of the carotid arteries in your neck. It looks at blood flow through these arteries that supply the brain with blood. Allow one hour for this exam. There are no restrictions or special instructions.   ZIO XT- Long Term Monitor Instructions  Your physician has requested you wear a ZIO patch monitor for 7 days.  This is a single patch monitor. [employer] supplies one patch monitor per enrollment. Additional stickers are not available. Please do not apply patch if you will be having a Nuclear Stress Test,  Echocardiogram, Cardiac CT, MRI, or Chest Xray during the period you would be wearing the  monitor. The patch cannot be worn during these tests. You cannot remove and re-apply the  ZIO XT patch monitor.  Your ZIO patch monitor will be mailed 3 day USPS to your address on file. It may take 3-5 days  to receive your monitor after you have been enrolled.  Once you have received your monitor, please review the enclosed instructions. Your monitor  has already been registered assigning a specific monitor serial # to you.  Billing and Patient Assistance Program Information  We have supplied [employer] with any of your insurance information on  file for billing purposes. [employer] offers a sliding scale Patient Assistance Program for patients that do not have  insurance, or whose insurance does not completely cover the cost of the ZIO monitor.  You must apply for the Patient Assistance Program to qualify for this discounted rate.  To apply, please call [employer] at [phone], select option 4, select option 2, ask to apply for  Patient Assistance Program. Meredeth will ask your household income, and how many people  are in your household. They will quote your out-of-pocket cost based on that information.  [employer] will also be able to set up a 58-month, interest-free payment plan if needed.  When you are ready to remove the patch, follow instructions on the last 2 pages of Patient  Logbook. Stick patch monitor onto the last page of Patient Logbook.  Place Patient Logbook in the blue and white box. Use locking tab on box and tape box closed  securely. The blue and white box has prepaid postage on it. Please place it in the mailbox as  soon as possible. Your physician should have your test results approximately 7 days after the  monitor has been mailed back to [facility].  Call [facility] Customer Care at [phone] if you have questions regarding  your ZIO XT patch monitor. Call them immediately if you see an orange light blinking on your  monitor.  If your monitor falls off in less than 4 days, contact our Monitor department at [phone].  If your monitor becomes loose or falls off after 4  days call [employer] at [phone] for  suggestions on securing your monitor    Follow-Up: At [facility], you and your health needs are our priority.  As part of our continuing mission to provide you with exceptional heart care, our providers are all part of one team.  This team includes your primary Cardiologist (physician) and Advanced Practice Providers or APPs (Physician Assistants and Nurse Practitioners) who all work  together to provide you with the care you need, when you need it.  Your next appointment:   6 month(s)  Provider:   Gayatri A Acharya, MD

## 2024-07-27 ENCOUNTER — Telehealth: Payer: Self-pay | Admitting: Internal Medicine

## 2024-07-27 NOTE — Telephone Encounter (Signed)
 Spoke with the patient who states that when she woke up this morning she felt off. She states that she was unable to focus her eyes and felt fuzzy. She also reports that she had some low blood pressure readings 108/46 and 117/50 with heart rate of 60. She states that she took a nap and when she woke up and she felt better. She states that vision is no longer fuzzy. She was concerned that the heart monitor that was put on her yesterday might be causing her symptoms. Advised that I did not think this was the case. Advised patient to stay hydrated and change positions slowly with history of dizziness and blurry vision. She reports that skin around the heart monitor looks fine, may be slightly red but no itchiness. Advised patient to continue to monitor things.

## 2024-07-27 NOTE — Telephone Encounter (Signed)
 Pt is calling concerned about feeling lightheaded and having blurred vision today after being fitted with a heart monitor at yesterdays appt. BP was 108/46 60.   Pt c/o BP issue: STAT if pt c/o blurred vision, one-sided weakness or slurred speech.  STAT if BP is GREATER than 180/120 TODAY.  STAT if BP is LESS than 90/60 and SYMPTOMATIC TODAY  1. What is your BP concern? Low blood pressure   2. Have you taken any BP medication today? Yes  3. What are your last 5 BP readings? 108/46. HR 60  4. Are you having any other symptoms (ex. Dizziness, headache, blurred vision, passed out)? Dizziness, blurred vision

## 2024-07-31 ENCOUNTER — Other Ambulatory Visit: Payer: Self-pay

## 2024-07-31 DIAGNOSIS — I2510 Atherosclerotic heart disease of native coronary artery without angina pectoris: Secondary | ICD-10-CM | POA: Diagnosis not present

## 2024-07-31 DIAGNOSIS — I10 Essential (primary) hypertension: Secondary | ICD-10-CM | POA: Diagnosis not present

## 2024-07-31 DIAGNOSIS — E785 Hyperlipidemia, unspecified: Secondary | ICD-10-CM | POA: Diagnosis not present

## 2024-07-31 DIAGNOSIS — M818 Other osteoporosis without current pathological fracture: Secondary | ICD-10-CM

## 2024-07-31 DIAGNOSIS — R42 Dizziness and giddiness: Secondary | ICD-10-CM | POA: Diagnosis not present

## 2024-07-31 DIAGNOSIS — Z8669 Personal history of other diseases of the nervous system and sense organs: Secondary | ICD-10-CM | POA: Diagnosis not present

## 2024-07-31 DIAGNOSIS — G479 Sleep disorder, unspecified: Secondary | ICD-10-CM | POA: Diagnosis not present

## 2024-07-31 MED ORDER — DENOSUMAB 60 MG/ML ~~LOC~~ SOSY
60.0000 mg | PREFILLED_SYRINGE | SUBCUTANEOUS | Status: AC
  Administered 2024-09-07: 60 mg via SUBCUTANEOUS

## 2024-07-31 NOTE — Progress Notes (Signed)
 Pt on Bone Density Report. Order placed for PA.

## 2024-08-01 ENCOUNTER — Telehealth: Payer: Self-pay

## 2024-08-01 ENCOUNTER — Ambulatory Visit: Payer: Self-pay | Admitting: Physician Assistant

## 2024-08-01 ENCOUNTER — Telehealth: Payer: Self-pay | Admitting: Family Medicine

## 2024-08-01 LAB — COMPREHENSIVE METABOLIC PANEL WITH GFR
ALT: 17 IU/L (ref 0–32)
AST: 14 IU/L (ref 0–40)
Albumin: 4.4 g/dL (ref 3.8–4.8)
Alkaline Phosphatase: 82 IU/L (ref 51–125)
BUN/Creatinine Ratio: 19 (ref 12–28)
BUN: 17 mg/dL (ref 8–27)
Bilirubin Total: 0.4 mg/dL (ref 0.0–1.2)
CO2: 21 mmol/L (ref 20–29)
Calcium: 9.3 mg/dL (ref 8.7–10.3)
Chloride: 105 mmol/L (ref 96–106)
Creatinine, Ser: 0.89 mg/dL (ref 0.57–1.00)
Globulin, Total: 2 g/dL (ref 1.5–4.5)
Glucose: 119 mg/dL — ABNORMAL HIGH (ref 70–99)
Potassium: 5.2 mmol/L (ref 3.5–5.2)
Sodium: 143 mmol/L (ref 134–144)
Total Protein: 6.4 g/dL (ref 6.0–8.5)
eGFR: 66 mL/min/1.73 (ref >59)

## 2024-08-01 LAB — CBC
Hematocrit: 42.7 % (ref 34.0–46.6)
Hemoglobin: 13.7 g/dL (ref 11.1–15.9)
MCH: 29.5 pg (ref 26.6–33.0)
MCHC: 32.1 g/dL (ref 31.5–35.7)
MCV: 92 fL (ref 79–97)
Platelets: 255 x10E3/uL (ref 150–450)
RBC: 4.65 x10E6/uL (ref 3.77–5.28)
RDW: 14.4 % (ref 11.7–15.4)
WBC: 6.1 x10E3/uL (ref 3.4–10.8)

## 2024-08-01 LAB — LIPID PANEL
Chol/HDL Ratio: 1.8 ratio (ref 0.0–4.4)
Cholesterol, Total: 98 mg/dL — ABNORMAL LOW (ref 100–199)
HDL: 55 mg/dL (ref >39)
LDL Chol Calc (NIH): 23 mg/dL (ref 0–99)
Triglycerides: 106 mg/dL (ref 0–149)
VLDL Cholesterol Cal: 20 mg/dL (ref 5–40)

## 2024-08-01 LAB — TSH: TSH: 1.53 u[IU]/mL (ref 0.450–4.500)

## 2024-08-01 NOTE — Telephone Encounter (Signed)
 Prolia  VOB initiated via [email or web address]  Next Prolia  inj DUE: 09/06/24

## 2024-08-01 NOTE — Telephone Encounter (Unsigned)
 Copied from CRM [id number]. Topic: Appointments - Scheduling Inquiry for Clinic >> Aug 01, 2024  8:22 AM Thersia C wrote: Reason for CRM: Patient called in regarding her prolia  shot, would like to be called to be scheduled so she can be approved by her insurance prior

## 2024-08-01 NOTE — Telephone Encounter (Deleted)
 Shelly [name]

## 2024-08-02 ENCOUNTER — Other Ambulatory Visit (HOSPITAL_COMMUNITY): Payer: Self-pay

## 2024-08-02 DIAGNOSIS — Z23 Encounter for immunization: Secondary | ICD-10-CM | POA: Diagnosis not present

## 2024-08-02 NOTE — Telephone Encounter (Signed)
 Pt ready for scheduling for PROLIA  on or after : 09/06/24  Option# 1: Buy/Bill (Office supplied medication)  Out-of-pocket cost due at time of clinic visit: $0  Number of injection/visits approved: ---  Primary: MEDICARE Prolia  co-insurance: 0% Admin fee co-insurance: 0%  Secondary: UHC-MEDSUP Prolia  co-insurance:  Admin fee co-insurance:   Medical Benefit Details: Date Benefits were checked: 08/01/24 Deductible: $257 Met of $257 Required/ Coinsurance: 0%/ Admin Fee: 0%  Prior Auth: N/A PA# Expiration Date:   # of doses approved: ----------------------------------------------------------------------- Option# 2- Med Obtained from pharmacy:  Pharmacy benefit: Copay $149.63 (Paid to pharmacy) Admin Fee: 0% (Pay at clinic)  Prior Auth: N/A PA# Expiration Date:   # of doses approved:   If patient wants fill through the pharmacy benefit please send prescription to: [facility], and include estimated need by date in rx notes. Pharmacy will ship medication directly to the office.  Patient NOT eligible for Prolia  Copay Card. Copay Card can make patient's cost as little as $25. Link to apply: [email or web address]://[email or web address]  ** This summary of benefits is an estimation of the patient's out-of-pocket cost. Exact cost may very based on individual plan coverage.

## 2024-08-02 NOTE — Telephone Encounter (Signed)
 Shelly [name]

## 2024-08-02 NOTE — Telephone Encounter (Signed)
 Attempted to reach patient by phone. No answer. Left voicemail for return call.

## 2024-08-03 NOTE — Telephone Encounter (Signed)
 Appointment scheduled. Patient returned call.

## 2024-08-09 ENCOUNTER — Ambulatory Visit (HOSPITAL_COMMUNITY)
Admission: RE | Admit: 2024-08-09 | Discharge: 2024-08-09 | Disposition: A | Discharge status: Home / Self Care | Source: Ambulatory Visit | Attending: Physician Assistant | Admitting: Physician Assistant

## 2024-08-09 DIAGNOSIS — R42 Dizziness and giddiness: Secondary | ICD-10-CM | POA: Diagnosis not present

## 2024-08-16 ENCOUNTER — Other Ambulatory Visit (HOSPITAL_BASED_OUTPATIENT_CLINIC_OR_DEPARTMENT_OTHER): Payer: Self-pay

## 2024-08-16 DIAGNOSIS — R42 Dizziness and giddiness: Secondary | ICD-10-CM | POA: Diagnosis not present

## 2024-08-18 DIAGNOSIS — R42 Dizziness and giddiness: Secondary | ICD-10-CM | POA: Diagnosis not present

## 2024-08-21 ENCOUNTER — Ambulatory Visit: Payer: Self-pay

## 2024-08-21 NOTE — Telephone Encounter (Signed)
 FYI Only or Action Required?: FYI only for provider.  Patient was last seen in primary care on 05/23/2024 by Micheal Wolm ORN, MD.  Called Nurse Triage reporting Knee Pain.  Symptoms began a week ago.  Interventions attempted: Rest, hydration, or home remedies.  Symptoms are: unchanged.  Triage Disposition: See PCP When Office is Open (Within 3 Days)  Patient/caregiver understands and will follow disposition?: Yes  Copied from CRM #[id number]. Topic: Clinical - Medical Advice >> Aug 21, 2024 10:18 AM Terri MATSU wrote: Reason for CRM: Patient has an appointment for the 23 of Oct to get her shot in her knee but she stated it's been in pain since she came back from vacation. So she wants some advice on what to do with her knee till her appointment. Callback number [phone] Reason for Disposition  [1] MODERATE pain (e.g., interferes with normal activities, limping) AND [2] present > 3 days  Answer Assessment - Initial Assessment Questions 1. LOCATION and RADIATION: Where is the pain located?  Left - anterior, posterior, and medial knee pain  3. SEVERITY: How bad is the pain? What does it keep you from doing?   (Scale 1-10; or mild, moderate, severe)     *No Answer* 4. ONSET: When did the pain start? Does it come and go, or is it there all the time?     One week ago 5. RECURRENT: Have you had this pain before? If Yes, ask: When, and what happened then?     History of knee pain 6. SETTING: Has there been any recent work, exercise or other activity that involved that part of the body?      On vacation, up on her feet a lot 7. AGGRAVATING FACTORS: What makes the knee pain worse? (e.g., walking, climbing stairs, running)     walking 8. ASSOCIATED SYMPTOMS: Is there any swelling or redness of the knee?     Mild swelling 9. OTHER SYMPTOMS: Do you have any other symptoms? (e.g., calf pain, chest pain, difficulty breathing, fever)     denies 10. PREGNANCY: Is there  any chance you are pregnant? When was your last menstrual period?       N/a  Protocols used: Knee Pain-A-AH

## 2024-08-23 ENCOUNTER — Ambulatory Visit: Admitting: Family Medicine

## 2024-08-23 ENCOUNTER — Encounter: Payer: Self-pay | Admitting: Family Medicine

## 2024-08-23 VITALS — BP 122/64 | HR 68 | Temp 98.0°F | Wt 185.3 lb

## 2024-08-23 DIAGNOSIS — S86912A Strain of unspecified muscle(s) and tendon(s) at lower leg level, left leg, initial encounter: Secondary | ICD-10-CM | POA: Diagnosis not present

## 2024-08-23 NOTE — Progress Notes (Signed)
 Established Patient Office Visit  Subjective   Patient ID: Shelly [name], female    DOB: Jan 29, 1946  Age: 78 y.o. MRN: [id number]  Chief Complaint  Patient presents with   Knee Injury    HPI   Shelly [name] is seen with left knee pain.  She was [place] in the [place] with her [place] about a week ago.  She did hike about a mile 1 day and the next day noticed some soreness in the knee.  Does not recall any specific injury.  Location is medial left knee.  No locking or giving way.  She is taken some Tylenol  and feels like her pain is slightly improved over the past few days.  The day after they get back from her vacation she went to the gym and tried to exercise with walking and cycling and feels like that may have exacerbated her pain.  She has not noticed any warmth or erythema.  No visible bruising.  Avoids non-steroidals  She has cardiac history and followed closely by cardiology.  Had recent carotid Dopplers which were unremarkable.  Recent event monitor which showed no significant arrhythmias.  She remains on Brilinta , rosuvastatin , metoprolol , Praluent .  Lipids have been well-controlled.  Denies any recent chest pain.  Past Medical History:  Diagnosis Date   Allergy    Arthritis    wrist, shoulder    Cancer (HCC) 08/2019   left breast IDC   Diverticulitis    Endometrial polyp    Family history of breast cancer    GERD (gastroesophageal reflux disease)    occasional - diet controlled   Hx of adenomatous colonic polyps    Hyperlipidemia    IBS (irritable bowel syndrome)    Missed abortion    x 1 - resolved - no surgery   Osteoporosis    SVD (spontaneous vaginal delivery)    x 1   Transient vision disturbance 2019   was evaluated by ED, no findings   Vitamin D  deficiency    Past Surgical History:  Procedure Laterality Date   BREAST EXCISIONAL BIOPSY Right 08/2019   Benign, lesion    BREAST LUMPECTOMY Left 09/2019   BREAST LUMPECTOMY WITH RADIOACTIVE SEED AND SENTINEL  LYMPH NODE BIOPSY Left 10/06/2019   Procedure: LEFT BREAST LUMPECTOMY  X2 WITH RADIOACTIVE SEED X2  AND SENTINEL LYMPH NODE BIOPSY;  Surgeon: Curvin Deward MOULD, MD;  Location: [facility];  Service: General;  Laterality: Left;   BREAST LUMPECTOMY WITH RADIOACTIVE SEED LOCALIZATION Right 10/06/2019   Procedure: RIGHT BREAST LUMPECTOMY WITH RADIOACTIVE SEED LOCALIZATION;  Surgeon: Curvin Deward MOULD, MD;  Location: [facility];  Service: General;  Laterality: Right;   BRONCHIAL BIOPSY  12/27/2020   Procedure: BRONCHIAL BIOPSIES;  Surgeon: Brenna Adine CROME, DO;  Location: [facility];  Service: Pulmonary;;   BRONCHIAL BRUSHINGS  12/27/2020   Procedure: BRONCHIAL BRUSHINGS;  Surgeon: Brenna Adine CROME, DO;  Location: [facility];  Service: Pulmonary;;   BRONCHIAL NEEDLE ASPIRATION BIOPSY  12/27/2020   Procedure: BRONCHIAL NEEDLE ASPIRATION BIOPSIES;  Surgeon: Brenna Adine CROME, DO;  Location: [facility];  Service: Pulmonary;;   BRONCHIAL WASHINGS  12/27/2020   Procedure: BRONCHIAL WASHINGS;  Surgeon: Brenna Adine CROME, DO;  Location: [facility];  Service: Pulmonary;;   COLONOSCOPY     CORONARY ANGIOGRAPHY N/A 01/18/2024   Procedure: CORONARY ANGIOGRAPHY;  Surgeon: Wendel Lurena POUR, MD;  Location: [facility];  Service: Cardiovascular;  Laterality: N/A;   CORONARY IMAGING/OCT N/A 01/18/2024  Procedure: CORONARY IMAGING/OCT;  Surgeon: Wendel Lurena POUR, MD;  Location: [facility];  Service: Cardiovascular;  Laterality: N/A;   CORONARY STENT INTERVENTION N/A 01/18/2024   Procedure: CORONARY STENT INTERVENTION;  Surgeon: Wendel Lurena POUR, MD;  Location: [facility];  Service: Cardiovascular;  Laterality: N/A;   DIAGNOSTIC LAPAROSCOPY     cysts   DILATATION & CURETTAGE/HYSTEROSCOPY WITH TRUECLEAR N/A 10/31/2013   Procedure: DILATATION & CURETTAGE/HYSTEROSCOPY WITH TRUCLEAR;  Surgeon: Truman Corona, MD;  Location: [facility];  Service: Gynecology;  Laterality: N/A;    DILATION AND CURETTAGE OF UTERUS     MASS EXCISION Left 11/06/2019   Procedure: EXCISION NIPPLE AND AREOLA  MARGIN LEFT BREAST;  Surgeon: Curvin Deward MOULD, MD;  Location: [facility];  Service: General;  Laterality: Left;   TONSILLECTOMY  1952   VIDEO BRONCHOSCOPY WITH ENDOBRONCHIAL NAVIGATION Bilateral 12/27/2020   Procedure: VIDEO BRONCHOSCOPY WITH ENDOBRONCHIAL NAVIGATION;  Surgeon: Brenna Adine CROME, DO;  Location: [facility];  Service: Pulmonary;  Laterality: Bilateral;   VIDEO BRONCHOSCOPY WITH ENDOBRONCHIAL ULTRASOUND Bilateral 12/27/2020   Procedure: VIDEO BRONCHOSCOPY WITH ENDOBRONCHIAL ULTRASOUND;  Surgeon: Brenna Adine CROME, DO;  Location: [facility];  Service: Pulmonary;  Laterality: Bilateral;   WISDOM TOOTH EXTRACTION      reports that she quit smoking about 17 years ago. Her smoking use included cigarettes. She started smoking about 37 years ago. She has a 2 pack-year smoking history. She has never used smokeless tobacco. She reports current alcohol use of about 14.0 standard drinks of alcohol per week. She reports that she does not use drugs. family history includes Brain cancer in her paternal uncle; Breast cancer (age of onset: 52) in her cousin; Breast cancer (age of onset: 68) in her maternal aunt and maternal aunt; Cancer in her maternal grandfather, maternal uncle, and another family member; Colon polyps in her mother; Depression in her brother; Diabetes in her mother; Heart disease in an other family member; Hyperlipidemia in her father and mother; Hypertension in her mother and another family member; Leukemia (age of onset: 31) in her mother; Lung cancer in her father and maternal grandfather; Throat cancer in her paternal uncle; Uterine cancer in her paternal grandmother. Allergies  Allergen Reactions   Taxotere  [Docetaxel ] Swelling   Abraxane  [Paclitaxel  Protein-Bound Part] Other (See Comments)    3 days after receiving she had one episode of very high BP and HR.  This happened again 2 days later during the night.    Amlodipine      Caused IBS flare-up. Returned after stopping and restarting med   Crestor  [Rosuvastatin  Calcium ] Itching    GI upset   Lipitor [Atorvastatin  Calcium ] Itching   Lisinopril  Cough   Losartan      Diffuse arthralgias and back pain. Returned after stopping and restarting med   Nsaids Other (See Comments)    GI upset   Pravastatin  Itching    GI upset   Sulfonamide Derivatives Rash    Hives, itiching    Review of Systems  Constitutional:  Negative for fever.  Cardiovascular:  Negative for chest pain.  Neurological:  Negative for focal weakness.      Objective:     BP 122/64 (BP Location: Left Arm, Cuff Size: Normal)   Pulse 68   Temp 98 F (36.7 C) (Oral)   Wt 185 lb 4.8 oz (84.1 kg)   SpO2 94%   BMI 32.82 kg/m  BP Readings from Last 3 Encounters:  08/23/24 122/64  07/26/24 130/70  05/23/24  124/60   Wt Readings from Last 3 Encounters:  08/23/24 185 lb 4.8 oz (84.1 kg)  07/26/24 184 lb 3.2 oz (83.6 kg)  05/23/24 183 lb 8 oz (83.2 kg)      Physical Exam Vitals reviewed.  Constitutional:      General: She is not in acute distress.    Appearance: She is not ill-appearing.  Cardiovascular:     Rate and Rhythm: Normal rate and regular rhythm.  Pulmonary:     Effort: Pulmonary effort is normal.     Breath sounds: Normal breath sounds.  Musculoskeletal:     Comments: Left knee reveals no visible swelling.  No effusion.  No warmth.  No ecchymosis.  No erythema.  Full range of motion.  Mild medial joint line tenderness.  No lateral tenderness.  Good ligament stability  Neurological:     Mental Status: She is alert.      No results found for any visits on 08/23/24.  Last CBC Lab Results  Component Value Date   WBC 6.1 07/31/2024   HGB 13.7 07/31/2024   HCT 42.7 07/31/2024   MCV 92 07/31/2024   MCH 29.5 07/31/2024   RDW 14.4 07/31/2024   PLT 255 07/31/2024   Last metabolic panel Lab Results   Component Value Date   GLUCOSE 119 (H) 07/31/2024   NA 143 07/31/2024   K 5.2 07/31/2024   CL 105 07/31/2024   CO2 21 07/31/2024   BUN 17 07/31/2024   CREATININE 0.89 07/31/2024   EGFR 66 07/31/2024   CALCIUM  9.3 07/31/2024   PROT 6.4 07/31/2024   ALBUMIN 4.4 07/31/2024   LABGLOB 2.0 07/31/2024   BILITOT 0.4 07/31/2024   ALKPHOS 82 07/31/2024   AST 14 07/31/2024   ALT 17 07/31/2024   ANIONGAP 6 03/23/2024   Last lipids Lab Results  Component Value Date   CHOL 98 (L) 07/31/2024   HDL 55 07/31/2024   LDLCALC 23 07/31/2024   LDLDIRECT 215.9 06/04/2011   TRIG 106 07/31/2024   CHOLHDL 1.8 07/31/2024      The ASCVD Risk score (Arnett DK, et al., 2019) failed to calculate for the following reasons:   The valid total cholesterol range is 130 to 320 mg/dL    Assessment & Plan:   Acute left knee pain.  Started about a week ago when she was over the mountains after doing some hiking but does not recall specific injury.  No effusion.  Pain gradually improving.  Suspect acute strain possibly meniscus.  Recommend continue conservative management with Tylenol  and heat if helps symptomatically.  Continue walking and cycling on low resistance as tolerated.  Be in touch for follow-up in 2 to 3 weeks if not continuing to improve.  Avoid nonsteroidals with her history of CAD and age  Wolm Scarlet, MD

## 2024-08-23 NOTE — Patient Instructions (Addendum)
 Continue with Tylenol  as needed  Continue with heat for symptom relief  May continue with walking and cycling (low resistance) as tolerated.   Let me know if knee not continuing to heal in 2- 3 weeks.

## 2024-08-24 DIAGNOSIS — Z23 Encounter for immunization: Secondary | ICD-10-CM | POA: Diagnosis not present

## 2024-08-28 ENCOUNTER — Other Ambulatory Visit: Payer: Self-pay

## 2024-08-28 ENCOUNTER — Ambulatory Visit (INDEPENDENT_AMBULATORY_CARE_PROVIDER_SITE_OTHER)

## 2024-08-28 ENCOUNTER — Ambulatory Visit
Admission: RE | Admit: 2024-08-28 | Discharge: 2024-08-28 | Disposition: A | Discharge status: Home / Self Care | Source: Ambulatory Visit | Attending: Nurse Practitioner | Admitting: Nurse Practitioner

## 2024-08-28 VITALS — BP 122/62 | HR 67 | Temp 98.2°F | Ht 63.0 in | Wt 185.7 lb

## 2024-08-28 DIAGNOSIS — Z1231 Encounter for screening mammogram for malignant neoplasm of breast: Secondary | ICD-10-CM | POA: Diagnosis not present

## 2024-08-28 DIAGNOSIS — Z0000 Encounter for general adult medical examination without abnormal findings: Secondary | ICD-10-CM | POA: Diagnosis not present

## 2024-08-28 DIAGNOSIS — Z170 Estrogen receptor positive status [ER+]: Secondary | ICD-10-CM

## 2024-08-28 NOTE — Progress Notes (Signed)
 Subjective:   Shelly [name] is a 78 y.o. who presents for a Medicare Wellness preventive visit.  As a reminder, Annual Wellness Visits don't include a physical exam, and some assessments may be limited, especially if this visit is performed virtually. We may recommend an in-person follow-up visit with your provider if needed.  Visit Complete: In person    Persons Participating in Visit: Patient.  AWV Questionnaire: No: Patient Medicare AWV questionnaire was not completed prior to this visit.  Cardiac Risk Factors include: advanced age (>19men, >17 women);diabetes mellitus;hypertension     Objective:    Today's Vitals   08/28/24 1501  BP: 122/62  Pulse: 67  Temp: 98.2 F (36.8 C)  TempSrc: Oral  SpO2: 98%  Weight: 185 lb 11.2 oz (84.2 kg)  Height: 5' 3 (1.6 m)   Body mass index is 32.9 kg/m.     08/28/2024    3:18 PM 05/16/2024   10:06 PM 05/15/2024    9:15 PM 05/15/2024    1:52 PM 01/17/2024    1:34 PM 08/23/2023    3:19 PM 01/26/2023    8:12 AM  Advanced Directives  Does Patient Have a Medical Advance Directive? Yes Yes Yes Yes Yes Yes Yes  Type of Estate agent of [place];Living will Healthcare Power of [place];Living will Healthcare Power of [place];Living will Healthcare Power of [place];Living will Healthcare Power of [place];Living will Healthcare Power of [place];Living will Healthcare Power of [place];Living will  Does patient want to make changes to medical advance directive?     Yes (Inpatient - patient defers changing a medical advance directive and declines information at this time)  No - Patient declined  Copy of Healthcare Power of Attorney in Chart? No - copy requested    No - copy requested No - copy requested No - copy requested    Current Medications (verified) Outpatient Encounter Medications as of 08/28/2024  Medication Sig   acetaminophen  (TYLENOL ) 500 MG tablet Take 500 mg by mouth every 6 (six) hours as needed for  headache or mild pain.   Alirocumab  (PRALUENT ) 150 MG/ML SOAJ Inject 1 mL (150 mg total) into the skin every 14 (fourteen) days.   aspirin  EC 81 MG tablet Take 1 tablet (81 mg total) by mouth daily. Swallow whole.   clidinium-chlordiazePOXIDE  (LIBRAX ) 5-2.5 MG capsule TAKE 1 CAPSULE EVERY DAY AS NEEDED   denosumab  (PROLIA ) 60 MG/ML SOSY injection Inject 60 mg into the skin every 6 (six) months. Last inj was in October   loratadine (CLARITIN) 10 MG tablet Take 10 mg by mouth daily.   metoprolol  succinate (TOPROL -XL) 25 MG 24 hr tablet Take 1 tablet (25 mg total) by mouth daily. Please keep upcoming appointment in September 2025 for future refills. Thank you (Patient not taking: Reported on 08/28/2024)   nitroGLYCERIN  (NITROSTAT ) 0.4 MG SL tablet Place 1 tablet (0.4 mg total) under the tongue every 5 (five) minutes x 3 doses as needed for chest pain.   rosuvastatin  (CRESTOR ) 10 MG tablet Take 1 tablet (10 mg total) by mouth daily. Please keep upcoming appointment in September 2025 for future refills. Thank you   ticagrelor  (BRILINTA ) 90 MG TABS tablet Take 1 tablet (90 mg total) by mouth 2 (two) times daily.   [DISCONTINUED] omeprazole (PRILOSEC) 40 MG capsule Take 40 mg by mouth daily.   [DISCONTINUED] pravastatin  (PRAVACHOL ) 40 MG tablet Take 1 tablet (40 mg total) by mouth every evening.   [DISCONTINUED] prochlorperazine  (COMPAZINE ) 10 MG tablet Take 1 tablet (  10 mg total) by mouth every 6 (six) hours as needed (Nausea or vomiting).   Facility-Administered Encounter Medications as of 08/28/2024  Medication   [START ON 09/07/2024] denosumab  (PROLIA ) injection 60 mg    Allergies (verified) Taxotere  [docetaxel ], Abraxane  [paclitaxel  protein-bound part], Amlodipine , Crestor  [rosuvastatin  calcium ], Lipitor [atorvastatin  calcium ], Lisinopril , Losartan , Nsaids, Pravastatin , and Sulfonamide derivatives   History: Past Medical History:  Diagnosis Date   Allergy    Arthritis    wrist, shoulder     Cancer (HCC) 08/2019   left breast IDC   Diverticulitis    Endometrial polyp    Family history of breast cancer    GERD (gastroesophageal reflux disease)    occasional - diet controlled   Hx of adenomatous colonic polyps    Hyperlipidemia    IBS (irritable bowel syndrome)    Missed abortion    x 1 - resolved - no surgery   Osteoporosis    SVD (spontaneous vaginal delivery)    x 1   Transient vision disturbance 2019   was evaluated by ED, no findings   Vitamin D  deficiency    Past Surgical History:  Procedure Laterality Date   BREAST EXCISIONAL BIOPSY Right 08/2019   Benign, lesion    BREAST LUMPECTOMY Left 09/2019   BREAST LUMPECTOMY WITH RADIOACTIVE SEED AND SENTINEL LYMPH NODE BIOPSY Left 10/06/2019   Procedure: LEFT BREAST LUMPECTOMY  X2 WITH RADIOACTIVE SEED X2  AND SENTINEL LYMPH NODE BIOPSY;  Surgeon: Curvin Deward MOULD, MD;  Location: [facility];  Service: General;  Laterality: Left;   BREAST LUMPECTOMY WITH RADIOACTIVE SEED LOCALIZATION Right 10/06/2019   Procedure: RIGHT BREAST LUMPECTOMY WITH RADIOACTIVE SEED LOCALIZATION;  Surgeon: Curvin Deward MOULD, MD;  Location: [facility];  Service: General;  Laterality: Right;   BRONCHIAL BIOPSY  12/27/2020   Procedure: BRONCHIAL BIOPSIES;  Surgeon: Brenna Adine CROME, DO;  Location: [facility];  Service: Pulmonary;;   BRONCHIAL BRUSHINGS  12/27/2020   Procedure: BRONCHIAL BRUSHINGS;  Surgeon: Brenna Adine CROME, DO;  Location: [facility];  Service: Pulmonary;;   BRONCHIAL NEEDLE ASPIRATION BIOPSY  12/27/2020   Procedure: BRONCHIAL NEEDLE ASPIRATION BIOPSIES;  Surgeon: Brenna Adine CROME, DO;  Location: [facility];  Service: Pulmonary;;   BRONCHIAL WASHINGS  12/27/2020   Procedure: BRONCHIAL WASHINGS;  Surgeon: Brenna Adine CROME, DO;  Location: [facility];  Service: Pulmonary;;   COLONOSCOPY     CORONARY ANGIOGRAPHY N/A 01/18/2024   Procedure: CORONARY ANGIOGRAPHY;  Surgeon: Wendel Lurena POUR, MD;  [facility];  Service: Cardiovascular;  Laterality: N/A;   CORONARY IMAGING/OCT N/A 01/18/2024   Procedure: CORONARY IMAGING/OCT;  Surgeon: Wendel Lurena POUR, MD;  [facility];  Service: Cardiovascular;  Laterality: N/A;   CORONARY STENT INTERVENTION N/A 01/18/2024   Procedure: CORONARY STENT INTERVENTION;  Surgeon: Wendel Lurena POUR, MD;  [facility];  Service: Cardiovascular;  Laterality: N/A;   DIAGNOSTIC LAPAROSCOPY     cysts   DILATATION & CURETTAGE/HYSTEROSCOPY WITH TRUECLEAR N/A 10/31/2013   Procedure: DILATATION & CURETTAGE/HYSTEROSCOPY WITH TRUCLEAR;  Surgeon: Truman Corona, MD;  Location: [facility];  Service: Gynecology;  Laterality: N/A;   DILATION AND CURETTAGE OF UTERUS     MASS EXCISION Left 11/06/2019   Procedure: EXCISION NIPPLE AND AREOLA  MARGIN LEFT BREAST;  Surgeon: Curvin Deward MOULD, MD;  Location: [facility];  Service: General;  Laterality: Left;   TONSILLECTOMY  1952   VIDEO BRONCHOSCOPY WITH ENDOBRONCHIAL NAVIGATION Bilateral 12/27/2020  Procedure: VIDEO BRONCHOSCOPY WITH ENDOBRONCHIAL NAVIGATION;  Surgeon: Brenna Adine CROME, DO;  Location: [facility];  Service: Pulmonary;  Laterality: Bilateral;   VIDEO BRONCHOSCOPY WITH ENDOBRONCHIAL ULTRASOUND Bilateral 12/27/2020   Procedure: VIDEO BRONCHOSCOPY WITH ENDOBRONCHIAL ULTRASOUND;  Surgeon: Brenna Adine CROME, DO;  Location: [facility];  Service: Pulmonary;  Laterality: Bilateral;   WISDOM TOOTH EXTRACTION     Family History  Problem Relation Age of Onset   Hyperlipidemia Mother    Hypertension Mother    Diabetes Mother        type ll   Leukemia Mother 73   Colon polyps Mother    Lung cancer Father    Hyperlipidemia Father    Breast cancer Cousin 30       maternal first couin   Heart disease Other    Hypertension Other    Cancer Other    Depression Brother    Breast cancer Maternal Aunt 30   Uterine cancer Paternal Grandmother    Breast cancer Maternal Aunt 50    Lung cancer Maternal Grandfather    Cancer Maternal Grandfather        gastric cancer?   Cancer Maternal Uncle        unknown   Brain cancer Paternal Uncle    Throat cancer Paternal Uncle    Colon cancer Neg Hx    Esophageal cancer Neg Hx    Stomach cancer Neg Hx    Rectal cancer Neg Hx    Social History   Socioeconomic History   Marital status: Married    Spouse name: Not on file   Number of children: 1   Years of education: Not on file   Highest education level: Not on file  Occupational History   Not on file  Tobacco Use   Smoking status: Former    Current packs/day: 0.00    Average packs/day: 0.1 packs/day for 20.0 years (2.0 ttl pk-yrs)    Types: Cigarettes    Start date: 05/15/1987    Quit date: 05/15/2007    Years since quitting: 17.3   Smokeless tobacco: Never  Vaping Use   Vaping status: Never Used  Substance and Sexual Activity   Alcohol use: Yes    Alcohol/week: 14.0 standard drinks of alcohol    Types: 14 Glasses of wine per week    Comment: 2 glasses wine every evening   Drug use: No   Sexual activity: Yes    Birth control/protection: Post-menopausal  Other Topics Concern   Not on file  Social History Narrative   Lives with husband in 2 story house   Has one daughter, with 3 grandchildren, local, supportive   Enjoys gardening/walking outside with husband   Occasionally does home exercise calisthenics but not recently.   Well-balanced diet overall   Social Drivers of Corporate investment banker Strain: Low Risk  (08/28/2024)   Overall Financial Resource Strain (CARDIA)    Difficulty of Paying Living Expenses: Not hard at all  Food Insecurity: No Food Insecurity (08/28/2024)   Hunger Vital Sign    Worried About Running Out of Food in the Last Year: Never true    Ran Out of Food in the Last Year: Never true  Transportation Needs: No Transportation Needs (08/28/2024)   PRAPARE - Administrator, Civil Service (Medical): No    Lack of  Transportation (Non-Medical): No  Physical Activity: Inactive (08/28/2024)   Exercise Vital Sign    Days of Exercise per Week: 0 days  Minutes of Exercise per Session: 0 min  Stress: No Stress Concern Present (08/28/2024)   [employer] of Occupational Health - Occupational Stress Questionnaire    Feeling of Stress: Not at all  Social Connections: Socially Integrated (08/28/2024)   Social Connection and Isolation Panel    Frequency of Communication with Friends and Family: More than three times a week    Frequency of Social Gatherings with Friends and Family: More than three times a week    Attends Religious Services: More than 4 times per year    Active Member of [employer] or Organizations: Yes    Attends Engineer, structural: More than 4 times per year    Marital Status: Married    Tobacco Counseling Counseling given: Not Answered    Clinical Intake:  Pre-visit preparation completed: Yes  Pain : No/denies pain     BMI - recorded: 32.9 Nutritional Status: BMI > 30  Obese Nutritional Risks: None Diabetes: Yes CBG done?: No Did pt. bring in CBG monitor from home?: No  Lab Results  Component Value Date   HGBA1C 6.4 (A) 04/24/2024   HGBA1C 6.7 (H) 01/18/2024   HGBA1C 6.6 (H) 08/14/2020     How often do you need to have someone help you when you read instructions, pamphlets, or other written materials from your doctor or pharmacy?: 1 - Never  Interpreter Needed?: No  Information entered by :: Rojelio Blush LPN   Activities of Daily Living     08/28/2024    3:17 PM 01/17/2024    1:34 PM  In your present state of health, do you have any difficulty performing the following activities:  Hearing? 0 0  Vision? 0 0  Difficulty concentrating or making decisions? 0 0  Walking or climbing stairs? 0   Dressing or bathing? 0   Doing errands, shopping? 0 1  Preparing Food and eating ? N   Using the Toilet? N   In the past six months, have you accidently  leaked urine? N   Do you have problems with loss of bowel control? N   Managing your Medications? N   Managing your Finances? N   Housekeeping or managing your Housekeeping? N     Patient Care Team: Micheal Wolm ORN, MD as PCP - General (Family Medicine) Loni Soyla LABOR, MD as PCP - Cardiology (Cardiology) Cleotilde Carlin Lenis, OD as Referring Physician (Optometry) Curvin Deward MOULD, MD as Consulting Physician (General Surgery) Tyree Nanetta SAILOR, RN as Oncology Nurse Navigator Lanny Callander, MD as Consulting Physician (Hematology) Burton, Lacie K, NP as Nurse Practitioner (Nurse Practitioner) Izell Domino, MD as Attending Physician (Radiation Oncology)  I have updated your Care Teams any recent Medical Services you may have received from other providers in the past year.     Assessment:   This is a routine wellness examination for Alyannah.  Hearing/Vision screen Hearing Screening - Comments:: Denies hearing difficulties     Goals Addressed               This Visit's Progress     Remain active (pt-stated)        Continue being mobile.       Depression Screen     08/28/2024    3:03 PM 04/28/2024    3:34 PM 02/01/2024   10:52 AM 08/23/2023    3:17 PM 11/24/2022   11:29 AM 08/17/2022    9:17 AM 01/12/2022    3:08 PM  PHQ 2/9 Scores  PHQ - 2  Score 0 0 0 0 0 0 0  PHQ- 9 Score  3 3        Fall Risk     08/28/2024    3:17 PM 04/28/2024    3:52 PM 04/26/2024    2:23 PM 04/21/2024    2:17 PM 04/19/2024    2:18 PM  Fall Risk   Falls in the past year? 0 0 0 0 0  Number falls in past yr: 0 0 0 0 0  Injury with Fall? 0 0 0 0 0  Risk for fall due to : No Fall Risks Impaired balance/gait;Orthopedic patient Impaired balance/gait;Orthopedic patient Impaired balance/gait;Orthopedic patient Impaired balance/gait;Orthopedic patient  Follow up Falls evaluation completed Falls evaluation completed Falls evaluation completed Falls evaluation completed Falls evaluation completed     MEDICARE RISK AT HOME:  Medicare Risk at Home Any stairs in or around the home?: Yes If so, are there any without handrails?: No Home free of loose throw rugs in walkways, pet beds, electrical cords, etc?: Yes Adequate lighting in your home to reduce risk of falls?: Yes Life alert?: No Use of a cane, walker or w/c?: No Grab bars in the bathroom?: Yes Shower chair or bench in shower?: Yes Elevated toilet seat or a handicapped toilet?: Yes  TIMED UP AND GO:  Was the test performed?  Yes  Length of time to ambulate 10 feet: 10 sec Gait steady and fast without use of assistive device  Cognitive Function: 6CIT completed    01/07/2018    3:24 PM  MMSE - Mini Mental State Exam  Not completed: --        08/28/2024    3:19 PM 08/23/2023    3:19 PM 08/17/2022    9:20 AM  6CIT Screen  What Year? 0 points 0 points 0 points  What month? 0 points 0 points 0 points  What time? 0 points 0 points 0 points  Count back from 20 0 points 0 points 0 points  Months in reverse 0 points 0 points 0 points  Repeat phrase 0 points 0 points 2 points  Total Score 0 points 0 points 2 points    Immunizations Immunization History  Administered Date(s) Administered   Fluad Quad(high Dose 65+) 08/14/2020, 08/19/2021, 08/12/2022   INFLUENZA, HIGH DOSE SEASONAL PF 07/30/2015, 08/07/2016, 08/11/2017, 08/05/2018, 08/17/2019, 08/19/2023   Influenza Split 08/05/2011, 07/28/2012   Influenza,inj,Quad PF,6+ Mos 08/01/2013, 07/18/2014   Influenza-Unspecified 08/17/2019, 08/16/2021   PFIZER Comirnaty(Gray Top)Covid-19 Tri-Sucrose Vaccine 11/18/2019, 12/26/2019, 02/21/2021   PFIZER(Purple Top)SARS-COV-2 Vaccination 08/12/2020   PNEUMOCOCCAL CONJUGATE-20 06/14/2023   Pneumococcal Conjugate-13 05/14/2014   Pneumococcal Polysaccharide-23 06/05/2011   Pneumococcal-Unspecified 04/30/2017   Td 12/15/2003   Tdap 05/14/2014   Unspecified SARS-COV-2 Vaccination 07/26/2021, 08/08/2022   Zoster  Recombinant(Shingrix) 08/22/2019, 07/15/2022, 10/28/2022   Zoster, Live 03/13/2013, 08/01/2019    Screening Tests Health Maintenance  Topic Date Due   FOOT EXAM  Never done   Diabetic kidney evaluation - Urine ACR  Never done   DTaP/Tdap/Td (3 - Td or Tdap) 05/14/2024   Influenza Vaccine  06/16/2024   COVID-19 Vaccine (7 - 2025-26 season) 07/17/2024   Mammogram  08/25/2024   HEMOGLOBIN A1C  10/24/2024   OPHTHALMOLOGY EXAM  05/25/2025   Diabetic kidney evaluation - eGFR measurement  07/31/2025   Medicare Annual Wellness (AWV)  08/28/2025   Colonoscopy  05/20/2026   Pneumococcal Vaccine: 50+ Years  Completed   DEXA SCAN  Completed   Hepatitis C Screening  Completed   Zoster  Vaccines- Shingrix  Completed   Meningococcal B Vaccine  Aged Out    Health Maintenance Items Addressed:   Additional Screening:  Vision Screening: Recommended annual ophthalmology exams for early detection of glaucoma and other disorders of the eye. Is the patient up to date with their annual eye exam?  Yes  Who is the provider or what is the name of the office in which the patient attends annual eye exams? [facility]  Dental Screening: Recommended annual dental exams for proper oral hygiene  Community Resource Referral / Chronic Care Management: CRR required this visit?  No   CCM required this visit?  No   Plan:    I have personally reviewed and noted the following in the patient's chart:   Medical and social history Use of alcohol, tobacco or illicit drugs  Current medications and supplements including opioid prescriptions. Patient is not currently taking opioid prescriptions. Functional ability and status Nutritional status Physical activity Advanced directives List of other physicians Hospitalizations, surgeries, and ER visits in previous 12 months Vitals Screenings to include cognitive, depression, and falls Referrals and appointments  In addition, I have reviewed and discussed  with patient certain preventive protocols, quality metrics, and best practice recommendations. A written personalized care plan for preventive services as well as general preventive health recommendations were provided to patient.   Rojelio LELON Blush, LPN   [id number]   After Visit Summary: (In Person-Printed) AVS printed and given to the patient  Notes: Nothing significant to report at this time.

## 2024-08-28 NOTE — Patient Instructions (Addendum)
 Ms. Cullifer,  Thank you for taking the time for your Medicare Wellness Visit. I appreciate your continued commitment to your health goals. Please review the care plan we discussed, and feel free to reach out if I can assist you further.  Medicare recommends these wellness visits once per year to help you and your care team stay ahead of potential health issues. These visits are designed to focus on prevention, allowing your provider to concentrate on managing your acute and chronic conditions during your regular appointments.  Please note that Annual Wellness Visits do not include a physical exam. Some assessments may be limited, especially if the visit was conducted virtually. If needed, we may recommend a separate in-person follow-up with your provider.  Ongoing Care Seeing your primary care provider every 3 to 6 months helps us  monitor your health and provide consistent, personalized care.   Referrals If a referral was made during today's visit and you haven't received any updates within two weeks, please contact the referred provider directly to check on the status.  Recommended Screenings:  Health Maintenance  Topic Date Due   Complete foot exam   Never done   Yearly kidney health urinalysis for diabetes  Never done   DTaP/Tdap/Td vaccine (3 - Td or Tdap) 05/14/2024   Flu Shot  06/16/2024   COVID-19 Vaccine (7 - 2025-26 season) 07/17/2024   Breast Cancer Screening  08/25/2024   Hemoglobin A1C  10/24/2024   Eye exam for diabetics  05/25/2025   Yearly kidney function blood test for diabetes  07/31/2025   Medicare Annual Wellness Visit  08/28/2025   Colon Cancer Screening  05/20/2026   Pneumococcal Vaccine for age over 34  Completed   DEXA scan (bone density measurement)  Completed   Hepatitis C Screening  Completed   Zoster (Shingles) Vaccine  Completed   Meningitis B Vaccine  Aged Out       08/28/2024    3:18 PM  Advanced Directives  Does Patient Have a Medical Advance  Directive? Yes  Type of Estate agent of [place];Living will  Copy of Healthcare Power of Attorney in Chart? No - copy requested   Advance Care Planning is important because it: Ensures you receive medical care that aligns with your values, goals, and preferences. Provides guidance to your family and loved ones, reducing the emotional burden of decision-making during critical moments.  Vision: Annual vision screenings are recommended for early detection of glaucoma, cataracts, and diabetic retinopathy. These exams can also reveal signs of chronic conditions such as diabetes and high blood pressure.  Dental: Annual dental screenings help detect early signs of oral cancer, gum disease, and other conditions linked to overall health, including heart disease and diabetes.  Please see the attached documents for additional preventive care recommendations.

## 2024-08-30 DIAGNOSIS — M1712 Unilateral primary osteoarthritis, left knee: Secondary | ICD-10-CM | POA: Diagnosis not present

## 2024-08-30 DIAGNOSIS — G8929 Other chronic pain: Secondary | ICD-10-CM | POA: Diagnosis not present

## 2024-08-30 DIAGNOSIS — M25562 Pain in left knee: Secondary | ICD-10-CM | POA: Diagnosis not present

## 2024-08-31 ENCOUNTER — Other Ambulatory Visit (HOSPITAL_COMMUNITY): Payer: Self-pay

## 2024-08-31 ENCOUNTER — Other Ambulatory Visit: Payer: Self-pay | Admitting: Internal Medicine

## 2024-09-04 ENCOUNTER — Other Ambulatory Visit (HOSPITAL_COMMUNITY): Payer: Self-pay

## 2024-09-06 ENCOUNTER — Other Ambulatory Visit: Payer: Self-pay | Admitting: Internal Medicine

## 2024-09-06 ENCOUNTER — Encounter: Payer: Self-pay | Admitting: Internal Medicine

## 2024-09-06 ENCOUNTER — Other Ambulatory Visit (HOSPITAL_COMMUNITY): Payer: Self-pay

## 2024-09-06 MED ORDER — PRALUENT 150 MG/ML ~~LOC~~ SOAJ
150.0000 mg | SUBCUTANEOUS | 1 refills | Status: AC
  Filled 2024-09-06: qty 6, 84d supply, fill #0
  Filled 2024-10-24 – 2024-11-07 (×2): qty 6, 84d supply, fill #1

## 2024-09-07 ENCOUNTER — Ambulatory Visit (INDEPENDENT_AMBULATORY_CARE_PROVIDER_SITE_OTHER)

## 2024-09-07 DIAGNOSIS — M818 Other osteoporosis without current pathological fracture: Secondary | ICD-10-CM | POA: Diagnosis not present

## 2024-09-07 MED ORDER — DENOSUMAB 60 MG/ML ~~LOC~~ SOSY: 60.0000 mg | PREFILLED_SYRINGE | Freq: Once | SUBCUTANEOUS | Status: AC

## 2024-09-07 NOTE — Progress Notes (Signed)
 Per orders of Dr. Ozell, injection of Prolia  60 mg given by Ernesteen Mihalic L Myrel Rappleye. Patient tolerated injection well.

## 2024-09-26 ENCOUNTER — Other Ambulatory Visit: Payer: Self-pay | Admitting: Nurse Practitioner

## 2024-09-26 DIAGNOSIS — Z170 Estrogen receptor positive status [ER+]: Secondary | ICD-10-CM

## 2024-09-26 NOTE — Progress Notes (Signed)
 Patient Care Team: Micheal Wolm ORN, MD as PCP - General (Family Medicine) Loni Soyla LABOR, MD as PCP - Cardiology (Cardiology) Cleotilde Carlin Lenis, OD as Referring Physician (Optometry) Curvin Deward MOULD, MD as Consulting Physician (General Surgery) Tyree Nanetta SAILOR, RN as Oncology Nurse Navigator Lanny Callander, MD as Consulting Physician (Hematology) Burton, Lacie K, NP as Nurse Practitioner (Nurse Practitioner) Izell Domino, MD as Attending Physician (Radiation Oncology)  Clinic Day:  09/27/2024  Referring physician: Micheal Wolm ORN, MD  ASSESSMENT & PLAN:   Assessment & Plan: Cancer of central portion of left female breast (HCC) Stage IA, c(T1bN0M0), ER/PR+, HER2-, Grade II, pT1cN1aM0, stage IA  -Diagnosed in 08/2019, s/p B/l breast lumpectomy and SLNB with Dr. Curvin on 10/06/19. Path showed: 1.8 cm IDC and DCIS, anterior margin focally positive; one positive lymph node with extranodal extension (1/3). Re-excision on 11/06/19 showed DCIS only, margins negative.  -Mammaprint showed high risk. She did not tolerate TC (allergic reaction to taxol ) or Abraxane  (symptomatic tachycardia, GI symptoms). -She completed adjuvant RT with Dr Izell 01/25/20 - 03/06/20.  -She declined genetic testing -She took antiestrogen therapy over 2 months 05/2020-07/2020 and stopped due to poor toleration/side effects (exacerbated IBS from tamoxifen ; bone concerns and anxiety from exemestane , declined to restart with SSRI). -3D diagnostic mammogram was done in 08/26/2023 and was negative for evidence of malignancy.   -3D screening mammogram on 08/28/2024.  She has breast density category B and overall benign results.   -DEXA scan from 08/22/2021 showing osteoporosis.  10-year FRAX score is 17.7% for major osteoporotic fracture and 3.5% for hip fracture.  She does take daily calcium  and vitamin D .   - Continue breast cancer surveillance with labs and follow-up in 6 months, sooner if needed.She states both of  these medications cause her to have GI upset.   History MI Patient with significant MI in March 2025.  Had multiple stents placed.  On Praluent  and Brilinta .  Doing well.  Blood pressure slightly elevated during today's visit.  She states this is normal when she goes to provider visits.  She monitors her blood pressure at home generally runs around 140/80.  Followed regularly by primary care and cardiology.  Osteoporosis Most recent DEXA scan done 08/22/2021 showed osteoporosis.  She is unable to take calcium  or vitamin D .  Causes her to have significant GI upset.  She currently gets Prolia  injections every 6 months per her primary care provider.  History of left breast cancer Most recent 3D mammogram done 08/28/2024.  She has category B breast density and overall benign results.  Repeat mammogram in October 2026.  We discussed changing visits to annual.  She prefers to keep visits on 58-month schedule.  Gets her peace of mind to be seen and checked more frequently than once a year.  Plan Labs reviewed. -CBC and CMP unremarkable. Mammogram from 08/28/2024 was benign.  Plan to repeat in October 2026. Recommend continuation of Prolia  every 6 months with primary care. Plan for labs and follow-up in 6 months, sooner if needed.   The patient understands the plans discussed today and is in agreement with them.  She knows to contact our office if she develops concerns prior to her next appointment.  I provided 25 minutes of face-to-face time during this encounter and > 50% was spent counseling as documented under my assessment and plan.    Powell FORBES Lessen, NP  [facility] - A DEPT OF [place]. [place]  [facility] [address] [place] [address] Dept: [phone] Dept Fax: [phone]   No orders of the defined types were placed in this encounter.     CHIEF COMPLAINT:  CC: Left breast cancer, ER +  Current Treatment:  Surveillance  INTERVAL HISTORY:  Leita is here today for repeat clinical assessment.  She last saw me on 03/23/2024.  She had 3D screening mammogram on 08/28/2024.  She has breast density category B and overall benign results.  The most recent DEXA scan from 08/22/2021 showing osteoporosis.  10-year FRAX score is 17.7% for major osteoporotic fracture and 3.5% for hip fracture.  She does take daily calcium  and vitamin D .  She does get Prolia  injections per her primary care provider.  She denies changes, new lumps, or masses in either breast.  She denies new chest pain, chest pressure, or shortness of breath. She denies headaches or visual disturbances. She denies abdominal pain, nausea, vomiting, or changes in bowel or bladder habits.  She denies fevers or chills. She denies pain. Her appetite is good. Her weight has been stable.  I have reviewed the past medical history, past surgical history, social history and family history with the patient and they are unchanged from previous note.  ALLERGIES:  is allergic to taxotere  [docetaxel ], abraxane  [paclitaxel  protein-bound part], amlodipine , crestor  [rosuvastatin  calcium ], lipitor [atorvastatin  calcium ], lisinopril , losartan , nsaids, pravastatin , and sulfonamide derivatives.  MEDICATIONS:  Current Outpatient Medications  Medication Sig Dispense Refill   acetaminophen  (TYLENOL ) 500 MG tablet Take 500 mg by mouth every 6 (six) hours as needed for headache or mild pain.     Alirocumab  (PRALUENT ) 150 MG/ML SOAJ Inject 1 mL (150 mg total) into the skin every 14 (fourteen) days. 6 mL 1   aspirin  EC 81 MG tablet Take 1 tablet (81 mg total) by mouth daily. Swallow whole. 90 tablet 2   denosumab  (PROLIA ) 60 MG/ML SOSY injection Inject 60 mg into the skin every 6 (six) months. Last inj was in October     loratadine (CLARITIN) 10 MG tablet Take 10 mg by mouth daily.     metoprolol  succinate (TOPROL -XL) 25 MG 24 hr tablet Take 1 tablet (25 mg total) by mouth daily.  Please keep upcoming appointment in September 2025 for future refills. Thank you 90 tablet 0   nitroGLYCERIN  (NITROSTAT ) 0.4 MG SL tablet Place 1 tablet (0.4 mg total) under the tongue every 5 (five) minutes x 3 doses as needed for chest pain. 25 tablet 2   rosuvastatin  (CRESTOR ) 10 MG tablet Take 1 tablet (10 mg total) by mouth daily. Please keep upcoming appointment in September 2025 for future refills. Thank you 90 tablet 0   ticagrelor  (BRILINTA ) 90 MG TABS tablet Take 1 tablet (90 mg total) by mouth 2 (two) times daily. 180 tablet 2   clidinium-chlordiazePOXIDE  (LIBRAX ) 5-2.5 MG capsule TAKE 1 CAPSULE EVERY DAY AS NEEDED (Patient not taking: Reported on 09/27/2024) 90 capsule 0   Current Facility-Administered Medications  Medication Dose Route Frequency Provider Last Rate Last Admin   [START ON 03/09/2025] denosumab  (PROLIA ) injection 60 mg  60 mg Subcutaneous Once Burchette, Wolm ORN, MD        HISTORY OF PRESENT ILLNESS:   Oncology History Overview Note  Cancer Staging Cancer of central portion of left female breast [facility]) Staging form: Breast, AJCC 8th Edition - Clinical stage from 09/18/2019: Stage IA (cT1b, cN0, cM0, G2, ER+, PR+, HER2-) - Signed by Lanny Callander, MD on 09/18/2019 - Pathologic stage from 10/06/2019:  Stage IA (pT1c, pN1a, cM0, G2, ER+, PR+, HER2-) - Signed by Lanny Callander, MD on 10/19/2019    Cancer of central portion of left female breast (HCC)  08/24/2019 Mammogram   Diagnostic mammogram 08/24/19  IMPRESSION: 1. Left breast mass measuring 0.8 x 0.4 x 0.5 cm at the 1 o'clock retroareolar region is suspicious and likely corresponds to the area of distortion seen on mammography.   2. Left breast 3 o'clock retroareolar dilated duct with internal debris versus solid mass.   3. Right breast retroareolar focal asymmetry without sonographic correlate.   08/28/2019 Initial Biopsy   Final Diagnosis 08/28/19 1. Breast, left, needle core biopsy, 1 o'clock/retroareolar -  INVASIVE DUCTAL CARCINOMA, GRADE II. - PERINEURAL INVASION. - SEE MICROSCOPIC DESCRIPTION. 2. Breast, left, needle core biopsy, 3 o'clock retroareolar - DUCTAL PAPILLOMA. - SEE MICROSCOPIC DESCRIPTION.   08/28/2019 Receptors her2   1. PROGNOSTIC INDICATORS 08/28/19 Results: IMMUNOHISTOCHEMICAL AND MORPHOMETRIC ANALYSIS PERFORMED MANUALLY The tumor cells are NEGATIVE for Her2 (0). Estrogen Receptor: 100%, POSITIVE, STRONG STAINING INTENSITY Progesterone Receptor: 50%, POSITIVE, STRONG STAINING INTENSITY Proliferation Marker Ki67: 12%   09/01/2019 Pathology Results   Diagnosis 09/01/19 Breast, right, needle core biopsy, retroareolar - COMPLEX SCLEROSING LESION WITH A PAPILLARY SCLEROSING LESION AND USUAL DUCTAL HYPERPLASIA. SEE NOTE - NEGATIVE FOR CARCINOMA   09/18/2019 Initial Diagnosis   Cancer of central portion of left female breast (HCC)   09/18/2019 Cancer Staging   Staging form: Breast, AJCC 8th Edition - Clinical stage from 09/18/2019: Stage IA (cT1b, cN0, cM0, G2, ER+, PR+, HER2-) - Signed by Lanny Callander, MD on 09/18/2019   10/06/2019 Surgery   LEFT BREAST LUMPECTOMY  X2 WITH RADIOACTIVE SEED X2  AND SENTINEL LYMPH NODE BIOPSY and  RIGHT BREAST LUMPECTOMY WITH RADIOACTIVE SEED LOCALIZATION by Dr Curvin  10/06/19    10/06/2019 Pathology Results   FINAL MICROSCOPIC DIAGNOSIS: 10/06/19   A. BREAST, RIGHT, LUMPECTOMY:  - Fibrocystic change with a small incidental intraductal papilloma and  usual ductal hyperplasia  - Biopsy site changes  - Negative for carcinoma   B. LYMPH NODE, LEFT #1, SENTINEL, BIOPSY:  - Metastatic carcinoma to a lymph node (1/1)  - Focus of metastatic carcinoma measures 2.5 mm and shows extranodal  extension   C. LYMPH NODE, LEFT #2, SENTINEL, BIOPSY:  - Lymph node, negative for carcinoma (0/1)   D. LYMPH NODE, LEFT #3, SENTINEL, BIOPSY:  - Lymph node, negative for carcinoma (0/1)   E. BREAST, LEFT, LUMPECTOMY:  - Invasive ductal carcinoma,  grade 2, 1.8 cm  - Ductal carcinoma in situ, intermediate grade  - Invasive carcinoma focally involves the anterior margin  - DCIS is less than 1 mm from the superior margin  - Background breast parenchyma with fibrocystic change, usual ductal  hyperplasia and intraductal papilloma.  - Biopsy site changes  - See oncology table    10/06/2019 Miscellaneous   Her Mammaprint showed High Risk Luminal Type B, risk of recurrence in the next 10 years is 29% in high risk disease. MPI -0.335    10/06/2019 Cancer Staging   Staging form: Breast, AJCC 8th Edition - Pathologic stage from 10/06/2019: Stage IA (pT1c, pN1a, cM0, G2, ER+, PR+, HER2-) - Signed by Lanny Callander, MD on 10/19/2019   11/06/2019 Surgery   EXCISION NIPPLE AND AREOLA  MARGIN LEFT BREAST by Dr Curvin  11/06/19    11/06/2019 Pathology Results    FINAL MICROSCOPIC DIAGNOSIS: 11/06/19  A. BREAST, LEFT CENTRAL, RE-EXCISION:  -  Residual ductal carcinoma in situ,  intermediate grade  -  Margins uninvolved by carcinoma (0.4 cm; superior margin)  -  Atypical intraductal papilloma  -  Usual ductal hyperplasia  -  Benign skin with seborrheic keratosis  -  Previous surgical site changes  -  No invasive carcinoma identified    12/06/2019 - 12/20/2019 Chemotherapy   Adjuvant chemo TC q3weeks for 4 cycles starting 12/06/19. With first TC infusion she had allergy reaction to docetaxel  and carbo was not given on 12/06/19, we discontinued.   -I started her on weekly Abraxane  on 12/20/19, but 2 days after infusion she started to feel tachycardic, hot/flushed, and lightheaded. A few days later she had dysuria, diarrhea and abdominal cramps and intermittent chest pain. She opted to d/c chemo and proceed with RT.    01/25/2020 - 03/06/2020 Radiation Therapy   Adjuvant Radiation with Dr Izell 01/25/20-03/06/20   05/2020 - 07/2020 Anti-estrogen oral therapy   I started her on antiestrogen therapy with Tamoxifen  in 03/2020. She took until 06/06/20 and 10mg   06/04/20- 06/2020 but stopped due to exacerbated IBS symptoms. She also tried 2 weeks of Exemestane  in 07/2020 but stopped due to increased Anxiety.   06/06/2020 Survivorship   Care plan delivered by Lacie Burton, NP    12/04/2020 Imaging   CT chest  IMPRESSION: 1. Marked interval worsening of parenchymal disease now involving areas of the LEFT lower lobe, lingula, RIGHT upper lobe and with nodular opacity in the RIGHT lower lobe. Findings could be seen in the setting of COVID-19 infection. Pattern of disease also raises the question of organizing pneumonia. Pneumonitis, drug related changes are also considered. Pattern of disease would be atypical for neoplasm. Pulmonary consultation is suggested if not yet performed. Continued short interval surveillance is also suggested. 2. Post LEFT breast lumpectomy and axillary dissection. 3. Calcified coronary artery disease in LEFT and RIGHT coronary circulation. 4. Stable LEFT adrenal nodule compatible with benign adenoma. 5. Aortic atherosclerosis.   Aortic Atherosclerosis (ICD10-I70.0).   08/26/2023 Mammogram   Bilateral diagnostic mammogram IMPRESSION: No evidence for malignancy within either breast.  RECOMMENDATION: Screening mammogram in one year.(Code:[id number])     08/30/2024 Mammogram   Bilateral screening mammogram  FINDINGS: Sequela of right breast excisional biopsy and left breast lumpectomy. No findings suspicious for malignancy in either breast.  IMPRESSION: No mammographic evidence of malignancy.   RECOMMENDATION: Screening mammogram in one year. (Code:[id number])  BI-RADS CATEGORY  2: Benign.         REVIEW OF SYSTEMS:   Constitutional: Denies fevers, chills or abnormal weight loss Eyes: Denies blurriness of vision Ears, nose, mouth, throat, and face: Denies mucositis or sore throat Respiratory: Denies cough, dyspnea or wheezes Cardiovascular: Denies palpitation, chest discomfort or lower extremity  swelling Gastrointestinal:  Denies nausea, heartburn or change in bowel habits Skin: Denies abnormal skin rashes Lymphatics: Denies new lymphadenopathy or easy bruising Neurological:Denies numbness, tingling or new weaknesses Behavioral/Psych: Mood is stable, no new changes  All other systems were reviewed with the patient and are negative.   VITALS:   Today's Vitals   09/27/24 1300  BP: (!) 151/68  Pulse: (!) 55  Resp: 18  Temp: (!) 97.5 F (36.4 C)  TempSrc: Temporal  SpO2: 96%  Weight: 184 lb 6.4 oz (83.6 kg)  Height: 5' 3 (1.6 m)   Body mass index is 32.66 kg/m.   Wt Readings from Last 3 Encounters:  09/27/24 184 lb 6.4 oz (83.6 kg)  08/28/24 185 lb 11.2 oz (84.2 kg)  08/23/24 185 lb 4.8 oz (  84.1 kg)    Body mass index is 32.66 kg/m.  Performance status (ECOG): 1 - Symptomatic but completely ambulatory  PHYSICAL EXAM:   GENERAL:alert, no distress and comfortable SKIN: skin color, texture, turgor are normal, no rashes or significant lesions EYES: normal, Conjunctiva are pink and non-injected, sclera clear OROPHARYNX:no exudate, no erythema and lips, buccal mucosa, and tongue normal  NECK: supple, thyroid normal size, non-tender, without nodularity LYMPH:  no palpable lymphadenopathy in the cervical, axillary or inguinal LUNGS: clear to auscultation and percussion with normal breathing effort HEART: regular rate & rhythm and no murmurs and no lower extremity edema ABDOMEN:abdomen soft, non-tender and normal bowel sounds Musculoskeletal:no cyanosis of digits and no clubbing  NEURO: alert & oriented x 3 with fluent speech, no focal motor/sensory deficits BREAST: Left breast has some firm tissue adjacent to the surgical scars. No palpable lumps or masses in left breast. No nipple inversion or nipple discharge. There is no axillary lymphadenopathy on the left. There are no palpable lumps or masses in the right breast. No nippled inversion or nipple discharge. There is  no axillary lymphadenopathy on the right.   LABORATORY DATA:  I have reviewed the data as listed    Component Value Date/Time   NA 139 09/27/2024 1233   NA 143 07/31/2024 0857   K 4.3 09/27/2024 1233   CL 106 09/27/2024 1233   CO2 28 09/27/2024 1233   GLUCOSE 105 (H) 09/27/2024 1233   BUN 23 09/27/2024 1233   BUN 17 07/31/2024 0857   CREATININE 0.96 09/27/2024 1233   CREATININE 0.92 08/14/2020 1025   CALCIUM  8.8 (L) 09/27/2024 1233   PROT 6.6 09/27/2024 1233   PROT 6.4 07/31/2024 0857   ALBUMIN 4.0 09/27/2024 1233   ALBUMIN 4.4 07/31/2024 0857   AST 14 (L) 09/27/2024 1233   ALT 15 09/27/2024 1233   ALKPHOS 59 09/27/2024 1233   BILITOT 0.4 09/27/2024 1233   GFRNONAA >60 09/27/2024 1233   GFRAA >60 02/12/2020 1230   GFRAA >60 01/03/2020 1241     Lab Results  Component Value Date   WBC 6.4 09/27/2024   NEUTROABS 4.4 09/27/2024   HGB 13.2 09/27/2024   HCT 40.0 09/27/2024   MCV 88.9 09/27/2024   PLT 234 09/27/2024

## 2024-09-26 NOTE — Assessment & Plan Note (Addendum)
 Stage IA, c(T1bN0M0), ER/PR+, HER2-, Grade II, pT1cN1aM0, stage IA  -Diagnosed in 08/2019, s/p B/l breast lumpectomy and SLNB with Dr. Curvin on 10/06/19. Path showed: 1.8 cm IDC and DCIS, anterior margin focally positive; one positive lymph node with extranodal extension (1/3). Re-excision on 11/06/19 showed DCIS only, margins negative.  -Mammaprint showed high risk. She did not tolerate TC (allergic reaction to taxol ) or Abraxane  (symptomatic tachycardia, GI symptoms). -She completed adjuvant RT with Dr Izell 01/25/20 - 03/06/20.  -She declined genetic testing -She took antiestrogen therapy over 2 months 05/2020-07/2020 and stopped due to poor toleration/side effects (exacerbated IBS from tamoxifen ; bone concerns and anxiety from exemestane , declined to restart with SSRI). -3D diagnostic mammogram was done in 08/26/2023 and was negative for evidence of malignancy.   -3D screening mammogram on 08/28/2024.  She has breast density category B and overall benign results.   -DEXA scan from 08/22/2021 showing osteoporosis.  10-year FRAX score is 17.7% for major osteoporotic fracture and 3.5% for hip fracture.  She does take daily calcium  and vitamin D .   - Continue breast cancer surveillance with labs and follow-up in 6 months, sooner if needed.

## 2024-09-27 ENCOUNTER — Inpatient Hospital Stay (HOSPITAL_BASED_OUTPATIENT_CLINIC_OR_DEPARTMENT_OTHER): Admitting: Nurse Practitioner

## 2024-09-27 ENCOUNTER — Inpatient Hospital Stay: Attending: Nurse Practitioner

## 2024-09-27 VITALS — BP 151/68 | HR 55 | Temp 97.5°F | Resp 18 | Ht 63.0 in | Wt 184.4 lb

## 2024-09-27 DIAGNOSIS — C50112 Malignant neoplasm of central portion of left female breast: Secondary | ICD-10-CM

## 2024-09-27 DIAGNOSIS — Z170 Estrogen receptor positive status [ER+]: Secondary | ICD-10-CM | POA: Diagnosis not present

## 2024-09-27 DIAGNOSIS — F419 Anxiety disorder, unspecified: Secondary | ICD-10-CM | POA: Diagnosis not present

## 2024-09-27 DIAGNOSIS — I252 Old myocardial infarction: Secondary | ICD-10-CM | POA: Diagnosis not present

## 2024-09-27 DIAGNOSIS — K580 Irritable bowel syndrome with diarrhea: Secondary | ICD-10-CM | POA: Diagnosis not present

## 2024-09-27 DIAGNOSIS — M810 Age-related osteoporosis without current pathological fracture: Secondary | ICD-10-CM | POA: Diagnosis not present

## 2024-09-27 DIAGNOSIS — Z853 Personal history of malignant neoplasm of breast: Secondary | ICD-10-CM | POA: Diagnosis not present

## 2024-09-27 LAB — CBC WITH DIFFERENTIAL (CANCER CENTER ONLY)
Abs Immature Granulocytes: 0.02 K/uL (ref 0.00–0.07)
Basophils Absolute: 0 K/uL (ref 0.0–0.1)
Basophils Relative: 1 %
Eosinophils Absolute: 0.1 K/uL (ref 0.0–0.5)
Eosinophils Relative: 1 %
HCT: 40 % (ref 36.0–46.0)
Hemoglobin: 13.2 g/dL (ref 12.0–15.0)
Immature Granulocytes: 0 %
Lymphocytes Relative: 23 %
Lymphs Abs: 1.5 K/uL (ref 0.7–4.0)
MCH: 29.3 pg (ref 26.0–34.0)
MCHC: 33 g/dL (ref 30.0–36.0)
MCV: 88.9 fL (ref 80.0–100.0)
Monocytes Absolute: 0.4 K/uL (ref 0.1–1.0)
Monocytes Relative: 7 %
Neutro Abs: 4.4 K/uL (ref 1.7–7.7)
Neutrophils Relative %: 68 %
Platelet Count: 234 K/uL (ref 150–400)
RBC: 4.5 MIL/uL (ref 3.87–5.11)
RDW: 14 % (ref 11.5–15.5)
WBC Count: 6.4 K/uL (ref 4.0–10.5)
nRBC: 0 % (ref 0.0–0.2)

## 2024-09-27 LAB — CMP (CANCER CENTER ONLY)
ALT: 15 U/L (ref 0–44)
AST: 14 U/L — ABNORMAL LOW (ref 15–41)
Albumin: 4 g/dL (ref 3.5–5.0)
Alkaline Phosphatase: 59 U/L (ref 38–126)
Anion gap: 5 (ref 5–15)
BUN: 23 mg/dL (ref 8–23)
CO2: 28 mmol/L (ref 22–32)
Calcium: 8.8 mg/dL — ABNORMAL LOW (ref 8.9–10.3)
Chloride: 106 mmol/L (ref 98–111)
Creatinine: 0.96 mg/dL (ref 0.44–1.00)
GFR, Estimated: >60 mL/min (ref >60)
Glucose, Bld: 105 mg/dL — ABNORMAL HIGH (ref 70–99)
Potassium: 4.3 mmol/L (ref 3.5–5.1)
Sodium: 139 mmol/L (ref 135–145)
Total Bilirubin: 0.4 mg/dL (ref 0.0–1.2)
Total Protein: 6.6 g/dL (ref 6.5–8.1)

## 2024-10-01 ENCOUNTER — Encounter: Payer: Self-pay | Admitting: Nurse Practitioner

## 2024-10-09 ENCOUNTER — Other Ambulatory Visit: Payer: Self-pay | Admitting: Cardiology

## 2024-10-09 ENCOUNTER — Other Ambulatory Visit (HOSPITAL_BASED_OUTPATIENT_CLINIC_OR_DEPARTMENT_OTHER): Payer: Self-pay

## 2024-10-09 ENCOUNTER — Other Ambulatory Visit: Payer: Self-pay | Admitting: Internal Medicine

## 2024-10-10 ENCOUNTER — Other Ambulatory Visit (HOSPITAL_BASED_OUTPATIENT_CLINIC_OR_DEPARTMENT_OTHER): Payer: Self-pay

## 2024-10-11 ENCOUNTER — Other Ambulatory Visit (HOSPITAL_BASED_OUTPATIENT_CLINIC_OR_DEPARTMENT_OTHER): Payer: Self-pay

## 2024-10-11 ENCOUNTER — Other Ambulatory Visit: Payer: Self-pay

## 2024-10-11 ENCOUNTER — Other Ambulatory Visit (HOSPITAL_COMMUNITY): Payer: Self-pay

## 2024-10-11 ENCOUNTER — Encounter: Payer: Self-pay | Admitting: Internal Medicine

## 2024-10-11 MED ORDER — METOPROLOL SUCCINATE ER 25 MG PO TB24
25.0000 mg | ORAL_TABLET | Freq: Every day | ORAL | 0 refills | Status: AC
  Filled 2024-10-11 (×2): qty 90, 90d supply, fill #0

## 2024-10-11 MED ORDER — ROSUVASTATIN CALCIUM 10 MG PO TABS
10.0000 mg | ORAL_TABLET | Freq: Every day | ORAL | 3 refills | Status: DC
  Filled 2024-10-11: qty 90, 90d supply, fill #0

## 2024-10-11 MED ORDER — TICAGRELOR 90 MG PO TABS
90.0000 mg | ORAL_TABLET | Freq: Two times a day (BID) | ORAL | 3 refills | Status: DC
  Filled 2024-10-11: qty 180, 90d supply, fill #0

## 2024-10-11 MED ORDER — TICAGRELOR 90 MG PO TABS
90.0000 mg | ORAL_TABLET | Freq: Two times a day (BID) | ORAL | 3 refills | Status: AC
  Filled 2024-10-11 (×2): qty 180, 90d supply, fill #0

## 2024-10-11 MED ORDER — ASPIRIN 81 MG PO TBEC
81.0000 mg | DELAYED_RELEASE_TABLET | Freq: Every day | ORAL | 3 refills | Status: AC
  Filled 2024-10-11 (×2): qty 90, 90d supply, fill #0

## 2024-10-11 MED ORDER — ASPIRIN 81 MG PO TBEC
81.0000 mg | DELAYED_RELEASE_TABLET | Freq: Every day | ORAL | 3 refills | Status: DC
  Filled 2024-10-11: qty 90, 90d supply, fill #0

## 2024-10-11 MED ORDER — METOPROLOL SUCCINATE ER 25 MG PO TB24
25.0000 mg | ORAL_TABLET | Freq: Every day | ORAL | 0 refills | Status: DC
  Filled 2024-10-11: qty 90, 90d supply, fill #0

## 2024-10-11 MED ORDER — ROSUVASTATIN CALCIUM 10 MG PO TABS
10.0000 mg | ORAL_TABLET | Freq: Every day | ORAL | 3 refills | Status: AC
  Filled 2024-10-11 (×2): qty 90, 90d supply, fill #0

## 2024-10-13 ENCOUNTER — Other Ambulatory Visit (HOSPITAL_BASED_OUTPATIENT_CLINIC_OR_DEPARTMENT_OTHER): Payer: Self-pay

## 2024-10-24 ENCOUNTER — Other Ambulatory Visit: Payer: Self-pay | Admitting: Family Medicine

## 2024-10-24 MED ORDER — CILIDINIUM-CHLORDIAZEPOXIDE 2.5-5 MG PO CAPS: 1.0000 | ORAL_CAPSULE | Freq: Three times a day (TID) | ORAL | 0 refills | Status: DC | PRN

## 2024-10-25 ENCOUNTER — Other Ambulatory Visit (HOSPITAL_COMMUNITY): Payer: Self-pay

## 2024-10-27 NOTE — Telephone Encounter (Signed)
 Patient informed of the message below.

## 2024-10-30 ENCOUNTER — Encounter: Payer: Self-pay | Admitting: Family Medicine

## 2024-10-30 ENCOUNTER — Other Ambulatory Visit (HOSPITAL_BASED_OUTPATIENT_CLINIC_OR_DEPARTMENT_OTHER): Payer: Self-pay

## 2024-10-30 ENCOUNTER — Other Ambulatory Visit: Payer: Self-pay

## 2024-10-30 ENCOUNTER — Ambulatory Visit: Admitting: Family Medicine

## 2024-10-30 VITALS — BP 136/60 | HR 71 | Temp 97.6°F | Wt 186.9 lb

## 2024-10-30 DIAGNOSIS — E119 Type 2 diabetes mellitus without complications: Secondary | ICD-10-CM | POA: Diagnosis not present

## 2024-10-30 DIAGNOSIS — H60543 Acute eczematoid otitis externa, bilateral: Secondary | ICD-10-CM | POA: Diagnosis not present

## 2024-10-30 DIAGNOSIS — K589 Irritable bowel syndrome without diarrhea: Secondary | ICD-10-CM | POA: Diagnosis not present

## 2024-10-30 DIAGNOSIS — E782 Mixed hyperlipidemia: Secondary | ICD-10-CM | POA: Diagnosis not present

## 2024-10-30 DIAGNOSIS — E118 Type 2 diabetes mellitus with unspecified complications: Secondary | ICD-10-CM

## 2024-10-30 LAB — POCT GLYCOSYLATED HEMOGLOBIN (HGB A1C): Hemoglobin A1C: 6.5 % — AB (ref 4.0–5.6)

## 2024-10-30 MED ORDER — MOMETASONE FUROATE 0.1 % EX SOLN
CUTANEOUS | 1 refills | Status: AC
  Filled 2024-10-30: qty 60, 30d supply, fill #0

## 2024-10-30 MED ORDER — CILIDINIUM-CHLORDIAZEPOXIDE 2.5-5 MG PO CAPS
1.0000 | ORAL_CAPSULE | Freq: Three times a day (TID) | ORAL | 0 refills | Status: AC | PRN
  Filled 2024-10-30: qty 60, 20d supply, fill #0

## 2024-10-30 NOTE — Progress Notes (Signed)
 Established Patient Office Visit  Subjective   Patient ID: Shelly [name], female    DOB: Mar 25, 1946  Age: 78 y.o. MRN: [id number]  Chief Complaint  Patient presents with   Medication Consultation    HPI   Mrs Lemaster has history of CAD, hypertension, IBS, hyperglycemia, osteoporosis.  She recently had contacted us  about possible alternative to Librax -which she has taken for years intermittently for IBS symptoms..  She has paid out-of-pocket in the past.  She takes Librax  and has for years only intermittently for severe abdominal cramping and diarrhea.  She is tolerating this well without difficulty.  Pharmacy had suggested alternative of glycopyrrolate.  We reviewed cautions there are several listed including history of CAD.  We were reluctant to prescribe a new medication with her past history.  She has also history of breast cancer.  She has not had any recent chest pains.  She is on aggressive antilipid therapy with rosuvastatin  and Praluent .  Recent lipids were very well-controlled with total cholesterol 98 and LDL of 23.  She denies any recent chest pains.  Not exercising regularly.  She and her husband recently joined [employer] and hopes to start back at that exercise center 3 times per week.  She has had issue recently with some eczema and scaling in both ear canals.  Moderate pruritus.  Symptoms are relatively constant.  She has history of hyperglycemia.  She has been able to manage this without medications in the past.  Last A1c 6.4%.  Past Medical History:  Diagnosis Date   Allergy    Arthritis    wrist, shoulder    Cancer (HCC) 08/2019   left breast IDC   Diverticulitis    Endometrial polyp    Family history of breast cancer    GERD (gastroesophageal reflux disease)    occasional - diet controlled   Hx of adenomatous colonic polyps    Hyperlipidemia    IBS (irritable bowel syndrome)    Missed abortion    x 1 - resolved - no surgery   Osteoporosis    SVD  (spontaneous vaginal delivery)    x 1   Transient vision disturbance 2019   was evaluated by ED, no findings   Vitamin D  deficiency    Past Surgical History:  Procedure Laterality Date   BREAST EXCISIONAL BIOPSY Right 08/2019   Benign, lesion    BREAST LUMPECTOMY Left 09/2019   BREAST LUMPECTOMY WITH RADIOACTIVE SEED AND SENTINEL LYMPH NODE BIOPSY Left 10/06/2019   Procedure: LEFT BREAST LUMPECTOMY  X2 WITH RADIOACTIVE SEED X2  AND SENTINEL LYMPH NODE BIOPSY;  Surgeon: Curvin Deward MOULD, MD;  Location: [facility];  Service: General;  Laterality: Left;   BREAST LUMPECTOMY WITH RADIOACTIVE SEED LOCALIZATION Right 10/06/2019   Procedure: RIGHT BREAST LUMPECTOMY WITH RADIOACTIVE SEED LOCALIZATION;  Surgeon: Curvin Deward MOULD, MD;  Location:  SUR[facility]ERY CENTER;  Service: General;  Laterality: Right;   BRONCHIAL BIOPSY  12/27/2020   Procedure: BRONCHIAL BIOPSIES;  Surgeon: Brenna Adine CROME, DO;  Location: [facility];  Service: Pulmonary;;   BRONCHIAL BRUSHINGS  12/27/2020   Procedure: BRONCHIAL BRUSHINGS;  Surgeon: Brenna Adine CROME, DO;  Location: [facility];  Service: Pulmonary;;   BRONCHIAL NEEDLE ASPIRATION BIOPSY  12/27/2020   Procedure: BRONCHIAL NEEDLE ASPIRATION BIOPSIES;  Surgeon: Brenna Adine CROME, DO;  Location: [facility];  Service: Pulmonary;;   BRONCHIAL WASHINGS  12/27/2020   Procedure: BRONCHIAL WASHINGS;  Surgeon: Brenna Adine CROME, DO;  Location: [facility];  Service:  Pulmonary;;   COLONOSCOPY     CORONARY ANGIOGRAPHY N/A 01/18/2024   Procedure: CORONARY ANGIOGRAPHY;  Surgeon: Wendel Lurena POUR, MD;  Location: [facility];  Service: Cardiovascular;  Laterality: N/A;   CORONARY IMAGING/OCT N/A 01/18/2024   Procedure: CORONARY IMAGING/OCT;  Surgeon: Wendel Lurena POUR, MD;  Location: [facility];  Service: Cardiovascular;  Laterality: N/A;   CORONARY STENT INTERVENTION N/A 01/18/2024   Procedure: CORONARY STENT INTERVENTION;  Surgeon: Wendel Lurena POUR, MD;   Location: [facility];  Service: Cardiovascular;  Laterality: N/A;   DIAGNOSTIC LAPAROSCOPY     cysts   DILATATION & CURETTAGE/HYSTEROSCOPY WITH TRUECLEAR N/A 10/31/2013   Procedure: DILATATION & CURETTAGE/HYSTEROSCOPY WITH TRUCLEAR;  Surgeon: Truman Corona, MD;  Location: [facility];  Service: Gynecology;  Laterality: N/A;   DILATION AND CURETTAGE OF UTERUS     MASS EXCISION Left 11/06/2019   Procedure: EXCISION NIPPLE AND AREOLA  MARGIN LEFT BREAST;  Surgeon: Curvin Deward MOULD, MD;  Location: [facility];  Service: General;  Laterality: Left;   TONSILLECTOMY  1952   VIDEO BRONCHOSCOPY WITH ENDOBRONCHIAL NAVIGATION Bilateral 12/27/2020   Procedure: VIDEO BRONCHOSCOPY WITH ENDOBRONCHIAL NAVIGATION;  Surgeon: Brenna Adine CROME, DO;  Location: [facility];  Service: Pulmonary;  Laterality: Bilateral;   VIDEO BRONCHOSCOPY WITH ENDOBRONCHIAL ULTRASOUND Bilateral 12/27/2020   Procedure: VIDEO BRONCHOSCOPY WITH ENDOBRONCHIAL ULTRASOUND;  Surgeon: Brenna Adine CROME, DO;  Location: [facility];  Service: Pulmonary;  Laterality: Bilateral;   WISDOM TOOTH EXTRACTION      reports that she quit smoking about 17 years ago. Her smoking use included cigarettes. She started smoking about 37 years ago. She has a 2 pack-year smoking history. She has never used smokeless tobacco. She reports current alcohol use of about 14.0 standard drinks of alcohol per week. She reports that she does not use drugs. family history includes Brain cancer in her paternal uncle; Breast cancer (age of onset: 36) in her cousin; Breast cancer (age of onset: 25) in her maternal aunt and maternal aunt; Cancer in her maternal grandfather, maternal uncle, and another family member; Colon polyps in her mother; Depression in her brother; Diabetes in her mother; Heart disease in an other family member; Hyperlipidemia in her father and mother; Hypertension in her mother and another family member; Leukemia (age of onset: 25) in her  mother; Lung cancer in her father and maternal grandfather; Throat cancer in her paternal uncle; Uterine cancer in her paternal grandmother. Allergies[1]  Review of Systems  Constitutional:  Negative for malaise/fatigue.  Eyes:  Negative for blurred vision.  Respiratory:  Negative for shortness of breath.   Cardiovascular:  Negative for chest pain.  Gastrointestinal:  Negative for abdominal pain.  Skin:  Positive for rash.       See HPI  Neurological:  Negative for dizziness, weakness and headaches.      Objective:     BP 136/60   Pulse 71   Temp 97.6 F (36.4 C) (Oral)   Wt 186 lb 14.4 oz (84.8 kg)   SpO2 94%   BMI 33.11 kg/m  BP Readings from Last 3 Encounters:  10/30/24 136/60  09/27/24 (!) 151/68  08/28/24 122/62   Wt Readings from Last 3 Encounters:  10/30/24 186 lb 14.4 oz (84.8 kg)  09/27/24 184 lb 6.4 oz (83.6 kg)  08/28/24 185 lb 11.2 oz (84.2 kg)      Physical Exam Vitals reviewed.  Constitutional:      General: She is not in acute  distress.    Appearance: She is not ill-appearing.  HENT:     Ears:     Comments: She has some scaling in both external ear canals.  Remainder of ear exam is unremarkable.  Eardrums appeared normal.  No significant cerumen impaction. Cardiovascular:     Rate and Rhythm: Normal rate and regular rhythm.  Pulmonary:     Effort: Pulmonary effort is normal.     Breath sounds: Normal breath sounds. No wheezing or rales.  Neurological:     Mental Status: She is alert.      No results found for any visits on 10/30/24.  Last CBC Lab Results  Component Value Date   WBC 6.4 09/27/2024   HGB 13.2 09/27/2024   HCT 40.0 09/27/2024   MCV 88.9 09/27/2024   MCH 29.3 09/27/2024   RDW 14.0 09/27/2024   PLT 234 09/27/2024   Last metabolic panel Lab Results  Component Value Date   GLUCOSE 105 (H) 09/27/2024   NA 139 09/27/2024   K 4.3 09/27/2024   CL 106 09/27/2024   CO2 28 09/27/2024   BUN 23 09/27/2024   CREATININE 0.96  09/27/2024   GFRNONAA >60 09/27/2024   CALCIUM  8.8 (L) 09/27/2024   PROT 6.6 09/27/2024   ALBUMIN 4.0 09/27/2024   LABGLOB 2.0 07/31/2024   BILITOT 0.4 09/27/2024   ALKPHOS 59 09/27/2024   AST 14 (L) 09/27/2024   ALT 15 09/27/2024   ANIONGAP 5 09/27/2024   Last lipids Lab Results  Component Value Date   CHOL 98 (L) 07/31/2024   HDL 55 07/31/2024   LDLCALC 23 07/31/2024   LDLDIRECT 215.9 06/04/2011   TRIG 106 07/31/2024   CHOLHDL 1.8 07/31/2024   Last hemoglobin A1c Lab Results  Component Value Date   HGBA1C 6.4 (A) 04/24/2024      The ASCVD Risk score (Arnett DK, et al., 2019) failed to calculate for the following reasons:   The valid total cholesterol range is 130 to 320 mg/dL    Assessment & Plan:   #1 history of IBS.  Symptoms are somewhat intermittent.  She has used Librax  in the past.  We suggested not using alternatives such as glycopyrrolate with its listed cautions.  Even though there are multiple cautions listed with Librax  she has tolerated this well in the past.  She uses very infrequently for severe symptoms  #2 eczema involving both external ear canals.  Recommend trial of Elocon  lotion 2 drops external canal once daily as needed for flare-ups  #3 hyperlipidemia with history of CAD.  Goal LDL less than 55.  Recent LDL measured was 23 on Praluent  and rosuvastatin .  Continue close follow-up with cardiology  #4 history of hyperglycemia/type 2 diabetes.  A1c today 6.5% which is stable.  Continue low glycemic diet and try to step up to more consistent exercise-and recommend combination ideally of aerobic and resistance training  Wolm Scarlet, MD     [1]  Allergies Allergen Reactions   Taxotere  [Docetaxel ] Swelling   Abraxane  [Paclitaxel  Protein-Bound Part] Other (See Comments)    3 days after receiving she had one episode of very high BP and HR. This happened again 2 days later during the night.    Amlodipine      Caused IBS flare-up. Returned after  stopping and restarting med   Crestor  [Rosuvastatin  Calcium ] Itching    GI upset   Lipitor [Atorvastatin  Calcium ] Itching   Lisinopril  Cough   Losartan      Diffuse arthralgias and back pain. Returned  after stopping and restarting med   Nsaids Other (See Comments)    GI upset   Pravastatin  Itching    GI upset   Sulfonamide Derivatives Rash    Hives, itiching

## 2024-10-30 NOTE — Patient Instructions (Signed)
 A1C today 6.5% which is stable.  Continue low glycemic diet and try to establish more consistent exercise

## 2024-11-03 ENCOUNTER — Other Ambulatory Visit (HOSPITAL_COMMUNITY): Payer: Self-pay

## 2024-11-08 ENCOUNTER — Other Ambulatory Visit (HOSPITAL_COMMUNITY): Payer: Self-pay

## 2025-01-22 ENCOUNTER — Ambulatory Visit: Admitting: Internal Medicine

## 2025-03-27 ENCOUNTER — Inpatient Hospital Stay: Attending: Nurse Practitioner

## 2025-03-27 ENCOUNTER — Inpatient Hospital Stay: Admitting: Nurse Practitioner

## 2025-09-03 ENCOUNTER — Ambulatory Visit
